# Patient Record
Sex: Male | Born: 1940 | Race: White | Hispanic: No | Marital: Married | Smoking: Former smoker
Health system: Southern US, Community
[De-identification: ages and names within clinical notes are randomized; demographics above are authoritative.]

## PROBLEM LIST (undated history)

## (undated) DIAGNOSIS — K2100 Gastro-esophageal reflux disease with esophagitis, without bleeding: Secondary | ICD-10-CM

## (undated) DIAGNOSIS — G4733 Obstructive sleep apnea (adult) (pediatric): Secondary | ICD-10-CM

## (undated) DIAGNOSIS — N183 Chronic kidney disease, stage 3 unspecified: Secondary | ICD-10-CM

## (undated) DIAGNOSIS — I255 Ischemic cardiomyopathy: Secondary | ICD-10-CM

## (undated) DIAGNOSIS — E785 Hyperlipidemia, unspecified: Secondary | ICD-10-CM

## (undated) DIAGNOSIS — G629 Polyneuropathy, unspecified: Secondary | ICD-10-CM

## (undated) DIAGNOSIS — Z9861 Coronary angioplasty status: Secondary | ICD-10-CM

## (undated) DIAGNOSIS — E118 Type 2 diabetes mellitus with unspecified complications: Secondary | ICD-10-CM

## (undated) DIAGNOSIS — I2510 Atherosclerotic heart disease of native coronary artery without angina pectoris: Secondary | ICD-10-CM

## (undated) DIAGNOSIS — F5101 Primary insomnia: Secondary | ICD-10-CM

## (undated) DIAGNOSIS — I10 Essential (primary) hypertension: Secondary | ICD-10-CM

## (undated) HISTORY — DX: Morbid (severe) obesity due to excess calories: E66.01

## (undated) HISTORY — DX: Primary insomnia: F51.01

## (undated) HISTORY — DX: Gastro-esophageal reflux disease with esophagitis: K21.0

## (undated) HISTORY — DX: Chronic kidney disease, stage 3 (moderate): N18.3

## (undated) HISTORY — DX: Hyperlipidemia, unspecified: E78.5

## (undated) HISTORY — PX: CARDIAC CATHETERIZATION: SHX172

## (undated) HISTORY — DX: Obstructive sleep apnea (adult) (pediatric): G47.33

## (undated) HISTORY — DX: Chronic kidney disease, stage 3 unspecified: N18.30

## (undated) HISTORY — DX: Gastro-esophageal reflux disease with esophagitis, without bleeding: K21.00

## (undated) HISTORY — DX: Essential (primary) hypertension: I10

## (undated) HISTORY — DX: Polyneuropathy, unspecified: G62.9

---

## 1998-03-05 ENCOUNTER — Ambulatory Visit (HOSPITAL_COMMUNITY): Admission: RE | Admit: 1998-03-05 | Discharge: 1998-03-06 | Payer: Self-pay | Admitting: Cardiology

## 1998-03-05 ENCOUNTER — Encounter: Payer: Self-pay | Admitting: Cardiology

## 1998-09-25 ENCOUNTER — Observation Stay (HOSPITAL_COMMUNITY): Admission: AD | Admit: 1998-09-25 | Discharge: 1998-09-26 | Payer: Self-pay | Admitting: Cardiology

## 2000-10-11 ENCOUNTER — Ambulatory Visit: Admission: RE | Admit: 2000-10-11 | Discharge: 2000-10-11 | Payer: Self-pay | Admitting: Family Medicine

## 2002-08-10 ENCOUNTER — Ambulatory Visit (HOSPITAL_COMMUNITY): Admission: RE | Admit: 2002-08-10 | Discharge: 2002-08-10 | Payer: Self-pay | Admitting: Cardiology

## 2004-05-18 ENCOUNTER — Ambulatory Visit: Payer: Self-pay | Admitting: Cardiology

## 2005-05-31 ENCOUNTER — Ambulatory Visit: Payer: Self-pay | Admitting: Cardiology

## 2005-06-14 ENCOUNTER — Ambulatory Visit: Payer: Self-pay | Admitting: Cardiology

## 2006-01-31 ENCOUNTER — Ambulatory Visit: Payer: Self-pay | Admitting: Cardiology

## 2006-02-18 ENCOUNTER — Ambulatory Visit: Payer: Self-pay | Admitting: Cardiology

## 2006-03-01 ENCOUNTER — Ambulatory Visit: Payer: Self-pay | Admitting: Cardiology

## 2006-05-25 ENCOUNTER — Ambulatory Visit: Payer: Self-pay | Admitting: Cardiology

## 2006-05-30 ENCOUNTER — Inpatient Hospital Stay (HOSPITAL_COMMUNITY): Admission: AD | Admit: 2006-05-30 | Discharge: 2006-05-31 | Payer: Self-pay | Admitting: Cardiology

## 2006-05-30 ENCOUNTER — Inpatient Hospital Stay (HOSPITAL_BASED_OUTPATIENT_CLINIC_OR_DEPARTMENT_OTHER): Admission: RE | Admit: 2006-05-30 | Discharge: 2006-05-30 | Payer: Self-pay | Admitting: Cardiology

## 2006-05-30 ENCOUNTER — Ambulatory Visit: Payer: Self-pay | Admitting: Cardiology

## 2006-06-16 ENCOUNTER — Ambulatory Visit: Payer: Self-pay | Admitting: Cardiology

## 2007-08-08 ENCOUNTER — Ambulatory Visit: Payer: Self-pay | Admitting: Cardiology

## 2007-09-08 ENCOUNTER — Ambulatory Visit: Payer: Self-pay | Admitting: Cardiology

## 2007-09-08 ENCOUNTER — Inpatient Hospital Stay (HOSPITAL_COMMUNITY): Admission: AD | Admit: 2007-09-08 | Discharge: 2007-09-10 | Payer: Self-pay | Admitting: Cardiology

## 2007-09-12 ENCOUNTER — Ambulatory Visit: Payer: Self-pay | Admitting: Cardiology

## 2007-09-12 ENCOUNTER — Ambulatory Visit (HOSPITAL_COMMUNITY): Admission: RE | Admit: 2007-09-12 | Discharge: 2007-09-12 | Payer: Self-pay | Admitting: Cardiology

## 2007-09-26 ENCOUNTER — Ambulatory Visit: Payer: Self-pay | Admitting: Cardiology

## 2008-08-15 ENCOUNTER — Ambulatory Visit: Payer: Self-pay | Admitting: Cardiology

## 2008-08-16 ENCOUNTER — Encounter: Payer: Self-pay | Admitting: Cardiology

## 2009-01-16 DIAGNOSIS — E782 Mixed hyperlipidemia: Secondary | ICD-10-CM | POA: Insufficient documentation

## 2009-01-16 DIAGNOSIS — E785 Hyperlipidemia, unspecified: Secondary | ICD-10-CM

## 2009-01-16 DIAGNOSIS — R0602 Shortness of breath: Secondary | ICD-10-CM | POA: Insufficient documentation

## 2009-01-16 DIAGNOSIS — E663 Overweight: Secondary | ICD-10-CM | POA: Insufficient documentation

## 2009-02-27 ENCOUNTER — Ambulatory Visit (HOSPITAL_COMMUNITY): Payer: Self-pay | Admitting: Psychology

## 2009-03-18 ENCOUNTER — Ambulatory Visit (HOSPITAL_COMMUNITY): Payer: Self-pay | Admitting: Psychology

## 2010-09-01 NOTE — Discharge Summary (Signed)
Noah [name], Noah [name]                ACCOUNT NO.:  [id number]   MEDICAL RECORD NO.:  [id number]          PATIENT TYPE:  INP   LOCATION:  4713                         FACILITY:  [facility]   PHYSICIAN:  Madolyn Frieze. Jens Som, MD, FACCDATE OF BIRTH:  July 24, 1940   DATE OF ADMISSION:  09/08/2007  DATE OF DISCHARGE:                         DISCHARGE SUMMARY - REFERRING   DISCHARGE DIAGNOSES:  1. Chest discomfort of uncertain etiology but without evidence of      acute cardiac ischemia by enzymes.  2. Hypertension.  3. Hyperglycemia with a history of diabetes, hyperlipidemia, history      as noted per history and physical dictated on Sep 08, 2007.   ATTENDING PHYSICIAN:  Dr. Andee Lineman.   DISCHARGING PHYSICIAN:  Dr. Jens Som.   HISTORY:  This is a 70 year old morbidly obese male who presents to [facility] with reoccurring chest discomfort on the morning of admission.  While sitting at work, he developed 7 out of 10 substernal chest  pressure associated with nausea, similar to his myocardial infarction  that he had in 1997 and 1999.  He denied associated shortness of breath,  diaphoresis or radiation.  He took over-the-counter antacids without  relief and drove home.  His discomfort resolved within 45 minutes after  onset.  He drove to [facility] for further evaluation.  EKG  showed chronic left bundle-branch block.  His medical history is  extensive.  Please refer to dictated H&P from Sep 08, 2007.   LABORATORY DATA:  At [facility],  H&H on the 23rd was 13 and 38.2,  normal indices.  Platelets 159, WBCs 7.8.  PTT 116, PT 13, D-dimer was  0.73, sodium 140, potassium 3.9, BUN 21, creatinine 1.19, glucose 130,  normal LFTs.  Albumin was slightly low at 3.3.  CK-MBs, relative  indexes, and troponins were within normal limits x3.  Fasting lipids  showed a total cholesterol of 121, triglycerides 92, HDL 28, LDL 75, and  TSH 1.736.  Chest x-ray at [facility] on the 23rd showed linear  opacity at the left lung base likely representative of atelectasis but  could not exclude early infiltrate, right lower lobe atelectasis,  central venous pulmonary congestion.  EKGs showed sinus rhythm, left  axis deviation, left bundle-branch block.   HOSPITAL COURSE:  Noah [name] was transferred from [facility] for  further evaluation and cardiac catheterization given his chest  discomfort and history.  He was transferred on the 22nd.  Overnight he  did not have any further problems.  Pharmacy assisted with heparin.  On  the 24th, he still had not had any chest discomfort or shortness of  breath.  Enzymes had ruled out myocardial infarction.  Dr. Jens Som on  reevaluating the patient on the 24th after being seen by Dr. Andee Lineman on  the 25th stating that potentially he could be discharged home with  outpatient catheterization on Tuesday.  Dr. Jens Som agreed.  Thus, the  patient was discharged home.   DISPOSITION:  Noah [name] is discharged home.  He is asked to curtail his  usual activities, particularly regards to lifting, driving and sexual  activity until his chest discomfort is further evaluated.  He was asked  to maintain a low-sodium, heart-healthy ADA diet and to return to the  emergency room for further discomfort.   HOME MEDICATIONS:  His home medications will remain the same.  These  include:  1. Aspirin 325 mg daily.  2. Plavix 75 mg daily.  3. Zocor 40 mg q.h.s.  4. Niacin 500 mg daily.  5. Toprol XL 200 mg daily.  6. Altace 5 mg daily.  7. Flomax 0.8 mg daily.  8. Lopid 600 mg b.i.d.  9. Glyburide 5 mg daily.  10.Multivitamin daily.  11.Fish oil 1 tablet b.i.d.  12.Zantac 150 q.h.s.  13.Actos 45 mg daily.  14.Lasix 60 mg daily.  15.Imdur 30 mg daily.  16.Coenzyme Q-10 daily.  17.Nitroglycerin as needed.   He is asked to report to short-stay section C at 9:00 a.m. for cardiac  catheterization and advised nothing to eat or drink after midnight  except for his  medications that he may take with water.  He was advised  to bring all medications to all appointments.   I have left outpatient pre-catheterization orders as well as his med  list within the cath lab for Ricky Ala to receive on Tuesday morning.  Discharge time 45 minutes.      Joellyn Rued, PA-C      Madolyn Frieze Jens Som, MD, [facility]  Electronically Signed    EW/MEDQ  D:  09/10/2007  T:  09/10/2007  Job:  [id number]   cc:   Gwynneth Munson, MD,FACC

## 2010-09-01 NOTE — H&P (Signed)
[name], [name]                ACCOUNT NO.:  [id number]   MEDICAL RECORD NO.:  [id number]          PATIENT TYPE:  INP   LOCATION:  4713                         FACILITY:  [facility]   PHYSICIAN:  Noah Rotunda, MD, FACCDATE OF BIRTH:  1940/05/31   DATE OF ADMISSION:  09/08/2007  DATE OF DISCHARGE:                              HISTORY & PHYSICAL   PRIMARY CARDIOLOGIST:  Noah Codding, MD, [facility]   PRIMARY CARE Noah [name]:  Noah [name], M.D.   PATIENT PROFILE:  A 70 year old morbidly obese male with prior history  of CAD status post multiple MIs who presents with recurrent chest pain.   PROBLEMS:  1. Unstable angina/coronary artery disease in January, 2008.  1A.  Status post myocardial infarction in 1997.  1B.  Status post myocardial infarction in 1999.  1C. Status post percutaneous coronary intervention of the left anterior  descending and right coronary artery with bare-metal stent to the  proximal mid left anterior descending and NIR bare-metal stents to the  proximal distal right coronary artery in 1999.  1D.  On May 30, 2006, cardiac catheterization, percutaneous  coronary intervention, left main normal, left anterior descending 10% in-  stent restenosis, left circumflex 90% mid.  Subsequently treated with a  3.5 x 15-mm endeavor drug-eluting stent, right coronary artery 50% mid  in-stent restenosis; 80% mid-to-distal (highly calcified; 40% distal in-  stent restenosis, ejection fraction 35-40%).  1. Ischemic cardiomyopathy, ejection fraction 35-40%.  2. Chronic systolic congestive heart failure.  3. Morbid obesity.  4. Hyperlipidemia/hypertriglyceridemia times approximately 12 years.  5. Diabetes mellitus with peripheral neuropathy in 1986.  6. Gastroesophageal reflux disease.  7. Hiatal hernia.  8. Benign prostatic hyperplasia.  9. Obstructive sleep apnea, on continuous positive airway pressure.  10.Chronic left bundle-branch block.  11.Mild chronic renal  insufficiency.  12.Remote tobacco abuse with a 50-pack-year history, quitting about 10      years ago.  13.Status post knee surgery.  14.Status post tonsillectomy.  15.Status post hernia repair.  16.Status post cyst removal.  17.Chronic dyspnea.  18.Osteoarthritis.   HISTORY OF PRESENT ILLNESS:  A 70 year old morbidly obese male with  history of CAD and MI in 1997 and 1999 with multiple stents to LAD, RCA  and most recently left circumflex in February 2008.  He also has a  history of hiatal hernia and GERD, and has chest pain, indigestion  approximately two times a month lasting 10 minutes resolving with  antacid therapy.  He was otherwise in his usual state of health for  approximately 10:30 this morning when he was sitting at work and  developed 7/10 substernal chest pressure with mild nausea, somewhat  similar to pain at the time of his MIs in 97 and 99.  He had no  shortness of breath, diaphoresis, or radiation.  He took over-the-  counter antacids without relief and then drove home.  While driving, his  pain resolved approximately 45 minutes after onset.  After getting home,  he drove to [facility], where he was pain free and cardiac  markers were negative x1.  ECG shows chronic left bundle-branch block.  No acute changes.  He has had no additional chest pain and is  transferred to [facility] for further evaluation.  Currently, his pain-free.   ALLERGIES:  ERYTHROMYCIN and CODEINE.   HOME MEDICATIONS:  1. Zocor 40 mg daily.  2. Niacin 500 mg daily.  3. Toprol-XL 200 mg daily.  4. Altace 5 mg daily.  5. Flomax 0.4 mg 2 tablets daily.  6. Gemfibrozil 600 mg b.i.d.  7. Glyburide 5 mg daily.  8. Lasix 60 mg daily.  9. Aspirin 325 mg daily.  10.Fish oil 1 tablet b.i.d.  11.Multivitamin daily.  12.Nexium 40 mg daily.  13.Zantac 150 mg nightly.  14.Plavix 75 mg daily.  15.Imdur 30 mg daily.  16.Coenzyme Q10 daily.  17.Actos 45 mg daily.   FAMILY HISTORY:  Mother is  alive at age 100 with valvular disease.  Father died at age 12 of an MI.  He has two brothers.  One has cancer,  they both have diabetes.  No CAD or stroke.   SOCIAL HISTORY:  He lives in [place] with his wife.  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  He has three grown children  and one Museum/gallery conservator.  He has about 50 pack-year history tobacco abuse  and quit cigarettes about 10 years ago.  Occasionally, will have a cigar  maybe about twice a year.  He has an occasional drink, may be once a  month.  Denies any drug use.  He exercises on a recumbent bike two to  three times per week for 20 minutes of the time.   REVIEW OF SYSTEMS:  Positive for chest pain, history of indigestion, and  GERD symptoms.  He also has a history diabetes as outlined above.  Otherwise, all systems reviewed and negative.  He is a full code.   PHYSICAL EXAMINATION:  VITAL SIGNS:  He is afebrile, heart rate 72,  respirations 18, blood pressure 126/68, pulse ox 99% on 2 liters.  GENERAL:  Pleasant obese white male in no acute distress.  Awake, alert,  and oriented x3.  HEENT:  Normal.  Nares grossly intact and nonfocal.  SKIN:  Warm and dry without lesions or masses.  NECK:  Obese and difficult to asses JVP.  No bruits.  LUNGS:  Respirations are unlabored.  Clear to auscultation.  CARDIAC:  Regular S1 and S2.  No S3 or S4 or murmurs.  ABDOMEN:  Obese, soft, nontender, and nondistended.  Bowel sounds  present.  EXTREMITIES:  Warm, dry, and pink.  No clubbing, cyanosis, or edema.  Dorsalis pedis and posterior tibialis 2+ and equal bilaterally.   DIAGNOSTIC DATA:  Chest x-ray is pending.  EKG shows sinus rhythm and  rate of 91 beats per minute, left axis deviation, and left bundle-branch  block.  No acute ST-T changes.   LABORATORY DATA:  Hemoglobin 14.5, hematocrit 43.1,WBC 6.8, platelets  198, sodium 137, potassium 4.0, chloride 97, CO2 26.1, BUN 23,  creatinine 1.2, glucose 166, calcium  9.5, INR 0.9, CK 130, MB 2.2, and  troponin-I less than 0.0.   ASSESSMENT AND PLAN:  1. Unstable angina/coronary artery disease symptoms concerning for      angina.  The patient feels that these are different from his usual      indigestion and similar to myocardial infarction pain.  Plan to      admit and cycle cardiac enzymes.  Add heparin and otherwise      continue aspirin, Plavix, statin, beta-blocker, ACE inhibitor, and  nitrate.  Plan cath on Tuesday or sooner if the patient has      recurrent pain.  2. Ischemic cardiomyopathy/chronic systolic congestive heart failure.      The patient euvolemic on exam.  He does not weigh himself at home.      He does require congestive heart failure education.  Continue beta-      blocker, ACE inhibitor, and Lasix.  3. Diabetes mellitus.  Continue home regimen.  Check CBGs and add      sliding scale insulin.  4. Gastroesophageal reflux disease and hiatal hernia.  Continue proton      pump inhibitor and H2 blocker.  5. Morbid obesity.  The patient is considering bariatric surgery at      some point.  Have him seen by cardiac rehab.  6. Benign prostatic hyperplasia.  Continue Flomax.  7. Obstructive sleep apnea.  Continue continuous positive airway      pressure.  8. History of mild renal insufficiency, creatinine appears stable at      1.2.      Noah [name], ANP      Noah Rotunda, MD, [facility]  Electronically Signed    CB/MEDQ  D:  09/08/2007  T:  09/09/2007  Job:  [id number]

## 2010-09-01 NOTE — Assessment & Plan Note (Signed)
[facility]                          [facility] OFFICE NOTE   NAME:[name], [name]                       MRN:          [id number]  DATE:09/26/2007                            DOB:          [birth year]    HISTORY OF PRESENT ILLNESS:  The patient is a 70 year old male with  morbid obesity and weakness secondary to deconditioning.  The patient  underwent a recent catheterization for worsening chest pain.  He was  found to have an LV function of 50%, which was improved from prior, and  also was found to have essentially nonobstructive disease with no  significant restenosis in prior stent sites.  The patient states he is  doing well.  He denies any chest pain, orthopnea, PND.  He has not had  palpitations or syncope.   MEDICATIONS:  1. Aspirin 325 mg p.o. daily.  2. Plavix 75 mg p.o. daily.  3. Zocor 40 mg p.o. daily.  4. Niacin 5 mg p.o. daily.  5. Toprol XL 20 mg p.o. daily.  6. Altace 5 mg p.o. daily.  7. Flomax 0.5 mg p.o. daily.  8. Lopid 600 mg p.o. b.i.d.  9. Glyburide 5 mg p.o. daily.  10.Multivitamins.  11.Fish oil.  12.Zantac.  13.Actos 45 mg p.o. daily.  14.Lasix 60 mg p.o. daily.  15.Imdur 10 mg p.o. daily.  16.Coenzyme Q10 daily.   PHYSICAL EXAMINATION:  VITAL SIGNS:  Blood pressure 150/71, heart rate  76, weight 338 pounds.  NECK:  Normal carotid strokes. No carotid bruits.  LUNGS:  Clear breath sounds bilaterally.  HEART:  Regular rate and rhythm.  Normal S1, S2.  No murmurs, rubs, or  gallops.  ABDOMEN:  Soft, nontender.  No rebound or guarding.  Good bowel sounds.  EXTREMITIES:  No cyanosis, clubbing or edema.  NEUROLOGIC:  Patient alert and grossly nonfocal.   PROBLEMS:  1. Exertional dyspnea secondary to obesity.  2. Coronary artery disease.      a.     Status post prior percutaneous coronary intervention.      b.     Recent catheterization with patent stent to the left       anterior descending and right coronary  artery.      c.     Near normal left ventricular function estimated at 50% by       catheterization.  3. Normalized renal function.  4. Type 2 diabetes mellitus.  5. Dyslipidemia.  6. Morbid obesity.  7. Remote tobacco use.   PLAN:  1. The patient has been cleared for bariatric surgery. Recent      catheterization showed nonobstructive coronary artery disease.  2. The patient's LV function also has markedly improved, and no      further adjustments in medications required.  3. The patient can follow up with [place] in 6 months.     Learta Codding, MD,FACC  Electronically Signed    GED/MedQ  DD: 09/26/2007  DT: 09/26/2007  Job #: [id number]

## 2010-09-01 NOTE — Assessment & Plan Note (Signed)
[facility]                          [facility] NOTE   NAME:[name], [name]                       MRN:          [id number]  DATE:08/08/2007                            DOB:          December 03, 1940    HISTORY OF PRESENT ILLNESS:  The patient is a 70 year old male, morbidly  obese with significant dyspnea and deconditioning, secondary to his  underlying obesity.  The patient does have coronary artery disease and  has been stented on multiple occasions.  He does fortunately, however  has no chest pain, and he has chronic dyspnea related to the above.  The  patient is now choosing bariatric surgery.   MEDICATIONS:  List is extensive and is listed in the chart.  He is  planning to have blood work done by Dr. Neita Carp tomorrow.  I pointed out  his elevated blood pressure, but he states that previously it has been  normal.  I have asked him to get a blood pressure cuff.   PROBLEMS:  1. Exertional dyspnea secondary to obesity.  2. Coronary artery disease.  3. LV dysfunction, ejection fraction 40%.  4. Mild renal insufficiency.  5. Chronic __________.  6. Type 2 diabetes mellitus.  7. Dyslipidemia.  8. Morbid obesity.  9. Remote tobacco use.   PLAN:  1. I told the patient that the best thing we could do for him right      now is to refer him for a evaluation for bariatric surgery.  2. From a cardiovascular standpoint, the patient is stable.  3. I asked the patient to check with Dr. Neita Carp regarding his blood      pressure and to obtain a blood pressure cuff, as he may need      additional treatment for hypertension.     Noah Codding, MD,FACC  Electronically Signed    GED/MedQ  DD: 08/08/2007  DT: 08/08/2007  Job #: [id number]

## 2010-09-01 NOTE — Assessment & Plan Note (Signed)
 [facility]                          [facility] NOTE   NAME:Noah [name], Noah [name]                       MRN:          [id number]  DATE:08/15/2008                            DOB:          1941/03/03    The patient is a 70 year old male with obesity and coronary artery  disease.  The patient is status post right coronary intervention.  The  patient was laid off from work.  He is somewhat depressed about this;  however, he does not have any chest pain or shortness of breath.  He  feels stable from a cardiovascular perspective.  He has had no angina.   MEDICATIONS:  1. Aspirin 325 mg p.o. daily.  2. Plavix 75 mg p.o. daily.  3. Zocor 40 mg p.o. daily.  4. Niacin 500 mg p.o. daily.  5. Altace 5 mg p.o. daily.  6. Flomax 0.8 mg p.o. daily.  7. Lopid 600 mg p.o. daily.  8. Glyburide 5 mg p.o. daily.  9. Multivitamin.  10.Fish oil.  11.Zantac 150 mg p.o. daily.  12.Actos 45 mg p.o. daily.  13.Lasix 60 mg p.o. daily.  14.Imdur 30 mg p.o. daily.  15.Coenzyme Q10.  16.Metoprolol 100 mg p.o. b.i.d.   PHYSICAL EXAMINATION:  VITAL SIGNS:  Blood pressure 150/81, heart rate  78, weight 240 pounds.  NECK:  Normal carotid upstroke.  No carotid bruits.  LUNGS:  Clear breath sounds bilaterally.  HEART:  Regular rate and rhythm.  Normal S1 and S2.  ABDOMEN:  Soft, nontender.  No rebound or guarding.  Good bowel sounds.  EXTREMITIES:  No cyanosis, clubbing, or edema.  NEUROLOGIC:  The patient is alert, oriented, and grossly nonfocal.   PROBLEMS:  1. Exertional dyspnea secondary to obesity.  2. Coronary artery disease.      a.     Status post right coronary intervention.      b.     Stable symptoms.  3. Recent catheterization last year with patent stents to left      anterior descending and right coronary artery.  4. Near normal left ventricular function.  Ejection fraction 50%.  5. Type 2 diabetes mellitus.  6. Dyslipidemia.  7. Morbid obesity.  8.  Tobacco use.   PLAN:  1. From cardiovascular perspective, the patient is stable.  He reports      no chest pain.  No exercise testing or functional testing is      needed.  2. I have made no changes in the patient's medical regimen and will      follow them up in 6 months.  His EKG was reviewed and demonstrates      a left bundle-branch block which is chronic.     Learta Codding, MD,FACC  Electronically Signed    GED/MedQ  DD: 08/15/2008  DT: 08/16/2008  Job #: [id number]

## 2010-09-04 NOTE — Assessment & Plan Note (Signed)
[facility]                            [facility] NOTE   NAME:Noah [name]                       MRN:          [id number]  DATE:03/01/2006                            DOB:          01/14/1941    HISTORY OF PRESENT ILLNESS:  The patient is a 70 year old male with known  coronary artery disease.  The patient is status post multiple percutaneous  interventions to LAD and right coronary artery.  His ejection fraction is  45%.  He was referred recently for a Dobutamine echocardiographic study but  the technician was unable to obtain good echocardiographic windows.  Baseline echocardiographic study did demonstrate possibly mild LV  dysfunction but was otherwise non-diagnostic.  The patient stated he has no  chest pain but he is very short of breath, has gained a lot of weight.  He  is trying to be on an exercise program.  He is also interested in see a  dietitian.  He also requested to discuss the possibility of gastric bypass  surgery versus banding.  He denies any orthopnea or PND.  He has no  pulsations or syncope.   MEDICATIONS:  1. Lopressor 10 mg p.o. q.a.m.  2. Niacin 500 mg p.o. q.a.m.  3. Toprol XL 200 mg p.o. q.a.m.  4. Altace 5 mg q.p.m.  5. Flomax 0.4.  6. Gemfibrozil.  7. Avandia.  8. Glyburide.  9. Lasix  10.Co-Enzyme Q10.  11.Fish oil.  12.Multivitamin.  13.Nexium.   PHYSICAL EXAMINATION:  VITAL SIGNS:  Blood pressure 115/78, heart rate 76  beats per minute. Weight 237 pounds.  NECK:  Normal carotid upstroke, no carotid bruits.  LUNGS:  Clear, breath sounds normal.  HEART:  Regular rate and rhythm, normal sinus.  ABDOMEN:  Soft.  EXTREMITIES:  No cyanosis, clubbing or edema.  NEURO:  The patient is alert and oriented, grossly nonfocal.   PROBLEM LIST:  1. Chronic exertional dyspnea.  2. Coronary artery disease.      a.     Moderate fixed inferior defect.  Ejection fraction 37% by       Cardiolite in May  2005.      b.     Status post inferior myocardial infarction.      c.     Status post multiple percutaneous interventions to LAD and right       coronary artery.      d.     Non-obstructive disease, ejection fraction 45% by       catheterization in April 2004.  3. History of mild renal insufficiency.  4. Chronic left bundle branch block.  5. Type 2 diabetes mellitus.  6. Hyperlipidemia.  7. Morbid obesity.  8. Remote tobacco use.   PLAN:  1. The patient is interested in seeing a dietitian.  We have arranged for      him to have this done in [place].  2. I have asked the patient to get a heart rate monitor and he will go on      the exercise bike and try to exercise at least 1/2 hour 3 times  a week.      I discouraged him from gastric surgery and asked him to make an attempt      at losing weight by cutting his carbs and seeing a dietitian and go on      the exercise bike.  3. The patient will need a repeat stress study and I have ordered an      Adenosine Cardiolite with 4 minute protocol.     Learta Codding, MD,FACC  Electronically Signed    GED/MedQ  DD: 03/01/2006  DT: 03/01/2006  Job #: [id number]

## 2010-09-04 NOTE — Assessment & Plan Note (Signed)
[facility]                            [facility]   NAME:Noah [name], Noah [name]                       MRN:          [id number]  DATE:01/31/2006                            DOB:          08-05-1940    REASON FOR OFFICE VISIT:  Scheduled 53-month followup.   Mr. Imbert is a 70 year old male with known coronary artery disease, who now  returns for scheduled followup.  Since last seen here in the clinic in  February of this year, by Dr. Andee Lineman, patient reports chronic exertional  dyspnea, but with no significant exacerbation.  However, he has gained a  significant amount of weight (up 16 pounds) and is currently considering  gastric banding versus gastric bypass surgery.  He has begun to make some  initial inquiries into this.   Although he is somewhat limited in his mobility, he does try to exercise,  and is currently using a stationary bike at home.  There is no associated  chest discomfort.  At other times, however, he does have occasional chest  pain which may be related to his known reflux disease.  These are not  strictly exertional, and there has been no increase in frequency of these as  well.   The patient's last stress test was an adenosine perfusion study in May 2005,  revealing a moderate fixed inferior defect with no definite ischemia;  calculated ejection fraction of 37%.   His last catheterization in 2004 showed nonobstructive coronary artery  disease.  Electrocardiogram today reveals NSR at 74 BPM with chronic left  bundle branch block.   CURRENT MEDICATIONS:  1. Crestor 10 q.d.  2. Niacin 500 q.d.  3. Toprol XL 200 q.d.  4. Altace 5 q.d.  5. Flomax 0.4 q.d.  6. Gemfibrozil 600 b.i.d.  7. Avandia 4 q.d.  8. Glyburide 5 q.d.  9. Lasix 40 q.d.  10.Coenzyme 210 one hundred q.d.  11.Quarter aspirin 325 q.d.  12.Fish oil 1 capsule b.i.d.  13.Nexium 40 q.d.   PHYSICAL EXAMINATION:  VITAL SIGNS:  Blood pressure 152/70.   Pulse 74,  regular.  Weight 334 (up 16 pounds).  GENERAL:  A 70 year old male, morbidly obese, sitting upright, in no  apparent distress.  NECK:  Palpable bilateral carotid pulses without bruits; unable to assess  JVD secondary to neck girth.  LUNGS:  Diminished breath sounds at the bases, but without crackles or  wheezes.  HEART:  Regular rate and rhythm (S1 and S2).  No significant murmurs.  ABDOMEN:  Markedly protuberant, non-tender.  EXTREMITIES:  Chronic 2+ bilateral lower extremity nonpitting edema.  NEUROLOGIC:  No focal deficits.   IMPRESSION:  1. Chronic exertional dyspnea.  2. Coronary artery disease.      a.     Moderate fixed inferior defect; no definite reversibility,       ejection fraction 37% May 2005.      b.     Status post prior inferior wall myocardial infarction.      c.     Status post multiple percutaneous interventions of the left  anterior descending and right coronary artery:  Nonobstructive       coronary artery disease with ejection fraction of 45% by cardiac       catheterization April 2004.  3. History of mild renal insufficiency.  4. Chronic left bundle branch block.  5. Type 2 diabetes mellitus.  6. Hyperlipidemia.  7. Morbid obesity.  8. Remote tobacco.   PLAN:  1. Schedule a dobutamine stress echocardiogram for reassessment of left      ventricular function and for risk stratification.  2. We will have patient return to our clinic in 1 month for followup of      these results and further recommendations.      ______________________________  Rozell Searing, PA-C    ______________________________  Learta Codding, MD,FACC    GS/MedQ  DD:  01/31/2006  DT:  02/01/2006  Job #:  [id number]   cc:   Fara Chute

## 2010-09-04 NOTE — Cardiovascular Report (Signed)
NAME:  Noah [name], Noah [name]                ACCOUNT NO.:  [id number]   MEDICAL RECORD NO.:  [id number]          PATIENT TYPE:  OIB   LOCATION:  1967                         FACILITY:  [facility]   PHYSICIAN:  Bruce R. Juanda Chance, MD, FACCDATE OF BIRTH:  1941/02/11   DATE OF PROCEDURE:  DATE OF DISCHARGE:  05/30/2006                            CARDIAC CATHETERIZATION   PAST MEDICAL HISTORY:  Noah [name] is 70 years old and has known coronary  disease with previous radial stent placed in the LAD and 2 NIR stents  placed in the mid and distal right coronary artery.  He also has  diabetes.  He usually has symptoms of shortness of breath on exertion  and was referred for evaluation with angiography.  This was the  performed downstairs, and we found what appeared to be a tight lesion in  the mid circumflex artery just before its bifurcation into a marginal  and posterolateral branch.  We brought him up to the cath lab for  intervention.  There was also a lesion in the mid right coronary artery,  which was intermediate, which we had eventually planned to evaluate with  IVUS.   PROCEDURES:  The procedure was performed through the right femoral  arterial sheath and a 6-French CLS4 guiding catheter with sideholes.  The patient was given Angiomax bolus and infusion.  He had been  previously loaded with clopidogrel and aspirin.  We navigated the  Prowater wire down the circumflex artery into the marginal branch first,  and then we navigated the second Prowater wire down the circumflex into  the AV branch which fed a posterolateral branch.  We then performed IVUS  of the lesion into the marginal branch with automatic pullback.  This  demonstrated a distal reference was about 3.5 to 3.75 and proximal  reference was 12.0.  The lesion was quite unusual.  At its tightest  point, which was right where the lesion bifurcated, it appeared 2.0 x  2.0.  Just proximal to that, it appeared that there was a shelf with  blood on  both sides of the shelf with the appearance of a ruptured  plaque.  We then went in with a 3.5 x 15-mm Quantum Maverick and  performed 2 inflations of 10 atmospheres for 30 seconds.  We then went  in with a 3.5 x 15-mm Endeavor stent and employed this as one inflation  of 12 atmospheres for 30 seconds.  We post dilated the stent with a 4.0  x 12-mm Quantum Maverick and performed 2 inflations up to 14 atmospheres  for 30 seconds.  Final diagnostic studies were then performed through  the guiding catheter.   We took additional diagnostic studies of the right coronary but elected  not to evaluate this with IVUS partly because the lesion was very  difficult to treat and partly because we thought it probably was not  significant and partly because the patient was very uncomfortable from  lying on the table for a fairly long period of time.   Although the patient had quite a bit of back pain, he otherwise  tolerated  the procedure well and left the cath lab in satisfactory  condition with the following results.   Initially, the stenosis at the mid circumflex artery was estimated at  90%; following stenting, it has improved to 0%.  The side branch which  was the AV circumflex artery and __________ posterolateral branch had  initially about 50% stenosis and was about 60% stenosis after stenting.   CONCLUSIONS:  Successful PCI of the lesion in the mid circumflex artery  using an Endeavor drug-eluting stent with improvement of the narrowing  from 90% to 0%.   DISPOSITION:  The patient returned to PCU  for further observation.  We  will plan Plavix for 1 year.      Bruce Elvera Lennox Juanda Chance, MD, [facility]  Electronically Signed     BRB/MEDQ  D:  05/30/2006  T:  05/31/2006  Job:  [id number]   cc:   Gwynneth Munson, MD,FACC

## 2010-09-04 NOTE — Cardiovascular Report (Signed)
NAME:  Noah [name], Noah [name]                ACCOUNT NO.:  [id number]   MEDICAL RECORD NO.:  [id number]          PATIENT TYPE:  OIB   LOCATION:  NA                           FACILITY:  [facility]   PHYSICIAN:  Bruce R. Juanda Chance, MD, FACCDATE OF BIRTH:  06-16-1940   DATE OF PROCEDURE:  05/30/2006  DATE OF DISCHARGE:                            CARDIAC CATHETERIZATION   PRIMARY CARE PHYSICIAN:  Dr. Fara Chute in [place].   CARDIOLOGIST:  Dr. Lewayne Bunting in [place].   CLINICAL HISTORY:  Noah [name] is 70 years old and has known coronary  artery disease and has had a Radius stent placed to the LAD and two near  stents placed in the mid and distal right coronary artery.  He recently  developed increasing symptoms of shortness of breath.  He was seen in  consultation by Dr. Andee Lineman who arranged for him to come to the  outpatient laboratory for evaluation with angiography.  He is also  diabetic.  He also has gained quite a bit of weight over the last year  or two and weighs 330 pounds.   PROCEDURE:  The procedure was performed via the right femoral artery  using arterial sheath and 5-French preformed coronary catheters.  We  used a JL-5 for injection of the left coronary artery, and we used a  right bypass graft catheter for injection of the right coronary artery.  After completion of the left heart catheterization, we decided to a  right heart catheterization as well.  This was performed via the right  femoral vein using a venous sheath and Swan-Ganz thermodilation  catheter.  The patient tolerated the procedure well and left the  laboratory in satisfactory condition.   RESULTS:  The left main coronary:  The left main coronary artery is free  of significant disease.   The left anterior descending artery:  The left anterior descending  artery gave rise to a large diagonal branch and two septal perforators.  The LAD was irregular, but there was no major obstruction.  The stent in  the proximal LAD had less than  10% narrowing.   Circumflex coronary artery:  The circumflex coronary artery gave rise to  an atrial branch, a marginal branch, and two large posterolateral  branches.  There was 90% stenosis just before the bifurcation into two  large posterolateral branches in the mid- circumflex artery.  There was  50% narrowing just past the ostium of the AV branch after the first  posterolateral branch and 40% narrowing in the second posterolateral  branch.   The right coronary artery:  The right coronary artery is a moderate-  sized vessel that was heavily calcified.  The right coronary gave rise  to two ventricle branches, a posterior descending branch, and two small  posterolateral branches.  There was 50% narrowing within the stent in  the mid-right coronary.  It was difficult to see for sure where the  stent was located because of severe calcification in the artery.  There  was an 80% lesion in the mid-to-distal vessel which appeared to be  outside of the stent, although  there was calcium here, too making it  difficult to visualize the stent.  The stent in the distal vessel had  40% narrowing.  There were tandem 50%  stenoses in the posterior  descending branch.   Left ventriculogram:  The left ventriculogram was performed in the RAO  projection showed global hypokinesis with an estimated fraction of 35-  40%.   HEMODYNAMIC DATA:  The right atrial pressure was 17 mean.  The pulmonary  artery pressure was 52/23 with a mean of 38.  Pulmonary wedge pressure  was 22 mean.  Left ventricle pressure was 143/24.  The aortic pressure  143/67 with mean of 94.  Cardiac output/cardiac index was 8.2/3.3  liters/minutes/sq. m. by Noah [name].   CONCLUSION:  1. Significant coronary artery disease with 90% stenosis in the mid-      circumflex artery, 40 and 50% stenoses in the second posterolateral      branch of the circumflex artery, less than 10% narrowing at the      stent site in the proximal left  anterior descending artery,  50%      narrowing in the stent in the mid-right coronary, 40% narrowing      within the stent in the distal right coronary, 80% narrowing in the      right coronary in the mid-to-distal portion with global left      ventricular hypokinesis and an estimated ejection fraction of 35-      40% and  an elevated pulmonary wedge pressure of 22.   RECOMMENDATIONS:  The lesion in the circumflex artery appears fairly  tight, and the lesion in the right coronary artery could also be flow-  limiting.  We did not use too much contrast, and the patient is anxious  to get the procedure done today, so we will plan to move the patient  upstairs and do PCI of the circumflex and evaluate the right coronary  with IVUS.  His shortness of breath may be multifactorial.  He does have  global hypokinesis and an elevated wedge pressure and markedly excess  weight which all could contribute to his shortness of breath as well as  ischemia.      Bruce Elvera Lennox Juanda Chance, MD, [facility]  Electronically Signed     BRB/MEDQ  D:  05/30/2006  T:  05/30/2006  Job:  [id number]   cc:   Gwynneth Munson, MD,FACC

## 2010-09-04 NOTE — Cardiovascular Report (Signed)
NAME:  Noah [name], Noah [name]                          ACCOUNT NO.:  [id number]   MEDICAL RECORD NO.:  [id number]                   PATIENT TYPE:  OIB   LOCATION:  2899                                 FACILITY:  [facility]   PHYSICIAN:  Charlies Constable, M.D.                  DATE OF BIRTH:  06-May-1940   DATE OF PROCEDURE:  08/10/2002  DATE OF DISCHARGE:                              CARDIAC CATHETERIZATION   CLINICAL HISTORY:  The patient is 70 years old and has had previous  interventions on the LAD and right coronary artery, the last of which were  in 1999.  It was from the other radial stent placed in the proximal to mid  LAD, and 2 NIR stents in the proximal and distal right coronary artery.  He  has done fairly well since that time.  He has had some chest pain, but it  was not clearly exertional or clearly anginal.  He had a Cardiolite scan by  Dr. Diona Browner which showed an ejection fraction of 36% and inferior akinesis.  Because of a borderline scan and some symptoms, Dr. Diona Browner arranged for  him to come down for a followup angiography.   DESCRIPTION OF PROCEDURE:  The procedure was performed via the right femoral  artery using an arterial sheath and 6-French preformed coronary catheters.  A front wall arterial puncture was performed, and Omnipaque contrast was  used.  A distal aortogram was performed to rule out abdominal aortic  aneurysm.  The patient tolerated the procedure well and left the laboratory  in satisfactory condition.  He was enrolled in the Matrix trial but was  randomized to manual compression.   RESULTS:  1. Left main:  The left main coronary artery was free of significant     disease.  2. Left anterior descending artery:  The left anterior descending artery     gave rise to a diagonal branch and several septal perforators.  There was     less than 20% narrowing at the stent site in the mid LAD.  3. Left circumflex artery:  The left circumflex artery gave rise to a  marginal branch, a second marginal branch, and a posterolateral branch.     There was 30% narrowing after the first marginal branch.  There was 50%     narrowing after the second marginal branch, and then tandem 50% stenoses     in the proximal portion of the posterolateral branch.  4. Right coronary artery:  The right coronary artery was a moderate-sized     vessel that gave rise to 2 right ventricular branches, a posterior     descending branch, and 3 posterolateral branches.  There was less than     20% narrowing at the stent site in the proximal vessel.  There was 30%     narrowing in the proximal vessel, proximal to the stent site, and 30%  narrowing in a mid vessel distal to the stent site.  There was only 20%     narrowing at the stent in the distal right coronary artery.  5. Left ventriculogram:  The left ventriculogram, performed in the RAO     projection, showed some mild global hypokinesis.  The infero basilar wall     was more severely hypokinetic.  The estimated ejection fraction was 45%.   PRESSURES:  1. The aortic pressure was 131/66 with a mean of 92.  2. The left ventricular pressure was 131/14.   CONCLUSIONS:  Coronary artery disease status post previous percutaneous  coronary interventions, as described above, with less than 20% narrowing at  the stent site in the mid left anterior descending artery; 30% proximal, 50%  mid, and 50% distal stenosis in the circumflex artery; less than 20%  narrowing in the stent site in the proximal right coronary artery with 30%  narrowing in the proximal and 30% narrowing in the mid right coronary  artery; and 20% in the stent at the stent site in the distal right coronary  artery, with mild global hypokinesis, and an estimated ejection fraction of  45.   DISPOSITION:  The patient has no source of ischemia.  I think medical  management with continued secondary risk factor modification is indicated.  The patient had worse LV  function a number of years ago and had been  enrolled in the Madit trial and was randomized to no defibrillator.  His  ejection fraction by Cardiolite was 36% and by angiogram today was  approximately 45%.  I spoke with Dr. Ladona Ridgel about the patient.  It appears  that there is no indication for either ICD or biventricular pacer.  Will  plan to let the patient go home as an outpatient, and follow up with Dr.  Diona Browner, and he can decide if he would like to formally followup as an  outpatient.                                               Charlies Constable, M.D.    BB/MEDQ  D:  08/10/2002  T:  08/12/2002  Job:  [id number]   cc:   Fara Chute  [name] [place]  [place] [address]  Fax: [phone]   Jonelle Sidle, M.D. [facility]   [facility]

## 2010-09-04 NOTE — Assessment & Plan Note (Signed)
[facility]                          [facility] NOTE   NAME:[name], [name]                       MRN:          [id number]  DATE:06/16/2006                            DOB:          1940-07-20    HISTORY OF PRESENT ILLNESS:  The patient is a 70 year old male with  history of known coronary artery disease.  The patient is status post  multiple percutaneous coronary interventions of the LAD and right  coronary artery.  His ejection fraction is 40%.  The patient was  recently seen due to increased dyspnea.  He was referred for an  outpatient cardiac catheterization and was found to have significant  disease of the circumflex coronary artery.  A Cypher stent was placed by  Dr. Juanda Chance.  The patient was placed on Plavix.  He also had elevated  right heart pressures and an increase was made in his Lasix regimen from  40 mg to 60 mg p.o. daily.   The patient now presents for followup to the office.  He stated that he  has had no complications in relationship to his right groin.  He stated  that he feels much improved.  He has less shortness of breath.  He  continues to lose weight and is working on his exercise program.  He has  had no recurrent substernal chest pain.  He has been compliant with his  medications and has been placed on Plavix.   MEDICATIONS:  1. Crestor 10 mg p.o. q.a.m.  2. Niaspan 5 mg p.o. q.a.m.  3. Toprol-XL 200 mg p.o. q.a.m.  4. Altace 5 mg p.o. q.p.m.  5. Flomax 0.4 mg p.o. daily.  6. Gemfibrozil.  7. Avandia.  8. Glyburide.  9. Lasix 60 mg p.o. q.a.m.  10.Enteric-coated aspirin 325 daily.  11.Fish oil.  12.Multivitamin.  13.Nexium.  14.Zantac 150 daily.  15.Plavix 75 mg p.o. daily.  16.Imdur 30 mg p.o. daily.   The latter medication he cannot take at higher dose due to headaches.   PHYSICAL EXAMINATION:  VITAL SIGNS:  Blood pressure 159/85, heart rate  75 beats per minute, weight is 335 pounds.  NECK:   Normal carotid upstroke, no carotid bruits.  LUNGS:  Clear breath sounds bilaterally.  HEART:  Regular rate and rhythm, normal S1 and S2.  No murmurs, rubs or  gallops.  ABDOMEN:  Soft, nontender.  No rebound, no guarding.  Good bowel sounds.  EXTREMITIES:  No cyanosis, clubbing or edema.  NEUROLOGIC:  The patient alert and oriented and grossly nonfocal.   PROBLEM LIST:  1. Exertional dyspnea secondary to anginal equivalent.  2. Coronary artery disease.      a.     Status post percutaneous coronary intervention and stent to       the left anterior descending artery and right coronary artery.      b.     Status post circumflex stent with drug-eluting platform       several weeks ago.  3. 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  4. Mild renal insufficiency.  5. Chronic left bundle-branch block.  6. Type  2 diabetes mellitus.  7. Dyslipidemia.  8. Morbid obesity.  9. Remote tobacco use.   PLAN:  1. The patient's shortness of breath appears to be his angina      equivalent.  He is now much better.  He can continue on Plavix for      at least 1 year.  2. Will continue on the higher dose of Lasix at 60 mg p.o. daily.      Will check a BMET today to make sure the patient does not have      worsening renal insufficiency.  3. The patient can follow up with [place] in 6 months.     Learta Codding, MD,FACC  Electronically Signed    GED/MedQ  DD: 06/16/2006  DT: 06/16/2006  Job #: [id number]   cc:   Fara Chute

## 2010-09-04 NOTE — Discharge Summary (Signed)
NAME:  Noah [name], Noah [name]                ACCOUNT NO.:  [id number]   MEDICAL RECORD NO.:  [id number]          PATIENT TYPE:  OIB   LOCATION:  6532                         FACILITY:  [facility]   PHYSICIAN:  Bruce R. Juanda Chance, MD, FACCDATE OF BIRTH:  Oct 29, 1940   DATE OF ADMISSION:  05/30/2006  DATE OF DISCHARGE:  05/31/2006                               DISCHARGE SUMMARY   PROCEDURES:  1. Cardiac catheterization.  2. Coronary arteriogram.  3. Left ventriculogram.  4. Percutaneous transluminal coronary angioplasty and drug-eluting      stent to the circumflex.  5. Intravascular ultrasound.   PRIMARY DIAGNOSIS:  Unstable anginal pain/dyspnea.   SECONDARY DIAGNOSES:  1. Status post inferior myocardial infarction with stents x2 to the      right coronary artery.  2. Status post percutaneous intervention and stent to the left      anterior descending artery.  3. Ischemic cardiomyopathy with an ejection fraction of 35-40% and      global hypokinesis at catheterization this admission  4. Morbid obesity.  5. Gastroesophageal reflux disease.  6. Hyperlipidemia with hypertriglyceridemia.  7. Diabetes.  8. Benign prostatic hypertrophy.  9. History of obstructive sleep apnea on CPAP.  10.Left bundle branch block on ECG.  11.History of mild renal insufficiency with a BUN and creatinine of      18/1.23 at discharge.  12.Remote history of tobacco use.  13.History of ventilator-dependent respiratory failure secondary to      PNA.  14.History of knee surgery.  15.Family history of coronary artery disease in his father.  16.Status post tonsillectomy, hernia repair and cyst removal.   Time at discharge 42 minutes.   HOSPITAL COURSE:  Mr. Boeckman is a 70 year old male with known coronary  artery disease.  He went to [facility] on May 24, 2006 for  chest pain.  His initial cardiac enzymes were negative, and he was  evaluated by cardiology.  It was felt that he needed further evaluation  and  catheterization, and he was transferred to [facility] on  May 30, 2006.   The cardiac catheterization showed less than 10% in-stent restenosis in  the LAD and approximately 50% in-stent restenosis in the proximal RCA,  40% in-stent restenosis in the distal RCA.  He had an isolated 80%  lesion in the RCA and in the PLA as well as in the distal circumflex.  He had a 40 and 50% stenosis.  However, in the proximal circumflex,  there was a 90% stenosis which Dr. Juanda Chance felt should be treated with  PTCA and a drug-eluting stent.  This reduced the stenosis to zero.  He  tolerated the procedure well.  His EF was 35-40% with global  hypokinesis.   Dr. Juanda Chance felt his Lasix should be increased, and it was increased from  40 to 60 mg a day.  He is to continue on Imdur at 30 mg a day.  His  postprocedure CK-MB and troponin were negative.  His BUN and creatinine  were stable.  He was seen by cardiac rehab and ambulated without chest  pain or shortness of  breath.  Pending evaluation by Dr. Juanda Chance, he is  tentatively considered stable for discharge with outpatient follow-up  arranged.   DISCHARGE INSTRUCTIONS:  His activity level is to be increased  gradually.  He is to call our office for problems with the  catheterization site.  He is to stick to a low-sodium diabetic diet.  He  is to follow up with Dr. Andee Lineman on February 27 at 12:30.  He is to  follow up with Dr. Neita Carp as needed.   DISCHARGE MEDICATIONS:  1. Toprol XL 200 mg a day.  2. Lopid 600 mg b.i.d.  3. Crestor 10 mg a day.  4. Altace 5 mg a day.  5. Slow-Mag 0.8 mg daily.  6. Avandia 4 mg a day.  7. Glyburide 5 mg a day.  8. Nexium 40 mg a day.  9. Zantac 150 mg a day.  10.Furosemide 40 mg 1-1/2 tablets daily.  11.Naprosyn 500 mg a day.  12.Fish oil as prior to admission.  13.Plavix 75 mg daily for at least a year.  14.Coated aspirin 325 mg daily.  15.Nitroglycerin sublingual p.r.n.  16.Coenzyme Q10, melatonin and  vitamins as prior to admission.  17.Lunesta and Xanax p.r.n.  18.Imdur 30 mg a day.      Theodore Demark, PA-C      Bruce R. Juanda Chance, MD, [facility]  Electronically Signed    RB/MEDQ  D:  05/31/2006  T:  05/31/2006  Job:  [id number]   cc:   [facility]  Fara Chute

## 2011-01-13 LAB — CBC
Hemoglobin: 13
Platelets: 159
RDW: 13.7
WBC: 7.8

## 2011-01-13 LAB — COMPREHENSIVE METABOLIC PANEL
ALT: 12
AST: 18
Albumin: 3.3 — ABNORMAL LOW
Alkaline Phosphatase: 42
BUN: 21
CO2: 23
Calcium: 9.4
Chloride: 103
Creatinine, Ser: 1.19
GFR calc Af Amer: >60
GFR calc non Af Amer: >60
Glucose, Bld: 130 — ABNORMAL HIGH
Potassium: 3.9
Sodium: 140
Total Bilirubin: 0.5
Total Protein: 6.2

## 2011-01-13 LAB — CARDIAC PANEL(CRET KIN+CKTOT+MB+TROPI)
CK, MB: 1.9
CK, MB: 2.2
CK, MB: 2.7
Relative Index: 1.8
Relative Index: 1.9
Total CK: 106
Total CK: 144
Total CK: 146
Troponin I: 0.01
Troponin I: 0.01
Troponin I: 0.01

## 2011-01-13 LAB — LIPID PANEL
LDL Cholesterol: 75
Total CHOL/HDL Ratio: 4.3
Triglycerides: 92
VLDL: 18

## 2011-01-13 LAB — PROTIME-INR
INR: 1
Prothrombin Time: 13

## 2011-01-13 LAB — APTT: aPTT: 116 — ABNORMAL HIGH

## 2011-05-11 DIAGNOSIS — IMO0001 Reserved for inherently not codable concepts without codable children: Secondary | ICD-10-CM | POA: Diagnosis not present

## 2011-05-11 DIAGNOSIS — E7800 Pure hypercholesterolemia, unspecified: Secondary | ICD-10-CM | POA: Diagnosis not present

## 2011-05-11 DIAGNOSIS — N400 Enlarged prostate without lower urinary tract symptoms: Secondary | ICD-10-CM | POA: Diagnosis not present

## 2011-05-11 DIAGNOSIS — I10 Essential (primary) hypertension: Secondary | ICD-10-CM | POA: Diagnosis not present

## 2011-05-17 DIAGNOSIS — G4733 Obstructive sleep apnea (adult) (pediatric): Secondary | ICD-10-CM | POA: Diagnosis not present

## 2011-05-17 DIAGNOSIS — E7800 Pure hypercholesterolemia, unspecified: Secondary | ICD-10-CM | POA: Diagnosis not present

## 2011-05-17 DIAGNOSIS — IMO0001 Reserved for inherently not codable concepts without codable children: Secondary | ICD-10-CM | POA: Diagnosis not present

## 2011-05-17 DIAGNOSIS — I10 Essential (primary) hypertension: Secondary | ICD-10-CM | POA: Diagnosis not present

## 2011-05-17 DIAGNOSIS — I209 Angina pectoris, unspecified: Secondary | ICD-10-CM | POA: Diagnosis not present

## 2011-05-17 DIAGNOSIS — F411 Generalized anxiety disorder: Secondary | ICD-10-CM | POA: Diagnosis not present

## 2011-05-17 DIAGNOSIS — K2100 Gastro-esophageal reflux disease with esophagitis, without bleeding: Secondary | ICD-10-CM | POA: Diagnosis not present

## 2011-05-17 DIAGNOSIS — I509 Heart failure, unspecified: Secondary | ICD-10-CM | POA: Diagnosis not present

## 2011-09-06 DIAGNOSIS — E7800 Pure hypercholesterolemia, unspecified: Secondary | ICD-10-CM | POA: Diagnosis not present

## 2011-09-06 DIAGNOSIS — I10 Essential (primary) hypertension: Secondary | ICD-10-CM | POA: Diagnosis not present

## 2011-09-06 DIAGNOSIS — E119 Type 2 diabetes mellitus without complications: Secondary | ICD-10-CM | POA: Diagnosis not present

## 2011-09-15 DIAGNOSIS — IMO0001 Reserved for inherently not codable concepts without codable children: Secondary | ICD-10-CM | POA: Diagnosis not present

## 2011-09-15 DIAGNOSIS — I509 Heart failure, unspecified: Secondary | ICD-10-CM | POA: Diagnosis not present

## 2011-09-15 DIAGNOSIS — E7800 Pure hypercholesterolemia, unspecified: Secondary | ICD-10-CM | POA: Diagnosis not present

## 2011-09-15 DIAGNOSIS — G4733 Obstructive sleep apnea (adult) (pediatric): Secondary | ICD-10-CM | POA: Diagnosis not present

## 2011-09-15 DIAGNOSIS — I209 Angina pectoris, unspecified: Secondary | ICD-10-CM | POA: Diagnosis not present

## 2011-09-15 DIAGNOSIS — I10 Essential (primary) hypertension: Secondary | ICD-10-CM | POA: Diagnosis not present

## 2011-10-27 DIAGNOSIS — E119 Type 2 diabetes mellitus without complications: Secondary | ICD-10-CM | POA: Diagnosis not present

## 2012-01-10 DIAGNOSIS — I509 Heart failure, unspecified: Secondary | ICD-10-CM | POA: Diagnosis not present

## 2012-01-10 DIAGNOSIS — I10 Essential (primary) hypertension: Secondary | ICD-10-CM | POA: Diagnosis not present

## 2012-01-10 DIAGNOSIS — N400 Enlarged prostate without lower urinary tract symptoms: Secondary | ICD-10-CM | POA: Diagnosis not present

## 2012-01-10 DIAGNOSIS — IMO0001 Reserved for inherently not codable concepts without codable children: Secondary | ICD-10-CM | POA: Diagnosis not present

## 2012-01-10 DIAGNOSIS — E119 Type 2 diabetes mellitus without complications: Secondary | ICD-10-CM | POA: Diagnosis not present

## 2012-01-10 DIAGNOSIS — Z23 Encounter for immunization: Secondary | ICD-10-CM | POA: Diagnosis not present

## 2012-01-10 DIAGNOSIS — E7800 Pure hypercholesterolemia, unspecified: Secondary | ICD-10-CM | POA: Diagnosis not present

## 2012-01-12 DIAGNOSIS — I509 Heart failure, unspecified: Secondary | ICD-10-CM | POA: Diagnosis not present

## 2012-01-12 DIAGNOSIS — G4733 Obstructive sleep apnea (adult) (pediatric): Secondary | ICD-10-CM | POA: Diagnosis not present

## 2012-01-12 DIAGNOSIS — IMO0001 Reserved for inherently not codable concepts without codable children: Secondary | ICD-10-CM | POA: Diagnosis not present

## 2012-01-12 DIAGNOSIS — I10 Essential (primary) hypertension: Secondary | ICD-10-CM | POA: Diagnosis not present

## 2012-01-12 DIAGNOSIS — E7800 Pure hypercholesterolemia, unspecified: Secondary | ICD-10-CM | POA: Diagnosis not present

## 2012-05-19 DIAGNOSIS — I10 Essential (primary) hypertension: Secondary | ICD-10-CM | POA: Diagnosis not present

## 2012-05-19 DIAGNOSIS — E7800 Pure hypercholesterolemia, unspecified: Secondary | ICD-10-CM | POA: Diagnosis not present

## 2012-05-19 DIAGNOSIS — IMO0001 Reserved for inherently not codable concepts without codable children: Secondary | ICD-10-CM | POA: Diagnosis not present

## 2012-05-24 DIAGNOSIS — IMO0001 Reserved for inherently not codable concepts without codable children: Secondary | ICD-10-CM | POA: Diagnosis not present

## 2012-05-24 DIAGNOSIS — E119 Type 2 diabetes mellitus without complications: Secondary | ICD-10-CM | POA: Diagnosis not present

## 2012-05-24 DIAGNOSIS — I509 Heart failure, unspecified: Secondary | ICD-10-CM | POA: Diagnosis not present

## 2012-05-24 DIAGNOSIS — G4733 Obstructive sleep apnea (adult) (pediatric): Secondary | ICD-10-CM | POA: Diagnosis not present

## 2012-05-24 DIAGNOSIS — E7800 Pure hypercholesterolemia, unspecified: Secondary | ICD-10-CM | POA: Diagnosis not present

## 2012-05-24 DIAGNOSIS — I10 Essential (primary) hypertension: Secondary | ICD-10-CM | POA: Diagnosis not present

## 2012-05-31 ENCOUNTER — Ambulatory Visit: Payer: Self-pay | Admitting: Internal Medicine

## 2012-06-06 ENCOUNTER — Ambulatory Visit (INDEPENDENT_AMBULATORY_CARE_PROVIDER_SITE_OTHER): Payer: Medicare Other | Admitting: Internal Medicine

## 2012-06-06 ENCOUNTER — Encounter: Payer: Self-pay | Admitting: Internal Medicine

## 2012-06-06 VITALS — BP 100/60 | HR 87 | Temp 97.6°F | Resp 14 | Ht 68.0 in | Wt 372.0 lb

## 2012-06-06 DIAGNOSIS — E118 Type 2 diabetes mellitus with unspecified complications: Secondary | ICD-10-CM

## 2012-06-06 MED ORDER — GLUCOSE BLOOD VI STRP: ORAL_STRIP | Status: DC

## 2012-06-06 NOTE — Progress Notes (Signed)
Subjective:     Patient ID: Noah [name], male   DOB: Sep 01, 1940, 72 y.o.   MRN: [id number]  HPI Noah [name] is a 72 year old man referred by his PCP, Dr. Neita Carp, for management of DM2,  non-insulin-dependent, uncontrolled, with complications (CAD, CHF, CKD 3).  Patient has been diagnosed with diabetes in 1987. He is now on the regimen of: - Actos 45 mg daily - Glipizide XL 10 mg twice a day  - Cinnamon 1000 mg x2 He was on Metformin in the past. He tried Januvia and it did not work at all. He tried it for 3 month. He tried Byetta, then Victoza (1.2 mg daily), but he says these did not work.  He does not check his sugars frequently, usually 1-2 a week.  - am: 170-200 - 2h post lunch: 200  He had one low (did not check his sugars then) in last 6 months. He has hypoglycemia awareness at 80 .  Diet: - Breakfast steak biscuit, orange juice, coffee - Lunch: sandwhich, water or diet drink -  Dinner: hamburger steak with vegetables - snacks: Popcorn-small bag, one slice pound cake  He is not writing to sugars down. His last hemoglobin A1c (per records received from Dr. Neita Carp) was 9.7%, on 05/19/2012. At that time, his creatinine was 1.5, and BUN was 27, for a GFR of 46. The rest of his CMP was normal, except glucose of 198, chloride slightly low at 96. A lipid panel showed 157/169/37/86.  Last eye exam: 10 mo ago, reportedly no DR.   Name Route Sig  . ALPRAZolam (XANAX) 0.5 MG tablet  Oral  Take 0.5 mg by mouth at bedtime as needed for sleep.   Marland Kitchen aspirin 325 MG EC tablet  Oral  Take 325 mg by mouth daily.   Marland Kitchen CINNAMON PO  Oral  Take 1,000 mg by mouth.   . Coenzyme Q10 (COQ10) 100 MG CAPS  Oral  Take 1 capsule by mouth every evening.   Marland Kitchen FLUoxetine (PROZAC) 20 MG capsule  Oral  Take 20 mg by mouth every evening.   . furosemide (LASIX) 40 MG tablet  Oral  Take 40 mg by mouth daily. Take one and half pills q am   . gemfibrozil (LOPID) 600 MG tablet  Oral  Take 600 mg by  mouth 2 (two) times daily before a meal.   . glipiZIDE (GLUCOTROL) 10 MG tablet  Oral  Take 10 mg by mouth 2 (two) times daily.   . isosorbide dinitrate (ISORDIL) 30 MG tablet  Oral  Take 30 mg by mouth every evening.   . Melatonin 5 MG TABS  Oral  Take 1 tablet by mouth at bedtime.   Marland Kitchen METOPROLOL SUCCINATE ER PO  Oral  Take by mouth. Take one 100mg   2 x daily   . Multiple Vitamin (MULTIVITAMIN) capsule  Oral  Take 1 capsule by mouth every evening.   . niacin 500 MG tablet  Oral  Take 500 mg by mouth daily with breakfast.   . Omega-3 Fatty Acids (FISH OIL) 1000 MG CAPS  Oral  Take 1 capsule by mouth 2 (two) times daily.   Marland Kitchen omeprazole (PRILOSEC) 20 MG capsule  Oral  Take 20 mg by mouth daily.   . pioglitazone (ACTOS) 45 MG tablet  Oral  Take 45 mg by mouth daily.   . ranitidine (ZANTAC) 150 MG tablet  Oral  Take 150 mg by mouth at bedtime.   . simvastatin (ZOCOR) 40 MG tablet  Oral  Take 40 mg by mouth daily.   . Tamsulosin HCl (FLOMAX) 0.4 MG CAPS  Oral  Take 0.8 mg by mouth every evening.   . zolpidem (AMBIEN) 10 MG tablet  Oral  Take 10 mg by mouth at bedtime as needed for sleep.   Marland Kitchen glucose blood (ONE TOUCH ULTRA TEST) test strip    Check your sugars twice a day   . ramipril (ALTACE) 10 MG tablet  Oral  Take 10 mg by mouth daily.    FH: Patient has a family history of diabetes mellitus in maternal grandfather and brother. He also has some history of heart disease in his father who died at 60. He has 2 brothers with hypertension and a brother with atrial fibrillation.  Social history:  He is married, he has 3 children, he is Doctor, general practice, he quit smoking in 1999, he does not chew tobacco, he does not smoke cigars or pipes. He drinks one drink per month (mixed drink). He does not use any recreational drugs. He exercises once a week by pedaling on his stationary bike.  PMH: The patient also has a history of obstructive sleep  apnea, on CPAP; anxiety/depression; hyperlipidemia (on Lopid, niacin, fish oil, coenzyme Q); hypertension; reflux esophagitis; GERD. He was seeing Dr. Charlies Constable. Last visit with cardiology was 3 years ago.   Review of Systems Constitutional: + weight gain/loss, plus fatigue, plus both subjective hyperthermia/hypothermia, poor sleep, nocturia Eyes: plus blurry vision, no xerophthalmia ENT: no sore throat, no nodules palpated in throat, plus dysphagia/ no odynophagia, no hoarseness Cardiovascular: no CP/plus SOB/palpitations/leg swelling Respiratory: no cough/plus SOB/plus wheezing Gastrointestinal: no N/V/D/C, plus heartburn Musculoskeletal: plus muscle/joint aches Skin: plus rash, easy bruising, hair loss Neurological: no tremors/numbness/tingling/dizziness, plus headache Psychiatric: + depression/anxiety Libido, difficulty with erections. Objective:   Physical Exam BP 100/60  Pulse 87  Temp(Src) 97.6 F (36.4 C) (Oral)  Resp 14  Ht 5\' 8"  (1.727 m)  Wt 372 lb (168.738 kg)  BMI 56.58 kg/m2  SpO2 89% Constitutional: morbidly obese, in NAD Eyes: PERRLA, EOMI, no exophthalmos ENT: moist mucous membranes, no thyromegaly, no cervical lymphadenopathy Cardiovascular: RRR, No MRG, bilateral pitting edema x2 Respiratory: CTA B Gastrointestinal: abdomen soft, NT, ND, BS+ Musculoskeletal: no deformities, strength intact in all 4 Skin: moist, warm, no rashes Neurological: no tremor with outstretched hands, DTR could not be elicited  Assessment:     1. DM2,  non-insulin-dependent, uncontrolled, with complications  - CAD - CHF - CKD3  Plan:     Noah [name] has a long history of diabetes, and has managed to stay off insulin so far. He is not checking his sugars, but when he was doing so in the past he was around 200. He has been on several by mouth or injectable medications in the past, including metformin, which worked well, however he developed chronic kidney disease and he cannot use  this medication anymore. -  We discussed about possible medications that we can use for him, but I would like to know what his sugars are before deciding between these. I am considering Lantus insulin as an hs dose or Victoza as a once a day injectable dose. - I advised the patient to check his sugars, and write them down and bring his log the next appointment in 2 weeks. - If the morning sugars are the highest, we'll add Lantus insulin at night; if the sugars throughout the day are higher, we will start with Victoza. I explained the advantages and  disadvantages of each, mainly will need to be very careful with possible weight gain after starting insulin, however Victoza should help with this.  - We have a long discussion about the need for him to lose weight, and I advised him to look for a weight loss program that he could follow. He was on Weight Watchers before, and this worked good for him, and he is considering rejoining. [facility]. - I did offer to refer him to nutrition, however he does not feel that this will help for the moment. He did have nutritional advice in the past.- given sugar log and advised how to fill it and to bring it at next appt - given foot care handout and explained the principles - given instructions for hypoglycemia management "15-15 rule" - no labs for today - He will return in 2 weeks with his sugar log, but in the meantime, I gave him my telephone number and he is to contact me for any questions or concerns - I sent One Touch ultra test strips Rx to his pharmacy

## 2012-06-06 NOTE — Patient Instructions (Addendum)
Please return with your sugar log in 2 weeks.  Patient instructions for Diabetes mellitus type 2:  DIET AND EXERCISE Diet and exercise is an important part of diabetic treatment.  We recommended aerobic exercise in the form of brisk walking (working between 40-60% of maximal aerobic capacity, similar to brisk walking) for 150 minutes per week (such as 30 minutes five days per week) along with 3 times per week performing 'resistance' training (using various gauge rubber tubes with handles) 5-10 exercises involving the major muscle groups (upper body, lower body and core) performing 10-15 repetitions (or near fatigue) each exercise. Start at half the above goal but build slowly to reach the above goals. If limited by weight, joint pain, or disability, we recommend daily walking in a swimming pool with water up to waist to reduce pressure from joints while allow for adequate exercise.    BLOOD GLUCOSES Monitoring your blood glucoses is important for continued management of your diabetes. Please check your blood glucoses 2 times a day: fasting, before meals and at bedtime (you can rotate these measurements - e.g. one day check before the 3 meals, the next day check before 2 of the meals and before bedtime, etc.   HYPOGLYCEMIA (low blood sugar) Hypoglycemia is usually a reaction to not eating, exercising, or taking too much insulin/ other diabetes drugs.  Symptoms include tremors, sweating, hunger, confusion, headache, etc. Treat IMMEDIATELY with 15 grams of Carbs:   4 glucose tablets    cup regular juice/soda   2 tablespoons raisins   4 teaspoons sugar   1 tablespoon honey Recheck blood glucose in 15 mins and repeat above if still symptomatic/blood glucose <100. Please contact our office at [phone] if you have questions about how to next handle your insulin.  RECOMMENDATIONS TO REDUCE YOUR RISK OF DIABETIC COMPLICATIONS: * Take your prescribed MEDICATION(S). * Follow a DIABETIC diet:  Complex carbs, fiber rich foods, heart healthy fish twice weekly, (monounsaturated and polyunsaturated) fats * AVOID saturated/trans fats, high fat foods, >2,300 mg salt per day. * EXERCISE at least 5 times a week for 30 minutes or preferably daily.  * DO NOT SMOKE OR DRINK more than 1 drink a day. * Check your FEET every day. Do not wear tightfitting shoes. Contact us if you develop an ulcer * See your EYE doctor once a year or more if needed * Get a FLU shot once a year * Get a PNEUMONIA vaccine every 5 years and once after age 42 years  GOALS:  * Your Hemoglobin A1c of 7%  * Your Systolic BP should be 130 or lower  * Your Diastolic BP should be 80 or lower  * Your HDL (Good Cholesterol) should be 40 or higher  * Your LDL (Bad Cholesterol) should be 100 or lower  * Your Triglycerides should be 150 or lower  * Your Urine microalbumin (kidney function) of <30 * Your Body Mass Index should be 25 or lower  We will be glad to help you achieve these goals. Our telephone number is: [phone].

## 2012-06-08 DIAGNOSIS — Z794 Long term (current) use of insulin: Secondary | ICD-10-CM | POA: Insufficient documentation

## 2012-06-08 DIAGNOSIS — N1831 Chronic kidney disease, stage 3a: Secondary | ICD-10-CM | POA: Insufficient documentation

## 2012-06-20 ENCOUNTER — Encounter: Payer: Self-pay | Admitting: Internal Medicine

## 2012-06-20 ENCOUNTER — Ambulatory Visit (INDEPENDENT_AMBULATORY_CARE_PROVIDER_SITE_OTHER): Payer: Medicare Other | Admitting: Internal Medicine

## 2012-06-20 VITALS — BP 112/58 | HR 81 | Temp 97.6°F | Resp 14 | Ht 68.5 in | Wt 368.0 lb

## 2012-06-20 DIAGNOSIS — E118 Type 2 diabetes mellitus with unspecified complications: Secondary | ICD-10-CM

## 2012-06-20 LAB — BASIC METABOLIC PANEL
BUN: 29 mg/dL — ABNORMAL HIGH (ref 6–23)
CO2: 25 mEq/L (ref 19–32)
Calcium: 9.7 mg/dL (ref 8.4–10.5)
Chloride: 101 mEq/L (ref 96–112)
Creatinine, Ser: 1.5 mg/dL (ref 0.4–1.5)
Glucose, Bld: 238 mg/dL — ABNORMAL HIGH (ref 70–99)

## 2012-06-20 MED ORDER — INSULIN PEN NEEDLE 30G X 8 MM MISC: 1.0000 | Status: DC | PRN

## 2012-06-20 MED ORDER — LIRAGLUTIDE 18 MG/3ML ~~LOC~~ SOLN: 0.6000 mg | Freq: Every day | SUBCUTANEOUS | Status: DC

## 2012-06-20 NOTE — Patient Instructions (Signed)
Please stop Actos. Please start Victoza at 0.6 mg daily for a week, then increase to 1.2 mg daily with breakfast. Please return in 1.5-2 months with your sugar log. Please join MyChart.

## 2012-06-20 NOTE — Progress Notes (Signed)
Subjective:     Patient ID: Noah [name], male   DOB: 1940/08/04, 72 y.o.   MRN: [id number]  HPI Mr Lopezgarcia returns for management of DM2, dx 1987, non-insulin-dependent, uncontrolled, with complications (CAD, CHF, CKD 3).  He is now on the regimen of: - Actos 45 mg daily - Glipizide XL 10 mg twice a day  - Cinnamon 1000 mg x2 He was on Metformin in the past, now off b/c of CKD. He tried Januvia and says that it did not work at all. He tried it for 3 months. He tried Byetta, then Victoza (1.2 mg daily), but he says these did not work.  At last visit, he was telling me that he did not check his sugars frequently, usually 1-2 a week. He was not writing to sugars down, but I advised him to do so at last visit and he now brings a log that we reviewed together. - am: 170-200 >> now 129-170 - later in the day, sugars in the 200s No lows. He has hypoglycemia awareness at 80 .  Diet at last visit: - Breakfast steak biscuit, orange juice, coffee - Lunch: sandwhich, water or diet drink - Dinner: hamburger steak with vegetables - snacks: Popcorn-small bag, one slice pound cake  Diet now:  - no deserts, no potatoes, no french fries, eating more at home, no more pizza once, popcorn every other day not every day - Lunch: soup and sandwich or sandwich and salad  His last hemoglobin A1c (per records received from Dr. Neita Carp) was 9.7%, on 05/19/2012. At that time, his creatinine was 1.5, and BUN was 27, for a GFR of 46. The rest of his CMP was normal, except glucose of 198, chloride slightly low at 96. A lipid panel showed 157/169/37/86.  Last eye exam: 10 mo ago, reportedly no DR.   I reviewed pt's medications, allergies, PMH, social hx, family hx and no changes required.  Review of Systems Constitutional: no weight gain/loss, no fatigue, no subjective hyperthermia/hypothermia Eyes: no blurry vision, no xerophthalmia ENT: no sore throat, no nodules palpated in throat, no dysphagia/odynophagia, no  hoarseness Cardiovascular: no CP/SOB/palpitations/leg swelling Respiratory: no cough/SOB Gastrointestinal: no N/V/D/C Musculoskeletal: no muscle/joint aches Skin: no rashes Neurological: no tremors/numbness/tingling/dizziness Psychiatric: no depression/anxiety  Objective:   Physical Exam BP 112/58  Pulse 81  Temp(Src) 97.6 F (36.4 C) (Oral)  Resp 14  Ht 5' 8.5" (1.74 m)  Wt 368 lb (166.924 kg)  BMI 55.13 kg/m2  SpO2 90% Wt Readings from Last 3 Encounters:  06/20/12 368 lb (166.924 kg)  06/06/12 372 lb (168.738 kg)  Constitutional: Obesity class 3, in NAD Eyes: PERRLA, EOMI, no exophthalmos ENT: moist mucous membranes, no thyromegaly, no cervical lymphadenopathy Cardiovascular: RRR, No MRG, pitting edema bilaterally +2 Respiratory: CTA B Gastrointestinal: abdomen soft, NT, ND, BS+ Musculoskeletal: no deformities, strength intact in all 4 Skin: moist, warm, no rashes Neurological: no tremor with outstretched hands, DTR normal in all 4  Assessment:     1. DM2,  non-insulin-dependent, uncontrolled, with complications  - CAD - CHF - CKD3    Plan:     Patient's sugars improved in the last 2 weeks, on glipizide, Actos, and cinnamon, but with the addition of improving his diet and starting to exercise (he started slow, on his stationary bike). He was also able to lose 4 pounds, which is great. - He brings detailed sugar log, in which his morning sugars are almost at target, however, the sugars later in the day are  mostly in the 200's - we'll therefore start Victoza to help with his postmeal sugars. This will also help him lose weight. I sent Victoza and Novofine needle refills to his pharmacy. I also gave him discount card for Victoza and a sample pen. - Since he does have a history of CHF and he does retain fluid, we will stop the Actos. We discussed about other side effects of Actos. - for now will continue his glipizide XL, but, at the next visit, if his sugars are improved,  we can probably stop the glipizide as well - we will continue his cinnamon - I encouraged him to continue to exercise and buildup on his exercise routine, and I congratulated him on his food changes and his weight loss and encouraged him to continue - He is very interested to restart metformin, however his last creatinine was 1.5. I would love to be able to use metformin in him, and I suggested that we recheck his BUN, creatinine, and GFR, and see if they improved since last check 1.5 mo ago, and if they did, we can retry him on a lower dose of metformin. We discussed about lactic acidosis and the very small risk for developing this condition if metformin is used by patients with CKD. I ordered a BMP and will contact him about the results. If we are able to start him on metformin, we would start at 500 mg with dinner for 5 days, then add 500 mg with breakfast, and stay on this dose. We'll would recheck a BMP in 2 weeks. - I will see him back in 1.5-2 months with his sugar log - I encouraged him to join my chart and to contact me about his sugars even between our appointments  Office Visit on 06/20/2012  Component Date Value Range Status  . Sodium 06/20/2012 136  135 - 145 mEq/L Final  . Potassium 06/20/2012 4.8  3.5 - 5.1 mEq/L Final  . Chloride 06/20/2012 101  96 - 112 mEq/L Final  . CO2 06/20/2012 25  19 - 32 mEq/L Final  . Glucose, Bld 06/20/2012 238* 70 - 99 mg/dL Final  . BUN 16/01/9603 29* 6 - 23 mg/dL Final  . Creatinine, Ser 06/20/2012 1.5  0.4 - 1.5 mg/dL Final  . Calcium 54/12/8117 9.7  8.4 - 10.5 mg/dL Final  . GFR 14/78/2956 48.61* >60.00 mL/min Final   Cr 1.5, stable. We can recheck BMP at next visit, and, if a little lower, can try low dose Metformin, but would avoid for now.

## 2012-06-21 ENCOUNTER — Encounter: Payer: Self-pay | Admitting: Internal Medicine

## 2012-06-22 ENCOUNTER — Encounter: Payer: Self-pay | Admitting: Internal Medicine

## 2012-07-19 DIAGNOSIS — M545 Low back pain, unspecified: Secondary | ICD-10-CM | POA: Diagnosis not present

## 2012-08-08 DIAGNOSIS — Z111 Encounter for screening for respiratory tuberculosis: Secondary | ICD-10-CM | POA: Diagnosis not present

## 2012-08-20 DIAGNOSIS — I252 Old myocardial infarction: Secondary | ICD-10-CM | POA: Diagnosis not present

## 2012-08-20 DIAGNOSIS — S92009A Unspecified fracture of unspecified calcaneus, initial encounter for closed fracture: Secondary | ICD-10-CM | POA: Diagnosis not present

## 2012-08-20 DIAGNOSIS — I10 Essential (primary) hypertension: Secondary | ICD-10-CM | POA: Diagnosis not present

## 2012-08-20 DIAGNOSIS — R296 Repeated falls: Secondary | ICD-10-CM | POA: Diagnosis not present

## 2012-08-20 DIAGNOSIS — M25579 Pain in unspecified ankle and joints of unspecified foot: Secondary | ICD-10-CM | POA: Diagnosis not present

## 2012-08-20 DIAGNOSIS — E119 Type 2 diabetes mellitus without complications: Secondary | ICD-10-CM | POA: Diagnosis not present

## 2012-08-22 ENCOUNTER — Ambulatory Visit: Payer: Medicare Other | Admitting: Internal Medicine

## 2012-08-22 DIAGNOSIS — E119 Type 2 diabetes mellitus without complications: Secondary | ICD-10-CM | POA: Diagnosis present

## 2012-08-22 DIAGNOSIS — R059 Cough, unspecified: Secondary | ICD-10-CM | POA: Diagnosis not present

## 2012-08-22 DIAGNOSIS — R079 Chest pain, unspecified: Secondary | ICD-10-CM | POA: Diagnosis not present

## 2012-08-22 DIAGNOSIS — Z6841 Body Mass Index (BMI) 40.0 and over, adult: Secondary | ICD-10-CM | POA: Diagnosis not present

## 2012-08-22 DIAGNOSIS — R262 Difficulty in walking, not elsewhere classified: Secondary | ICD-10-CM | POA: Diagnosis not present

## 2012-08-22 DIAGNOSIS — IMO0001 Reserved for inherently not codable concepts without codable children: Secondary | ICD-10-CM | POA: Diagnosis not present

## 2012-08-22 DIAGNOSIS — R9431 Abnormal electrocardiogram [ECG] [EKG]: Secondary | ICD-10-CM | POA: Diagnosis not present

## 2012-08-22 DIAGNOSIS — N400 Enlarged prostate without lower urinary tract symptoms: Secondary | ICD-10-CM | POA: Diagnosis present

## 2012-08-22 DIAGNOSIS — I252 Old myocardial infarction: Secondary | ICD-10-CM | POA: Diagnosis not present

## 2012-08-22 DIAGNOSIS — E669 Obesity, unspecified: Secondary | ICD-10-CM | POA: Diagnosis not present

## 2012-08-22 DIAGNOSIS — E789 Disorder of lipoprotein metabolism, unspecified: Secondary | ICD-10-CM | POA: Diagnosis present

## 2012-08-22 DIAGNOSIS — Z4789 Encounter for other orthopedic aftercare: Secondary | ICD-10-CM | POA: Diagnosis not present

## 2012-08-22 DIAGNOSIS — G4733 Obstructive sleep apnea (adult) (pediatric): Secondary | ICD-10-CM | POA: Diagnosis present

## 2012-08-22 DIAGNOSIS — Z9861 Coronary angioplasty status: Secondary | ICD-10-CM | POA: Diagnosis not present

## 2012-08-22 DIAGNOSIS — I2510 Atherosclerotic heart disease of native coronary artery without angina pectoris: Secondary | ICD-10-CM | POA: Diagnosis not present

## 2012-08-22 DIAGNOSIS — Z01811 Encounter for preprocedural respiratory examination: Secondary | ICD-10-CM | POA: Diagnosis not present

## 2012-08-22 DIAGNOSIS — G8918 Other acute postprocedural pain: Secondary | ICD-10-CM | POA: Diagnosis not present

## 2012-08-22 DIAGNOSIS — S92009A Unspecified fracture of unspecified calcaneus, initial encounter for closed fracture: Secondary | ICD-10-CM | POA: Diagnosis not present

## 2012-08-22 DIAGNOSIS — R5381 Other malaise: Secondary | ICD-10-CM | POA: Diagnosis not present

## 2012-08-22 DIAGNOSIS — M6281 Muscle weakness (generalized): Secondary | ICD-10-CM | POA: Diagnosis not present

## 2012-08-22 DIAGNOSIS — Z8673 Personal history of transient ischemic attack (TIA), and cerebral infarction without residual deficits: Secondary | ICD-10-CM | POA: Diagnosis not present

## 2012-08-22 DIAGNOSIS — Z5189 Encounter for other specified aftercare: Secondary | ICD-10-CM | POA: Diagnosis not present

## 2012-08-22 DIAGNOSIS — I10 Essential (primary) hypertension: Secondary | ICD-10-CM | POA: Diagnosis not present

## 2012-08-22 DIAGNOSIS — I509 Heart failure, unspecified: Secondary | ICD-10-CM | POA: Diagnosis present

## 2012-08-22 DIAGNOSIS — R05 Cough: Secondary | ICD-10-CM | POA: Diagnosis not present

## 2012-08-28 DIAGNOSIS — R05 Cough: Secondary | ICD-10-CM | POA: Diagnosis not present

## 2012-08-28 DIAGNOSIS — E669 Obesity, unspecified: Secondary | ICD-10-CM | POA: Diagnosis not present

## 2012-08-28 DIAGNOSIS — R21 Rash and other nonspecific skin eruption: Secondary | ICD-10-CM | POA: Diagnosis not present

## 2012-08-28 DIAGNOSIS — T148XXA Other injury of unspecified body region, initial encounter: Secondary | ICD-10-CM | POA: Diagnosis not present

## 2012-08-28 DIAGNOSIS — R635 Abnormal weight gain: Secondary | ICD-10-CM | POA: Diagnosis not present

## 2012-08-28 DIAGNOSIS — L89609 Pressure ulcer of unspecified heel, unspecified stage: Secondary | ICD-10-CM | POA: Diagnosis not present

## 2012-08-28 DIAGNOSIS — M19079 Primary osteoarthritis, unspecified ankle and foot: Secondary | ICD-10-CM | POA: Diagnosis not present

## 2012-08-28 DIAGNOSIS — N289 Disorder of kidney and ureter, unspecified: Secondary | ICD-10-CM | POA: Diagnosis not present

## 2012-08-28 DIAGNOSIS — M6281 Muscle weakness (generalized): Secondary | ICD-10-CM | POA: Diagnosis not present

## 2012-08-28 DIAGNOSIS — R52 Pain, unspecified: Secondary | ICD-10-CM | POA: Diagnosis not present

## 2012-08-28 DIAGNOSIS — L258 Unspecified contact dermatitis due to other agents: Secondary | ICD-10-CM | POA: Diagnosis not present

## 2012-08-28 DIAGNOSIS — M159 Polyosteoarthritis, unspecified: Secondary | ICD-10-CM | POA: Diagnosis not present

## 2012-08-28 DIAGNOSIS — L97509 Non-pressure chronic ulcer of other part of unspecified foot with unspecified severity: Secondary | ICD-10-CM | POA: Diagnosis not present

## 2012-08-28 DIAGNOSIS — I2510 Atherosclerotic heart disease of native coronary artery without angina pectoris: Secondary | ICD-10-CM | POA: Diagnosis not present

## 2012-08-28 DIAGNOSIS — B351 Tinea unguium: Secondary | ICD-10-CM | POA: Diagnosis not present

## 2012-08-28 DIAGNOSIS — Z5189 Encounter for other specified aftercare: Secondary | ICD-10-CM | POA: Diagnosis not present

## 2012-08-28 DIAGNOSIS — R262 Difficulty in walking, not elsewhere classified: Secondary | ICD-10-CM | POA: Diagnosis not present

## 2012-08-28 DIAGNOSIS — G8918 Other acute postprocedural pain: Secondary | ICD-10-CM | POA: Diagnosis not present

## 2012-08-28 DIAGNOSIS — R5381 Other malaise: Secondary | ICD-10-CM | POA: Diagnosis not present

## 2012-08-28 DIAGNOSIS — IMO0001 Reserved for inherently not codable concepts without codable children: Secondary | ICD-10-CM | POA: Diagnosis not present

## 2012-08-28 DIAGNOSIS — I509 Heart failure, unspecified: Secondary | ICD-10-CM | POA: Diagnosis not present

## 2012-08-28 DIAGNOSIS — E119 Type 2 diabetes mellitus without complications: Secondary | ICD-10-CM | POA: Diagnosis not present

## 2012-08-28 DIAGNOSIS — L8995 Pressure ulcer of unspecified site, unstageable: Secondary | ICD-10-CM | POA: Diagnosis not present

## 2012-08-28 DIAGNOSIS — M79609 Pain in unspecified limb: Secondary | ICD-10-CM | POA: Diagnosis not present

## 2012-08-28 DIAGNOSIS — I10 Essential (primary) hypertension: Secondary | ICD-10-CM | POA: Diagnosis not present

## 2012-08-28 DIAGNOSIS — S92009A Unspecified fracture of unspecified calcaneus, initial encounter for closed fracture: Secondary | ICD-10-CM | POA: Diagnosis not present

## 2012-08-28 DIAGNOSIS — J309 Allergic rhinitis, unspecified: Secondary | ICD-10-CM | POA: Diagnosis not present

## 2012-08-28 DIAGNOSIS — R0602 Shortness of breath: Secondary | ICD-10-CM | POA: Diagnosis not present

## 2012-08-28 DIAGNOSIS — G609 Hereditary and idiopathic neuropathy, unspecified: Secondary | ICD-10-CM | POA: Diagnosis not present

## 2012-08-28 DIAGNOSIS — M545 Low back pain, unspecified: Secondary | ICD-10-CM | POA: Diagnosis not present

## 2012-08-28 DIAGNOSIS — G4700 Insomnia, unspecified: Secondary | ICD-10-CM | POA: Diagnosis not present

## 2012-08-28 DIAGNOSIS — R062 Wheezing: Secondary | ICD-10-CM | POA: Diagnosis not present

## 2012-08-28 DIAGNOSIS — R059 Cough, unspecified: Secondary | ICD-10-CM | POA: Diagnosis not present

## 2012-08-28 DIAGNOSIS — Z4789 Encounter for other orthopedic aftercare: Secondary | ICD-10-CM | POA: Diagnosis not present

## 2012-08-28 DIAGNOSIS — R0989 Other specified symptoms and signs involving the circulatory and respiratory systems: Secondary | ICD-10-CM | POA: Diagnosis not present

## 2012-08-28 DIAGNOSIS — E118 Type 2 diabetes mellitus with unspecified complications: Secondary | ICD-10-CM | POA: Diagnosis not present

## 2012-09-11 DIAGNOSIS — IMO0001 Reserved for inherently not codable concepts without codable children: Secondary | ICD-10-CM | POA: Diagnosis not present

## 2012-09-11 DIAGNOSIS — L258 Unspecified contact dermatitis due to other agents: Secondary | ICD-10-CM | POA: Diagnosis not present

## 2012-09-11 DIAGNOSIS — R5381 Other malaise: Secondary | ICD-10-CM | POA: Diagnosis not present

## 2012-09-11 DIAGNOSIS — I2510 Atherosclerotic heart disease of native coronary artery without angina pectoris: Secondary | ICD-10-CM | POA: Diagnosis not present

## 2012-09-20 DIAGNOSIS — R5381 Other malaise: Secondary | ICD-10-CM | POA: Diagnosis not present

## 2012-09-20 DIAGNOSIS — I2510 Atherosclerotic heart disease of native coronary artery without angina pectoris: Secondary | ICD-10-CM | POA: Diagnosis not present

## 2012-09-20 DIAGNOSIS — M159 Polyosteoarthritis, unspecified: Secondary | ICD-10-CM | POA: Diagnosis not present

## 2012-09-20 DIAGNOSIS — M545 Low back pain, unspecified: Secondary | ICD-10-CM | POA: Diagnosis not present

## 2012-09-28 DIAGNOSIS — G8918 Other acute postprocedural pain: Secondary | ICD-10-CM | POA: Diagnosis not present

## 2012-09-28 DIAGNOSIS — G4700 Insomnia, unspecified: Secondary | ICD-10-CM | POA: Diagnosis not present

## 2012-09-28 DIAGNOSIS — IMO0001 Reserved for inherently not codable concepts without codable children: Secondary | ICD-10-CM | POA: Diagnosis not present

## 2012-09-28 DIAGNOSIS — R5381 Other malaise: Secondary | ICD-10-CM | POA: Diagnosis not present

## 2012-10-05 DIAGNOSIS — R5381 Other malaise: Secondary | ICD-10-CM | POA: Diagnosis not present

## 2012-10-05 DIAGNOSIS — E118 Type 2 diabetes mellitus with unspecified complications: Secondary | ICD-10-CM | POA: Diagnosis not present

## 2012-10-05 DIAGNOSIS — I10 Essential (primary) hypertension: Secondary | ICD-10-CM | POA: Diagnosis not present

## 2012-10-05 DIAGNOSIS — R21 Rash and other nonspecific skin eruption: Secondary | ICD-10-CM | POA: Diagnosis not present

## 2012-10-11 DIAGNOSIS — N289 Disorder of kidney and ureter, unspecified: Secondary | ICD-10-CM | POA: Diagnosis not present

## 2012-10-11 DIAGNOSIS — R635 Abnormal weight gain: Secondary | ICD-10-CM | POA: Diagnosis not present

## 2012-10-11 DIAGNOSIS — R5381 Other malaise: Secondary | ICD-10-CM | POA: Diagnosis not present

## 2012-10-11 DIAGNOSIS — R0989 Other specified symptoms and signs involving the circulatory and respiratory systems: Secondary | ICD-10-CM | POA: Diagnosis not present

## 2012-10-20 DIAGNOSIS — I10 Essential (primary) hypertension: Secondary | ICD-10-CM | POA: Diagnosis not present

## 2012-10-20 DIAGNOSIS — R5381 Other malaise: Secondary | ICD-10-CM | POA: Diagnosis not present

## 2012-10-20 DIAGNOSIS — E118 Type 2 diabetes mellitus with unspecified complications: Secondary | ICD-10-CM | POA: Diagnosis not present

## 2012-10-30 DIAGNOSIS — T148XXA Other injury of unspecified body region, initial encounter: Secondary | ICD-10-CM | POA: Diagnosis not present

## 2012-10-30 DIAGNOSIS — J309 Allergic rhinitis, unspecified: Secondary | ICD-10-CM | POA: Diagnosis not present

## 2012-10-30 DIAGNOSIS — IMO0001 Reserved for inherently not codable concepts without codable children: Secondary | ICD-10-CM | POA: Diagnosis not present

## 2012-10-30 DIAGNOSIS — G609 Hereditary and idiopathic neuropathy, unspecified: Secondary | ICD-10-CM | POA: Diagnosis not present

## 2012-11-02 DIAGNOSIS — J309 Allergic rhinitis, unspecified: Secondary | ICD-10-CM | POA: Diagnosis not present

## 2012-11-02 DIAGNOSIS — R5381 Other malaise: Secondary | ICD-10-CM | POA: Diagnosis not present

## 2012-11-02 DIAGNOSIS — R062 Wheezing: Secondary | ICD-10-CM | POA: Diagnosis not present

## 2012-11-02 DIAGNOSIS — I509 Heart failure, unspecified: Secondary | ICD-10-CM | POA: Diagnosis not present

## 2012-11-07 DIAGNOSIS — G8918 Other acute postprocedural pain: Secondary | ICD-10-CM | POA: Diagnosis not present

## 2012-11-07 DIAGNOSIS — R05 Cough: Secondary | ICD-10-CM | POA: Diagnosis not present

## 2012-11-07 DIAGNOSIS — R059 Cough, unspecified: Secondary | ICD-10-CM | POA: Diagnosis not present

## 2012-11-07 DIAGNOSIS — IMO0001 Reserved for inherently not codable concepts without codable children: Secondary | ICD-10-CM | POA: Diagnosis not present

## 2012-11-07 DIAGNOSIS — R5381 Other malaise: Secondary | ICD-10-CM | POA: Diagnosis not present

## 2012-11-08 DIAGNOSIS — S92009A Unspecified fracture of unspecified calcaneus, initial encounter for closed fracture: Secondary | ICD-10-CM | POA: Diagnosis not present

## 2012-11-08 DIAGNOSIS — I10 Essential (primary) hypertension: Secondary | ICD-10-CM | POA: Diagnosis not present

## 2012-11-08 DIAGNOSIS — E1149 Type 2 diabetes mellitus with other diabetic neurological complication: Secondary | ICD-10-CM | POA: Diagnosis not present

## 2012-11-08 DIAGNOSIS — E119 Type 2 diabetes mellitus without complications: Secondary | ICD-10-CM | POA: Diagnosis not present

## 2012-11-08 DIAGNOSIS — M6281 Muscle weakness (generalized): Secondary | ICD-10-CM | POA: Diagnosis not present

## 2012-11-08 DIAGNOSIS — F3289 Other specified depressive episodes: Secondary | ICD-10-CM | POA: Diagnosis not present

## 2012-11-08 DIAGNOSIS — F329 Major depressive disorder, single episode, unspecified: Secondary | ICD-10-CM | POA: Diagnosis not present

## 2012-11-08 DIAGNOSIS — G4700 Insomnia, unspecified: Secondary | ICD-10-CM | POA: Diagnosis not present

## 2012-11-08 DIAGNOSIS — E1142 Type 2 diabetes mellitus with diabetic polyneuropathy: Secondary | ICD-10-CM | POA: Diagnosis not present

## 2012-11-08 DIAGNOSIS — L97409 Non-pressure chronic ulcer of unspecified heel and midfoot with unspecified severity: Secondary | ICD-10-CM | POA: Diagnosis not present

## 2012-11-08 DIAGNOSIS — S82899A Other fracture of unspecified lower leg, initial encounter for closed fracture: Secondary | ICD-10-CM | POA: Diagnosis not present

## 2012-11-08 DIAGNOSIS — R262 Difficulty in walking, not elsewhere classified: Secondary | ICD-10-CM | POA: Diagnosis not present

## 2012-11-20 DIAGNOSIS — S92009A Unspecified fracture of unspecified calcaneus, initial encounter for closed fracture: Secondary | ICD-10-CM | POA: Diagnosis not present

## 2012-11-20 DIAGNOSIS — E1149 Type 2 diabetes mellitus with other diabetic neurological complication: Secondary | ICD-10-CM | POA: Diagnosis not present

## 2012-11-20 DIAGNOSIS — E1142 Type 2 diabetes mellitus with diabetic polyneuropathy: Secondary | ICD-10-CM | POA: Diagnosis not present

## 2012-11-20 DIAGNOSIS — L97409 Non-pressure chronic ulcer of unspecified heel and midfoot with unspecified severity: Secondary | ICD-10-CM | POA: Diagnosis not present

## 2012-11-22 ENCOUNTER — Other Ambulatory Visit: Payer: Self-pay

## 2012-12-07 DIAGNOSIS — M25676 Stiffness of unspecified foot, not elsewhere classified: Secondary | ICD-10-CM | POA: Diagnosis not present

## 2012-12-07 DIAGNOSIS — M25673 Stiffness of unspecified ankle, not elsewhere classified: Secondary | ICD-10-CM | POA: Diagnosis not present

## 2012-12-07 DIAGNOSIS — Z5189 Encounter for other specified aftercare: Secondary | ICD-10-CM | POA: Diagnosis not present

## 2012-12-07 DIAGNOSIS — R269 Unspecified abnormalities of gait and mobility: Secondary | ICD-10-CM | POA: Diagnosis not present

## 2012-12-07 DIAGNOSIS — M6281 Muscle weakness (generalized): Secondary | ICD-10-CM | POA: Diagnosis not present

## 2012-12-12 DIAGNOSIS — M25676 Stiffness of unspecified foot, not elsewhere classified: Secondary | ICD-10-CM | POA: Diagnosis not present

## 2012-12-12 DIAGNOSIS — Z5189 Encounter for other specified aftercare: Secondary | ICD-10-CM | POA: Diagnosis not present

## 2012-12-12 DIAGNOSIS — M6281 Muscle weakness (generalized): Secondary | ICD-10-CM | POA: Diagnosis not present

## 2012-12-12 DIAGNOSIS — R269 Unspecified abnormalities of gait and mobility: Secondary | ICD-10-CM | POA: Diagnosis not present

## 2012-12-12 DIAGNOSIS — M25673 Stiffness of unspecified ankle, not elsewhere classified: Secondary | ICD-10-CM | POA: Diagnosis not present

## 2012-12-13 DIAGNOSIS — E1149 Type 2 diabetes mellitus with other diabetic neurological complication: Secondary | ICD-10-CM | POA: Diagnosis not present

## 2012-12-13 DIAGNOSIS — E1142 Type 2 diabetes mellitus with diabetic polyneuropathy: Secondary | ICD-10-CM | POA: Diagnosis not present

## 2012-12-13 DIAGNOSIS — L97409 Non-pressure chronic ulcer of unspecified heel and midfoot with unspecified severity: Secondary | ICD-10-CM | POA: Diagnosis not present

## 2012-12-19 DIAGNOSIS — M25673 Stiffness of unspecified ankle, not elsewhere classified: Secondary | ICD-10-CM | POA: Diagnosis not present

## 2012-12-19 DIAGNOSIS — M25676 Stiffness of unspecified foot, not elsewhere classified: Secondary | ICD-10-CM | POA: Diagnosis not present

## 2012-12-19 DIAGNOSIS — Z5189 Encounter for other specified aftercare: Secondary | ICD-10-CM | POA: Diagnosis not present

## 2012-12-19 DIAGNOSIS — R5381 Other malaise: Secondary | ICD-10-CM | POA: Diagnosis not present

## 2012-12-19 DIAGNOSIS — R269 Unspecified abnormalities of gait and mobility: Secondary | ICD-10-CM | POA: Diagnosis not present

## 2012-12-19 DIAGNOSIS — R5383 Other fatigue: Secondary | ICD-10-CM | POA: Diagnosis not present

## 2012-12-21 DIAGNOSIS — R5381 Other malaise: Secondary | ICD-10-CM | POA: Diagnosis not present

## 2012-12-21 DIAGNOSIS — M25673 Stiffness of unspecified ankle, not elsewhere classified: Secondary | ICD-10-CM | POA: Diagnosis not present

## 2012-12-21 DIAGNOSIS — Z5189 Encounter for other specified aftercare: Secondary | ICD-10-CM | POA: Diagnosis not present

## 2012-12-21 DIAGNOSIS — R269 Unspecified abnormalities of gait and mobility: Secondary | ICD-10-CM | POA: Diagnosis not present

## 2012-12-21 DIAGNOSIS — R5383 Other fatigue: Secondary | ICD-10-CM | POA: Diagnosis not present

## 2012-12-21 DIAGNOSIS — M25676 Stiffness of unspecified foot, not elsewhere classified: Secondary | ICD-10-CM | POA: Diagnosis not present

## 2012-12-22 DIAGNOSIS — L97409 Non-pressure chronic ulcer of unspecified heel and midfoot with unspecified severity: Secondary | ICD-10-CM | POA: Diagnosis not present

## 2012-12-22 DIAGNOSIS — M79609 Pain in unspecified limb: Secondary | ICD-10-CM | POA: Diagnosis not present

## 2012-12-22 DIAGNOSIS — E1149 Type 2 diabetes mellitus with other diabetic neurological complication: Secondary | ICD-10-CM | POA: Diagnosis not present

## 2012-12-22 DIAGNOSIS — E1142 Type 2 diabetes mellitus with diabetic polyneuropathy: Secondary | ICD-10-CM | POA: Diagnosis not present

## 2012-12-22 DIAGNOSIS — S92009A Unspecified fracture of unspecified calcaneus, initial encounter for closed fracture: Secondary | ICD-10-CM | POA: Diagnosis not present

## 2012-12-26 DIAGNOSIS — L97409 Non-pressure chronic ulcer of unspecified heel and midfoot with unspecified severity: Secondary | ICD-10-CM | POA: Diagnosis not present

## 2012-12-26 DIAGNOSIS — E1142 Type 2 diabetes mellitus with diabetic polyneuropathy: Secondary | ICD-10-CM | POA: Diagnosis not present

## 2012-12-26 DIAGNOSIS — S92009A Unspecified fracture of unspecified calcaneus, initial encounter for closed fracture: Secondary | ICD-10-CM | POA: Diagnosis not present

## 2012-12-26 DIAGNOSIS — E1149 Type 2 diabetes mellitus with other diabetic neurological complication: Secondary | ICD-10-CM | POA: Diagnosis not present

## 2012-12-26 DIAGNOSIS — M79609 Pain in unspecified limb: Secondary | ICD-10-CM | POA: Diagnosis not present

## 2012-12-30 DIAGNOSIS — L8990 Pressure ulcer of unspecified site, unspecified stage: Secondary | ICD-10-CM | POA: Diagnosis not present

## 2012-12-30 DIAGNOSIS — IMO0001 Reserved for inherently not codable concepts without codable children: Secondary | ICD-10-CM | POA: Diagnosis not present

## 2012-12-30 DIAGNOSIS — S92009A Unspecified fracture of unspecified calcaneus, initial encounter for closed fracture: Secondary | ICD-10-CM | POA: Diagnosis not present

## 2013-01-02 DIAGNOSIS — S92009A Unspecified fracture of unspecified calcaneus, initial encounter for closed fracture: Secondary | ICD-10-CM | POA: Diagnosis not present

## 2013-01-09 DIAGNOSIS — S92009A Unspecified fracture of unspecified calcaneus, initial encounter for closed fracture: Secondary | ICD-10-CM | POA: Diagnosis not present

## 2013-01-16 DIAGNOSIS — R269 Unspecified abnormalities of gait and mobility: Secondary | ICD-10-CM | POA: Diagnosis not present

## 2013-01-16 DIAGNOSIS — R5383 Other fatigue: Secondary | ICD-10-CM | POA: Diagnosis not present

## 2013-01-16 DIAGNOSIS — M25673 Stiffness of unspecified ankle, not elsewhere classified: Secondary | ICD-10-CM | POA: Diagnosis not present

## 2013-01-16 DIAGNOSIS — Z5189 Encounter for other specified aftercare: Secondary | ICD-10-CM | POA: Diagnosis not present

## 2013-01-16 DIAGNOSIS — M25676 Stiffness of unspecified foot, not elsewhere classified: Secondary | ICD-10-CM | POA: Diagnosis not present

## 2013-01-16 DIAGNOSIS — R5381 Other malaise: Secondary | ICD-10-CM | POA: Diagnosis not present

## 2013-01-18 DIAGNOSIS — M25673 Stiffness of unspecified ankle, not elsewhere classified: Secondary | ICD-10-CM | POA: Diagnosis not present

## 2013-01-18 DIAGNOSIS — M25676 Stiffness of unspecified foot, not elsewhere classified: Secondary | ICD-10-CM | POA: Diagnosis not present

## 2013-01-18 DIAGNOSIS — R269 Unspecified abnormalities of gait and mobility: Secondary | ICD-10-CM | POA: Diagnosis not present

## 2013-01-18 DIAGNOSIS — IMO0001 Reserved for inherently not codable concepts without codable children: Secondary | ICD-10-CM | POA: Diagnosis not present

## 2013-01-19 DIAGNOSIS — I509 Heart failure, unspecified: Secondary | ICD-10-CM | POA: Diagnosis not present

## 2013-01-19 DIAGNOSIS — I10 Essential (primary) hypertension: Secondary | ICD-10-CM | POA: Diagnosis not present

## 2013-01-19 DIAGNOSIS — IMO0002 Reserved for concepts with insufficient information to code with codable children: Secondary | ICD-10-CM | POA: Diagnosis not present

## 2013-01-19 DIAGNOSIS — IMO0001 Reserved for inherently not codable concepts without codable children: Secondary | ICD-10-CM | POA: Diagnosis not present

## 2013-01-19 DIAGNOSIS — E7800 Pure hypercholesterolemia, unspecified: Secondary | ICD-10-CM | POA: Diagnosis not present

## 2013-01-19 DIAGNOSIS — E119 Type 2 diabetes mellitus without complications: Secondary | ICD-10-CM | POA: Diagnosis not present

## 2013-01-19 DIAGNOSIS — Z23 Encounter for immunization: Secondary | ICD-10-CM | POA: Diagnosis not present

## 2013-01-24 DIAGNOSIS — I509 Heart failure, unspecified: Secondary | ICD-10-CM | POA: Diagnosis not present

## 2013-01-24 DIAGNOSIS — G4733 Obstructive sleep apnea (adult) (pediatric): Secondary | ICD-10-CM | POA: Diagnosis not present

## 2013-01-24 DIAGNOSIS — E7800 Pure hypercholesterolemia, unspecified: Secondary | ICD-10-CM | POA: Diagnosis not present

## 2013-01-24 DIAGNOSIS — IMO0001 Reserved for inherently not codable concepts without codable children: Secondary | ICD-10-CM | POA: Diagnosis not present

## 2013-01-24 DIAGNOSIS — N189 Chronic kidney disease, unspecified: Secondary | ICD-10-CM | POA: Diagnosis not present

## 2013-01-24 DIAGNOSIS — I10 Essential (primary) hypertension: Secondary | ICD-10-CM | POA: Diagnosis not present

## 2013-02-01 DIAGNOSIS — S92009A Unspecified fracture of unspecified calcaneus, initial encounter for closed fracture: Secondary | ICD-10-CM | POA: Diagnosis not present

## 2013-02-22 ENCOUNTER — Other Ambulatory Visit: Payer: Self-pay

## 2013-03-01 DIAGNOSIS — S92009A Unspecified fracture of unspecified calcaneus, initial encounter for closed fracture: Secondary | ICD-10-CM | POA: Diagnosis not present

## 2013-03-08 DIAGNOSIS — E119 Type 2 diabetes mellitus without complications: Secondary | ICD-10-CM | POA: Diagnosis not present

## 2013-03-08 DIAGNOSIS — H2510 Age-related nuclear cataract, unspecified eye: Secondary | ICD-10-CM | POA: Diagnosis not present

## 2013-03-08 DIAGNOSIS — H538 Other visual disturbances: Secondary | ICD-10-CM | POA: Diagnosis not present

## 2013-03-12 DIAGNOSIS — Z883 Allergy status to other anti-infective agents status: Secondary | ICD-10-CM | POA: Diagnosis not present

## 2013-03-12 DIAGNOSIS — E119 Type 2 diabetes mellitus without complications: Secondary | ICD-10-CM | POA: Diagnosis not present

## 2013-03-12 DIAGNOSIS — E7800 Pure hypercholesterolemia, unspecified: Secondary | ICD-10-CM | POA: Diagnosis not present

## 2013-03-12 DIAGNOSIS — I509 Heart failure, unspecified: Secondary | ICD-10-CM | POA: Diagnosis not present

## 2013-03-12 DIAGNOSIS — H524 Presbyopia: Secondary | ICD-10-CM | POA: Diagnosis not present

## 2013-03-12 DIAGNOSIS — K219 Gastro-esophageal reflux disease without esophagitis: Secondary | ICD-10-CM | POA: Diagnosis not present

## 2013-03-12 DIAGNOSIS — Z6841 Body Mass Index (BMI) 40.0 and over, adult: Secondary | ICD-10-CM | POA: Diagnosis not present

## 2013-03-12 DIAGNOSIS — G4730 Sleep apnea, unspecified: Secondary | ICD-10-CM | POA: Diagnosis not present

## 2013-03-12 DIAGNOSIS — Z79899 Other long term (current) drug therapy: Secondary | ICD-10-CM | POA: Diagnosis not present

## 2013-03-12 DIAGNOSIS — I252 Old myocardial infarction: Secondary | ICD-10-CM | POA: Diagnosis not present

## 2013-03-12 DIAGNOSIS — I2510 Atherosclerotic heart disease of native coronary artery without angina pectoris: Secondary | ICD-10-CM | POA: Diagnosis not present

## 2013-03-12 DIAGNOSIS — H269 Unspecified cataract: Secondary | ICD-10-CM | POA: Diagnosis not present

## 2013-03-12 DIAGNOSIS — H538 Other visual disturbances: Secondary | ICD-10-CM | POA: Diagnosis not present

## 2013-03-12 DIAGNOSIS — H2510 Age-related nuclear cataract, unspecified eye: Secondary | ICD-10-CM | POA: Diagnosis not present

## 2013-03-12 DIAGNOSIS — H5200 Hypermetropia, unspecified eye: Secondary | ICD-10-CM | POA: Diagnosis not present

## 2013-03-12 DIAGNOSIS — H4300 Vitreous prolapse, unspecified eye: Secondary | ICD-10-CM | POA: Diagnosis not present

## 2013-03-12 DIAGNOSIS — Z9861 Coronary angioplasty status: Secondary | ICD-10-CM | POA: Diagnosis not present

## 2013-03-12 DIAGNOSIS — H52209 Unspecified astigmatism, unspecified eye: Secondary | ICD-10-CM | POA: Diagnosis not present

## 2013-03-13 DIAGNOSIS — H59029 Cataract (lens) fragments in eye following cataract surgery, unspecified eye: Secondary | ICD-10-CM | POA: Diagnosis not present

## 2013-03-13 DIAGNOSIS — Y839 Surgical procedure, unspecified as the cause of abnormal reaction of the patient, or of later complication, without mention of misadventure at the time of the procedure: Secondary | ICD-10-CM | POA: Diagnosis not present

## 2013-03-13 DIAGNOSIS — H4050X0 Glaucoma secondary to other eye disorders, unspecified eye, stage unspecified: Secondary | ICD-10-CM | POA: Diagnosis not present

## 2013-03-13 DIAGNOSIS — H2510 Age-related nuclear cataract, unspecified eye: Secondary | ICD-10-CM | POA: Diagnosis not present

## 2013-05-22 DIAGNOSIS — I509 Heart failure, unspecified: Secondary | ICD-10-CM | POA: Diagnosis not present

## 2013-05-22 DIAGNOSIS — I10 Essential (primary) hypertension: Secondary | ICD-10-CM | POA: Diagnosis not present

## 2013-05-22 DIAGNOSIS — E119 Type 2 diabetes mellitus without complications: Secondary | ICD-10-CM | POA: Diagnosis not present

## 2013-05-22 DIAGNOSIS — N189 Chronic kidney disease, unspecified: Secondary | ICD-10-CM | POA: Diagnosis not present

## 2013-05-22 DIAGNOSIS — E7800 Pure hypercholesterolemia, unspecified: Secondary | ICD-10-CM | POA: Diagnosis not present

## 2013-05-22 DIAGNOSIS — I259 Chronic ischemic heart disease, unspecified: Secondary | ICD-10-CM | POA: Diagnosis not present

## 2013-05-29 DIAGNOSIS — G4733 Obstructive sleep apnea (adult) (pediatric): Secondary | ICD-10-CM | POA: Diagnosis not present

## 2013-05-29 DIAGNOSIS — I10 Essential (primary) hypertension: Secondary | ICD-10-CM | POA: Diagnosis not present

## 2013-05-29 DIAGNOSIS — I259 Chronic ischemic heart disease, unspecified: Secondary | ICD-10-CM | POA: Diagnosis not present

## 2013-05-29 DIAGNOSIS — Z23 Encounter for immunization: Secondary | ICD-10-CM | POA: Diagnosis not present

## 2013-05-29 DIAGNOSIS — IMO0001 Reserved for inherently not codable concepts without codable children: Secondary | ICD-10-CM | POA: Diagnosis not present

## 2013-05-29 DIAGNOSIS — I509 Heart failure, unspecified: Secondary | ICD-10-CM | POA: Diagnosis not present

## 2013-05-29 DIAGNOSIS — E7800 Pure hypercholesterolemia, unspecified: Secondary | ICD-10-CM | POA: Diagnosis not present

## 2013-09-20 DIAGNOSIS — N189 Chronic kidney disease, unspecified: Secondary | ICD-10-CM | POA: Diagnosis not present

## 2013-09-20 DIAGNOSIS — I509 Heart failure, unspecified: Secondary | ICD-10-CM | POA: Diagnosis not present

## 2013-09-20 DIAGNOSIS — I10 Essential (primary) hypertension: Secondary | ICD-10-CM | POA: Diagnosis not present

## 2013-09-20 DIAGNOSIS — E7800 Pure hypercholesterolemia, unspecified: Secondary | ICD-10-CM | POA: Diagnosis not present

## 2013-09-20 DIAGNOSIS — IMO0001 Reserved for inherently not codable concepts without codable children: Secondary | ICD-10-CM | POA: Diagnosis not present

## 2013-09-27 DIAGNOSIS — E7800 Pure hypercholesterolemia, unspecified: Secondary | ICD-10-CM | POA: Diagnosis not present

## 2013-09-27 DIAGNOSIS — I509 Heart failure, unspecified: Secondary | ICD-10-CM | POA: Diagnosis not present

## 2013-09-27 DIAGNOSIS — I259 Chronic ischemic heart disease, unspecified: Secondary | ICD-10-CM | POA: Diagnosis not present

## 2013-09-27 DIAGNOSIS — IMO0001 Reserved for inherently not codable concepts without codable children: Secondary | ICD-10-CM | POA: Diagnosis not present

## 2013-09-27 DIAGNOSIS — I10 Essential (primary) hypertension: Secondary | ICD-10-CM | POA: Diagnosis not present

## 2013-09-27 DIAGNOSIS — G4733 Obstructive sleep apnea (adult) (pediatric): Secondary | ICD-10-CM | POA: Diagnosis not present

## 2013-10-03 DIAGNOSIS — B351 Tinea unguium: Secondary | ICD-10-CM | POA: Diagnosis not present

## 2013-10-03 DIAGNOSIS — E1149 Type 2 diabetes mellitus with other diabetic neurological complication: Secondary | ICD-10-CM | POA: Diagnosis not present

## 2013-10-03 DIAGNOSIS — E119 Type 2 diabetes mellitus without complications: Secondary | ICD-10-CM | POA: Diagnosis not present

## 2014-01-29 DIAGNOSIS — I10 Essential (primary) hypertension: Secondary | ICD-10-CM | POA: Diagnosis not present

## 2014-01-29 DIAGNOSIS — I252 Old myocardial infarction: Secondary | ICD-10-CM | POA: Diagnosis not present

## 2014-01-29 DIAGNOSIS — Z87891 Personal history of nicotine dependence: Secondary | ICD-10-CM | POA: Diagnosis not present

## 2014-01-29 DIAGNOSIS — N201 Calculus of ureter: Secondary | ICD-10-CM | POA: Diagnosis not present

## 2014-01-29 DIAGNOSIS — N281 Cyst of kidney, acquired: Secondary | ICD-10-CM | POA: Diagnosis not present

## 2014-01-29 DIAGNOSIS — E780 Pure hypercholesterolemia: Secondary | ICD-10-CM | POA: Diagnosis not present

## 2014-01-29 DIAGNOSIS — I2510 Atherosclerotic heart disease of native coronary artery without angina pectoris: Secondary | ICD-10-CM | POA: Diagnosis not present

## 2014-01-29 DIAGNOSIS — N1330 Unspecified hydronephrosis: Secondary | ICD-10-CM | POA: Diagnosis not present

## 2014-01-29 DIAGNOSIS — N132 Hydronephrosis with renal and ureteral calculous obstruction: Secondary | ICD-10-CM | POA: Diagnosis not present

## 2014-01-29 DIAGNOSIS — E119 Type 2 diabetes mellitus without complications: Secondary | ICD-10-CM | POA: Diagnosis not present

## 2014-01-29 DIAGNOSIS — N29 Other disorders of kidney and ureter in diseases classified elsewhere: Secondary | ICD-10-CM | POA: Diagnosis not present

## 2014-01-31 DIAGNOSIS — N401 Enlarged prostate with lower urinary tract symptoms: Secondary | ICD-10-CM | POA: Diagnosis not present

## 2014-01-31 DIAGNOSIS — R3915 Urgency of urination: Secondary | ICD-10-CM | POA: Diagnosis not present

## 2014-01-31 DIAGNOSIS — N201 Calculus of ureter: Secondary | ICD-10-CM | POA: Diagnosis not present

## 2014-01-31 DIAGNOSIS — N3941 Urge incontinence: Secondary | ICD-10-CM | POA: Diagnosis not present

## 2014-02-01 ENCOUNTER — Other Ambulatory Visit: Payer: Self-pay

## 2014-02-04 DIAGNOSIS — N401 Enlarged prostate with lower urinary tract symptoms: Secondary | ICD-10-CM | POA: Diagnosis not present

## 2014-02-04 DIAGNOSIS — N201 Calculus of ureter: Secondary | ICD-10-CM | POA: Diagnosis not present

## 2014-02-04 DIAGNOSIS — R3915 Urgency of urination: Secondary | ICD-10-CM | POA: Diagnosis not present

## 2014-02-04 DIAGNOSIS — N3941 Urge incontinence: Secondary | ICD-10-CM | POA: Diagnosis not present

## 2014-02-24 DIAGNOSIS — R109 Unspecified abdominal pain: Secondary | ICD-10-CM | POA: Diagnosis not present

## 2014-02-24 DIAGNOSIS — E780 Pure hypercholesterolemia: Secondary | ICD-10-CM | POA: Diagnosis not present

## 2014-02-24 DIAGNOSIS — E119 Type 2 diabetes mellitus without complications: Secondary | ICD-10-CM | POA: Diagnosis not present

## 2014-02-24 DIAGNOSIS — N201 Calculus of ureter: Secondary | ICD-10-CM | POA: Diagnosis not present

## 2014-02-24 DIAGNOSIS — I10 Essential (primary) hypertension: Secondary | ICD-10-CM | POA: Diagnosis not present

## 2014-02-24 DIAGNOSIS — R110 Nausea: Secondary | ICD-10-CM | POA: Diagnosis not present

## 2014-02-24 DIAGNOSIS — N281 Cyst of kidney, acquired: Secondary | ICD-10-CM | POA: Diagnosis not present

## 2014-02-24 DIAGNOSIS — K5730 Diverticulosis of large intestine without perforation or abscess without bleeding: Secondary | ICD-10-CM | POA: Diagnosis not present

## 2014-02-24 DIAGNOSIS — I2510 Atherosclerotic heart disease of native coronary artery without angina pectoris: Secondary | ICD-10-CM | POA: Diagnosis not present

## 2014-02-24 DIAGNOSIS — N200 Calculus of kidney: Secondary | ICD-10-CM | POA: Diagnosis not present

## 2014-02-25 DIAGNOSIS — R3915 Urgency of urination: Secondary | ICD-10-CM | POA: Diagnosis not present

## 2014-02-25 DIAGNOSIS — N200 Calculus of kidney: Secondary | ICD-10-CM | POA: Diagnosis not present

## 2014-02-25 DIAGNOSIS — E119 Type 2 diabetes mellitus without complications: Secondary | ICD-10-CM | POA: Diagnosis not present

## 2014-02-25 DIAGNOSIS — N23 Unspecified renal colic: Secondary | ICD-10-CM | POA: Diagnosis not present

## 2014-02-26 DIAGNOSIS — Z01810 Encounter for preprocedural cardiovascular examination: Secondary | ICD-10-CM | POA: Diagnosis not present

## 2014-02-26 DIAGNOSIS — Z01812 Encounter for preprocedural laboratory examination: Secondary | ICD-10-CM | POA: Diagnosis not present

## 2014-02-27 DIAGNOSIS — I10 Essential (primary) hypertension: Secondary | ICD-10-CM | POA: Diagnosis not present

## 2014-02-27 DIAGNOSIS — R3915 Urgency of urination: Secondary | ICD-10-CM | POA: Diagnosis not present

## 2014-02-27 DIAGNOSIS — R312 Other microscopic hematuria: Secondary | ICD-10-CM | POA: Diagnosis not present

## 2014-02-27 DIAGNOSIS — N23 Unspecified renal colic: Secondary | ICD-10-CM | POA: Diagnosis not present

## 2014-02-27 DIAGNOSIS — N202 Calculus of kidney with calculus of ureter: Secondary | ICD-10-CM | POA: Diagnosis not present

## 2014-02-27 DIAGNOSIS — N132 Hydronephrosis with renal and ureteral calculous obstruction: Secondary | ICD-10-CM | POA: Diagnosis not present

## 2014-02-27 DIAGNOSIS — N200 Calculus of kidney: Secondary | ICD-10-CM | POA: Diagnosis not present

## 2014-02-27 DIAGNOSIS — N401 Enlarged prostate with lower urinary tract symptoms: Secondary | ICD-10-CM | POA: Diagnosis not present

## 2014-02-27 DIAGNOSIS — I2510 Atherosclerotic heart disease of native coronary artery without angina pectoris: Secondary | ICD-10-CM | POA: Diagnosis not present

## 2014-02-27 DIAGNOSIS — S3720XA Unspecified injury of bladder, initial encounter: Secondary | ICD-10-CM | POA: Diagnosis not present

## 2014-02-27 DIAGNOSIS — I252 Old myocardial infarction: Secondary | ICD-10-CM | POA: Diagnosis not present

## 2014-02-27 DIAGNOSIS — K219 Gastro-esophageal reflux disease without esophagitis: Secondary | ICD-10-CM | POA: Diagnosis not present

## 2014-02-27 DIAGNOSIS — R351 Nocturia: Secondary | ICD-10-CM | POA: Diagnosis not present

## 2014-02-27 DIAGNOSIS — N359 Urethral stricture, unspecified: Secondary | ICD-10-CM | POA: Diagnosis not present

## 2014-02-27 DIAGNOSIS — E785 Hyperlipidemia, unspecified: Secondary | ICD-10-CM | POA: Diagnosis not present

## 2014-02-27 DIAGNOSIS — N201 Calculus of ureter: Secondary | ICD-10-CM | POA: Diagnosis not present

## 2014-02-27 DIAGNOSIS — E119 Type 2 diabetes mellitus without complications: Secondary | ICD-10-CM | POA: Diagnosis not present

## 2014-02-27 DIAGNOSIS — N9961 Intraoperative hemorrhage and hematoma of a genitourinary system organ or structure complicating a genitourinary system procedure: Secondary | ICD-10-CM | POA: Diagnosis not present

## 2014-03-05 DIAGNOSIS — E119 Type 2 diabetes mellitus without complications: Secondary | ICD-10-CM | POA: Diagnosis not present

## 2014-03-05 DIAGNOSIS — R351 Nocturia: Secondary | ICD-10-CM | POA: Diagnosis not present

## 2014-03-05 DIAGNOSIS — N401 Enlarged prostate with lower urinary tract symptoms: Secondary | ICD-10-CM | POA: Diagnosis not present

## 2014-03-05 DIAGNOSIS — N200 Calculus of kidney: Secondary | ICD-10-CM | POA: Diagnosis not present

## 2014-03-17 DIAGNOSIS — E860 Dehydration: Secondary | ICD-10-CM | POA: Diagnosis not present

## 2014-03-17 DIAGNOSIS — I252 Old myocardial infarction: Secondary | ICD-10-CM | POA: Diagnosis not present

## 2014-03-17 DIAGNOSIS — I429 Cardiomyopathy, unspecified: Secondary | ICD-10-CM | POA: Diagnosis not present

## 2014-03-17 DIAGNOSIS — K4020 Bilateral inguinal hernia, without obstruction or gangrene, not specified as recurrent: Secondary | ICD-10-CM | POA: Diagnosis present

## 2014-03-17 DIAGNOSIS — N132 Hydronephrosis with renal and ureteral calculous obstruction: Secondary | ICD-10-CM | POA: Diagnosis not present

## 2014-03-17 DIAGNOSIS — R197 Diarrhea, unspecified: Secondary | ICD-10-CM | POA: Diagnosis not present

## 2014-03-17 DIAGNOSIS — I10 Essential (primary) hypertension: Secondary | ICD-10-CM | POA: Diagnosis not present

## 2014-03-17 DIAGNOSIS — R05 Cough: Secondary | ICD-10-CM | POA: Diagnosis not present

## 2014-03-17 DIAGNOSIS — N390 Urinary tract infection, site not specified: Secondary | ICD-10-CM | POA: Diagnosis not present

## 2014-03-17 DIAGNOSIS — Z7982 Long term (current) use of aspirin: Secondary | ICD-10-CM | POA: Diagnosis not present

## 2014-03-17 DIAGNOSIS — Z6841 Body Mass Index (BMI) 40.0 and over, adult: Secondary | ICD-10-CM | POA: Diagnosis not present

## 2014-03-17 DIAGNOSIS — E119 Type 2 diabetes mellitus without complications: Secondary | ICD-10-CM | POA: Diagnosis not present

## 2014-03-17 DIAGNOSIS — N201 Calculus of ureter: Secondary | ICD-10-CM | POA: Diagnosis not present

## 2014-03-17 DIAGNOSIS — Z955 Presence of coronary angioplasty implant and graft: Secondary | ICD-10-CM | POA: Diagnosis not present

## 2014-03-17 DIAGNOSIS — N401 Enlarged prostate with lower urinary tract symptoms: Secondary | ICD-10-CM | POA: Diagnosis not present

## 2014-03-17 DIAGNOSIS — E785 Hyperlipidemia, unspecified: Secondary | ICD-10-CM | POA: Diagnosis present

## 2014-03-17 DIAGNOSIS — N202 Calculus of kidney with calculus of ureter: Secondary | ICD-10-CM | POA: Diagnosis not present

## 2014-03-17 DIAGNOSIS — K219 Gastro-esophageal reflux disease without esophagitis: Secondary | ICD-10-CM | POA: Diagnosis not present

## 2014-03-17 DIAGNOSIS — E861 Hypovolemia: Secondary | ICD-10-CM | POA: Diagnosis present

## 2014-03-17 DIAGNOSIS — I509 Heart failure, unspecified: Secondary | ICD-10-CM | POA: Diagnosis not present

## 2014-03-17 DIAGNOSIS — N200 Calculus of kidney: Secondary | ICD-10-CM | POA: Diagnosis not present

## 2014-03-17 DIAGNOSIS — R112 Nausea with vomiting, unspecified: Secondary | ICD-10-CM | POA: Diagnosis not present

## 2014-03-17 DIAGNOSIS — R351 Nocturia: Secondary | ICD-10-CM | POA: Diagnosis not present

## 2014-03-17 DIAGNOSIS — N400 Enlarged prostate without lower urinary tract symptoms: Secondary | ICD-10-CM | POA: Diagnosis present

## 2014-04-01 DIAGNOSIS — R112 Nausea with vomiting, unspecified: Secondary | ICD-10-CM | POA: Diagnosis not present

## 2014-04-01 DIAGNOSIS — K2960 Other gastritis without bleeding: Secondary | ICD-10-CM | POA: Diagnosis not present

## 2014-04-01 DIAGNOSIS — K8020 Calculus of gallbladder without cholecystitis without obstruction: Secondary | ICD-10-CM | POA: Diagnosis not present

## 2014-04-01 DIAGNOSIS — K810 Acute cholecystitis: Secondary | ICD-10-CM | POA: Diagnosis not present

## 2014-04-02 DIAGNOSIS — Z883 Allergy status to other anti-infective agents status: Secondary | ICD-10-CM | POA: Diagnosis not present

## 2014-04-02 DIAGNOSIS — E119 Type 2 diabetes mellitus without complications: Secondary | ICD-10-CM | POA: Diagnosis not present

## 2014-04-02 DIAGNOSIS — Z87891 Personal history of nicotine dependence: Secondary | ICD-10-CM | POA: Diagnosis not present

## 2014-04-02 DIAGNOSIS — K8020 Calculus of gallbladder without cholecystitis without obstruction: Secondary | ICD-10-CM | POA: Diagnosis not present

## 2014-04-02 DIAGNOSIS — Z452 Encounter for adjustment and management of vascular access device: Secondary | ICD-10-CM | POA: Diagnosis not present

## 2014-04-02 DIAGNOSIS — I10 Essential (primary) hypertension: Secondary | ICD-10-CM | POA: Diagnosis present

## 2014-04-02 DIAGNOSIS — N179 Acute kidney failure, unspecified: Secondary | ICD-10-CM | POA: Diagnosis not present

## 2014-04-02 DIAGNOSIS — Z79899 Other long term (current) drug therapy: Secondary | ICD-10-CM | POA: Diagnosis not present

## 2014-04-02 DIAGNOSIS — K2960 Other gastritis without bleeding: Secondary | ICD-10-CM | POA: Diagnosis not present

## 2014-04-02 DIAGNOSIS — D649 Anemia, unspecified: Secondary | ICD-10-CM | POA: Diagnosis not present

## 2014-04-02 DIAGNOSIS — Z96652 Presence of left artificial knee joint: Secondary | ICD-10-CM | POA: Diagnosis present

## 2014-04-02 DIAGNOSIS — Z6841 Body Mass Index (BMI) 40.0 and over, adult: Secondary | ICD-10-CM | POA: Diagnosis not present

## 2014-04-02 DIAGNOSIS — Z87442 Personal history of urinary calculi: Secondary | ICD-10-CM | POA: Diagnosis not present

## 2014-04-02 DIAGNOSIS — Z886 Allergy status to analgesic agent status: Secondary | ICD-10-CM | POA: Diagnosis not present

## 2014-04-02 DIAGNOSIS — E785 Hyperlipidemia, unspecified: Secondary | ICD-10-CM | POA: Diagnosis present

## 2014-04-02 DIAGNOSIS — A049 Bacterial intestinal infection, unspecified: Secondary | ICD-10-CM | POA: Diagnosis not present

## 2014-04-02 DIAGNOSIS — B999 Unspecified infectious disease: Secondary | ICD-10-CM | POA: Diagnosis not present

## 2014-04-02 DIAGNOSIS — R197 Diarrhea, unspecified: Secondary | ICD-10-CM | POA: Diagnosis not present

## 2014-04-02 DIAGNOSIS — K810 Acute cholecystitis: Secondary | ICD-10-CM | POA: Diagnosis not present

## 2014-04-02 DIAGNOSIS — J449 Chronic obstructive pulmonary disease, unspecified: Secondary | ICD-10-CM | POA: Diagnosis not present

## 2014-04-02 DIAGNOSIS — I509 Heart failure, unspecified: Secondary | ICD-10-CM | POA: Diagnosis not present

## 2014-04-02 DIAGNOSIS — R109 Unspecified abdominal pain: Secondary | ICD-10-CM | POA: Diagnosis not present

## 2014-04-02 DIAGNOSIS — K529 Noninfective gastroenteritis and colitis, unspecified: Secondary | ICD-10-CM | POA: Diagnosis not present

## 2014-04-02 DIAGNOSIS — R110 Nausea: Secondary | ICD-10-CM | POA: Diagnosis not present

## 2014-04-02 DIAGNOSIS — R1030 Lower abdominal pain, unspecified: Secondary | ICD-10-CM | POA: Diagnosis not present

## 2014-04-02 DIAGNOSIS — Z7982 Long term (current) use of aspirin: Secondary | ICD-10-CM | POA: Diagnosis not present

## 2014-04-02 DIAGNOSIS — N200 Calculus of kidney: Secondary | ICD-10-CM | POA: Diagnosis not present

## 2014-04-15 DIAGNOSIS — K2960 Other gastritis without bleeding: Secondary | ICD-10-CM | POA: Diagnosis not present

## 2014-04-23 DIAGNOSIS — R197 Diarrhea, unspecified: Secondary | ICD-10-CM | POA: Diagnosis not present

## 2014-04-23 DIAGNOSIS — G4700 Insomnia, unspecified: Secondary | ICD-10-CM | POA: Diagnosis not present

## 2014-04-23 DIAGNOSIS — G4733 Obstructive sleep apnea (adult) (pediatric): Secondary | ICD-10-CM | POA: Diagnosis not present

## 2014-05-29 DIAGNOSIS — B370 Candidal stomatitis: Secondary | ICD-10-CM | POA: Diagnosis not present

## 2014-05-30 DIAGNOSIS — I10 Essential (primary) hypertension: Secondary | ICD-10-CM | POA: Diagnosis not present

## 2014-05-30 DIAGNOSIS — E780 Pure hypercholesterolemia: Secondary | ICD-10-CM | POA: Diagnosis not present

## 2014-05-30 DIAGNOSIS — E1165 Type 2 diabetes mellitus with hyperglycemia: Secondary | ICD-10-CM | POA: Diagnosis not present

## 2014-05-30 DIAGNOSIS — K210 Gastro-esophageal reflux disease with esophagitis: Secondary | ICD-10-CM | POA: Diagnosis not present

## 2014-06-06 DIAGNOSIS — K210 Gastro-esophageal reflux disease with esophagitis: Secondary | ICD-10-CM | POA: Diagnosis not present

## 2014-06-06 DIAGNOSIS — I10 Essential (primary) hypertension: Secondary | ICD-10-CM | POA: Diagnosis not present

## 2014-06-06 DIAGNOSIS — F5101 Primary insomnia: Secondary | ICD-10-CM | POA: Diagnosis not present

## 2014-06-06 DIAGNOSIS — E1165 Type 2 diabetes mellitus with hyperglycemia: Secondary | ICD-10-CM | POA: Diagnosis not present

## 2014-06-06 DIAGNOSIS — Z1389 Encounter for screening for other disorder: Secondary | ICD-10-CM | POA: Diagnosis not present

## 2014-06-06 DIAGNOSIS — N183 Chronic kidney disease, stage 3 (moderate): Secondary | ICD-10-CM | POA: Diagnosis not present

## 2014-06-06 DIAGNOSIS — I259 Chronic ischemic heart disease, unspecified: Secondary | ICD-10-CM | POA: Diagnosis not present

## 2014-09-05 DIAGNOSIS — S29012A Strain of muscle and tendon of back wall of thorax, initial encounter: Secondary | ICD-10-CM | POA: Diagnosis not present

## 2014-09-25 DIAGNOSIS — N183 Chronic kidney disease, stage 3 (moderate): Secondary | ICD-10-CM | POA: Diagnosis not present

## 2014-09-25 DIAGNOSIS — E1165 Type 2 diabetes mellitus with hyperglycemia: Secondary | ICD-10-CM | POA: Diagnosis not present

## 2014-09-25 DIAGNOSIS — I10 Essential (primary) hypertension: Secondary | ICD-10-CM | POA: Diagnosis not present

## 2014-09-25 DIAGNOSIS — E780 Pure hypercholesterolemia: Secondary | ICD-10-CM | POA: Diagnosis not present

## 2014-10-14 ENCOUNTER — Other Ambulatory Visit: Payer: Self-pay

## 2014-10-14 DIAGNOSIS — I259 Chronic ischemic heart disease, unspecified: Secondary | ICD-10-CM | POA: Diagnosis not present

## 2014-10-14 DIAGNOSIS — G4733 Obstructive sleep apnea (adult) (pediatric): Secondary | ICD-10-CM | POA: Diagnosis not present

## 2014-10-14 DIAGNOSIS — F5101 Primary insomnia: Secondary | ICD-10-CM | POA: Diagnosis not present

## 2014-10-14 DIAGNOSIS — E1165 Type 2 diabetes mellitus with hyperglycemia: Secondary | ICD-10-CM | POA: Diagnosis not present

## 2014-10-14 DIAGNOSIS — G629 Polyneuropathy, unspecified: Secondary | ICD-10-CM | POA: Diagnosis not present

## 2014-10-14 DIAGNOSIS — E1143 Type 2 diabetes mellitus with diabetic autonomic (poly)neuropathy: Secondary | ICD-10-CM | POA: Diagnosis not present

## 2014-10-14 DIAGNOSIS — N183 Chronic kidney disease, stage 3 (moderate): Secondary | ICD-10-CM | POA: Diagnosis not present

## 2014-10-14 DIAGNOSIS — I10 Essential (primary) hypertension: Secondary | ICD-10-CM | POA: Diagnosis not present

## 2014-10-14 DIAGNOSIS — K210 Gastro-esophageal reflux disease with esophagitis: Secondary | ICD-10-CM | POA: Diagnosis not present

## 2014-10-17 DIAGNOSIS — L110 Acquired keratosis follicularis: Secondary | ICD-10-CM | POA: Diagnosis not present

## 2014-10-17 DIAGNOSIS — B351 Tinea unguium: Secondary | ICD-10-CM | POA: Diagnosis not present

## 2014-10-17 DIAGNOSIS — E1140 Type 2 diabetes mellitus with diabetic neuropathy, unspecified: Secondary | ICD-10-CM | POA: Diagnosis not present

## 2014-12-24 DIAGNOSIS — M545 Low back pain: Secondary | ICD-10-CM | POA: Diagnosis not present

## 2014-12-24 DIAGNOSIS — M7062 Trochanteric bursitis, left hip: Secondary | ICD-10-CM | POA: Diagnosis not present

## 2015-01-23 DIAGNOSIS — I10 Essential (primary) hypertension: Secondary | ICD-10-CM | POA: Diagnosis not present

## 2015-01-23 DIAGNOSIS — E1140 Type 2 diabetes mellitus with diabetic neuropathy, unspecified: Secondary | ICD-10-CM | POA: Diagnosis not present

## 2015-01-23 DIAGNOSIS — E7800 Pure hypercholesterolemia, unspecified: Secondary | ICD-10-CM | POA: Diagnosis not present

## 2015-01-23 DIAGNOSIS — L609 Nail disorder, unspecified: Secondary | ICD-10-CM | POA: Diagnosis not present

## 2015-01-23 DIAGNOSIS — L89892 Pressure ulcer of other site, stage 2: Secondary | ICD-10-CM | POA: Diagnosis not present

## 2015-01-23 DIAGNOSIS — L110 Acquired keratosis follicularis: Secondary | ICD-10-CM | POA: Diagnosis not present

## 2015-01-23 DIAGNOSIS — E1165 Type 2 diabetes mellitus with hyperglycemia: Secondary | ICD-10-CM | POA: Diagnosis not present

## 2015-01-23 DIAGNOSIS — K210 Gastro-esophageal reflux disease with esophagitis: Secondary | ICD-10-CM | POA: Diagnosis not present

## 2015-01-23 DIAGNOSIS — N183 Chronic kidney disease, stage 3 (moderate): Secondary | ICD-10-CM | POA: Diagnosis not present

## 2015-01-27 DIAGNOSIS — Z23 Encounter for immunization: Secondary | ICD-10-CM | POA: Diagnosis not present

## 2015-01-27 DIAGNOSIS — G629 Polyneuropathy, unspecified: Secondary | ICD-10-CM | POA: Diagnosis not present

## 2015-01-27 DIAGNOSIS — K210 Gastro-esophageal reflux disease with esophagitis: Secondary | ICD-10-CM | POA: Diagnosis not present

## 2015-01-27 DIAGNOSIS — F5101 Primary insomnia: Secondary | ICD-10-CM | POA: Diagnosis not present

## 2015-01-27 DIAGNOSIS — I259 Chronic ischemic heart disease, unspecified: Secondary | ICD-10-CM | POA: Diagnosis not present

## 2015-01-27 DIAGNOSIS — E1143 Type 2 diabetes mellitus with diabetic autonomic (poly)neuropathy: Secondary | ICD-10-CM | POA: Diagnosis not present

## 2015-01-27 DIAGNOSIS — E1165 Type 2 diabetes mellitus with hyperglycemia: Secondary | ICD-10-CM | POA: Diagnosis not present

## 2015-01-27 DIAGNOSIS — G4733 Obstructive sleep apnea (adult) (pediatric): Secondary | ICD-10-CM | POA: Diagnosis not present

## 2015-01-27 DIAGNOSIS — N183 Chronic kidney disease, stage 3 (moderate): Secondary | ICD-10-CM | POA: Diagnosis not present

## 2015-01-27 DIAGNOSIS — I10 Essential (primary) hypertension: Secondary | ICD-10-CM | POA: Diagnosis not present

## 2015-02-05 DIAGNOSIS — H2513 Age-related nuclear cataract, bilateral: Secondary | ICD-10-CM | POA: Diagnosis not present

## 2015-02-05 DIAGNOSIS — E119 Type 2 diabetes mellitus without complications: Secondary | ICD-10-CM | POA: Diagnosis not present

## 2015-03-10 DIAGNOSIS — J0101 Acute recurrent maxillary sinusitis: Secondary | ICD-10-CM | POA: Diagnosis not present

## 2015-04-07 DIAGNOSIS — A084 Viral intestinal infection, unspecified: Secondary | ICD-10-CM | POA: Diagnosis not present

## 2015-04-07 DIAGNOSIS — J069 Acute upper respiratory infection, unspecified: Secondary | ICD-10-CM | POA: Diagnosis not present

## 2015-04-17 DIAGNOSIS — E1140 Type 2 diabetes mellitus with diabetic neuropathy, unspecified: Secondary | ICD-10-CM | POA: Diagnosis not present

## 2015-04-17 DIAGNOSIS — L110 Acquired keratosis follicularis: Secondary | ICD-10-CM | POA: Diagnosis not present

## 2015-04-17 DIAGNOSIS — B351 Tinea unguium: Secondary | ICD-10-CM | POA: Diagnosis not present

## 2015-05-16 ENCOUNTER — Encounter: Payer: Self-pay | Admitting: *Deleted

## 2015-05-26 DIAGNOSIS — I10 Essential (primary) hypertension: Secondary | ICD-10-CM | POA: Diagnosis not present

## 2015-05-26 DIAGNOSIS — E1143 Type 2 diabetes mellitus with diabetic autonomic (poly)neuropathy: Secondary | ICD-10-CM | POA: Diagnosis not present

## 2015-05-26 DIAGNOSIS — N183 Chronic kidney disease, stage 3 (moderate): Secondary | ICD-10-CM | POA: Diagnosis not present

## 2015-05-26 DIAGNOSIS — E7800 Pure hypercholesterolemia, unspecified: Secondary | ICD-10-CM | POA: Diagnosis not present

## 2015-05-26 DIAGNOSIS — K210 Gastro-esophageal reflux disease with esophagitis: Secondary | ICD-10-CM | POA: Diagnosis not present

## 2015-05-30 DIAGNOSIS — T1490 Injury, unspecified: Secondary | ICD-10-CM | POA: Diagnosis not present

## 2015-06-02 DIAGNOSIS — F5101 Primary insomnia: Secondary | ICD-10-CM | POA: Diagnosis not present

## 2015-06-02 DIAGNOSIS — N183 Chronic kidney disease, stage 3 (moderate): Secondary | ICD-10-CM | POA: Diagnosis not present

## 2015-06-02 DIAGNOSIS — E1143 Type 2 diabetes mellitus with diabetic autonomic (poly)neuropathy: Secondary | ICD-10-CM | POA: Diagnosis not present

## 2015-06-02 DIAGNOSIS — G4733 Obstructive sleep apnea (adult) (pediatric): Secondary | ICD-10-CM | POA: Diagnosis not present

## 2015-06-02 DIAGNOSIS — K210 Gastro-esophageal reflux disease with esophagitis: Secondary | ICD-10-CM | POA: Diagnosis not present

## 2015-06-02 DIAGNOSIS — G629 Polyneuropathy, unspecified: Secondary | ICD-10-CM | POA: Diagnosis not present

## 2015-06-02 DIAGNOSIS — I259 Chronic ischemic heart disease, unspecified: Secondary | ICD-10-CM | POA: Diagnosis not present

## 2015-06-02 DIAGNOSIS — E1165 Type 2 diabetes mellitus with hyperglycemia: Secondary | ICD-10-CM | POA: Diagnosis not present

## 2015-06-02 DIAGNOSIS — I10 Essential (primary) hypertension: Secondary | ICD-10-CM | POA: Diagnosis not present

## 2015-06-03 DIAGNOSIS — E7800 Pure hypercholesterolemia, unspecified: Secondary | ICD-10-CM | POA: Diagnosis not present

## 2015-06-03 DIAGNOSIS — E1143 Type 2 diabetes mellitus with diabetic autonomic (poly)neuropathy: Secondary | ICD-10-CM | POA: Diagnosis not present

## 2015-06-03 DIAGNOSIS — I10 Essential (primary) hypertension: Secondary | ICD-10-CM | POA: Diagnosis not present

## 2015-06-03 DIAGNOSIS — K210 Gastro-esophageal reflux disease with esophagitis: Secondary | ICD-10-CM | POA: Diagnosis not present

## 2015-06-03 DIAGNOSIS — N183 Chronic kidney disease, stage 3 (moderate): Secondary | ICD-10-CM | POA: Diagnosis not present

## 2015-06-05 ENCOUNTER — Encounter: Payer: Self-pay | Admitting: *Deleted

## 2015-06-06 ENCOUNTER — Ambulatory Visit (INDEPENDENT_AMBULATORY_CARE_PROVIDER_SITE_OTHER): Payer: Medicare Other | Admitting: Cardiovascular Disease

## 2015-06-06 ENCOUNTER — Encounter: Payer: Self-pay | Admitting: Cardiovascular Disease

## 2015-06-06 VITALS — BP 124/80 | HR 91 | Ht 68.0 in | Wt 286.0 lb

## 2015-06-06 DIAGNOSIS — Z136 Encounter for screening for cardiovascular disorders: Secondary | ICD-10-CM

## 2015-06-06 DIAGNOSIS — E785 Hyperlipidemia, unspecified: Secondary | ICD-10-CM

## 2015-06-06 DIAGNOSIS — I10 Essential (primary) hypertension: Secondary | ICD-10-CM | POA: Diagnosis not present

## 2015-06-06 DIAGNOSIS — I519 Heart disease, unspecified: Secondary | ICD-10-CM

## 2015-06-06 DIAGNOSIS — I25118 Atherosclerotic heart disease of native coronary artery with other forms of angina pectoris: Secondary | ICD-10-CM | POA: Diagnosis not present

## 2015-06-06 NOTE — Patient Instructions (Signed)
Your physician recommends that you continue on your current medications as directed. Please refer to the Current Medication list given to you today. Your physician recommends that you schedule a follow-up appointment in: 6 months. You will receive a reminder letter in the mail in about 4 months reminding you to call and schedule your appointment. If you don't receive this letter, please contact our office. 

## 2015-06-06 NOTE — Progress Notes (Signed)
Patient ID: Noah [name], male   DOB: March 31, 1941, 75 y.o.   MRN: [id number]       CARDIOLOGY CONSULT NOTE  Patient ID: Noah [name] MRN: [id number] DOB/AGE: [birth year] 75 y.o.  Admit date: (Not on file) Primary Physician Estanislado Pandy, MD  Reason for Consultation: CAD  HPI: The patient is a 75 year old male who I am evaluating for the first time. He has a history of coronary artery disease with prior stent placement, diabetes mellitus, hypertension , and hyperlipidemia.  He underwent coronary angiography on 05/30/2006 and underwent percutaneous transluminal coronary angioplasty and drug-eluting stent placement to the circumflex. 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 At that time ejection fraction was 35-40%.  Lipids on 05/26/15 showed total cholesterol 142, triglycerides 196, HDL 32, LDL 71.  ECG performed in the office today demonstrates normal sinus rhythm with first-degree AV block and an old left bundle-branch block.  He believes he has undergone coronary angiography since 2008 by Dr. Excell Seltzer but I cannot locate this report at the present time. He also believes he underwent an echocardiogram in [place], [place] in 2015.   He currently denies chest pain and no worsening of baseline exertional dyspnea. He also denies orthopnea, leg swelling, and paroxysmal nocturnal dyspnea. Has some varicose veins in his legs.  He has lost 110 pounds by dieting.  Soc: Born in [place], [place]. Lived in [place], [place], [place], and now [place].  Allergies  Allergen Reactions  . Codeine Other (See Comments)    constipation  . Erythromycin-Sulfisoxazole Other (See Comments)    Causes infection in throat and eyes    Current Outpatient Prescriptions  Medication Sig Dispense Refill  . ALPRAZolam (XANAX) 0.5 MG tablet Take 0.5 mg by mouth at bedtime as needed for sleep.    Marland Kitchen aspirin 325 MG EC tablet Take 325 mg  by mouth daily.    Marland Kitchen CINNAMON PO Take 1,000 mg by mouth.    . Coenzyme Q10 (COQ10) 100 MG CAPS Take 1 capsule by mouth every evening.    Marland Kitchen FLUoxetine (PROZAC) 20 MG capsule Take 20 mg by mouth every evening.    . furosemide (LASIX) 40 MG tablet Take 40 mg by mouth daily.     Marland Kitchen gemfibrozil (LOPID) 600 MG tablet Take 600 mg by mouth 2 (two) times daily before a meal.    . glipiZIDE (GLUCOTROL) 10 MG tablet Take 10 mg by mouth 2 (two) times daily.    Marland Kitchen glucose blood (ONE TOUCH ULTRA TEST) test strip Check your sugars twice a day 100 each 12  . Insulin Pen Needle (NOVOFINE) 30G X 8 MM MISC Inject 10 each into the skin as needed. 90 each 3  . isosorbide dinitrate (ISORDIL) 30 MG tablet Take 30 mg by mouth every evening.    . Liraglutide (VICTOZA) 18 MG/3ML SOLN injection Inject 0.1 mLs (0.6 mg total) into the skin daily. 9 mL 3  . Melatonin 5 MG TABS Take 1 tablet by mouth at bedtime.    . metFORMIN (GLUCOPHAGE) 500 MG tablet Take by mouth. 2 & 1/2 tabs daily    . metoprolol (TOPROL-XL) 200 MG 24 hr tablet Take 100 mg by mouth 2 (two) times daily.    . Multiple Vitamin (MULTIVITAMIN) capsule Take 1 capsule by mouth every evening.    . niacin 500 MG tablet Take 500 mg by mouth daily with breakfast.    .  Omega-3 Fatty Acids (FISH OIL) 1000 MG CAPS Take 1 capsule by mouth 2 (two) times daily.    Marland Kitchen omeprazole (PRILOSEC) 20 MG capsule Take 20 mg by mouth daily.    . pioglitazone (ACTOS) 45 MG tablet Take by mouth daily. 1/2 tab daily    . ramipril (ALTACE) 5 MG capsule Take 10 mg by mouth daily.     . ranitidine (ZANTAC) 150 MG tablet Take 150 mg by mouth at bedtime.    . simvastatin (ZOCOR) 40 MG tablet Take 40 mg by mouth daily.    . Tamsulosin HCl (FLOMAX) 0.4 MG CAPS Take 0.8 mg by mouth every evening.    . zolpidem (AMBIEN) 10 MG tablet Take 10 mg by mouth at bedtime as needed for sleep.     No current facility-administered medications for this visit.    Past Medical History  Diagnosis Date    . Hyperlipidemia   . Dyspnea   . Obesity   . Hypertension   . Chronic ischemic heart disease, unspecified   . Gastro-esophageal reflux disease with esophagitis   . Chronic kidney disease, stage III (moderate)   . Morbid obesity (HCC)   . Primary insomnia   . Obstructive sleep apnea     on CPAP  . Polyneuropathy (HCC)   . Diabetes mellitus without complication (HCC)     with hyperglycemia and diabetic autonomic poly neuropathy    Past Surgical History  Procedure Laterality Date  . Cardiac catheterization      multiple angioplasty X 8, 4 stents     Social History   Social History  . Marital Status: Married    Spouse Name: N/A  . Number of Children: N/A  . Years of Education: N/A   Occupational History  . Not on file.   Social History Main Topics  . Smoking status: Former Smoker    Quit date: 09/01/2000  . Smokeless tobacco: Never Used  . Alcohol Use: 0.0 oz/week    0 Standard drinks or equivalent per week  . Drug Use: No  . Sexual Activity: Not on file   Other Topics Concern  . Not on file   Social History Narrative     No family history of premature CAD in 1st degree relatives.  Prior to Admission medications   Medication Sig Start Date End Date Taking? Authorizing Provider  ALPRAZolam Prudy Feeler) 0.5 MG tablet Take 0.5 mg by mouth at bedtime as needed for sleep.   Yes Historical Provider, MD  aspirin 325 MG EC tablet Take 325 mg by mouth daily.   Yes Historical Provider, MD  CINNAMON PO Take 1,000 mg by mouth.   Yes Historical Provider, MD  Coenzyme Q10 (COQ10) 100 MG CAPS Take 1 capsule by mouth every evening.   Yes Historical Provider, MD  FLUoxetine (PROZAC) 20 MG capsule Take 20 mg by mouth every evening.   Yes Historical Provider, MD  furosemide (LASIX) 40 MG tablet Take 40 mg by mouth daily.    Yes Historical Provider, MD  gemfibrozil (LOPID) 600 MG tablet Take 600 mg by mouth 2 (two) times daily before a meal.   Yes Historical Provider, MD  glipiZIDE  (GLUCOTROL) 10 MG tablet Take 10 mg by mouth 2 (two) times daily.   Yes Historical Provider, MD  glucose blood (ONE TOUCH ULTRA TEST) test strip Check your sugars twice a day 06/06/12  Yes Carlus Pavlov, MD  Insulin Pen Needle (NOVOFINE) 30G X 8 MM MISC Inject 10 each into the skin  as needed. 06/20/12  Yes Carlus Pavlov, MD  isosorbide dinitrate (ISORDIL) 30 MG tablet Take 30 mg by mouth every evening.   Yes Historical Provider, MD  Liraglutide (VICTOZA) 18 MG/3ML SOLN injection Inject 0.1 mLs (0.6 mg total) into the skin daily. 06/20/12  Yes Carlus Pavlov, MD  Melatonin 5 MG TABS Take 1 tablet by mouth at bedtime.   Yes Historical Provider, MD  metFORMIN (GLUCOPHAGE) 500 MG tablet Take by mouth. 2 & 1/2 tabs daily   Yes Historical Provider, MD  metoprolol (TOPROL-XL) 200 MG 24 hr tablet Take 100 mg by mouth 2 (two) times daily.   Yes Historical Provider, MD  Multiple Vitamin (MULTIVITAMIN) capsule Take 1 capsule by mouth every evening.   Yes Historical Provider, MD  niacin 500 MG tablet Take 500 mg by mouth daily with breakfast.   Yes Historical Provider, MD  Omega-3 Fatty Acids (FISH OIL) 1000 MG CAPS Take 1 capsule by mouth 2 (two) times daily.   Yes Historical Provider, MD  omeprazole (PRILOSEC) 20 MG capsule Take 20 mg by mouth daily.   Yes Historical Provider, MD  pioglitazone (ACTOS) 45 MG tablet Take by mouth daily. 1/2 tab daily   Yes Historical Provider, MD  ramipril (ALTACE) 5 MG capsule Take 10 mg by mouth daily.    Yes Historical Provider, MD  ranitidine (ZANTAC) 150 MG tablet Take 150 mg by mouth at bedtime.   Yes Historical Provider, MD  simvastatin (ZOCOR) 40 MG tablet Take 40 mg by mouth daily.   Yes Historical Provider, MD  Tamsulosin HCl (FLOMAX) 0.4 MG CAPS Take 0.8 mg by mouth every evening.   Yes Historical Provider, MD  zolpidem (AMBIEN) 10 MG tablet Take 10 mg by mouth at bedtime as needed for sleep.   Yes Historical Provider, MD     Review of systems complete and  found to be negative unless listed above in HPI     Physical exam Blood pressure 124/80, pulse 91, height 5\' 8"  (1.727 m), weight 286 lb (129.729 kg), SpO2 95 %. General: NAD Neck: No JVD, no thyromegaly or thyroid nodule.  Lungs: Clear to auscultation bilaterally with normal respiratory effort. CV: Nondisplaced PMI. Regular rate and rhythm, normal S1/S2, no S3/S4, no murmur.  No peripheral edema.  No carotid bruit.  +varicose veins b/l.  Abdomen: Morbidly obese.  Skin: Intact without lesions or rashes.  Neurologic: Alert and oriented x 3.  Psych: Normal affect. Extremities: No clubbing or cyanosis.  HEENT: Normal.   ECG: Most recent ECG reviewed.  Labs:   Lab Results  Component Value Date   WBC 7.8 09/09/2007   HGB 13.0 09/09/2007   HCT 38.2* 09/09/2007   MCV 87.4 09/09/2007   PLT 159 09/09/2007   No results for input(s): NA, K, CL, CO2, BUN, CREATININE, CALCIUM, PROT, BILITOT, ALKPHOS, ALT, AST, GLUCOSE in the last 168 hours.  Invalid input(s): LABALBU Lab Results  Component Value Date   CKTOTAL 146 09/09/2007   CKMB 2.2 09/09/2007   TROPONINI 0.01        NO INDICATION OF MYOCARDIAL INJURY. 09/09/2007    Lab Results  Component Value Date   CHOL  09/09/2007    121        ATP III CLASSIFICATION:  <200     mg/dL   Desirable  161-096  mg/dL   Borderline High  >=045    mg/dL   High   Lab Results  Component Value Date   HDL 28* 09/09/2007  Lab Results  Component Value Date   [facility]  09/09/2007    75        Total Cholesterol/HDL:CHD Risk Coronary Heart Disease Risk Table                     Men   Women  1/2 Average Risk   3.4   3.3   Lab Results  Component Value Date   TRIG 92 09/09/2007   Lab Results  Component Value Date   CHOLHDL 4.3 09/09/2007   No results found for: LDLDIRECT       Studies: No results found.  ASSESSMENT AND PLAN:  1. CAD with h/o inferior wall MI and multivessel stenting: Symptomatically stable on aspirin , nitrates ,  metoprolol, ramipril, and simvastatin. No medication changes. Will try to obtain most recent cardiac catheterization report as well as echocardiogram from [place].  2. Essential HTN: Controlled. No changes.  3. Hyperlipidemia: Most recent lipids reviewed above. They are well controlled. Continue statin therapy.  Dispo: f/u 6 months.   Signed: Prentice Docker, M.D., F.A.C.C.  06/06/2015, 1:57 PM

## 2015-06-21 DIAGNOSIS — R410 Disorientation, unspecified: Secondary | ICD-10-CM | POA: Diagnosis not present

## 2015-06-21 DIAGNOSIS — Z7984 Long term (current) use of oral hypoglycemic drugs: Secondary | ICD-10-CM | POA: Diagnosis not present

## 2015-06-21 DIAGNOSIS — I517 Cardiomegaly: Secondary | ICD-10-CM | POA: Diagnosis not present

## 2015-06-21 DIAGNOSIS — E1165 Type 2 diabetes mellitus with hyperglycemia: Secondary | ICD-10-CM | POA: Diagnosis not present

## 2015-06-21 DIAGNOSIS — K219 Gastro-esophageal reflux disease without esophagitis: Secondary | ICD-10-CM | POA: Diagnosis present

## 2015-06-21 DIAGNOSIS — I082 Rheumatic disorders of both aortic and tricuspid valves: Secondary | ICD-10-CM | POA: Diagnosis not present

## 2015-06-21 DIAGNOSIS — G4730 Sleep apnea, unspecified: Secondary | ICD-10-CM | POA: Diagnosis present

## 2015-06-21 DIAGNOSIS — Z885 Allergy status to narcotic agent status: Secondary | ICD-10-CM | POA: Diagnosis not present

## 2015-06-21 DIAGNOSIS — J189 Pneumonia, unspecified organism: Secondary | ICD-10-CM | POA: Diagnosis not present

## 2015-06-21 DIAGNOSIS — R0602 Shortness of breath: Secondary | ICD-10-CM | POA: Diagnosis not present

## 2015-06-21 DIAGNOSIS — J440 Chronic obstructive pulmonary disease with acute lower respiratory infection: Secondary | ICD-10-CM | POA: Diagnosis present

## 2015-06-21 DIAGNOSIS — I5023 Acute on chronic systolic (congestive) heart failure: Secondary | ICD-10-CM | POA: Diagnosis not present

## 2015-06-21 DIAGNOSIS — Z6841 Body Mass Index (BMI) 40.0 and over, adult: Secondary | ICD-10-CM | POA: Diagnosis not present

## 2015-06-21 DIAGNOSIS — R300 Dysuria: Secondary | ICD-10-CM | POA: Diagnosis not present

## 2015-06-21 DIAGNOSIS — I252 Old myocardial infarction: Secondary | ICD-10-CM | POA: Diagnosis not present

## 2015-06-21 DIAGNOSIS — F410 Panic disorder [episodic paroxysmal anxiety] without agoraphobia: Secondary | ICD-10-CM | POA: Diagnosis present

## 2015-06-21 DIAGNOSIS — R509 Fever, unspecified: Secondary | ICD-10-CM | POA: Diagnosis not present

## 2015-06-21 DIAGNOSIS — Z87891 Personal history of nicotine dependence: Secondary | ICD-10-CM | POA: Diagnosis not present

## 2015-06-21 DIAGNOSIS — R0902 Hypoxemia: Secondary | ICD-10-CM | POA: Diagnosis not present

## 2015-06-21 DIAGNOSIS — F329 Major depressive disorder, single episode, unspecified: Secondary | ICD-10-CM | POA: Diagnosis present

## 2015-06-21 DIAGNOSIS — E1122 Type 2 diabetes mellitus with diabetic chronic kidney disease: Secondary | ICD-10-CM | POA: Diagnosis present

## 2015-06-21 DIAGNOSIS — I2510 Atherosclerotic heart disease of native coronary artery without angina pectoris: Secondary | ICD-10-CM | POA: Diagnosis present

## 2015-06-21 DIAGNOSIS — N182 Chronic kidney disease, stage 2 (mild): Secondary | ICD-10-CM | POA: Diagnosis present

## 2015-06-21 DIAGNOSIS — E860 Dehydration: Secondary | ICD-10-CM | POA: Diagnosis not present

## 2015-06-21 DIAGNOSIS — Z79899 Other long term (current) drug therapy: Secondary | ICD-10-CM | POA: Diagnosis not present

## 2015-06-21 DIAGNOSIS — Z881 Allergy status to other antibiotic agents status: Secondary | ICD-10-CM | POA: Diagnosis not present

## 2015-06-21 DIAGNOSIS — J069 Acute upper respiratory infection, unspecified: Secondary | ICD-10-CM | POA: Diagnosis not present

## 2015-06-21 DIAGNOSIS — Z7982 Long term (current) use of aspirin: Secondary | ICD-10-CM | POA: Diagnosis not present

## 2015-07-26 DIAGNOSIS — E1165 Type 2 diabetes mellitus with hyperglycemia: Secondary | ICD-10-CM | POA: Diagnosis not present

## 2015-07-26 DIAGNOSIS — I5023 Acute on chronic systolic (congestive) heart failure: Secondary | ICD-10-CM | POA: Diagnosis not present

## 2015-07-26 DIAGNOSIS — E860 Dehydration: Secondary | ICD-10-CM | POA: Diagnosis not present

## 2015-07-26 DIAGNOSIS — J159 Unspecified bacterial pneumonia: Secondary | ICD-10-CM | POA: Diagnosis not present

## 2015-08-05 DIAGNOSIS — J189 Pneumonia, unspecified organism: Secondary | ICD-10-CM | POA: Diagnosis not present

## 2015-09-01 DIAGNOSIS — J189 Pneumonia, unspecified organism: Secondary | ICD-10-CM | POA: Diagnosis not present

## 2015-09-29 DIAGNOSIS — E7800 Pure hypercholesterolemia, unspecified: Secondary | ICD-10-CM | POA: Diagnosis not present

## 2015-09-29 DIAGNOSIS — R0902 Hypoxemia: Secondary | ICD-10-CM | POA: Diagnosis not present

## 2015-09-29 DIAGNOSIS — I10 Essential (primary) hypertension: Secondary | ICD-10-CM | POA: Diagnosis not present

## 2015-09-29 DIAGNOSIS — E1143 Type 2 diabetes mellitus with diabetic autonomic (poly)neuropathy: Secondary | ICD-10-CM | POA: Diagnosis not present

## 2015-10-02 DIAGNOSIS — N183 Chronic kidney disease, stage 3 (moderate): Secondary | ICD-10-CM | POA: Diagnosis not present

## 2015-10-02 DIAGNOSIS — I10 Essential (primary) hypertension: Secondary | ICD-10-CM | POA: Diagnosis not present

## 2015-10-02 DIAGNOSIS — F5101 Primary insomnia: Secondary | ICD-10-CM | POA: Diagnosis not present

## 2015-10-02 DIAGNOSIS — K210 Gastro-esophageal reflux disease with esophagitis: Secondary | ICD-10-CM | POA: Diagnosis not present

## 2015-10-02 DIAGNOSIS — E1165 Type 2 diabetes mellitus with hyperglycemia: Secondary | ICD-10-CM | POA: Diagnosis not present

## 2015-10-02 DIAGNOSIS — I259 Chronic ischemic heart disease, unspecified: Secondary | ICD-10-CM | POA: Diagnosis not present

## 2015-10-02 DIAGNOSIS — G4733 Obstructive sleep apnea (adult) (pediatric): Secondary | ICD-10-CM | POA: Diagnosis not present

## 2015-11-19 DIAGNOSIS — E1140 Type 2 diabetes mellitus with diabetic neuropathy, unspecified: Secondary | ICD-10-CM | POA: Diagnosis not present

## 2015-11-19 DIAGNOSIS — L110 Acquired keratosis follicularis: Secondary | ICD-10-CM | POA: Diagnosis not present

## 2015-11-19 DIAGNOSIS — B351 Tinea unguium: Secondary | ICD-10-CM | POA: Diagnosis not present

## 2016-01-06 DIAGNOSIS — M545 Low back pain: Secondary | ICD-10-CM | POA: Diagnosis not present

## 2016-01-06 DIAGNOSIS — M62830 Muscle spasm of back: Secondary | ICD-10-CM | POA: Diagnosis not present

## 2016-01-06 DIAGNOSIS — Z6841 Body Mass Index (BMI) 40.0 and over, adult: Secondary | ICD-10-CM | POA: Diagnosis not present

## 2016-01-12 DIAGNOSIS — M545 Low back pain: Secondary | ICD-10-CM | POA: Diagnosis not present

## 2016-01-14 DIAGNOSIS — M545 Low back pain: Secondary | ICD-10-CM | POA: Diagnosis not present

## 2016-01-19 DIAGNOSIS — M545 Low back pain: Secondary | ICD-10-CM | POA: Diagnosis not present

## 2016-01-22 DIAGNOSIS — M545 Low back pain: Secondary | ICD-10-CM | POA: Diagnosis not present

## 2016-01-23 ENCOUNTER — Ambulatory Visit: Payer: Medicare Other | Admitting: Cardiovascular Disease

## 2016-01-27 DIAGNOSIS — E1165 Type 2 diabetes mellitus with hyperglycemia: Secondary | ICD-10-CM | POA: Diagnosis not present

## 2016-01-27 DIAGNOSIS — E7800 Pure hypercholesterolemia, unspecified: Secondary | ICD-10-CM | POA: Diagnosis not present

## 2016-01-27 DIAGNOSIS — I10 Essential (primary) hypertension: Secondary | ICD-10-CM | POA: Diagnosis not present

## 2016-01-29 DIAGNOSIS — E1165 Type 2 diabetes mellitus with hyperglycemia: Secondary | ICD-10-CM | POA: Diagnosis not present

## 2016-01-29 DIAGNOSIS — Z23 Encounter for immunization: Secondary | ICD-10-CM | POA: Diagnosis not present

## 2016-01-29 DIAGNOSIS — M47816 Spondylosis without myelopathy or radiculopathy, lumbar region: Secondary | ICD-10-CM | POA: Diagnosis not present

## 2016-01-29 DIAGNOSIS — M545 Low back pain: Secondary | ICD-10-CM | POA: Diagnosis not present

## 2016-01-29 DIAGNOSIS — N183 Chronic kidney disease, stage 3 (moderate): Secondary | ICD-10-CM | POA: Diagnosis not present

## 2016-01-29 DIAGNOSIS — Z6841 Body Mass Index (BMI) 40.0 and over, adult: Secondary | ICD-10-CM | POA: Diagnosis not present

## 2016-01-29 DIAGNOSIS — K210 Gastro-esophageal reflux disease with esophagitis: Secondary | ICD-10-CM | POA: Diagnosis not present

## 2016-01-29 DIAGNOSIS — I10 Essential (primary) hypertension: Secondary | ICD-10-CM | POA: Diagnosis not present

## 2016-01-29 DIAGNOSIS — I259 Chronic ischemic heart disease, unspecified: Secondary | ICD-10-CM | POA: Diagnosis not present

## 2016-01-29 DIAGNOSIS — I708 Atherosclerosis of other arteries: Secondary | ICD-10-CM | POA: Diagnosis not present

## 2016-02-03 DIAGNOSIS — M545 Low back pain: Secondary | ICD-10-CM | POA: Diagnosis not present

## 2016-02-05 DIAGNOSIS — M545 Low back pain: Secondary | ICD-10-CM | POA: Diagnosis not present

## 2016-02-09 DIAGNOSIS — M545 Low back pain: Secondary | ICD-10-CM | POA: Diagnosis not present

## 2016-02-11 DIAGNOSIS — B351 Tinea unguium: Secondary | ICD-10-CM | POA: Diagnosis not present

## 2016-02-11 DIAGNOSIS — E1140 Type 2 diabetes mellitus with diabetic neuropathy, unspecified: Secondary | ICD-10-CM | POA: Diagnosis not present

## 2016-02-11 DIAGNOSIS — L110 Acquired keratosis follicularis: Secondary | ICD-10-CM | POA: Diagnosis not present

## 2016-02-12 DIAGNOSIS — M545 Low back pain: Secondary | ICD-10-CM | POA: Diagnosis not present

## 2016-02-17 DIAGNOSIS — M545 Low back pain: Secondary | ICD-10-CM | POA: Diagnosis not present

## 2016-02-19 DIAGNOSIS — M545 Low back pain: Secondary | ICD-10-CM | POA: Diagnosis not present

## 2016-03-03 DIAGNOSIS — Z6841 Body Mass Index (BMI) 40.0 and over, adult: Secondary | ICD-10-CM | POA: Diagnosis not present

## 2016-03-03 DIAGNOSIS — J189 Pneumonia, unspecified organism: Secondary | ICD-10-CM | POA: Diagnosis not present

## 2016-04-28 DIAGNOSIS — L110 Acquired keratosis follicularis: Secondary | ICD-10-CM | POA: Diagnosis not present

## 2016-04-28 DIAGNOSIS — E1140 Type 2 diabetes mellitus with diabetic neuropathy, unspecified: Secondary | ICD-10-CM | POA: Diagnosis not present

## 2016-04-28 DIAGNOSIS — B351 Tinea unguium: Secondary | ICD-10-CM | POA: Diagnosis not present

## 2016-05-24 DIAGNOSIS — E7800 Pure hypercholesterolemia, unspecified: Secondary | ICD-10-CM | POA: Diagnosis not present

## 2016-05-24 DIAGNOSIS — K210 Gastro-esophageal reflux disease with esophagitis: Secondary | ICD-10-CM | POA: Diagnosis not present

## 2016-05-24 DIAGNOSIS — I10 Essential (primary) hypertension: Secondary | ICD-10-CM | POA: Diagnosis not present

## 2016-05-24 DIAGNOSIS — I259 Chronic ischemic heart disease, unspecified: Secondary | ICD-10-CM | POA: Diagnosis not present

## 2016-05-24 DIAGNOSIS — G4733 Obstructive sleep apnea (adult) (pediatric): Secondary | ICD-10-CM | POA: Diagnosis not present

## 2016-05-24 DIAGNOSIS — E1165 Type 2 diabetes mellitus with hyperglycemia: Secondary | ICD-10-CM | POA: Diagnosis not present

## 2016-05-24 DIAGNOSIS — G629 Polyneuropathy, unspecified: Secondary | ICD-10-CM | POA: Diagnosis not present

## 2016-05-26 DIAGNOSIS — I259 Chronic ischemic heart disease, unspecified: Secondary | ICD-10-CM | POA: Diagnosis not present

## 2016-05-26 DIAGNOSIS — I10 Essential (primary) hypertension: Secondary | ICD-10-CM | POA: Diagnosis not present

## 2016-05-26 DIAGNOSIS — Z6841 Body Mass Index (BMI) 40.0 and over, adult: Secondary | ICD-10-CM | POA: Diagnosis not present

## 2016-05-26 DIAGNOSIS — F5101 Primary insomnia: Secondary | ICD-10-CM | POA: Diagnosis not present

## 2016-05-26 DIAGNOSIS — N183 Chronic kidney disease, stage 3 (moderate): Secondary | ICD-10-CM | POA: Diagnosis not present

## 2016-05-26 DIAGNOSIS — E1165 Type 2 diabetes mellitus with hyperglycemia: Secondary | ICD-10-CM | POA: Diagnosis not present

## 2016-05-26 DIAGNOSIS — K210 Gastro-esophageal reflux disease with esophagitis: Secondary | ICD-10-CM | POA: Diagnosis not present

## 2016-07-14 DIAGNOSIS — E1140 Type 2 diabetes mellitus with diabetic neuropathy, unspecified: Secondary | ICD-10-CM | POA: Diagnosis not present

## 2016-07-14 DIAGNOSIS — L110 Acquired keratosis follicularis: Secondary | ICD-10-CM | POA: Diagnosis not present

## 2016-07-14 DIAGNOSIS — B351 Tinea unguium: Secondary | ICD-10-CM | POA: Diagnosis not present

## 2016-08-06 DIAGNOSIS — M79674 Pain in right toe(s): Secondary | ICD-10-CM | POA: Diagnosis not present

## 2016-08-06 DIAGNOSIS — E1165 Type 2 diabetes mellitus with hyperglycemia: Secondary | ICD-10-CM | POA: Diagnosis not present

## 2016-08-06 DIAGNOSIS — L03031 Cellulitis of right toe: Secondary | ICD-10-CM | POA: Diagnosis not present

## 2016-08-06 DIAGNOSIS — Z6839 Body mass index (BMI) 39.0-39.9, adult: Secondary | ICD-10-CM | POA: Diagnosis not present

## 2016-08-12 DIAGNOSIS — Z1389 Encounter for screening for other disorder: Secondary | ICD-10-CM | POA: Diagnosis not present

## 2016-08-12 DIAGNOSIS — E1165 Type 2 diabetes mellitus with hyperglycemia: Secondary | ICD-10-CM | POA: Diagnosis not present

## 2016-08-12 DIAGNOSIS — Z6841 Body Mass Index (BMI) 40.0 and over, adult: Secondary | ICD-10-CM | POA: Diagnosis not present

## 2016-08-12 DIAGNOSIS — E119 Type 2 diabetes mellitus without complications: Secondary | ICD-10-CM | POA: Diagnosis not present

## 2016-08-12 DIAGNOSIS — L03031 Cellulitis of right toe: Secondary | ICD-10-CM | POA: Diagnosis not present

## 2016-08-12 DIAGNOSIS — M79674 Pain in right toe(s): Secondary | ICD-10-CM | POA: Diagnosis not present

## 2016-08-12 DIAGNOSIS — L97529 Non-pressure chronic ulcer of other part of left foot with unspecified severity: Secondary | ICD-10-CM | POA: Diagnosis not present

## 2016-08-17 DIAGNOSIS — E7800 Pure hypercholesterolemia, unspecified: Secondary | ICD-10-CM | POA: Diagnosis not present

## 2016-08-17 DIAGNOSIS — E11621 Type 2 diabetes mellitus with foot ulcer: Secondary | ICD-10-CM | POA: Diagnosis not present

## 2016-08-17 DIAGNOSIS — F418 Other specified anxiety disorders: Secondary | ICD-10-CM | POA: Diagnosis not present

## 2016-08-17 DIAGNOSIS — L97518 Non-pressure chronic ulcer of other part of right foot with other specified severity: Secondary | ICD-10-CM | POA: Diagnosis not present

## 2016-08-17 DIAGNOSIS — Z881 Allergy status to other antibiotic agents status: Secondary | ICD-10-CM | POA: Diagnosis not present

## 2016-08-17 DIAGNOSIS — L02611 Cutaneous abscess of right foot: Secondary | ICD-10-CM | POA: Diagnosis not present

## 2016-08-17 DIAGNOSIS — Z885 Allergy status to narcotic agent status: Secondary | ICD-10-CM | POA: Diagnosis not present

## 2016-08-17 DIAGNOSIS — N400 Enlarged prostate without lower urinary tract symptoms: Secondary | ICD-10-CM | POA: Diagnosis not present

## 2016-08-17 DIAGNOSIS — Z79899 Other long term (current) drug therapy: Secondary | ICD-10-CM | POA: Diagnosis not present

## 2016-08-17 DIAGNOSIS — I10 Essential (primary) hypertension: Secondary | ICD-10-CM | POA: Diagnosis not present

## 2016-08-17 DIAGNOSIS — L97512 Non-pressure chronic ulcer of other part of right foot with fat layer exposed: Secondary | ICD-10-CM | POA: Diagnosis not present

## 2016-08-17 DIAGNOSIS — Z7984 Long term (current) use of oral hypoglycemic drugs: Secondary | ICD-10-CM | POA: Diagnosis not present

## 2016-08-17 DIAGNOSIS — K219 Gastro-esophageal reflux disease without esophagitis: Secondary | ICD-10-CM | POA: Diagnosis not present

## 2016-08-23 DIAGNOSIS — L97909 Non-pressure chronic ulcer of unspecified part of unspecified lower leg with unspecified severity: Secondary | ICD-10-CM | POA: Diagnosis not present

## 2016-08-23 DIAGNOSIS — L97519 Non-pressure chronic ulcer of other part of right foot with unspecified severity: Secondary | ICD-10-CM | POA: Diagnosis not present

## 2016-08-23 DIAGNOSIS — E11621 Type 2 diabetes mellitus with foot ulcer: Secondary | ICD-10-CM | POA: Diagnosis not present

## 2016-08-24 DIAGNOSIS — N400 Enlarged prostate without lower urinary tract symptoms: Secondary | ICD-10-CM | POA: Diagnosis not present

## 2016-08-24 DIAGNOSIS — Z885 Allergy status to narcotic agent status: Secondary | ICD-10-CM | POA: Diagnosis not present

## 2016-08-24 DIAGNOSIS — L97518 Non-pressure chronic ulcer of other part of right foot with other specified severity: Secondary | ICD-10-CM | POA: Diagnosis not present

## 2016-08-24 DIAGNOSIS — L02611 Cutaneous abscess of right foot: Secondary | ICD-10-CM | POA: Diagnosis not present

## 2016-08-24 DIAGNOSIS — E7800 Pure hypercholesterolemia, unspecified: Secondary | ICD-10-CM | POA: Diagnosis not present

## 2016-08-24 DIAGNOSIS — Z881 Allergy status to other antibiotic agents status: Secondary | ICD-10-CM | POA: Diagnosis not present

## 2016-08-24 DIAGNOSIS — Z79899 Other long term (current) drug therapy: Secondary | ICD-10-CM | POA: Diagnosis not present

## 2016-08-24 DIAGNOSIS — Z7984 Long term (current) use of oral hypoglycemic drugs: Secondary | ICD-10-CM | POA: Diagnosis not present

## 2016-08-24 DIAGNOSIS — L97512 Non-pressure chronic ulcer of other part of right foot with fat layer exposed: Secondary | ICD-10-CM | POA: Diagnosis not present

## 2016-08-24 DIAGNOSIS — I10 Essential (primary) hypertension: Secondary | ICD-10-CM | POA: Diagnosis not present

## 2016-08-24 DIAGNOSIS — K219 Gastro-esophageal reflux disease without esophagitis: Secondary | ICD-10-CM | POA: Diagnosis not present

## 2016-08-24 DIAGNOSIS — F418 Other specified anxiety disorders: Secondary | ICD-10-CM | POA: Diagnosis not present

## 2016-08-24 DIAGNOSIS — E11621 Type 2 diabetes mellitus with foot ulcer: Secondary | ICD-10-CM | POA: Diagnosis not present

## 2016-09-07 DIAGNOSIS — E7800 Pure hypercholesterolemia, unspecified: Secondary | ICD-10-CM | POA: Diagnosis not present

## 2016-09-07 DIAGNOSIS — L02611 Cutaneous abscess of right foot: Secondary | ICD-10-CM | POA: Diagnosis not present

## 2016-09-07 DIAGNOSIS — L97518 Non-pressure chronic ulcer of other part of right foot with other specified severity: Secondary | ICD-10-CM | POA: Diagnosis not present

## 2016-09-07 DIAGNOSIS — L02818 Cutaneous abscess of other sites: Secondary | ICD-10-CM | POA: Diagnosis not present

## 2016-09-07 DIAGNOSIS — K219 Gastro-esophageal reflux disease without esophagitis: Secondary | ICD-10-CM | POA: Diagnosis not present

## 2016-09-07 DIAGNOSIS — E11621 Type 2 diabetes mellitus with foot ulcer: Secondary | ICD-10-CM | POA: Diagnosis not present

## 2016-09-07 DIAGNOSIS — I10 Essential (primary) hypertension: Secondary | ICD-10-CM | POA: Diagnosis not present

## 2016-09-14 DIAGNOSIS — E11621 Type 2 diabetes mellitus with foot ulcer: Secondary | ICD-10-CM | POA: Diagnosis not present

## 2016-09-14 DIAGNOSIS — K219 Gastro-esophageal reflux disease without esophagitis: Secondary | ICD-10-CM | POA: Diagnosis not present

## 2016-09-14 DIAGNOSIS — E7800 Pure hypercholesterolemia, unspecified: Secondary | ICD-10-CM | POA: Diagnosis not present

## 2016-09-14 DIAGNOSIS — I10 Essential (primary) hypertension: Secondary | ICD-10-CM | POA: Diagnosis not present

## 2016-09-14 DIAGNOSIS — L02611 Cutaneous abscess of right foot: Secondary | ICD-10-CM | POA: Diagnosis not present

## 2016-09-14 DIAGNOSIS — L97518 Non-pressure chronic ulcer of other part of right foot with other specified severity: Secondary | ICD-10-CM | POA: Diagnosis not present

## 2016-09-16 DIAGNOSIS — Z6841 Body Mass Index (BMI) 40.0 and over, adult: Secondary | ICD-10-CM | POA: Diagnosis not present

## 2016-09-16 DIAGNOSIS — E11319 Type 2 diabetes mellitus with unspecified diabetic retinopathy without macular edema: Secondary | ICD-10-CM | POA: Diagnosis not present

## 2016-09-16 DIAGNOSIS — E1165 Type 2 diabetes mellitus with hyperglycemia: Secondary | ICD-10-CM | POA: Diagnosis not present

## 2016-09-22 DIAGNOSIS — Z8631 Personal history of diabetic foot ulcer: Secondary | ICD-10-CM | POA: Diagnosis not present

## 2016-09-22 DIAGNOSIS — L97512 Non-pressure chronic ulcer of other part of right foot with fat layer exposed: Secondary | ICD-10-CM | POA: Diagnosis not present

## 2016-09-22 DIAGNOSIS — E11621 Type 2 diabetes mellitus with foot ulcer: Secondary | ICD-10-CM | POA: Diagnosis not present

## 2016-09-22 DIAGNOSIS — Z09 Encounter for follow-up examination after completed treatment for conditions other than malignant neoplasm: Secondary | ICD-10-CM | POA: Diagnosis not present

## 2016-09-24 DIAGNOSIS — Z0001 Encounter for general adult medical examination with abnormal findings: Secondary | ICD-10-CM | POA: Diagnosis not present

## 2016-09-24 DIAGNOSIS — G4733 Obstructive sleep apnea (adult) (pediatric): Secondary | ICD-10-CM | POA: Diagnosis not present

## 2016-09-24 DIAGNOSIS — E1142 Type 2 diabetes mellitus with diabetic polyneuropathy: Secondary | ICD-10-CM | POA: Diagnosis not present

## 2016-09-24 DIAGNOSIS — K210 Gastro-esophageal reflux disease with esophagitis: Secondary | ICD-10-CM | POA: Diagnosis not present

## 2016-09-24 DIAGNOSIS — Z6839 Body mass index (BMI) 39.0-39.9, adult: Secondary | ICD-10-CM | POA: Diagnosis not present

## 2016-09-24 DIAGNOSIS — E1165 Type 2 diabetes mellitus with hyperglycemia: Secondary | ICD-10-CM | POA: Diagnosis not present

## 2016-09-24 DIAGNOSIS — E1143 Type 2 diabetes mellitus with diabetic autonomic (poly)neuropathy: Secondary | ICD-10-CM | POA: Diagnosis not present

## 2016-09-24 DIAGNOSIS — E7800 Pure hypercholesterolemia, unspecified: Secondary | ICD-10-CM | POA: Diagnosis not present

## 2016-09-24 DIAGNOSIS — G4723 Circadian rhythm sleep disorder, irregular sleep wake type: Secondary | ICD-10-CM | POA: Diagnosis not present

## 2016-09-24 DIAGNOSIS — N183 Chronic kidney disease, stage 3 (moderate): Secondary | ICD-10-CM | POA: Diagnosis not present

## 2016-09-24 DIAGNOSIS — I10 Essential (primary) hypertension: Secondary | ICD-10-CM | POA: Diagnosis not present

## 2016-09-28 DIAGNOSIS — I10 Essential (primary) hypertension: Secondary | ICD-10-CM | POA: Diagnosis not present

## 2016-09-28 DIAGNOSIS — F5101 Primary insomnia: Secondary | ICD-10-CM | POA: Diagnosis not present

## 2016-09-28 DIAGNOSIS — I259 Chronic ischemic heart disease, unspecified: Secondary | ICD-10-CM | POA: Diagnosis not present

## 2016-09-28 DIAGNOSIS — E1165 Type 2 diabetes mellitus with hyperglycemia: Secondary | ICD-10-CM | POA: Diagnosis not present

## 2016-09-28 DIAGNOSIS — K210 Gastro-esophageal reflux disease with esophagitis: Secondary | ICD-10-CM | POA: Diagnosis not present

## 2016-09-28 DIAGNOSIS — Z6841 Body Mass Index (BMI) 40.0 and over, adult: Secondary | ICD-10-CM | POA: Diagnosis not present

## 2016-09-28 DIAGNOSIS — N183 Chronic kidney disease, stage 3 (moderate): Secondary | ICD-10-CM | POA: Diagnosis not present

## 2016-09-29 DIAGNOSIS — B351 Tinea unguium: Secondary | ICD-10-CM | POA: Diagnosis not present

## 2016-09-29 DIAGNOSIS — L110 Acquired keratosis follicularis: Secondary | ICD-10-CM | POA: Diagnosis not present

## 2016-09-29 DIAGNOSIS — E1140 Type 2 diabetes mellitus with diabetic neuropathy, unspecified: Secondary | ICD-10-CM | POA: Diagnosis not present

## 2016-10-08 DIAGNOSIS — Z6839 Body mass index (BMI) 39.0-39.9, adult: Secondary | ICD-10-CM | POA: Diagnosis not present

## 2016-10-08 DIAGNOSIS — E1165 Type 2 diabetes mellitus with hyperglycemia: Secondary | ICD-10-CM | POA: Diagnosis not present

## 2016-11-01 DIAGNOSIS — Z6839 Body mass index (BMI) 39.0-39.9, adult: Secondary | ICD-10-CM | POA: Diagnosis not present

## 2016-11-01 DIAGNOSIS — L03115 Cellulitis of right lower limb: Secondary | ICD-10-CM | POA: Diagnosis not present

## 2016-11-08 DIAGNOSIS — M6281 Muscle weakness (generalized): Secondary | ICD-10-CM | POA: Diagnosis not present

## 2016-11-08 DIAGNOSIS — R296 Repeated falls: Secondary | ICD-10-CM | POA: Diagnosis not present

## 2016-11-09 ENCOUNTER — Other Ambulatory Visit: Payer: Self-pay

## 2016-11-10 DIAGNOSIS — M6281 Muscle weakness (generalized): Secondary | ICD-10-CM | POA: Diagnosis not present

## 2016-11-10 DIAGNOSIS — R296 Repeated falls: Secondary | ICD-10-CM | POA: Diagnosis not present

## 2016-11-15 DIAGNOSIS — R296 Repeated falls: Secondary | ICD-10-CM | POA: Diagnosis not present

## 2016-11-15 DIAGNOSIS — M6281 Muscle weakness (generalized): Secondary | ICD-10-CM | POA: Diagnosis not present

## 2016-11-17 DIAGNOSIS — M6281 Muscle weakness (generalized): Secondary | ICD-10-CM | POA: Diagnosis not present

## 2016-11-17 DIAGNOSIS — R296 Repeated falls: Secondary | ICD-10-CM | POA: Diagnosis not present

## 2016-11-22 DIAGNOSIS — R296 Repeated falls: Secondary | ICD-10-CM | POA: Diagnosis not present

## 2016-11-22 DIAGNOSIS — M6281 Muscle weakness (generalized): Secondary | ICD-10-CM | POA: Diagnosis not present

## 2016-11-25 DIAGNOSIS — R296 Repeated falls: Secondary | ICD-10-CM | POA: Diagnosis not present

## 2016-11-25 DIAGNOSIS — M6281 Muscle weakness (generalized): Secondary | ICD-10-CM | POA: Diagnosis not present

## 2016-11-29 DIAGNOSIS — M6281 Muscle weakness (generalized): Secondary | ICD-10-CM | POA: Diagnosis not present

## 2016-11-29 DIAGNOSIS — R296 Repeated falls: Secondary | ICD-10-CM | POA: Diagnosis not present

## 2016-12-01 DIAGNOSIS — R296 Repeated falls: Secondary | ICD-10-CM | POA: Diagnosis not present

## 2016-12-01 DIAGNOSIS — M6281 Muscle weakness (generalized): Secondary | ICD-10-CM | POA: Diagnosis not present

## 2016-12-06 DIAGNOSIS — M6281 Muscle weakness (generalized): Secondary | ICD-10-CM | POA: Diagnosis not present

## 2016-12-06 DIAGNOSIS — R296 Repeated falls: Secondary | ICD-10-CM | POA: Diagnosis not present

## 2016-12-13 DIAGNOSIS — M6281 Muscle weakness (generalized): Secondary | ICD-10-CM | POA: Diagnosis not present

## 2016-12-13 DIAGNOSIS — R296 Repeated falls: Secondary | ICD-10-CM | POA: Diagnosis not present

## 2017-02-02 DIAGNOSIS — E1165 Type 2 diabetes mellitus with hyperglycemia: Secondary | ICD-10-CM | POA: Diagnosis not present

## 2017-02-02 DIAGNOSIS — E7800 Pure hypercholesterolemia, unspecified: Secondary | ICD-10-CM | POA: Diagnosis not present

## 2017-02-02 DIAGNOSIS — E1142 Type 2 diabetes mellitus with diabetic polyneuropathy: Secondary | ICD-10-CM | POA: Diagnosis not present

## 2017-02-02 DIAGNOSIS — I10 Essential (primary) hypertension: Secondary | ICD-10-CM | POA: Diagnosis not present

## 2017-02-02 DIAGNOSIS — K210 Gastro-esophageal reflux disease with esophagitis: Secondary | ICD-10-CM | POA: Diagnosis not present

## 2017-02-02 DIAGNOSIS — G4733 Obstructive sleep apnea (adult) (pediatric): Secondary | ICD-10-CM | POA: Diagnosis not present

## 2017-02-02 DIAGNOSIS — N183 Chronic kidney disease, stage 3 (moderate): Secondary | ICD-10-CM | POA: Diagnosis not present

## 2017-02-08 DIAGNOSIS — Z23 Encounter for immunization: Secondary | ICD-10-CM | POA: Diagnosis not present

## 2017-02-08 DIAGNOSIS — F5101 Primary insomnia: Secondary | ICD-10-CM | POA: Diagnosis not present

## 2017-02-08 DIAGNOSIS — I10 Essential (primary) hypertension: Secondary | ICD-10-CM | POA: Diagnosis not present

## 2017-02-08 DIAGNOSIS — E1142 Type 2 diabetes mellitus with diabetic polyneuropathy: Secondary | ICD-10-CM | POA: Diagnosis not present

## 2017-02-08 DIAGNOSIS — E1165 Type 2 diabetes mellitus with hyperglycemia: Secondary | ICD-10-CM | POA: Diagnosis not present

## 2017-02-08 DIAGNOSIS — G629 Polyneuropathy, unspecified: Secondary | ICD-10-CM | POA: Diagnosis not present

## 2017-02-08 DIAGNOSIS — I259 Chronic ischemic heart disease, unspecified: Secondary | ICD-10-CM | POA: Diagnosis not present

## 2017-02-08 DIAGNOSIS — N183 Chronic kidney disease, stage 3 (moderate): Secondary | ICD-10-CM | POA: Diagnosis not present

## 2017-03-14 DIAGNOSIS — R51 Headache: Secondary | ICD-10-CM | POA: Diagnosis not present

## 2017-03-14 DIAGNOSIS — S0083XA Contusion of other part of head, initial encounter: Secondary | ICD-10-CM | POA: Diagnosis not present

## 2017-03-14 DIAGNOSIS — W101XXA Fall (on)(from) sidewalk curb, initial encounter: Secondary | ICD-10-CM | POA: Diagnosis not present

## 2017-03-14 DIAGNOSIS — S40011A Contusion of right shoulder, initial encounter: Secondary | ICD-10-CM | POA: Diagnosis not present

## 2017-03-14 DIAGNOSIS — M25511 Pain in right shoulder: Secondary | ICD-10-CM | POA: Diagnosis not present

## 2017-03-14 DIAGNOSIS — Z6839 Body mass index (BMI) 39.0-39.9, adult: Secondary | ICD-10-CM | POA: Diagnosis not present

## 2017-04-29 DIAGNOSIS — I509 Heart failure, unspecified: Secondary | ICD-10-CM | POA: Diagnosis not present

## 2017-04-29 DIAGNOSIS — R0602 Shortness of breath: Secondary | ICD-10-CM | POA: Diagnosis not present

## 2017-04-29 DIAGNOSIS — K219 Gastro-esophageal reflux disease without esophagitis: Secondary | ICD-10-CM | POA: Diagnosis not present

## 2017-04-29 DIAGNOSIS — Z79899 Other long term (current) drug therapy: Secondary | ICD-10-CM | POA: Diagnosis not present

## 2017-04-29 DIAGNOSIS — Z7982 Long term (current) use of aspirin: Secondary | ICD-10-CM | POA: Diagnosis not present

## 2017-04-29 DIAGNOSIS — I447 Left bundle-branch block, unspecified: Secondary | ICD-10-CM | POA: Diagnosis not present

## 2017-04-29 DIAGNOSIS — J209 Acute bronchitis, unspecified: Secondary | ICD-10-CM | POA: Diagnosis not present

## 2017-04-29 DIAGNOSIS — J40 Bronchitis, not specified as acute or chronic: Secondary | ICD-10-CM | POA: Diagnosis not present

## 2017-04-29 DIAGNOSIS — R42 Dizziness and giddiness: Secondary | ICD-10-CM | POA: Diagnosis not present

## 2017-04-29 DIAGNOSIS — E11649 Type 2 diabetes mellitus with hypoglycemia without coma: Secondary | ICD-10-CM | POA: Diagnosis not present

## 2017-04-29 DIAGNOSIS — R404 Transient alteration of awareness: Secondary | ICD-10-CM | POA: Diagnosis not present

## 2017-04-29 DIAGNOSIS — Z794 Long term (current) use of insulin: Secondary | ICD-10-CM | POA: Diagnosis not present

## 2017-04-29 DIAGNOSIS — G4730 Sleep apnea, unspecified: Secondary | ICD-10-CM | POA: Diagnosis not present

## 2017-04-29 DIAGNOSIS — I252 Old myocardial infarction: Secondary | ICD-10-CM | POA: Diagnosis not present

## 2017-04-29 DIAGNOSIS — R05 Cough: Secondary | ICD-10-CM | POA: Diagnosis not present

## 2017-05-04 DIAGNOSIS — Z6841 Body Mass Index (BMI) 40.0 and over, adult: Secondary | ICD-10-CM | POA: Diagnosis not present

## 2017-05-04 DIAGNOSIS — E11649 Type 2 diabetes mellitus with hypoglycemia without coma: Secondary | ICD-10-CM | POA: Diagnosis not present

## 2017-05-04 DIAGNOSIS — J441 Chronic obstructive pulmonary disease with (acute) exacerbation: Secondary | ICD-10-CM | POA: Diagnosis not present

## 2017-05-12 DIAGNOSIS — B351 Tinea unguium: Secondary | ICD-10-CM | POA: Diagnosis not present

## 2017-05-12 DIAGNOSIS — E1140 Type 2 diabetes mellitus with diabetic neuropathy, unspecified: Secondary | ICD-10-CM | POA: Diagnosis not present

## 2017-05-12 DIAGNOSIS — L110 Acquired keratosis follicularis: Secondary | ICD-10-CM | POA: Diagnosis not present

## 2017-06-07 DIAGNOSIS — I259 Chronic ischemic heart disease, unspecified: Secondary | ICD-10-CM | POA: Diagnosis not present

## 2017-06-07 DIAGNOSIS — R0902 Hypoxemia: Secondary | ICD-10-CM | POA: Diagnosis not present

## 2017-06-07 DIAGNOSIS — E1165 Type 2 diabetes mellitus with hyperglycemia: Secondary | ICD-10-CM | POA: Diagnosis not present

## 2017-06-07 DIAGNOSIS — E1142 Type 2 diabetes mellitus with diabetic polyneuropathy: Secondary | ICD-10-CM | POA: Diagnosis not present

## 2017-06-07 DIAGNOSIS — E11649 Type 2 diabetes mellitus with hypoglycemia without coma: Secondary | ICD-10-CM | POA: Diagnosis not present

## 2017-06-07 DIAGNOSIS — K210 Gastro-esophageal reflux disease with esophagitis: Secondary | ICD-10-CM | POA: Diagnosis not present

## 2017-06-07 DIAGNOSIS — N183 Chronic kidney disease, stage 3 (moderate): Secondary | ICD-10-CM | POA: Diagnosis not present

## 2017-06-07 DIAGNOSIS — E7800 Pure hypercholesterolemia, unspecified: Secondary | ICD-10-CM | POA: Diagnosis not present

## 2017-06-07 DIAGNOSIS — I10 Essential (primary) hypertension: Secondary | ICD-10-CM | POA: Diagnosis not present

## 2017-06-07 DIAGNOSIS — E1143 Type 2 diabetes mellitus with diabetic autonomic (poly)neuropathy: Secondary | ICD-10-CM | POA: Diagnosis not present

## 2017-06-07 DIAGNOSIS — G629 Polyneuropathy, unspecified: Secondary | ICD-10-CM | POA: Diagnosis not present

## 2017-06-08 DIAGNOSIS — K210 Gastro-esophageal reflux disease with esophagitis: Secondary | ICD-10-CM | POA: Diagnosis not present

## 2017-06-08 DIAGNOSIS — E1165 Type 2 diabetes mellitus with hyperglycemia: Secondary | ICD-10-CM | POA: Diagnosis not present

## 2017-06-08 DIAGNOSIS — G4733 Obstructive sleep apnea (adult) (pediatric): Secondary | ICD-10-CM | POA: Diagnosis not present

## 2017-06-08 DIAGNOSIS — N183 Chronic kidney disease, stage 3 (moderate): Secondary | ICD-10-CM | POA: Diagnosis not present

## 2017-06-08 DIAGNOSIS — F5101 Primary insomnia: Secondary | ICD-10-CM | POA: Diagnosis not present

## 2017-06-08 DIAGNOSIS — I10 Essential (primary) hypertension: Secondary | ICD-10-CM | POA: Diagnosis not present

## 2017-06-08 DIAGNOSIS — I259 Chronic ischemic heart disease, unspecified: Secondary | ICD-10-CM | POA: Diagnosis not present

## 2017-08-04 DIAGNOSIS — E1140 Type 2 diabetes mellitus with diabetic neuropathy, unspecified: Secondary | ICD-10-CM | POA: Diagnosis not present

## 2017-08-04 DIAGNOSIS — B351 Tinea unguium: Secondary | ICD-10-CM | POA: Diagnosis not present

## 2017-08-04 DIAGNOSIS — L110 Acquired keratosis follicularis: Secondary | ICD-10-CM | POA: Diagnosis not present

## 2017-09-16 DIAGNOSIS — Z6839 Body mass index (BMI) 39.0-39.9, adult: Secondary | ICD-10-CM | POA: Diagnosis not present

## 2017-09-16 DIAGNOSIS — G629 Polyneuropathy, unspecified: Secondary | ICD-10-CM | POA: Diagnosis not present

## 2017-09-16 DIAGNOSIS — J301 Allergic rhinitis due to pollen: Secondary | ICD-10-CM | POA: Diagnosis not present

## 2017-09-28 DIAGNOSIS — I10 Essential (primary) hypertension: Secondary | ICD-10-CM | POA: Diagnosis not present

## 2017-09-28 DIAGNOSIS — E7800 Pure hypercholesterolemia, unspecified: Secondary | ICD-10-CM | POA: Diagnosis not present

## 2017-09-28 DIAGNOSIS — E1165 Type 2 diabetes mellitus with hyperglycemia: Secondary | ICD-10-CM | POA: Diagnosis not present

## 2017-09-28 DIAGNOSIS — K210 Gastro-esophageal reflux disease with esophagitis: Secondary | ICD-10-CM | POA: Diagnosis not present

## 2017-09-28 DIAGNOSIS — E782 Mixed hyperlipidemia: Secondary | ICD-10-CM | POA: Diagnosis not present

## 2017-10-05 DIAGNOSIS — Z6841 Body Mass Index (BMI) 40.0 and over, adult: Secondary | ICD-10-CM | POA: Diagnosis not present

## 2017-10-05 DIAGNOSIS — I10 Essential (primary) hypertension: Secondary | ICD-10-CM | POA: Diagnosis not present

## 2017-10-05 DIAGNOSIS — I5023 Acute on chronic systolic (congestive) heart failure: Secondary | ICD-10-CM | POA: Diagnosis not present

## 2017-10-05 DIAGNOSIS — Z1331 Encounter for screening for depression: Secondary | ICD-10-CM | POA: Diagnosis not present

## 2017-10-05 DIAGNOSIS — E1165 Type 2 diabetes mellitus with hyperglycemia: Secondary | ICD-10-CM | POA: Diagnosis not present

## 2017-10-05 DIAGNOSIS — Z1389 Encounter for screening for other disorder: Secondary | ICD-10-CM | POA: Diagnosis not present

## 2017-10-05 DIAGNOSIS — I259 Chronic ischemic heart disease, unspecified: Secondary | ICD-10-CM | POA: Diagnosis not present

## 2017-10-10 ENCOUNTER — Ambulatory Visit (INDEPENDENT_AMBULATORY_CARE_PROVIDER_SITE_OTHER): Payer: Medicare Other | Admitting: Cardiovascular Disease

## 2017-10-10 ENCOUNTER — Encounter: Payer: Self-pay | Admitting: Cardiovascular Disease

## 2017-10-10 VITALS — BP 118/64 | HR 83 | Ht 68.0 in | Wt 276.0 lb

## 2017-10-10 DIAGNOSIS — E785 Hyperlipidemia, unspecified: Secondary | ICD-10-CM

## 2017-10-10 DIAGNOSIS — Z955 Presence of coronary angioplasty implant and graft: Secondary | ICD-10-CM | POA: Diagnosis not present

## 2017-10-10 DIAGNOSIS — I5032 Chronic diastolic (congestive) heart failure: Secondary | ICD-10-CM

## 2017-10-10 DIAGNOSIS — Z79899 Other long term (current) drug therapy: Secondary | ICD-10-CM | POA: Diagnosis not present

## 2017-10-10 DIAGNOSIS — R0609 Other forms of dyspnea: Secondary | ICD-10-CM | POA: Diagnosis not present

## 2017-10-10 DIAGNOSIS — I25118 Atherosclerotic heart disease of native coronary artery with other forms of angina pectoris: Secondary | ICD-10-CM

## 2017-10-10 DIAGNOSIS — R0602 Shortness of breath: Secondary | ICD-10-CM

## 2017-10-10 DIAGNOSIS — I10 Essential (primary) hypertension: Secondary | ICD-10-CM

## 2017-10-10 DIAGNOSIS — R0600 Dyspnea, unspecified: Secondary | ICD-10-CM

## 2017-10-10 MED ORDER — SIMVASTATIN 80 MG PO TABS: ORAL_TABLET | ORAL | 3 refills | Status: DC

## 2017-10-10 MED ORDER — RIVAROXABAN 2.5 MG PO TABS: 2.5000 mg | ORAL_TABLET | Freq: Two times a day (BID) | ORAL | 3 refills | Status: DC

## 2017-10-10 MED ORDER — ASPIRIN EC 81 MG PO TBEC: 81.0000 mg | DELAYED_RELEASE_TABLET | Freq: Every day | ORAL | 3 refills | Status: DC

## 2017-10-10 NOTE — Patient Instructions (Addendum)
Medication Instructions:  DECREASE ASPIRIN TO 81 MG DAILY   INCREASE ATORVASTATIN TO 80 MG DAILY   START XARELTO 2.5 MG TWO TIMES DAILY    Labwork: (3 MONTHS )  LIPID   Testing/Procedures: Your physician has requested that you have a lexiscan myoview. For further information please visit [email or web address] Please follow instruction sheet, as given.  Your physician has requested that you have an echocardiogram. Echocardiography is a painless test that uses sound waves to create images of your heart. It provides your doctor with information about the size and shape of your heart and how well your heart's chambers and valves are working. This procedure takes approximately one hour. There are no restrictions for this procedure.    Follow-Up: Your physician recommends that you schedule a follow-up appointment in: 2 MONTHS     Any Other Special Instructions Will Be Listed Below (If Applicable).     If you need a refill on your cardiac medications before your next appointment, please call your pharmacy.

## 2017-10-10 NOTE — Progress Notes (Signed)
SUBJECTIVE: The patient is a 77 year old male whom I last evaluated on 06/06/2015.  He presents for long overdue follow-up.  He has a history of coronary artery disease with prior stent placement, poorly controlled diabetes mellitus, hypertension , and hyperlipidemia.  He underwent coronary angiography on 05/30/2006 and underwent percutaneous transluminal coronary angioplasty and drug-eluting stent placement to the circumflex. 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 At that time ejection fraction was 35-40%.  For the past 3 weeks, he has noticed progressive exertional dyspnea.  He tells me the PA at his PCPs office increase Lasix from 60 mg to 80 mg.  He denies exertional chest pain and tightness.  He uses CPAP at night.  His A1c is up to 10% and he says he has not been eating well over the past several months.  He denies leg swelling and palpitations.  I reviewed labs dated 09/28/2017: Total cholesterol 173, to glycerides 168, HDL 36, LDL 103, sodium 139, potassium 4.2, BUN 32, creatinine 1.49.  ECG performed today which I personally reviewed demonstrates sinus rhythm with first-degree AV block, PR interval 240 ms, nonspecific intraventricular conduction delay, left axis deviation, and diffuse nonspecific ST segment and T wave abnormalities.   Review of Systems: As per "subjective", otherwise negative.  Allergies  Allergen Reactions  . Codeine Other (See Comments)    constipation  . Erythromycin-Sulfisoxazole Other (See Comments)    Causes infection in throat and eyes    Current Outpatient Medications  Medication Sig Dispense Refill  . acetaminophen (TYLENOL) 325 MG tablet Take 650 mg by mouth at bedtime. Takes 2 tabs hs    . aspirin 325 MG EC tablet Take 325 mg by mouth daily.    . Coenzyme Q10 (COQ10) 100 MG CAPS Take 1 capsule by mouth every evening.    Marland Kitchen FLUoxetine (PROZAC) 20 MG capsule Take  20 mg by mouth every evening.    . furosemide (LASIX) 40 MG tablet Take 80 mg by mouth. 2 tabs daily    . gabapentin (NEURONTIN) 100 MG capsule Take 100 mg by mouth 2 (two) times daily.     Marland Kitchen glipiZIDE (GLUCOTROL) 10 MG tablet Take 10 mg by mouth 2 (two) times daily.    Marland Kitchen glucose blood (ONE TOUCH ULTRA TEST) test strip Check your sugars twice a day 100 each 12  . Insulin Pen Needle (NOVOFINE) 30G X 8 MM MISC Inject 10 each into the skin as needed. 90 each 3  . isosorbide dinitrate (ISORDIL) 30 MG tablet Take 30 mg by mouth every evening.    . Melatonin 5 MG TABS Take 1 tablet by mouth at bedtime.    . metFORMIN (GLUCOPHAGE) 500 MG tablet Take by mouth. 2 & 1/2 tabs daily    . metoprolol (TOPROL-XL) 200 MG 24 hr tablet Take 100 mg by mouth 2 (two) times daily.    . Multiple Vitamin (MULTIVITAMIN) capsule Take 1 capsule by mouth every evening.    . Omega-3 Fatty Acids (FISH OIL) 1000 MG CAPS Take 1 capsule by mouth 2 (two) times daily.    Marland Kitchen omeprazole (PRILOSEC) 20 MG capsule Take 20 mg by mouth daily.    . ramipril (ALTACE) 5 MG capsule Take 10 mg by mouth daily.     . ranitidine (ZANTAC) 150 MG tablet Take 150 mg by mouth at bedtime.    . simvastatin (ZOCOR) 40 MG tablet Take  40 mg by mouth daily.    . Tamsulosin HCl (FLOMAX) 0.4 MG CAPS Take 0.8 mg by mouth every evening.    . zolpidem (AMBIEN) 10 MG tablet Take 10 mg by mouth at bedtime as needed for sleep.     No current facility-administered medications for this visit.     Past Medical History:  Diagnosis Date  . Chronic ischemic heart disease, unspecified   . Chronic kidney disease, stage III (moderate) (HCC)   . Diabetes mellitus without complication (HCC)    with hyperglycemia and diabetic autonomic poly neuropathy  . Dyspnea   . Gastro-esophageal reflux disease with esophagitis   . Hyperlipidemia   . Hypertension   . Morbid obesity (HCC)   . Obesity   . Obstructive sleep apnea    on CPAP  . Polyneuropathy   . Primary  insomnia     Past Surgical History:  Procedure Laterality Date  . CARDIAC CATHETERIZATION     multiple angioplasty X 8, 4 stents     Social History   Socioeconomic History  . Marital status: Married    Spouse name: Not on file  . Number of children: Not on file  . Years of education: Not on file  . Highest education level: Not on file  Occupational History  . Not on file  Social Needs  . Financial resource strain: Not on file  . Food insecurity:    Worry: Not on file    Inability: Not on file  . Transportation needs:    Medical: Not on file    Non-medical: Not on file  Tobacco Use  . Smoking status: Former Smoker    Last attempt to quit: 09/01/2000    Years since quitting: 17.1  . Smokeless tobacco: Never Used  Substance and Sexual Activity  . Alcohol use: Yes    Alcohol/week: 0.0 oz  . Drug use: No  . Sexual activity: Not on file  Lifestyle  . Physical activity:    Days per week: Not on file    Minutes per session: Not on file  . Stress: Not on file  Relationships  . Social connections:    Talks on phone: Not on file    Gets together: Not on file    Attends religious service: Not on file    Active member of club or organization: Not on file    Attends meetings of clubs or organizations: Not on file    Relationship status: Not on file  . Intimate partner violence:    Fear of current or ex partner: Not on file    Emotionally abused: Not on file    Physically abused: Not on file    Forced sexual activity: Not on file  Other Topics Concern  . Not on file  Social History Narrative  . Not on file     Vitals:   10/10/17 1408  BP: 118/64  Pulse: 83  SpO2: 94%  Weight: 276 lb (125.2 kg)  Height: 5\' 8"  (1.727 m)    Wt Readings from Last 3 Encounters:  10/10/17 276 lb (125.2 kg)  06/06/15 286 lb (129.7 kg)  06/02/15 280 lb (127 kg)     PHYSICAL EXAM General: NAD HEENT: Normal. Neck: No JVD, no thyromegaly. Lungs: Clear to auscultation bilaterally  with normal respiratory effort. CV: Regular rate and rhythm, normal S1/S2, +S3/no S4, no murmur. No pretibial or periankle edema.  No carotid bruit.   Abdomen: Soft, nontender, no distention, obese.  Neurologic: Alert and  oriented.  Psych: Normal affect. Skin: Normal. Musculoskeletal: No gross deformities.    ECG: Most recent ECG reviewed.   Labs:    Lipids: Lab Results  Component Value Date/Time   LDLCALC  09/09/2007 03:00 AM    75        Total Cholesterol/HDL:CHD Risk Coronary Heart Disease Risk Table                     Men   Women  1/2 Average Risk   3.4   3.3   CHOL  09/09/2007 03:00 AM    121        ATP III CLASSIFICATION:  <200     mg/dL   Desirable  161-096  mg/dL   Borderline High  >=045    mg/dL   High   TRIG 92 40/98/1191 03:00 AM   HDL 28 (L) 09/09/2007 03:00 AM       ASSESSMENT AND PLAN: 1.  Coronary disease with history of inferior wall MI and multivessel stenting and progressive exertional dyspnea: Currently on aspirin 325 mg, isosorbide dinitrate 30 mg every evening, ramipril 10 mg daily, Toprol-XL 100 mg twice daily, and simvastatin 40 mg. Lipids reviewed above.  I will obtain a Lexiscan Myoview stress test to evaluate for significant ischemic territories.  I will obtain an echocardiogram to evaluate cardiac structure and function.  I will increase simvastatin to 80 mg and reduce aspirin 81 mg.  I will add Xarelto 2.5 mg twice daily.  2.  Hypertension: Blood pressure is controlled.  No changes to therapy.  3.  Hyperlipidemia: Lipids reviewed above.  LDL not at goal.  I will increase simvastatin to 80 mg and repeat lipids in 3 months.  He needs dietary modification and weight loss.  4.  Chronic presumably diastolic heart failure with progressive exertional dyspnea: I will obtain an echocardiogram to evaluate cardiac structure and function.  Currently on Lasix 80 mg daily as prescribed by his PCP.    Disposition: Follow up 2 months  Time spent: 40  minutes, of which greater than 50% was spent reviewing symptoms, relevant blood tests and studies, and discussing management plan with the patient.    Prentice Docker, M.D., F.A.C.C.

## 2017-10-13 DIAGNOSIS — L110 Acquired keratosis follicularis: Secondary | ICD-10-CM | POA: Diagnosis not present

## 2017-10-13 DIAGNOSIS — B351 Tinea unguium: Secondary | ICD-10-CM | POA: Diagnosis not present

## 2017-10-13 DIAGNOSIS — E1140 Type 2 diabetes mellitus with diabetic neuropathy, unspecified: Secondary | ICD-10-CM | POA: Diagnosis not present

## 2017-10-14 ENCOUNTER — Telehealth: Payer: Self-pay

## 2017-10-14 ENCOUNTER — Encounter (HOSPITAL_COMMUNITY)
Admission: RE | Admit: 2017-10-14 | Discharge: 2017-10-14 | Disposition: A | Discharge status: Home / Self Care | Payer: Medicare Other | Source: Ambulatory Visit | Attending: Cardiovascular Disease | Admitting: Cardiovascular Disease

## 2017-10-14 ENCOUNTER — Encounter (HOSPITAL_BASED_OUTPATIENT_CLINIC_OR_DEPARTMENT_OTHER)
Admission: RE | Admit: 2017-10-14 | Discharge: 2017-10-14 | Disposition: A | Discharge status: Home / Self Care | Payer: Medicare Other | Source: Ambulatory Visit | Attending: Cardiovascular Disease | Admitting: Cardiovascular Disease

## 2017-10-14 ENCOUNTER — Encounter (HOSPITAL_COMMUNITY): Payer: Self-pay

## 2017-10-14 ENCOUNTER — Ambulatory Visit (HOSPITAL_BASED_OUTPATIENT_CLINIC_OR_DEPARTMENT_OTHER)
Admission: RE | Admit: 2017-10-14 | Discharge: 2017-10-14 | Disposition: A | Discharge status: Home / Self Care | Payer: Medicare Other | Source: Ambulatory Visit | Attending: Cardiovascular Disease | Admitting: Cardiovascular Disease

## 2017-10-14 DIAGNOSIS — E785 Hyperlipidemia, unspecified: Secondary | ICD-10-CM

## 2017-10-14 DIAGNOSIS — E119 Type 2 diabetes mellitus without complications: Secondary | ICD-10-CM

## 2017-10-14 DIAGNOSIS — I517 Cardiomegaly: Secondary | ICD-10-CM

## 2017-10-14 DIAGNOSIS — Z87891 Personal history of nicotine dependence: Secondary | ICD-10-CM

## 2017-10-14 DIAGNOSIS — R0600 Dyspnea, unspecified: Secondary | ICD-10-CM

## 2017-10-14 DIAGNOSIS — R0609 Other forms of dyspnea: Secondary | ICD-10-CM | POA: Insufficient documentation

## 2017-10-14 DIAGNOSIS — R0602 Shortness of breath: Secondary | ICD-10-CM

## 2017-10-14 LAB — NM MYOCAR MULTI W/SPECT W/WALL MOTION / EF
CHL CUP NUCLEAR SDS: 5
LHR: 0.36
LV dias vol: 174 mL (ref 62–150)
LV sys vol: 135 mL
NUC STRESS TID: 1.16
Peak HR: 79 {beats}/min
Rest HR: 67 {beats}/min
SRS: 11
SSS: 15

## 2017-10-14 MED ORDER — TECHNETIUM TC 99M TETROFOSMIN IV KIT
30.0000 | PACK | Freq: Once | INTRAVENOUS | Status: AC | PRN
  Administered 2017-10-14: 32 via INTRAVENOUS

## 2017-10-14 MED ORDER — PERFLUTREN LIPID MICROSPHERE
1.0000 mL | INTRAVENOUS | Status: DC | PRN
  Administered 2017-10-14: 2 mL via INTRAVENOUS
  Filled 2017-10-14: qty 10

## 2017-10-14 MED ORDER — REGADENOSON 0.4 MG/5ML IV SOLN
INTRAVENOUS | Status: AC
  Administered 2017-10-14: 0.4 mg via INTRAVENOUS
  Filled 2017-10-14: qty 5

## 2017-10-14 MED ORDER — SACUBITRIL-VALSARTAN 24-26 MG PO TABS: 1.0000 | ORAL_TABLET | Freq: Two times a day (BID) | ORAL | 6 refills | Status: DC

## 2017-10-14 MED ORDER — TECHNETIUM TC 99M TETROFOSMIN IV KIT
10.0000 | PACK | Freq: Once | INTRAVENOUS | Status: AC | PRN
  Administered 2017-10-14: 10 via INTRAVENOUS

## 2017-10-14 MED ORDER — SODIUM CHLORIDE 0.9% FLUSH
INTRAVENOUS | Status: AC
  Administered 2017-10-14: 10 mL via INTRAVENOUS
  Filled 2017-10-14: qty 10

## 2017-10-14 NOTE — Telephone Encounter (Signed)
Pt will make med changes, ref to EP, will stop by for entresto samples

## 2017-10-14 NOTE — Progress Notes (Signed)
*  PRELIMINARY RESULTS* Echocardiogram 2D Echocardiogram with definity has been performed.  Noah [name] 10/14/2017, 12:11 PM

## 2017-10-14 NOTE — Telephone Encounter (Signed)
-----   Message from Laqueta Linden, MD sent at 10/14/2017  4:33 PM EDT ----- Pumping function is severely reduced.  I would have him stop ramipril and start Entresto 24-26 mg twice daily 2 days later.  Have him weigh himself daily and take an extra 40 mg of Lasix for 3 pound weight gain in 24 hours or 5 pound weight gain in a week.  Make an EP referral for ICD consideration and cardiac resynchronization therapy as QRS duration is 152 ms.

## 2017-10-19 ENCOUNTER — Telehealth: Payer: Self-pay | Admitting: Cardiovascular Disease

## 2017-10-19 DIAGNOSIS — I259 Chronic ischemic heart disease, unspecified: Secondary | ICD-10-CM

## 2017-10-19 DIAGNOSIS — I5032 Chronic diastolic (congestive) heart failure: Secondary | ICD-10-CM

## 2017-10-19 NOTE — Telephone Encounter (Signed)
Asking if he can do cardiac rehab

## 2017-10-19 NOTE — Telephone Encounter (Signed)
Yes

## 2017-10-31 ENCOUNTER — Encounter

## 2017-10-31 ENCOUNTER — Ambulatory Visit: Payer: Medicare Other | Admitting: Cardiology

## 2017-11-02 ENCOUNTER — Ambulatory Visit (INDEPENDENT_AMBULATORY_CARE_PROVIDER_SITE_OTHER): Payer: Medicare Other | Admitting: Internal Medicine

## 2017-11-02 ENCOUNTER — Encounter: Payer: Self-pay | Admitting: Internal Medicine

## 2017-11-02 VITALS — BP 124/64 | HR 83 | Ht 68.0 in | Wt 278.0 lb

## 2017-11-02 DIAGNOSIS — I5032 Chronic diastolic (congestive) heart failure: Secondary | ICD-10-CM | POA: Diagnosis not present

## 2017-11-02 DIAGNOSIS — R0602 Shortness of breath: Secondary | ICD-10-CM | POA: Diagnosis not present

## 2017-11-02 NOTE — Patient Instructions (Addendum)
Medication Instructions:  Your physician recommends that you continue on your current medications as directed. Please refer to the Current Medication list given to you today.  Labwork: None ordered.  Testing/Procedures: Your physician has requested that you have an echocardiogram. Echocardiography is a painless test that uses sound waves to create images of your heart. It provides your doctor with information about the size and shape of your heart and how well your heart's chambers and valves are working. This procedure takes approximately one hour. There are no restrictions for this procedure.  Please schedule for an ECHO in 2-3 months (no sooner).  Follow-Up: I will call you with results of your ECHO.  We will review your plan at that time.   Any Other Special Instructions Will Be Listed Below (If Applicable).  Ambulatory referral to cardiac rehab at [facility].  If you need a refill on your cardiac medications before your next appointment, please call your pharmacy.

## 2017-11-02 NOTE — Progress Notes (Signed)
HPI Noah [name] is referred today by Dr. Darl Householder for evaluation of and consider for BiV ICD insertion. He is a pleasant 77 yo man with a longstanding h/o CHF. He had improvement of his LV function and over the past few months has been found to have worsening LV dysfunction. He has recently been placed on Entresto but does not feel like so far he is any better. He is sedentary and would like to start back in cardiac rehab. No syncope, chest pain but he does have class 3 symptoms and peripheral edema. Allergies  Allergen Reactions  . Codeine Other (See Comments)    constipation  . Erythromycin-Sulfisoxazole Other (See Comments)    Causes infection in throat and eyes     Current Outpatient Medications  Medication Sig Dispense Refill  . acetaminophen (TYLENOL) 650 MG CR tablet Take 1,300 mg by mouth at bedtime.    Marland Kitchen aspirin EC 81 MG tablet Take 1 tablet (81 mg total) by mouth daily. 90 tablet 3  . Coenzyme Q10 (COQ10) 100 MG CAPS Take 1 capsule by mouth every evening.    . cyclobenzaprine (FLEXERIL) 10 MG tablet Take 10 mg by mouth 3 (three) times daily as needed for spasms.  3  . finasteride (PROSCAR) 5 MG tablet Take 5 mg by mouth daily.  4  . FLUoxetine (PROZAC) 20 MG capsule Take 20 mg by mouth every evening.    . furosemide (LASIX) 40 MG tablet Take 80 mg by mouth. 2 tabs daily    . gabapentin (NEURONTIN) 100 MG capsule Take 100 mg by mouth 2 (two) times daily.     Marland Kitchen glipiZIDE (GLUCOTROL) 10 MG tablet Take 10 mg by mouth 2 (two) times daily.    Marland Kitchen glucose blood (ONE TOUCH ULTRA TEST) test strip Check your sugars twice a day 100 each 12  . Insulin Pen Needle (NOVOFINE) 30G X 8 MM MISC Inject 10 each into the skin as needed. 90 each 3  . isosorbide dinitrate (ISORDIL) 30 MG tablet Take 30 mg by mouth every evening.    . metFORMIN (GLUCOPHAGE) 500 MG tablet Take by mouth. 2 & 1/2 tabs daily    . metoprolol (TOPROL-XL) 200 MG 24 hr tablet Take 100 mg by mouth 2 (two) times daily.      . Multiple Vitamin (MULTIVITAMIN) capsule Take 1 capsule by mouth every evening.    . Omega-3 Fatty Acids (FISH OIL) 1000 MG CAPS Take 1 capsule by mouth 2 (two) times daily.    Marland Kitchen omeprazole (PRILOSEC) 20 MG capsule Take 20 mg by mouth daily.    . rivaroxaban (XARELTO) 2.5 MG TABS tablet Take 1 tablet (2.5 mg total) by mouth 2 (two) times daily. 60 tablet 3  . sacubitril-valsartan (ENTRESTO) 24-26 MG Take 1 tablet by mouth 2 (two) times daily. 60 tablet 6  . simvastatin (ZOCOR) 80 MG tablet TAKE 80 MG (1 TABLET) DAILY. 90 tablet 3  . Tamsulosin HCl (FLOMAX) 0.4 MG CAPS Take 0.8 mg by mouth every evening.    . zolpidem (AMBIEN) 10 MG tablet Take 10 mg by mouth at bedtime as needed for sleep.     No current facility-administered medications for this visit.      Past Medical History:  Diagnosis Date  . CHF (congestive heart failure) (HCC)   . Chronic ischemic heart disease, unspecified   . Chronic kidney disease, stage III (moderate) (HCC)   . Diabetes mellitus without complication (HCC)    with hyperglycemia  and diabetic autonomic poly neuropathy  . Dyspnea   . Gastro-esophageal reflux disease with esophagitis   . Hyperlipidemia   . Hypertension   . Morbid obesity (HCC)   . Obesity   . Obstructive sleep apnea    on CPAP  . Polyneuropathy   . Primary insomnia   . Renal insufficiency     ROS:   All systems reviewed and negative except as noted in the HPI.   Past Surgical History:  Procedure Laterality Date  . CARDIAC CATHETERIZATION     multiple angioplasty X 8, 4 stents      Family History  Problem Relation Age of Onset  . Heart attack Father   . Breast cancer Mother   . CAD Mother   . Cancer Brother      Social History   Socioeconomic History  . Marital status: Married    Spouse name: Not on file  . Number of children: Not on file  . Years of education: Not on file  . Highest education level: Not on file  Occupational History  . Not on file  Social  Needs  . Financial resource strain: Not on file  . Food insecurity:    Worry: Not on file    Inability: Not on file  . Transportation needs:    Medical: Not on file    Non-medical: Not on file  Tobacco Use  . Smoking status: Former Smoker    Last attempt to quit: 09/01/2000    Years since quitting: 17.1  . Smokeless tobacco: Never Used  Substance and Sexual Activity  . Alcohol use: Yes    Alcohol/week: 0.0 oz  . Drug use: No  . Sexual activity: Not on file  Lifestyle  . Physical activity:    Days per week: Not on file    Minutes per session: Not on file  . Stress: Not on file  Relationships  . Social connections:    Talks on phone: Not on file    Gets together: Not on file    Attends religious service: Not on file    Active member of club or organization: Not on file    Attends meetings of clubs or organizations: Not on file    Relationship status: Not on file  . Intimate partner violence:    Fear of current or ex partner: Not on file    Emotionally abused: Not on file    Physically abused: Not on file    Forced sexual activity: Not on file  Other Topics Concern  . Not on file  Social History Narrative  . Not on file     BP 124/64   Pulse 83   Ht 5\' 8"  (1.727 m)   Wt 278 lb (126.1 kg)   SpO2 96%   BMI 42.27 kg/m   Physical Exam:  Well appearing 77 yo man, NAD HEENT: Unremarkable Neck:  No JVD, no thyromegally Lymphatics:  No adenopathy Back:  No CVA tenderness Lungs:  Clear with no wheezes HEART:  Regular rate rhythm, no murmurs, no rubs, no clicks Abd:  soft, positive bowel sounds, no organomegally, no rebound, no guarding Ext:  2 plus pulses, 2+ peripheral edema, no cyanosis, no clubbing Skin:  No rashes no nodules Neuro:  CN II through XII intact, motor grossly intact  EKG - nsr with LBBB/IVCD  Assess/Plan: 1. Chronic systolic heart failure - he has been switched to [facility]. No obvious improvement yet. He will continue his current meds and will  restart cardiac rehab. I will ask him to repeat his 2D echo in a couple of months. If his EF remains less than 35% then we will recommend a biv ICD. If it has improved to 35-50%, then we will place a biv ppm.  2. Obesity - I discussed the importance of weight loss along with medical therapy.   Leonia Reeves.D.

## 2017-12-11 DIAGNOSIS — Z79899 Other long term (current) drug therapy: Secondary | ICD-10-CM | POA: Diagnosis not present

## 2017-12-11 DIAGNOSIS — I4891 Unspecified atrial fibrillation: Secondary | ICD-10-CM | POA: Diagnosis not present

## 2017-12-11 DIAGNOSIS — I509 Heart failure, unspecified: Secondary | ICD-10-CM | POA: Diagnosis not present

## 2017-12-11 DIAGNOSIS — E119 Type 2 diabetes mellitus without complications: Secondary | ICD-10-CM | POA: Diagnosis not present

## 2017-12-11 DIAGNOSIS — Z794 Long term (current) use of insulin: Secondary | ICD-10-CM | POA: Diagnosis not present

## 2017-12-11 DIAGNOSIS — Z87891 Personal history of nicotine dependence: Secondary | ICD-10-CM | POA: Diagnosis not present

## 2017-12-11 DIAGNOSIS — M1990 Unspecified osteoarthritis, unspecified site: Secondary | ICD-10-CM | POA: Diagnosis not present

## 2017-12-11 DIAGNOSIS — R0602 Shortness of breath: Secondary | ICD-10-CM | POA: Diagnosis not present

## 2017-12-11 DIAGNOSIS — R0600 Dyspnea, unspecified: Secondary | ICD-10-CM | POA: Diagnosis not present

## 2017-12-11 DIAGNOSIS — I447 Left bundle-branch block, unspecified: Secondary | ICD-10-CM | POA: Diagnosis not present

## 2017-12-11 DIAGNOSIS — K219 Gastro-esophageal reflux disease without esophagitis: Secondary | ICD-10-CM | POA: Diagnosis not present

## 2017-12-11 DIAGNOSIS — F329 Major depressive disorder, single episode, unspecified: Secondary | ICD-10-CM | POA: Diagnosis not present

## 2017-12-12 ENCOUNTER — Telehealth: Payer: Self-pay

## 2017-12-12 DIAGNOSIS — W19XXXA Unspecified fall, initial encounter: Secondary | ICD-10-CM | POA: Diagnosis not present

## 2017-12-12 DIAGNOSIS — T1490XA Injury, unspecified, initial encounter: Secondary | ICD-10-CM | POA: Diagnosis not present

## 2017-12-12 NOTE — Telephone Encounter (Signed)
PT called in to inform Dr. [name] that he was seen in the ED @ [facility] yesterday due to increased SOB. He stated that the [facility] may not be working for him. I will request the records from that ED visit. He has an appointment 12/16/17. He was advised to keep that appointment.

## 2017-12-16 ENCOUNTER — Encounter: Payer: Self-pay | Admitting: Cardiovascular Disease

## 2017-12-16 ENCOUNTER — Ambulatory Visit (INDEPENDENT_AMBULATORY_CARE_PROVIDER_SITE_OTHER): Payer: Medicare Other | Admitting: Cardiovascular Disease

## 2017-12-16 VITALS — BP 139/78 | HR 74 | Ht 68.0 in | Wt 281.6 lb

## 2017-12-16 DIAGNOSIS — I5042 Chronic combined systolic (congestive) and diastolic (congestive) heart failure: Secondary | ICD-10-CM | POA: Diagnosis not present

## 2017-12-16 DIAGNOSIS — I10 Essential (primary) hypertension: Secondary | ICD-10-CM | POA: Diagnosis not present

## 2017-12-16 DIAGNOSIS — I25118 Atherosclerotic heart disease of native coronary artery with other forms of angina pectoris: Secondary | ICD-10-CM | POA: Diagnosis not present

## 2017-12-16 DIAGNOSIS — I447 Left bundle-branch block, unspecified: Secondary | ICD-10-CM

## 2017-12-16 DIAGNOSIS — E785 Hyperlipidemia, unspecified: Secondary | ICD-10-CM

## 2017-12-16 DIAGNOSIS — I259 Chronic ischemic heart disease, unspecified: Secondary | ICD-10-CM | POA: Diagnosis not present

## 2017-12-16 DIAGNOSIS — Z955 Presence of coronary angioplasty implant and graft: Secondary | ICD-10-CM | POA: Diagnosis not present

## 2017-12-16 DIAGNOSIS — Z9289 Personal history of other medical treatment: Secondary | ICD-10-CM | POA: Diagnosis not present

## 2017-12-16 MED ORDER — FUROSEMIDE 40 MG PO TABS: ORAL_TABLET | ORAL | Status: DC

## 2017-12-16 MED ORDER — SACUBITRIL-VALSARTAN 49-51 MG PO TABS: 1.0000 | ORAL_TABLET | Freq: Two times a day (BID) | ORAL | 6 refills | Status: DC

## 2017-12-16 NOTE — Progress Notes (Signed)
SUBJECTIVE: The patient presents for follow-up of coronary artery disease and chronic systolic heart failure.  He has a history of coronary artery disease with prior stent placement, poorly controlled diabetes mellitus, hypertension , and hyperlipidemia.  He underwent coronary angiography on 05/30/2006 and underwent percutaneous transluminal coronary angioplasty and drug-eluting stent placement to the circumflex. 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   Echocardiogram 10/14/2017 demonstrated severely reduced left ventricular systolic function, LVEF 25 to 16%, moderate to severe LVH, diffuse hypokinesis, grade 1 diastolic dysfunction with high ventricular filling pressures.  Nuclear stress test on 10/14/2017 showed a large prior inferior myocardial infarction.  There was no significant ischemia.  He was recently evaluated in the ED at [facility] within the past week.  I reviewed all relevant documentation, labs, and studies.  He was short of breath and hypertensive.  He had bilateral leg edema.  Chest x-ray showed cardiomegaly with no active disease.  O2 saturations were normal.  Additional labs were reviewed: Sodium 138, potassium 4.8, BUN 20, creatinine 1.16, N-terminal proBNP 2845, white blood cells 12.4, hemoglobin 12.2, platelets 160.  Troponins were normal.  Independently reviewed the ECG which showed sinus rhythm with a left bundle branch block.  It was falsely interpreted as atrial fibrillation.  He was evaluated by EP on 11/02/2017.  Dr. Ladona Ridgel has ordered a repeat echocardiogram to be performed in late September and if EF is less than 35% he would recommend a biventricular ICD.  If it has improved to greater than 35% then he plans to implant a biventricular pacemaker.  He is here with his brother who is also my patient.  Upon speaking with him further, it appears he had been eating frozen dinners  consisting of a lot of sodium for about 2 weeks leading up to his ED presentation.  He said since then he has been trying to eat more chicken and fish over the last few days.  He weighs himself on occasion.  He denies chest pain.  His PCP told him to increase Lasix to 80 mg in the morning and 40 mg in the afternoon which started yesterday.  The patient has noticed decreased left leg swelling and increased urinary output.  He uses CPAP for sleep apnea.   Review of Systems: As per "subjective", otherwise negative.  Allergies  Allergen Reactions  . Codeine Other (See Comments)    constipation  . Erythromycin-Sulfisoxazole Other (See Comments)    Causes infection in throat and eyes    Current Outpatient Medications  Medication Sig Dispense Refill  . acetaminophen (TYLENOL) 650 MG CR tablet Take 1,300 mg by mouth at bedtime.    Marland Kitchen aspirin EC 81 MG tablet Take 1 tablet (81 mg total) by mouth daily. 90 tablet 3  . Coenzyme Q10 (COQ10) 100 MG CAPS Take 1 capsule by mouth every evening.    . cyclobenzaprine (FLEXERIL) 10 MG tablet Take 10 mg by mouth 3 (three) times daily as needed for spasms.  3  . finasteride (PROSCAR) 5 MG tablet Take 5 mg by mouth daily.  4  . FLUoxetine (PROZAC) 20 MG capsule Take 20 mg by mouth every evening.    . furosemide (LASIX) 40 MG tablet Take 80 mg by mouth. 2 tabs daily    . gabapentin (NEURONTIN) 100 MG capsule Take 100 mg by mouth 2 (two) times daily.     Marland Kitchen glipiZIDE (GLUCOTROL)  10 MG tablet Take 10 mg by mouth 2 (two) times daily.    Marland Kitchen glucose blood (ONE TOUCH ULTRA TEST) test strip Check your sugars twice a day 100 each 12  . Insulin Pen Needle (NOVOFINE) 30G X 8 MM MISC Inject 10 each into the skin as needed. 90 each 3  . isosorbide dinitrate (ISORDIL) 30 MG tablet Take 30 mg by mouth every evening.    . metFORMIN (GLUCOPHAGE) 500 MG tablet Take 1,500 mg by mouth daily.     . metoprolol (TOPROL-XL) 200 MG 24 hr tablet Take 100 mg by mouth 2 (two) times daily.      . Multiple Vitamin (MULTIVITAMIN) capsule Take 1 capsule by mouth every evening.    . Omega-3 Fatty Acids (FISH OIL) 1000 MG CAPS Take 1 capsule by mouth 2 (two) times daily.    Marland Kitchen omeprazole (PRILOSEC) 20 MG capsule Take 20 mg by mouth daily.    . rivaroxaban (XARELTO) 2.5 MG TABS tablet Take 1 tablet (2.5 mg total) by mouth 2 (two) times daily. 60 tablet 3  . sacubitril-valsartan (ENTRESTO) 24-26 MG Take 1 tablet by mouth 2 (two) times daily. 60 tablet 6  . simvastatin (ZOCOR) 80 MG tablet TAKE 80 MG (1 TABLET) DAILY. 90 tablet 3  . Tamsulosin HCl (FLOMAX) 0.4 MG CAPS Take 0.8 mg by mouth every evening.    . zolpidem (AMBIEN) 10 MG tablet Take 10 mg by mouth at bedtime as needed for sleep.     No current facility-administered medications for this visit.     Past Medical History:  Diagnosis Date  . CHF (congestive heart failure) (HCC)   . Chronic ischemic heart disease, unspecified   . Chronic kidney disease, stage III (moderate) (HCC)   . Diabetes mellitus without complication (HCC)    with hyperglycemia and diabetic autonomic poly neuropathy  . Dyspnea   . Gastro-esophageal reflux disease with esophagitis   . Hyperlipidemia   . Hypertension   . Morbid obesity (HCC)   . Obesity   . Obstructive sleep apnea    on CPAP  . Polyneuropathy   . Primary insomnia   . Renal insufficiency     Past Surgical History:  Procedure Laterality Date  . CARDIAC CATHETERIZATION     multiple angioplasty X 8, 4 stents     Social History   Socioeconomic History  . Marital status: Married    Spouse name: Not on file  . Number of children: Not on file  . Years of education: Not on file  . Highest education level: Not on file  Occupational History  . Not on file  Social Needs  . Financial resource strain: Not on file  . Food insecurity:    Worry: Not on file    Inability: Not on file  . Transportation needs:    Medical: Not on file    Non-medical: Not on file  Tobacco Use  . Smoking  status: Former Smoker    Last attempt to quit: 09/01/2000    Years since quitting: 17.3  . Smokeless tobacco: Never Used  Substance and Sexual Activity  . Alcohol use: Yes    Alcohol/week: 0.0 standard drinks  . Drug use: No  . Sexual activity: Not on file  Lifestyle  . Physical activity:    Days per week: Not on file    Minutes per session: Not on file  . Stress: Not on file  Relationships  . Social connections:    Talks on phone: Not  on file    Gets together: Not on file    Attends religious service: Not on file    Active member of club or organization: Not on file    Attends meetings of clubs or organizations: Not on file    Relationship status: Not on file  . Intimate partner violence:    Fear of current or ex partner: Not on file    Emotionally abused: Not on file    Physically abused: Not on file    Forced sexual activity: Not on file  Other Topics Concern  . Not on file  Social History Narrative  . Not on file     Vitals:   12/16/17 1051  BP: 139/78  Pulse: 74  Weight: 281 lb 9.6 oz (127.7 kg)  Height: 5\' 8"  (1.727 m)    Wt Readings from Last 3 Encounters:  12/16/17 281 lb 9.6 oz (127.7 kg)  11/02/17 278 lb (126.1 kg)  10/10/17 276 lb (125.2 kg)     PHYSICAL EXAM General: NAD HEENT: Normal. Neck: No JVD, no thyromegaly. Lungs: Clear to auscultation bilaterally with normal respiratory effort. CV: Regular rate and rhythm, normal S1/paradoxically split S2, no S3/S4, no murmur.  1+ pitting bilateral lower extremity edema, left greater than right.  No carotid bruit.   Abdomen: Soft, nontender, morbidly obese.  Neurologic: Alert and oriented.  Psych: Normal affect. Skin: Normal. Musculoskeletal: No gross deformities.    ECG: Reviewed above under Subjective   Labs:    Lipids: Lab Results  Component Value Date/Time   LDLCALC  09/09/2007 03:00 AM    75        Total Cholesterol/HDL:CHD Risk Coronary Heart Disease Risk Table                      Men   Women  1/2 Average Risk   3.4   3.3   CHOL  09/09/2007 03:00 AM    121        ATP III CLASSIFICATION:  <200     mg/dL   Desirable  423-536  mg/dL   Borderline High  >=144    mg/dL   High   TRIG 92 31/54/0086 03:00 AM   HDL 28 (L) 09/09/2007 03:00 AM       ASSESSMENT AND PLAN:  1.  Coronary disease with history of inferior wall MI and multivessel stenting: Symptomatically stable.  Currently on aspirin 81 mg, isosorbide dinitrate 30 mg every evening, Xarelto 2.5 mg twice daily, ramipril 10 mg daily, Toprol-XL 100 mg twice daily, and simvastatin 80 mg. Lipids previously reviewed and LDL was not at goal.  Nuclear stress test reviewed above with no evidence of ischemia and large area of inferior myocardial scar.  2.  Hypertension: Blood pressure is controlled.  No changes to therapy.  3.  Hyperlipidemia: Lipids previously reviewed.  LDL not at goal.  I increased simvastatin to 80 mg and plan to repeat lipids next month.  He needs dietary modification and weight loss.  4.  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  He was switched to Lasix 80 mg every morning and 40 mg every afternoon by his PCP yesterday with noticeable increased urinary output and decreased leg swelling.  I will continue this for now.  If Lasix fails to keep him euvolemic, I will switch to torsemide  20 mg twice daily.  I emphasized the importance of weighing daily.  I also emphasized the importance of a low-sodium diet. He was evaluated by EP on 11/02/2017.  Dr. Ladona Ridgel has ordered a repeat echocardiogram to be performed in late September and if EF is less than 35% he would recommend a biventricular ICD.  If it has improved to greater than 35% then he plans to implant a biventricular pacemaker.    5.  Morbid obesity: He needs significant weight loss.  Historically, he has not  adhered to a low-sodium, low-fat diet.   Disposition: Follow up December 2019  Time spent: 40 minutes, of which greater than 50% was spent reviewing symptoms, relevant blood tests and studies, and discussing management plan with the patient.   Time spent: 40 minutes, of which greater than 50% was spent reviewing symptoms, relevant blood tests and studies, and discussing management plan with the patient.   Prentice Docker, M.D., F.A.C.C.

## 2017-12-16 NOTE — Patient Instructions (Addendum)
Medication Instructions:   Continue your current dose of Lasix.   Increase Entresto to 49/51mg  twice a day.  Continue all other medications.    Labwork: none  Testing/Procedures: none  Follow-Up: December   Any Other Special Instructions Will Be Listed Below (If Applicable). Weight daily.   If you need a refill on your cardiac medications before your next appointment, please call your pharmacy.

## 2017-12-30 DIAGNOSIS — Z6827 Body mass index (BMI) 27.0-27.9, adult: Secondary | ICD-10-CM | POA: Diagnosis not present

## 2017-12-30 DIAGNOSIS — T63441A Toxic effect of venom of bees, accidental (unintentional), initial encounter: Secondary | ICD-10-CM | POA: Diagnosis not present

## 2018-01-02 ENCOUNTER — Encounter (HOSPITAL_COMMUNITY): Payer: Medicare Other

## 2018-01-09 ENCOUNTER — Telehealth: Payer: Self-pay

## 2018-01-09 ENCOUNTER — Ambulatory Visit (HOSPITAL_COMMUNITY)
Admission: RE | Admit: 2018-01-09 | Discharge: 2018-01-09 | Disposition: A | Discharge status: Home / Self Care | Payer: Medicare Other | Source: Ambulatory Visit | Attending: Internal Medicine | Admitting: Internal Medicine

## 2018-01-09 DIAGNOSIS — E119 Type 2 diabetes mellitus without complications: Secondary | ICD-10-CM | POA: Diagnosis not present

## 2018-01-09 DIAGNOSIS — I259 Chronic ischemic heart disease, unspecified: Secondary | ICD-10-CM | POA: Diagnosis not present

## 2018-01-09 DIAGNOSIS — Z6841 Body Mass Index (BMI) 40.0 and over, adult: Secondary | ICD-10-CM | POA: Insufficient documentation

## 2018-01-09 DIAGNOSIS — E785 Hyperlipidemia, unspecified: Secondary | ICD-10-CM | POA: Diagnosis not present

## 2018-01-09 DIAGNOSIS — R0602 Shortness of breath: Secondary | ICD-10-CM | POA: Insufficient documentation

## 2018-01-09 DIAGNOSIS — I5032 Chronic diastolic (congestive) heart failure: Secondary | ICD-10-CM | POA: Insufficient documentation

## 2018-01-09 MED ORDER — PERFLUTREN LIPID MICROSPHERE
1.0000 mL | INTRAVENOUS | Status: AC | PRN
Start: 2018-01-09 — End: 2018-01-09
  Administered 2018-01-09: 1 mL via INTRAVENOUS
  Administered 2018-01-09: 2 mL via INTRAVENOUS
  Filled 2018-01-09: qty 10

## 2018-01-09 NOTE — Progress Notes (Signed)
*  PRELIMINARY RESULTS* Echocardiogram 2D Echocardiogram has been performed with Definity.  Stacey Drain 01/09/2018, 11:27 AM

## 2018-01-09 NOTE — Telephone Encounter (Signed)
Saline lock #22 right hand established, good blood return, flushed easily with NSS, unsuccessful attempts in right AC,right upper arm . After procedure, saline lock to be removed by Celesta Gentile, Echo tech

## 2018-01-09 NOTE — Progress Notes (Signed)
I.V. used for Definity study removed @ 1056 and pressure [place]. Site was clean, dry and intact. Patient dressed and was discharged home in his brothers care.   At approximately 1110 the Cardiac Rehab Admin Assist. came to me stating the patient had fallen when leaving the waiting area with his brother. Patient was sitting upright when I arrived. Patient stated he was fine and nothing was hurt but his pride. Patient refused to go to the Emergency Department to be checked. The patient's brother, admin assist and myself helped patient into a wheelchair and I escorted him via wheelchair to his brother's car.  Celesta Gentile, RCS

## 2018-01-11 ENCOUNTER — Telehealth: Payer: Self-pay | Admitting: Cardiology

## 2018-01-11 NOTE — Telephone Encounter (Signed)
Results of echo/ tg  °

## 2018-01-11 NOTE — Telephone Encounter (Signed)
Pt requesting results from recent Echo done on 01/09/18

## 2018-01-12 DIAGNOSIS — E1140 Type 2 diabetes mellitus with diabetic neuropathy, unspecified: Secondary | ICD-10-CM | POA: Diagnosis not present

## 2018-01-12 DIAGNOSIS — B351 Tinea unguium: Secondary | ICD-10-CM | POA: Diagnosis not present

## 2018-01-12 DIAGNOSIS — L110 Acquired keratosis follicularis: Secondary | ICD-10-CM | POA: Diagnosis not present

## 2018-01-12 NOTE — Telephone Encounter (Signed)
Called wanting results for his stress test from Monday

## 2018-01-12 NOTE — Telephone Encounter (Signed)
Patient informed. Kisha working on getting scheduled for ICD.

## 2018-01-16 ENCOUNTER — Telehealth: Payer: Self-pay | Admitting: Internal Medicine

## 2018-01-16 ENCOUNTER — Encounter: Payer: Self-pay | Admitting: *Deleted

## 2018-01-16 DIAGNOSIS — I5042 Chronic combined systolic (congestive) and diastolic (congestive) heart failure: Secondary | ICD-10-CM

## 2018-01-16 NOTE — Telephone Encounter (Signed)
Pt to have ICD placement on Oct. 4 @ 1:00 pm by Dr. Ladona Ridgel. Pt notified and voiced understanding. Letter and lab slips placed at front desk for pt pick up.

## 2018-01-16 NOTE — Telephone Encounter (Signed)
Patient has questions regarding date and time of procedure and medication concerns. /t g

## 2018-01-17 ENCOUNTER — Telehealth: Payer: Self-pay | Admitting: Internal Medicine

## 2018-01-17 ENCOUNTER — Other Ambulatory Visit (HOSPITAL_COMMUNITY)
Admission: RE | Admit: 2018-01-17 | Discharge: 2018-01-17 | Disposition: A | Discharge status: Home / Self Care | Payer: Medicare Other | Source: Ambulatory Visit | Attending: Internal Medicine | Admitting: Internal Medicine

## 2018-01-17 DIAGNOSIS — I5042 Chronic combined systolic (congestive) and diastolic (congestive) heart failure: Secondary | ICD-10-CM | POA: Insufficient documentation

## 2018-01-17 LAB — CBC WITH DIFFERENTIAL/PLATELET
Basophils Absolute: 0 10*3/uL (ref 0.0–0.1)
Basophils Relative: 0 %
EOS ABS: 0.3 10*3/uL (ref 0.0–0.7)
EOS PCT: 3 %
HCT: 38.5 % — ABNORMAL LOW (ref 39.0–52.0)
HEMOGLOBIN: 12.3 g/dL — AB (ref 13.0–17.0)
LYMPHS ABS: 2.7 10*3/uL (ref 0.7–4.0)
Lymphocytes Relative: 22 %
MCH: 28.4 pg (ref 26.0–34.0)
MCHC: 31.9 g/dL (ref 30.0–36.0)
MCV: 88.9 fL (ref 78.0–100.0)
MONOS PCT: 8 %
Monocytes Absolute: 1 10*3/uL (ref 0.1–1.0)
NEUTROS PCT: 67 %
Neutro Abs: 8.5 10*3/uL — ABNORMAL HIGH (ref 1.7–7.7)
Platelets: 174 10*3/uL (ref 150–400)
RBC: 4.33 MIL/uL (ref 4.22–5.81)
RDW: 13 % (ref 11.5–15.5)
WBC: 12.5 10*3/uL — ABNORMAL HIGH (ref 4.0–10.5)

## 2018-01-17 LAB — BASIC METABOLIC PANEL
Anion gap: 8 (ref 5–15)
BUN: 38 mg/dL — AB (ref 8–23)
CHLORIDE: 98 mmol/L (ref 98–111)
CO2: 31 mmol/L (ref 22–32)
CREATININE: 1.62 mg/dL — AB (ref 0.61–1.24)
Calcium: 9.1 mg/dL (ref 8.9–10.3)
GFR calc Af Amer: 46 mL/min — ABNORMAL LOW (ref >60)
GFR calc non Af Amer: 40 mL/min — ABNORMAL LOW (ref >60)
GLUCOSE: 205 mg/dL — AB (ref 70–99)
Potassium: 4.7 mmol/L (ref 3.5–5.1)
SODIUM: 137 mmol/L (ref 135–145)

## 2018-01-17 NOTE — Telephone Encounter (Signed)
would like to speak w. Dr. Bruna Potter nurse concerning the surgery he is going to have Friday. Said someone can call him tomorrow he will be out running errands this evening

## 2018-01-18 NOTE — Telephone Encounter (Signed)
Call back received from Pt.  Pt with some questions regarding surgery.  Explained anesthesia for procedure.  Gave verbal instruction for medications to take AM of procedure:  Neurontin, prilosec, entresto and metoprolol.   Advised to hold everything else am of.  Pt asked if he would be on Entresto long term.   Advised Dr. Kirtland Bouchard his general cardiologist would decide.  No further questions

## 2018-01-18 NOTE — Telephone Encounter (Signed)
Attempted to call Pt.  Call went to VM.  Left message to call this nurse back before 5 pm today.    This nurse will not be in office tomorrow morning.  Gave Pt number to [place] office for questions regarding procedure on Friday.

## 2018-01-20 ENCOUNTER — Encounter (HOSPITAL_COMMUNITY)
Admission: RE | Disposition: A | Discharge status: Home / Self Care | Payer: Self-pay | Source: Ambulatory Visit | Attending: Internal Medicine

## 2018-01-20 ENCOUNTER — Ambulatory Visit (HOSPITAL_COMMUNITY)
Admission: RE | Admit: 2018-01-20 | Discharge: 2018-01-21 | Disposition: A | Discharge status: Home / Self Care | Payer: Medicare Other | Source: Ambulatory Visit | Attending: Internal Medicine | Admitting: Internal Medicine

## 2018-01-20 ENCOUNTER — Other Ambulatory Visit: Payer: Self-pay

## 2018-01-20 DIAGNOSIS — Z882 Allergy status to sulfonamides status: Secondary | ICD-10-CM | POA: Diagnosis not present

## 2018-01-20 DIAGNOSIS — I130 Hypertensive heart and chronic kidney disease with heart failure and stage 1 through stage 4 chronic kidney disease, or unspecified chronic kidney disease: Secondary | ICD-10-CM | POA: Insufficient documentation

## 2018-01-20 DIAGNOSIS — I255 Ischemic cardiomyopathy: Secondary | ICD-10-CM

## 2018-01-20 DIAGNOSIS — I428 Other cardiomyopathies: Secondary | ICD-10-CM | POA: Diagnosis not present

## 2018-01-20 DIAGNOSIS — Z885 Allergy status to narcotic agent status: Secondary | ICD-10-CM | POA: Diagnosis not present

## 2018-01-20 DIAGNOSIS — N183 Chronic kidney disease, stage 3 (moderate): Secondary | ICD-10-CM | POA: Insufficient documentation

## 2018-01-20 DIAGNOSIS — E785 Hyperlipidemia, unspecified: Secondary | ICD-10-CM | POA: Insufficient documentation

## 2018-01-20 DIAGNOSIS — E118 Type 2 diabetes mellitus with unspecified complications: Secondary | ICD-10-CM | POA: Diagnosis present

## 2018-01-20 DIAGNOSIS — Z7982 Long term (current) use of aspirin: Secondary | ICD-10-CM | POA: Insufficient documentation

## 2018-01-20 DIAGNOSIS — Z8249 Family history of ischemic heart disease and other diseases of the circulatory system: Secondary | ICD-10-CM | POA: Insufficient documentation

## 2018-01-20 DIAGNOSIS — E1122 Type 2 diabetes mellitus with diabetic chronic kidney disease: Secondary | ICD-10-CM | POA: Diagnosis not present

## 2018-01-20 DIAGNOSIS — Z6841 Body Mass Index (BMI) 40.0 and over, adult: Secondary | ICD-10-CM | POA: Insufficient documentation

## 2018-01-20 DIAGNOSIS — G4733 Obstructive sleep apnea (adult) (pediatric): Secondary | ICD-10-CM | POA: Insufficient documentation

## 2018-01-20 DIAGNOSIS — Z9861 Coronary angioplasty status: Secondary | ICD-10-CM

## 2018-01-20 DIAGNOSIS — Z794 Long term (current) use of insulin: Secondary | ICD-10-CM | POA: Diagnosis not present

## 2018-01-20 DIAGNOSIS — I5022 Chronic systolic (congestive) heart failure: Secondary | ICD-10-CM | POA: Diagnosis not present

## 2018-01-20 DIAGNOSIS — Z87891 Personal history of nicotine dependence: Secondary | ICD-10-CM | POA: Diagnosis not present

## 2018-01-20 DIAGNOSIS — E782 Mixed hyperlipidemia: Secondary | ICD-10-CM | POA: Diagnosis present

## 2018-01-20 DIAGNOSIS — Z95810 Presence of automatic (implantable) cardiac defibrillator: Secondary | ICD-10-CM

## 2018-01-20 DIAGNOSIS — E1142 Type 2 diabetes mellitus with diabetic polyneuropathy: Secondary | ICD-10-CM | POA: Diagnosis not present

## 2018-01-20 DIAGNOSIS — I447 Left bundle-branch block, unspecified: Secondary | ICD-10-CM | POA: Insufficient documentation

## 2018-01-20 DIAGNOSIS — Z950 Presence of cardiac pacemaker: Secondary | ICD-10-CM | POA: Diagnosis not present

## 2018-01-20 DIAGNOSIS — Z7901 Long term (current) use of anticoagulants: Secondary | ICD-10-CM | POA: Insufficient documentation

## 2018-01-20 DIAGNOSIS — I2510 Atherosclerotic heart disease of native coronary artery without angina pectoris: Secondary | ICD-10-CM

## 2018-01-20 DIAGNOSIS — N1831 Chronic kidney disease, stage 3a: Secondary | ICD-10-CM | POA: Diagnosis present

## 2018-01-20 HISTORY — PX: BIV ICD INSERTION CRT-D: EP1195

## 2018-01-20 HISTORY — DX: Ischemic cardiomyopathy: I25.5

## 2018-01-20 HISTORY — PX: BIV PACEMAKER INSERTION CRT-P: EP1199

## 2018-01-20 HISTORY — DX: Atherosclerotic heart disease of native coronary artery without angina pectoris: I25.10

## 2018-01-20 HISTORY — DX: Coronary angioplasty status: Z98.61

## 2018-01-20 HISTORY — DX: Type 2 diabetes mellitus with unspecified complications: E11.8

## 2018-01-20 LAB — GLUCOSE, CAPILLARY
GLUCOSE-CAPILLARY: 75 mg/dL (ref 70–99)
GLUCOSE-CAPILLARY: 68 mg/dL — AB (ref 70–99)
GLUCOSE-CAPILLARY: 92 mg/dL (ref 70–99)
Glucose-Capillary: 103 mg/dL — ABNORMAL HIGH (ref 70–99)
Glucose-Capillary: 68 mg/dL — ABNORMAL LOW (ref 70–99)
Glucose-Capillary: 86 mg/dL (ref 70–99)
Glucose-Capillary: 186 mg/dL — ABNORMAL HIGH (ref 70–99)

## 2018-01-20 SURGERY — BIV ICD INSERTION CRT-D

## 2018-01-20 MED ORDER — FLUOXETINE HCL 20 MG PO CAPS
20.0000 mg | ORAL_CAPSULE | Freq: Every evening | ORAL | Status: DC
  Administered 2018-01-20: 20 mg via ORAL
  Filled 2018-01-20: qty 1

## 2018-01-20 MED ORDER — DEXTROSE 5 % IV SOLN
3.0000 g | Freq: Once | INTRAVENOUS | Status: AC
  Administered 2018-01-20: 3 g via INTRAVENOUS
  Filled 2018-01-20: qty 3

## 2018-01-20 MED ORDER — ACETAMINOPHEN ER 650 MG PO TBCR: 1300.0000 mg | EXTENDED_RELEASE_TABLET | Freq: Every day | ORAL | Status: DC

## 2018-01-20 MED ORDER — HEPARIN (PORCINE) IN NACL 1000-0.9 UT/500ML-% IV SOLN
INTRAVENOUS | Status: DC | PRN
  Administered 2018-01-20: 500 mL

## 2018-01-20 MED ORDER — SODIUM CHLORIDE 0.9 % IV SOLN
80.0000 mg | INTRAVENOUS | Status: AC
  Administered 2018-01-20: 80 mg
  Filled 2018-01-20: qty 2

## 2018-01-20 MED ORDER — FUROSEMIDE 80 MG PO TABS
80.0000 mg | ORAL_TABLET | Freq: Every day | ORAL | Status: DC
  Administered 2018-01-20 – 2018-01-21 (×2): 80 mg via ORAL
  Filled 2018-01-20 (×2): qty 1

## 2018-01-20 MED ORDER — ACETAMINOPHEN 325 MG PO TABS
325.0000 mg | ORAL_TABLET | ORAL | Status: DC | PRN
  Administered 2018-01-20: 650 mg via ORAL
  Filled 2018-01-20: qty 2

## 2018-01-20 MED ORDER — CHLORHEXIDINE GLUCONATE 4 % EX LIQD
60.0000 mL | Freq: Once | CUTANEOUS | Status: DC
  Filled 2018-01-20: qty 60

## 2018-01-20 MED ORDER — TAMSULOSIN HCL 0.4 MG PO CAPS
0.8000 mg | ORAL_CAPSULE | Freq: Every evening | ORAL | Status: DC
  Administered 2018-01-20: 0.8 mg via ORAL
  Filled 2018-01-20: qty 2

## 2018-01-20 MED ORDER — ONDANSETRON HCL 4 MG/2ML IJ SOLN: 4.0000 mg | Freq: Four times a day (QID) | INTRAMUSCULAR | Status: DC | PRN

## 2018-01-20 MED ORDER — CYCLOBENZAPRINE HCL 10 MG PO TABS: 10.0000 mg | ORAL_TABLET | Freq: Three times a day (TID) | ORAL | Status: DC | PRN

## 2018-01-20 MED ORDER — METOPROLOL SUCCINATE ER 100 MG PO TB24
100.0000 mg | ORAL_TABLET | Freq: Two times a day (BID) | ORAL | Status: DC
  Administered 2018-01-20 – 2018-01-21 (×2): 100 mg via ORAL
  Filled 2018-01-20 (×2): qty 1

## 2018-01-20 MED ORDER — ASPIRIN EC 81 MG PO TBEC
81.0000 mg | DELAYED_RELEASE_TABLET | Freq: Every day | ORAL | Status: DC
  Administered 2018-01-20 – 2018-01-21 (×2): 81 mg via ORAL
  Filled 2018-01-20 (×2): qty 1

## 2018-01-20 MED ORDER — FENTANYL CITRATE (PF) 100 MCG/2ML IJ SOLN
INTRAMUSCULAR | Status: DC | PRN
  Administered 2018-01-20 (×5): 12.5 ug via INTRAVENOUS

## 2018-01-20 MED ORDER — METFORMIN HCL ER 500 MG PO TB24
1500.0000 mg | ORAL_TABLET | Freq: Every day | ORAL | Status: DC
  Administered 2018-01-21: 1500 mg via ORAL
  Filled 2018-01-20: qty 3

## 2018-01-20 MED ORDER — INFLUENZA VAC SPLIT HIGH-DOSE 0.5 ML IM SUSY: 0.5000 mL | PREFILLED_SYRINGE | INTRAMUSCULAR | Status: DC | PRN

## 2018-01-20 MED ORDER — MIDAZOLAM HCL 5 MG/5ML IJ SOLN
INTRAMUSCULAR | Status: DC | PRN
  Administered 2018-01-20 (×5): 1 mg via INTRAVENOUS

## 2018-01-20 MED ORDER — LIDOCAINE HCL 1 % IJ SOLN
INTRAMUSCULAR | Status: AC
  Filled 2018-01-20: qty 60

## 2018-01-20 MED ORDER — INSULIN GLARGINE-LIXISENATIDE 100-33 UNT-MCG/ML ~~LOC~~ SOPN: 40.0000 [IU] | PEN_INJECTOR | Freq: Every evening | SUBCUTANEOUS | Status: DC

## 2018-01-20 MED ORDER — LIDOCAINE HCL (PF) 1 % IJ SOLN
INTRAMUSCULAR | Status: DC | PRN
  Administered 2018-01-20: 60 mL

## 2018-01-20 MED ORDER — SODIUM CHLORIDE 0.9 % IV SOLN
INTRAVENOUS | Status: DC
  Administered 2018-01-20: 12:00:00 via INTRAVENOUS

## 2018-01-20 MED ORDER — MUPIROCIN 2 % EX OINT
1.0000 "application " | TOPICAL_OINTMENT | Freq: Once | CUTANEOUS | Status: AC
  Administered 2018-01-20: 1 via TOPICAL
  Filled 2018-01-20: qty 22

## 2018-01-20 MED ORDER — CEFAZOLIN SODIUM-DEXTROSE 1-4 GM/50ML-% IV SOLN
1.0000 g | Freq: Four times a day (QID) | INTRAVENOUS | Status: AC
  Administered 2018-01-20 – 2018-01-21 (×3): 1 g via INTRAVENOUS
  Filled 2018-01-20 (×3): qty 50

## 2018-01-20 MED ORDER — ADULT MULTIVITAMIN W/MINERALS CH
1.0000 | ORAL_TABLET | Freq: Every day | ORAL | Status: DC
  Administered 2018-01-20 – 2018-01-21 (×2): 1 via ORAL
  Filled 2018-01-20 (×2): qty 1

## 2018-01-20 MED ORDER — MIDAZOLAM HCL 5 MG/5ML IJ SOLN
INTRAMUSCULAR | Status: AC
  Filled 2018-01-20: qty 5

## 2018-01-20 MED ORDER — IOPAMIDOL (ISOVUE-370) INJECTION 76%
INTRAVENOUS | Status: DC | PRN
  Administered 2018-01-20: 30 mL

## 2018-01-20 MED ORDER — IOPAMIDOL (ISOVUE-370) INJECTION 76%
INTRAVENOUS | Status: AC
  Filled 2018-01-20: qty 50

## 2018-01-20 MED ORDER — GABAPENTIN 100 MG PO CAPS
100.0000 mg | ORAL_CAPSULE | Freq: Two times a day (BID) | ORAL | Status: DC
  Administered 2018-01-20 – 2018-01-21 (×2): 100 mg via ORAL
  Filled 2018-01-20 (×2): qty 1

## 2018-01-20 MED ORDER — CEFAZOLIN SODIUM-DEXTROSE 2-4 GM/100ML-% IV SOLN
2.0000 g | INTRAVENOUS | Status: DC
  Filled 2018-01-20: qty 100

## 2018-01-20 MED ORDER — HEPARIN (PORCINE) IN NACL 1000-0.9 UT/500ML-% IV SOLN
INTRAVENOUS | Status: AC
  Filled 2018-01-20: qty 500

## 2018-01-20 MED ORDER — MUPIROCIN 2 % EX OINT
TOPICAL_OINTMENT | CUTANEOUS | Status: AC
  Administered 2018-01-20: 1 via TOPICAL
  Filled 2018-01-20: qty 22

## 2018-01-20 MED ORDER — DEXTROSE 50 % IV SOLN
INTRAVENOUS | Status: DC | PRN
  Administered 2018-01-20: 25 mL via INTRAVENOUS

## 2018-01-20 MED ORDER — SODIUM CHLORIDE 0.9 % IV SOLN: INTRAVENOUS | Status: DC

## 2018-01-20 MED ORDER — SACUBITRIL-VALSARTAN 49-51 MG PO TABS
1.0000 | ORAL_TABLET | Freq: Two times a day (BID) | ORAL | Status: DC
  Administered 2018-01-20 – 2018-01-21 (×2): 1 via ORAL
  Filled 2018-01-20 (×2): qty 1

## 2018-01-20 MED ORDER — FENTANYL CITRATE (PF) 100 MCG/2ML IJ SOLN
INTRAMUSCULAR | Status: AC
  Filled 2018-01-20: qty 2

## 2018-01-20 MED ORDER — FINASTERIDE 5 MG PO TABS
5.0000 mg | ORAL_TABLET | Freq: Every evening | ORAL | Status: DC
  Administered 2018-01-20: 5 mg via ORAL
  Filled 2018-01-20: qty 1

## 2018-01-20 MED ORDER — DEXTROSE 50 % IV SOLN
INTRAVENOUS | Status: AC
  Administered 2018-01-20: 25 mL
  Filled 2018-01-20: qty 50

## 2018-01-20 MED ORDER — GLIPIZIDE ER 10 MG PO TB24
10.0000 mg | ORAL_TABLET | Freq: Two times a day (BID) | ORAL | Status: DC
  Administered 2018-01-20 – 2018-01-21 (×2): 10 mg via ORAL
  Filled 2018-01-20 (×2): qty 1

## 2018-01-20 MED ORDER — ZOLPIDEM TARTRATE 5 MG PO TABS
5.0000 mg | ORAL_TABLET | Freq: Every day | ORAL | Status: DC
  Administered 2018-01-20: 5 mg via ORAL
  Filled 2018-01-20: qty 1

## 2018-01-20 MED ORDER — ISOSORBIDE MONONITRATE ER 30 MG PO TB24
30.0000 mg | ORAL_TABLET | Freq: Every evening | ORAL | Status: DC
  Administered 2018-01-20: 30 mg via ORAL
  Filled 2018-01-20: qty 1

## 2018-01-20 MED ORDER — DEXTROSE 50 % IV SOLN
INTRAVENOUS | Status: AC
  Filled 2018-01-20: qty 50

## 2018-01-20 MED ORDER — COQ10 100 MG PO CAPS: 100.0000 mg | ORAL_CAPSULE | Freq: Every evening | ORAL | Status: DC

## 2018-01-20 MED ORDER — SODIUM CHLORIDE 0.9 % IV SOLN
INTRAVENOUS | Status: AC
  Filled 2018-01-20: qty 2

## 2018-01-20 SURGICAL SUPPLY — 18 items
ASSURA CRTD CD3369-40C (ICD Generator) ×2 IMPLANT
CABLE SURGICAL S-101-97-12 (CABLE) ×2 IMPLANT
CATH CPS DIRECT 135 DS2C020 (CATHETERS) ×1 IMPLANT
CATH HEX JOS 2-5-2 65CM 6F REP (CATHETERS) ×1 IMPLANT
CPS IMPLANT KIT 410190 (MISCELLANEOUS) ×1 IMPLANT
DEFIB ASSURA CRT-D (ICD Generator) IMPLANT
HOVERMATT SINGLE USE (MISCELLANEOUS) ×1 IMPLANT
KIT ESSENTIALS PG (KITS) ×1 IMPLANT
LEAD DURATA 7122-65CM (Lead) ×1 IMPLANT
LEAD QUARTET 1458QL-86 (Lead) IMPLANT
LEAD TENDRIL MRI 52CM LPA1200M (Lead) ×1 IMPLANT
PAD PRO RADIOLUCENT 2001M-C (PAD) ×2 IMPLANT
QUARTET 1458QL-86 (Lead) ×2 IMPLANT
SHEATH CLASSIC 7F (SHEATH) ×1 IMPLANT
SHEATH CLASSIC 8F (SHEATH) ×1 IMPLANT
SHEATH WORLEY 9FR 62CM (SHEATH) ×1 IMPLANT
TRAY PACEMAKER INSERTION (PACKS) ×2 IMPLANT
WIRE LUGE 182CM (WIRE) ×1 IMPLANT

## 2018-01-20 NOTE — H&P (Signed)
HPI [name] is referred today by Dr. Darl Householder for evaluation of and consider for BiV ICD insertion. He is a pleasant 77 yo man with a longstanding h/o CHF. He had improvement of his LV function and over the past few months has been found to have worsening LV dysfunction. He has recently been placed on Entresto but does not feel like so far he is any better. He is sedentary and would like to start back in cardiac rehab. No syncope, chest pain but he does have class 3 symptoms and peripheral edema. Allergies  Allergen Reactions  . Codeine Other (See Comments)    constipation  . Erythromycin-Sulfisoxazole Other (See Comments)    Causes infection in throat and eyes           Current Outpatient Medications  Medication Sig Dispense Refill  . acetaminophen (TYLENOL) 650 MG CR tablet Take 1,300 mg by mouth at bedtime.    Marland Kitchen aspirin EC 81 MG tablet Take 1 tablet (81 mg total) by mouth daily. 90 tablet 3  . Coenzyme Q10 (COQ10) 100 MG CAPS Take 1 capsule by mouth every evening.    . cyclobenzaprine (FLEXERIL) 10 MG tablet Take 10 mg by mouth 3 (three) times daily as needed for spasms.  3  . finasteride (PROSCAR) 5 MG tablet Take 5 mg by mouth daily.  4  . FLUoxetine (PROZAC) 20 MG capsule Take 20 mg by mouth every evening.    . furosemide (LASIX) 40 MG tablet Take 80 mg by mouth. 2 tabs daily    . gabapentin (NEURONTIN) 100 MG capsule Take 100 mg by mouth 2 (two) times daily.     Marland Kitchen glipiZIDE (GLUCOTROL) 10 MG tablet Take 10 mg by mouth 2 (two) times daily.    Marland Kitchen glucose blood (ONE TOUCH ULTRA TEST) test strip Check your sugars twice a day 100 each 12  . Insulin Pen Needle (NOVOFINE) 30G X 8 MM MISC Inject 10 each into the skin as needed. 90 each 3  . isosorbide dinitrate (ISORDIL) 30 MG tablet Take 30 mg by mouth every evening.    . metFORMIN (GLUCOPHAGE) 500 MG tablet Take by mouth. 2 & 1/2 tabs daily    . metoprolol (TOPROL-XL) 200 MG 24 hr tablet Take 100 mg by mouth 2  (two) times daily.    . Multiple Vitamin (MULTIVITAMIN) capsule Take 1 capsule by mouth every evening.    . Omega-3 Fatty Acids (FISH OIL) 1000 MG CAPS Take 1 capsule by mouth 2 (two) times daily.    Marland Kitchen omeprazole (PRILOSEC) 20 MG capsule Take 20 mg by mouth daily.    . rivaroxaban (XARELTO) 2.5 MG TABS tablet Take 1 tablet (2.5 mg total) by mouth 2 (two) times daily. 60 tablet 3  . sacubitril-valsartan (ENTRESTO) 24-26 MG Take 1 tablet by mouth 2 (two) times daily. 60 tablet 6  . simvastatin (ZOCOR) 80 MG tablet TAKE 80 MG (1 TABLET) DAILY. 90 tablet 3  . Tamsulosin HCl (FLOMAX) 0.4 MG CAPS Take 0.8 mg by mouth every evening.    . zolpidem (AMBIEN) 10 MG tablet Take 10 mg by mouth at bedtime as needed for sleep.     No current facility-administered medications for this visit.          Past Medical History:  Diagnosis Date  . CHF (congestive heart failure) (HCC)   . Chronic ischemic heart disease, unspecified   . Chronic kidney disease, stage III (moderate) (HCC)   . Diabetes mellitus without complication (HCC)  with hyperglycemia and diabetic autonomic poly neuropathy  . Dyspnea   . Gastro-esophageal reflux disease with esophagitis   . Hyperlipidemia   . Hypertension   . Morbid obesity (HCC)   . Obesity   . Obstructive sleep apnea    on CPAP  . Polyneuropathy   . Primary insomnia   . Renal insufficiency     ROS:   All systems reviewed and negative except as noted in the HPI.        Past Surgical History:  Procedure Laterality Date  . CARDIAC CATHETERIZATION     multiple angioplasty X 8, 4 stents           Family History  Problem Relation Age of Onset  . Heart attack Father   . Breast cancer Mother   . CAD Mother   . Cancer Brother      Social History        Socioeconomic History  . Marital status: Married    Spouse name: Not on file  . Number of children: Not on file  . Years of education: Not on  file  . Highest education level: Not on file  Occupational History  . Not on file  Social Needs  . Financial resource strain: Not on file  . Food insecurity:    Worry: Not on file    Inability: Not on file  . Transportation needs:    Medical: Not on file    Non-medical: Not on file  Tobacco Use  . Smoking status: Former Smoker    Last attempt to quit: 09/01/2000    Years since quitting: 17.1  . Smokeless tobacco: Never Used  Substance and Sexual Activity  . Alcohol use: Yes    Alcohol/week: 0.0 oz  . Drug use: No  . Sexual activity: Not on file  Lifestyle  . Physical activity:    Days per week: Not on file    Minutes per session: Not on file  . Stress: Not on file  Relationships  . Social connections:    Talks on phone: Not on file    Gets together: Not on file    Attends religious service: Not on file    Active member of club or organization: Not on file    Attends meetings of clubs or organizations: Not on file    Relationship status: Not on file  . Intimate partner violence:    Fear of current or ex partner: Not on file    Emotionally abused: Not on file    Physically abused: Not on file    Forced sexual activity: Not on file  Other Topics Concern  . Not on file  Social History Narrative  . Not on file     BP 124/64   Pulse 83   Ht 5\' 8"  (1.727 m)   Wt 278 lb (126.1 kg)   SpO2 96%   BMI 42.27 kg/m   Physical Exam:  Well appearing 77 yo man, NAD HEENT: Unremarkable Neck:  No JVD, no thyromegally Lymphatics:  No adenopathy Back:  No CVA tenderness Lungs:  Clear with no wheezes HEART:  Regular rate rhythm, no murmurs, no rubs, no clicks Abd:  soft, positive bowel sounds, no organomegally, no rebound, no guarding Ext:  2 plus pulses, 2+ peripheral edema, no cyanosis, no clubbing Skin:  No rashes no nodules Neuro:  CN II through XII intact, motor grossly intact  EKG - nsr with LBBB/IVCD  Assess/Plan: 1.  Chronic systolic heart failure - he  has been switched to [facility]. No obvious improvement yet. He will continue his current meds and will restart cardiac rehab. I will ask him to repeat his 2D echo in a couple of months. If his EF remains less than 35% then we will recommend a biv ICD. If it has improved to 35-50%, then we will place a biv ppm.  2. Obesity - I discussed the importance of weight loss along with medical therapy.   Gerhard Munch.  EP Attending  Patient seen and examined. Since prior clinic visit, there has been no improvement in his EF and he presents today for Biv ICD insertion. I have again reviewed the risks/benefits/goals/expectations of the procedure with the patient and he wishes to proceed.  Leonia Reeves.D.

## 2018-01-20 NOTE — Progress Notes (Signed)
Pt CBG 86, 1/2  Amp D50 given per Dr Ladona Ridgel.

## 2018-01-20 NOTE — Interval H&P Note (Signed)
History and Physical Interval Note:  01/20/2018 3:09 PM  Noah [name]  has presented today for surgery, with the diagnosis of hf  The various methods of treatment have been discussed with the patient and family. After consideration of risks, benefits and other options for treatment, the patient has consented to  Procedure(s): BIV ICD INSERTION CRT-D (N/A) as a surgical intervention .  The patient's history has been reviewed, patient examined, no change in status, stable for surgery.  I have reviewed the patient's chart and labs.  Questions were answered to the patient's satisfaction.     Lewayne Bunting

## 2018-01-20 NOTE — Progress Notes (Signed)
Pt CBG is 68, 1/2 amp of D50 given

## 2018-01-21 ENCOUNTER — Ambulatory Visit (HOSPITAL_COMMUNITY): Payer: Medicare Other

## 2018-01-21 ENCOUNTER — Encounter (HOSPITAL_COMMUNITY): Payer: Self-pay | Admitting: Cardiology

## 2018-01-21 DIAGNOSIS — Z95810 Presence of automatic (implantable) cardiac defibrillator: Secondary | ICD-10-CM | POA: Diagnosis not present

## 2018-01-21 DIAGNOSIS — N183 Chronic kidney disease, stage 3 (moderate): Secondary | ICD-10-CM | POA: Diagnosis not present

## 2018-01-21 DIAGNOSIS — Z6841 Body Mass Index (BMI) 40.0 and over, adult: Secondary | ICD-10-CM | POA: Diagnosis not present

## 2018-01-21 DIAGNOSIS — Z950 Presence of cardiac pacemaker: Secondary | ICD-10-CM | POA: Diagnosis not present

## 2018-01-21 DIAGNOSIS — I447 Left bundle-branch block, unspecified: Secondary | ICD-10-CM | POA: Diagnosis not present

## 2018-01-21 DIAGNOSIS — I428 Other cardiomyopathies: Secondary | ICD-10-CM | POA: Diagnosis not present

## 2018-01-21 DIAGNOSIS — I2510 Atherosclerotic heart disease of native coronary artery without angina pectoris: Secondary | ICD-10-CM

## 2018-01-21 DIAGNOSIS — E785 Hyperlipidemia, unspecified: Secondary | ICD-10-CM | POA: Diagnosis not present

## 2018-01-21 DIAGNOSIS — I255 Ischemic cardiomyopathy: Secondary | ICD-10-CM | POA: Diagnosis present

## 2018-01-21 DIAGNOSIS — I130 Hypertensive heart and chronic kidney disease with heart failure and stage 1 through stage 4 chronic kidney disease, or unspecified chronic kidney disease: Secondary | ICD-10-CM | POA: Diagnosis not present

## 2018-01-21 DIAGNOSIS — I5022 Chronic systolic (congestive) heart failure: Secondary | ICD-10-CM | POA: Diagnosis not present

## 2018-01-21 DIAGNOSIS — E1142 Type 2 diabetes mellitus with diabetic polyneuropathy: Secondary | ICD-10-CM | POA: Diagnosis not present

## 2018-01-21 DIAGNOSIS — E1122 Type 2 diabetes mellitus with diabetic chronic kidney disease: Secondary | ICD-10-CM | POA: Diagnosis not present

## 2018-01-21 DIAGNOSIS — Z9861 Coronary angioplasty status: Secondary | ICD-10-CM

## 2018-01-21 LAB — GLUCOSE, CAPILLARY
GLUCOSE-CAPILLARY: 252 mg/dL — AB (ref 70–99)
GLUCOSE-CAPILLARY: 214 mg/dL — AB (ref 70–99)
GLUCOSE-CAPILLARY: 224 mg/dL — AB (ref 70–99)

## 2018-01-21 MED ORDER — INSULIN ASPART 100 UNIT/ML ~~LOC~~ SOLN
0.0000 [IU] | Freq: Three times a day (TID) | SUBCUTANEOUS | Status: DC
  Administered 2018-01-21 (×2): 5 [IU] via SUBCUTANEOUS

## 2018-01-21 NOTE — Discharge Instructions (Addendum)
° ° °  Supplemental Discharge Instructions for  Pacemaker/Defibrillator Patients  Stop Xarelto for now- Dr [name] may resume at a later date  Activity No heavy lifting or vigorous activity with your left/right arm for 6 to 8 weeks.  Do not raise your left/right arm above your head for one week.  Gradually raise your affected arm as drawn below.             01/24/18                     01/25/18                   01/26/18                 01/27/18 __  NO DRIVING for1 week   ; you may begin driving on  60/10/93  .  WOUND CARE - Keep the wound area clean and dry.  Do not get this area wet for one week. No showers for one week; you may shower on 01/27/18  . - The tape/steri-strips on your wound will fall off; do not pull them off.  No bandage is needed on the site.  DO  NOT apply any creams, oils, or ointments to the wound area. - If you notice any drainage or discharge from the wound, any swelling or bruising at the site, or you develop a fever > 101? F after you are discharged home, call the office at once.  Special Instructions - You are still able to use cellular telephones; use the ear opposite the side where you have your pacemaker/defibrillator.  Avoid carrying your cellular phone near your device. - When traveling through airports, show security personnel your identification card to avoid being screened in the metal detectors.  Ask the security personnel to use the hand wand. - Avoid arc welding equipment, MRI testing (magnetic resonance imaging), TENS units (transcutaneous nerve stimulators).  Call the office for questions about other devices. - Avoid electrical appliances that are in poor condition or are not properly grounded. - Microwave ovens are safe to be near or to operate.  Additional information for defibrillator patients should your device go off: - If your device goes off ONCE and you feel fine afterward, notify the device clinic nurses. - If your device goes off ONCE and  you do not feel well afterward, call 911. - If your device goes off TWICE, call 911. - If your device goes off THREE times in one day, call 911.  DO NOT DRIVE YOURSELF OR A FAMILY MEMBER WITH A DEFIBRILLATOR TO THE HOSPITAL--CALL 911.

## 2018-01-21 NOTE — Discharge Summary (Addendum)
Patient ID: Noah [name],  MRN: [id number], DOB/AGE: 1940-12-22 77 y.o.  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 He had PCI of his LAD and RCA in the 1990's. In 2008 he had a PCI of his CFX. He recently saw Dr [name] as an OP. The pt was having dyspnea. Echo done 10/14/17 revealed an EF of 25-30%. OP Myoview did not show ischemia but also showed LVD. Dr Ivonne Andrew placed the patient on Xarelto 2.5 mg BID and ASA 81 mg daily.  He was referred to Dr Ladona Ridgel for EP evaluation and admitted 01/20/18 for elective St Jude BiV ICD implant. The pt tolerated this well and Dr Ladona Ridgel feels he can be discharged 01/21/18. Dr Ladona Ridgel has instructed the patient to not take his Xarelto until resumed by Dr [name]- at least a month per Dr Ladona Ridgel. He will f/u with dr Kristen Loader as an OP and has an appointment in the device clinic arranged.   Discharge Vitals:  Blood pressure 136/64, pulse 86, temperature 98 F (36.7 C), temperature source Oral, resp. rate 13, height 5\' 8"  (1.727 m), weight 124.9 kg, SpO2 95 %.    Labs: Results for orders placed or performed during the hospital encounter of 01/20/18 (from the past 24 hour(s))  Glucose, capillary     Status: Abnormal   Collection Time: 01/20/18 11:48 AM  Result Value Ref Range   Glucose-Capillary 68 (L) 70 - 99 mg/dL  Glucose, capillary     Status: Abnormal   Collection Time: 01/20/18 12:23 PM  Result  Value Ref Range   Glucose-Capillary 103 (H) 70 - 99 mg/dL   Comment 1 Notify RN   Glucose, capillary     Status: Abnormal   Collection Time: 01/20/18  2:38 PM  Result Value Ref Range   Glucose-Capillary 68 (L) 70 - 99 mg/dL  Glucose, capillary     Status: None   Collection Time: 01/20/18  3:53 PM  Result Value Ref Range   Glucose-Capillary 86 70 - 99 mg/dL  Glucose, capillary     Status: None   Collection Time: 01/20/18  6:09 PM  Result Value Ref Range   Glucose-Capillary 92 70 - 99 mg/dL  Glucose, capillary     Status: Abnormal   Collection Time: 01/20/18  9:02 PM  Result Value Ref Range   Glucose-Capillary 186 (H) 70 - 99 mg/dL  Glucose, capillary     Status: Abnormal   Collection Time: 01/21/18  6:09 AM  Result Value Ref Range   Glucose-Capillary 252 (H) 70 - 99 mg/dL  Glucose, capillary     Status: Abnormal   Collection Time: 01/21/18  7:33 AM  Result Value Ref Range   Glucose-Capillary 214 (H) 70 - 99 mg/dL    Disposition:  Follow-up Information    [facility] Follow up on 01/31/2018.   Specialty:  Cardiology Why:  10:00AM, wound check visit Contact information: [address]  [place] [address] [phone]       Marinus Maw, MD Follow up on 04/25/2018.   Specialty:  Cardiology Why:  11:30AM Contact information: [address] [place] [address] [phone]        Laqueta Linden, MD Follow up on 04/04/2018.   Specialty:  Cardiology Why:  11:40AM Contact information: [address] [place] [address] [phone]           Discharge Medications:  Allergies as of 01/21/2018      Reactions   Codeine Other (See Comments)   constipation   Erythromycin-sulfisoxazole Other (See Comments)   Causes infection in throat and eyes      Medication List    STOP taking these medications   rivaroxaban 2.5 MG Tabs tablet Commonly known as:  XARELTO     TAKE these  medications   acetaminophen 650 MG CR tablet Commonly known as:  TYLENOL Take 1,300 mg by mouth at bedtime.   aspirin EC 81 MG tablet Take 1 tablet (81 mg total) by mouth daily.   CoQ10 100 MG Caps Take 100 mg by mouth every evening.   cyclobenzaprine 10 MG tablet Commonly known as:  FLEXERIL Take 10 mg by mouth 3 (three) times daily as needed for spasms.   finasteride 5 MG tablet Commonly known as:  PROSCAR Take 5 mg by mouth every evening.   Fish Oil 1000 MG Caps Take 1,000 mg by mouth 2 (two) times daily.   FLUoxetine 20 MG capsule Commonly known as:  PROZAC Take 20 mg by mouth every evening.   furosemide 40 MG tablet Commonly known as:  LASIX Take 2 tabs (80mg ) by mouth every morning & 1 tab (40mg ) every evening What changed:    how much to take  how to take this  when to take this  additional instructions   gabapentin 100 MG capsule Commonly known as:  NEURONTIN Take 100 mg by mouth 2 (two) times daily.   glipiZIDE 10 MG 24 hr tablet Commonly known as:  GLUCOTROL XL Take 10 mg by mouth 2 (two) times daily.   glucose blood test strip Check your sugars twice a day   Insulin Pen Needle 30G X 8 MM Misc Commonly known as:  NOVOFINE Inject 10 each into the skin as needed.   isosorbide mononitrate 30 MG 24 hr tablet Commonly known as:  IMDUR Take 30 mg by mouth every evening.   metFORMIN 500 MG 24 hr tablet Commonly known as:  GLUCOPHAGE-XR Take 1,500 mg by mouth daily.   metoprolol 200 MG 24 hr tablet Commonly known as:  TOPROL-XL Take 100 mg by mouth 2 (two) times daily.   multivitamin capsule Take 1 capsule by mouth daily.   omeprazole 20 MG capsule Commonly known as:  PRILOSEC Take 20 mg by mouth daily.   ranitidine 150 MG tablet Commonly known as:  ZANTAC Take 150 mg by mouth at bedtime.   sacubitril-valsartan 49-51 MG Commonly known as:  ENTRESTO Take 1 tablet by mouth 2 (two) times daily.   simvastatin 80 MG tablet Commonly known  as:  ZOCOR TAKE 80 MG (1 TABLET) DAILY. What changed:    how much to take  how to take this  when to take this   SOLIQUA 100-33 UNT-MCG/ML Sopn Generic drug:  Insulin Glargine-Lixisenatide Inject 40 Units into the skin every evening.   tamsulosin 0.4 MG Caps capsule Commonly known as:  FLOMAX Take 0.8 mg by mouth  every evening.   zolpidem 10 MG tablet Commonly known as:  AMBIEN Take 10 mg by mouth at bedtime.        Duration of Discharge Encounter: Greater than 30 minutes including physician time.  Jolene Provost PA-C 01/21/2018 11:30 AM   EP Attending  Patient seen and examined. Agree with above. He is stable for DC. See my note.  Leonia Reeves.D.

## 2018-01-21 NOTE — Progress Notes (Signed)
Progress Note  Patient Name: Noah [name] Date of Encounter: 01/21/2018  Primary Cardiologist: Prentice Docker, MD   Subjective   No chest pain or sob. Feels well.   Inpatient Medications    Scheduled Meds: . aspirin EC  81 mg Oral Daily  . finasteride  5 mg Oral QPM  . FLUoxetine  20 mg Oral QPM  . furosemide  80 mg Oral Daily  . gabapentin  100 mg Oral BID  . glipiZIDE  10 mg Oral BID  . insulin aspart  0-15 Units Subcutaneous TID WC  . Insulin Glargine-Lixisenatide  40 Units Subcutaneous QPM  . isosorbide mononitrate  30 mg Oral QPM  . metFORMIN  1,500 mg Oral Q breakfast  . metoprolol  100 mg Oral BID  . multivitamin with minerals  1 tablet Oral Daily  . sacubitril-valsartan  1 tablet Oral BID  . tamsulosin  0.8 mg Oral QPM  . zolpidem  5 mg Oral QHS   Continuous Infusions:  PRN Meds: acetaminophen, cyclobenzaprine, Influenza vac split quadrivalent PF, ondansetron (ZOFRAN) IV   Vital Signs    Vitals:   01/20/18 1948 01/20/18 2014 01/20/18 2207 01/21/18 0428  BP: 131/76  (!) 120/50 136/64  Pulse: 84  88 86  Resp:      Temp:  97.6 F (36.4 C)  98 F (36.7 C)  TempSrc:  Axillary  Oral  SpO2: 98%   95%  Weight:    124.9 kg  Height:        Intake/Output Summary (Last 24 hours) at 01/21/2018 0943 Last data filed at 01/21/2018 0600 Gross per 24 hour  Intake 953.33 ml  Output 625 ml  Net 328.33 ml   Filed Weights   01/20/18 1126 01/21/18 0428  Weight: 121.6 kg 124.9 kg    Telemetry    nsr with biv pacing - Personally Reviewed  ECG    nsr with biv pacing - Personally Reviewed  Physical Exam   GEN: No acute distress.   Neck: 6 cm JVD Cardiac: RRR, no murmurs, rubs, or gallops.  Respiratory: Clear to auscultation bilaterally. No hematoma GI: Soft, nontender, non-distended  MS: No edema; No deformity. Neuro:  Nonfocal  Psych: Normal affect   Labs    Chemistry Recent Labs  Lab 01/17/18 1535  NA 137  K 4.7  CL 98  CO2 31  GLUCOSE  205*  BUN 38*  CREATININE 1.62*  CALCIUM 9.1  GFRNONAA 40*  GFRAA 46*  ANIONGAP 8     Hematology Recent Labs  Lab 01/17/18 1535  WBC 12.5*  RBC 4.33  HGB 12.3*  HCT 38.5*  MCV 88.9  MCH 28.4  MCHC 31.9  RDW 13.0  PLT 174    Cardiac EnzymesNo results for input(s): TROPONINI in the last 168 hours. No results for input(s): TROPIPOC in the last 168 hours.   BNPNo results for input(s): BNP, PROBNP in the last 168 hours.   DDimer No results for input(s): DDIMER in the last 168 hours.   Radiology    Dg Chest 2 View  Result Date: 01/21/2018 CLINICAL DATA:  ICD EXAM: CHEST - 2 VIEW COMPARISON:  12/11/2017 FINDINGS: Left ICD in place with leads in the right atrium and right ventricle as well as coronary sinus. No pneumothorax. Cardiomegaly with vascular congestion. No confluent opacities. Small left effusion. Lingular atelectasis or scarring. IMPRESSION: Interval placement of left AICD as above.  No pneumothorax. Cardiomegaly. Lingular scarring or atelectasis with small left effusion. Electronically Signed  By: Charlett Nose M.D.   On: 01/21/2018 08:06    Cardiac Studies   none  Patient Profile     77 y.o. male admitted for biv ICD insertion.   Assessment & Plan    1. ICD - His St. Jude Biv ICD is working normally. His CXR looks good. Device interogation under my direction demonstrates normal device funtion. Incision demonstrates no hematoma. He can be discharged home with usual followup. Continue all home meds.  Gregg Taylor,M.D.  For questions or updates, please contact [facility] Please consult [email or web address] for contact info under Cardiology/STEMI.      Signed, Lewayne Bunting, MD  01/21/2018, 9:43 AM  Patient ID: Noah [name], male   DOB: Sep 20, 1940, 77 y.o.   MRN: [id number]

## 2018-01-23 ENCOUNTER — Encounter (HOSPITAL_COMMUNITY): Payer: Self-pay | Admitting: Internal Medicine

## 2018-01-24 MED FILL — Lidocaine HCl Local Inj 1%: INTRAMUSCULAR | Qty: 60 | Status: AC

## 2018-01-26 ENCOUNTER — Telehealth: Payer: Self-pay | Admitting: Internal Medicine

## 2018-01-26 ENCOUNTER — Telehealth: Payer: Self-pay

## 2018-01-26 NOTE — Telephone Encounter (Signed)
Returned call to Pt.  Advised ok to remove bandage-careful to leave steri strips in place.  Ok to shower-advised to not let water hit site directly.  Pt indicates understanding.

## 2018-01-26 NOTE — Telephone Encounter (Signed)
New Message:     Patient would like to remove the bandage over the incision to take a shower.

## 2018-01-31 ENCOUNTER — Ambulatory Visit (INDEPENDENT_AMBULATORY_CARE_PROVIDER_SITE_OTHER): Payer: Medicare Other | Admitting: *Deleted

## 2018-01-31 ENCOUNTER — Telehealth: Payer: Self-pay

## 2018-01-31 DIAGNOSIS — I255 Ischemic cardiomyopathy: Secondary | ICD-10-CM | POA: Diagnosis not present

## 2018-01-31 DIAGNOSIS — I447 Left bundle-branch block, unspecified: Secondary | ICD-10-CM

## 2018-01-31 DIAGNOSIS — I5042 Chronic combined systolic (congestive) and diastolic (congestive) heart failure: Secondary | ICD-10-CM

## 2018-01-31 LAB — CUP PACEART INCLINIC DEVICE CHECK
Brady Statistic RV Percent Paced: 88 %
Date Time Interrogation Session: 20191015113136
HIGH POWER IMPEDANCE MEASURED VALUE: 69.75 Ohm
Implantable Lead Implant Date: 20191004
Implantable Lead Location: 753859
Implantable Lead Model: 7122
Implantable Pulse Generator Implant Date: 20191004
Lead Channel Impedance Value: 487.5 Ohm
Lead Channel Impedance Value: 762.5 Ohm
Lead Channel Pacing Threshold Amplitude: 1.75 V
Lead Channel Pacing Threshold Pulse Width: 0.5 ms
Lead Channel Pacing Threshold Pulse Width: 0.5 ms
Lead Channel Setting Pacing Amplitude: 2.25 V
Lead Channel Setting Pacing Pulse Width: 0.5 ms
MDC IDC LEAD IMPLANT DT: 20191004
MDC IDC LEAD IMPLANT DT: 20191004
MDC IDC LEAD LOCATION: 753858
MDC IDC LEAD LOCATION: 753860
MDC IDC MSMT BATTERY REMAINING LONGEVITY: 75 mo
MDC IDC MSMT LEADCHNL LV PACING THRESHOLD AMPLITUDE: 1.75 V
MDC IDC MSMT LEADCHNL LV PACING THRESHOLD PULSEWIDTH: 0.7 ms
MDC IDC MSMT LEADCHNL LV PACING THRESHOLD PULSEWIDTH: 0.7 ms
MDC IDC MSMT LEADCHNL RA SENSING INTR AMPL: 0.9 mV
MDC IDC MSMT LEADCHNL RV IMPEDANCE VALUE: 475 Ohm
MDC IDC MSMT LEADCHNL RV PACING THRESHOLD AMPLITUDE: 0.75 V
MDC IDC MSMT LEADCHNL RV PACING THRESHOLD AMPLITUDE: 0.75 V
MDC IDC MSMT LEADCHNL RV SENSING INTR AMPL: 12 mV
MDC IDC SET LEADCHNL RA PACING AMPLITUDE: 3.5 V
MDC IDC SET LEADCHNL RV PACING AMPLITUDE: 2 V
MDC IDC SET LEADCHNL RV PACING PULSEWIDTH: 0.5 ms
MDC IDC SET LEADCHNL RV SENSING SENSITIVITY: 0.5 mV
MDC IDC STAT BRADY RA PERCENT PACED: 0.3 %
Pulse Gen Serial Number: 9810994

## 2018-01-31 NOTE — Progress Notes (Signed)
Normal device function. Thresholds, sensing, and impedances consistent with implant measurements. LV threshold higher than at implant by 1v, autocapture on with a +0.25v safety margin. LV autocapture readings lower than in-office test today. Device programmed at 3.5V/ autocapture programmed on for extra safety margin until 3 month visit. Histogram distribution appropriate for patient and level of activity. 13% AT/AF, longest episode 14.5hrs. AF is not noted in patient history although he was previously on Xarelto (prescribed by Dr. [name]) and it was Spray at discharge 01/21/18 for at least a month per GT. No ventricular arrhythmias noted. Patient educated about wound care, arm mobility, lifting restrictions, shock plan and Merlin monitoring. ROV with GT 04/25/18.

## 2018-01-31 NOTE — Telephone Encounter (Addendum)
While the pt was here having his device checked he requested samples of Entresto. We do not have any samples of Entresto as they are no longer provided by the manufacturer.  I offered to help him fill out an [facility] pt asst application while here and he agreed.  We completed the application together and I have placed it in Dr Bruna Potter mail bin awaiting his signature.  Sherryll Burger was prescribed by Dr [name] (not Dr Ladona Ridgel) but since the pt was here at this office I wanted to help him as quickly as possible due to the cost of Entresto. All future inquiries concerning Sherryll Burger should be addressed by Dr Darl Householder in our [place] office.

## 2018-02-01 ENCOUNTER — Telehealth: Payer: Self-pay

## 2018-02-01 DIAGNOSIS — E7800 Pure hypercholesterolemia, unspecified: Secondary | ICD-10-CM | POA: Diagnosis not present

## 2018-02-01 DIAGNOSIS — K210 Gastro-esophageal reflux disease with esophagitis: Secondary | ICD-10-CM | POA: Diagnosis not present

## 2018-02-01 DIAGNOSIS — E782 Mixed hyperlipidemia: Secondary | ICD-10-CM | POA: Diagnosis not present

## 2018-02-01 DIAGNOSIS — E1165 Type 2 diabetes mellitus with hyperglycemia: Secondary | ICD-10-CM | POA: Diagnosis not present

## 2018-02-01 DIAGNOSIS — I10 Essential (primary) hypertension: Secondary | ICD-10-CM | POA: Diagnosis not present

## 2018-02-01 DIAGNOSIS — I259 Chronic ischemic heart disease, unspecified: Secondary | ICD-10-CM | POA: Diagnosis not present

## 2018-02-01 DIAGNOSIS — R0902 Hypoxemia: Secondary | ICD-10-CM | POA: Diagnosis not present

## 2018-02-01 MED ORDER — RIVAROXABAN 20 MG PO TABS: 20.0000 mg | ORAL_TABLET | Freq: Every day | ORAL | 3 refills | Status: DC

## 2018-02-01 NOTE — Telephone Encounter (Signed)
-----   Message from Laqueta Linden, MD sent at 02/01/2018 10:43 AM EDT ----- Regarding: FW: a fib/xarelto Please start Xarelto 20 mg daily for a fib.   ----- Message ----- From: Marinus Maw, MD Sent: 02/01/2018   9:28 AM EDT To: Laqueta Linden, MD Subject: RE: a fib/xarelto                              You can start it now. GT ----- Message ----- From: Laqueta Linden, MD Sent: 01/31/2018  11:37 AM EDT To: Marinus Maw, MD Subject: a Donah Driver, When would it be ok to start Xarelto 20 mg daily on this patient?  Darliss Ridgel  ----- Message ----- From: Bethanie Dicker, RN Sent: 01/31/2018  11:31 AM EDT To: Laqueta Linden, MD  Please review AF burden and Xarelto ([place])

## 2018-02-01 NOTE — Telephone Encounter (Signed)
Pt made aware. RX sent to [facility].

## 2018-02-07 ENCOUNTER — Telehealth: Payer: Self-pay | Admitting: Cardiovascular Disease

## 2018-02-07 NOTE — Telephone Encounter (Signed)
Patient would like to speak with nurse regarding not being able to afford Xarelto. / tg

## 2018-02-07 NOTE — Telephone Encounter (Signed)
Will forward to front office.

## 2018-02-07 NOTE — Telephone Encounter (Signed)
Pt would like to switch to Coumadin. He cannot afford xarelto and he knows he makes too much money to get assistance. Please advise.

## 2018-02-07 NOTE — Telephone Encounter (Signed)
That would be fine.  Have him see Misty Stanley for anticoagulation clinic enrollment and monitoring and for initiation.

## 2018-02-07 NOTE — Telephone Encounter (Signed)
Please have Aurther Loft schedule as a new patient.  I will need to see him about a week before he runs out of Xarelto. Thanks

## 2018-02-08 DIAGNOSIS — N401 Enlarged prostate with lower urinary tract symptoms: Secondary | ICD-10-CM | POA: Diagnosis not present

## 2018-02-08 DIAGNOSIS — N183 Chronic kidney disease, stage 3 (moderate): Secondary | ICD-10-CM | POA: Diagnosis not present

## 2018-02-08 DIAGNOSIS — Z6841 Body Mass Index (BMI) 40.0 and over, adult: Secondary | ICD-10-CM | POA: Diagnosis not present

## 2018-02-08 DIAGNOSIS — I259 Chronic ischemic heart disease, unspecified: Secondary | ICD-10-CM | POA: Diagnosis not present

## 2018-02-08 DIAGNOSIS — I10 Essential (primary) hypertension: Secondary | ICD-10-CM | POA: Diagnosis not present

## 2018-02-08 DIAGNOSIS — Z23 Encounter for immunization: Secondary | ICD-10-CM | POA: Diagnosis not present

## 2018-02-08 DIAGNOSIS — I5023 Acute on chronic systolic (congestive) heart failure: Secondary | ICD-10-CM | POA: Diagnosis not present

## 2018-02-08 DIAGNOSIS — E1165 Type 2 diabetes mellitus with hyperglycemia: Secondary | ICD-10-CM | POA: Diagnosis not present

## 2018-02-10 ENCOUNTER — Telehealth: Payer: Self-pay | Admitting: Cardiovascular Disease

## 2018-02-10 NOTE — Telephone Encounter (Signed)
Per voicemail-- pt is in doughnut hole w/ his Xarelto. Wants to know if he can change to something else.

## 2018-02-10 NOTE — Telephone Encounter (Signed)
At made with Vashti Hey on 02/21/18 at [facility] office at 1030 am   2 bottles xarelto 20 mg given to pt so he doesn't run out  Lot18mg 952, exp 8/21

## 2018-02-10 NOTE — Telephone Encounter (Signed)
Pt switching to coumadin, await new pt apt with Noah [name]

## 2018-02-16 ENCOUNTER — Encounter (HOSPITAL_COMMUNITY): Payer: Medicare Other

## 2018-02-17 ENCOUNTER — Encounter (HOSPITAL_COMMUNITY): Payer: Self-pay

## 2018-02-17 ENCOUNTER — Encounter (HOSPITAL_COMMUNITY)
Admission: RE | Admit: 2018-02-17 | Discharge: 2018-02-17 | Disposition: A | Discharge status: Home / Self Care | Payer: Medicare Other | Source: Ambulatory Visit | Attending: Internal Medicine | Admitting: Internal Medicine

## 2018-02-17 VITALS — BP 140/76 | HR 76 | Ht 69.0 in | Wt 284.0 lb

## 2018-02-17 DIAGNOSIS — I5022 Chronic systolic (congestive) heart failure: Secondary | ICD-10-CM | POA: Diagnosis not present

## 2018-02-17 NOTE — Progress Notes (Signed)
Daily Session Note  Patient Details  Name: Noah [name] MRN: [id number] Date of Birth: 1940-12-28 Referring Provider:    Encounter Date: 02/17/2018  Check In: Session Check In - 02/17/18 1230      Check-In   Supervising physician immediately available to respond to emergencies  See telemetry face sheet for immediately available MD    Location  [facility]    Staff Present  Russella Dar, MS, EP, [facility], Exercise Physiologist;Amanda Ballard, Exercise Physiologist;Debra Wynetta Emery, RN, BSN    Medication changes reported      No    Fall or balance concerns reported     Yes    Comments  Patient has fallen 4 times in the past 12 months    Tobacco Cessation  --   Quit 1999   Resistance Training Performed  Yes    VAD Patient?  No    PAD/SET Patient?  No      Pain Assessment   Currently in Pain?  No/denies    Multiple Pain Sites  No       Capillary Blood Glucose: No results found for this or any previous visit (from the past 24 hour(s)).    Social History   Tobacco Use  Smoking Status Former Smoker  . Last attempt to quit: 09/01/2000  . Years since quitting: 17.4  Smokeless Tobacco Never Used    Goals Met:  Independence with exercise equipment Exercise tolerated well Personal goals reviewed No report of cardiac concerns or symptoms Strength training completed today  Goals Unmet:  Not Applicable  Comments: Check out: 1500   Dr. Kate Sable is Medical Director for [place] and Pulmonary Rehab.

## 2018-02-17 NOTE — Progress Notes (Signed)
Cardiac/Pulmonary Rehab Medication Review by a Pharmacist  Does the patient  feel that his/her medications are working for him/her?  yes  Has the patient been experiencing any side effects to the medications prescribed?  yes  Does the patient measure his/her own blood pressure or blood glucose at home?  yes   Does the patient have any problems obtaining medications due to transportation or finances?   yes  Understanding of regimen: excellent Understanding of indications: excellent Potential of compliance: excellent  Questions asked to Determine Patient Understanding of Medication Regimen:  1. What is the name of the medication?  2. What is the medication used for?  3. When should it be taken?  4. How much should be taken?  5. How will you take it?  6. What side effects should you report?  Understanding Defined as: Excellent: All questions above are correct Good: Questions 1-4 are correct Fair: Questions 1-2 are correct  Poor: 1 or none of the above questions are correct   Pharmacist comments:  Noah [name] just recently had pacemaker placed and starting cardiac rehab. We reviewed his medications and identified some issues. Patient has had some "leg issues"(aching) after exercising with the increase in simvastatin dose from 40mg --> 80mg  daily. Still concerned with cost of Xarelto which he is transitioning to coumadin and the entresto since pt is in the donut hole with medicare. Patient is monitoring his blood glucose but does not have a BP cuff to monitor. Recommended that he have MD write RX to obtain a BP cuff. Patient is compliant with his regimen and knowledgeable.   Thanks for the opportunity to participate in the care of this patient,  Noah [name], BS Loura Back, [place] Clinical Pharmacist Pager [phone]  02/17/2018 2:06 PM

## 2018-02-17 NOTE — Progress Notes (Signed)
Cardiac Individual Treatment Plan  Patient Details  Name: Noah [name] MRN: [id number] Date of Birth: [birth year] Referring Provider:     CARDIAC REHAB PHASE II ORIENTATION from 02/17/2018 in [place] [facility]  Referring Provider  [name]      Initial Encounter Date:    CARDIAC REHAB PHASE II ORIENTATION from 02/17/2018 in [facility]  Date  02/17/18      Visit Diagnosis: CHF (congestive heart failure), NYHA class I, chronic, systolic (HCC)  Patient's Home Medications on Admission:  Current Outpatient Medications:  .  acetaminophen (TYLENOL) 650 MG CR tablet, Take 1,300 mg by mouth at bedtime., Disp: , Rfl:  .  aspirin EC 81 MG tablet, Take 1 tablet (81 mg total) by mouth daily., Disp: 90 tablet, Rfl: 3 .  Coenzyme Q10 (COQ10) 100 MG CAPS, Take 100 mg by mouth every evening. , Disp: , Rfl:  .  cyclobenzaprine (FLEXERIL) 10 MG tablet, Take 10 mg by mouth 3 (three) times daily as needed for spasms., Disp: , Rfl: 3 .  finasteride (PROSCAR) 5 MG tablet, Take 5 mg by mouth every evening. , Disp: , Rfl: 4 .  FLUoxetine (PROZAC) 20 MG capsule, Take 20 mg by mouth every evening., Disp: , Rfl:  .  furosemide (LASIX) 40 MG tablet, Take 2 tabs (80mg ) by mouth every morning & 1 tab (40mg ) every evening (Patient taking differently: Take 80 mg by mouth daily. ), Disp: 30 tablet, Rfl:  .  gabapentin (NEURONTIN) 100 MG capsule, Take 100 mg by mouth 2 (two) times daily. , Disp: , Rfl:  .  glipiZIDE (GLUCOTROL XL) 10 MG 24 hr tablet, Take 10 mg by mouth 2 (two) times daily., Disp: , Rfl:  .  glucose blood (ONE TOUCH ULTRA TEST) test strip, Check your sugars twice a day, Disp: 100 each, Rfl: 12 .  Insulin Glargine-Lixisenatide (SOLIQUA) 100-33 UNT-MCG/ML SOPN, Inject 40 Units into the skin every evening., Disp: , Rfl:  .  Insulin Pen Needle (NOVOFINE) 30G X 8 MM MISC, Inject 10 each into the skin as needed., Disp: 90 each, Rfl: 3 .  isosorbide mononitrate (IMDUR)  30 MG 24 hr tablet, Take 30 mg by mouth every evening. , Disp: , Rfl: 3 .  metFORMIN (GLUCOPHAGE-XR) 500 MG 24 hr tablet, Take 1,500 mg by mouth daily., Disp: , Rfl:  .  metoprolol (TOPROL-XL) 200 MG 24 hr tablet, Take 100 mg by mouth 2 (two) times daily., Disp: , Rfl:  .  Multiple Vitamin (MULTIVITAMIN) capsule, Take 1 capsule by mouth daily. , Disp: , Rfl:  .  Omega-3 Fatty Acids (FISH OIL) 1000 MG CAPS, Take 1,000 mg by mouth 2 (two) times daily. , Disp: , Rfl:  .  omeprazole (PRILOSEC) 20 MG capsule, Take 20 mg by mouth daily., Disp: , Rfl:  .  ranitidine (ZANTAC) 150 MG tablet, Take 150 mg by mouth at bedtime., Disp: , Rfl:  .  rivaroxaban (XARELTO) 20 MG TABS tablet, Take 1 tablet (20 mg total) by mouth daily with supper., Disp: 30 tablet, Rfl: 3 .  sacubitril-valsartan (ENTRESTO) 49-51 MG, Take 1 tablet by mouth 2 (two) times daily., Disp: 60 tablet, Rfl: 6 .  simvastatin (ZOCOR) 80 MG tablet, TAKE 80 MG (1 TABLET) DAILY. (Patient taking differently: Take 80 mg by mouth every evening. TAKE 80 MG (1 TABLET) DAILY.), Disp: 90 tablet, Rfl: 3 .  Tamsulosin HCl (FLOMAX) 0.4 MG CAPS, Take 0.8 mg by mouth every evening., Disp: , Rfl:  .  zolpidem (AMBIEN) 10 MG tablet, Take 10 mg by mouth at bedtime. , Disp: , Rfl:   Past Medical History: Past Medical History:  Diagnosis Date  . CAD S/P percutaneous coronary angioplasty    remote PCIs in 1990's- last CFX PCI 2008  . Chronic kidney disease, stage III (moderate) (HCC)   . Diabetes mellitus with complication (HCC)    with hyperglycemia and diabetic autonomic poly neuropathy  . Gastro-esophageal reflux disease with esophagitis   . Hyperlipidemia   . Hypertension   . Ischemic cardiomyopathy 01/20/2018   St Jude ICD   . Morbid obesity (HCC)   . Obstructive sleep apnea    on CPAP  . Polyneuropathy   . Primary insomnia     Tobacco Use: Social History   Tobacco Use  Smoking Status Former Smoker  . Packs/day: 1.00  . Years: 30.00  .  Pack years: 30.00  . Last attempt to quit: 09/01/2000  . Years since quitting: 17.4  Smokeless Tobacco Never Used    Labs: Recent Review Advice worker    Labs for ITP Cardiac and Pulmonary Rehab 09/09/2007   Cholestrol 121 ATP III CLASSIFICATION: <200     mg/dL   Desirable 409-811  mg/dL   Borderline High >=914    mg/dL   High   LDLCALC 75 Total Cholesterol/HDL:CHD Risk Coronary Heart Disease Risk Table Men   Women 1/2 Average Risk   3.4   3.3   HDL 28(L)   Trlycerides 92      Capillary Blood Glucose: Lab Results  Component Value Date   GLUCAP 224 (H) 01/21/2018   GLUCAP 214 (H) 01/21/2018   GLUCAP 252 (H) 01/21/2018   GLUCAP 186 (H) 01/20/2018   GLUCAP 92 01/20/2018     Exercise Target Goals: Exercise Program Goal: Individual exercise prescription set using results from initial 6 min walk test and THRR while considering  patient's activity barriers and safety.   Exercise Prescription Goal: Starting with aerobic activity 30 plus minutes a day, 3 days per week for initial exercise prescription. Provide home exercise prescription and guidelines that participant acknowledges understanding prior to discharge.  Activity Barriers & Risk Stratification: Activity Barriers & Cardiac Risk Stratification - 02/17/18 1415      Activity Barriers & Cardiac Risk Stratification   Activity Barriers  Deconditioning;Muscular Weakness;Shortness of Breath;History of Falls;Balance Concerns    Cardiac Risk Stratification  High       6 Minute Walk: 6 Minute Walk    Row Name 02/17/18 1413         6 Minute Walk   Phase  Initial     Distance  650 feet     Walk Time  6 minutes     # of Rest Breaks  0     MPH  1.23     METS  1.94     RPE  15     Perceived Dyspnea   13     VO2 Peak  2.11     Symptoms  Yes (comment)     Comments  3/10 back pain while walking     Resting HR  76 bpm     Resting BP  140/76     Resting Oxygen Saturation   95 %     Exercise Oxygen Saturation  during  6 min walk  90 %     Max Ex. HR  92 bpm     Max Ex. BP  162/78     2 Minute Post  BP  136/76        Oxygen Initial Assessment:   Oxygen Re-Evaluation:   Oxygen Discharge (Final Oxygen Re-Evaluation):   Initial Exercise Prescription: Initial Exercise Prescription - 02/17/18 1400      Date of Initial Exercise RX and Referring Provider   Date  02/17/18    Referring Provider  Koneswaran    Expected Discharge Date  05/20/17      Treadmill   MPH  0.8    Grade  0    Minutes  17    METs  1.61      NuStep   Level  1    SPM  65    Minutes  17    METs  1.8      Prescription Details   Frequency (times per week)  3    Duration  Progress to 30 minutes of continuous aerobic without signs/symptoms of physical distress      Intensity   THRR 40-80% of Max Heartrate  101-115-129    Ratings of Perceived Exertion  11-13    Perceived Dyspnea  0-4      Progression   Progression  Continue to progress workloads to maintain intensity without signs/symptoms of physical distress.      Resistance Training   Training Prescription  Yes    Weight  1    Reps  10-15       Perform Capillary Blood Glucose checks as needed.  Exercise Prescription Changes:   Exercise Comments:   Exercise Goals and Review:  Exercise Goals    Row Name 02/17/18 1417             Exercise Goals   Increase Physical Activity  Yes       Intervention  Provide advice, education, support and counseling about physical activity/exercise needs.;Develop an individualized exercise prescription for aerobic and resistive training based on initial evaluation findings, risk stratification, comorbidities and participant's personal goals.       Expected Outcomes  Short Term: Attend rehab on a regular basis to increase amount of physical activity.;Long Term: Add in home exercise to make exercise part of routine and to increase amount of physical activity.;Long Term: Exercising regularly at least 3-5 days a week.        Increase Strength and Stamina  Yes       Intervention  Provide advice, education, support and counseling about physical activity/exercise needs.;Develop an individualized exercise prescription for aerobic and resistive training based on initial evaluation findings, risk stratification, comorbidities and participant's personal goals.       Expected Outcomes  Short Term: Increase workloads from initial exercise prescription for resistance, speed, and METs.;Short Term: Perform resistance training exercises routinely during rehab and add in resistance training at home;Long Term: Improve cardiorespiratory fitness, muscular endurance and strength as measured by increased METs and functional capacity ( )       Able to understand and use rate of perceived exertion (RPE) scale  Yes       Intervention  Provide education and explanation on how to use RPE scale       Expected Outcomes  Short Term: Able to use RPE daily in rehab to express subjective intensity level;Long Term:  Able to use RPE to guide intensity level when exercising independently       Able to understand and use Dyspnea scale  Yes       Intervention  Provide education and explanation on how to use Dyspnea scale  Expected Outcomes  Short Term: Able to use Dyspnea scale daily in rehab to express subjective sense of shortness of breath during exertion;Long Term: Able to use Dyspnea scale to guide intensity level when exercising independently       Knowledge and understanding of Target Heart Rate Range (THRR)  Yes       Intervention  Provide education and explanation of THRR including how the numbers were predicted and where they are located for reference       Expected Outcomes  Short Term: Able to state/look up THRR;Short Term: Able to use daily as guideline for intensity in rehab;Long Term: Able to use THRR to govern intensity when exercising independently       Able to check pulse independently  Yes       Intervention  Provide education and  demonstration on how to check pulse in carotid and radial arteries.;Review the importance of being able to check your own pulse for safety during independent exercise       Expected Outcomes  Short Term: Able to explain why pulse checking is important during independent exercise;Long Term: Able to check pulse independently and accurately       Understanding of Exercise Prescription  Yes       Intervention  Provide education, explanation, and written materials on patient's individual exercise prescription       Expected Outcomes  Short Term: Able to explain program exercise prescription;Long Term: Able to explain home exercise prescription to exercise independently          Exercise Goals Re-Evaluation :    Discharge Exercise Prescription (Final Exercise Prescription Changes):   Nutrition:  Target Goals: Understanding of nutrition guidelines, daily intake of sodium 1500mg , cholesterol 200mg , calories 30% from fat and 7% or less from saturated fats, daily to have 5 or more servings of fruits and vegetables.  Biometrics: Pre Biometrics - 02/17/18 1418      Pre Biometrics   Height  5\' 9"  (1.753 m)    Weight  283 lb 15.2 oz (128.8 kg)    Waist Circumference  58.5 inches    Hip Circumference  56.5 inches    Waist to Hip Ratio  1.04 %    BMI (Calculated)  41.91    Triceps Skinfold  12 mm    % Body Fat  41.6 %    Grip Strength  19.03 kg    Flexibility  0 in    Single Leg Stand  1.52 seconds        Nutrition Therapy Plan and Nutrition Goals: Nutrition Therapy & Goals - 02/17/18 1447      Personal Nutrition Goals   Personal Goal #2  Patient is eating fairly heart healthy. Handout given on how to make better choices.     Additional Goals?  No       Nutrition Assessments: Nutrition Assessments - 02/17/18 1448      MEDFICTS Scores   Pre Score  41       Nutrition Goals Re-Evaluation:   Nutrition Goals Discharge (Final Nutrition Goals  Re-Evaluation):   Psychosocial: Target Goals: Acknowledge presence or absence of significant depression and/or stress, maximize coping skills, provide positive support system. Participant is able to verbalize types and ability to use techniques and skills needed for reducing stress and depression.  Initial Review & Psychosocial Screening: Initial Psych Review & Screening - 02/17/18 1445      Initial Review   Current issues with  None Identified  Family Dynamics   Good Support System?  Yes    Concerns  --   Would like to have a bewtter relationship with his sons.      Barriers   Psychosocial barriers to participate in program  There are no identifiable barriers or psychosocial needs.      Screening Interventions   Interventions  Encouraged to exercise    Expected Outcomes  Short Term goal: Identification and review with participant of any Quality of Life or Depression concerns found by scoring the questionnaire.;Long Term goal: The participant improves quality of Life and PHQ9 Scores as seen by post scores and/or verbalization of changes       Quality of Life Scores: Quality of Life - 02/17/18 1422      Quality of Life   Select  Quality of Life      Quality of Life Scores   Health/Function Pre  17.13 %    Socioeconomic Pre  19.07 %    Psych/Spiritual Pre  19.36 %    Family Pre  20.4 %    GLOBAL Pre  18.47 %      Scores of 19 and below usually indicate a poorer quality of life in these areas.  A difference of  2-3 points is a clinically meaningful difference.  A difference of 2-3 points in the total score of the Quality of Life Index has been associated with significant improvement in overall quality of life, self-image, physical symptoms, and general health in studies assessing change in quality of life.  PHQ-9: Recent Review Flowsheet Data    Depression screen [facility] 2/9 02/17/2018   Decreased Interest 0   Down, Depressed, Hopeless 0   PHQ - 2 Score 0   Altered  sleeping 1   Tired, decreased energy 2   Change in appetite 1   Feeling bad or failure about yourself  1   Trouble concentrating 1   Moving slowly or fidgety/restless 0   Suicidal thoughts 0   PHQ-9 Score 6   Difficult doing work/chores Somewhat difficult     Interpretation of Total Score  Total Score Depression Severity:  1-4 = Minimal depression, 5-9 = Mild depression, 10-14 = Moderate depression, 15-19 = Moderately severe depression, 20-27 = Severe depression   Psychosocial Evaluation and Intervention: Psychosocial Evaluation - 02/17/18 1446      Psychosocial Evaluation & Interventions   Interventions  Encouraged to exercise with the program and follow exercise prescription    Continue Psychosocial Services   No Follow up required       Psychosocial Re-Evaluation:   Psychosocial Discharge (Final Psychosocial Re-Evaluation):   Vocational Rehabilitation: Provide vocational rehab assistance to qualifying candidates.   Vocational Rehab Evaluation & Intervention: Vocational Rehab - 02/17/18 1449      Initial Vocational Rehab Evaluation & Intervention   Assessment shows need for Vocational Rehabilitation  No       Education: Education Goals: Education classes will be provided on a weekly basis, covering required topics. Participant will state understanding/return demonstration of topics presented.  Learning Barriers/Preferences: Learning Barriers/Preferences - 02/17/18 1448      Learning Barriers/Preferences   Learning Barriers  None    Learning Preferences  Group Instruction;Individual Instruction;Skilled Demonstration       Education Topics: Hypertension, Hypertension Reduction -Define heart disease and high blood pressure. Discus how high blood pressure affects the body and ways to reduce high blood pressure.   Exercise and Your Heart -Discuss why it is important to exercise, the [facility]  principles of exercise, normal and abnormal responses to exercise, and how  to exercise safely.   Angina -Discuss definition of angina, causes of angina, treatment of angina, and how to decrease risk of having angina.   Cardiac Medications -Review what the following cardiac medications are used for, how they affect the body, and side effects that may occur when taking the medications.  Medications include Aspirin, Beta blockers, calcium channel blockers, ACE Inhibitors, angiotensin receptor blockers, diuretics, digoxin, and antihyperlipidemics.   Congestive Heart Failure -Discuss the definition of CHF, how to live with CHF, the signs and symptoms of CHF, and how keep track of weight and sodium intake.   Heart Disease and Intimacy -Discus the effect sexual activity has on the heart, how changes occur during intimacy as we age, and safety during sexual activity.   Smoking Cessation / COPD -Discuss different methods to quit smoking, the health benefits of quitting smoking, and the definition of COPD.   Nutrition I: Fats -Discuss the types of cholesterol, what cholesterol does to the heart, and how cholesterol levels can be controlled.   Nutrition II: Labels -Discuss the different components of food labels and how to read food label   Heart Parts/Heart Disease and PAD -Discuss the anatomy of the heart, the pathway of blood circulation through the heart, and these are affected by heart disease.   Stress I: Signs and Symptoms -Discuss the causes of stress, how stress may lead to anxiety and depression, and ways to limit stress.   Stress II: Relaxation -Discuss different types of relaxation techniques to limit stress.   Warning Signs of Stroke / TIA -Discuss definition of a stroke, what the signs and symptoms are of a stroke, and how to identify when someone is having stroke.   Knowledge Questionnaire Score: Knowledge Questionnaire Score - 02/17/18 1448      Knowledge Questionnaire Score   Pre Score  21/24       Core Components/Risk  Factors/Patient Goals at Admission: Personal Goals and Risk Factors at Admission - 02/17/18 1449      Core Components/Risk Factors/Patient Goals on Admission    Weight Management  Weight Loss    Personal Goal Other  Yes    Personal Goal  To feel better, be able to do more w/o getting tired.     Intervention  Attend CR 3 x week and supplement 2 x week with at home exercise    Expected Outcomes  Reach personal goals.        Core Components/Risk Factors/Patient Goals Review:    Core Components/Risk Factors/Patient Goals at Discharge (Final Review):    ITP Comments: ITP Comments    Row Name 02/17/18 1319           ITP Comments  Patient is coming to CR per Dr. Reece Leader request due to CHF. Had to delay starting due to needing pacemaker placement. He is eager to get started. He also inform us of his balance issues.           Comments: Patient arrived for 1st visit/orientation/education at 1230. Patient was referred to CR by Dr. Sharrell Ku due to Chronic Systolic CHF (I50.22). During orientation advised patient on arrival and appointment times what to wear, what to do before, during and after exercise. Reviewed attendance and class policy. Talked about inclement weather and class consultation policy. Pt is scheduled to return Cardiac Rehab on 02/20/18 at 11:00. Pt was advised to come to class 15 minutes before class starts. Patient was  also given instructions on meeting with the dietician and attending the Family Structure classes. Discussed RPE/Dpysnea scales. Discussed initial THR and how to find their radial and/or carotid pulse. Discussed the initial exercise prescription and how this effects their progress. Pt is eager to get started. Patient participated in warm up stretches followed by light weights and resistance bands. Patient was able to complete 6 minute walk test. Patient c/o back pain 3/10 while walking. Pain subsided after 2 minute rest. Pain was completely gone at the end of  orientation. Patient was measured for the equipment. Discussed equipment safety with patient. Took patient pre-anthropometric measurements. Patient finished visit at 1500.

## 2018-02-20 ENCOUNTER — Encounter (HOSPITAL_COMMUNITY)
Admission: RE | Admit: 2018-02-20 | Discharge: 2018-02-20 | Disposition: A | Discharge status: Home / Self Care | Payer: Medicare Other | Source: Ambulatory Visit | Attending: Internal Medicine | Admitting: Internal Medicine

## 2018-02-20 DIAGNOSIS — I5022 Chronic systolic (congestive) heart failure: Secondary | ICD-10-CM

## 2018-02-20 NOTE — Telephone Encounter (Signed)
**Note De-Identified  Obfuscation** The pt returned his [facility] pt asst application for Entresto to this office and Dr Ladona Ridgel has signed it.  I have faxed it to [facility].

## 2018-02-20 NOTE — Progress Notes (Signed)
Daily Session Note  Patient Details  Name: Noah [name] MRN: [id number] Date of Birth: October 23, 1940 Referring Provider:     [place] from 02/17/2018 in [place]  Referring Provider  Bronson Ing      Encounter Date: 02/20/2018  Check In: Session Check In - 02/20/18 1100      Check-In   Supervising physician immediately available to respond to emergencies  See telemetry face sheet for immediately available MD    Location  [facility]    Staff Present  Russella Dar, MS, EP, [facility], Exercise Physiologist;Amanda Ballard, Exercise Physiologist;Rolland Steinert Wynetta Emery, RN, BSN    Medication changes reported      No    Fall or balance concerns reported     Yes    Comments  Patient has fallen 4 times in the past 12 months    Resistance Training Performed  Yes    VAD Patient?  No    PAD/SET Patient?  No      Pain Assessment   Currently in Pain?  No/denies    Pain Score  0-No pain    Multiple Pain Sites  No       Capillary Blood Glucose: No results found for this or any previous visit (from the past 24 hour(s)).    Social History   Tobacco Use  Smoking Status Former Smoker  . Packs/day: 1.00  . Years: 30.00  . Pack years: 30.00  . Last attempt to quit: 09/01/2000  . Years since quitting: 17.4  Smokeless Tobacco Never Used    Goals Met:  Independence with exercise equipment Exercise tolerated well No report of cardiac concerns or symptoms Strength training completed today  Goals Unmet:  Not Applicable  Comments: Pt able to follow exercise prescription today without complaint.  Will continue to monitor for progression. Check out 1200.   Dr. Kate Sable is Medical Director for [facility] Cardiac and Pulmonary Rehab.

## 2018-02-20 NOTE — Telephone Encounter (Signed)
Pt was calling to check and see if you heard from the [facility] about his application. He also wants you to call him back at [phone]

## 2018-02-20 NOTE — Telephone Encounter (Signed)
**Note De-Identified  Obfuscation** The pt is advised that I faxed his application to [facility] this morning. I did give him the phone number to reach [facility] to check the progress of his application.

## 2018-02-20 NOTE — Progress Notes (Signed)
Cardiac Individual Treatment Plan  Patient Details  Name: Noah [name] MRN: [id number] Date of Birth: 05-29-1940 Referring Provider:     CARDIAC REHAB PHASE II ORIENTATION from 02/17/2018 in [place] [facility]  Referring Provider  [name]      Initial Encounter Date:    CARDIAC REHAB PHASE II ORIENTATION from 02/17/2018 in [facility]  Date  02/17/18      Visit Diagnosis: CHF (congestive heart failure), NYHA class I, chronic, systolic (HCC)  Patient's Home Medications on Admission:  Current Outpatient Medications:  .  acetaminophen (TYLENOL) 650 MG CR tablet, Take 1,300 mg by mouth at bedtime., Disp: , Rfl:  .  aspirin EC 81 MG tablet, Take 1 tablet (81 mg total) by mouth daily., Disp: 90 tablet, Rfl: 3 .  Coenzyme Q10 (COQ10) 100 MG CAPS, Take 100 mg by mouth every evening. , Disp: , Rfl:  .  cyclobenzaprine (FLEXERIL) 10 MG tablet, Take 10 mg by mouth 3 (three) times daily as needed for spasms., Disp: , Rfl: 3 .  finasteride (PROSCAR) 5 MG tablet, Take 5 mg by mouth every evening. , Disp: , Rfl: 4 .  FLUoxetine (PROZAC) 20 MG capsule, Take 20 mg by mouth every evening., Disp: , Rfl:  .  furosemide (LASIX) 40 MG tablet, Take 2 tabs (80mg ) by mouth every morning & 1 tab (40mg ) every evening (Patient taking differently: Take 80 mg by mouth daily. ), Disp: 30 tablet, Rfl:  .  gabapentin (NEURONTIN) 100 MG capsule, Take 100 mg by mouth 2 (two) times daily. , Disp: , Rfl:  .  glipiZIDE (GLUCOTROL XL) 10 MG 24 hr tablet, Take 10 mg by mouth 2 (two) times daily., Disp: , Rfl:  .  glucose blood (ONE TOUCH ULTRA TEST) test strip, Check your sugars twice a day, Disp: 100 each, Rfl: 12 .  Insulin Glargine-Lixisenatide (SOLIQUA) 100-33 UNT-MCG/ML SOPN, Inject 40 Units into the skin every evening., Disp: , Rfl:  .  Insulin Pen Needle (NOVOFINE) 30G X 8 MM MISC, Inject 10 each into the skin as needed., Disp: 90 each, Rfl: 3 .  isosorbide mononitrate (IMDUR)  30 MG 24 hr tablet, Take 30 mg by mouth every evening. , Disp: , Rfl: 3 .  metFORMIN (GLUCOPHAGE-XR) 500 MG 24 hr tablet, Take 1,500 mg by mouth daily., Disp: , Rfl:  .  metoprolol (TOPROL-XL) 200 MG 24 hr tablet, Take 100 mg by mouth 2 (two) times daily., Disp: , Rfl:  .  Multiple Vitamin (MULTIVITAMIN) capsule, Take 1 capsule by mouth daily. , Disp: , Rfl:  .  Omega-3 Fatty Acids (FISH OIL) 1000 MG CAPS, Take 1,000 mg by mouth 2 (two) times daily. , Disp: , Rfl:  .  omeprazole (PRILOSEC) 20 MG capsule, Take 20 mg by mouth daily., Disp: , Rfl:  .  ranitidine (ZANTAC) 150 MG tablet, Take 150 mg by mouth at bedtime., Disp: , Rfl:  .  rivaroxaban (XARELTO) 20 MG TABS tablet, Take 1 tablet (20 mg total) by mouth daily with supper., Disp: 30 tablet, Rfl: 3 .  sacubitril-valsartan (ENTRESTO) 49-51 MG, Take 1 tablet by mouth 2 (two) times daily., Disp: 60 tablet, Rfl: 6 .  simvastatin (ZOCOR) 80 MG tablet, TAKE 80 MG (1 TABLET) DAILY. (Patient taking differently: Take 80 mg by mouth every evening. TAKE 80 MG (1 TABLET) DAILY.), Disp: 90 tablet, Rfl: 3 .  Tamsulosin HCl (FLOMAX) 0.4 MG CAPS, Take 0.8 mg by mouth every evening., Disp: , Rfl:  .  zolpidem (AMBIEN) 10 MG tablet, Take 10 mg by mouth at bedtime. , Disp: , Rfl:   Past Medical History: Past Medical History:  Diagnosis Date  . CAD S/P percutaneous coronary angioplasty    remote PCIs in 1990's- last CFX PCI 2008  . Chronic kidney disease, stage III (moderate) (HCC)   . Diabetes mellitus with complication (HCC)    with hyperglycemia and diabetic autonomic poly neuropathy  . Gastro-esophageal reflux disease with esophagitis   . Hyperlipidemia   . Hypertension   . Ischemic cardiomyopathy 01/20/2018   St Jude ICD   . Morbid obesity (HCC)   . Obstructive sleep apnea    on CPAP  . Polyneuropathy   . Primary insomnia     Tobacco Use: Social History   Tobacco Use  Smoking Status Former Smoker  . Packs/day: 1.00  . Years: 30.00  .  Pack years: 30.00  . Last attempt to quit: 09/01/2000  . Years since quitting: 17.4  Smokeless Tobacco Never Used    Labs: Recent Review Advice worker    Labs for ITP Cardiac and Pulmonary Rehab 09/09/2007   Cholestrol 121 ATP III CLASSIFICATION: <200     mg/dL   Desirable 540-981  mg/dL   Borderline High >=191    mg/dL   High   LDLCALC 75 Total Cholesterol/HDL:CHD Risk Coronary Heart Disease Risk Table Men   Women 1/2 Average Risk   3.4   3.3   HDL 28(L)   Trlycerides 92      Capillary Blood Glucose: Lab Results  Component Value Date   GLUCAP 224 (H) 01/21/2018   GLUCAP 214 (H) 01/21/2018   GLUCAP 252 (H) 01/21/2018   GLUCAP 186 (H) 01/20/2018   GLUCAP 92 01/20/2018     Exercise Target Goals: Exercise Program Goal: Individual exercise prescription set using results from initial 6 min walk test and THRR while considering  patient's activity barriers and safety.   Exercise Prescription Goal: Starting with aerobic activity 30 plus minutes a day, 3 days per week for initial exercise prescription. Provide home exercise prescription and guidelines that participant acknowledges understanding prior to discharge.  Activity Barriers & Risk Stratification: Activity Barriers & Cardiac Risk Stratification - 02/17/18 1415      Activity Barriers & Cardiac Risk Stratification   Activity Barriers  Deconditioning;Muscular Weakness;Shortness of Breath;History of Falls;Balance Concerns    Cardiac Risk Stratification  High       6 Minute Walk: 6 Minute Walk    Row Name 02/17/18 1413         6 Minute Walk   Phase  Initial     Distance  650 feet     Walk Time  6 minutes     # of Rest Breaks  0     MPH  1.23     METS  1.94     RPE  15     Perceived Dyspnea   13     VO2 Peak  2.11     Symptoms  Yes (comment)     Comments  3/10 back pain while walking     Resting HR  76 bpm     Resting BP  140/76     Resting Oxygen Saturation   95 %     Exercise Oxygen Saturation  during  6 min walk  90 %     Max Ex. HR  92 bpm     Max Ex. BP  162/78     2 Minute Post  BP  136/76        Oxygen Initial Assessment:   Oxygen Re-Evaluation:   Oxygen Discharge (Final Oxygen Re-Evaluation):   Initial Exercise Prescription: Initial Exercise Prescription - 02/17/18 1400      Date of Initial Exercise RX and Referring Provider   Date  02/17/18    Referring Provider  Koneswaran    Expected Discharge Date  05/20/17      Treadmill   MPH  0.8    Grade  0    Minutes  17    METs  1.61      NuStep   Level  1    SPM  65    Minutes  17    METs  1.8      Prescription Details   Frequency (times per week)  3    Duration  Progress to 30 minutes of continuous aerobic without signs/symptoms of physical distress      Intensity   THRR 40-80% of Max Heartrate  101-115-129    Ratings of Perceived Exertion  11-13    Perceived Dyspnea  0-4      Progression   Progression  Continue to progress workloads to maintain intensity without signs/symptoms of physical distress.      Resistance Training   Training Prescription  Yes    Weight  1    Reps  10-15       Perform Capillary Blood Glucose checks as needed.  Exercise Prescription Changes:   Exercise Comments:   Exercise Goals and Review:  Exercise Goals    Row Name 02/17/18 1417             Exercise Goals   Increase Physical Activity  Yes       Intervention  Provide advice, education, support and counseling about physical activity/exercise needs.;Develop an individualized exercise prescription for aerobic and resistive training based on initial evaluation findings, risk stratification, comorbidities and participant's personal goals.       Expected Outcomes  Short Term: Attend rehab on a regular basis to increase amount of physical activity.;Long Term: Add in home exercise to make exercise part of routine and to increase amount of physical activity.;Long Term: Exercising regularly at least 3-5 days a week.        Increase Strength and Stamina  Yes       Intervention  Provide advice, education, support and counseling about physical activity/exercise needs.;Develop an individualized exercise prescription for aerobic and resistive training based on initial evaluation findings, risk stratification, comorbidities and participant's personal goals.       Expected Outcomes  Short Term: Increase workloads from initial exercise prescription for resistance, speed, and METs.;Short Term: Perform resistance training exercises routinely during rehab and add in resistance training at home;Long Term: Improve cardiorespiratory fitness, muscular endurance and strength as measured by increased METs and functional capacity ( )       Able to understand and use rate of perceived exertion (RPE) scale  Yes       Intervention  Provide education and explanation on how to use RPE scale       Expected Outcomes  Short Term: Able to use RPE daily in rehab to express subjective intensity level;Long Term:  Able to use RPE to guide intensity level when exercising independently       Able to understand and use Dyspnea scale  Yes       Intervention  Provide education and explanation on how to use Dyspnea scale  Expected Outcomes  Short Term: Able to use Dyspnea scale daily in rehab to express subjective sense of shortness of breath during exertion;Long Term: Able to use Dyspnea scale to guide intensity level when exercising independently       Knowledge and understanding of Target Heart Rate Range (THRR)  Yes       Intervention  Provide education and explanation of THRR including how the numbers were predicted and where they are located for reference       Expected Outcomes  Short Term: Able to state/look up THRR;Short Term: Able to use daily as guideline for intensity in rehab;Long Term: Able to use THRR to govern intensity when exercising independently       Able to check pulse independently  Yes       Intervention  Provide education and  demonstration on how to check pulse in carotid and radial arteries.;Review the importance of being able to check your own pulse for safety during independent exercise       Expected Outcomes  Short Term: Able to explain why pulse checking is important during independent exercise;Long Term: Able to check pulse independently and accurately       Understanding of Exercise Prescription  Yes       Intervention  Provide education, explanation, and written materials on patient's individual exercise prescription       Expected Outcomes  Short Term: Able to explain program exercise prescription;Long Term: Able to explain home exercise prescription to exercise independently          Exercise Goals Re-Evaluation :    Discharge Exercise Prescription (Final Exercise Prescription Changes):   Nutrition:  Target Goals: Understanding of nutrition guidelines, daily intake of sodium 1500mg , cholesterol 200mg , calories 30% from fat and 7% or less from saturated fats, daily to have 5 or more servings of fruits and vegetables.  Biometrics: Pre Biometrics - 02/17/18 1418      Pre Biometrics   Height  5\' 9"  (1.753 m)    Weight  128.8 kg    Waist Circumference  58.5 inches    Hip Circumference  56.5 inches    Waist to Hip Ratio  1.04 %    BMI (Calculated)  41.91    Triceps Skinfold  12 mm    % Body Fat  41.6 %    Grip Strength  19.03 kg    Flexibility  0 in    Single Leg Stand  1.52 seconds        Nutrition Therapy Plan and Nutrition Goals: Nutrition Therapy & Goals - 02/17/18 1447      Personal Nutrition Goals   Personal Goal #2  Patient is eating fairly heart healthy. Handout given on how to make better choices.     Additional Goals?  No       Nutrition Assessments: Nutrition Assessments - 02/17/18 1448      MEDFICTS Scores   Pre Score  41       Nutrition Goals Re-Evaluation:   Nutrition Goals Discharge (Final Nutrition Goals Re-Evaluation):   Psychosocial: Target Goals:  Acknowledge presence or absence of significant depression and/or stress, maximize coping skills, provide positive support system. Participant is able to verbalize types and ability to use techniques and skills needed for reducing stress and depression.  Initial Review & Psychosocial Screening: Initial Psych Review & Screening - 02/17/18 1445      Initial Review   Current issues with  None Identified      Family Dynamics  Good Support System?  Yes    Concerns  --   Would like to have a bewtter relationship with his sons.      Barriers   Psychosocial barriers to participate in program  There are no identifiable barriers or psychosocial needs.      Screening Interventions   Interventions  Encouraged to exercise    Expected Outcomes  Short Term goal: Identification and review with participant of any Quality of Life or Depression concerns found by scoring the questionnaire.;Long Term goal: The participant improves quality of Life and PHQ9 Scores as seen by post scores and/or verbalization of changes       Quality of Life Scores: Quality of Life - 02/17/18 1422      Quality of Life   Select  Quality of Life      Quality of Life Scores   Health/Function Pre  17.13 %    Socioeconomic Pre  19.07 %    Psych/Spiritual Pre  19.36 %    Family Pre  20.4 %    GLOBAL Pre  18.47 %      Scores of 19 and below usually indicate a poorer quality of life in these areas.  A difference of  2-3 points is a clinically meaningful difference.  A difference of 2-3 points in the total score of the Quality of Life Index has been associated with significant improvement in overall quality of life, self-image, physical symptoms, and general health in studies assessing change in quality of life.  PHQ-9: Recent Review Flowsheet Data    Depression screen [facility] 2/9 02/17/2018   Decreased Interest 0   Down, Depressed, Hopeless 0   PHQ - 2 Score 0   Altered sleeping 1   Tired, decreased energy 2   Change in  appetite 1   Feeling bad or failure about yourself  1   Trouble concentrating 1   Moving slowly or fidgety/restless 0   Suicidal thoughts 0   PHQ-9 Score 6   Difficult doing work/chores Somewhat difficult     Interpretation of Total Score  Total Score Depression Severity:  1-4 = Minimal depression, 5-9 = Mild depression, 10-14 = Moderate depression, 15-19 = Moderately severe depression, 20-27 = Severe depression   Psychosocial Evaluation and Intervention: Psychosocial Evaluation - 02/17/18 1446      Psychosocial Evaluation & Interventions   Interventions  Encouraged to exercise with the program and follow exercise prescription    Continue Psychosocial Services   No Follow up required       Psychosocial Re-Evaluation:   Psychosocial Discharge (Final Psychosocial Re-Evaluation):   Vocational Rehabilitation: Provide vocational rehab assistance to qualifying candidates.   Vocational Rehab Evaluation & Intervention: Vocational Rehab - 02/17/18 1449      Initial Vocational Rehab Evaluation & Intervention   Assessment shows need for Vocational Rehabilitation  No       Education: Education Goals: Education classes will be provided on a weekly basis, covering required topics. Participant will state understanding/return demonstration of topics presented.  Learning Barriers/Preferences: Learning Barriers/Preferences - 02/17/18 1448      Learning Barriers/Preferences   Learning Barriers  None    Learning Preferences  Group Instruction;Individual Instruction;Skilled Demonstration       Education Topics: Hypertension, Hypertension Reduction -Define heart disease and high blood pressure. Discus how high blood pressure affects the body and ways to reduce high blood pressure.   Exercise and Your Heart -Discuss why it is important to exercise, the FITT principles of exercise, normal  and abnormal responses to exercise, and how to exercise safely.   Angina -Discuss definition  of angina, causes of angina, treatment of angina, and how to decrease risk of having angina.   Cardiac Medications -Review what the following cardiac medications are used for, how they affect the body, and side effects that may occur when taking the medications.  Medications include Aspirin, Beta blockers, calcium channel blockers, ACE Inhibitors, angiotensin receptor blockers, diuretics, digoxin, and antihyperlipidemics.   Congestive Heart Failure -Discuss the definition of CHF, how to live with CHF, the signs and symptoms of CHF, and how keep track of weight and sodium intake.   Heart Disease and Intimacy -Discus the effect sexual activity has on the heart, how changes occur during intimacy as we age, and safety during sexual activity.   Smoking Cessation / COPD -Discuss different methods to quit smoking, the health benefits of quitting smoking, and the definition of COPD.   Nutrition I: Fats -Discuss the types of cholesterol, what cholesterol does to the heart, and how cholesterol levels can be controlled.   Nutrition II: Labels -Discuss the different components of food labels and how to read food label   Heart Parts/Heart Disease and PAD -Discuss the anatomy of the heart, the pathway of blood circulation through the heart, and these are affected by heart disease.   Stress I: Signs and Symptoms -Discuss the causes of stress, how stress may lead to anxiety and depression, and ways to limit stress.   Stress II: Relaxation -Discuss different types of relaxation techniques to limit stress.   Warning Signs of Stroke / TIA -Discuss definition of a stroke, what the signs and symptoms are of a stroke, and how to identify when someone is having stroke.   Knowledge Questionnaire Score: Knowledge Questionnaire Score - 02/17/18 1448      Knowledge Questionnaire Score   Pre Score  21/24       Core Components/Risk Factors/Patient Goals at Admission: Personal Goals and Risk  Factors at Admission - 02/17/18 1449      Core Components/Risk Factors/Patient Goals on Admission    Weight Management  Weight Loss    Personal Goal Other  Yes    Personal Goal  To feel better, be able to do more w/o getting tired.     Intervention  Attend CR 3 x week and supplement 2 x week with at home exercise    Expected Outcomes  Reach personal goals.        Core Components/Risk Factors/Patient Goals Review:    Core Components/Risk Factors/Patient Goals at Discharge (Final Review):    ITP Comments: ITP Comments    Row Name 02/17/18 1319 02/20/18 1506         ITP Comments  Patient is coming to CR per Dr. Reece Leader request due to CHF. Had to delay starting due to needing pacemaker placement. He is eager to get started. He also inform us of his balance issues.   Patient new to the program. He has completed 2 sessions. Will continue to monitor for progress.          Comments: ITP REVIEW Patient new to the program. He has completed 2 sessions. Will continue to monitor for progress.

## 2018-02-21 ENCOUNTER — Ambulatory Visit (INDEPENDENT_AMBULATORY_CARE_PROVIDER_SITE_OTHER): Payer: Medicare Other | Admitting: *Deleted

## 2018-02-21 DIAGNOSIS — I4891 Unspecified atrial fibrillation: Secondary | ICD-10-CM | POA: Diagnosis not present

## 2018-02-21 DIAGNOSIS — I255 Ischemic cardiomyopathy: Secondary | ICD-10-CM

## 2018-02-21 DIAGNOSIS — Z5181 Encounter for therapeutic drug level monitoring: Secondary | ICD-10-CM | POA: Diagnosis not present

## 2018-02-21 DIAGNOSIS — I4821 Permanent atrial fibrillation: Secondary | ICD-10-CM | POA: Insufficient documentation

## 2018-02-21 LAB — POCT INR: INR: 1.5 — AB (ref 2.0–3.0)

## 2018-02-21 MED ORDER — WARFARIN SODIUM 5 MG PO TABS: 5.0000 mg | ORAL_TABLET | Freq: Every day | ORAL | 1 refills | Status: DC

## 2018-02-21 NOTE — Patient Instructions (Signed)
Take coumadin 1 tablet daily (5mg ).  Start today Take last dose of Xarelto on Thursday Recheck in 1 week

## 2018-02-22 ENCOUNTER — Encounter (HOSPITAL_COMMUNITY)
Admission: RE | Admit: 2018-02-22 | Discharge: 2018-02-22 | Disposition: A | Discharge status: Home / Self Care | Payer: Medicare Other | Source: Ambulatory Visit | Attending: Internal Medicine | Admitting: Internal Medicine

## 2018-02-22 DIAGNOSIS — I5022 Chronic systolic (congestive) heart failure: Secondary | ICD-10-CM | POA: Diagnosis not present

## 2018-02-22 NOTE — Progress Notes (Signed)
Daily Session Note  Patient Details  Name: Noah [name] MRN: [id number] Date of Birth: [birth year] Referring Provider:     [place] from 02/17/2018 in [place]  Referring Provider  Bronson Ing      Encounter Date: 02/22/2018  Check In: Session Check In - 02/22/18 1100      Check-In   Supervising physician immediately available to respond to emergencies  See telemetry face sheet for immediately available MD    Location  [facility]    Staff Present  Russella Dar, MS, EP, [facility], Exercise Physiologist;Amanda Ballard, Exercise Physiologist;Aishani Kalis Wynetta Emery, RN, BSN    Medication changes reported      No    Fall or balance concerns reported     Yes    Comments  Patient has fallen 4 times in the past 12 months    Resistance Training Performed  Yes    VAD Patient?  No    PAD/SET Patient?  No      Pain Assessment   Currently in Pain?  No/denies    Pain Score  0-No pain    Multiple Pain Sites  No       Capillary Blood Glucose: No results found for this or any previous visit (from the past 24 hour(s)).    Social History   Tobacco Use  Smoking Status Former Smoker  . Packs/day: 1.00  . Years: 30.00  . Pack years: 30.00  . Last attempt to quit: 09/01/2000  . Years since quitting: 17.4  Smokeless Tobacco Never Used    Goals Met:  Independence with exercise equipment Exercise tolerated well No report of cardiac concerns or symptoms Strength training completed today  Goals Unmet:  Not Applicable  Comments: Pt able to follow exercise prescription today without complaint.  Will continue to monitor for progression. Check out 1200.   Dr. Kate Sable is Medical Director for [facility] Cardiac and Pulmonary Rehab.

## 2018-02-24 ENCOUNTER — Encounter (HOSPITAL_COMMUNITY)
Admission: RE | Admit: 2018-02-24 | Discharge: 2018-02-24 | Disposition: A | Discharge status: Home / Self Care | Payer: Medicare Other | Source: Ambulatory Visit | Attending: Internal Medicine | Admitting: Internal Medicine

## 2018-02-24 DIAGNOSIS — I5022 Chronic systolic (congestive) heart failure: Secondary | ICD-10-CM

## 2018-02-24 NOTE — Progress Notes (Signed)
Daily Session Note  Patient Details  Name: Noah [name] MRN: [id number] Date of Birth: 08/10/1940 Referring Provider:     [place] from 02/17/2018 in [place]  Referring Provider  Bronson Ing      Encounter Date: 02/24/2018  Check In: Session Check In - 02/24/18 1100      Check-In   Supervising physician immediately available to respond to emergencies  See telemetry face sheet for immediately available MD    Location  [facility]    Staff Present  Russella Dar, MS, EP, [facility], Exercise Physiologist;Dedra Matsuo, Exercise Physiologist;Debra Wynetta Emery, RN, BSN    Medication changes reported      No    Fall or balance concerns reported     Yes    Comments  Patient has fallen 4 times in the past 12 months    Warm-up and Cool-down  Performed as group-led instruction    Resistance Training Performed  Yes    VAD Patient?  No    PAD/SET Patient?  No      Pain Assessment   Currently in Pain?  No/denies    Pain Score  0-No pain    Multiple Pain Sites  No       Capillary Blood Glucose: No results found for this or any previous visit (from the past 24 hour(s)).    Social History   Tobacco Use  Smoking Status Former Smoker  . Packs/day: 1.00  . Years: 30.00  . Pack years: 30.00  . Last attempt to quit: 09/01/2000  . Years since quitting: 17.4  Smokeless Tobacco Never Used    Goals Met:  Independence with exercise equipment Exercise tolerated well No report of cardiac concerns or symptoms Strength training completed today  Goals Unmet:  Not Applicable  Comments: Pt able to follow exercise prescription today without complaint.  Will continue to monitor for progression. Check out 12:00.   Dr. Kate Sable is Medical Director for [facility] Cardiac and Pulmonary Rehab.

## 2018-02-24 NOTE — Telephone Encounter (Signed)
**Note De-Identified  Obfuscation** Letter received  fax from [facility] stating that they have denied pt asst for [employer] to the pt. Reason: "A benefit investigation has been conducted for your pt and coverage has been confirmed without a prior authorization requirement".   "The letter also states that they have notified the pt and he requested to be referred to the [employer] pt asst foundation for financial assistance for [employer]. The [employer] pt asst foundation will contact your pt regarding enrollment and eligibility".   Will forward this note to the [place] office as FYI as the pt is sees Dr Ladona Ridgel there.

## 2018-02-27 ENCOUNTER — Encounter (HOSPITAL_COMMUNITY): Payer: Medicare Other

## 2018-02-28 ENCOUNTER — Ambulatory Visit (INDEPENDENT_AMBULATORY_CARE_PROVIDER_SITE_OTHER): Payer: Medicare Other | Admitting: *Deleted

## 2018-02-28 DIAGNOSIS — I255 Ischemic cardiomyopathy: Secondary | ICD-10-CM

## 2018-02-28 DIAGNOSIS — I4891 Unspecified atrial fibrillation: Secondary | ICD-10-CM

## 2018-02-28 DIAGNOSIS — Z5181 Encounter for therapeutic drug level monitoring: Secondary | ICD-10-CM

## 2018-02-28 LAB — POCT INR: INR: 3.3 — AB (ref 2.0–3.0)

## 2018-02-28 NOTE — Patient Instructions (Signed)
Took coumadin this morning.  Has had 5 days of coumadin 5mg  daily Decrease coumadin to 1 tablet daily (5mg ) except 1/2 tablet (2.5mg ) on Sundays and Wednesdays Recheck in 1 week

## 2018-03-01 ENCOUNTER — Encounter (HOSPITAL_COMMUNITY)
Admission: RE | Admit: 2018-03-01 | Discharge: 2018-03-01 | Disposition: A | Discharge status: Home / Self Care | Payer: Medicare Other | Source: Ambulatory Visit | Attending: Internal Medicine | Admitting: Internal Medicine

## 2018-03-01 DIAGNOSIS — I5022 Chronic systolic (congestive) heart failure: Secondary | ICD-10-CM

## 2018-03-01 NOTE — Progress Notes (Signed)
Daily Session Note  Patient Details  Name: Noah [name] MRN: [id number] Date of Birth: 1941/01/16 Referring Provider:     [place] from 02/17/2018 in [place]  Referring Provider  Bronson Ing      Encounter Date: 03/01/2018  Check In: Session Check In - 03/01/18 1100      Check-In   Supervising physician immediately available to respond to emergencies  See telemetry face sheet for immediately available MD    Location  [facility]    Staff Present  Russella Dar, MS, EP, [facility], Exercise Physiologist;Amanda Ballard, Exercise Physiologist;Tessia Kassin Wynetta Emery, RN, BSN    Medication changes reported      No    Fall or balance concerns reported     Yes    Comments  Patient has fallen 4 times in the past 12 months    Warm-up and Cool-down  Performed as group-led instruction    Resistance Training Performed  Yes    VAD Patient?  No    PAD/SET Patient?  No      Pain Assessment   Currently in Pain?  No/denies    Pain Score  0-No pain    Multiple Pain Sites  No       Capillary Blood Glucose: Results for orders placed or performed in visit on 02/28/18 (from the past 24 hour(s))  POCT INR     Status: Abnormal   Collection Time: 02/28/18  1:24 PM  Result Value Ref Range   INR 3.3 (A) 2.0 - 3.0      Social History   Tobacco Use  Smoking Status Former Smoker  . Packs/day: 1.00  . Years: 30.00  . Pack years: 30.00  . Last attempt to quit: 09/01/2000  . Years since quitting: 17.5  Smokeless Tobacco Never Used    Goals Met:  Independence with exercise equipment Exercise tolerated well No report of cardiac concerns or symptoms Strength training completed today  Goals Unmet:  Not Applicable  Comments: Pt able to follow exercise prescription today without complaint.  Will continue to monitor for progression. Check out 1200.   Dr. Kate Sable is Medical Director for [facility] Cardiac and Pulmonary Rehab.

## 2018-03-02 ENCOUNTER — Encounter (HOSPITAL_COMMUNITY): Payer: Medicare Other

## 2018-03-03 ENCOUNTER — Encounter (HOSPITAL_COMMUNITY): Payer: Medicare Other

## 2018-03-06 ENCOUNTER — Encounter (HOSPITAL_COMMUNITY)
Admission: RE | Admit: 2018-03-06 | Discharge: 2018-03-06 | Disposition: A | Discharge status: Home / Self Care | Payer: Medicare Other | Source: Ambulatory Visit | Attending: Internal Medicine | Admitting: Internal Medicine

## 2018-03-06 DIAGNOSIS — I5022 Chronic systolic (congestive) heart failure: Secondary | ICD-10-CM

## 2018-03-06 NOTE — Progress Notes (Signed)
Daily Session Note  Patient Details  Name: Noah [name] MRN: [id number] Date of Birth: 06/02/1940 Referring Provider:     CARDIAC REHAB PHASE II ORIENTATION from 02/17/2018 in [place] [facility]  Referring Provider  K[place]waran      Encounter Date: 03/06/2018  Check In: Session Check In - 03/06/18 1100      Check-In   Supervising physician immediately available to respond to emergencies  See telemetry face sheet for immediately available MD    Staff Present  Diane Coad, MS, EP, [facility], Exercise Physiologist;Amanda Ballard, Exercise Physiologist; , RN, BSN    Medication changes reported      No    Fall or balance concerns reported     Yes    Comments  Patient has fallen 4 times in the past 12 months    Warm-up and Cool-down  Performed as group-led instruction    Resistance Training Performed  Yes    VAD Patient?  No    PAD/SET Patient?  No      Pain Assessment   Currently in Pain?  No/denies    Pain Score  0-No pain    Multiple Pain Sites  No       Capillary Blood Glucose: No results found for this or any previous visit (from the past 24 hour(s)).    Social History   Tobacco Use  Smoking Status Former Smoker  . Packs/day: 1.00  . Years: 30.00  . Pack years: 30.00  . Last attempt to quit: 09/01/2000  . Years since quitting: 17.5  Smokeless Tobacco Never Used    Goals Met:  Independence with exercise equipment Exercise tolerated well No report of cardiac concerns or symptoms Strength training completed today  Goals Unmet:  Not Applicable  Comments: Pt able to follow exercise prescription today without complaint.  Will continue to monitor for progression. Check out 1200.   Dr. Suresh Koneswaran is Medical Director for [place] Cardiac and Pulmonary Rehab. 

## 2018-03-07 ENCOUNTER — Ambulatory Visit (INDEPENDENT_AMBULATORY_CARE_PROVIDER_SITE_OTHER): Payer: Medicare Other | Admitting: *Deleted

## 2018-03-07 DIAGNOSIS — I4891 Unspecified atrial fibrillation: Secondary | ICD-10-CM | POA: Diagnosis not present

## 2018-03-07 DIAGNOSIS — Z5181 Encounter for therapeutic drug level monitoring: Secondary | ICD-10-CM | POA: Diagnosis not present

## 2018-03-07 DIAGNOSIS — I255 Ischemic cardiomyopathy: Secondary | ICD-10-CM | POA: Diagnosis not present

## 2018-03-07 LAB — POCT INR: INR: 4.2 — AB (ref 2.0–3.0)

## 2018-03-07 NOTE — Patient Instructions (Signed)
Took coumadin this morning.   Hold coumadin tomorrow , take 1/2 tablet on Thursday then decrease dose to 1/2 tablet daily except 1 tablet on Tuesdays, Thursdays and Saturdays Recheck in 1 week

## 2018-03-08 ENCOUNTER — Encounter (HOSPITAL_COMMUNITY)
Admission: RE | Admit: 2018-03-08 | Discharge: 2018-03-08 | Disposition: A | Discharge status: Home / Self Care | Payer: Medicare Other | Source: Ambulatory Visit | Attending: Internal Medicine | Admitting: Internal Medicine

## 2018-03-08 DIAGNOSIS — I5022 Chronic systolic (congestive) heart failure: Secondary | ICD-10-CM

## 2018-03-08 NOTE — Progress Notes (Signed)
Daily Session Note  Patient Details  Name: Noah [name] MRN: [id number] Date of Birth: [birth year] Referring Provider:     [place] from 02/17/2018 in [place]  Referring Provider  Bronson Ing      Encounter Date: 03/08/2018  Check In: Session Check In - 03/08/18 1100      Check-In   Supervising physician immediately available to respond to emergencies  See telemetry face sheet for immediately available MD    Location  [facility]    Staff Present  Benay Pike, Exercise Physiologist;Debra Wynetta Emery, RN, BSN    Medication changes reported      No    Fall or balance concerns reported     Yes    Comments  Patient has fallen 4 times in the past 12 months    Warm-up and Cool-down  Performed as group-led instruction    Resistance Training Performed  Yes    VAD Patient?  No    PAD/SET Patient?  No      Pain Assessment   Currently in Pain?  No/denies    Pain Score  0-No pain    Multiple Pain Sites  No       Capillary Blood Glucose: Results for orders placed or performed in visit on 03/07/18 (from the past 24 hour(s))  POCT INR     Status: Abnormal   Collection Time: 03/07/18  1:29 PM  Result Value Ref Range   INR 4.2 (A) 2.0 - 3.0      Social History   Tobacco Use  Smoking Status Former Smoker  . Packs/day: 1.00  . Years: 30.00  . Pack years: 30.00  . Last attempt to quit: 09/01/2000  . Years since quitting: 17.5  Smokeless Tobacco Never Used    Goals Met:  Independence with exercise equipment Exercise tolerated well No report of cardiac concerns or symptoms Strength training completed today  Goals Unmet:  Not Applicable  Comments: Pt able to follow exercise prescription today without complaint.  Will continue to monitor for progression. Check out 12:00.   Dr. Kate Sable is Medical Director for [facility] Cardiac and Pulmonary Rehab.

## 2018-03-10 ENCOUNTER — Encounter (HOSPITAL_COMMUNITY)
Admission: RE | Admit: 2018-03-10 | Discharge: 2018-03-10 | Disposition: A | Discharge status: Home / Self Care | Payer: Medicare Other | Source: Ambulatory Visit | Attending: Internal Medicine | Admitting: Internal Medicine

## 2018-03-10 DIAGNOSIS — I5022 Chronic systolic (congestive) heart failure: Secondary | ICD-10-CM | POA: Diagnosis not present

## 2018-03-10 NOTE — Progress Notes (Signed)
Daily Session Note  Patient Details  Name: Noah [name] MRN: [id number] Date of Birth: 1940-08-22 Referring Provider:     [place] from 02/17/2018 in [place]  Referring Provider  Bronson Ing      Encounter Date: 03/10/2018  Check In: Session Check In - 03/10/18 1100      Check-In   Supervising physician immediately available to respond to emergencies  See telemetry face sheet for immediately available MD    Location  [facility]    Staff Present  Benay Pike, Exercise Physiologist;Debra Wynetta Emery, RN, BSN    Medication changes reported      No    Fall or balance concerns reported     Yes    Comments  Patient has fallen 4 times in the past 12 months    Warm-up and Cool-down  Performed as group-led instruction    Resistance Training Performed  Yes    VAD Patient?  No    PAD/SET Patient?  No      Pain Assessment   Currently in Pain?  No/denies    Pain Score  0-No pain    Multiple Pain Sites  No       Capillary Blood Glucose: No results found for this or any previous visit (from the past 24 hour(s)).    Social History   Tobacco Use  Smoking Status Former Smoker  . Packs/day: 1.00  . Years: 30.00  . Pack years: 30.00  . Last attempt to quit: 09/01/2000  . Years since quitting: 17.5  Smokeless Tobacco Never Used    Goals Met:  Independence with exercise equipment Exercise tolerated well No report of cardiac concerns or symptoms Strength training completed today  Goals Unmet:  Not Applicable  Comments: Pt able to follow exercise prescription today without complaint.  Will continue to monitor for progression. Check out 12:00.   Dr. Kate Sable is Medical Director for [facility] Cardiac and Pulmonary Rehab.

## 2018-03-13 ENCOUNTER — Encounter (HOSPITAL_COMMUNITY)
Admission: RE | Admit: 2018-03-13 | Discharge: 2018-03-13 | Disposition: A | Discharge status: Home / Self Care | Payer: Medicare Other | Source: Ambulatory Visit | Attending: Internal Medicine | Admitting: Internal Medicine

## 2018-03-13 DIAGNOSIS — I5022 Chronic systolic (congestive) heart failure: Secondary | ICD-10-CM | POA: Diagnosis not present

## 2018-03-13 NOTE — Progress Notes (Signed)
Cardiac Individual Treatment Plan  Patient Details  Name: Noah [name] MRN: [id number] Date of Birth: [birth year] Referring Provider:     [place] from 02/17/2018 in [place]  Referring Provider  Bronson Ing      Initial Encounter Date:    CARDIAC REHAB PHASE II ORIENTATION from 02/17/2018 in [place]  Date  02/17/18      Visit Diagnosis: CHF (congestive heart failure), NYHA class I, chronic, systolic (HCC)  Patient's Home Medications on Admission:  Current Outpatient Medications:  .  acetaminophen (TYLENOL) 650 MG CR tablet, Take 1,300 mg by mouth at bedtime., Disp: , Rfl:  .  aspirin EC 81 MG tablet, Take 1 tablet (81 mg total) by mouth daily., Disp: 90 tablet, Rfl: 3 .  Coenzyme Q10 (COQ10) 100 MG CAPS, Take 100 mg by mouth every evening. , Disp: , Rfl:  .  cyclobenzaprine (FLEXERIL) 10 MG tablet, Take 10 mg by mouth 3 (three) times daily as needed for spasms., Disp: , Rfl: 3 .  finasteride (PROSCAR) 5 MG tablet, Take 5 mg by mouth every evening. , Disp: , Rfl: 4 .  FLUoxetine (PROZAC) 20 MG capsule, Take 20 mg by mouth every evening., Disp: , Rfl:  .  furosemide (LASIX) 40 MG tablet, Take 2 tabs (54m) by mouth every morning & 1 tab (450m every evening (Patient taking differently: Take 80 mg by mouth daily. ), Disp: 30 tablet, Rfl:  .  gabapentin (NEURONTIN) 100 MG capsule, Take 100 mg by mouth 2 (two) times daily. , Disp: , Rfl:  .  glipiZIDE (GLUCOTROL XL) 10 MG 24 hr tablet, Take 10 mg by mouth 2 (two) times daily., Disp: , Rfl:  .  glucose blood (ONE TOUCH ULTRA TEST) test strip, Check your sugars twice a day, Disp: 100 each, Rfl: 12 .  Insulin Glargine-Lixisenatide (SOLIQUA) 100-33 UNT-MCG/ML SOPN, Inject 40 Units into the skin every evening., Disp: , Rfl:  .  Insulin Pen Needle (NOVOFINE) 30G X 8 MM MISC, Inject 10 each into the skin as needed., Disp: 90 each, Rfl: 3 .  isosorbide mononitrate (IMDUR)  30 MG 24 hr tablet, Take 30 mg by mouth every evening. , Disp: , Rfl: 3 .  metFORMIN (GLUCOPHAGE-XR) 500 MG 24 hr tablet, Take 1,500 mg by mouth daily., Disp: , Rfl:  .  metoprolol (TOPROL-XL) 200 MG 24 hr tablet, Take 100 mg by mouth 2 (two) times daily., Disp: , Rfl:  .  Multiple Vitamin (MULTIVITAMIN) capsule, Take 1 capsule by mouth daily. , Disp: , Rfl:  .  Omega-3 Fatty Acids (FISH OIL) 1000 MG CAPS, Take 1,000 mg by mouth 2 (two) times daily. , Disp: , Rfl:  .  omeprazole (PRILOSEC) 20 MG capsule, Take 20 mg by mouth daily., Disp: , Rfl:  .  ranitidine (ZANTAC) 150 MG tablet, Take 150 mg by mouth at bedtime., Disp: , Rfl:  .  sacubitril-valsartan (ENTRESTO) 49-51 MG, Take 1 tablet by mouth 2 (two) times daily., Disp: 60 tablet, Rfl: 6 .  simvastatin (ZOCOR) 80 MG tablet, TAKE 80 MG (1 TABLET) DAILY. (Patient taking differently: Take 80 mg by mouth every evening. TAKE 80 MG (1 TABLET) DAILY.), Disp: 90 tablet, Rfl: 3 .  Tamsulosin HCl (FLOMAX) 0.4 MG CAPS, Take 0.8 mg by mouth every evening., Disp: , Rfl:  .  warfarin (COUMADIN) 5 MG tablet, Take 1 tablet (5 mg total) by mouth daily. Or as directed, Disp: 45 tablet, Rfl: 1 .  zolpidem (AMBIEN) 10 MG tablet, Take 10 mg by mouth at bedtime. , Disp: , Rfl:   Past Medical History: Past Medical History:  Diagnosis Date  . CAD S/P percutaneous coronary angioplasty    remote PCIs in 1990's- last CFX PCI 2008  . Chronic kidney disease, stage III (moderate) (HCC)   . Diabetes mellitus with complication (HCC)    with hyperglycemia and diabetic autonomic poly neuropathy  . Gastro-esophageal reflux disease with esophagitis   . Hyperlipidemia   . Hypertension   . Ischemic cardiomyopathy 01/20/2018   St Jude ICD   . Morbid obesity ([place])   . Obstructive sleep apnea    on CPAP  . Polyneuropathy   . Primary insomnia     Tobacco Use: Social History   Tobacco Use  Smoking Status Former Smoker  . Packs/day: 1.00  . Years: 30.00  . Pack  years: 30.00  . Last attempt to quit: 09/01/2000  . Years since quitting: 17.5  Smokeless Tobacco Never Used    Labs: Recent Review Scientist, physiological    Labs for ITP Cardiac and Pulmonary Rehab 09/09/2007   Cholestrol 121 ATP III CLASSIFICATION: <200     mg/dL   Desirable 200-239  mg/dL   Borderline High >=240    mg/dL   High   LDLCALC 75 Total Cholesterol/HDL:CHD Risk Coronary Heart Disease Risk Table Men   Women 1/2 Average Risk   3.4   3.3   HDL 28(L)   Trlycerides 92      Capillary Blood Glucose: Lab Results  Component Value Date   GLUCAP 224 (H) 01/21/2018   GLUCAP 214 (H) 01/21/2018   GLUCAP 252 (H) 01/21/2018   GLUCAP 186 (H) 01/20/2018   GLUCAP 92 01/20/2018     Exercise Target Goals: Exercise Program Goal: Individual exercise prescription set using results from initial 6 min walk test and THRR while considering  patient's activity barriers and safety.   Exercise Prescription Goal: Starting with aerobic activity 30 plus minutes a day, 3 days per week for initial exercise prescription. Provide home exercise prescription and guidelines that participant acknowledges understanding prior to discharge.  Activity Barriers & Risk Stratification: Activity Barriers & Cardiac Risk Stratification - 02/17/18 1415      Activity Barriers & Cardiac Risk Stratification   Activity Barriers  Deconditioning;Muscular Weakness;Shortness of Breath;History of Falls;Balance Concerns    Cardiac Risk Stratification  High       6 Minute Walk: 6 Minute Walk    Row Name 02/17/18 1413         6 Minute Walk   Phase  Initial     Distance  650 feet     Walk Time  6 minutes     # of Rest Breaks  0     MPH  1.23     METS  1.94     RPE  15     Perceived Dyspnea   13     VO2 Peak  2.11     Symptoms  Yes (comment)     Comments  3/10 back pain while walking     Resting HR  76 bpm     Resting BP  140/76     Resting Oxygen Saturation   95 %     Exercise Oxygen Saturation  during 6  min walk  90 %     Max Ex. HR  92 bpm     Max Ex. BP  162/78     2 Minute Post  BP  136/76        Oxygen Initial Assessment:   Oxygen Re-Evaluation:   Oxygen Discharge (Final Oxygen Re-Evaluation):   Initial Exercise Prescription: Initial Exercise Prescription - 02/17/18 1400      Date of Initial Exercise RX and Referring Provider   Date  02/17/18    Referring Provider  Koneswaran    Expected Discharge Date  05/20/17      Treadmill   MPH  0.8    Grade  0    Minutes  17    METs  1.61      NuStep   Level  1    SPM  65    Minutes  17    METs  1.8      Prescription Details   Frequency (times per week)  3    Duration  Progress to 30 minutes of continuous aerobic without signs/symptoms of physical distress      Intensity   THRR 40-80% of Max Heartrate  101-115-129    Ratings of Perceived Exertion  11-13    Perceived Dyspnea  0-4      Progression   Progression  Continue to progress workloads to maintain intensity without signs/symptoms of physical distress.      Resistance Training   Training Prescription  Yes    Weight  1    Reps  10-15       Perform Capillary Blood Glucose checks as needed.  Exercise Prescription Changes:  Exercise Prescription Changes    Row Name 03/09/18 0700             Response to Exercise   Blood Pressure (Admit)  136/70       Blood Pressure (Exercise)  148/62       Blood Pressure (Exit)  124/72       Heart Rate (Admit)  90 bpm       Heart Rate (Exercise)  104 bpm       Heart Rate (Exit)  75 bpm       Rating of Perceived Exertion (Exercise)  13       Comments  First two weeks of exercise        Duration  Progress to 30 minutes of  aerobic without signs/symptoms of physical distress       Intensity  THRR New 101-115-129         Progression   Progression  Continue to progress workloads to maintain intensity without signs/symptoms of physical distress.       Average METs  1.87         Resistance Training   Training  Prescription  Yes       Weight  1       Reps  10-15         Treadmill   MPH  1.1       Grade  0       Minutes  17       METs  1.84         NuStep   Level  1       SPM  85       Minutes  22       METs  1.9          Exercise Comments:  Exercise Comments    Row Name 03/13/18 1434           Exercise Comments  Patient is doing well in the program, he has attended 9 sessions  so far. He is working to get to feeling better, stronger and lose weight.           Exercise Goals and Review:  Exercise Goals    Row Name 02/17/18 1417             Exercise Goals   Increase Physical Activity  Yes       Intervention  Provide advice, education, support and counseling about physical activity/exercise needs.;Develop an individualized exercise prescription for aerobic and resistive training based on initial evaluation findings, risk stratification, comorbidities and participant's personal goals.       Expected Outcomes  Short Term: Attend rehab on a regular basis to increase amount of physical activity.;Long Term: Add in home exercise to make exercise part of routine and to increase amount of physical activity.;Long Term: Exercising regularly at least 3-5 days a week.       Increase Strength and Stamina  Yes       Intervention  Provide advice, education, support and counseling about physical activity/exercise needs.;Develop an individualized exercise prescription for aerobic and resistive training based on initial evaluation findings, risk stratification, comorbidities and participant's personal goals.       Expected Outcomes  Short Term: Increase workloads from initial exercise prescription for resistance, speed, and METs.;Short Term: Perform resistance training exercises routinely during rehab and add in resistance training at home;Long Term: Improve cardiorespiratory fitness, muscular endurance and strength as measured by increased METs and functional capacity (6MWT)       Able to  understand and use rate of perceived exertion (RPE) scale  Yes       Intervention  Provide education and explanation on how to use RPE scale       Expected Outcomes  Short Term: Able to use RPE daily in rehab to express subjective intensity level;Long Term:  Able to use RPE to guide intensity level when exercising independently       Able to understand and use Dyspnea scale  Yes       Intervention  Provide education and explanation on how to use Dyspnea scale       Expected Outcomes  Short Term: Able to use Dyspnea scale daily in rehab to express subjective sense of shortness of breath during exertion;Long Term: Able to use Dyspnea scale to guide intensity level when exercising independently       Knowledge and understanding of Target Heart Rate Range (THRR)  Yes       Intervention  Provide education and explanation of THRR including how the numbers were predicted and where they are located for reference       Expected Outcomes  Short Term: Able to state/look up THRR;Short Term: Able to use daily as guideline for intensity in rehab;Long Term: Able to use THRR to govern intensity when exercising independently       Able to check pulse independently  Yes       Intervention  Provide education and demonstration on how to check pulse in carotid and radial arteries.;Review the importance of being able to check your own pulse for safety during independent exercise       Expected Outcomes  Short Term: Able to explain why pulse checking is important during independent exercise;Long Term: Able to check pulse independently and accurately       Understanding of Exercise Prescription  Yes       Intervention  Provide education, explanation, and written materials on patient's individual exercise prescription  Expected Outcomes  Short Term: Able to explain program exercise prescription;Long Term: Able to explain home exercise prescription to exercise independently          Exercise Goals Re-Evaluation  : Exercise Goals Re-Evaluation    Row Name 03/13/18 1432             Exercise Goal Re-Evaluation   Exercise Goals Review  Increase Physical Activity;Increase Strength and Stamina;Able to understand and use Dyspnea scale;Able to check pulse independently;Understanding of Exercise Prescription;Knowledge and understanding of Target Heart Rate Range (THRR);Able to understand and use rate of perceived exertion (RPE) scale       Comments  Patient has done well in the program so far. He has tolerated the exercise well and is eager to get better. He has to watch his weight due to CHF and is strggling a bit with that. He is up to 1.65mh on the treadmill. We will continue to progress him as tolerated.        Expected Outcomes  Feel better and have the ability to do more without getting tired.            Discharge Exercise Prescription (Final Exercise Prescription Changes): Exercise Prescription Changes - 03/09/18 0700      Response to Exercise   Blood Pressure (Admit)  136/70    Blood Pressure (Exercise)  148/62    Blood Pressure (Exit)  124/72    Heart Rate (Admit)  90 bpm    Heart Rate (Exercise)  104 bpm    Heart Rate (Exit)  75 bpm    Rating of Perceived Exertion (Exercise)  13    Comments  First two weeks of exercise     Duration  Progress to 30 minutes of  aerobic without signs/symptoms of physical distress    Intensity  THRR New   101-115-129     Progression   Progression  Continue to progress workloads to maintain intensity without signs/symptoms of physical distress.    Average METs  1.87      Resistance Training   Training Prescription  Yes    Weight  1    Reps  10-15      Treadmill   MPH  1.1    Grade  0    Minutes  17    METs  1.84      NuStep   Level  1    SPM  85    Minutes  22    METs  1.9       Nutrition:  Target Goals: Understanding of nutrition guidelines, daily intake of sodium <15071m cholesterol <20038mcalories 30% from fat and 7% or less from  saturated fats, daily to have 5 or more servings of fruits and vegetables.  Biometrics: Pre Biometrics - 02/17/18 1418      Pre Biometrics   Height  _0  (1.753 m)    Weight  128.8 kg    Waist Circumference  58.5 inches    Hip Circumference  56.5 inches    Waist to Hip Ratio  1.04 %    BMI (Calculated)  41.91    Triceps Skinfold  12 mm    % Body Fat  41.6 %    Grip Strength  19.03 kg    Flexibility  0 in    Single Leg Stand  1.52 seconds        Nutrition Therapy Plan and Nutrition Goals: Nutrition Therapy & Goals - 03/03/18 1428      Personal  Nutrition Goals   Nutrition Goal  For heart healthy choices add >50% of whole grains, make half their plate fruits and vegetables. Discuss the difference between starchy vegetables and leafy greens, and how leafy vegetables provide fiber, helps maintain healthy weight, helps control blood glucose, and lowers cholesterol.  Discuss purchasing fresh or frozen vegetable to reduce sodium and not to add grease, fat or sugar. Consume <18oz of red meat per week. Consume lean cuts of meats and very little of meats high in sodium and nitrates such as pork and lunch meats. Discussed portion control for all food groups.      Comments  Patient attended RD class 03/02/18.       Nutrition Assessments: Nutrition Assessments - 02/17/18 1448      MEDFICTS Scores   Pre Score  41       Nutrition Goals Re-Evaluation: Nutrition Goals Re-Evaluation    Row Name 03/13/18 1513             Goals   Current Weight  274 lb (124.3 kg)       Nutrition Goal  For heart healthy choices add >50% of whole grains, make half their plate fruits and vegetables. Discuss the difference between starchy vegetables and leafy greens, and how leafy vegetables provide fiber, helps maintain healthy weight, helps control blood glucose, and lowers cholesterol.  Discuss purchasing fresh or frozen vegetable to reduce sodium and not to add grease, fat or sugar. Consume <18oz of red  meat per week. Consume lean cuts of meats and very little of meats high in sodium and nitrates such as pork and lunch meats. Discussed portion control for all food groups.         Comment  Patient has lost 10 lbs since his initial visit. He says he is trying to eat healthier. He has met with RD. Will continue to monitor.        Expected Outcome  Patient will continue to work toward his nutritional goals.           Nutrition Goals Discharge (Final Nutrition Goals Re-Evaluation): Nutrition Goals Re-Evaluation - 03/13/18 1513      Goals   Current Weight  274 lb (124.3 kg)    Nutrition Goal  For heart healthy choices add >50% of whole grains, make half their plate fruits and vegetables. Discuss the difference between starchy vegetables and leafy greens, and how leafy vegetables provide fiber, helps maintain healthy weight, helps control blood glucose, and lowers cholesterol.  Discuss purchasing fresh or frozen vegetable to reduce sodium and not to add grease, fat or sugar. Consume <18oz of red meat per week. Consume lean cuts of meats and very little of meats high in sodium and nitrates such as pork and lunch meats. Discussed portion control for all food groups.      Comment  Patient has lost 10 lbs since his initial visit. He says he is trying to eat healthier. He has met with RD. Will continue to monitor.     Expected Outcome  Patient will continue to work toward his nutritional goals.        Psychosocial: Target Goals: Acknowledge presence or absence of significant depression and/or stress, maximize coping skills, provide positive support system. Participant is able to verbalize types and ability to use techniques and skills needed for reducing stress and depression.  Initial Review & Psychosocial Screening: Initial Psych Review & Screening - 02/17/18 1445      Initial Review   Current issues with  None Identified      Family Dynamics   Good Support System?  Yes    Concerns  --   Would  like to have a bewtter relationship with his sons.      Barriers   Psychosocial barriers to participate in program  There are no identifiable barriers or psychosocial needs.      Screening Interventions   Interventions  Encouraged to exercise    Expected Outcomes  Short Term goal: Identification and review with participant of any Quality of Life or Depression concerns found by scoring the questionnaire.;Long Term goal: The participant improves quality of Life and PHQ9 Scores as seen by post scores and/or verbalization of changes       Quality of Life Scores: Quality of Life - 02/17/18 1422      Quality of Life   Select  Quality of Life      Quality of Life Scores   Health/Function Pre  17.13 %    Socioeconomic Pre  19.07 %    Psych/Spiritual Pre  19.36 %    Family Pre  20.4 %    GLOBAL Pre  18.47 %      Scores of 19 and below usually indicate a poorer quality of life in these areas.  A difference of  2-3 points is a clinically meaningful difference.  A difference of 2-3 points in the total score of the Quality of Life Index has been associated with significant improvement in overall quality of life, self-image, physical symptoms, and general health in studies assessing change in quality of life.  PHQ-9: Recent Review Flowsheet Data    Depression screen [facility] 2/9 02/17/2018   Decreased Interest 0   Down, Depressed, Hopeless 0   PHQ - 2 Score 0   Altered sleeping 1   Tired, decreased energy 2   Change in appetite 1   Feeling bad or failure about yourself  1   Trouble concentrating 1   Moving slowly or fidgety/restless 0   Suicidal thoughts 0   PHQ-9 Score 6   Difficult doing work/chores Somewhat difficult     Interpretation of Total Score  Total Score Depression Severity:  1-4 = Minimal depression, 5-9 = Mild depression, 10-14 = Moderate depression, 15-19 = Moderately severe depression, 20-27 = Severe depression   Psychosocial Evaluation and Intervention: Psychosocial  Evaluation - 02/17/18 1446      Psychosocial Evaluation & Interventions   Interventions  Encouraged to exercise with the program and follow exercise prescription    Continue Psychosocial Services   No Follow up required       Psychosocial Re-Evaluation: Psychosocial Re-Evaluation    [place] Name 03/13/18 1516             Psychosocial Re-Evaluation   Current issues with  None Identified       Comments  Patient initial QOL score was 18.47/and his PHQ_9 score was 6 with no psychosocial barriers identified.        Expected Outcomes  Patient will have no psychosocial issues identified at discharge with improved QOL and PHQ-9 scores.       Interventions  Stress management education;Encouraged to attend Cardiac Rehabilitation for the exercise;Relaxation education       Continue Psychosocial Services   No Follow up required          Psychosocial Discharge (Final Psychosocial Re-Evaluation): Psychosocial Re-Evaluation - 03/13/18 1516      Psychosocial Re-Evaluation   Current issues with  None Identified  Comments  Patient initial QOL score was 18.47/and his PHQ_9 score was 6 with no psychosocial barriers identified.     Expected Outcomes  Patient will have no psychosocial issues identified at discharge with improved QOL and PHQ-9 scores.    Interventions  Stress management education;Encouraged to attend Cardiac Rehabilitation for the exercise;Relaxation education    Continue Psychosocial Services   No Follow up required       Vocational Rehabilitation: Provide vocational rehab assistance to qualifying candidates.   Vocational Rehab Evaluation & Intervention: Vocational Rehab - 02/17/18 1449      Initial Vocational Rehab Evaluation & Intervention   Assessment shows need for Vocational Rehabilitation  No       Education: Education Goals: Education classes will be provided on a weekly basis, covering required topics. Participant will state understanding/return demonstration of  topics presented.  Learning Barriers/Preferences: Learning Barriers/Preferences - 02/17/18 1448      Learning Barriers/Preferences   Learning Barriers  None    Learning Preferences  Group Instruction;Individual Instruction;Skilled Demonstration       Education Topics: Hypertension, Hypertension Reduction -Define heart disease and high blood pressure. Discus how high blood pressure affects the body and ways to reduce high blood pressure.   CARDIAC REHAB PHASE II EXERCISE from 03/08/2018 in [place]  Date  02/22/18  Educator  D. Coad  Instruction Review Code  2- Demonstrated Understanding      Exercise and Your Heart -Discuss why it is important to exercise, the FITT principles of exercise, normal and abnormal responses to exercise, and how to exercise safely.   CARDIAC REHAB PHASE II EXERCISE from 03/08/2018 in [place]  Date  03/01/18  Educator  Coad  Instruction Review Code  2- Demonstrated Understanding      Angina -Discuss definition of angina, causes of angina, treatment of angina, and how to decrease risk of having angina.   CARDIAC REHAB PHASE II EXERCISE from 03/08/2018 in [place]  Date  03/08/18  Educator  Etheleen Mayhew   Instruction Review Code  2- Demonstrated Understanding      Cardiac Medications -Review what the following cardiac medications are used for, how they affect the body, and side effects that may occur when taking the medications.  Medications include Aspirin, Beta blockers, calcium channel blockers, ACE Inhibitors, angiotensin receptor blockers, diuretics, digoxin, and antihyperlipidemics.   Congestive Heart Failure -Discuss the definition of CHF, how to live with CHF, the signs and symptoms of CHF, and how keep track of weight and sodium intake.   Heart Disease and Intimacy -Discus the effect sexual activity has on the heart, how changes occur during intimacy as we age,  and safety during sexual activity.   Smoking Cessation / COPD -Discuss different methods to quit smoking, the health benefits of quitting smoking, and the definition of COPD.   Nutrition I: Fats -Discuss the types of cholesterol, what cholesterol does to the heart, and how cholesterol levels can be controlled.   Nutrition II: Labels -Discuss the different components of food labels and how to read food label   Heart Parts/Heart Disease and PAD -Discuss the anatomy of the heart, the pathway of blood circulation through the heart, and these are affected by heart disease.   Stress I: Signs and Symptoms -Discuss the causes of stress, how stress may lead to anxiety and depression, and ways to limit stress.   Stress II: Relaxation -Discuss different types of relaxation techniques to limit stress.  Warning Signs of Stroke / TIA -Discuss definition of a stroke, what the signs and symptoms are of a stroke, and how to identify when someone is having stroke.   Knowledge Questionnaire Score: Knowledge Questionnaire Score - 02/17/18 1448      Knowledge Questionnaire Score   Pre Score  21/24       Core Components/Risk Factors/Patient Goals at Admission: Personal Goals and Risk Factors at Admission - 02/17/18 1449      Core Components/Risk Factors/Patient Goals on Admission    Weight Management  Weight Loss    Personal Goal Other  Yes    Personal Goal  To feel better, be able to do more w/o getting tired.     Intervention  Attend CR 3 x week and supplement 2 x week with at home exercise    Expected Outcomes  Reach personal goals.        Core Components/Risk Factors/Patient Goals Review:  Goals and Risk Factor Review    Row Name 03/13/18 1514             Core Components/Risk Factors/Patient Goals Review   Personal Goals Review  Weight Management/Obesity Feel better; do more without getting tired.        Review  Patient has completed 9 sessions losing 10 lbs since his  initial visit. He is doing well in the program with some progression. He says he feels like his stamina has improved and feels better overall. He is pleased with his progress so far and hopes to gain more as he continues.        Expected Outcomes  Patient will continue to attend sessions and complete the program meeting his personal goals.           Core Components/Risk Factors/Patient Goals at Discharge (Final Review):  Goals and Risk Factor Review - 03/13/18 1514      Core Components/Risk Factors/Patient Goals Review   Personal Goals Review  Weight Management/Obesity   Feel better; do more without getting tired.    Review  Patient has completed 9 sessions losing 10 lbs since his initial visit. He is doing well in the program with some progression. He says he feels like his stamina has improved and feels better overall. He is pleased with his progress so far and hopes to gain more as he continues.     Expected Outcomes  Patient will continue to attend sessions and complete the program meeting his personal goals.        ITP Comments: ITP Comments    Row Name 02/17/18 1319 02/20/18 1506         ITP Comments  Patient is coming to CR per Dr. Selinda Michaels request due to CHF. Had to delay starting due to needing pacemaker placement. He is eager to get started. He also inform us of his balance issues.   Patient new to the program. He has completed 2 sessions. Will continue to monitor for progress.          Comments: ITP 30 Day REVIEW Patient doing well in the program. Will continue to monitor for progress.

## 2018-03-13 NOTE — Progress Notes (Signed)
Daily Session Note  Patient Details  Name: Noah [name] MRN: [id number] Date of Birth: 11/04/1940 Referring Provider:     [place] from 02/17/2018 in [place]  Referring Provider  Bronson Ing      Encounter Date: 03/13/2018  Check In: Session Check In - 03/13/18 1100      Check-In   Supervising physician immediately available to respond to emergencies  See telemetry face sheet for immediately available MD    Location  [facility]    Staff Present  Benay Pike, Exercise Physiologist;Babacar Haycraft Wynetta Emery, RN, BSN    Medication changes reported      No    Fall or balance concerns reported     Yes    Comments  Patient has fallen 4 times in the past 12 months    Warm-up and Cool-down  Performed as group-led instruction    Resistance Training Performed  Yes    VAD Patient?  No    PAD/SET Patient?  No      Pain Assessment   Currently in Pain?  No/denies    Pain Score  0-No pain    Multiple Pain Sites  No       Capillary Blood Glucose: No results found for this or any previous visit (from the past 24 hour(s)).    Social History   Tobacco Use  Smoking Status Former Smoker  . Packs/day: 1.00  . Years: 30.00  . Pack years: 30.00  . Last attempt to quit: 09/01/2000  . Years since quitting: 17.5  Smokeless Tobacco Never Used    Goals Met:  Independence with exercise equipment Exercise tolerated well No report of cardiac concerns or symptoms Strength training completed today  Goals Unmet:  Not Applicable  Comments: Pt able to follow exercise prescription today without complaint.  Will continue to monitor for progression. Check out 1200.   Dr. Kate Sable is Medical Director for [facility] Cardiac and Pulmonary Rehab.

## 2018-03-14 ENCOUNTER — Ambulatory Visit (INDEPENDENT_AMBULATORY_CARE_PROVIDER_SITE_OTHER): Payer: Medicare Other | Admitting: *Deleted

## 2018-03-14 DIAGNOSIS — I4891 Unspecified atrial fibrillation: Secondary | ICD-10-CM

## 2018-03-14 DIAGNOSIS — Z5181 Encounter for therapeutic drug level monitoring: Secondary | ICD-10-CM | POA: Diagnosis not present

## 2018-03-14 DIAGNOSIS — I255 Ischemic cardiomyopathy: Secondary | ICD-10-CM

## 2018-03-14 LAB — POCT INR: INR: 2.3 (ref 2.0–3.0)

## 2018-03-14 MED ORDER — WARFARIN SODIUM 5 MG PO TABS: ORAL_TABLET | ORAL | 3 refills | Status: DC

## 2018-03-14 NOTE — Patient Instructions (Signed)
Took coumadin this morning.   Continue coumadin 1/2 tablet daily except 1 tablet on Tuesdays, Thursdays and Saturdays Recheck in 2 weeks

## 2018-03-15 ENCOUNTER — Encounter (HOSPITAL_COMMUNITY)
Admission: RE | Admit: 2018-03-15 | Discharge: 2018-03-15 | Disposition: A | Discharge status: Home / Self Care | Payer: Medicare Other | Source: Ambulatory Visit | Attending: Internal Medicine | Admitting: Internal Medicine

## 2018-03-15 DIAGNOSIS — I5022 Chronic systolic (congestive) heart failure: Secondary | ICD-10-CM

## 2018-03-15 NOTE — Progress Notes (Signed)
Daily Session Note  Patient Details  Name: Noah [name] MRN: [id number] Date of Birth: 1940/12/02 Referring Provider:     [place] from 02/17/2018 in [place]  Referring Provider  Bronson Ing      Encounter Date: 03/15/2018  Check In: Session Check In - 03/15/18 1100      Check-In   Supervising physician immediately available to respond to emergencies  See telemetry face sheet for immediately available MD    Location  [facility]    Staff Present  Benay Pike, Exercise Physiologist;Broadus Costilla Wynetta Emery, RN, BSN    Medication changes reported      No    Fall or balance concerns reported     Yes    Comments  Patient has fallen 4 times in the past 12 months    Warm-up and Cool-down  Performed as group-led instruction    Resistance Training Performed  Yes    VAD Patient?  No    PAD/SET Patient?  No      Pain Assessment   Currently in Pain?  No/denies    Pain Score  0-No pain    Multiple Pain Sites  No       Capillary Blood Glucose: Results for orders placed or performed in visit on 03/14/18 (from the past 24 hour(s))  POCT INR     Status: Normal   Collection Time: 03/14/18  2:40 PM  Result Value Ref Range   INR 2.3 2.0 - 3.0      Social History   Tobacco Use  Smoking Status Former Smoker  . Packs/day: 1.00  . Years: 30.00  . Pack years: 30.00  . Last attempt to quit: 09/01/2000  . Years since quitting: 17.5  Smokeless Tobacco Never Used    Goals Met:  Independence with exercise equipment Exercise tolerated well No report of cardiac concerns or symptoms Strength training completed today  Goals Unmet:  Not Applicable  Comments: Pt able to follow exercise prescription today without complaint.  Will continue to monitor for progression. Check out 1200.   Dr. Kate Sable is Medical Director for [facility] Cardiac and Pulmonary Rehab.

## 2018-03-17 ENCOUNTER — Encounter (HOSPITAL_COMMUNITY): Payer: Medicare Other

## 2018-03-17 ENCOUNTER — Other Ambulatory Visit: Payer: Self-pay | Admitting: Cardiovascular Disease

## 2018-03-20 ENCOUNTER — Encounter (HOSPITAL_COMMUNITY)
Admission: RE | Admit: 2018-03-20 | Discharge: 2018-03-20 | Disposition: A | Discharge status: Home / Self Care | Payer: Medicare Other | Source: Ambulatory Visit | Attending: Internal Medicine | Admitting: Internal Medicine

## 2018-03-20 DIAGNOSIS — I5022 Chronic systolic (congestive) heart failure: Secondary | ICD-10-CM | POA: Diagnosis not present

## 2018-03-20 NOTE — Progress Notes (Signed)
Daily Session Note  Patient Details  Name: Noah [name] MRN: [id number] Date of Birth: Sep 08, 1940 Referring Provider:     [place] from 02/17/2018 in [place]  Referring Provider  Bronson Ing      Encounter Date: 03/20/2018  Check In: Session Check In - 03/20/18 1100      Check-In   Supervising physician immediately available to respond to emergencies  See telemetry face sheet for immediately available MD    Location  [facility]    Staff Present  Benay Pike, Exercise Physiologist;Kimley Apsey Wynetta Emery, RN, BSN    Medication changes reported      No    Fall or balance concerns reported     Yes    Comments  Patient has fallen 4 times in the past 12 months    Warm-up and Cool-down  Performed as group-led instruction    Resistance Training Performed  Yes    VAD Patient?  No    PAD/SET Patient?  No      Pain Assessment   Currently in Pain?  No/denies    Pain Score  0-No pain    Multiple Pain Sites  No       Capillary Blood Glucose: No results found for this or any previous visit (from the past 24 hour(s)).    Social History   Tobacco Use  Smoking Status Former Smoker  . Packs/day: 1.00  . Years: 30.00  . Pack years: 30.00  . Last attempt to quit: 09/01/2000  . Years since quitting: 17.5  Smokeless Tobacco Never Used    Goals Met:  Independence with exercise equipment Exercise tolerated well No report of cardiac concerns or symptoms Strength training completed today  Goals Unmet:  Not Applicable  Comments: Pt able to follow exercise prescription today without complaint.  Will continue to monitor for progression. Check out 1200.   Dr. Kate Sable is Medical Director for [facility] Cardiac and Pulmonary Rehab.

## 2018-03-22 ENCOUNTER — Encounter (HOSPITAL_COMMUNITY)
Admission: RE | Admit: 2018-03-22 | Discharge: 2018-03-22 | Disposition: A | Discharge status: Home / Self Care | Payer: Medicare Other | Source: Ambulatory Visit | Attending: Internal Medicine | Admitting: Internal Medicine

## 2018-03-22 DIAGNOSIS — I5022 Chronic systolic (congestive) heart failure: Secondary | ICD-10-CM

## 2018-03-22 NOTE — Progress Notes (Signed)
Daily Session Note  Patient Details  Name: Noah [name] MRN: [id number] Date of Birth: 03/01/1941 Referring Provider:     [place] from 02/17/2018 in [place]  Referring Provider  Bronson Ing      Encounter Date: 03/22/2018  Check In: Session Check In - 03/22/18 1100      Check-In   Supervising physician immediately available to respond to emergencies  See telemetry face sheet for immediately available MD    Location  [facility]    Staff Present  Benay Pike, Exercise Physiologist;Vermell Madrid Wynetta Emery, RN, BSN    Medication changes reported      No    Fall or balance concerns reported     Yes    Comments  Patient has fallen 4 times in the past 12 months    Warm-up and Cool-down  Performed as group-led instruction    Resistance Training Performed  Yes    VAD Patient?  No    PAD/SET Patient?  No      Pain Assessment   Currently in Pain?  No/denies    Pain Score  0-No pain    Multiple Pain Sites  No       Capillary Blood Glucose: No results found for this or any previous visit (from the past 24 hour(s)).    Social History   Tobacco Use  Smoking Status Former Smoker  . Packs/day: 1.00  . Years: 30.00  . Pack years: 30.00  . Last attempt to quit: 09/01/2000  . Years since quitting: 17.5  Smokeless Tobacco Never Used    Goals Met:  Independence with exercise equipment Exercise tolerated well No report of cardiac concerns or symptoms Strength training completed today  Goals Unmet:  Not Applicable  Comments: Pt able to follow exercise prescription today without complaint.  Will continue to monitor for progression. Check out 1200.   Dr. Kate Sable is Medical Director for [facility] Cardiac and Pulmonary Rehab.

## 2018-03-24 ENCOUNTER — Encounter (HOSPITAL_COMMUNITY)
Admission: RE | Admit: 2018-03-24 | Discharge: 2018-03-24 | Disposition: A | Discharge status: Home / Self Care | Payer: Medicare Other | Source: Ambulatory Visit | Attending: Internal Medicine | Admitting: Internal Medicine

## 2018-03-24 DIAGNOSIS — I5022 Chronic systolic (congestive) heart failure: Secondary | ICD-10-CM | POA: Diagnosis not present

## 2018-03-24 NOTE — Progress Notes (Signed)
Daily Session Note  Patient Details  Name: Noah [name] MRN: [id number] Date of Birth: 09-08-1940 Referring Provider:     [place] from 02/17/2018 in [place]  Referring Provider  Bronson Ing      Encounter Date: 03/24/2018  Check In: Session Check In - 03/24/18 1545      Check-In   Supervising physician immediately available to respond to emergencies  See telemetry face sheet for immediately available MD    Location  [facility]    Staff Present  Benay Pike, Exercise Physiologist;Debra Wynetta Emery, RN, BSN    Medication changes reported      No    Fall or balance concerns reported     Yes    Comments  Patient has fallen 4 times in the past 12 months    Warm-up and Cool-down  Performed as group-led instruction    Resistance Training Performed  Yes    VAD Patient?  No    PAD/SET Patient?  No      Pain Assessment   Currently in Pain?  No/denies    Pain Score  0-No pain    Multiple Pain Sites  No       Capillary Blood Glucose: No results found for this or any previous visit (from the past 24 hour(s)).    Social History   Tobacco Use  Smoking Status Former Smoker  . Packs/day: 1.00  . Years: 30.00  . Pack years: 30.00  . Last attempt to quit: 09/01/2000  . Years since quitting: 17.5  Smokeless Tobacco Never Used    Goals Met:  Independence with exercise equipment Exercise tolerated well No report of cardiac concerns or symptoms Strength training completed today  Goals Unmet:  Not Applicable  Comments: Pt able to follow exercise prescription today without complaint.  Will continue to monitor for progression. Check out 4:$5.   Dr. Kate Sable is Medical Director for [place] and Pulmonary Rehab.

## 2018-03-27 ENCOUNTER — Ambulatory Visit (INDEPENDENT_AMBULATORY_CARE_PROVIDER_SITE_OTHER): Payer: Medicare Other | Admitting: *Deleted

## 2018-03-27 ENCOUNTER — Encounter (HOSPITAL_COMMUNITY)
Admission: RE | Admit: 2018-03-27 | Discharge: 2018-03-27 | Disposition: A | Discharge status: Home / Self Care | Payer: Medicare Other | Source: Ambulatory Visit | Attending: Internal Medicine | Admitting: Internal Medicine

## 2018-03-27 DIAGNOSIS — I4891 Unspecified atrial fibrillation: Secondary | ICD-10-CM

## 2018-03-27 DIAGNOSIS — I5022 Chronic systolic (congestive) heart failure: Secondary | ICD-10-CM

## 2018-03-27 DIAGNOSIS — I255 Ischemic cardiomyopathy: Secondary | ICD-10-CM | POA: Diagnosis not present

## 2018-03-27 DIAGNOSIS — Z5181 Encounter for therapeutic drug level monitoring: Secondary | ICD-10-CM | POA: Diagnosis not present

## 2018-03-27 LAB — POCT INR: INR: 4.1 — AB (ref 2.0–3.0)

## 2018-03-27 NOTE — Progress Notes (Signed)
Daily Session Note  Patient Details  Name: Noah [name] MRN: [id number] Date of Birth: October 14, 1940 Referring Provider:     [place] from 02/17/2018 in [place]  Referring Provider  Bronson Ing      Encounter Date: 03/27/2018  Check In: Session Check In - 03/27/18 1100      Check-In   Supervising physician immediately available to respond to emergencies  See telemetry face sheet for immediately available MD    Location  [facility]    Staff Present  Benay Pike, Exercise Physiologist;Chardonnay Holzmann Wynetta Emery, RN, BSN    Medication changes reported      No    Fall or balance concerns reported     Yes    Comments  Patient has fallen 4 times in the past 12 months    Warm-up and Cool-down  Performed as group-led instruction    Resistance Training Performed  Yes    VAD Patient?  No    PAD/SET Patient?  No      Pain Assessment   Currently in Pain?  No/denies    Pain Score  0-No pain    Multiple Pain Sites  No       Capillary Blood Glucose: Results for orders placed or performed in visit on 03/27/18 (from the past 24 hour(s))  POCT INR     Status: Abnormal   Collection Time: 03/27/18 10:29 AM  Result Value Ref Range   INR 4.1 (A) 2.0 - 3.0      Social History   Tobacco Use  Smoking Status Former Smoker  . Packs/day: 1.00  . Years: 30.00  . Pack years: 30.00  . Last attempt to quit: 09/01/2000  . Years since quitting: 17.5  Smokeless Tobacco Never Used    Goals Met:  Independence with exercise equipment Exercise tolerated well No report of cardiac concerns or symptoms Strength training completed today  Goals Unmet:  Not Applicable  Comments: Pt able to follow exercise prescription today without complaint.  Will continue to monitor for progression. Check out 1200.   Dr. Kate Sable is Medical Director for [facility] Cardiac and Pulmonary Rehab.

## 2018-03-27 NOTE — Patient Instructions (Signed)
Took coumadin this morning.   Hold coumadin tomorrow then decrease dose to 1/2 tablet daily except 1 tablet on Tuesdays and Saturdays Recheck in 1 week

## 2018-03-29 ENCOUNTER — Encounter (HOSPITAL_COMMUNITY)
Admission: RE | Admit: 2018-03-29 | Discharge: 2018-03-29 | Disposition: A | Discharge status: Home / Self Care | Payer: Medicare Other | Source: Ambulatory Visit | Attending: Internal Medicine | Admitting: Internal Medicine

## 2018-03-29 DIAGNOSIS — I5022 Chronic systolic (congestive) heart failure: Secondary | ICD-10-CM | POA: Diagnosis not present

## 2018-03-29 NOTE — Progress Notes (Signed)
Daily Session Note  Patient Details  Name: Noah [name] MRN: [id number] Date of Birth: October 25, 1940 Referring Provider:     [place] from 02/17/2018 in [place]  Referring Provider  Bronson Ing      Encounter Date: 03/29/2018  Check In: Session Check In - 03/29/18 1100      Check-In   Supervising physician immediately available to respond to emergencies  See telemetry face sheet for immediately available MD    Location  [facility]    Staff Present  Benay Pike, Exercise Physiologist;Daquana Paddock Wynetta Emery, RN, BSN    Medication changes reported      No    Fall or balance concerns reported     Yes    Comments  Patient has fallen 4 times in the past 12 months    Warm-up and Cool-down  Performed as group-led instruction    Resistance Training Performed  Yes    VAD Patient?  No    PAD/SET Patient?  No      Pain Assessment   Currently in Pain?  No/denies    Pain Score  0-No pain    Multiple Pain Sites  No       Capillary Blood Glucose: No results found for this or any previous visit (from the past 24 hour(s)).    Social History   Tobacco Use  Smoking Status Former Smoker  . Packs/day: 1.00  . Years: 30.00  . Pack years: 30.00  . Last attempt to quit: 09/01/2000  . Years since quitting: 17.5  Smokeless Tobacco Never Used    Goals Met:  Independence with exercise equipment Exercise tolerated well No report of cardiac concerns or symptoms Strength training completed today  Goals Unmet:  Not Applicable  Comments: Pt able to follow exercise prescription today without complaint.  Will continue to monitor for progression. Check out 1200.   Dr. Kate Sable is Medical Director for [facility] Cardiac and Pulmonary Rehab.

## 2018-03-31 ENCOUNTER — Encounter (HOSPITAL_COMMUNITY)
Admission: RE | Admit: 2018-03-31 | Discharge: 2018-03-31 | Disposition: A | Discharge status: Home / Self Care | Payer: Medicare Other | Source: Ambulatory Visit | Attending: Internal Medicine | Admitting: Internal Medicine

## 2018-03-31 DIAGNOSIS — I5022 Chronic systolic (congestive) heart failure: Secondary | ICD-10-CM | POA: Diagnosis not present

## 2018-03-31 NOTE — Progress Notes (Signed)
Daily Session Note  Patient Details  Name: Noah [name] MRN: [id number] Date of Birth: 09/06/1940 Referring Provider:     [facility]ARDIAC REHAB PHASE II ORIENTATION from 02/17/2018 in [place] C[facility]  Referring Provider  K[place]war[place]      Encounter Date: 03/31/2018  Check In: Session Check In - 03/31/18 1100      Check-In   Supervising physician immediately available to respond to emergencies  See telemetry face sheet for immediately available MD    Location  [facility]    Staff Present   , Exercise Physiologist;Debra Johnson, RN, BSN;Diane Coad, MS, EP, CHC, Exercise Physiologist    Medication changes reported      No    Fall or balance concerns reported     Yes    Comments  Patient has fallen 4 times in the past 12 months    Warm-up and Cool-down  Performed as group-led instruction    Resistance Training Performed  Yes    VAD Patient?  No    PAD/SET Patient?  No      Pain Assessment   Currently in Pain?  No/denies    Pain Score  0-No pain    Multiple Pain Sites  No       Capillary Blood Glucose: No results found for this or any previous visit (from the past 24 hour(s)).    Social History   Tobacco Use  Smoking Status Former Smoker  . Packs/day: 1.00  . Years: 30.00  . Pack years: 30.00  . Last attempt to quit: 09/01/2000  . Years since quitting: 17.5  Smokeless Tobacco Never Used    Goals Met:  Independence with exercise equipment Exercise tolerated well No report of cardiac concerns or symptoms Strength training completed today  Goals Unmet:  Not Applicable  Comments: Pt able to follow exercise prescription today without complaint.  Will continue to monitor for progression. Check out 1200.   Dr. Suresh Koneswaran is Medical Director for [place] Cardiac and Pulmonary Rehab. 

## 2018-04-03 ENCOUNTER — Encounter (HOSPITAL_COMMUNITY)
Admission: RE | Admit: 2018-04-03 | Discharge: 2018-04-03 | Disposition: A | Discharge status: Home / Self Care | Payer: Medicare Other | Source: Ambulatory Visit | Attending: Internal Medicine | Admitting: Internal Medicine

## 2018-04-03 DIAGNOSIS — I5022 Chronic systolic (congestive) heart failure: Secondary | ICD-10-CM

## 2018-04-03 NOTE — Progress Notes (Signed)
Cardiac Individual Treatment Plan  Patient Details  Name: Noah [name] MRN: [id number] Date of Birth: [birth year] Referring Provider:     [place] from 02/17/2018 in [facility] [place]  Referring Provider  Bronson Ing      Initial Encounter Date:    CARDIAC REHAB PHASE II ORIENTATION from 02/17/2018 in [facility]  Date  02/17/18      Visit Diagnosis: CHF (congestive heart failure), NYHA class I, chronic, systolic (HCC)  Patient's Home Medications on Admission:  Current Outpatient Medications:  .  acetaminophen (TYLENOL) 650 MG CR tablet, Take 1,300 mg by mouth at bedtime., Disp: , Rfl:  .  aspirin EC 81 MG tablet, Take 1 tablet (81 mg total) by mouth daily., Disp: 90 tablet, Rfl: 3 .  Coenzyme Q10 (COQ10) 100 MG CAPS, Take 100 mg by mouth every evening. , Disp: , Rfl:  .  cyclobenzaprine (FLEXERIL) 10 MG tablet, Take 10 mg by mouth 3 (three) times daily as needed for spasms., Disp: , Rfl: 3 .  finasteride (PROSCAR) 5 MG tablet, Take 5 mg by mouth every evening. , Disp: , Rfl: 4 .  FLUoxetine (PROZAC) 20 MG capsule, Take 20 mg by mouth every evening., Disp: , Rfl:  .  furosemide (LASIX) 40 MG tablet, Take 2 tabs (19m) by mouth every morning & 1 tab (459m every evening (Patient taking differently: Take 80 mg by mouth daily. ), Disp: 30 tablet, Rfl:  .  gabapentin (NEURONTIN) 100 MG capsule, Take 100 mg by mouth 2 (two) times daily. , Disp: , Rfl:  .  glipiZIDE (GLUCOTROL XL) 10 MG 24 hr tablet, Take 10 mg by mouth 2 (two) times daily., Disp: , Rfl:  .  glucose blood (ONE TOUCH ULTRA TEST) test strip, Check your sugars twice a day, Disp: 100 each, Rfl: 12 .  Insulin Glargine-Lixisenatide (SOLIQUA) 100-33 UNT-MCG/ML SOPN, Inject 40 Units into the skin every evening., Disp: , Rfl:  .  Insulin Pen Needle (NOVOFINE) 30G X 8 MM MISC, Inject 10 each into the skin as needed., Disp: 90 each, Rfl: 3 .  isosorbide mononitrate (IMDUR)  30 MG 24 hr tablet, Take 30 mg by mouth every evening. , Disp: , Rfl: 3 .  metFORMIN (GLUCOPHAGE-XR) 500 MG 24 hr tablet, Take 1,500 mg by mouth daily., Disp: , Rfl:  .  metoprolol (TOPROL-XL) 200 MG 24 hr tablet, Take 100 mg by mouth 2 (two) times daily., Disp: , Rfl:  .  Multiple Vitamin (MULTIVITAMIN) capsule, Take 1 capsule by mouth daily. , Disp: , Rfl:  .  Omega-3 Fatty Acids (FISH OIL) 1000 MG CAPS, Take 1,000 mg by mouth 2 (two) times daily. , Disp: , Rfl:  .  omeprazole (PRILOSEC) 20 MG capsule, Take 20 mg by mouth daily., Disp: , Rfl:  .  ranitidine (ZANTAC) 150 MG tablet, Take 150 mg by mouth at bedtime., Disp: , Rfl:  .  sacubitril-valsartan (ENTRESTO) 49-51 MG, Take 1 tablet by mouth 2 (two) times daily., Disp: 60 tablet, Rfl: 6 .  simvastatin (ZOCOR) 80 MG tablet, TAKE 80 MG (1 TABLET) DAILY. (Patient taking differently: Take 80 mg by mouth every evening. TAKE 80 MG (1 TABLET) DAILY.), Disp: 90 tablet, Rfl: 3 .  Tamsulosin HCl (FLOMAX) 0.4 MG CAPS, Take 0.8 mg by mouth every evening., Disp: , Rfl:  .  warfarin (COUMADIN) 5 MG tablet, Take 1/2 tablet daily except 1 tablet on Tuesdays, Thursdays and Saturdays, Disp: 45 tablet, Rfl: 3 .  zolpidem (AMBIEN) 10 MG tablet, Take 10 mg by mouth at bedtime. , Disp: , Rfl:   Past Medical History: Past Medical History:  Diagnosis Date  . CAD S/P percutaneous coronary angioplasty    remote PCIs in 1990's- last CFX PCI 2008  . Chronic kidney disease, stage III (moderate) (HCC)   . Diabetes mellitus with complication (HCC)    with hyperglycemia and diabetic autonomic poly neuropathy  . Gastro-esophageal reflux disease with esophagitis   . Hyperlipidemia   . Hypertension   . Ischemic cardiomyopathy 01/20/2018   St Jude ICD   . Morbid obesity ([place])   . Obstructive sleep apnea    on CPAP  . Polyneuropathy   . Primary insomnia     Tobacco Use: Social History   Tobacco Use  Smoking Status Former Smoker  . Packs/day: 1.00  . Years:  30.00  . Pack years: 30.00  . Last attempt to quit: 09/01/2000  . Years since quitting: 17.5  Smokeless Tobacco Never Used    Labs: Recent Review Scientist, physiological    Labs for ITP Cardiac and Pulmonary Rehab 09/09/2007   Cholestrol 121 ATP III CLASSIFICATION: <200     mg/dL   Desirable 200-239  mg/dL   Borderline High >=240    mg/dL   High   LDLCALC 75 Total Cholesterol/HDL:CHD Risk Coronary Heart Disease Risk Table Men   Women 1/2 Average Risk   3.4   3.3   HDL 28(L)   Trlycerides 92      Capillary Blood Glucose: Lab Results  Component Value Date   GLUCAP 224 (H) 01/21/2018   GLUCAP 214 (H) 01/21/2018   GLUCAP 252 (H) 01/21/2018   GLUCAP 186 (H) 01/20/2018   GLUCAP 92 01/20/2018     Exercise Target Goals: Exercise Program Goal: Individual exercise prescription set using results from initial 6 min walk test and THRR while considering  patient's activity barriers and safety.   Exercise Prescription Goal: Starting with aerobic activity 30 plus minutes a day, 3 days per week for initial exercise prescription. Provide home exercise prescription and guidelines that participant acknowledges understanding prior to discharge.  Activity Barriers & Risk Stratification: Activity Barriers & Cardiac Risk Stratification - 02/17/18 1415      Activity Barriers & Cardiac Risk Stratification   Activity Barriers  Deconditioning;Muscular Weakness;Shortness of Breath;History of Falls;Balance Concerns    Cardiac Risk Stratification  High       6 Minute Walk: 6 Minute Walk    Row Name 02/17/18 1413         6 Minute Walk   Phase  Initial     Distance  650 feet     Walk Time  6 minutes     # of Rest Breaks  0     MPH  1.23     METS  1.94     RPE  15     Perceived Dyspnea   13     VO2 Peak  2.11     Symptoms  Yes (comment)     Comments  3/10 back pain while walking     Resting HR  76 bpm     Resting BP  140/76     Resting Oxygen Saturation   95 %     Exercise Oxygen  Saturation  during 6 min walk  90 %     Max Ex. HR  92 bpm     Max Ex. BP  162/78     2 Minute Post  BP  136/76        Oxygen Initial Assessment:   Oxygen Re-Evaluation:   Oxygen Discharge (Final Oxygen Re-Evaluation):   Initial Exercise Prescription: Initial Exercise Prescription - 02/17/18 1400      Date of Initial Exercise RX and Referring Provider   Date  02/17/18    Referring Provider  Koneswaran    Expected Discharge Date  05/20/17      Treadmill   MPH  0.8    Grade  0    Minutes  17    METs  1.61      NuStep   Level  1    SPM  65    Minutes  17    METs  1.8      Prescription Details   Frequency (times per week)  3    Duration  Progress to 30 minutes of continuous aerobic without signs/symptoms of physical distress      Intensity   THRR 40-80% of Max Heartrate  101-115-129    Ratings of Perceived Exertion  11-13    Perceived Dyspnea  0-4      Progression   Progression  Continue to progress workloads to maintain intensity without signs/symptoms of physical distress.      Resistance Training   Training Prescription  Yes    Weight  1    Reps  10-15       Perform Capillary Blood Glucose checks as needed.  Exercise Prescription Changes:  Exercise Prescription Changes    Row Name 03/09/18 0700 03/21/18 1000 04/03/18 1400         Response to Exercise   Blood Pressure (Admit)  136/70  124/68  130/60     Blood Pressure (Exercise)  148/62  150/70  138/60     Blood Pressure (Exit)  124/72  120/54  120/60     Heart Rate (Admit)  90 bpm  86 bpm  79 bpm     Heart Rate (Exercise)  104 bpm  102 bpm  98 bpm     Heart Rate (Exit)  75 bpm  94 bpm  87 bpm     Rating of Perceived Exertion (Exercise)  _0 Symptoms  -  -  leg and toe pain      Comments  First two weeks of exercise   -  -     Duration  Progress to 30 minutes of  aerobic without signs/symptoms of physical distress  Progress to 30 minutes of  aerobic without signs/symptoms of physical  distress  Progress to 30 minutes of  aerobic without signs/symptoms of physical distress     Intensity  THRR New 101-115-129  THRR unchanged  THRR unchanged       Progression   Progression  Continue to progress workloads to maintain intensity without signs/symptoms of physical distress.  Continue to progress workloads to maintain intensity without signs/symptoms of physical distress.  Continue to progress workloads to maintain intensity without signs/symptoms of physical distress.     Average METs  1.87  1.8  2.1       Resistance Training   Training Prescription  Yes  Yes  Yes     Weight  _1 Reps  10-15  10-15  10-15       Treadmill   MPH  1.1  1.1  - switched to AE due to leg and toe pain  Grade  0  0  -     Minutes  17  17  -     METs  1.84  1.8  -       NuStep   Level  _0 SPM  85  77  84     Minutes  _1 METs  1.9  1.8  2.1       Arm Ergometer   Level  -  -  1.5     Watts  -  -  12     Minutes  -  -  22     METs  -  -  2.1        Exercise Comments:  Exercise Comments    Row Name 03/13/18 1434 04/03/18 1445         Exercise Comments  Patient is doing well in the program, he has attended 9 sessions so far. He is working to get to feeling better, stronger and lose weight.   Patient continues to attend regularly. He has done well with his exercise especially after being switched from the TM to he Arm Ergometer. He is increasing his MET level daily.          Exercise Goals and Review:  Exercise Goals    Row Name 02/17/18 1417             Exercise Goals   Increase Physical Activity  Yes       Intervention  Provide advice, education, support and counseling about physical activity/exercise needs.;Develop an individualized exercise prescription for aerobic and resistive training based on initial evaluation findings, risk stratification, comorbidities and participant's personal goals.       Expected Outcomes  Short Term: Attend rehab on  a regular basis to increase amount of physical activity.;Long Term: Add in home exercise to make exercise part of routine and to increase amount of physical activity.;Long Term: Exercising regularly at least 3-5 days a week.       Increase Strength and Stamina  Yes       Intervention  Provide advice, education, support and counseling about physical activity/exercise needs.;Develop an individualized exercise prescription for aerobic and resistive training based on initial evaluation findings, risk stratification, comorbidities and participant's personal goals.       Expected Outcomes  Short Term: Increase workloads from initial exercise prescription for resistance, speed, and METs.;Short Term: Perform resistance training exercises routinely during rehab and add in resistance training at home;Long Term: Improve cardiorespiratory fitness, muscular endurance and strength as measured by increased METs and functional capacity (6MWT)       Able to understand and use rate of perceived exertion (RPE) scale  Yes       Intervention  Provide education and explanation on how to use RPE scale       Expected Outcomes  Short Term: Able to use RPE daily in rehab to express subjective intensity level;Long Term:  Able to use RPE to guide intensity level when exercising independently       Able to understand and use Dyspnea scale  Yes       Intervention  Provide education and explanation on how to use Dyspnea scale       Expected Outcomes  Short Term: Able to use Dyspnea scale daily in rehab to express subjective sense of shortness of breath during exertion;Long Term: Able to use Dyspnea scale to guide  intensity level when exercising independently       Knowledge and understanding of Target Heart Rate Range (THRR)  Yes       Intervention  Provide education and explanation of THRR including how the numbers were predicted and where they are located for reference       Expected Outcomes  Short Term: Able to state/look up  THRR;Short Term: Able to use daily as guideline for intensity in rehab;Long Term: Able to use THRR to govern intensity when exercising independently       Able to check pulse independently  Yes       Intervention  Provide education and demonstration on how to check pulse in carotid and radial arteries.;Review the importance of being able to check your own pulse for safety during independent exercise       Expected Outcomes  Short Term: Able to explain why pulse checking is important during independent exercise;Long Term: Able to check pulse independently and accurately       Understanding of Exercise Prescription  Yes       Intervention  Provide education, explanation, and written materials on patient's individual exercise prescription       Expected Outcomes  Short Term: Able to explain program exercise prescription;Long Term: Able to explain home exercise prescription to exercise independently          Exercise Goals Re-Evaluation : Exercise Goals Re-Evaluation    Row Name 03/13/18 1432 04/03/18 1444           Exercise Goal Re-Evaluation   Exercise Goals Review  Increase Physical Activity;Increase Strength and Stamina;Able to understand and use Dyspnea scale;Able to check pulse independently;Understanding of Exercise Prescription;Knowledge and understanding of Target Heart Rate Range (THRR);Able to understand and use rate of perceived exertion (RPE) scale  Increase Physical Activity;Increase Strength and Stamina;Able to understand and use Dyspnea scale;Able to check pulse independently;Understanding of Exercise Prescription;Knowledge and understanding of Target Heart Rate Range (THRR);Able to understand and use rate of perceived exertion (RPE) scale      Comments  Patient has done well in the program so far. He has tolerated the exercise well and is eager to get better. He has to watch his weight due to CHF and is strggling a bit with that. He is up to 1.22mh on the treadmill. We will continue  to progress him as tolerated.   Pt. has done well in the program so far. He has been experiencing some leg and toe pain so he has been set back a little but continues to push himself to get stronger.       Expected Outcomes  Feel better and have the ability to do more without getting tired.   Feel better and have the ability to do more without getting SOB          Discharge Exercise Prescription (Final Exercise Prescription Changes): Exercise Prescription Changes - 04/03/18 1400      Response to Exercise   Blood Pressure (Admit)  130/60    Blood Pressure (Exercise)  138/60    Blood Pressure (Exit)  120/60    Heart Rate (Admit)  79 bpm    Heart Rate (Exercise)  98 bpm    Heart Rate (Exit)  87 bpm    Rating of Perceived Exertion (Exercise)  14    Symptoms  leg and toe pain     Duration  Progress to 30 minutes of  aerobic without signs/symptoms of physical distress    Intensity  THRR  unchanged      Progression   Progression  Continue to progress workloads to maintain intensity without signs/symptoms of physical distress.    Average METs  2.1      Resistance Training   Training Prescription  Yes    Weight  3    Reps  10-15      Treadmill   MPH  --   switched to AE due to leg and toe pain     NuStep   Level  2    SPM  84    Minutes  22    METs  2.1      Arm Ergometer   Level  1.5    Watts  12    Minutes  22    METs  2.1       Nutrition:  Target Goals: Understanding of nutrition guidelines, daily intake of sodium <1520m, cholesterol <2031m calories 30% from fat and 7% or less from saturated fats, daily to have 5 or more servings of fruits and vegetables.  Biometrics: Pre Biometrics - 02/17/18 1418      Pre Biometrics   Height  5' 9" (1.753 m)    Weight  128.8 kg    Waist Circumference  58.5 inches    Hip Circumference  56.5 inches    Waist to Hip Ratio  1.04 %    BMI (Calculated)  41.91    Triceps Skinfold  12 mm    % Body Fat  41.6 %    Grip Strength  19.03  kg    Flexibility  0 in    Single Leg Stand  1.52 seconds        Nutrition Therapy Plan and Nutrition Goals: Nutrition Therapy & Goals - 03/03/18 1428      Personal Nutrition Goals   Nutrition Goal  For heart healthy choices add >50% of whole grains, make half their plate fruits and vegetables. Discuss the difference between starchy vegetables and leafy greens, and how leafy vegetables provide fiber, helps maintain healthy weight, helps control blood glucose, and lowers cholesterol.  Discuss purchasing fresh or frozen vegetable to reduce sodium and not to add grease, fat or sugar. Consume <18oz of red meat per week. Consume lean cuts of meats and very little of meats high in sodium and nitrates such as pork and lunch meats. Discussed portion control for all food groups.      Comments  Patient attended RD class 03/02/18.       Nutrition Assessments: Nutrition Assessments - 02/17/18 1448      MEDFICTS Scores   Pre Score  41       Nutrition Goals Re-Evaluation: Nutrition Goals Re-Evaluation    RoWoodruffame 03/13/18 1513 04/03/18 1453           Goals   Current Weight  274 lb (124.3 kg)  288 lb (130.6 kg)      Nutrition Goal  For heart healthy choices add >50% of whole grains, make half their plate fruits and vegetables. Discuss the difference between starchy vegetables and leafy greens, and how leafy vegetables provide fiber, helps maintain healthy weight, helps control blood glucose, and lowers cholesterol.  Discuss purchasing fresh or frozen vegetable to reduce sodium and not to add grease, fat or sugar. Consume <18oz of red meat per week. Consume lean cuts of meats and very little of meats high in sodium and nitrates such as pork and lunch meats. Discussed portion control for all food groups.  For heart healthy choices add >50% of whole grains, make half their plate fruits and vegetables. Discuss the difference between starchy vegetables and leafy greens, and how leafy vegetables  provide fiber, helps maintain healthy weight, helps control blood glucose, and lowers cholesterol.  Discuss purchasing fresh or frozen vegetable to reduce sodium and not to add grease, fat or sugar. Consume <18oz of red meat per week. Consume lean cuts of meats and very little of meats high in sodium and nitrates such as pork and lunch meats. Discussed portion control for all food groups.        Comment  Patient has lost 10 lbs since his initial visit. He says he is trying to eat healthier. He has met with RD. Will continue to monitor.   Patient has gained 14 lbs since last 30 day review. His weight does fluctuate. He says he is eating more now due to the holidays but he is also concerned with the possibility of fluid retention. He has gained 6 lbs in a week. He says he sees his PCP tomorrow and he is going to report the weight gain. Will continue to monitor for progress.       Expected Outcome  Patient will continue to work toward his nutritional goals.   Patient will continue to work toward his nutritional goals.          Nutrition Goals Discharge (Final Nutrition Goals Re-Evaluation): Nutrition Goals Re-Evaluation - 04/03/18 1453      Goals   Current Weight  288 lb (130.6 kg)    Nutrition Goal  For heart healthy choices add >50% of whole grains, make half their plate fruits and vegetables. Discuss the difference between starchy vegetables and leafy greens, and how leafy vegetables provide fiber, helps maintain healthy weight, helps control blood glucose, and lowers cholesterol.  Discuss purchasing fresh or frozen vegetable to reduce sodium and not to add grease, fat or sugar. Consume <18oz of red meat per week. Consume lean cuts of meats and very little of meats high in sodium and nitrates such as pork and lunch meats. Discussed portion control for all food groups.      Comment  Patient has gained 14 lbs since last 30 day review. His weight does fluctuate. He says he is eating more now due to the  holidays but he is also concerned with the possibility of fluid retention. He has gained 6 lbs in a week. He says he sees his PCP tomorrow and he is going to report the weight gain. Will continue to monitor for progress.     Expected Outcome  Patient will continue to work toward his nutritional goals.        Psychosocial: Target Goals: Acknowledge presence or absence of significant depression and/or stress, maximize coping skills, provide positive support system. Participant is able to verbalize types and ability to use techniques and skills needed for reducing stress and depression.  Initial Review & Psychosocial Screening: Initial Psych Review & Screening - 02/17/18 1445      Initial Review   Current issues with  None Identified      Family Dynamics   Good Support System?  Yes    Concerns  --   Would like to have a bewtter relationship with his sons.      Barriers   Psychosocial barriers to participate in program  There are no identifiable barriers or psychosocial needs.      Screening Interventions   Interventions  Encouraged to exercise  Expected Outcomes  Short Term goal: Identification and review with participant of any Quality of Life or Depression concerns found by scoring the questionnaire.;Long Term goal: The participant improves quality of Life and PHQ9 Scores as seen by post scores and/or verbalization of changes       Quality of Life Scores: Quality of Life - 02/17/18 1422      Quality of Life   Select  Quality of Life      Quality of Life Scores   Health/Function Pre  17.13 %    Socioeconomic Pre  19.07 %    Psych/Spiritual Pre  19.36 %    Family Pre  20.4 %    GLOBAL Pre  18.47 %      Scores of 19 and below usually indicate a poorer quality of life in these areas.  A difference of  2-3 points is a clinically meaningful difference.  A difference of 2-3 points in the total score of the Quality of Life Index has been associated with significant improvement in  overall quality of life, self-image, physical symptoms, and general health in studies assessing change in quality of life.  PHQ-9: Recent Review Flowsheet Data    Depression screen [facility] 2/9 02/17/2018   Decreased Interest 0   Down, Depressed, Hopeless 0   PHQ - 2 Score 0   Altered sleeping 1   Tired, decreased energy 2   Change in appetite 1   Feeling bad or failure about yourself  1   Trouble concentrating 1   Moving slowly or fidgety/restless 0   Suicidal thoughts 0   PHQ-9 Score 6   Difficult doing work/chores Somewhat difficult     Interpretation of Total Score  Total Score Depression Severity:  1-4 = Minimal depression, 5-9 = Mild depression, 10-14 = Moderate depression, 15-19 = Moderately severe depression, 20-27 = Severe depression   Psychosocial Evaluation and Intervention: Psychosocial Evaluation - 02/17/18 1446      Psychosocial Evaluation & Interventions   Interventions  Encouraged to exercise with the program and follow exercise prescription    Continue Psychosocial Services   No Follow up required       Psychosocial Re-Evaluation: Psychosocial Re-Evaluation    [place] Name 03/13/18 1516 04/03/18 1458           Psychosocial Re-Evaluation   Current issues with  None Identified  None Identified      Comments  Patient initial QOL score was 18.47/and his PHQ_9 score was 6 with no psychosocial barriers identified.   Patient initial QOL score was 18.47/and his PHQ_9 score was 6 with no psychosocial barriers identified.       Expected Outcomes  Patient will have no psychosocial issues identified at discharge with improved QOL and PHQ-9 scores.  Patient will have no psychosocial issues identified at discharge with improved QOL and PHQ-9 scores.      Interventions  Stress management education;Encouraged to attend Cardiac Rehabilitation for the exercise;Relaxation education  Stress management education;Encouraged to attend Cardiac Rehabilitation for the exercise;Relaxation  education      Continue Psychosocial Services   No Follow up required  No Follow up required         Psychosocial Discharge (Final Psychosocial Re-Evaluation): Psychosocial Re-Evaluation - 04/03/18 1458      Psychosocial Re-Evaluation   Current issues with  None Identified    Comments  Patient initial QOL score was 18.47/and his PHQ_9 score was 6 with no psychosocial barriers identified.     Expected Outcomes  Patient will have no psychosocial issues identified at discharge with improved QOL and PHQ-9 scores.    Interventions  Stress management education;Encouraged to attend Cardiac Rehabilitation for the exercise;Relaxation education    Continue Psychosocial Services   No Follow up required       Vocational Rehabilitation: Provide vocational rehab assistance to qualifying candidates.   Vocational Rehab Evaluation & Intervention: Vocational Rehab - 02/17/18 1449      Initial Vocational Rehab Evaluation & Intervention   Assessment shows need for Vocational Rehabilitation  No       Education: Education Goals: Education classes will be provided on a weekly basis, covering required topics. Participant will state understanding/return demonstration of topics presented.  Learning Barriers/Preferences: Learning Barriers/Preferences - 02/17/18 1448      Learning Barriers/Preferences   Learning Barriers  None    Learning Preferences  Group Instruction;Individual Instruction;Skilled Demonstration       Education Topics: Hypertension, Hypertension Reduction -Define heart disease and high blood pressure. Discus how high blood pressure affects the body and ways to reduce high blood pressure.   CARDIAC REHAB PHASE II EXERCISE from 03/29/2018 in [place]  Date  02/22/18  Educator  D. Coad  Instruction Review Code  2- Demonstrated Understanding      Exercise and Your Heart -Discuss why it is important to exercise, the FITT principles of exercise, normal and  abnormal responses to exercise, and how to exercise safely.   CARDIAC REHAB PHASE II EXERCISE from 03/29/2018 in [place]  Date  03/01/18  Educator  Coad  Instruction Review Code  2- Demonstrated Understanding      Angina -Discuss definition of angina, causes of angina, treatment of angina, and how to decrease risk of having angina.   CARDIAC REHAB PHASE II EXERCISE from 03/29/2018 in [place]  Date  03/08/18  Educator  Etheleen Mayhew   Instruction Review Code  2- Demonstrated Understanding      Cardiac Medications -Review what the following cardiac medications are used for, how they affect the body, and side effects that may occur when taking the medications.  Medications include Aspirin, Beta blockers, calcium channel blockers, ACE Inhibitors, angiotensin receptor blockers, diuretics, digoxin, and antihyperlipidemics.   CARDIAC REHAB PHASE II EXERCISE from 03/29/2018 in [place]  Date  03/15/18  Educator  Wynetta Emery  Instruction Review Code  2- Demonstrated Understanding      Congestive Heart Failure -Discuss the definition of CHF, how to live with CHF, the signs and symptoms of CHF, and how keep track of weight and sodium intake.   CARDIAC REHAB PHASE II EXERCISE from 03/29/2018 in [place]  Date  03/22/18  Educator  Wynetta Emery  Instruction Review Code  2- Demonstrated Understanding      Heart Disease and Intimacy -Discus the effect sexual activity has on the heart, how changes occur during intimacy as we age, and safety during sexual activity.   CARDIAC REHAB PHASE II EXERCISE from 03/29/2018 in [place]  Date  03/29/18  Educator  Wynetta Emery  Instruction Review Code  2- Demonstrated Understanding      Smoking Cessation / COPD -Discuss different methods to quit smoking, the health benefits of quitting smoking, and the definition of COPD.   Nutrition I:  Fats -Discuss the types of cholesterol, what cholesterol does to the heart, and how cholesterol levels can be controlled.   Nutrition II: Labels -Discuss the different components of food labels and  how to read food label   Heart Parts/Heart Disease and PAD -Discuss the anatomy of the heart, the pathway of blood circulation through the heart, and these are affected by heart disease.   Stress I: Signs and Symptoms -Discuss the causes of stress, how stress may lead to anxiety and depression, and ways to limit stress.   Stress II: Relaxation -Discuss different types of relaxation techniques to limit stress.   Warning Signs of Stroke / TIA -Discuss definition of a stroke, what the signs and symptoms are of a stroke, and how to identify when someone is having stroke.   Knowledge Questionnaire Score: Knowledge Questionnaire Score - 02/17/18 1448      Knowledge Questionnaire Score   Pre Score  21/24       Core Components/Risk Factors/Patient Goals at Admission: Personal Goals and Risk Factors at Admission - 02/17/18 1449      Core Components/Risk Factors/Patient Goals on Admission    Weight Management  Weight Loss    Personal Goal Other  Yes    Personal Goal  To feel better, be able to do more w/o getting tired.     Intervention  Attend CR 3 x week and supplement 2 x week with at home exercise    Expected Outcomes  Reach personal goals.        Core Components/Risk Factors/Patient Goals Review:  Goals and Risk Factor Review    Row Name 03/13/18 1514 04/03/18 1456           Core Components/Risk Factors/Patient Goals Review   Personal Goals Review  Weight Management/Obesity Feel better; do more without getting tired.   Weight Management/Obesity Feel better; do more w/o getting fatigue.       Review  Patient has completed 9 sessions losing 10 lbs since his initial visit. He is doing well in the program with some progression. He says he feels like his stamina has improved and  feels better overall. He is pleased with his progress so far and hopes to gain more as he continues.   Patient has completed 17 sessions gaining 14 lbs since last 30 day review. He is doing well in the program with some progression. He was taken off of the treadmill due to pain in his feet. He is not doing the nustep and eromogeter. He says he is feeling stronger and feels he is improving overall. Will continue to montior for progress.       Expected Outcomes  Patient will continue to attend sessions and complete the program meeting his personal goals.   Patient will continue to attend sessions and complete the program meeting his personal goals.          Core Components/Risk Factors/Patient Goals at Discharge (Final Review):  Goals and Risk Factor Review - 04/03/18 1456      Core Components/Risk Factors/Patient Goals Review   Personal Goals Review  Weight Management/Obesity   Feel better; do more w/o getting fatigue.    Review  Patient has completed 17 sessions gaining 14 lbs since last 30 day review. He is doing well in the program with some progression. He was taken off of the treadmill due to pain in his feet. He is not doing the nustep and eromogeter. He says he is feeling stronger and feels he is improving overall. Will continue to montior for progress.     Expected Outcomes  Patient will continue to attend sessions and complete the program meeting his personal goals.  ITP Comments: ITP Comments    Row Name 02/17/18 1319 02/20/18 1506         ITP Comments  Patient is coming to CR per Dr. Selinda Michaels request due to CHF. Had to delay starting due to needing pacemaker placement. He is eager to get started. He also inform us of his balance issues.   Patient new to the program. He has completed 2 sessions. Will continue to monitor for progress.          Comments: ITP REVIEW Patient doing well in the program. Will continue to monitor for progress.

## 2018-04-03 NOTE — Progress Notes (Signed)
Daily Session Note  Patient Details  Name: Noah [name] MRN: [id number] Date of Birth: 07/17/1940 Referring Provider:     [place] from 02/17/2018 in [place]  Referring Provider  Bronson Ing      Encounter Date: 04/03/2018  Check In: Session Check In - 04/03/18 1100      Check-In   Supervising physician immediately available to respond to emergencies  See telemetry face sheet for immediately available MD    Location  [facility]    Staff Present  Benay Pike, Exercise Physiologist;Mizraim Harmening Wynetta Emery, RN, BSN;Diane Coad, MS, EP, [facility], Exercise Physiologist    Medication changes reported      No    Fall or balance concerns reported     Yes    Comments  Patient has fallen 4 times in the past 12 months    Warm-up and Cool-down  Performed as group-led instruction    Resistance Training Performed  Yes    VAD Patient?  No    PAD/SET Patient?  No      Pain Assessment   Currently in Pain?  No/denies    Pain Score  0-No pain    Multiple Pain Sites  No       Capillary Blood Glucose: No results found for this or any previous visit (from the past 24 hour(s)).    Social History   Tobacco Use  Smoking Status Former Smoker  . Packs/day: 1.00  . Years: 30.00  . Pack years: 30.00  . Last attempt to quit: 09/01/2000  . Years since quitting: 17.5  Smokeless Tobacco Never Used    Goals Met:  Independence with exercise equipment Exercise tolerated well No report of cardiac concerns or symptoms Strength training completed today  Goals Unmet:  Not Applicable  Comments: Pt able to follow exercise prescription today without complaint.  Will continue to monitor for progression. Check out 1200.   Dr. Kate Sable is Medical Director for [facility] Cardiac and Pulmonary Rehab.

## 2018-04-04 ENCOUNTER — Ambulatory Visit (INDEPENDENT_AMBULATORY_CARE_PROVIDER_SITE_OTHER): Payer: Medicare Other | Admitting: Cardiovascular Disease

## 2018-04-04 ENCOUNTER — Encounter: Payer: Self-pay | Admitting: Cardiovascular Disease

## 2018-04-04 ENCOUNTER — Ambulatory Visit (INDEPENDENT_AMBULATORY_CARE_PROVIDER_SITE_OTHER): Payer: Medicare Other | Admitting: *Deleted

## 2018-04-04 VITALS — BP 142/67 | HR 81 | Ht 68.0 in | Wt 288.8 lb

## 2018-04-04 DIAGNOSIS — I10 Essential (primary) hypertension: Secondary | ICD-10-CM | POA: Diagnosis not present

## 2018-04-04 DIAGNOSIS — I4891 Unspecified atrial fibrillation: Secondary | ICD-10-CM | POA: Diagnosis not present

## 2018-04-04 DIAGNOSIS — I5042 Chronic combined systolic (congestive) and diastolic (congestive) heart failure: Secondary | ICD-10-CM

## 2018-04-04 DIAGNOSIS — Z5181 Encounter for therapeutic drug level monitoring: Secondary | ICD-10-CM | POA: Diagnosis not present

## 2018-04-04 DIAGNOSIS — N183 Chronic kidney disease, stage 3 unspecified: Secondary | ICD-10-CM

## 2018-04-04 DIAGNOSIS — Z955 Presence of coronary angioplasty implant and graft: Secondary | ICD-10-CM | POA: Diagnosis not present

## 2018-04-04 DIAGNOSIS — I5022 Chronic systolic (congestive) heart failure: Secondary | ICD-10-CM | POA: Diagnosis not present

## 2018-04-04 DIAGNOSIS — I259 Chronic ischemic heart disease, unspecified: Secondary | ICD-10-CM | POA: Diagnosis not present

## 2018-04-04 DIAGNOSIS — Z95810 Presence of automatic (implantable) cardiac defibrillator: Secondary | ICD-10-CM

## 2018-04-04 DIAGNOSIS — I480 Paroxysmal atrial fibrillation: Secondary | ICD-10-CM | POA: Diagnosis not present

## 2018-04-04 DIAGNOSIS — I25118 Atherosclerotic heart disease of native coronary artery with other forms of angina pectoris: Secondary | ICD-10-CM | POA: Diagnosis not present

## 2018-04-04 DIAGNOSIS — I255 Ischemic cardiomyopathy: Secondary | ICD-10-CM | POA: Diagnosis not present

## 2018-04-04 DIAGNOSIS — E785 Hyperlipidemia, unspecified: Secondary | ICD-10-CM

## 2018-04-04 LAB — POCT INR: INR: 3.7 — AB (ref 2.0–3.0)

## 2018-04-04 MED ORDER — FUROSEMIDE 40 MG PO TABS: ORAL_TABLET | ORAL | 6 refills | Status: DC

## 2018-04-04 NOTE — Patient Instructions (Signed)
Took coumadin this morning.   Hold coumadin tomorrow then decrease dose to 1/2 tablet daily except 1 tablet on Tuesdays Recheck in 1 week

## 2018-04-04 NOTE — Patient Instructions (Addendum)
Medication Instructions:   Increase Lasix to 80mg  every morning & 40mg  every evening.  Continue all other medications.    Labwork:  BMET - today.  Repeat BMET - due in 10-14 days.  Orders given today.   Office will contact with results via phone or letter.    Testing/Procedures: none  Follow-Up: 4 months   Any Other Special Instructions Will Be Listed Below (If Applicable).  If you need a refill on your cardiac medications before your next appointment, please call your pharmacy.

## 2018-04-04 NOTE — Progress Notes (Signed)
SUBJECTIVE: The patient presents for routine follow-up.  He underwent Saint Jude biventricular ICD implantation on 01/20/2018. He has coronary artery disease and chronic systolic heart failure with prior stent placement.  He underwent coronary angiography on 05/30/2006 and underwent percutaneous transluminal coronary angioplasty and drug-eluting stent placement to the circumflex. 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   Echocardiogram 01/09/2018 demonstrated severely reduced left ventricular systolic function, LVEF 25 to 16%, moderate LVH, diffuse hypokinesis, restrictive diastolic dysfunction, and moderately reduced right ventricular systolic function.  Nuclear stress test on 10/14/2017 showed a large prior inferior myocardial infarction.  There was no significant ischemia.  He denies chest pain.  He believes exertional dyspnea has improved to some degree.  He continues to have left leg swelling more so than right.  Labs on 01/17/2018 showed BUN 38 and creatinine 1.62.  He has been having issues with balance and walks with a cane.  He has tried physical therapy but this has not helped.  He ate a chicken pot pie last night.    Review of Systems: As per "subjective", otherwise negative.  Allergies  Allergen Reactions  . Codeine Other (See Comments)    constipation  . Erythromycin-Sulfisoxazole Other (See Comments)    Causes infection in throat and eyes    Current Outpatient Medications  Medication Sig Dispense Refill  . acetaminophen (TYLENOL) 650 MG CR tablet Take 1,300 mg by mouth at bedtime.    Marland Kitchen aspirin EC 81 MG tablet Take 1 tablet (81 mg total) by mouth daily. 90 tablet 3  . Coenzyme Q10 (COQ10) 100 MG CAPS Take 100 mg by mouth every evening.     . cyclobenzaprine (FLEXERIL) 10 MG tablet Take 10 mg by mouth 3 (three) times daily as needed for spasms.  3  . finasteride (PROSCAR) 5 MG  tablet Take 5 mg by mouth every evening.   4  . FLUoxetine (PROZAC) 20 MG capsule Take 20 mg by mouth every evening.    . furosemide (LASIX) 40 MG tablet Take 2 tabs (80mg ) by mouth every morning & 1 tab (40mg ) every evening (Patient taking differently: Take 80 mg by mouth daily. ) 30 tablet   . gabapentin (NEURONTIN) 100 MG capsule Take 100 mg by mouth 2 (two) times daily.     Marland Kitchen glipiZIDE (GLUCOTROL XL) 10 MG 24 hr tablet Take 10 mg by mouth 2 (two) times daily.    Marland Kitchen glucose blood (ONE TOUCH ULTRA TEST) test strip Check your sugars twice a day 100 each 12  . Insulin Glargine-Lixisenatide (SOLIQUA) 100-33 UNT-MCG/ML SOPN Inject 40 Units into the skin every evening.    . Insulin Pen Needle (NOVOFINE) 30G X 8 MM MISC Inject 10 each into the skin as needed. 90 each 3  . isosorbide mononitrate (IMDUR) 30 MG 24 hr tablet Take 30 mg by mouth every evening.   3  . metFORMIN (GLUCOPHAGE-XR) 500 MG 24 hr tablet Take 1,500 mg by mouth daily.    . metoprolol (TOPROL-XL) 200 MG 24 hr tablet Take 100 mg by mouth 2 (two) times daily.    . Multiple Vitamin (MULTIVITAMIN) capsule Take 1 capsule by mouth daily.     . Omega-3 Fatty Acids (FISH OIL) 1000 MG CAPS Take 1,000 mg by mouth 2 (two) times daily.     Marland Kitchen omeprazole (PRILOSEC) 20 MG capsule Take 20 mg by mouth daily.    Marland Kitchen  ranitidine (ZANTAC) 150 MG tablet Take 150 mg by mouth at bedtime.    . sacubitril-valsartan (ENTRESTO) 49-51 MG Take 1 tablet by mouth 2 (two) times daily. 60 tablet 6  . simvastatin (ZOCOR) 80 MG tablet TAKE 80 MG (1 TABLET) DAILY. (Patient taking differently: Take 80 mg by mouth every evening. TAKE 80 MG (1 TABLET) DAILY.) 90 tablet 3  . Tamsulosin HCl (FLOMAX) 0.4 MG CAPS Take 0.8 mg by mouth every evening.    . warfarin (COUMADIN) 5 MG tablet Take 1/2 tablet daily except 1 tablet on Tuesdays, Thursdays and Saturdays 45 tablet 3  . zolpidem (AMBIEN) 10 MG tablet Take 10 mg by mouth at bedtime.      No current facility-administered  medications for this visit.     Past Medical History:  Diagnosis Date  . CAD S/P percutaneous coronary angioplasty    remote PCIs in 1990's- last CFX PCI 2008  . Chronic kidney disease, stage III (moderate) (HCC)   . Diabetes mellitus with complication (HCC)    with hyperglycemia and diabetic autonomic poly neuropathy  . Gastro-esophageal reflux disease with esophagitis   . Hyperlipidemia   . Hypertension   . Ischemic cardiomyopathy 01/20/2018   St Jude ICD   . Morbid obesity (HCC)   . Obstructive sleep apnea    on CPAP  . Polyneuropathy   . Primary insomnia     Past Surgical History:  Procedure Laterality Date  . BIV ICD INSERTION CRT-D N/A 01/20/2018   Procedure: BIV ICD INSERTION CRT-D;  Surgeon: Marinus Maw, MD;  Location[facility] INVASIVE CV LAB;  Service: Cardiovascular;  Laterality: N/A;  . BIV PACEMAKER INSERTION CRT-P  01/20/2018   St Jude- Dr Ladona Ridgel  . CARDIAC CATHETERIZATION     multiple angioplasty X 8, 4 stents     Social History   Socioeconomic History  . Marital status: Married    Spouse name: Not on file  . Number of children: Not on file  . Years of education: Not on file  . Highest education level: Not on file  Occupational History  . Occupation: Retired  Engineer, production  . Financial resource strain: Not on file  . Food insecurity:    Worry: Not on file    Inability: Not on file  . Transportation needs:    Medical: Not on file    Non-medical: Not on file  Tobacco Use  . Smoking status: Former Smoker    Packs/day: 1.00    Years: 30.00    Pack years: 30.00    Last attempt to quit: 09/01/2000    Years since quitting: 17.6  . Smokeless tobacco: Never Used  Substance and Sexual Activity  . Alcohol use: Yes    Alcohol/week: 0.0 standard drinks  . Drug use: No  . Sexual activity: Not on file  Lifestyle  . Physical activity:    Days per week: Not on file    Minutes per session: Not on file  . Stress: Not on file  Relationships  . Social  connections:    Talks on phone: Not on file    Gets together: Not on file    Attends religious service: Not on file    Active member of club or organization: Not on file    Attends meetings of clubs or organizations: Not on file    Relationship status: Not on file  . Intimate partner violence:    Fear of current or ex partner: Not on file    Emotionally  abused: Not on file    Physically abused: Not on file    Forced sexual activity: Not on file  Other Topics Concern  . Not on file  Social History Narrative  . Not on file     Vitals:   04/04/18 1121  BP: (!) 142/67  Pulse: 81  SpO2: 97%  Weight: 288 lb 12.8 oz (131 kg)  Height: 5\' 8"  (1.727 m)    Wt Readings from Last 3 Encounters:  04/04/18 288 lb 12.8 oz (131 kg)  02/17/18 283 lb 15.2 oz (128.8 kg)  01/21/18 275 lb 5.7 oz (124.9 kg)     PHYSICAL EXAM General: NAD HEENT: Normal. Neck: No JVD, no thyromegaly. Lungs: Clear to auscultation bilaterally with normal respiratory effort. CV: Regular rate and rhythm, normal S1/S2, no S3/S4, no murmur.  Bilateral lower extremity edema, left greater than right.   Abdomen: Obese.  Neurologic: Alert and oriented.  Psych: Normal affect. Skin: Normal. Musculoskeletal: No gross deformities.    ECG: Reviewed above under Subjective   Labs:    Lipids: Lab Results  Component Value Date/Time   LDLCALC  09/09/2007 03:00 AM    75        Total Cholesterol/HDL:CHD Risk Coronary Heart Disease Risk Table                     Men   Women  1/2 Average Risk   3.4   3.3   CHOL  09/09/2007 03:00 AM    121        ATP III CLASSIFICATION:  <200     mg/dL   Desirable  409-811  mg/dL   Borderline High  >=914    mg/dL   High   TRIG 92 78/29/5621 03:00 AM   HDL 28 (L) 09/09/2007 03:00 AM       ASSESSMENT AND PLAN: 1. Coronary artery disease: History of inferior wall MI and multivessel stenting: Symptomatically stable.  Currently on aspirin 81 mg, Imdur, Entresto, Toprol-XL, and  simvastatin 80 mg. Lipids previously reviewed and LDL was not at goal.  Nuclear stress test reviewed above with no evidence of ischemia and large area of inferior myocardial scar.  2. Hypertension: Blood pressure is mildly elevated.  I will monitor given increased dose of diuretic.  3. Hyperlipidemia: Lipids previously reviewed. LDL not at goal. I increased simvastatin to 80 mg and will repeat lipids. He needs dietary modification and weight loss.  4. 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 He has not had significant weight loss but he has also been consuming a diet rich in sodium and fat.  He currently takes Lasix 80 mg.   I will increase to 80 mg every morning and 40 mg every afternoon.  I will obtain a basic metabolic panel now and repeat in 10 to 14 days.  If this does not help to alleviate leg edema, I will switch to torsemide 20 mg twice daily.  I emphasized the importance of weighing daily.  I also emphasized the importance of a low-sodium diet.  5.  Morbid obesity: He needs significant weight loss.  Historically, he has not adhered to a low-sodium, low-fat diet.  6.  Paroxysmal atrial fibrillation: Anticoagulated with warfarin.  13% burden seen with device interrogation on 01/31/2018.  Currently on Toprol-XL for rate control.  7.  Chronic kidney disease stage III: As I am increasing the dose of diuretic, I  will check a basic metabolic panel now and another 1 in 10 to 14 days.  BUN 30 and creatinine 1.62 on 01/17/2018.   Disposition: Follow up 4 months   Prentice Docker, M.D., F.A.C.C.

## 2018-04-05 ENCOUNTER — Encounter (HOSPITAL_COMMUNITY)
Admission: RE | Admit: 2018-04-05 | Discharge: 2018-04-05 | Disposition: A | Discharge status: Home / Self Care | Payer: Medicare Other | Source: Ambulatory Visit | Attending: Internal Medicine | Admitting: Internal Medicine

## 2018-04-05 DIAGNOSIS — I5022 Chronic systolic (congestive) heart failure: Secondary | ICD-10-CM

## 2018-04-05 NOTE — Progress Notes (Signed)
Daily Session Note  Patient Details  Name: Noah [name] MRN: [id number] Date of Birth: 1941/01/02 Referring Provider:     [place] from 02/17/2018 in [place]  Referring Provider  Bronson Ing      Encounter Date: 04/05/2018  Check In: Session Check In - 04/05/18 1100      Check-In   Supervising physician immediately available to respond to emergencies  See telemetry face sheet for immediately available MD    Location  [facility]    Staff Present  Benay Pike, Exercise Physiologist;Quanetta Truss Wynetta Emery, RN, BSN;Diane Coad, MS, EP, [facility], Exercise Physiologist    Medication changes reported      No    Fall or balance concerns reported     Yes    Comments  Patient has fallen 4 times in the past 12 months    Warm-up and Cool-down  Performed as group-led instruction    Resistance Training Performed  Yes    VAD Patient?  No    PAD/SET Patient?  No      Pain Assessment   Currently in Pain?  No/denies    Pain Score  0-No pain    Multiple Pain Sites  No       Capillary Blood Glucose: Results for orders placed or performed in visit on 04/04/18 (from the past 24 hour(s))  POCT INR     Status: Abnormal   Collection Time: 04/04/18 11:55 AM  Result Value Ref Range   INR 3.7 (A) 2.0 - 3.0      Social History   Tobacco Use  Smoking Status Former Smoker  . Packs/day: 1.00  . Years: 30.00  . Pack years: 30.00  . Last attempt to quit: 09/01/2000  . Years since quitting: 17.6  Smokeless Tobacco Never Used    Goals Met:  Independence with exercise equipment Exercise tolerated well No report of cardiac concerns or symptoms Strength training completed today  Goals Unmet:  Not Applicable  Comments: Pt able to follow exercise prescription today without complaint.  Will continue to monitor for progression. Check out 1200.   Dr. Kate Sable is Medical Director for [facility] Cardiac and Pulmonary Rehab.

## 2018-04-07 ENCOUNTER — Encounter (HOSPITAL_COMMUNITY)
Admission: RE | Admit: 2018-04-07 | Discharge: 2018-04-07 | Disposition: A | Discharge status: Home / Self Care | Payer: Medicare Other | Source: Ambulatory Visit | Attending: Internal Medicine | Admitting: Internal Medicine

## 2018-04-07 DIAGNOSIS — I5022 Chronic systolic (congestive) heart failure: Secondary | ICD-10-CM | POA: Diagnosis not present

## 2018-04-07 NOTE — Progress Notes (Signed)
Daily Session Note  Patient Details  Name: Noah [name] MRN: [id number] Date of Birth: 23-Dec-1940 Referring Provider:     [place] from 02/17/2018 in [place]  Referring Provider  Bronson Ing      Encounter Date: 04/07/2018  Check In: Session Check In - 04/07/18 1100      Check-In   Supervising physician immediately available to respond to emergencies  See telemetry face sheet for immediately available MD    Location  [facility]    Staff Present  Benay Pike, Exercise Physiologist;[place] Wynetta Emery, RN, BSN;Diane Coad, MS, EP, [facility], Exercise Physiologist    Medication changes reported      No    Fall or balance concerns reported     Yes    Comments  Patient has fallen 4 times in the past 12 months    Warm-up and Cool-down  Performed as group-led instruction    Resistance Training Performed  Yes    VAD Patient?  No    PAD/SET Patient?  No      Pain Assessment   Currently in Pain?  No/denies    Pain Score  0-No pain    Multiple Pain Sites  No       Capillary Blood Glucose: No results found for this or any previous visit (from the past 24 hour(s)).    Social History   Tobacco Use  Smoking Status Former Smoker  . Packs/day: 1.00  . Years: 30.00  . Pack years: 30.00  . Last attempt to quit: 09/01/2000  . Years since quitting: 17.6  Smokeless Tobacco Never Used    Goals Met:  Independence with exercise equipment Exercise tolerated well No report of cardiac concerns or symptoms Strength training completed today  Goals Unmet:  Not Applicable  Comments: Pt able to follow exercise prescription today without complaint.  Will continue to monitor for progression. Check out 1200.   Dr. Kate Sable is Medical Director for [facility] Cardiac and Pulmonary Rehab.

## 2018-04-10 ENCOUNTER — Encounter (HOSPITAL_COMMUNITY): Admission: RE | Admit: 2018-04-10 | Payer: Medicare Other | Source: Ambulatory Visit

## 2018-04-12 ENCOUNTER — Encounter (HOSPITAL_COMMUNITY): Payer: Medicare Other

## 2018-04-14 ENCOUNTER — Encounter (HOSPITAL_COMMUNITY): Payer: Medicare Other

## 2018-04-17 ENCOUNTER — Encounter (HOSPITAL_COMMUNITY)
Admission: RE | Admit: 2018-04-17 | Discharge: 2018-04-17 | Disposition: A | Discharge status: Home / Self Care | Payer: Medicare Other | Source: Ambulatory Visit | Attending: Internal Medicine | Admitting: Internal Medicine

## 2018-04-17 ENCOUNTER — Ambulatory Visit: Payer: Medicare Other | Admitting: Cardiovascular Disease

## 2018-04-17 DIAGNOSIS — I5022 Chronic systolic (congestive) heart failure: Secondary | ICD-10-CM | POA: Diagnosis not present

## 2018-04-17 DIAGNOSIS — B351 Tinea unguium: Secondary | ICD-10-CM | POA: Diagnosis not present

## 2018-04-17 DIAGNOSIS — L110 Acquired keratosis follicularis: Secondary | ICD-10-CM | POA: Diagnosis not present

## 2018-04-17 DIAGNOSIS — E1140 Type 2 diabetes mellitus with diabetic neuropathy, unspecified: Secondary | ICD-10-CM | POA: Diagnosis not present

## 2018-04-17 NOTE — Progress Notes (Signed)
Daily Session Note  Patient Details  Name: Noah [name] MRN: [id number] Date of Birth: [birth year] Referring Provider:     [place] from 02/17/2018 in [place]  Referring Provider  Bronson Ing      Encounter Date: 04/17/2018  Check In: Session Check In - 04/17/18 1100      Check-In   Supervising physician immediately available to respond to emergencies  See telemetry face sheet for immediately available MD    Location  [facility]    Staff Present  Benay Pike, Exercise Physiologist;Diane Coad, MS, EP, [facility], Exercise Physiologist;Other    Medication changes reported      No    Fall or balance concerns reported     Yes    Comments  Patient has fallen 4 times in the past 12 months    Warm-up and Cool-down  Performed as group-led instruction    Resistance Training Performed  Yes    VAD Patient?  No    PAD/SET Patient?  No      Pain Assessment   Currently in Pain?  No/denies    Pain Score  0-No pain    Multiple Pain Sites  No       Capillary Blood Glucose: No results found for this or any previous visit (from the past 24 hour(s)).    Social History   Tobacco Use  Smoking Status Former Smoker  . Packs/day: 1.00  . Years: 30.00  . Pack years: 30.00  . Last attempt to quit: 09/01/2000  . Years since quitting: 17.6  Smokeless Tobacco Never Used    Goals Met:  Independence with exercise equipment Exercise tolerated well No report of cardiac concerns or symptoms Strength training completed today  Goals Unmet:  Not Applicable  Comments: Pt able to follow exercise prescription today without complaint.  Will continue to monitor for progression. Check out 1200.   Dr. Kate Sable is Medical Director for [facility] Cardiac and Pulmonary Rehab.

## 2018-04-19 ENCOUNTER — Encounter (HOSPITAL_COMMUNITY): Payer: Medicare HMO

## 2018-04-20 ENCOUNTER — Ambulatory Visit (INDEPENDENT_AMBULATORY_CARE_PROVIDER_SITE_OTHER): Payer: Medicare Other | Admitting: Pharmacist

## 2018-04-20 DIAGNOSIS — I255 Ischemic cardiomyopathy: Secondary | ICD-10-CM

## 2018-04-20 DIAGNOSIS — Z5181 Encounter for therapeutic drug level monitoring: Secondary | ICD-10-CM

## 2018-04-20 DIAGNOSIS — I5022 Chronic systolic (congestive) heart failure: Secondary | ICD-10-CM | POA: Diagnosis not present

## 2018-04-20 DIAGNOSIS — I4891 Unspecified atrial fibrillation: Secondary | ICD-10-CM

## 2018-04-20 LAB — POCT INR: INR: 3.8 — AB (ref 2.0–3.0)

## 2018-04-20 NOTE — Patient Instructions (Signed)
Description   Took coumadin this morning.   Hold coumadin tomorrow then decrease dose to 1/2 tablet daily except 1 tablet on Tuesdays Recheck in 1 week

## 2018-04-21 ENCOUNTER — Encounter (HOSPITAL_COMMUNITY)
Admission: RE | Admit: 2018-04-21 | Discharge: 2018-04-21 | Disposition: A | Discharge status: Home / Self Care | Payer: Medicare HMO | Source: Ambulatory Visit | Attending: Internal Medicine | Admitting: Internal Medicine

## 2018-04-21 DIAGNOSIS — I5022 Chronic systolic (congestive) heart failure: Secondary | ICD-10-CM | POA: Insufficient documentation

## 2018-04-21 NOTE — Progress Notes (Signed)
Daily Session Note  Patient Details  Name: Noah [name] MRN: [id number] Date of Birth: [birth year] Referring Provider:     [facility] from 02/17/2018 in [place]  Referring Provider  Bronson Ing      Encounter Date: 04/21/2018  Check In: Session Check In - 04/21/18 1545      Check-In   Supervising physician immediately available to respond to emergencies  See telemetry face sheet for immediately available MD    Location  [facility]    Staff Present  Benay Pike, Exercise Physiologist;Diane Coad, MS, EP, CHC, Exercise Physiologist;Trinitey Roache Wynetta Emery, RN, BSN    Medication changes reported      No    Fall or balance concerns reported     Yes    Comments  Patient has fallen 4 times in the past 12 months    Warm-up and Cool-down  Performed as group-led instruction    Resistance Training Performed  Yes    VAD Patient?  No    PAD/SET Patient?  No      Pain Assessment   Currently in Pain?  No/denies    Pain Score  0-No pain    Multiple Pain Sites  No       Capillary Blood Glucose: No results found for this or any previous visit (from the past 24 hour(s)).    Social History   Tobacco Use  Smoking Status Former Smoker  . Packs/day: 1.00  . Years: 30.00  . Pack years: 30.00  . Last attempt to quit: 09/01/2000  . Years since quitting: 17.6  Smokeless Tobacco Never Used    Goals Met:  Independence with exercise equipment Exercise tolerated well No report of cardiac concerns or symptoms Strength training completed today  Goals Unmet:  Not Applicable  Comments: Pt able to follow exercise prescription today without complaint.  Will continue to monitor for progression. Check out 1645.   Dr. Kate Sable is Medical Director for [facility].

## 2018-04-24 ENCOUNTER — Encounter (HOSPITAL_COMMUNITY): Payer: Medicare HMO

## 2018-04-25 ENCOUNTER — Encounter: Payer: Self-pay | Admitting: Internal Medicine

## 2018-04-25 ENCOUNTER — Ambulatory Visit (INDEPENDENT_AMBULATORY_CARE_PROVIDER_SITE_OTHER): Payer: Medicare HMO | Admitting: Internal Medicine

## 2018-04-25 VITALS — BP 130/72 | HR 81 | Ht 68.0 in | Wt 289.0 lb

## 2018-04-25 DIAGNOSIS — I5042 Chronic combined systolic (congestive) and diastolic (congestive) heart failure: Secondary | ICD-10-CM | POA: Diagnosis not present

## 2018-04-25 DIAGNOSIS — Z95810 Presence of automatic (implantable) cardiac defibrillator: Secondary | ICD-10-CM | POA: Diagnosis not present

## 2018-04-25 NOTE — Progress Notes (Signed)
HPI [name] returns today for ongoing evaluation and management of his BiV ICD. He is a pleasant 78 yo man with a longstanding h/o CHF.  No syncope, chest pain but he does have class 3 symptoms and peripheral edema. Since his device was placed, he has continued to gain weight. He admits to dietary indiscretion. His weight is up 7 lbs. He has not had any ICD shocks.  Allergies  Allergen Reactions  . Codeine Other (See Comments)    constipation  . Erythromycin-Sulfisoxazole Other (See Comments)    Causes infection in throat and eyes     Current Outpatient Medications  Medication Sig Dispense Refill  . acetaminophen (TYLENOL) 650 MG CR tablet Take 1,300 mg by mouth at bedtime.    Marland Kitchen aspirin EC 81 MG tablet Take 1 tablet (81 mg total) by mouth daily. 90 tablet 3  . Coenzyme Q10 (COQ10) 100 MG CAPS Take 100 mg by mouth every evening.     . cyclobenzaprine (FLEXERIL) 10 MG tablet Take 10 mg by mouth 3 (three) times daily as needed for spasms.  3  . finasteride (PROSCAR) 5 MG tablet Take 5 mg by mouth every evening.   4  . FLUoxetine (PROZAC) 20 MG capsule Take 20 mg by mouth every evening.    . furosemide (LASIX) 40 MG tablet Take 2 tabs (80mg ) by mouth every morning & 1 tab (40mg ) every evening 90 tablet 6  . gabapentin (NEURONTIN) 100 MG capsule Take 100 mg by mouth 2 (two) times daily.     Marland Kitchen glipiZIDE (GLUCOTROL XL) 10 MG 24 hr tablet Take 10 mg by mouth 2 (two) times daily.    Marland Kitchen glucose blood (ONE TOUCH ULTRA TEST) test strip Check your sugars twice a day 100 each 12  . Insulin Glargine-Lixisenatide (SOLIQUA) 100-33 UNT-MCG/ML SOPN Inject 40 Units into the skin every evening.    . Insulin Pen Needle (NOVOFINE) 30G X 8 MM MISC Inject 10 each into the skin as needed. 90 each 3  . isosorbide mononitrate (IMDUR) 30 MG 24 hr tablet Take 30 mg by mouth every evening.   3  . metFORMIN (GLUCOPHAGE-XR) 500 MG 24 hr tablet Take 1,500 mg by mouth daily.    . metoprolol (TOPROL-XL) 200 MG 24  hr tablet Take 100 mg by mouth 2 (two) times daily.    . Multiple Vitamin (MULTIVITAMIN) capsule Take 1 capsule by mouth daily.     . Omega-3 Fatty Acids (FISH OIL) 1000 MG CAPS Take 1,000 mg by mouth 2 (two) times daily.     Marland Kitchen omeprazole (PRILOSEC) 20 MG capsule Take 20 mg by mouth daily.    . ranitidine (ZANTAC) 150 MG tablet Take 150 mg by mouth at bedtime.    . sacubitril-valsartan (ENTRESTO) 49-51 MG Take 1 tablet by mouth 2 (two) times daily. 60 tablet 6  . simvastatin (ZOCOR) 80 MG tablet TAKE 80 MG (1 TABLET) DAILY. (Patient taking differently: Take 80 mg by mouth every evening. TAKE 80 MG (1 TABLET) DAILY.) 90 tablet 3  . Tamsulosin HCl (FLOMAX) 0.4 MG CAPS Take 0.8 mg by mouth every evening.    . warfarin (COUMADIN) 5 MG tablet Take 1/2 tablet daily except 1 tablet on Tuesdays, Thursdays and Saturdays 45 tablet 3  . zolpidem (AMBIEN) 10 MG tablet Take 10 mg by mouth at bedtime.      No current facility-administered medications for this visit.      Past Medical History:  Diagnosis Date  .  CAD S/P percutaneous coronary angioplasty    remote PCIs in 1990's- last CFX PCI 2008  . Chronic kidney disease, stage III (moderate) (HCC)   . Diabetes mellitus with complication (HCC)    with hyperglycemia and diabetic autonomic poly neuropathy  . Gastro-esophageal reflux disease with esophagitis   . Hyperlipidemia   . Hypertension   . Ischemic cardiomyopathy 01/20/2018   St Jude ICD   . Morbid obesity (HCC)   . Obstructive sleep apnea    on CPAP  . Polyneuropathy   . Primary insomnia     ROS:   All systems reviewed and negative except as noted in the HPI.   Past Surgical History:  Procedure Laterality Date  . BIV ICD INSERTION CRT-D N/A 01/20/2018   Procedure: BIV ICD INSERTION CRT-D;  Surgeon: Marinus Maw, MD;  Location: [facility] INVASIVE CV LAB;  Service: Cardiovascular;  Laterality: N/A;  . BIV PACEMAKER INSERTION CRT-P  01/20/2018   St Jude- Dr Ladona Ridgel  . CARDIAC  CATHETERIZATION     multiple angioplasty X 8, 4 stents      Family History  Problem Relation Age of Onset  . Heart attack Father   . Breast cancer Mother   . CAD Mother   . Cancer Brother      Social History   Socioeconomic History  . Marital status: Married    Spouse name: Not on file  . Number of children: Not on file  . Years of education: Not on file  . Highest education level: Not on file  Occupational History  . Occupation: Retired  Engineer, production  . Financial resource strain: Not on file  . Food insecurity:    Worry: Not on file    Inability: Not on file  . Transportation needs:    Medical: Not on file    Non-medical: Not on file  Tobacco Use  . Smoking status: Former Smoker    Packs/day: 1.00    Years: 30.00    Pack years: 30.00    Last attempt to quit: 09/01/2000    Years since quitting: 17.6  . Smokeless tobacco: Never Used  Substance and Sexual Activity  . Alcohol use: Yes    Alcohol/week: 0.0 standard drinks  . Drug use: No  . Sexual activity: Not on file  Lifestyle  . Physical activity:    Days per week: Not on file    Minutes per session: Not on file  . Stress: Not on file  Relationships  . Social connections:    Talks on phone: Not on file    Gets together: Not on file    Attends religious service: Not on file    Active member of club or organization: Not on file    Attends meetings of clubs or organizations: Not on file    Relationship status: Not on file  . Intimate partner violence:    Fear of current or ex partner: Not on file    Emotionally abused: Not on file    Physically abused: Not on file    Forced sexual activity: Not on file  Other Topics Concern  . Not on file  Social History Narrative  . Not on file     BP 130/72   Pulse 81   Ht 5\' 8"  (1.727 m)   Wt 289 lb (131.1 kg)   BMI 43.94 kg/m   Physical Exam:  Mor bidly obese appearing NAD HEENT: Unremarkable Neck:  6 cm JVD, no thyromegally Lymphatics:  No  adenopathy Back:  No CVA tenderness Lungs:  Clear with no wheezes HEART:  Regular rate rhythm, no murmurs, no rubs, no clicks Abd:  soft, positive bowel sounds, no organomegally, no rebound, no guarding Ext:  2 plus pulses, no edema, no cyanosis, no clubbing Skin:  No rashes no nodules Neuro:  CN II through XII intact, motor grossly intact  EKG - nsr with biv pacing  DEVICE  Normal device function.  See PaceArt for details.   Assess/Plan: 1. Chronic systolic heart failure - his symptoms are class 2. He is limited by his obesity. He is encouraged to lose weight and maintain a low sodium diet. 2. ICD - his St. Jude Biv ICD is working normally.  3. Obesity - his weigh is up 8 lbs in the past 5 months. He is encouraged to lose weight.   Lewayne Bunting, M.D.

## 2018-04-25 NOTE — Patient Instructions (Addendum)
Medication Instructions:  Your physician recommends that you continue on your current medications as directed. Please refer to the Current Medication list given to you today.  Labwork: None ordered.  Testing/Procedures: None ordered.  Follow-Up: Your physician wants you to follow-up in: 9 months with Dr. Ladona Ridgel in [place].   You will receive a reminder letter in the mail two months in advance. If you don't receive a letter, please call our office to schedule the follow-up appointment.  Remote monitoring is used to monitor your ICD from home. This monitoring reduces the number of office visits required to check your device to one time per year. It allows [place] to keep an eye on the functioning of your device to ensure it is working properly. You are scheduled for a device check from home on 07/25/2018. You may send your transmission at any time that day. If you have a wireless device, the transmission will be sent automatically. After your physician reviews your transmission, you will receive a postcard with your next transmission date.  Any Other Special Instructions Will Be Listed Below (If Applicable).  If you need a refill on your cardiac medications before your next appointment, please call your pharmacy.

## 2018-04-26 ENCOUNTER — Encounter (HOSPITAL_COMMUNITY)
Admission: RE | Admit: 2018-04-26 | Discharge: 2018-04-26 | Disposition: A | Discharge status: Home / Self Care | Payer: Medicare HMO | Source: Ambulatory Visit | Attending: Internal Medicine | Admitting: Internal Medicine

## 2018-04-26 DIAGNOSIS — I5022 Chronic systolic (congestive) heart failure: Secondary | ICD-10-CM

## 2018-04-26 LAB — CUP PACEART INCLINIC DEVICE CHECK
Date Time Interrogation Session: 20200108085947
Implantable Lead Implant Date: 20191004
Implantable Lead Implant Date: 20191004
Implantable Lead Implant Date: 20191004
Implantable Lead Location: 753859
Implantable Pulse Generator Implant Date: 20191004
MDC IDC LEAD LOCATION: 753858
MDC IDC LEAD LOCATION: 753860
Pulse Gen Serial Number: 9810994

## 2018-04-26 NOTE — Progress Notes (Signed)
Daily Session Note  Patient Details  Name: KAMAL JURGENS MRN: [id number] Date of Birth: 13-Mar-1941 Referring Provider:     [facility] from 02/17/2018 in [place]  Referring Provider  Bronson Ing      Encounter Date: 04/26/2018  Check In: Session Check In - 04/26/18 1100      Check-In   Supervising physician immediately available to respond to emergencies  See telemetry face sheet for immediately available MD    Location  [facility]    Staff Present  Benay Pike, Exercise Physiologist;Diane Coad, MS, EP, CHC, Exercise Physiologist;Debra Wynetta Emery, RN, BSN    Medication changes reported      No    Fall or balance concerns reported     Yes    Comments  Patient has fallen 4 times in the past 12 months    Warm-up and Cool-down  Performed as group-led instruction    Resistance Training Performed  Yes    VAD Patient?  No    PAD/SET Patient?  No      Pain Assessment   Currently in Pain?  No/denies    Pain Score  0-No pain    Multiple Pain Sites  No       Capillary Blood Glucose: No results found for this or any previous visit (from the past 24 hour(s)).    Social History   Tobacco Use  Smoking Status Former Smoker  . Packs/day: 1.00  . Years: 30.00  . Pack years: 30.00  . Last attempt to quit: 09/01/2000  . Years since quitting: 17.6  Smokeless Tobacco Never Used    Goals Met:  Independence with exercise equipment Exercise tolerated well No report of cardiac concerns or symptoms Strength training completed today  Goals Unmet:  Not Applicable  Comments: Pt able to follow exercise prescription today without complaint.  Will continue to monitor for progression. Check out 1200.   Dr. Kate Sable is Medical Director for [facility].

## 2018-04-27 NOTE — Progress Notes (Signed)
Cardiac Individual Treatment Plan  Patient Details  Name: Noah [name] MRN: [id number] Date of Birth: [birth year] Referring Provider:     [facility] from 02/17/2018 in [facility]  Referring Provider  Bronson Ing      Initial Encounter Date:    CARDIAC REHAB PHASE II ORIENTATION from 02/17/2018 in [facility]  Date  02/17/18      Visit Diagnosis: CHF (congestive heart failure), NYHA class I, chronic, systolic (HCC)  Patient's Home Medications on Admission:  Current Outpatient Medications:  .  acetaminophen (TYLENOL) 650 MG CR tablet, Take 1,300 mg by mouth at bedtime., Disp: , Rfl:  .  aspirin EC 81 MG tablet, Take 1 tablet (81 mg total) by mouth daily., Disp: 90 tablet, Rfl: 3 .  Coenzyme Q10 (COQ10) 100 MG CAPS, Take 100 mg by mouth every evening. , Disp: , Rfl:  .  cyclobenzaprine (FLEXERIL) 10 MG tablet, Take 10 mg by mouth 3 (three) times daily as needed for spasms., Disp: , Rfl: 3 .  finasteride (PROSCAR) 5 MG tablet, Take 5 mg by mouth every evening. , Disp: , Rfl: 4 .  FLUoxetine (PROZAC) 20 MG capsule, Take 20 mg by mouth every evening., Disp: , Rfl:  .  furosemide (LASIX) 40 MG tablet, Take 2 tabs (57m) by mouth every morning & 1 tab (456m every evening, Disp: 90 tablet, Rfl: 6 .  gabapentin (NEURONTIN) 100 MG capsule, Take 100 mg by mouth 2 (two) times daily. , Disp: , Rfl:  .  glipiZIDE (GLUCOTROL XL) 10 MG 24 hr tablet, Take 10 mg by mouth 2 (two) times daily., Disp: , Rfl:  .  glucose blood (ONE TOUCH ULTRA TEST) test strip, Check your sugars twice a day, Disp: 100 each, Rfl: 12 .  Insulin Glargine-Lixisenatide (SOLIQUA) 100-33 UNT-MCG/ML SOPN, Inject 40 Units into the skin every evening., Disp: , Rfl:  .  Insulin Pen Needle (NOVOFINE) 30G X 8 MM MISC, Inject 10 each into the skin as needed., Disp: 90 each, Rfl: 3 .  isosorbide mononitrate (IMDUR) 30 MG 24 hr tablet, Take 30 mg by mouth every evening. ,  Disp: , Rfl: 3 .  metFORMIN (GLUCOPHAGE-XR) 500 MG 24 hr tablet, Take 1,500 mg by mouth daily., Disp: , Rfl:  .  metoprolol (TOPROL-XL) 200 MG 24 hr tablet, Take 100 mg by mouth 2 (two) times daily., Disp: , Rfl:  .  Multiple Vitamin (MULTIVITAMIN) capsule, Take 1 capsule by mouth daily. , Disp: , Rfl:  .  Omega-3 Fatty Acids (FISH OIL) 1000 MG CAPS, Take 1,000 mg by mouth 2 (two) times daily. , Disp: , Rfl:  .  omeprazole (PRILOSEC) 20 MG capsule, Take 20 mg by mouth daily., Disp: , Rfl:  .  ranitidine (ZANTAC) 150 MG tablet, Take 150 mg by mouth at bedtime., Disp: , Rfl:  .  sacubitril-valsartan (ENTRESTO) 49-51 MG, Take 1 tablet by mouth 2 (two) times daily., Disp: 60 tablet, Rfl: 6 .  simvastatin (ZOCOR) 80 MG tablet, TAKE 80 MG (1 TABLET) DAILY. (Patient taking differently: Take 80 mg by mouth every evening. TAKE 80 MG (1 TABLET) DAILY.), Disp: 90 tablet, Rfl: 3 .  Tamsulosin HCl (FLOMAX) 0.4 MG CAPS, Take 0.8 mg by mouth every evening., Disp: , Rfl:  .  warfarin (COUMADIN) 5 MG tablet, Take 1/2 tablet daily except 1 tablet on Tuesdays, Thursdays and Saturdays, Disp: 45 tablet, Rfl: 3 .  zolpidem (AMBIEN) 10 MG tablet, Take 10 mg by  mouth at bedtime. , Disp: , Rfl:   Past Medical History: Past Medical History:  Diagnosis Date  . CAD S/P percutaneous coronary angioplasty    remote PCIs in 1990's- last CFX PCI 2008  . Chronic kidney disease, stage III (moderate) (HCC)   . Diabetes mellitus with complication (HCC)    with hyperglycemia and diabetic autonomic poly neuropathy  . Gastro-esophageal reflux disease with esophagitis   . Hyperlipidemia   . Hypertension   . Ischemic cardiomyopathy 01/20/2018   St Jude ICD   . Morbid obesity ([place])   . Obstructive sleep apnea    on CPAP  . Polyneuropathy   . Primary insomnia     Tobacco Use: Social History   Tobacco Use  Smoking Status Former Smoker  . Packs/day: 1.00  . Years: 30.00  . Pack years: 30.00  . Last attempt to quit:  09/01/2000  . Years since quitting: 17.6  Smokeless Tobacco Never Used    Labs: Recent Review Scientist, physiological    Labs for ITP Cardiac and Pulmonary Rehab 09/09/2007   Cholestrol 121 ATP III CLASSIFICATION: <200     mg/dL   Desirable 200-239  mg/dL   Borderline High >=240    mg/dL   High   LDLCALC 75 Total Cholesterol/HDL:CHD Risk Coronary Heart Disease Risk Table Men   Women 1/2 Average Risk   3.4   3.3   HDL 28(L)   Trlycerides 92      Capillary Blood Glucose: Lab Results  Component Value Date   GLUCAP 224 (H) 01/21/2018   GLUCAP 214 (H) 01/21/2018   GLUCAP 252 (H) 01/21/2018   GLUCAP 186 (H) 01/20/2018   GLUCAP 92 01/20/2018     Exercise Target Goals: Exercise Program Goal: Individual exercise prescription set using results from initial 6 min walk test and THRR while considering  patient's activity barriers and safety.   Exercise Prescription Goal: Starting with aerobic activity 30 plus minutes a day, 3 days per week for initial exercise prescription. Provide home exercise prescription and guidelines that participant acknowledges understanding prior to discharge.  Activity Barriers & Risk Stratification: Activity Barriers & Cardiac Risk Stratification - 02/17/18 1415      Activity Barriers & Cardiac Risk Stratification   Activity Barriers  Deconditioning;Muscular Weakness;Shortness of Breath;History of Falls;Balance Concerns    Cardiac Risk Stratification  High       6 Minute Walk: 6 Minute Walk    Row Name 02/17/18 1413         6 Minute Walk   Phase  Initial     Distance  650 feet     Walk Time  6 minutes     # of Rest Breaks  0     MPH  1.23     METS  1.94     RPE  15     Perceived Dyspnea   13     VO2 Peak  2.11     Symptoms  Yes (comment)     Comments  3/10 back pain while walking     Resting HR  76 bpm     Resting BP  140/76     Resting Oxygen Saturation   95 %     Exercise Oxygen Saturation  during 6 min walk  90 %     Max Ex. HR  92 bpm      Max Ex. BP  162/78     2 Minute Post BP  136/76  Oxygen Initial Assessment:   Oxygen Re-Evaluation:   Oxygen Discharge (Final Oxygen Re-Evaluation):   Initial Exercise Prescription: Initial Exercise Prescription - 02/17/18 1400      Date of Initial Exercise RX and Referring Provider   Date  02/17/18    Referring Provider  Koneswaran    Expected Discharge Date  05/20/17      Treadmill   MPH  0.8    Grade  0    Minutes  17    METs  1.61      NuStep   Level  1    SPM  65    Minutes  17    METs  1.8      Prescription Details   Frequency (times per week)  3    Duration  Progress to 30 minutes of continuous aerobic without signs/symptoms of physical distress      Intensity   THRR 40-80% of Max Heartrate  101-115-129    Ratings of Perceived Exertion  11-13    Perceived Dyspnea  0-4      Progression   Progression  Continue to progress workloads to maintain intensity without signs/symptoms of physical distress.      Resistance Training   Training Prescription  Yes    Weight  1    Reps  10-15       Perform Capillary Blood Glucose checks as needed.  Exercise Prescription Changes:  Exercise Prescription Changes    Row Name 03/09/18 0700 03/21/18 1000 04/03/18 1400 04/18/18 1200       Response to Exercise   Blood Pressure (Admit)  136/70  124/68  130/60  124/66    Blood Pressure (Exercise)  148/62  150/70  138/60  140/70    Blood Pressure (Exit)  124/72  120/54  120/60  120/66    Heart Rate (Admit)  90 bpm  86 bpm  79 bpm  88 bpm    Heart Rate (Exercise)  104 bpm  102 bpm  98 bpm  101 bpm    Heart Rate (Exit)  75 bpm  94 bpm  87 bpm  95 bpm    Rating of Perceived Exertion (Exercise)  '13  13  14  16    ' Symptoms  -  -  leg and toe pain   -    Comments  First two weeks of exercise   -  -  -    Duration  Progress to 30 minutes of  aerobic without signs/symptoms of physical distress  Progress to 30 minutes of  aerobic without signs/symptoms of physical  distress  Progress to 30 minutes of  aerobic without signs/symptoms of physical distress  Progress to 30 minutes of  aerobic without signs/symptoms of physical distress    Intensity  THRR New 101-115-129  THRR unchanged  THRR unchanged  THRR unchanged      Progression   Progression  Continue to progress workloads to maintain intensity without signs/symptoms of physical distress.  Continue to progress workloads to maintain intensity without signs/symptoms of physical distress.  Continue to progress workloads to maintain intensity without signs/symptoms of physical distress.  Continue to progress workloads to maintain intensity without signs/symptoms of physical distress.    Average METs  1.87  1.8  2.1  2      Resistance Training   Training Prescription  Yes  Yes  Yes  Yes    Weight  '1  2  3  3    ' Reps  10-15  10-15  10-15  10-15      Treadmill   MPH  1.1  1.1  - switched to AE due to leg and toe pain  -    Grade  0  0  -  -    Minutes  17  17  -  -    METs  1.84  1.8  -  -      NuStep   Level  '1  2  2  2    ' SPM  85  77  84  71    Minutes  '22  22  22  22    ' METs  1.9  1.8  2.1  1.8      Arm Ergometer   Level  -  -  1.5  2    Watts  -  -  12  13    Minutes  -  -  22  17    METs  -  -  2.1  2.2       Exercise Comments:  Exercise Comments    Row Name 03/13/18 1434 04/03/18 1445 04/25/18 0811       Exercise Comments  Patient is doing well in the program, he has attended 9 sessions so far. He is working to get to feeling better, stronger and lose weight.   Patient continues to attend regularly. He has done well with his exercise especially after being switched from the TM to he Arm Ergometer. He is increasing his MET level daily.   Since switching from the TM to the Arm Ergometer the patient feels his stamina has increased because he is able to tolerate the AE longer and at a higher pace than the treadmill. He is still trying to get to the root of his leg and back pain so we will  continue to monitor and progress/adjust as needed.         Exercise Goals and Review:  Exercise Goals    Row Name 02/17/18 1417             Exercise Goals   Increase Physical Activity  Yes       Intervention  Provide advice, education, support and counseling about physical activity/exercise needs.;Develop an individualized exercise prescription for aerobic and resistive training based on initial evaluation findings, risk stratification, comorbidities and participant's personal goals.       Expected Outcomes  Short Term: Attend rehab on a regular basis to increase amount of physical activity.;Long Term: Add in home exercise to make exercise part of routine and to increase amount of physical activity.;Long Term: Exercising regularly at least 3-5 days a week.       Increase Strength and Stamina  Yes       Intervention  Provide advice, education, support and counseling about physical activity/exercise needs.;Develop an individualized exercise prescription for aerobic and resistive training based on initial evaluation findings, risk stratification, comorbidities and participant's personal goals.       Expected Outcomes  Short Term: Increase workloads from initial exercise prescription for resistance, speed, and METs.;Short Term: Perform resistance training exercises routinely during rehab and add in resistance training at home;Long Term: Improve cardiorespiratory fitness, muscular endurance and strength as measured by increased METs and functional capacity (6MWT)       Able to understand and use rate of perceived exertion (RPE) scale  Yes       Intervention  Provide education and explanation on how to use RPE scale  Expected Outcomes  Short Term: Able to use RPE daily in rehab to express subjective intensity level;Long Term:  Able to use RPE to guide intensity level when exercising independently       Able to understand and use Dyspnea scale  Yes       Intervention  Provide education and  explanation on how to use Dyspnea scale       Expected Outcomes  Short Term: Able to use Dyspnea scale daily in rehab to express subjective sense of shortness of breath during exertion;Long Term: Able to use Dyspnea scale to guide intensity level when exercising independently       Knowledge and understanding of Target Heart Rate Range (THRR)  Yes       Intervention  Provide education and explanation of THRR including how the numbers were predicted and where they are located for reference       Expected Outcomes  Short Term: Able to state/look up THRR;Short Term: Able to use daily as guideline for intensity in rehab;Long Term: Able to use THRR to govern intensity when exercising independently       Able to check pulse independently  Yes       Intervention  Provide education and demonstration on how to check pulse in carotid and radial arteries.;Review the importance of being able to check your own pulse for safety during independent exercise       Expected Outcomes  Short Term: Able to explain why pulse checking is important during independent exercise;Long Term: Able to check pulse independently and accurately       Understanding of Exercise Prescription  Yes       Intervention  Provide education, explanation, and written materials on patient's individual exercise prescription       Expected Outcomes  Short Term: Able to explain program exercise prescription;Long Term: Able to explain home exercise prescription to exercise independently          Exercise Goals Re-Evaluation : Exercise Goals Re-Evaluation    Row Name 03/13/18 1432 04/03/18 1444 04/25/18 0758         Exercise Goal Re-Evaluation   Exercise Goals Review  Increase Physical Activity;Increase Strength and Stamina;Able to understand and use Dyspnea scale;Able to check pulse independently;Understanding of Exercise Prescription;Knowledge and understanding of Target Heart Rate Range (THRR);Able to understand and use rate of perceived  exertion (RPE) scale  Increase Physical Activity;Increase Strength and Stamina;Able to understand and use Dyspnea scale;Able to check pulse independently;Understanding of Exercise Prescription;Knowledge and understanding of Target Heart Rate Range (THRR);Able to understand and use rate of perceived exertion (RPE) scale  Increase Physical Activity;Increase Strength and Stamina;Able to understand and use Dyspnea scale;Able to check pulse independently;Understanding of Exercise Prescription;Knowledge and understanding of Target Heart Rate Range (THRR);Able to understand and use rate of perceived exertion (RPE) scale     Comments  Patient has done well in the program so far. He has tolerated the exercise well and is eager to get better. He has to watch his weight due to CHF and is strggling a bit with that. He is up to 1.40mh on the treadmill. We will continue to progress him as tolerated.   Pt. has done well in the program so far. He has been experiencing some leg and toe pain so he has been set back a little but continues to push himself to get stronger.   Pt. truly does the best he can when he attends his workout sessions. Due to some continuing leg and  toe pain as well as sickness he has been an little set back but he always pushes himself on the Arm Ergometer and NuStep when he can.     Expected Outcomes  Feel better and have the ability to do more without getting tired.   Feel better and have the ability to do more without getting SOB  Feel better and have the ability to do more without getting SOB         Discharge Exercise Prescription (Final Exercise Prescription Changes): Exercise Prescription Changes - 04/18/18 1200      Response to Exercise   Blood Pressure (Admit)  124/66    Blood Pressure (Exercise)  140/70    Blood Pressure (Exit)  120/66    Heart Rate (Admit)  88 bpm    Heart Rate (Exercise)  101 bpm    Heart Rate (Exit)  95 bpm    Rating of Perceived Exertion (Exercise)  16    Duration   Progress to 30 minutes of  aerobic without signs/symptoms of physical distress    Intensity  THRR unchanged      Progression   Progression  Continue to progress workloads to maintain intensity without signs/symptoms of physical distress.    Average METs  2      Resistance Training   Training Prescription  Yes    Weight  3    Reps  10-15      NuStep   Level  2    SPM  71    Minutes  22    METs  1.8      Arm Ergometer   Level  2    Watts  13    Minutes  17    METs  2.2       Nutrition:  Target Goals: Understanding of nutrition guidelines, daily intake of sodium <1572m, cholesterol <2049m calories 30% from fat and 7% or less from saturated fats, daily to have 5 or more servings of fruits and vegetables.  Biometrics: Pre Biometrics - 02/17/18 1418      Pre Biometrics   Height  '5\' 9"'  (1.753 m)    Weight  128.8 kg    Waist Circumference  58.5 inches    Hip Circumference  56.5 inches    Waist to Hip Ratio  1.04 %    BMI (Calculated)  41.91    Triceps Skinfold  12 mm    % Body Fat  41.6 %    Grip Strength  19.03 kg    Flexibility  0 in    Single Leg Stand  1.52 seconds        Nutrition Therapy Plan and Nutrition Goals: Nutrition Therapy & Goals - 03/03/18 1428      Personal Nutrition Goals   Nutrition Goal  For heart healthy choices add >50% of whole grains, make half their plate fruits and vegetables. Discuss the difference between starchy vegetables and leafy greens, and how leafy vegetables provide fiber, helps maintain healthy weight, helps control blood glucose, and lowers cholesterol.  Discuss purchasing fresh or frozen vegetable to reduce sodium and not to add grease, fat or sugar. Consume <18oz of red meat per week. Consume lean cuts of meats and very little of meats high in sodium and nitrates such as pork and lunch meats. Discussed portion control for all food groups.      Comments  Patient attended RD class 03/02/18.       Nutrition  Assessments: Nutrition Assessments - 02/17/18 1448  MEDFICTS Scores   Pre Score  41       Nutrition Goals Re-Evaluation: Nutrition Goals Re-Evaluation    Row Name 03/13/18 1513 04/03/18 1453 04/27/18 0739         Goals   Current Weight  274 lb (124.3 kg)  288 lb (130.6 kg)  285 lb (129.3 kg)     Nutrition Goal  For heart healthy choices add >50% of whole grains, make half their plate fruits and vegetables. Discuss the difference between starchy vegetables and leafy greens, and how leafy vegetables provide fiber, helps maintain healthy weight, helps control blood glucose, and lowers cholesterol.  Discuss purchasing fresh or frozen vegetable to reduce sodium and not to add grease, fat or sugar. Consume <18oz of red meat per week. Consume lean cuts of meats and very little of meats high in sodium and nitrates such as pork and lunch meats. Discussed portion control for all food groups.    For heart healthy choices add >50% of whole grains, make half their plate fruits and vegetables. Discuss the difference between starchy vegetables and leafy greens, and how leafy vegetables provide fiber, helps maintain healthy weight, helps control blood glucose, and lowers cholesterol.  Discuss purchasing fresh or frozen vegetable to reduce sodium and not to add grease, fat or sugar. Consume <18oz of red meat per week. Consume lean cuts of meats and very little of meats high in sodium and nitrates such as pork and lunch meats. Discussed portion control for all food groups.    For heart healthy choices add >50% of whole grains, make half their plate fruits and vegetables. Discuss the difference between starchy vegetables and leafy greens, and how leafy vegetables provide fiber, helps maintain healthy weight, helps control blood glucose, and lowers cholesterol.  Discuss purchasing fresh or frozen vegetable to reduce sodium and not to add grease, fat or sugar. Consume <18oz of red meat per week. Consume lean cuts of  meats and very little of meats high in sodium and nitrates such as pork and lunch meats. Discussed portion control for all food groups.       Comment  Patient has lost 10 lbs since his initial visit. He says he is trying to eat healthier. He has met with RD. Will continue to monitor.   Patient has gained 14 lbs since last 30 day review. His weight does fluctuate. He says he is eating more now due to the holidays but he is also concerned with the possibility of fluid retention. He has gained 6 lbs in a week. He says he sees his PCP tomorrow and he is going to report the weight gain. Will continue to monitor for progress.   Patient has lost 3 lbs since last 30 day review. His weight continues to fluctuate. He says he is trying to limit his Na intake and sugar intake. Will continue to monitor for progress.      Expected Outcome  Patient will continue to work toward his nutritional goals.   Patient will continue to work toward his nutritional goals.   Patient will continue to work toward his nutritional goals.         Nutrition Goals Discharge (Final Nutrition Goals Re-Evaluation): Nutrition Goals Re-Evaluation - 04/27/18 0739      Goals   Current Weight  285 lb (129.3 kg)    Nutrition Goal  For heart healthy choices add >50% of whole grains, make half their plate fruits and vegetables. Discuss the difference between starchy vegetables and  leafy greens, and how leafy vegetables provide fiber, helps maintain healthy weight, helps control blood glucose, and lowers cholesterol.  Discuss purchasing fresh or frozen vegetable to reduce sodium and not to add grease, fat or sugar. Consume <18oz of red meat per week. Consume lean cuts of meats and very little of meats high in sodium and nitrates such as pork and lunch meats. Discussed portion control for all food groups.      Comment  Patient has lost 3 lbs since last 30 day review. His weight continues to fluctuate. He says he is trying to limit his Na intake and  sugar intake. Will continue to monitor for progress.     Expected Outcome  Patient will continue to work toward his nutritional goals.        Psychosocial: Target Goals: Acknowledge presence or absence of significant depression and/or stress, maximize coping skills, provide positive support system. Participant is able to verbalize types and ability to use techniques and skills needed for reducing stress and depression.  Initial Review & Psychosocial Screening: Initial Psych Review & Screening - 02/17/18 1445      Initial Review   Current issues with  None Identified      Family Dynamics   Good Support System?  Yes    Concerns  --   Would like to have a bewtter relationship with his sons.      Barriers   Psychosocial barriers to participate in program  There are no identifiable barriers or psychosocial needs.      Screening Interventions   Interventions  Encouraged to exercise    Expected Outcomes  Short Term goal: Identification and review with participant of any Quality of Life or Depression concerns found by scoring the questionnaire.;Long Term goal: The participant improves quality of Life and PHQ9 Scores as seen by post scores and/or verbalization of changes       Quality of Life Scores: Quality of Life - 02/17/18 1422      Quality of Life   Select  Quality of Life      Quality of Life Scores   Health/Function Pre  17.13 %    Socioeconomic Pre  19.07 %    Psych/Spiritual Pre  19.36 %    Family Pre  20.4 %    GLOBAL Pre  18.47 %      Scores of 19 and below usually indicate a poorer quality of life in these areas.  A difference of  2-3 points is a clinically meaningful difference.  A difference of 2-3 points in the total score of the Quality of Life Index has been associated with significant improvement in overall quality of life, self-image, physical symptoms, and general health in studies assessing change in quality of life.  PHQ-9: Recent Review Flowsheet Data     Depression screen [facility] 2/9 02/17/2018   Decreased Interest 0   Down, Depressed, Hopeless 0   PHQ - 2 Score 0   Altered sleeping 1   Tired, decreased energy 2   Change in appetite 1   Feeling bad or failure about yourself  1   Trouble concentrating 1   Moving slowly or fidgety/restless 0   Suicidal thoughts 0   PHQ-9 Score 6   Difficult doing work/chores Somewhat difficult     Interpretation of Total Score  Total Score Depression Severity:  1-4 = Minimal depression, 5-9 = Mild depression, 10-14 = Moderate depression, 15-19 = Moderately severe depression, 20-27 = Severe depression   Psychosocial Evaluation  and Intervention: Psychosocial Evaluation - 02/17/18 1446      Psychosocial Evaluation & Interventions   Interventions  Encouraged to exercise with the program and follow exercise prescription    Continue Psychosocial Services   No Follow up required       Psychosocial Re-Evaluation: Psychosocial Re-Evaluation    [place] Name 03/13/18 1516 04/03/18 1458 04/27/18 8299         Psychosocial Re-Evaluation   Current issues with  None Identified  None Identified  None Identified     Comments  Patient initial QOL score was 18.47/and his PHQ_9 score was 6 with no psychosocial barriers identified.   Patient initial QOL score was 18.47/and his PHQ_9 score was 6 with no psychosocial barriers identified.   Patient initial QOL score was 18.47/and his PHQ_9 score was 6 with no psychosocial barriers identified.      Expected Outcomes  Patient will have no psychosocial issues identified at discharge with improved QOL and PHQ-9 scores.  Patient will have no psychosocial issues identified at discharge with improved QOL and PHQ-9 scores.  Patient will have no psychosocial issues identified at discharge with improved QOL and PHQ-9 scores.     Interventions  Stress management education;Encouraged to attend Cardiac Rehabilitation for the exercise;Relaxation education  Stress management education;Encouraged  to attend Cardiac Rehabilitation for the exercise;Relaxation education  Stress management education;Encouraged to attend Cardiac Rehabilitation for the exercise;Relaxation education     Continue Psychosocial Services   No Follow up required  No Follow up required  No Follow up required        Psychosocial Discharge (Final Psychosocial Re-Evaluation): Psychosocial Re-Evaluation - 04/27/18 0742      Psychosocial Re-Evaluation   Current issues with  None Identified    Comments  Patient initial QOL score was 18.47/and his PHQ_9 score was 6 with no psychosocial barriers identified.     Expected Outcomes  Patient will have no psychosocial issues identified at discharge with improved QOL and PHQ-9 scores.    Interventions  Stress management education;Encouraged to attend Cardiac Rehabilitation for the exercise;Relaxation education    Continue Psychosocial Services   No Follow up required       Vocational Rehabilitation: Provide vocational rehab assistance to qualifying candidates.   Vocational Rehab Evaluation & Intervention: Vocational Rehab - 02/17/18 1449      Initial Vocational Rehab Evaluation & Intervention   Assessment shows need for Vocational Rehabilitation  No       Education: Education Goals: Education classes will be provided on a weekly basis, covering required topics. Participant will state understanding/return demonstration of topics presented.  Learning Barriers/Preferences: Learning Barriers/Preferences - 02/17/18 1448      Learning Barriers/Preferences   Learning Barriers  None    Learning Preferences  Group Instruction;Individual Instruction;Skilled Demonstration       Education Topics: Hypertension, Hypertension Reduction -Define heart disease and high blood pressure. Discus how high blood pressure affects the body and ways to reduce high blood pressure.   CARDIAC REHAB PHASE II EXERCISE from 04/26/2018 in [place]  Date  02/22/18   Educator  D. Coad  Instruction Review Code  2- Demonstrated Understanding      Exercise and Your Heart -Discuss why it is important to exercise, the FITT principles of exercise, normal and abnormal responses to exercise, and how to exercise safely.   CARDIAC REHAB PHASE II EXERCISE from 04/26/2018 in [place]  Date  03/01/18  Educator  Coad  Instruction Review Code  2- Demonstrated Understanding      Angina -Discuss definition of angina, causes of angina, treatment of angina, and how to decrease risk of having angina.   CARDIAC REHAB PHASE II EXERCISE from 04/26/2018 in [place]  Date  03/08/18  Educator  Etheleen Mayhew   Instruction Review Code  2- Demonstrated Understanding      Cardiac Medications -Review what the following cardiac medications are used for, how they affect the body, and side effects that may occur when taking the medications.  Medications include Aspirin, Beta blockers, calcium channel blockers, ACE Inhibitors, angiotensin receptor blockers, diuretics, digoxin, and antihyperlipidemics.   CARDIAC REHAB PHASE II EXERCISE from 04/26/2018 in [place]  Date  03/15/18  Educator  Wynetta Emery  Instruction Review Code  2- Demonstrated Understanding      Congestive Heart Failure -Discuss the definition of CHF, how to live with CHF, the signs and symptoms of CHF, and how keep track of weight and sodium intake.   CARDIAC REHAB PHASE II EXERCISE from 04/26/2018 in [place]  Date  03/22/18  Educator  Wynetta Emery  Instruction Review Code  2- Demonstrated Understanding      Heart Disease and Intimacy -Discus the effect sexual activity has on the heart, how changes occur during intimacy as we age, and safety during sexual activity.   CARDIAC REHAB PHASE II EXERCISE from 04/26/2018 in [place]  Date  03/29/18  Educator  Wynetta Emery  Instruction Review Code  2-  Demonstrated Understanding      Smoking Cessation / COPD -Discuss different methods to quit smoking, the health benefits of quitting smoking, and the definition of COPD.   CARDIAC REHAB PHASE II EXERCISE from 04/26/2018 in [place]  Date  04/05/18  Educator  Wynetta Emery  Instruction Review Code  2- Demonstrated Understanding      Nutrition I: Fats -Discuss the types of cholesterol, what cholesterol does to the heart, and how cholesterol levels can be controlled.   Nutrition II: Labels -Discuss the different components of food labels and how to read food label   CARDIAC REHAB PHASE II EXERCISE from 04/26/2018 in [place]  Date  04/21/18  Educator  Wynetta Emery  Instruction Review Code  2- Demonstrated Understanding      Heart Parts/Heart Disease and PAD -Discuss the anatomy of the heart, the pathway of blood circulation through the heart, and these are affected by heart disease.   CARDIAC REHAB PHASE II EXERCISE from 04/26/2018 in [place]  Date  04/26/18  Educator  DJ  Instruction Review Code  2- Demonstrated Understanding      Stress I: Signs and Symptoms -Discuss the causes of stress, how stress may lead to anxiety and depression, and ways to limit stress.   Stress II: Relaxation -Discuss different types of relaxation techniques to limit stress.   Warning Signs of Stroke / TIA -Discuss definition of a stroke, what the signs and symptoms are of a stroke, and how to identify when someone is having stroke.   Knowledge Questionnaire Score: Knowledge Questionnaire Score - 02/17/18 1448      Knowledge Questionnaire Score   Pre Score  21/24       Core Components/Risk Factors/Patient Goals at Admission: Personal Goals and Risk Factors at Admission - 02/17/18 1449      Core Components/Risk Factors/Patient Goals on Admission    Weight Management  Weight Loss    Personal Goal Other  Yes    Personal Goal   To feel better, be able to do more w/o getting tired.     Intervention  Attend CR 3 x week and supplement 2 x week with at home exercise    Expected Outcomes  Reach personal goals.        Core Components/Risk Factors/Patient Goals Review:  Goals and Risk Factor Review    Row Name 03/13/18 1514 04/03/18 1456 04/27/18 0741         Core Components/Risk Factors/Patient Goals Review   Personal Goals Review  Weight Management/Obesity Feel better; do more without getting tired.   Weight Management/Obesity Feel better; do more w/o getting fatigue.   Weight Management/Obesity;Diabetes Feel better; do more w/o getting tired.      Review  Patient has completed 9 sessions losing 10 lbs since his initial visit. He is doing well in the program with some progression. He says he feels like his stamina has improved and feels better overall. He is pleased with his progress so far and hopes to gain more as he continues.   Patient has completed 17 sessions gaining 14 lbs since last 30 day review. He is doing well in the program with some progression. He was taken off of the treadmill due to pain in his feet. He is not doing the nustep and eromogeter. He says he is feeling stronger and feels he is improving overall. Will continue to montior for progress.   Patient has completed 22 sessions losing 3 lbs since last 30 day reivew. He continues to do well in the program with progression. He says he feels better overall and is sleeping better since starting the program. He says he is much more activity at home helping out with chores and just more active in general w/o getting tired. His last A1C wqas 10/19 at 8.5 mg/dl. His reported fasting glucose reading are usually greater than 150 mg/dl.  He feels he has gained a lot more upper body strength and is pleased with his progress. Will continue to monitor.      Expected Outcomes  Patient will continue to attend sessions and complete the program meeting his personal goals.    Patient will continue to attend sessions and complete the program meeting his personal goals.   Patient will continue to attend sessions and complete the program meeting his personal goals.         Core Components/Risk Factors/Patient Goals at Discharge (Final Review):  Goals and Risk Factor Review - 04/27/18 0741      Core Components/Risk Factors/Patient Goals Review   Personal Goals Review  Weight Management/Obesity;Diabetes   Feel better; do more w/o getting tired.    Review  Patient has completed 22 sessions losing 3 lbs since last 30 day reivew. He continues to do well in the program with progression. He says he feels better overall and is sleeping better since starting the program. He says he is much more activity at home helping out with chores and just more active in general w/o getting tired. His last A1C wqas 10/19 at 8.5 mg/dl. His reported fasting glucose reading are usually greater than 150 mg/dl.  He feels he has gained a lot more upper body strength and is pleased with his progress. Will continue to monitor.     Expected Outcomes  Patient will continue to attend sessions and complete the program meeting his personal goals.        ITP Comments: ITP Comments    Row Name  02/17/18 1319 02/20/18 1506         ITP Comments  Patient is coming to CR per Dr. Selinda Michaels request due to CHF. Had to delay starting due to needing pacemaker placement. He is eager to get started. He also inform us of his balance issues.   Patient new to the program. He has completed 2 sessions. Will continue to monitor for progress.          Comments: ITP REVIEW Patient doing well in the program. Will continue to monitor for progress.

## 2018-04-28 ENCOUNTER — Encounter (HOSPITAL_COMMUNITY): Payer: Medicare HMO

## 2018-05-01 ENCOUNTER — Encounter (HOSPITAL_COMMUNITY): Payer: Medicare HMO

## 2018-05-01 DIAGNOSIS — E611 Iron deficiency: Secondary | ICD-10-CM | POA: Diagnosis not present

## 2018-05-01 DIAGNOSIS — D649 Anemia, unspecified: Secondary | ICD-10-CM | POA: Diagnosis not present

## 2018-05-01 DIAGNOSIS — R790 Abnormal level of blood mineral: Secondary | ICD-10-CM | POA: Diagnosis not present

## 2018-05-01 DIAGNOSIS — Z6841 Body Mass Index (BMI) 40.0 and over, adult: Secondary | ICD-10-CM | POA: Diagnosis not present

## 2018-05-01 DIAGNOSIS — L03116 Cellulitis of left lower limb: Secondary | ICD-10-CM | POA: Diagnosis not present

## 2018-05-01 DIAGNOSIS — I259 Chronic ischemic heart disease, unspecified: Secondary | ICD-10-CM | POA: Diagnosis not present

## 2018-05-01 DIAGNOSIS — R5383 Other fatigue: Secondary | ICD-10-CM | POA: Diagnosis not present

## 2018-05-01 DIAGNOSIS — I5022 Chronic systolic (congestive) heart failure: Secondary | ICD-10-CM | POA: Diagnosis not present

## 2018-05-01 DIAGNOSIS — I10 Essential (primary) hypertension: Secondary | ICD-10-CM | POA: Diagnosis not present

## 2018-05-01 DIAGNOSIS — D519 Vitamin B12 deficiency anemia, unspecified: Secondary | ICD-10-CM | POA: Diagnosis not present

## 2018-05-03 ENCOUNTER — Encounter (HOSPITAL_COMMUNITY): Payer: Medicare HMO

## 2018-05-04 ENCOUNTER — Ambulatory Visit (INDEPENDENT_AMBULATORY_CARE_PROVIDER_SITE_OTHER): Payer: Medicare HMO | Admitting: Pharmacist

## 2018-05-04 DIAGNOSIS — Z5181 Encounter for therapeutic drug level monitoring: Secondary | ICD-10-CM | POA: Diagnosis not present

## 2018-05-04 DIAGNOSIS — I255 Ischemic cardiomyopathy: Secondary | ICD-10-CM

## 2018-05-04 DIAGNOSIS — I4891 Unspecified atrial fibrillation: Secondary | ICD-10-CM | POA: Diagnosis not present

## 2018-05-04 LAB — POCT INR: INR: 2.5 (ref 2.0–3.0)

## 2018-05-04 NOTE — Patient Instructions (Signed)
Description   Continue 1/2 tablet daily except 1 tablet on Tuesdays Recheck on Tuesday due to antibiotic.

## 2018-05-05 ENCOUNTER — Encounter (HOSPITAL_COMMUNITY): Payer: Medicare HMO

## 2018-05-06 DIAGNOSIS — D509 Iron deficiency anemia, unspecified: Secondary | ICD-10-CM | POA: Diagnosis not present

## 2018-05-08 ENCOUNTER — Encounter (HOSPITAL_COMMUNITY): Payer: Medicare HMO

## 2018-05-10 ENCOUNTER — Encounter (HOSPITAL_COMMUNITY): Payer: Medicare HMO

## 2018-05-10 DIAGNOSIS — R69 Illness, unspecified: Secondary | ICD-10-CM | POA: Diagnosis not present

## 2018-05-12 ENCOUNTER — Encounter (HOSPITAL_COMMUNITY): Payer: Medicare HMO

## 2018-05-15 DIAGNOSIS — W19XXXA Unspecified fall, initial encounter: Secondary | ICD-10-CM | POA: Diagnosis not present

## 2018-05-15 DIAGNOSIS — R531 Weakness: Secondary | ICD-10-CM | POA: Diagnosis not present

## 2018-05-15 DIAGNOSIS — E1165 Type 2 diabetes mellitus with hyperglycemia: Secondary | ICD-10-CM | POA: Diagnosis not present

## 2018-05-15 NOTE — Addendum Note (Signed)
Encounter addended by: Suann Larry, RN on: 05/15/2018 3:01 PM  Actions taken: Episode resolved

## 2018-05-15 NOTE — Progress Notes (Signed)
Discharge Progress Report  Patient Details  Name: Noah [name] MRN: [id number] Date of Birth: 30-Aug-1940 Referring Provider:     CARDIAC REHAB PHASE II ORIENTATION from 02/17/2018 in [facility]ARD[facility]AC [facility]IL[facility]ATION  Referring Provider  Koneswaran       Number of Visits: 22  Reason for Discharge:  Early Exit:  Health issues and lack of attendance.   Smoking History:  Social History   Tobacco Use  Smoking Status Former Smoker  . Packs/day: 1.00  . Years: 30.00  . Pack years: 30.00  . Last attempt to quit: 09/01/2000  . Years since quitting: 17.7  Smokeless Tobacco Never Used    Diagnosis:  CHF (congestive heart failure), NYHA class I, chronic, systolic (HCC)  ADL UCSD:   Initial Exercise Prescription: Initial Exercise Prescription - 02/17/18 1400      Date of Initial Exercise RX and Referring Provider   Date  02/17/18    Referring Provider  Koneswaran    Expected Discharge Date  05/20/17      Treadmill   MPH  0.8    Grade  0    Minutes  17    METs  1.61      NuStep   Level  1    SPM  65    Minutes  17    METs  1.8      Prescription Details   Frequency (times per week)  3    Duration  Progress to 30 minutes of continuous aerobic without signs/symptoms of physical distress      Intensity   THRR 40-80% of Max Heartrate  101-115-129    Ratings of Perceived Exertion  11-13    Perceived Dyspnea  0-4      Progression   Progression  Continue to progress workloads to maintain intensity without signs/symptoms of physical distress.      Resistance Training   Training Prescription  Yes    Weight  1    Reps  10-15       Discharge Exercise Prescription (Final Exercise Prescription Changes): Exercise Prescription Changes - 05/02/18 1300      Response to Exercise   Blood Pressure (Admit)  134/76    Blood Pressure (Exercise)  146/70    Blood Pressure (Exit)  124/78    Heart Rate (Admit)  79 bpm    Heart Rate (Exercise)  99 bpm    Heart Rate (Exit)  88  bpm    Rating of Perceived Exertion (Exercise)  15    Duration  Continue with 30 min of aerobic exercise without signs/symptoms of physical distress.    Intensity  THRR unchanged      Progression   Progression  Continue to progress workloads to maintain intensity without signs/symptoms of physical distress.    Average METs  2      Resistance Training   Training Prescription  Yes    Weight  3    Reps  10-15      NuStep   Level  2    SPM  78    Minutes  22    METs  2.1      Arm Ergometer   Level  2    Watts  19    Minutes  17    METs  1.9       Functional Capacity: 6 Minute Walk    Row Name 02/17/18 1413         6 Minute Walk   Phase  Initial  Distance  650 feet     Walk Time  6 minutes     # of Rest Breaks  0     MPH  1.23     METS  1.94     RPE  15     Perceived Dyspnea   13     VO2 Peak  2.11     Symptoms  Yes (comment)     Comments  3/10 back pain while walking     Resting HR  76 bpm     Resting BP  140/76     Resting Oxygen Saturation   95 %     Exercise Oxygen Saturation  during 6 min walk  90 %     Max Ex. HR  92 bpm     Max Ex. BP  162/78     2 Minute Post BP  136/76        Psychological, QOL, Others - Outcomes: PHQ 2/9: Depression screen PHQ 2/9 02/17/2018  Decreased Interest 0  Down, Depressed, Hopeless 0  PHQ - 2 Score 0  Altered sleeping 1  Tired, decreased energy 2  Change in appetite 1  Feeling bad or failure about yourself  1  Trouble concentrating 1  Moving slowly or fidgety/restless 0  Suicidal thoughts 0  PHQ-9 Score 6  Difficult doing work/chores Somewhat difficult    Quality of Life: Quality of Life - 02/17/18 1422      Quality of Life   Select  Quality of Life      Quality of Life Scores   Health/Function Pre  17.13 %    Socioeconomic Pre  19.07 %    Psych/Spiritual Pre  19.36 %    Family Pre  20.4 %    GLOBAL Pre  18.47 %       Personal Goals: Goals established at orientation with interventions provided to  work toward goal. Personal Goals and Risk Factors at Admission - 02/17/18 1449      Core Components/Risk Factors/Patient Goals on Admission    Weight Management  Weight Loss    Personal Goal Other  Yes    Personal Goal  To feel better, be able to do more w/o getting tired.     Intervention  Attend CR 3 x week and supplement 2 x week with at home exercise    Expected Outcomes  Reach personal goals.         Personal Goals Discharge: Goals and Risk Factor Review    Row Name 03/13/18 1514 04/03/18 1456 04/27/18 0741         Core Components/Risk Factors/Patient Goals Review   Personal Goals Review  Weight Management/Obesity Feel better; do more without getting tired.   Weight Management/Obesity Feel better; do more w/o getting fatigue.   Weight Management/Obesity;Diabetes Feel better; do more w/o getting tired.      Review  Patient has completed 9 sessions losing 10 lbs since his initial visit. He is doing well in the program with some progression. He says he feels like his stamina has improved and feels better overall. He is pleased with his progress so far and hopes to gain more as he continues.   Patient has completed 17 sessions gaining 14 lbs since last 30 day review. He is doing well in the program with some progression. He was taken off of the treadmill due to pain in his feet. He is not doing the nustep and eromogeter. He says he is feeling stronger and feels  he is improving overall. Will continue to montior for progress.   Patient has completed 22 sessions losing 3 lbs since last 30 day reivew. He continues to do well in the program with progression. He says he feels better overall and is sleeping better since starting the program. He says he is much more activity at home helping out with chores and just more active in general w/o getting tired. His last A1C wqas 10/19 at 8.5 mg/dl. His reported fasting glucose reading are usually greater than 150 mg/dl.  He feels he has gained a lot more  upper body strength and is pleased with his progress. Will continue to monitor.      Expected Outcomes  Patient will continue to attend sessions and complete the program meeting his personal goals.   Patient will continue to attend sessions and complete the program meeting his personal goals.   Patient will continue to attend sessions and complete the program meeting his personal goals.         Exercise Goals and Review: Exercise Goals    Row Name 02/17/18 1417             Exercise Goals   Increase Physical Activity  Yes       Intervention  Provide advice, education, support and counseling about physical activity/exercise needs.;Develop an individualized exercise prescription for aerobic and resistive training based on initial evaluation findings, risk stratification, comorbidities and participant's personal goals.       Expected Outcomes  Short Term: Attend rehab on a regular basis to increase amount of physical activity.;Long Term: Add in home exercise to make exercise part of routine and to increase amount of physical activity.;Long Term: Exercising regularly at least 3-5 days a week.       Increase Strength and Stamina  Yes       Intervention  Provide advice, education, support and counseling about physical activity/exercise needs.;Develop an individualized exercise prescription for aerobic and resistive training based on initial evaluation findings, risk stratification, comorbidities and participant's personal goals.       Expected Outcomes  Short Term: Increase workloads from initial exercise prescription for resistance, speed, and METs.;Short Term: Perform resistance training exercises routinely during rehab and add in resistance training at home;Long Term: Improve cardiorespiratory fitness, muscular endurance and strength as measured by increased METs and functional capacity ( )       Able to understand and use rate of perceived exertion (RPE) scale  Yes       Intervention  Provide  education and explanation on how to use RPE scale       Expected Outcomes  Short Term: Able to use RPE daily in rehab to express subjective intensity level;Long Term:  Able to use RPE to guide intensity level when exercising independently       Able to understand and use Dyspnea scale  Yes       Intervention  Provide education and explanation on how to use Dyspnea scale       Expected Outcomes  Short Term: Able to use Dyspnea scale daily in rehab to express subjective sense of shortness of breath during exertion;Long Term: Able to use Dyspnea scale to guide intensity level when exercising independently       Knowledge and understanding of Target Heart Rate Range (THRR)  Yes       Intervention  Provide education and explanation of THRR including how the numbers were predicted and where they are located for reference  Expected Outcomes  Short Term: Able to state/look up THRR;Short Term: Able to use daily as guideline for intensity in rehab;Long Term: Able to use THRR to govern intensity when exercising independently       Able to check pulse independently  Yes       Intervention  Provide education and demonstration on how to check pulse in carotid and radial arteries.;Review the importance of being able to check your own pulse for safety during independent exercise       Expected Outcomes  Short Term: Able to explain why pulse checking is important during independent exercise;Long Term: Able to check pulse independently and accurately       Understanding of Exercise Prescription  Yes       Intervention  Provide education, explanation, and written materials on patient's individual exercise prescription       Expected Outcomes  Short Term: Able to explain program exercise prescription;Long Term: Able to explain home exercise prescription to exercise independently          Exercise Goals Re-Evaluation: Exercise Goals Re-Evaluation    Row Name 03/13/18 1432 04/03/18 1444 04/25/18 0758          Exercise Goal Re-Evaluation   Exercise Goals Review  Increase Physical Activity;Increase Strength and Stamina;Able to understand and use Dyspnea scale;Able to check pulse independently;Understanding of Exercise Prescription;Knowledge and understanding of Target Heart Rate Range (THRR);Able to understand and use rate of perceived exertion (RPE) scale  Increase Physical Activity;Increase Strength and Stamina;Able to understand and use Dyspnea scale;Able to check pulse independently;Understanding of Exercise Prescription;Knowledge and understanding of Target Heart Rate Range (THRR);Able to understand and use rate of perceived exertion (RPE) scale  Increase Physical Activity;Increase Strength and Stamina;Able to understand and use Dyspnea scale;Able to check pulse independently;Understanding of Exercise Prescription;Knowledge and understanding of Target Heart Rate Range (THRR);Able to understand and use rate of perceived exertion (RPE) scale     Comments  Patient has done well in the program so far. He has tolerated the exercise well and is eager to get better. He has to watch his weight due to CHF and is strggling a bit with that. He is up to 1. on the treadmill. We will continue to progress him as tolerated.   Pt. has done well in the program so far. He has been experiencing some leg and toe pain so he has been set back a little but continues to push himself to get stronger.   Pt. truly does the best he can when he attends his workout sessions. Due to some continuing leg and toe pain as well as sickness he has been an little set back but he always pushes himself on the Arm Ergometer and NuStep when he can.     Expected Outcomes  Feel better and have the ability to do more without getting tired.   Feel better and have the ability to do more without getting SOB  Feel better and have the ability to do more without getting SOB        Nutrition & Weight - Outcomes: Pre Biometrics - 02/17/18 1418      Pre  Biometrics   Height  5\' 9"  (1.753 m)    Weight  128.8 kg    Waist Circumference  58.5 inches    Hip Circumference  56.5 inches    Waist to Hip Ratio  1.04 %    BMI (Calculated)  41.91    Triceps Skinfold  12 mm    %  Body Fat  41.6 %    Grip Strength  19.03 kg    Flexibility  0 in    Single Leg Stand  1.52 seconds        Nutrition: Nutrition Therapy & Goals - 03/03/18 1428      Personal Nutrition Goals   Nutrition Goal  For heart healthy choices add >50% of whole grains, make half their plate fruits and vegetables. Discuss the difference between starchy vegetables and leafy greens, and how leafy vegetables provide fiber, helps maintain healthy weight, helps control blood glucose, and lowers cholesterol.  Discuss purchasing fresh or frozen vegetable to reduce sodium and not to add grease, fat or sugar. Consume <18oz of red meat per week. Consume lean cuts of meats and very little of meats high in sodium and nitrates such as pork and lunch meats. Discussed portion control for all food groups.      Comments  Patient attended RD class 03/02/18.       Nutrition Discharge: Nutrition Assessments - 02/17/18 1448      MEDFICTS Scores   Pre Score  41       Education Questionnaire Score: Knowledge Questionnaire Score - 02/17/18 1448      Knowledge Questionnaire Score   Pre Score  21/24

## 2018-05-15 NOTE — Progress Notes (Signed)
Cardiac Individual Treatment Plan  Patient Details  Name: Noah [name] MRN: [id number] Date of Birth: 1940-08-08 Referring Provider:     [place] from 02/17/2018 in [place]  Referring Provider  Bronson Ing      Initial Encounter Date:    CARDIAC REHAB PHASE II ORIENTATION from 02/17/2018 in [facility]  Date  02/17/18      Visit Diagnosis: CHF (congestive heart failure), NYHA class I, chronic, systolic (HCC)  Patient's Home Medications on Admission:  Current Outpatient Medications:  .  acetaminophen (TYLENOL) 650 MG CR tablet, Take 1,300 mg by mouth at bedtime., Disp: , Rfl:  .  aspirin EC 81 MG tablet, Take 1 tablet (81 mg total) by mouth daily., Disp: 90 tablet, Rfl: 3 .  Coenzyme Q10 (COQ10) 100 MG CAPS, Take 100 mg by mouth every evening. , Disp: , Rfl:  .  cyclobenzaprine (FLEXERIL) 10 MG tablet, Take 10 mg by mouth 3 (three) times daily as needed for spasms., Disp: , Rfl: 3 .  finasteride (PROSCAR) 5 MG tablet, Take 5 mg by mouth every evening. , Disp: , Rfl: 4 .  FLUoxetine (PROZAC) 20 MG capsule, Take 20 mg by mouth every evening., Disp: , Rfl:  .  furosemide (LASIX) 40 MG tablet, Take 2 tabs (55m) by mouth every morning & 1 tab (442m every evening, Disp: 90 tablet, Rfl: 6 .  gabapentin (NEURONTIN) 100 MG capsule, Take 100 mg by mouth 2 (two) times daily. , Disp: , Rfl:  .  glipiZIDE (GLUCOTROL XL) 10 MG 24 hr tablet, Take 10 mg by mouth 2 (two) times daily., Disp: , Rfl:  .  glucose blood (ONE TOUCH ULTRA TEST) test strip, Check your sugars twice a day, Disp: 100 each, Rfl: 12 .  Insulin Glargine-Lixisenatide (SOLIQUA) 100-33 UNT-MCG/ML SOPN, Inject 40 Units into the skin every evening., Disp: , Rfl:  .  Insulin Pen Needle (NOVOFINE) 30G X 8 MM MISC, Inject 10 each into the skin as needed., Disp: 90 each, Rfl: 3 .  isosorbide mononitrate (IMDUR) 30 MG 24 hr tablet, Take 30 mg by mouth every evening. ,  Disp: , Rfl: 3 .  metFORMIN (GLUCOPHAGE-XR) 500 MG 24 hr tablet, Take 1,500 mg by mouth daily., Disp: , Rfl:  .  metoprolol (TOPROL-XL) 200 MG 24 hr tablet, Take 100 mg by mouth 2 (two) times daily., Disp: , Rfl:  .  Multiple Vitamin (MULTIVITAMIN) capsule, Take 1 capsule by mouth daily. , Disp: , Rfl:  .  Omega-3 Fatty Acids (FISH OIL) 1000 MG CAPS, Take 1,000 mg by mouth 2 (two) times daily. , Disp: , Rfl:  .  omeprazole (PRILOSEC) 20 MG capsule, Take 20 mg by mouth daily., Disp: , Rfl:  .  ranitidine (ZANTAC) 150 MG tablet, Take 150 mg by mouth at bedtime., Disp: , Rfl:  .  sacubitril-valsartan (ENTRESTO) 49-51 MG, Take 1 tablet by mouth 2 (two) times daily., Disp: 60 tablet, Rfl: 6 .  simvastatin (ZOCOR) 80 MG tablet, TAKE 80 MG (1 TABLET) DAILY. (Patient taking differently: Take 80 mg by mouth every evening. TAKE 80 MG (1 TABLET) DAILY.), Disp: 90 tablet, Rfl: 3 .  Tamsulosin HCl (FLOMAX) 0.4 MG CAPS, Take 0.8 mg by mouth every evening., Disp: , Rfl:  .  warfarin (COUMADIN) 5 MG tablet, Take 1/2 tablet daily except 1 tablet on Tuesdays, Thursdays and Saturdays, Disp: 45 tablet, Rfl: 3 .  zolpidem (AMBIEN) 10 MG tablet, Take 10 mg by  mouth at bedtime. , Disp: , Rfl:   Past Medical History: Past Medical History:  Diagnosis Date  . CAD S/P percutaneous coronary angioplasty    remote PCIs in 1990's- last CFX PCI 2008  . Chronic kidney disease, stage III (moderate) (HCC)   . Diabetes mellitus with complication (HCC)    with hyperglycemia and diabetic autonomic poly neuropathy  . Gastro-esophageal reflux disease with esophagitis   . Hyperlipidemia   . Hypertension   . Ischemic cardiomyopathy 01/20/2018   St Jude ICD   . Morbid obesity ([place])   . Obstructive sleep apnea    on CPAP  . Polyneuropathy   . Primary insomnia     Tobacco Use: Social History   Tobacco Use  Smoking Status Former Smoker  . Packs/day: 1.00  . Years: 30.00  . Pack years: 30.00  . Last attempt to quit:  09/01/2000  . Years since quitting: 17.7  Smokeless Tobacco Never Used    Labs: Recent Review Scientist, physiological    Labs for ITP Cardiac and Pulmonary Rehab 09/09/2007   Cholestrol 121 ATP III CLASSIFICATION: <200     mg/dL   Desirable 200-239  mg/dL   Borderline High >=240    mg/dL   High   LDLCALC 75 Total Cholesterol/HDL:CHD Risk Coronary Heart Disease Risk Table Men   Women 1/2 Average Risk   3.4   3.3   HDL 28(L)   Trlycerides 92      Capillary Blood Glucose: Lab Results  Component Value Date   GLUCAP 224 (H) 01/21/2018   GLUCAP 214 (H) 01/21/2018   GLUCAP 252 (H) 01/21/2018   GLUCAP 186 (H) 01/20/2018   GLUCAP 92 01/20/2018     Exercise Target Goals: Exercise Program Goal: Individual exercise prescription set using results from initial 6 min walk test and THRR while considering  patient's activity barriers and safety.   Exercise Prescription Goal: Starting with aerobic activity 30 plus minutes a day, 3 days per week for initial exercise prescription. Provide home exercise prescription and guidelines that participant acknowledges understanding prior to discharge.  Activity Barriers & Risk Stratification: Activity Barriers & Cardiac Risk Stratification - 02/17/18 1415      Activity Barriers & Cardiac Risk Stratification   Activity Barriers  Deconditioning;Muscular Weakness;Shortness of Breath;History of Falls;Balance Concerns    Cardiac Risk Stratification  High       6 Minute Walk: 6 Minute Walk    Row Name 02/17/18 1413         6 Minute Walk   Phase  Initial     Distance  650 feet     Walk Time  6 minutes     # of Rest Breaks  0     MPH  1.23     METS  1.94     RPE  15     Perceived Dyspnea   13     VO2 Peak  2.11     Symptoms  Yes (comment)     Comments  3/10 back pain while walking     Resting HR  76 bpm     Resting BP  140/76     Resting Oxygen Saturation   95 %     Exercise Oxygen Saturation  during 6 min walk  90 %     Max Ex. HR  92 bpm      Max Ex. BP  162/78     2 Minute Post BP  136/76  Oxygen Initial Assessment:   Oxygen Re-Evaluation:   Oxygen Discharge (Final Oxygen Re-Evaluation):   Initial Exercise Prescription: Initial Exercise Prescription - 02/17/18 1400      Date of Initial Exercise RX and Referring Provider   Date  02/17/18    Referring Provider  Koneswaran    Expected Discharge Date  05/20/17      Treadmill   MPH  0.8    Grade  0    Minutes  17    METs  1.61      NuStep   Level  1    SPM  65    Minutes  17    METs  1.8      Prescription Details   Frequency (times per week)  3    Duration  Progress to 30 minutes of continuous aerobic without signs/symptoms of physical distress      Intensity   THRR 40-80% of Max Heartrate  101-115-129    Ratings of Perceived Exertion  11-13    Perceived Dyspnea  0-4      Progression   Progression  Continue to progress workloads to maintain intensity without signs/symptoms of physical distress.      Resistance Training   Training Prescription  Yes    Weight  1    Reps  10-15       Perform Capillary Blood Glucose checks as needed.  Exercise Prescription Changes:  Exercise Prescription Changes    Row Name 03/09/18 0700 03/21/18 1000 04/03/18 1400 04/18/18 1200 05/02/18 1300     Response to Exercise   Blood Pressure (Admit)  136/70  124/68  130/60  124/66  134/76   Blood Pressure (Exercise)  148/62  150/70  138/60  140/70  146/70   Blood Pressure (Exit)  124/72  120/54  120/60  120/66  124/78   Heart Rate (Admit)  90 bpm  86 bpm  79 bpm  88 bpm  79 bpm   Heart Rate (Exercise)  104 bpm  102 bpm  98 bpm  101 bpm  99 bpm   Heart Rate (Exit)  75 bpm  94 bpm  87 bpm  95 bpm  88 bpm   Rating of Perceived Exertion (Exercise)  '13  13  14  16  15   ' Symptoms  -  -  leg and toe pain   -  -   Comments  First two weeks of exercise   -  -  -  -   Duration  Progress to 30 minutes of  aerobic without signs/symptoms of physical distress  Progress to  30 minutes of  aerobic without signs/symptoms of physical distress  Progress to 30 minutes of  aerobic without signs/symptoms of physical distress  Progress to 30 minutes of  aerobic without signs/symptoms of physical distress  Continue with 30 min of aerobic exercise without signs/symptoms of physical distress.   Intensity  THRR New 101-115-129  THRR unchanged  THRR unchanged  THRR unchanged  THRR unchanged     Progression   Progression  Continue to progress workloads to maintain intensity without signs/symptoms of physical distress.  Continue to progress workloads to maintain intensity without signs/symptoms of physical distress.  Continue to progress workloads to maintain intensity without signs/symptoms of physical distress.  Continue to progress workloads to maintain intensity without signs/symptoms of physical distress.  Continue to progress workloads to maintain intensity without signs/symptoms of physical distress.   Average METs  1.87  1.8  2.1  2  2     Resistance Training   Training Prescription  Yes  Yes  Yes  Yes  Yes   Weight  '1  2  3  3  3   ' Reps  10-15  10-15  10-15  10-15  10-15     Treadmill   MPH  1.1  1.1  - switched to AE due to leg and toe pain  -  -   Grade  0  0  -  -  -   Minutes  17  17  -  -  -   METs  1.84  1.8  -  -  -     NuStep   Level  '1  2  2  2  2   ' SPM  85  77  84  71  78   Minutes  '22  22  22  22  22   ' METs  1.9  1.8  2.1  1.8  2.1     Arm Ergometer   Level  -  -  1.'5  2  2   ' Watts  -  -  '12  13  19   ' Minutes  -  -  '22  17  17   ' METs  -  -  2.1  2.2  1.9      Exercise Comments:  Exercise Comments    Row Name 03/13/18 1434 04/03/18 1445 04/25/18 0811       Exercise Comments  Patient is doing well in the program, he has attended 9 sessions so far. He is working to get to feeling better, stronger and lose weight.   Patient continues to attend regularly. He has done well with his exercise especially after being switched from the TM to he Arm  Ergometer. He is increasing his MET level daily.   Since switching from the TM to the Arm Ergometer the patient feels his stamina has increased because he is able to tolerate the AE longer and at a higher pace than the treadmill. He is still trying to get to the root of his leg and back pain so we will continue to monitor and progress/adjust as needed.         Exercise Goals and Review:  Exercise Goals    Row Name 02/17/18 1417             Exercise Goals   Increase Physical Activity  Yes       Intervention  Provide advice, education, support and counseling about physical activity/exercise needs.;Develop an individualized exercise prescription for aerobic and resistive training based on initial evaluation findings, risk stratification, comorbidities and participant's personal goals.       Expected Outcomes  Short Term: Attend rehab on a regular basis to increase amount of physical activity.;Long Term: Add in home exercise to make exercise part of routine and to increase amount of physical activity.;Long Term: Exercising regularly at least 3-5 days a week.       Increase Strength and Stamina  Yes       Intervention  Provide advice, education, support and counseling about physical activity/exercise needs.;Develop an individualized exercise prescription for aerobic and resistive training based on initial evaluation findings, risk stratification, comorbidities and participant's personal goals.       Expected Outcomes  Short Term: Increase workloads from initial exercise prescription for resistance, speed, and METs.;Short Term: Perform resistance training exercises routinely during rehab and add in resistance training at home;Long Term: Improve cardiorespiratory  fitness, muscular endurance and strength as measured by increased METs and functional capacity (6MWT)       Able to understand and use rate of perceived exertion (RPE) scale  Yes       Intervention  Provide education and explanation on how to  use RPE scale       Expected Outcomes  Short Term: Able to use RPE daily in rehab to express subjective intensity level;Long Term:  Able to use RPE to guide intensity level when exercising independently       Able to understand and use Dyspnea scale  Yes       Intervention  Provide education and explanation on how to use Dyspnea scale       Expected Outcomes  Short Term: Able to use Dyspnea scale daily in rehab to express subjective sense of shortness of breath during exertion;Long Term: Able to use Dyspnea scale to guide intensity level when exercising independently       Knowledge and understanding of Target Heart Rate Range (THRR)  Yes       Intervention  Provide education and explanation of THRR including how the numbers were predicted and where they are located for reference       Expected Outcomes  Short Term: Able to state/look up THRR;Short Term: Able to use daily as guideline for intensity in rehab;Long Term: Able to use THRR to govern intensity when exercising independently       Able to check pulse independently  Yes       Intervention  Provide education and demonstration on how to check pulse in carotid and radial arteries.;Review the importance of being able to check your own pulse for safety during independent exercise       Expected Outcomes  Short Term: Able to explain why pulse checking is important during independent exercise;Long Term: Able to check pulse independently and accurately       Understanding of Exercise Prescription  Yes       Intervention  Provide education, explanation, and written materials on patient's individual exercise prescription       Expected Outcomes  Short Term: Able to explain program exercise prescription;Long Term: Able to explain home exercise prescription to exercise independently          Exercise Goals Re-Evaluation : Exercise Goals Re-Evaluation    Row Name 03/13/18 1432 04/03/18 1444 04/25/18 0758         Exercise Goal Re-Evaluation    Exercise Goals Review  Increase Physical Activity;Increase Strength and Stamina;Able to understand and use Dyspnea scale;Able to check pulse independently;Understanding of Exercise Prescription;Knowledge and understanding of Target Heart Rate Range (THRR);Able to understand and use rate of perceived exertion (RPE) scale  Increase Physical Activity;Increase Strength and Stamina;Able to understand and use Dyspnea scale;Able to check pulse independently;Understanding of Exercise Prescription;Knowledge and understanding of Target Heart Rate Range (THRR);Able to understand and use rate of perceived exertion (RPE) scale  Increase Physical Activity;Increase Strength and Stamina;Able to understand and use Dyspnea scale;Able to check pulse independently;Understanding of Exercise Prescription;Knowledge and understanding of Target Heart Rate Range (THRR);Able to understand and use rate of perceived exertion (RPE) scale     Comments  Patient has done well in the program so far. He has tolerated the exercise well and is eager to get better. He has to watch his weight due to CHF and is strggling a bit with that. He is up to 1.55mh on the treadmill. We will continue to progress him as tolerated.  Pt. has done well in the program so far. He has been experiencing some leg and toe pain so he has been set back a little but continues to push himself to get stronger.   Pt. truly does the best he can when he attends his workout sessions. Due to some continuing leg and toe pain as well as sickness he has been an little set back but he always pushes himself on the Arm Ergometer and NuStep when he can.     Expected Outcomes  Feel better and have the ability to do more without getting tired.   Feel better and have the ability to do more without getting SOB  Feel better and have the ability to do more without getting SOB         Discharge Exercise Prescription (Final Exercise Prescription Changes): Exercise Prescription Changes -  05/02/18 1300      Response to Exercise   Blood Pressure (Admit)  134/76    Blood Pressure (Exercise)  146/70    Blood Pressure (Exit)  124/78    Heart Rate (Admit)  79 bpm    Heart Rate (Exercise)  99 bpm    Heart Rate (Exit)  88 bpm    Rating of Perceived Exertion (Exercise)  15    Duration  Continue with 30 min of aerobic exercise without signs/symptoms of physical distress.    Intensity  THRR unchanged      Progression   Progression  Continue to progress workloads to maintain intensity without signs/symptoms of physical distress.    Average METs  2      Resistance Training   Training Prescription  Yes    Weight  3    Reps  10-15      NuStep   Level  2    SPM  78    Minutes  22    METs  2.1      Arm Ergometer   Level  2    Watts  19    Minutes  17    METs  1.9       Nutrition:  Target Goals: Understanding of nutrition guidelines, daily intake of sodium <1555m, cholesterol <2031m calories 30% from fat and 7% or less from saturated fats, daily to have 5 or more servings of fruits and vegetables.  Biometrics: Pre Biometrics - 02/17/18 1418      Pre Biometrics   Height  '5\' 9"'  (1.753 m)    Weight  128.8 kg    Waist Circumference  58.5 inches    Hip Circumference  56.5 inches    Waist to Hip Ratio  1.04 %    BMI (Calculated)  41.91    Triceps Skinfold  12 mm    % Body Fat  41.6 %    Grip Strength  19.03 kg    Flexibility  0 in    Single Leg Stand  1.52 seconds        Nutrition Therapy Plan and Nutrition Goals: Nutrition Therapy & Goals - 03/03/18 1428      Personal Nutrition Goals   Nutrition Goal  For heart healthy choices add >50% of whole grains, make half their plate fruits and vegetables. Discuss the difference between starchy vegetables and leafy greens, and how leafy vegetables provide fiber, helps maintain healthy weight, helps control blood glucose, and lowers cholesterol.  Discuss purchasing fresh or frozen vegetable to reduce sodium and not to  add grease, fat or sugar. Consume <18oz of red meat  per week. Consume lean cuts of meats and very little of meats high in sodium and nitrates such as pork and lunch meats. Discussed portion control for all food groups.      Comments  Patient attended RD class 03/02/18.       Nutrition Assessments: Nutrition Assessments - 02/17/18 1448      MEDFICTS Scores   Pre Score  41       Nutrition Goals Re-Evaluation: Nutrition Goals Re-Evaluation    Row Name 03/13/18 1513 04/03/18 1453 04/27/18 0739         Goals   Current Weight  274 lb (124.3 kg)  288 lb (130.6 kg)  285 lb (129.3 kg)     Nutrition Goal  For heart healthy choices add >50% of whole grains, make half their plate fruits and vegetables. Discuss the difference between starchy vegetables and leafy greens, and how leafy vegetables provide fiber, helps maintain healthy weight, helps control blood glucose, and lowers cholesterol.  Discuss purchasing fresh or frozen vegetable to reduce sodium and not to add grease, fat or sugar. Consume <18oz of red meat per week. Consume lean cuts of meats and very little of meats high in sodium and nitrates such as pork and lunch meats. Discussed portion control for all food groups.    For heart healthy choices add >50% of whole grains, make half their plate fruits and vegetables. Discuss the difference between starchy vegetables and leafy greens, and how leafy vegetables provide fiber, helps maintain healthy weight, helps control blood glucose, and lowers cholesterol.  Discuss purchasing fresh or frozen vegetable to reduce sodium and not to add grease, fat or sugar. Consume <18oz of red meat per week. Consume lean cuts of meats and very little of meats high in sodium and nitrates such as pork and lunch meats. Discussed portion control for all food groups.    For heart healthy choices add >50% of whole grains, make half their plate fruits and vegetables. Discuss the difference between starchy vegetables and  leafy greens, and how leafy vegetables provide fiber, helps maintain healthy weight, helps control blood glucose, and lowers cholesterol.  Discuss purchasing fresh or frozen vegetable to reduce sodium and not to add grease, fat or sugar. Consume <18oz of red meat per week. Consume lean cuts of meats and very little of meats high in sodium and nitrates such as pork and lunch meats. Discussed portion control for all food groups.       Comment  Patient has lost 10 lbs since his initial visit. He says he is trying to eat healthier. He has met with RD. Will continue to monitor.   Patient has gained 14 lbs since last 30 day review. His weight does fluctuate. He says he is eating more now due to the holidays but he is also concerned with the possibility of fluid retention. He has gained 6 lbs in a week. He says he sees his PCP tomorrow and he is going to report the weight gain. Will continue to monitor for progress.   Patient has lost 3 lbs since last 30 day review. His weight continues to fluctuate. He says he is trying to limit his Na intake and sugar intake. Will continue to monitor for progress.      Expected Outcome  Patient will continue to work toward his nutritional goals.   Patient will continue to work toward his nutritional goals.   Patient will continue to work toward his nutritional goals.  Nutrition Goals Discharge (Final Nutrition Goals Re-Evaluation): Nutrition Goals Re-Evaluation - 04/27/18 0739      Goals   Current Weight  285 lb (129.3 kg)    Nutrition Goal  For heart healthy choices add >50% of whole grains, make half their plate fruits and vegetables. Discuss the difference between starchy vegetables and leafy greens, and how leafy vegetables provide fiber, helps maintain healthy weight, helps control blood glucose, and lowers cholesterol.  Discuss purchasing fresh or frozen vegetable to reduce sodium and not to add grease, fat or sugar. Consume <18oz of red meat per week. Consume  lean cuts of meats and very little of meats high in sodium and nitrates such as pork and lunch meats. Discussed portion control for all food groups.      Comment  Patient has lost 3 lbs since last 30 day review. His weight continues to fluctuate. He says he is trying to limit his Na intake and sugar intake. Will continue to monitor for progress.     Expected Outcome  Patient will continue to work toward his nutritional goals.        Psychosocial: Target Goals: Acknowledge presence or absence of significant depression and/or stress, maximize coping skills, provide positive support system. Participant is able to verbalize types and ability to use techniques and skills needed for reducing stress and depression.  Initial Review & Psychosocial Screening: Initial Psych Review & Screening - 02/17/18 1445      Initial Review   Current issues with  None Identified      Family Dynamics   Good Support System?  Yes    Concerns  --   Would like to have a bewtter relationship with his sons.      Barriers   Psychosocial barriers to participate in program  There are no identifiable barriers or psychosocial needs.      Screening Interventions   Interventions  Encouraged to exercise    Expected Outcomes  Short Term goal: Identification and review with participant of any Quality of Life or Depression concerns found by scoring the questionnaire.;Long Term goal: The participant improves quality of Life and PHQ9 Scores as seen by post scores and/or verbalization of changes       Quality of Life Scores: Quality of Life - 02/17/18 1422      Quality of Life   Select  Quality of Life      Quality of Life Scores   Health/Function Pre  17.13 %    Socioeconomic Pre  19.07 %    Psych/Spiritual Pre  19.36 %    Family Pre  20.4 %    GLOBAL Pre  18.47 %      Scores of 19 and below usually indicate a poorer quality of life in these areas.  A difference of  2-3 points is a clinically meaningful difference.   A difference of 2-3 points in the total score of the Quality of Life Index has been associated with significant improvement in overall quality of life, self-image, physical symptoms, and general health in studies assessing change in quality of life.  PHQ-9: Recent Review Flowsheet Data    Depression screen [facility] 2/9 02/17/2018   Decreased Interest 0   Down, Depressed, Hopeless 0   PHQ - 2 Score 0   Altered sleeping 1   Tired, decreased energy 2   Change in appetite 1   Feeling bad or failure about yourself  1   Trouble concentrating 1   Moving slowly or fidgety/restless  0   Suicidal thoughts 0   PHQ-9 Score 6   Difficult doing work/chores Somewhat difficult     Interpretation of Total Score  Total Score Depression Severity:  1-4 = Minimal depression, 5-9 = Mild depression, 10-14 = Moderate depression, 15-19 = Moderately severe depression, 20-27 = Severe depression   Psychosocial Evaluation and Intervention: Psychosocial Evaluation - 02/17/18 1446      Psychosocial Evaluation & Interventions   Interventions  Encouraged to exercise with the program and follow exercise prescription    Continue Psychosocial Services   No Follow up required       Psychosocial Re-Evaluation: Psychosocial Re-Evaluation    Moody Name 03/13/18 1516 04/03/18 1458 04/27/18 2800         Psychosocial Re-Evaluation   Current issues with  None Identified  None Identified  None Identified     Comments  Patient initial QOL score was 18.47/and his PHQ_9 score was 6 with no psychosocial barriers identified.   Patient initial QOL score was 18.47/and his PHQ_9 score was 6 with no psychosocial barriers identified.   Patient initial QOL score was 18.47/and his PHQ_9 score was 6 with no psychosocial barriers identified.      Expected Outcomes  Patient will have no psychosocial issues identified at discharge with improved QOL and PHQ-9 scores.  Patient will have no psychosocial issues identified at discharge with improved  QOL and PHQ-9 scores.  Patient will have no psychosocial issues identified at discharge with improved QOL and PHQ-9 scores.     Interventions  Stress management education;Encouraged to attend Cardiac Rehabilitation for the exercise;Relaxation education  Stress management education;Encouraged to attend Cardiac Rehabilitation for the exercise;Relaxation education  Stress management education;Encouraged to attend Cardiac Rehabilitation for the exercise;Relaxation education     Continue Psychosocial Services   No Follow up required  No Follow up required  No Follow up required        Psychosocial Discharge (Final Psychosocial Re-Evaluation): Psychosocial Re-Evaluation - 04/27/18 0742      Psychosocial Re-Evaluation   Current issues with  None Identified    Comments  Patient initial QOL score was 18.47/and his PHQ_9 score was 6 with no psychosocial barriers identified.     Expected Outcomes  Patient will have no psychosocial issues identified at discharge with improved QOL and PHQ-9 scores.    Interventions  Stress management education;Encouraged to attend Cardiac Rehabilitation for the exercise;Relaxation education    Continue Psychosocial Services   No Follow up required       Vocational Rehabilitation: Provide vocational rehab assistance to qualifying candidates.   Vocational Rehab Evaluation & Intervention: Vocational Rehab - 02/17/18 1449      Initial Vocational Rehab Evaluation & Intervention   Assessment shows need for Vocational Rehabilitation  No       Education: Education Goals: Education classes will be provided on a weekly basis, covering required topics. Participant will state understanding/return demonstration of topics presented.  Learning Barriers/Preferences: Learning Barriers/Preferences - 02/17/18 1448      Learning Barriers/Preferences   Learning Barriers  None    Learning Preferences  Group Instruction;Individual Instruction;Skilled Demonstration        Education Topics: Hypertension, Hypertension Reduction -Define heart disease and high blood pressure. Discus how high blood pressure affects the body and ways to reduce high blood pressure.   CARDIAC REHAB PHASE II EXERCISE from 04/26/2018 in [place]  Date  02/22/18  Educator  D. Coad  Instruction Review Code  2- Demonstrated Understanding  Exercise and Your Heart -Discuss why it is important to exercise, the FITT principles of exercise, normal and abnormal responses to exercise, and how to exercise safely.   CARDIAC REHAB PHASE II EXERCISE from 04/26/2018 in [place]  Date  03/01/18  Educator  Coad  Instruction Review Code  2- Demonstrated Understanding      Angina -Discuss definition of angina, causes of angina, treatment of angina, and how to decrease risk of having angina.   CARDIAC REHAB PHASE II EXERCISE from 04/26/2018 in [place]  Date  03/08/18  Educator  Etheleen Mayhew   Instruction Review Code  2- Demonstrated Understanding      Cardiac Medications -Review what the following cardiac medications are used for, how they affect the body, and side effects that may occur when taking the medications.  Medications include Aspirin, Beta blockers, calcium channel blockers, ACE Inhibitors, angiotensin receptor blockers, diuretics, digoxin, and antihyperlipidemics.   CARDIAC REHAB PHASE II EXERCISE from 04/26/2018 in [place]  Date  03/15/18  Educator  Wynetta Emery  Instruction Review Code  2- Demonstrated Understanding      Congestive Heart Failure -Discuss the definition of CHF, how to live with CHF, the signs and symptoms of CHF, and how keep track of weight and sodium intake.   CARDIAC REHAB PHASE II EXERCISE from 04/26/2018 in [place]  Date  03/22/18  Educator  Wynetta Emery  Instruction Review Code  2- Demonstrated Understanding      Heart Disease and  Intimacy -Discus the effect sexual activity has on the heart, how changes occur during intimacy as we age, and safety during sexual activity.   CARDIAC REHAB PHASE II EXERCISE from 04/26/2018 in [place]  Date  03/29/18  Educator  Wynetta Emery  Instruction Review Code  2- Demonstrated Understanding      Smoking Cessation / COPD -Discuss different methods to quit smoking, the health benefits of quitting smoking, and the definition of COPD.   CARDIAC REHAB PHASE II EXERCISE from 04/26/2018 in [place]  Date  04/05/18  Educator  Wynetta Emery  Instruction Review Code  2- Demonstrated Understanding      Nutrition I: Fats -Discuss the types of cholesterol, what cholesterol does to the heart, and how cholesterol levels can be controlled.   Nutrition II: Labels -Discuss the different components of food labels and how to read food label   CARDIAC REHAB PHASE II EXERCISE from 04/26/2018 in [place]  Date  04/21/18  Educator  Wynetta Emery  Instruction Review Code  2- Demonstrated Understanding      Heart Parts/Heart Disease and PAD -Discuss the anatomy of the heart, the pathway of blood circulation through the heart, and these are affected by heart disease.   CARDIAC REHAB PHASE II EXERCISE from 04/26/2018 in [place]  Date  04/26/18  Educator  DJ  Instruction Review Code  2- Demonstrated Understanding      Stress I: Signs and Symptoms -Discuss the causes of stress, how stress may lead to anxiety and depression, and ways to limit stress.   Stress II: Relaxation -Discuss different types of relaxation techniques to limit stress.   Warning Signs of Stroke / TIA -Discuss definition of a stroke, what the signs and symptoms are of a stroke, and how to identify when someone is having stroke.   Knowledge Questionnaire Score: Knowledge Questionnaire Score - 02/17/18 1448      Knowledge Questionnaire Score  Pre Score  21/24       Core Components/Risk Factors/Patient Goals at Admission: Personal Goals and Risk Factors at Admission - 02/17/18 1449      Core Components/Risk Factors/Patient Goals on Admission    Weight Management  Weight Loss    Personal Goal Other  Yes    Personal Goal  To feel better, be able to do more w/o getting tired.     Intervention  Attend CR 3 x week and supplement 2 x week with at home exercise    Expected Outcomes  Reach personal goals.        Core Components/Risk Factors/Patient Goals Review:  Goals and Risk Factor Review    Row Name 03/13/18 1514 04/03/18 1456 04/27/18 0741         Core Components/Risk Factors/Patient Goals Review   Personal Goals Review  Weight Management/Obesity Feel better; do more without getting tired.   Weight Management/Obesity Feel better; do more w/o getting fatigue.   Weight Management/Obesity;Diabetes Feel better; do more w/o getting tired.      Review  Patient has completed 9 sessions losing 10 lbs since his initial visit. He is doing well in the program with some progression. He says he feels like his stamina has improved and feels better overall. He is pleased with his progress so far and hopes to gain more as he continues.   Patient has completed 17 sessions gaining 14 lbs since last 30 day review. He is doing well in the program with some progression. He was taken off of the treadmill due to pain in his feet. He is not doing the nustep and eromogeter. He says he is feeling stronger and feels he is improving overall. Will continue to montior for progress.   Patient has completed 22 sessions losing 3 lbs since last 30 day reivew. He continues to do well in the program with progression. He says he feels better overall and is sleeping better since starting the program. He says he is much more activity at home helping out with chores and just more active in general w/o getting tired. His last A1C wqas 10/19 at 8.5 mg/dl. His reported  fasting glucose reading are usually greater than 150 mg/dl.  He feels he has gained a lot more upper body strength and is pleased with his progress. Will continue to monitor.      Expected Outcomes  Patient will continue to attend sessions and complete the program meeting his personal goals.   Patient will continue to attend sessions and complete the program meeting his personal goals.   Patient will continue to attend sessions and complete the program meeting his personal goals.         Core Components/Risk Factors/Patient Goals at Discharge (Final Review):  Goals and Risk Factor Review - 04/27/18 0741      Core Components/Risk Factors/Patient Goals Review   Personal Goals Review  Weight Management/Obesity;Diabetes   Feel better; do more w/o getting tired.    Review  Patient has completed 22 sessions losing 3 lbs since last 30 day reivew. He continues to do well in the program with progression. He says he feels better overall and is sleeping better since starting the program. He says he is much more activity at home helping out with chores and just more active in general w/o getting tired. His last A1C wqas 10/19 at 8.5 mg/dl. His reported fasting glucose reading are usually greater than 150 mg/dl.  He feels he has gained a lot  more upper body strength and is pleased with his progress. Will continue to monitor.     Expected Outcomes  Patient will continue to attend sessions and complete the program meeting his personal goals.        ITP Comments: ITP Comments    Row Name 02/17/18 1319 02/20/18 1506 05/15/18 1452       ITP Comments  Patient is coming to CR per Dr. Selinda Michaels request due to CHF. Had to delay starting due to needing pacemaker placement. He is eager to get started. He also inform us of his balance issues.   Patient new to the program. He has completed 2 sessions. Will continue to monitor for progress.   Patient stopped attending the program after completing 22 sessions due to  health issues and lack of attendance. MD will be notified.         Comments: Patient stopped coming to Cardiac Rehab on 04/26/18 after completing 22 sessions due to health issues and lack of attendance. Called patient to remind them of the Cardiac Rehab policy that if they do not call or come for 3 consecutive visits that they would be discharged from the CR program. Doctor will be informed.

## 2018-05-15 NOTE — Addendum Note (Signed)
Encounter addended by: Suann Larry, RN on: 05/15/2018 3:00 PM  Actions taken: Visit Navigator Flowsheet section accepted, Clinical Note Signed

## 2018-05-16 ENCOUNTER — Ambulatory Visit (INDEPENDENT_AMBULATORY_CARE_PROVIDER_SITE_OTHER): Payer: Medicare HMO | Admitting: Pharmacist

## 2018-05-16 DIAGNOSIS — I4891 Unspecified atrial fibrillation: Secondary | ICD-10-CM

## 2018-05-16 DIAGNOSIS — Z5181 Encounter for therapeutic drug level monitoring: Secondary | ICD-10-CM | POA: Diagnosis not present

## 2018-05-16 DIAGNOSIS — I255 Ischemic cardiomyopathy: Secondary | ICD-10-CM | POA: Diagnosis not present

## 2018-05-16 LAB — POCT INR: INR: 2.2 (ref 2.0–3.0)

## 2018-05-16 NOTE — Patient Instructions (Signed)
Description   Continue 1/2 tablet daily except 1 tablet on Tuesdays Recheck in 10 days.

## 2018-05-17 ENCOUNTER — Inpatient Hospital Stay (HOSPITAL_COMMUNITY)
Admission: AD | Admit: 2018-05-17 | Discharge: 2018-05-23 | DRG: 085 | Disposition: A | Discharge status: Skilled Nursing Facility | Payer: Medicare HMO | Source: Other Acute Inpatient Hospital | Attending: Family Medicine | Admitting: Family Medicine

## 2018-05-17 ENCOUNTER — Observation Stay (HOSPITAL_COMMUNITY): Payer: Medicare HMO

## 2018-05-17 DIAGNOSIS — N179 Acute kidney failure, unspecified: Secondary | ICD-10-CM | POA: Diagnosis not present

## 2018-05-17 DIAGNOSIS — R7989 Other specified abnormal findings of blood chemistry: Secondary | ICD-10-CM | POA: Diagnosis not present

## 2018-05-17 DIAGNOSIS — S066X0A Traumatic subarachnoid hemorrhage without loss of consciousness, initial encounter: Secondary | ICD-10-CM | POA: Diagnosis not present

## 2018-05-17 DIAGNOSIS — S199XXA Unspecified injury of neck, initial encounter: Secondary | ICD-10-CM | POA: Diagnosis not present

## 2018-05-17 DIAGNOSIS — M5489 Other dorsalgia: Secondary | ICD-10-CM | POA: Diagnosis not present

## 2018-05-17 DIAGNOSIS — I255 Ischemic cardiomyopathy: Secondary | ICD-10-CM | POA: Diagnosis not present

## 2018-05-17 DIAGNOSIS — Y92012 Bathroom of single-family (private) house as the place of occurrence of the external cause: Secondary | ICD-10-CM | POA: Diagnosis not present

## 2018-05-17 DIAGNOSIS — E785 Hyperlipidemia, unspecified: Secondary | ICD-10-CM | POA: Diagnosis not present

## 2018-05-17 DIAGNOSIS — Z87891 Personal history of nicotine dependence: Secondary | ICD-10-CM

## 2018-05-17 DIAGNOSIS — I609 Nontraumatic subarachnoid hemorrhage, unspecified: Secondary | ICD-10-CM

## 2018-05-17 DIAGNOSIS — F5101 Primary insomnia: Secondary | ICD-10-CM | POA: Diagnosis not present

## 2018-05-17 DIAGNOSIS — Z881 Allergy status to other antibiotic agents status: Secondary | ICD-10-CM | POA: Diagnosis not present

## 2018-05-17 DIAGNOSIS — N183 Chronic kidney disease, stage 3 unspecified: Secondary | ICD-10-CM

## 2018-05-17 DIAGNOSIS — I480 Paroxysmal atrial fibrillation: Secondary | ICD-10-CM | POA: Diagnosis present

## 2018-05-17 DIAGNOSIS — Z95810 Presence of automatic (implantable) cardiac defibrillator: Secondary | ICD-10-CM

## 2018-05-17 DIAGNOSIS — R55 Syncope and collapse: Secondary | ICD-10-CM

## 2018-05-17 DIAGNOSIS — W1839XA Other fall on same level, initial encounter: Secondary | ICD-10-CM | POA: Diagnosis not present

## 2018-05-17 DIAGNOSIS — I130 Hypertensive heart and chronic kidney disease with heart failure and stage 1 through stage 4 chronic kidney disease, or unspecified chronic kidney disease: Secondary | ICD-10-CM | POA: Diagnosis present

## 2018-05-17 DIAGNOSIS — Z794 Long term (current) use of insulin: Secondary | ICD-10-CM

## 2018-05-17 DIAGNOSIS — W1830XA Fall on same level, unspecified, initial encounter: Secondary | ICD-10-CM | POA: Diagnosis not present

## 2018-05-17 DIAGNOSIS — E1142 Type 2 diabetes mellitus with diabetic polyneuropathy: Secondary | ICD-10-CM | POA: Diagnosis present

## 2018-05-17 DIAGNOSIS — E1122 Type 2 diabetes mellitus with diabetic chronic kidney disease: Secondary | ICD-10-CM | POA: Diagnosis present

## 2018-05-17 DIAGNOSIS — S299XXA Unspecified injury of thorax, initial encounter: Secondary | ICD-10-CM | POA: Diagnosis not present

## 2018-05-17 DIAGNOSIS — I4891 Unspecified atrial fibrillation: Secondary | ICD-10-CM | POA: Diagnosis not present

## 2018-05-17 DIAGNOSIS — R944 Abnormal results of kidney function studies: Secondary | ICD-10-CM | POA: Diagnosis not present

## 2018-05-17 DIAGNOSIS — J9811 Atelectasis: Secondary | ICD-10-CM | POA: Diagnosis not present

## 2018-05-17 DIAGNOSIS — Y92009 Unspecified place in unspecified non-institutional (private) residence as the place of occurrence of the external cause: Secondary | ICD-10-CM

## 2018-05-17 DIAGNOSIS — Z7982 Long term (current) use of aspirin: Secondary | ICD-10-CM | POA: Diagnosis not present

## 2018-05-17 DIAGNOSIS — K210 Gastro-esophageal reflux disease with esophagitis: Secondary | ICD-10-CM | POA: Diagnosis not present

## 2018-05-17 DIAGNOSIS — I2510 Atherosclerotic heart disease of native coronary artery without angina pectoris: Secondary | ICD-10-CM | POA: Diagnosis present

## 2018-05-17 DIAGNOSIS — Z803 Family history of malignant neoplasm of breast: Secondary | ICD-10-CM | POA: Diagnosis not present

## 2018-05-17 DIAGNOSIS — S0990XA Unspecified injury of head, initial encounter: Secondary | ICD-10-CM | POA: Diagnosis not present

## 2018-05-17 DIAGNOSIS — R52 Pain, unspecified: Secondary | ICD-10-CM | POA: Diagnosis not present

## 2018-05-17 DIAGNOSIS — I5023 Acute on chronic systolic (congestive) heart failure: Secondary | ICD-10-CM | POA: Diagnosis not present

## 2018-05-17 DIAGNOSIS — Z8249 Family history of ischemic heart disease and other diseases of the circulatory system: Secondary | ICD-10-CM

## 2018-05-17 DIAGNOSIS — M5030 Other cervical disc degeneration, unspecified cervical region: Secondary | ICD-10-CM | POA: Diagnosis not present

## 2018-05-17 DIAGNOSIS — G4733 Obstructive sleep apnea (adult) (pediatric): Secondary | ICD-10-CM

## 2018-05-17 DIAGNOSIS — Z9861 Coronary angioplasty status: Secondary | ICD-10-CM | POA: Diagnosis not present

## 2018-05-17 DIAGNOSIS — Z885 Allergy status to narcotic agent status: Secondary | ICD-10-CM | POA: Diagnosis not present

## 2018-05-17 DIAGNOSIS — Z7901 Long term (current) use of anticoagulants: Secondary | ICD-10-CM

## 2018-05-17 DIAGNOSIS — Z9989 Dependence on other enabling machines and devices: Secondary | ICD-10-CM

## 2018-05-17 DIAGNOSIS — E1165 Type 2 diabetes mellitus with hyperglycemia: Secondary | ICD-10-CM | POA: Diagnosis not present

## 2018-05-17 DIAGNOSIS — S065X0A Traumatic subdural hemorrhage without loss of consciousness, initial encounter: Secondary | ICD-10-CM | POA: Diagnosis not present

## 2018-05-17 DIAGNOSIS — I4892 Unspecified atrial flutter: Secondary | ICD-10-CM | POA: Diagnosis not present

## 2018-05-17 DIAGNOSIS — W19XXXA Unspecified fall, initial encounter: Secondary | ICD-10-CM

## 2018-05-17 LAB — COMPREHENSIVE METABOLIC PANEL
ALT: 18 U/L (ref 0–44)
AST: 16 U/L (ref 15–41)
Albumin: 2.7 g/dL — ABNORMAL LOW (ref 3.5–5.0)
Alkaline Phosphatase: 48 U/L (ref 38–126)
Anion gap: 11 (ref 5–15)
BUN: 30 mg/dL — ABNORMAL HIGH (ref 8–23)
CO2: 24 mmol/L (ref 22–32)
Calcium: 9.1 mg/dL (ref 8.9–10.3)
Chloride: 101 mmol/L (ref 98–111)
Creatinine, Ser: 1.4 mg/dL — ABNORMAL HIGH (ref 0.61–1.24)
GFR calc Af Amer: 56 mL/min — ABNORMAL LOW (ref >60)
GFR calc non Af Amer: 48 mL/min — ABNORMAL LOW (ref >60)
Glucose, Bld: 202 mg/dL — ABNORMAL HIGH (ref 70–99)
Potassium: 4 mmol/L (ref 3.5–5.1)
Sodium: 136 mmol/L (ref 135–145)
TOTAL PROTEIN: 5.8 g/dL — AB (ref 6.5–8.1)
Total Bilirubin: 0.7 mg/dL (ref 0.3–1.2)

## 2018-05-17 LAB — APTT: aPTT: 35 seconds (ref 24–36)

## 2018-05-17 LAB — PROTIME-INR
INR: 1.15
Prothrombin Time: 14.6 seconds (ref 11.4–15.2)

## 2018-05-17 LAB — CBC
HCT: 34.2 % — ABNORMAL LOW (ref 39.0–52.0)
Hemoglobin: 10.6 g/dL — ABNORMAL LOW (ref 13.0–17.0)
MCH: 26.9 pg (ref 26.0–34.0)
MCHC: 31 g/dL (ref 30.0–36.0)
MCV: 86.8 fL (ref 80.0–100.0)
Platelets: 148 10*3/uL — ABNORMAL LOW (ref 150–400)
RBC: 3.94 MIL/uL — ABNORMAL LOW (ref 4.22–5.81)
RDW: 12.8 % (ref 11.5–15.5)
WBC: 9.4 10*3/uL (ref 4.0–10.5)
nRBC: 0 % (ref 0.0–0.2)

## 2018-05-17 MED ORDER — SODIUM CHLORIDE 0.9 % IV SOLN
INTRAVENOUS | Status: DC
  Administered 2018-05-17: via INTRAVENOUS

## 2018-05-17 MED ORDER — ZOLPIDEM TARTRATE 5 MG PO TABS
5.0000 mg | ORAL_TABLET | Freq: Every evening | ORAL | Status: DC | PRN
  Administered 2018-05-18 – 2018-05-21 (×5): 5 mg via ORAL
  Filled 2018-05-17 (×5): qty 1

## 2018-05-17 MED ORDER — ACETAMINOPHEN 650 MG RE SUPP: 650.0000 mg | Freq: Four times a day (QID) | RECTAL | Status: DC | PRN

## 2018-05-17 MED ORDER — ACETAMINOPHEN 325 MG PO TABS
650.0000 mg | ORAL_TABLET | Freq: Four times a day (QID) | ORAL | Status: DC | PRN
  Administered 2018-05-18 – 2018-05-21 (×4): 650 mg via ORAL
  Filled 2018-05-17 (×4): qty 2

## 2018-05-17 NOTE — H&P (Signed)
History and Physical    Noah [name] DTO:[id number] DOB: 1940-07-22 DOA: 05/17/2018  PCP: Estanislado Pandy, MD Patient coming from: [facility] rocking him  HPI: Noah [name] is a 78 y.o. male with medical history significant of hypertension, hyperlipidemia, CKD 3, type 2 diabetes, CAD status post PCI, CHF, OSA presenting from [facility] for further management of subarachnoid hemorrhage.  Transfer was accepted by Dr. Ophelia Charter.  She was informed by ED provider Dr. Larose Hires that Dr. [name] from neurosurgery recommended hospitalist admission.  Patient is on Coumadin and was given vitamin K and Kcentra in the ED.      Patient states he fell this afternoon.  States he was in the bathroom urinating and felt lightheaded and fell.  He hit his head on the wall but does not think he hit it too hard.  Denies losing consciousness.  Denies having any chest pain or shortness of breath prior to the fall.  Does report having intermittent lightheadedness for the past 2 to 3 months.  Reports having headaches and neck pain for the past 2 to 3 weeks.  Reports having a syncopal episode 2 to 3 days ago while walking.  States it happened very quick and he does not recall having any lightheadedness, chest pain, or shortness of breath at that time.  States EMS came but he was not taken to the hospital at that time.  He is currently on Coumadin and aspirin.  Denies any history of seizures.  ED Course at [facility]:  Head CT: Small subarachnoid hemorrhage about the right frontal vertex.  Hemoglobin 11.3. Chest x-ray interpretation per radiologist: "Stable left lingular subsegmental atelectasis or scarring is noted with probable minimal pleural effusion."  Review of Systems: As per HPI otherwise 10 point review of systems negative.  Past Medical History:  Diagnosis Date  . CAD S/P percutaneous coronary angioplasty    remote PCIs in 1990's- last CFX PCI 2008  . Chronic kidney disease, stage III (moderate) (HCC)   . Diabetes  mellitus with complication (HCC)    with hyperglycemia and diabetic autonomic poly neuropathy  . Gastro-esophageal reflux disease with esophagitis   . Hyperlipidemia   . Hypertension   . Ischemic cardiomyopathy 01/20/2018   St Jude ICD   . Morbid obesity (HCC)   . Obstructive sleep apnea    on CPAP  . Polyneuropathy   . Primary insomnia     Past Surgical History:  Procedure Laterality Date  . BIV ICD INSERTION CRT-D N/A 01/20/2018   Procedure: BIV ICD INSERTION CRT-D;  Surgeon: Marinus Maw, MD;  Location: [facility] INVASIVE CV LAB;  Service: Cardiovascular;  Laterality: N/A;  . BIV PACEMAKER INSERTION CRT-P  01/20/2018   St Jude- Dr Ladona Ridgel  . CARDIAC CATHETERIZATION     multiple angioplasty X 8, 4 stents      reports that he quit smoking about 17 years ago. He has a 30.00 pack-year smoking history. He has never used smokeless tobacco. He reports current alcohol use. He reports that he does not use drugs.  Allergies  Allergen Reactions  . Codeine Other (See Comments)    constipation  . Erythromycin-Sulfisoxazole Other (See Comments)    Causes infection in throat and eyes    Family History  Problem Relation Age of Onset  . Heart attack Father   . Breast cancer Mother   . CAD Mother   . Cancer Brother     Prior to Admission medications   Medication Sig Start Date End  Date Taking? Authorizing Provider  acetaminophen (TYLENOL) 650 MG CR tablet Take 1,300 mg by mouth at bedtime.    [provider]  aspirin EC 81 MG tablet Take 1 tablet (81 mg total) by mouth daily. 10/10/17   Laqueta LindenKoneswaran, Suresh A, MD  Coenzyme Q10 (COQ10) 100 MG CAPS Take 100 mg by mouth every evening.     [provider]  cyclobenzaprine (FLEXERIL) 10 MG tablet Take 10 mg by mouth 3 (three) times daily as needed for spasms. 09/16/17   [provider]  finasteride (PROSCAR) 5 MG tablet Take 5 mg by mouth every evening.  08/13/17   [provider]  FLUoxetine (PROZAC) 20 MG  capsule Take 20 mg by mouth every evening.    [provider]  furosemide (LASIX) 40 MG tablet Take 2 tabs (80mg ) by mouth every morning & 1 tab (40mg ) every evening 04/04/18   Laqueta LindenKoneswaran, Suresh A, MD  gabapentin (NEURONTIN) 100 MG capsule Take 100 mg by mouth 2 (two) times daily.  10/05/17   [provider]  glipiZIDE (GLUCOTROL XL) 10 MG 24 hr tablet Take 10 mg by mouth 2 (two) times daily.    [provider]  glucose blood (ONE TOUCH ULTRA TEST) test strip Check your sugars twice a day 06/06/12   Carlus PavlovGherghe, Cristina, MD  Insulin Glargine-Lixisenatide (SOLIQUA) 100-33 UNT-MCG/ML SOPN Inject 40 Units into the skin every evening.    [provider]  Insulin Pen Needle (NOVOFINE) 30G X 8 MM MISC Inject 10 each into the skin as needed. 06/20/12   Carlus PavlovGherghe, Cristina, MD  isosorbide mononitrate (IMDUR) 30 MG 24 hr tablet Take 30 mg by mouth every evening.  12/22/17   [provider]  metFORMIN (GLUCOPHAGE-XR) 500 MG 24 hr tablet Take 1,500 mg by mouth daily.    [provider]  metoprolol (TOPROL-XL) 200 MG 24 hr tablet Take 100 mg by mouth 2 (two) times daily.    [provider]  Multiple Vitamin (MULTIVITAMIN) capsule Take 1 capsule by mouth daily.     [provider]  Omega-3 Fatty Acids (FISH OIL) 1000 MG CAPS Take 1,000 mg by mouth 2 (two) times daily.     [provider]  omeprazole (PRILOSEC) 20 MG capsule Take 20 mg by mouth daily.    [provider]  ranitidine (ZANTAC) 150 MG tablet Take 150 mg by mouth at bedtime.    [provider]  sacubitril-valsartan (ENTRESTO) 49-51 MG Take 1 tablet by mouth 2 (two) times daily. 12/16/17   Laqueta LindenKoneswaran, Suresh A, MD  simvastatin (ZOCOR) 80 MG tablet TAKE 80 MG (1 TABLET) DAILY. Patient taking differently: Take 80 mg by mouth every evening. TAKE 80 MG (1 TABLET) DAILY. 10/10/17   Laqueta LindenKoneswaran, Suresh A, MD  Tamsulosin HCl (FLOMAX) 0.4 MG CAPS Take 0.8 mg by mouth every  evening.    [provider]  warfarin (COUMADIN) 5 MG tablet Take 1/2 tablet daily except 1 tablet on Tuesdays, Thursdays and Saturdays 03/20/18   Laqueta LindenKoneswaran, Suresh A, MD  zolpidem (AMBIEN) 10 MG tablet Take 10 mg by mouth at bedtime.     [provider]    Physical Exam: Vitals:   05/17/18 2008 05/17/18 2200 05/18/18 0000  BP: (!) 148/84 133/76 (!) 154/73  Pulse: 84 94 97  Resp: 18  20  Temp: (!) 97.5 F (36.4 C)  97.8 F (36.6 C)  TempSrc: Oral  Oral  SpO2: 97%  91%  Weight: 130.6 kg    Height:  5\' 8"  (1.727 m)      Physical Exam  Constitutional: He is oriented to person, place, and time. He appears well-developed and well-nourished. No distress.  HENT:  Head: Normocephalic and atraumatic.  Mouth/Throat: Oropharynx is clear and moist.  Eyes: EOM are normal. Right eye exhibits no discharge. Left eye exhibits no discharge.  Pupils not symmetric.  Left pupil reactive to light.  Right pupil dilated and not reactive to light.  Neck: Neck supple.  Cardiovascular: Normal rate, regular rhythm and intact distal pulses.  Pulmonary/Chest: Effort normal and breath sounds normal. No respiratory distress. He has no wheezes.  Mild rales at the L lung base  Abdominal: Soft. Bowel sounds are normal. He exhibits no distension. There is no abdominal tenderness. There is no guarding.  Musculoskeletal:        General: No edema.  Neurological: He is alert and oriented to person, place, and time. No cranial nerve deficit.  Speech fluent, tongue midline, no facial droop. Strength 5 out of 5 in bilateral upper and lower extremities.   Sensation to light touch intact throughout.  Skin: Skin is warm and dry. He is not diaphoretic.  Psychiatric: He has a normal mood and affect. His behavior is normal.     Labs on Admission: I have personally reviewed following labs and imaging studies  CBC: Recent Labs  Lab 05/17/18 2228  WBC 9.4  HGB 10.6*  HCT 34.2*  MCV 86.8  PLT 148*     Basic Metabolic Panel: Recent Labs  Lab 05/17/18 2228  NA 136  K 4.0  CL 101  CO2 24  GLUCOSE 202*  BUN 30*  CREATININE 1.40*  CALCIUM 9.1   GFR: Estimated Creatinine Clearance: 58.3 mL/min (A) (by C-G formula based on SCr of 1.4 mg/dL (H)). Liver Function Tests: Recent Labs  Lab 05/17/18 2228  AST 16  ALT 18  ALKPHOS 48  BILITOT 0.7  PROT 5.8*  ALBUMIN 2.7*   No results for input(s): LIPASE, AMYLASE in the last 168 hours. No results for input(s): AMMONIA in the last 168 hours. Coagulation Profile: Recent Labs  Lab 05/16/18 1401 05/17/18 2228  INR 2.2 1.15   Cardiac Enzymes: No results for input(s): CKTOTAL, CKMB, CKMBINDEX, TROPONINI in the last 168 hours. BNP (last 3 results) No results for input(s): PROBNP in the last 8760 hours. HbA1C: No results for input(s): HGBA1C in the last 72 hours. CBG: Recent Labs  Lab 05/18/18 0008  GLUCAP 141*   Lipid Profile: No results for input(s): CHOL, HDL, LDLCALC, TRIG, CHOLHDL, LDLDIRECT in the last 72 hours. Thyroid Function Tests: No results for input(s): TSH, T4TOTAL, FREET4, T3FREE, THYROIDAB in the last 72 hours. Anemia Panel: No results for input(s): VITAMINB12, FOLATE, FERRITIN, TIBC, IRON, RETICCTPCT in the last 72 hours. Urine analysis: No results found for: COLORURINE, APPEARANCEUR, LABSPEC, PHURINE, GLUCOSEU, HGBUR, BILIRUBINUR, KETONESUR, PROTEINUR, UROBILINOGEN, NITRITE, LEUKOCYTESUR  Radiological Exams on Admission: Ct Head Wo Contrast  Result Date: 05/17/2018 CLINICAL DATA:  Head trauma.  Subarachnoid hemorrhage. EXAM: CT HEAD WITHOUT CONTRAST CT CERVICAL SPINE WITHOUT CONTRAST TECHNIQUE: Multidetector CT imaging of the head and cervical spine was performed following the standard protocol without intravenous contrast. Multiplanar CT image reconstructions of the cervical spine were also generated. COMPARISON:  05/17/2018 FINDINGS: CT HEAD FINDINGS Brain: Again noted is subarachnoid hemorrhage over the  high right frontal lobe, stable. No new areas of hemorrhage. Diffuse cerebral atrophy and chronic small vessel disease. No mass effect or midline shift. Vascular: No hyperdense vessel or  unexpected calcification. Skull: No acute calvarial abnormality. Sinuses/Orbits: Visualized paranasal sinuses and mastoids clear. Orbital soft tissues unremarkable. Other: None CT CERVICAL SPINE FINDINGS Alignment: No subluxation Skull base and vertebrae: No acute fracture. No primary bone lesion or focal pathologic process. Soft tissues and spinal canal: No prevertebral fluid or swelling. No visible canal hematoma. Disc levels: Diffuse degenerative disc disease with disc space narrowing and spurring, most pronounced at C5-6 and C6-7. Upper chest: No acute findings Other: None IMPRESSION: Stable small subarachnoid hemorrhage over the high right frontal region. 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 No acute bony abnormality. Electronically Signed   By: Charlett Nose M.D.   On: 05/17/2018 23:10   Ct Cervical Spine Wo Contrast  Result Date: 05/17/2018 CLINICAL DATA:  Head trauma.  Subarachnoid hemorrhage. EXAM: CT HEAD WITHOUT CONTRAST CT CERVICAL SPINE WITHOUT CONTRAST TECHNIQUE: Multidetector CT imaging of the head and cervical spine was performed following the standard protocol without intravenous contrast. Multiplanar CT image reconstructions of the cervical spine were also generated. COMPARISON:  05/17/2018 FINDINGS: CT HEAD FINDINGS Brain: Again noted is subarachnoid hemorrhage over the high right frontal lobe, stable. No new areas of hemorrhage. Diffuse cerebral atrophy and chronic small vessel disease. No mass effect or midline shift. Vascular: No hyperdense vessel or unexpected calcification. Skull: No acute calvarial abnormality. Sinuses/Orbits: Visualized paranasal sinuses and mastoids clear. Orbital soft tissues unremarkable. Other: None CT CERVICAL SPINE FINDINGS Alignment: No subluxation Skull base  and vertebrae: No acute fracture. No primary bone lesion or focal pathologic process. Soft tissues and spinal canal: No prevertebral fluid or swelling. No visible canal hematoma. Disc levels: Diffuse degenerative disc disease with disc space narrowing and spurring, most pronounced at C5-6 and C6-7. Upper chest: No acute findings Other: None IMPRESSION: Stable small subarachnoid hemorrhage over the high right frontal region. 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 No acute bony abnormality. Electronically Signed   By: Charlett Nose M.D.   On: 05/17/2018 23:10    Assessment/Plan Principal Problem:   Subarachnoid hemorrhage (HCC) Active Problems:   Fall at home, initial encounter   Syncope   CKD (chronic kidney disease) stage 3, GFR 30-59 ml/min (HCC)   OSA (obstructive sleep apnea)   Subarachnoid hemorrhage in the setting of recent falls -Hemodynamically stable -Currently on Coumadin and received vitamin K K and K Centra in the ED.  Repeat INR 1.15. -Hemoglobin 11.3 at outside hospital, repeat 10.6 here.  Hemoglobin was 12.3 four months ago. -Head CT done at outside hospital showing small subarachnoid hemorrhage about the right frontal vertex.  -On examination, noted to have a dilated and nonreactive right pupil.  Patient states he has previously had a surgery done on this eye and there was an associated complication.  Remainder of neuro exam nonfocal. -Repeat head CT done here showing stable small subarachnoid hemorrhage over the high right frontal region. -CT C-spine showing degenerative disc disease throughout the cervical spine; no acute abnormality. -I spoke to neurosurgery PA Verlin Dike who suggested no formal neurosurgery consultation is needed at this time.  Recommended holding Coumadin for 5 to 6 days. -Tylenol PRN headaches/neck pain -Hold Coumadin -Continue to monitor CBC -Frequent neurochecks  Syncope Patient reports having a syncopal episode 2 to 3 days ago  with no prodrome. -Cardiac monitoring -Echocardiogram -Carotid Dopplers. -Orthostatics -Fall precautions  CKD 3 -Stable. Creatinine 1.4, at baseline.  OSA -CPAP at night  Type 2 diabetes CBG 141. -Sliding scale insulin sensitive -CBG checks  Atrial fibrillation -Stable.  Hold  Coumadin at this time.  Unable to safely order home medications at this time as no pharmacy medication reconciliation has been done.  DVT prophylaxis: SCDs Code Status: Patient wishes to be full code. Family Communication: No family available. Disposition Plan: Anticipate discharge in 1 to 2 days. Consults called: None Admission status: Observation   John GiovanniVasundhra Krystal Delduca MD [facility] Pager [phone]  If 7PM-7AM, please contact night-coverage [email or web address] Password [email or web address]  05/18/2018, 2:47 AM

## 2018-05-18 ENCOUNTER — Observation Stay (HOSPITAL_COMMUNITY): Payer: Medicare HMO

## 2018-05-18 ENCOUNTER — Encounter (HOSPITAL_COMMUNITY): Payer: Self-pay

## 2018-05-18 ENCOUNTER — Other Ambulatory Visit: Payer: Self-pay

## 2018-05-18 ENCOUNTER — Observation Stay (HOSPITAL_BASED_OUTPATIENT_CLINIC_OR_DEPARTMENT_OTHER): Payer: Medicare HMO

## 2018-05-18 DIAGNOSIS — Z9181 History of falling: Secondary | ICD-10-CM | POA: Diagnosis not present

## 2018-05-18 DIAGNOSIS — N183 Chronic kidney disease, stage 3 unspecified: Secondary | ICD-10-CM

## 2018-05-18 DIAGNOSIS — R279 Unspecified lack of coordination: Secondary | ICD-10-CM | POA: Diagnosis not present

## 2018-05-18 DIAGNOSIS — Z885 Allergy status to narcotic agent status: Secondary | ICD-10-CM | POA: Diagnosis not present

## 2018-05-18 DIAGNOSIS — K210 Gastro-esophageal reflux disease with esophagitis: Secondary | ICD-10-CM | POA: Diagnosis present

## 2018-05-18 DIAGNOSIS — S066X0A Traumatic subarachnoid hemorrhage without loss of consciousness, initial encounter: Secondary | ICD-10-CM | POA: Diagnosis not present

## 2018-05-18 DIAGNOSIS — I480 Paroxysmal atrial fibrillation: Secondary | ICD-10-CM | POA: Diagnosis not present

## 2018-05-18 DIAGNOSIS — Z8249 Family history of ischemic heart disease and other diseases of the circulatory system: Secondary | ICD-10-CM | POA: Diagnosis not present

## 2018-05-18 DIAGNOSIS — I5023 Acute on chronic systolic (congestive) heart failure: Secondary | ICD-10-CM | POA: Diagnosis not present

## 2018-05-18 DIAGNOSIS — Z95810 Presence of automatic (implantable) cardiac defibrillator: Secondary | ICD-10-CM | POA: Diagnosis not present

## 2018-05-18 DIAGNOSIS — Z7901 Long term (current) use of anticoagulants: Secondary | ICD-10-CM | POA: Diagnosis not present

## 2018-05-18 DIAGNOSIS — Y92009 Unspecified place in unspecified non-institutional (private) residence as the place of occurrence of the external cause: Secondary | ICD-10-CM

## 2018-05-18 DIAGNOSIS — W19XXXA Unspecified fall, initial encounter: Secondary | ICD-10-CM

## 2018-05-18 DIAGNOSIS — R296 Repeated falls: Secondary | ICD-10-CM | POA: Diagnosis not present

## 2018-05-18 DIAGNOSIS — F5101 Primary insomnia: Secondary | ICD-10-CM | POA: Diagnosis present

## 2018-05-18 DIAGNOSIS — I609 Nontraumatic subarachnoid hemorrhage, unspecified: Secondary | ICD-10-CM | POA: Diagnosis not present

## 2018-05-18 DIAGNOSIS — E785 Hyperlipidemia, unspecified: Secondary | ICD-10-CM | POA: Diagnosis not present

## 2018-05-18 DIAGNOSIS — Z743 Need for continuous supervision: Secondary | ICD-10-CM | POA: Diagnosis not present

## 2018-05-18 DIAGNOSIS — E1122 Type 2 diabetes mellitus with diabetic chronic kidney disease: Secondary | ICD-10-CM | POA: Diagnosis not present

## 2018-05-18 DIAGNOSIS — R55 Syncope and collapse: Secondary | ICD-10-CM

## 2018-05-18 DIAGNOSIS — E1142 Type 2 diabetes mellitus with diabetic polyneuropathy: Secondary | ICD-10-CM | POA: Diagnosis present

## 2018-05-18 DIAGNOSIS — I10 Essential (primary) hypertension: Secondary | ICD-10-CM | POA: Diagnosis not present

## 2018-05-18 DIAGNOSIS — Z7982 Long term (current) use of aspirin: Secondary | ICD-10-CM | POA: Diagnosis not present

## 2018-05-18 DIAGNOSIS — Z87891 Personal history of nicotine dependence: Secondary | ICD-10-CM | POA: Diagnosis not present

## 2018-05-18 DIAGNOSIS — W1830XA Fall on same level, unspecified, initial encounter: Secondary | ICD-10-CM | POA: Diagnosis present

## 2018-05-18 DIAGNOSIS — G4733 Obstructive sleep apnea (adult) (pediatric): Secondary | ICD-10-CM | POA: Diagnosis not present

## 2018-05-18 DIAGNOSIS — S066X0D Traumatic subarachnoid hemorrhage without loss of consciousness, subsequent encounter: Secondary | ICD-10-CM | POA: Diagnosis not present

## 2018-05-18 DIAGNOSIS — I2510 Atherosclerotic heart disease of native coronary artery without angina pectoris: Secondary | ICD-10-CM | POA: Diagnosis not present

## 2018-05-18 DIAGNOSIS — Z9861 Coronary angioplasty status: Secondary | ICD-10-CM | POA: Diagnosis not present

## 2018-05-18 DIAGNOSIS — Z881 Allergy status to other antibiotic agents status: Secondary | ICD-10-CM | POA: Diagnosis not present

## 2018-05-18 DIAGNOSIS — E1140 Type 2 diabetes mellitus with diabetic neuropathy, unspecified: Secondary | ICD-10-CM | POA: Diagnosis not present

## 2018-05-18 DIAGNOSIS — Y92012 Bathroom of single-family (private) house as the place of occurrence of the external cause: Secondary | ICD-10-CM | POA: Diagnosis not present

## 2018-05-18 DIAGNOSIS — N179 Acute kidney failure, unspecified: Secondary | ICD-10-CM | POA: Diagnosis not present

## 2018-05-18 DIAGNOSIS — Z794 Long term (current) use of insulin: Secondary | ICD-10-CM | POA: Diagnosis not present

## 2018-05-18 DIAGNOSIS — I130 Hypertensive heart and chronic kidney disease with heart failure and stage 1 through stage 4 chronic kidney disease, or unspecified chronic kidney disease: Secondary | ICD-10-CM | POA: Diagnosis not present

## 2018-05-18 DIAGNOSIS — I255 Ischemic cardiomyopathy: Secondary | ICD-10-CM | POA: Diagnosis present

## 2018-05-18 DIAGNOSIS — Z9989 Dependence on other enabling machines and devices: Secondary | ICD-10-CM

## 2018-05-18 DIAGNOSIS — Z803 Family history of malignant neoplasm of breast: Secondary | ICD-10-CM | POA: Diagnosis not present

## 2018-05-18 LAB — GLUCOSE, CAPILLARY
Glucose-Capillary: 118 mg/dL — ABNORMAL HIGH (ref 70–99)
Glucose-Capillary: 132 mg/dL — ABNORMAL HIGH (ref 70–99)
Glucose-Capillary: 140 mg/dL — ABNORMAL HIGH (ref 70–99)
Glucose-Capillary: 141 mg/dL — ABNORMAL HIGH (ref 70–99)
Glucose-Capillary: 159 mg/dL — ABNORMAL HIGH (ref 70–99)
Glucose-Capillary: 62 mg/dL — ABNORMAL LOW (ref 70–99)

## 2018-05-18 LAB — ECHOCARDIOGRAM COMPLETE
Height: 68 in
Weight: 4606.73 oz

## 2018-05-18 MED ORDER — ASPIRIN EC 81 MG PO TBEC
81.0000 mg | DELAYED_RELEASE_TABLET | Freq: Every day | ORAL | Status: DC
  Administered 2018-05-18 – 2018-05-23 (×6): 81 mg via ORAL
  Filled 2018-05-18 (×6): qty 1

## 2018-05-18 MED ORDER — INSULIN ASPART 100 UNIT/ML ~~LOC~~ SOLN
0.0000 [IU] | Freq: Three times a day (TID) | SUBCUTANEOUS | Status: DC
  Administered 2018-05-18: 2 [IU] via SUBCUTANEOUS
  Administered 2018-05-19 (×2): 5 [IU] via SUBCUTANEOUS
  Administered 2018-05-19: 2 [IU] via SUBCUTANEOUS
  Administered 2018-05-20: 8 [IU] via SUBCUTANEOUS
  Administered 2018-05-20: 5 [IU] via SUBCUTANEOUS
  Administered 2018-05-20: 8 [IU] via SUBCUTANEOUS
  Administered 2018-05-21: 11 [IU] via SUBCUTANEOUS
  Administered 2018-05-21: 8 [IU] via SUBCUTANEOUS
  Administered 2018-05-21: 5 [IU] via SUBCUTANEOUS
  Administered 2018-05-22: 3 [IU] via SUBCUTANEOUS
  Administered 2018-05-22: 8 [IU] via SUBCUTANEOUS
  Administered 2018-05-22: 5 [IU] via SUBCUTANEOUS
  Administered 2018-05-23: 3 [IU] via SUBCUTANEOUS
  Administered 2018-05-23: 5 [IU] via SUBCUTANEOUS

## 2018-05-18 MED ORDER — INSULIN ASPART 100 UNIT/ML ~~LOC~~ SOLN
0.0000 [IU] | Freq: Three times a day (TID) | SUBCUTANEOUS | Status: DC
  Administered 2018-05-18: 2 [IU] via SUBCUTANEOUS

## 2018-05-18 MED ORDER — GABAPENTIN 100 MG PO CAPS
100.0000 mg | ORAL_CAPSULE | Freq: Two times a day (BID) | ORAL | Status: DC
  Administered 2018-05-18 – 2018-05-23 (×10): 100 mg via ORAL
  Filled 2018-05-18 (×10): qty 1

## 2018-05-18 MED ORDER — PERFLUTREN LIPID MICROSPHERE
INTRAVENOUS | Status: AC
  Administered 2018-05-18: 3 mL via INTRAVENOUS
  Filled 2018-05-18: qty 10

## 2018-05-18 MED ORDER — PERFLUTREN LIPID MICROSPHERE
1.0000 mL | INTRAVENOUS | Status: DC | PRN
  Administered 2018-05-18: 3 mL via INTRAVENOUS
  Filled 2018-05-18: qty 10

## 2018-05-18 MED ORDER — BISACODYL 5 MG PO TBEC
5.0000 mg | DELAYED_RELEASE_TABLET | Freq: Every day | ORAL | Status: DC | PRN
  Administered 2018-05-19 – 2018-05-22 (×2): 5 mg via ORAL
  Filled 2018-05-18 (×2): qty 1

## 2018-05-18 MED ORDER — CYCLOBENZAPRINE HCL 10 MG PO TABS
10.0000 mg | ORAL_TABLET | Freq: Three times a day (TID) | ORAL | Status: DC | PRN
  Administered 2018-05-21 – 2018-05-22 (×3): 10 mg via ORAL
  Filled 2018-05-18 (×3): qty 1

## 2018-05-18 MED ORDER — FLUOXETINE HCL 20 MG PO CAPS
20.0000 mg | ORAL_CAPSULE | Freq: Every evening | ORAL | Status: DC
  Administered 2018-05-18 – 2018-05-23 (×6): 20 mg via ORAL
  Filled 2018-05-18 (×6): qty 1

## 2018-05-18 MED ORDER — PANTOPRAZOLE SODIUM 40 MG PO TBEC
40.0000 mg | DELAYED_RELEASE_TABLET | Freq: Every day | ORAL | Status: DC
  Administered 2018-05-18 – 2018-05-23 (×6): 40 mg via ORAL
  Filled 2018-05-18 (×6): qty 1

## 2018-05-18 MED ORDER — INSULIN GLARGINE-LIXISENATIDE 100-33 UNT-MCG/ML ~~LOC~~ SOPN: 44.0000 [IU] | PEN_INJECTOR | Freq: Every evening | SUBCUTANEOUS | Status: DC

## 2018-05-18 MED ORDER — SACUBITRIL-VALSARTAN 49-51 MG PO TABS
1.0000 | ORAL_TABLET | Freq: Two times a day (BID) | ORAL | Status: DC
  Administered 2018-05-18 – 2018-05-20 (×4): 1 via ORAL
  Filled 2018-05-18 (×4): qty 1

## 2018-05-18 MED ORDER — FUROSEMIDE 10 MG/ML IJ SOLN
60.0000 mg | Freq: Two times a day (BID) | INTRAMUSCULAR | Status: DC
  Administered 2018-05-18 (×2): 60 mg via INTRAVENOUS
  Filled 2018-05-18 (×2): qty 8

## 2018-05-18 MED ORDER — TAMSULOSIN HCL 0.4 MG PO CAPS
0.8000 mg | ORAL_CAPSULE | Freq: Every evening | ORAL | Status: DC
  Administered 2018-05-18 – 2018-05-23 (×6): 0.8 mg via ORAL
  Filled 2018-05-18 (×6): qty 2

## 2018-05-18 MED ORDER — METOPROLOL SUCCINATE ER 100 MG PO TB24
100.0000 mg | ORAL_TABLET | Freq: Two times a day (BID) | ORAL | Status: DC
  Administered 2018-05-18 – 2018-05-23 (×10): 100 mg via ORAL
  Filled 2018-05-18 (×10): qty 1

## 2018-05-18 MED ORDER — LORATADINE 10 MG PO TABS
10.0000 mg | ORAL_TABLET | Freq: Every day | ORAL | Status: DC
  Administered 2018-05-19 – 2018-05-23 (×6): 10 mg via ORAL
  Filled 2018-05-18 (×6): qty 1

## 2018-05-18 MED ORDER — INSULIN ASPART 100 UNIT/ML ~~LOC~~ SOLN
0.0000 [IU] | Freq: Every day | SUBCUTANEOUS | Status: DC
  Administered 2018-05-19 – 2018-05-22 (×4): 3 [IU] via SUBCUTANEOUS

## 2018-05-18 MED ORDER — ACETAMINOPHEN 325 MG PO TABS
975.0000 mg | ORAL_TABLET | Freq: Every day | ORAL | Status: DC
  Administered 2018-05-18 – 2018-05-22 (×5): 975 mg via ORAL
  Filled 2018-05-18 (×5): qty 3

## 2018-05-18 MED ORDER — ISOSORBIDE MONONITRATE ER 30 MG PO TB24
30.0000 mg | ORAL_TABLET | Freq: Every day | ORAL | Status: DC
  Administered 2018-05-19 – 2018-05-23 (×5): 30 mg via ORAL
  Filled 2018-05-18 (×5): qty 1

## 2018-05-18 MED ORDER — FINASTERIDE 5 MG PO TABS
5.0000 mg | ORAL_TABLET | Freq: Every evening | ORAL | Status: DC
  Administered 2018-05-18 – 2018-05-23 (×6): 5 mg via ORAL
  Filled 2018-05-18 (×6): qty 1

## 2018-05-18 MED ORDER — DEXTROSE 50 % IV SOLN: 12.5000 g | INTRAVENOUS | Status: DC

## 2018-05-18 MED ORDER — FAMOTIDINE 20 MG PO TABS
10.0000 mg | ORAL_TABLET | Freq: Every day | ORAL | Status: DC
  Administered 2018-05-18 – 2018-05-22 (×5): 10 mg via ORAL
  Filled 2018-05-18 (×5): qty 1

## 2018-05-18 MED ORDER — ATORVASTATIN CALCIUM 40 MG PO TABS
40.0000 mg | ORAL_TABLET | Freq: Every day | ORAL | Status: DC
  Administered 2018-05-18 – 2018-05-23 (×6): 40 mg via ORAL
  Filled 2018-05-18 (×6): qty 1

## 2018-05-18 NOTE — Progress Notes (Signed)
  Echocardiogram 2D Echocardiogram has been performed.  Dorena Dew Kearra Calkin 05/18/2018, 12:29 PM

## 2018-05-18 NOTE — Progress Notes (Signed)
At [address] 5's RN Cicero Duck and given report about patient.

## 2018-05-18 NOTE — Progress Notes (Signed)
PROGRESS NOTE    Noah [name]  ZHY:[id number] DOB: [birth year] DOA: 05/17/2018 PCP: Estanislado Pandy, MD      Brief Narrative:  Noah [name] is a 78 y.o. M with CHF EF 30%, HTN, CKD III baseline Cr 1.3, DM, CAD s/p remote PCI, OSA on CPAP and PAF on warfarin who presents with progressive recurrent falls and now [facility].  Two separate presenting complaints: progressive weakness, fatigue, falling   Syncope last Monday leading to striking head, now with small SAH on CT head: patient got up Monday, got dressed, hadn't had breakfast or meds yet, was walking in driveway with dog and passed out without warning, striking his head.  Was groggy afterwards, but no tongue biting or witnessed seizures.  Returned to normal, did not seek care until day of admission, was finishing urinating in bathroom, got dizzy and nearly passed out.  At that time, came to ER where was found to have SAH, small.         Assessment & Plan:  Acute on chronic systolic CHF He appears dyspneic at rest, complains of much worse than usual swelling in legs.  Corvue not significantly off baseline, but lacks sensitivity.   -IV Lasix twice daily -Close monitoring renal function -Supplement K -Daily weights, strict I/Os -Continue Entresto, BB    Recurrent falls Progressive weakness I worry that this is sarcopenia, deconditioning in setting of weight gain. -PT eval  Syncope Pacer interrogated, no VT/VF, no shocks, no significant AMS during Monday moning or day of admission. -Obtain echo  Subarachnoid hemorrhage No surgical intervention needed.  Low risk of rebleeding if warfarin restarted in short term. -Appreciate Neurosurgery guidance -Continue periodic neurochecks  Diabetes -Continue gabapentin -Sliding scale corrections insulin  Hypertension Coronary disease secondary prevention -Continue aspirin, atorvastatin -Restart Imdur tomorrow -Continue carvedilol  CKD III Creatinine stable relative to  baseline  OSA -Continue CPAP   Atrial fibrillation, paroxysmal CHA2DS2-Vasc 6.   -Hold warfarin, restart in 5 days  Other medications -Continue Pepcid, pantoprazole -Continue finasteride, Flomax -Continue fluoxetine    MDM and disposition: The below labs and imaging reports were reviewed and summarized above.  Medication management as above.  The patient was admitted with Syncope, SAH, and weakness.  Now found to have CHF requiring IV Lasix and close monitoring of renal function.       DVT prophylaxis: SCDs Code Status: FULL Family Communication: Wife    Consultants:   Neurosurgery  Procedures:   Echo  Antimicrobials:   None    Subjective: Feeling no more syncope. Notes chronic orthopnea, worse leg swelling, dyspnea.  No confusion, vomiting, headahce, focal weakness, seizures.  Objective: Vitals:   05/18/18 0748 05/18/18 1153 05/18/18 1545 05/18/18 1949  BP: (!) 154/62 (!) 116/58 (!) 156/70 (!) 120/48  Pulse: 96 89 92 88  Resp: 20 18 19 18   Temp: 97.6 F (36.4 C) 97.8 F (36.6 C) (!) 97.2 F (36.2 C) 98 F (36.7 C)  TempSrc: Oral Oral Oral Oral  SpO2: 99% 94% 96% 98%  Weight:      Height:        Intake/Output Summary (Last 24 hours) at 05/18/2018 2033 Last data filed at 05/18/2018 1700 Gross per 24 hour  Intake 750 ml  Output 2125 ml  Net -1375 ml   Filed Weights   05/17/18 2008  Weight: 130.6 kg    Examination: General appearance: obese adult male, alert and in no acute distress.   HEENT: Anicteric, conjunctiva pink, lids and lashes normal. No  nasal deformity, discharge, epistaxis.  Lips moist.   Skin: Warm and dry.  no jaundice.  No suspicious rashes or lesions. Cardiac: RRR, nl S1-S2, no murmurs appreciated.  Capillary refill is brisk.  JVP not visible.  2+ pitting LE edema.  Radia  pulses 2+ and symmetric. Respiratory: Tachypneic, no wheezing, diminihsed throughout Abdomen: Abdomen soft.  no TTP. No ascites, distension,  hepatosplenomegaly.   MSK: No deformities or effusions. Neuro: Awake and alert.  EOMI, moves all extremities. Speech fluent.    Psych: Sensorium intact and responding to questions, attention normal. Affect normal.  Judgment and insight appear normal.    Data Reviewed: I have personally reviewed following labs and imaging studies:  CBC: Recent Labs  Lab 05/17/18 2228  WBC 9.4  HGB 10.6*  HCT 34.2*  MCV 86.8  PLT 148*   Basic Metabolic Panel: Recent Labs  Lab 05/17/18 2228  NA 136  K 4.0  CL 101  CO2 24  GLUCOSE 202*  BUN 30*  CREATININE 1.40*  CALCIUM 9.1   GFR: Estimated Creatinine Clearance: 58.3 mL/min (A) (by C-G formula based on SCr of 1.4 mg/dL (H)). Liver Function Tests: Recent Labs  Lab 05/17/18 2228  AST 16  ALT 18  ALKPHOS 48  BILITOT 0.7  PROT 5.8*  ALBUMIN 2.7*   No results for input(s): LIPASE, AMYLASE in the last 168 hours. No results for input(s): AMMONIA in the last 168 hours. Coagulation Profile: Recent Labs  Lab 05/16/18 1401 05/17/18 2228  INR 2.2 1.15   Cardiac Enzymes: No results for input(s): CKTOTAL, CKMB, CKMBINDEX, TROPONINI in the last 168 hours. BNP (last 3 results) No results for input(s): PROBNP in the last 8760 hours. HbA1C: No results for input(s): HGBA1C in the last 72 hours. CBG: Recent Labs  Lab 05/18/18 0008 05/18/18 0524 05/18/18 0608 05/18/18 1114 05/18/18 1635  GLUCAP 141* 62* 118* 159* 140*   Lipid Profile: No results for input(s): CHOL, HDL, LDLCALC, TRIG, CHOLHDL, LDLDIRECT in the last 72 hours. Thyroid Function Tests: No results for input(s): TSH, T4TOTAL, FREET4, T3FREE, THYROIDAB in the last 72 hours. Anemia Panel: No results for input(s): VITAMINB12, FOLATE, FERRITIN, TIBC, IRON, RETICCTPCT in the last 72 hours. Urine analysis: No results found for: COLORURINE, APPEARANCEUR, LABSPEC, PHURINE, GLUCOSEU, HGBUR, BILIRUBINUR, KETONESUR, PROTEINUR, UROBILINOGEN, NITRITE, LEUKOCYTESUR Sepsis  Labs: @LABRCNTIP (procalcitonin:4,lacticacidven:4)  )No results found for this or any previous visit (from the past 240 hour(s)).       Radiology Studies: Ct Head Wo Contrast  Result Date: 05/17/2018 CLINICAL DATA:  Head trauma.  Subarachnoid hemorrhage. EXAM: CT HEAD WITHOUT CONTRAST CT CERVICAL SPINE WITHOUT CONTRAST TECHNIQUE: Multidetector CT imaging of the head and cervical spine was performed following the standard protocol without intravenous contrast. Multiplanar CT image reconstructions of the cervical spine were also generated. COMPARISON:  05/17/2018 FINDINGS: CT HEAD FINDINGS Brain: Again noted is subarachnoid hemorrhage over the high right frontal lobe, stable. No new areas of hemorrhage. Diffuse cerebral atrophy and chronic small vessel disease. No mass effect or midline shift. Vascular: No hyperdense vessel or unexpected calcification. Skull: No acute calvarial abnormality. Sinuses/Orbits: Visualized paranasal sinuses and mastoids clear. Orbital soft tissues unremarkable. Other: None CT CERVICAL SPINE FINDINGS Alignment: No subluxation Skull base and vertebrae: No acute fracture. No primary bone lesion or focal pathologic process. Soft tissues and spinal canal: No prevertebral fluid or swelling. No visible canal hematoma. Disc levels: Diffuse degenerative disc disease with disc space narrowing and spurring, most pronounced at C5-6 and C6-7. Upper chest: No acute  findings Other: None IMPRESSION: Stable small subarachnoid hemorrhage over the high right frontal region. 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 No acute bony abnormality. Electronically Signed   By: Charlett NoseKevin  Dover M.D.   On: 05/17/2018 23:10   Ct Cervical Spine Wo Contrast  Result Date: 05/17/2018 CLINICAL DATA:  Head trauma.  Subarachnoid hemorrhage. EXAM: CT HEAD WITHOUT CONTRAST CT CERVICAL SPINE WITHOUT CONTRAST TECHNIQUE: Multidetector CT imaging of the head and cervical spine was performed following the  standard protocol without intravenous contrast. Multiplanar CT image reconstructions of the cervical spine were also generated. COMPARISON:  05/17/2018 FINDINGS: CT HEAD FINDINGS Brain: Again noted is subarachnoid hemorrhage over the high right frontal lobe, stable. No new areas of hemorrhage. Diffuse cerebral atrophy and chronic small vessel disease. No mass effect or midline shift. Vascular: No hyperdense vessel or unexpected calcification. Skull: No acute calvarial abnormality. Sinuses/Orbits: Visualized paranasal sinuses and mastoids clear. Orbital soft tissues unremarkable. Other: None CT CERVICAL SPINE FINDINGS Alignment: No subluxation Skull base and vertebrae: No acute fracture. No primary bone lesion or focal pathologic process. Soft tissues and spinal canal: No prevertebral fluid or swelling. No visible canal hematoma. Disc levels: Diffuse degenerative disc disease with disc space narrowing and spurring, most pronounced at C5-6 and C6-7. Upper chest: No acute findings Other: None IMPRESSION: Stable small subarachnoid hemorrhage over the high right frontal region. 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 No acute bony abnormality. Electronically Signed   By: Charlett NoseKevin  Dover M.D.   On: 05/17/2018 23:10   [place]as [place] Carotid  Result Date: 05/18/2018 Carotid Arterial Duplex Study Indications:       SAH. Risk Factors:      Hypertension, hyperlipidemia, Diabetes, past history of                    smoking, coronary artery disease. Other Factors:     CKD III, CHF, OSA, ICD,. Limitations:       Body habitus Comparison Study:  No prior study on file Performing Technologist: Sherren Kernsandace Kanady RVS  Examination Guidelines: A complete evaluation includes B-mode imaging, spectral Doppler, color Doppler, and power Doppler as needed of all accessible portions of each vessel. Bilateral testing is considered an integral part of a complete examination. Limited examinations for reoccurring indications may be  performed as noted.  Right Carotid Findings: +----------+--------+--------+--------+------------+------------------+           PSV cm/sEDV cm/sStenosisDescribe    Comments           +----------+--------+--------+--------+------------+------------------+ CCA Prox  79      21                          intimal thickening +----------+--------+--------+--------+------------+------------------+ CCA Distal73      16                          intimal thickening +----------+--------+--------+--------+------------+------------------+ ICA Prox  76      16              heterogenous                   +----------+--------+--------+--------+------------+------------------+ ICA Distal40      13                                             +----------+--------+--------+--------+------------+------------------+  ECA       123     16                                             +----------+--------+--------+--------+------------+------------------+ +----------+--------+-------+--------+-------------------+           PSV cm/sEDV cmsDescribeArm Pressure (mmHG) +----------+--------+-------+--------+-------------------+ [id number]                                         +----------+--------+-------+--------+-------------------+ +---------+--------+--+--------+--+ VertebralPSV cm/s42EDV cm/s12 +---------+--------+--+--------+--+  Left Carotid Findings: +----------+--------+--------+--------+------------+------------------+           PSV cm/sEDV cm/sStenosisDescribe    Comments           +----------+--------+--------+--------+------------+------------------+ CCA Prox  78      23                          intimal thickening +----------+--------+--------+--------+------------+------------------+ CCA Distal94      20                          intimal thickening +----------+--------+--------+--------+------------+------------------+ ICA Prox  76      18               heterogenous                   +----------+--------+--------+--------+------------+------------------+ ICA Distal76      19                                             +----------+--------+--------+--------+------------+------------------+ ECA       121     17                                             +----------+--------+--------+--------+------------+------------------+ +----------+--------+--------+--------+-------------------+ SubclavianPSV cm/sEDV cm/sDescribeArm Pressure (mmHG) +----------+--------+--------+--------+-------------------+           83                                          +----------+--------+--------+--------+-------------------+ +---------+--------+--+--------+--+ VertebralPSV cm/s77EDV cm/s19 +---------+--------+--+--------+--+  Summary: Right Carotid: The extracranial vessels were near-normal with only minimal wall                thickening or plaque. Left Carotid: The extracranial vessels were near-normal with only minimal wall               thickening or plaque. Vertebrals:  Bilateral vertebral arteries demonstrate antegrade flow. Subclavians: Normal flow hemodynamics were seen in bilateral subclavian              arteries. *See table(s) above for measurements and observations.     Preliminary         Scheduled Meds: . acetaminophen  975 mg Oral QHS  . aspirin EC  81 mg Oral Daily  . atorvastatin  40 mg Oral q1800  . dextrose  12.5 g Intravenous STAT  . famotidine  10 mg Oral  QHS  . finasteride  5 mg Oral QPM  . FLUoxetine  20 mg Oral QPM  . furosemide  60 mg Intravenous BID  . gabapentin  100 mg Oral BID  . insulin aspart  0-15 Units Subcutaneous TID WC  . insulin aspart  0-5 Units Subcutaneous QHS  . Insulin Glargine-Lixisenatide  44 Units Subcutaneous QPM  . [START ON 05/19/2018] isosorbide mononitrate  30 mg Oral Daily  . metoprolol  100 mg Oral BID  . pantoprazole  40 mg Oral Daily  . sacubitril-valsartan  1 tablet Oral BID   . tamsulosin  0.8 mg Oral QPM   Continuous Infusions:   LOS: 1 day    Time spent: 25 minutes    Alberteen Sam, MD [facility] 05/18/2018, 8:33 PM     Please page through AMION:  [email or web address] If 7PM-7AM, please contact night-coverage

## 2018-05-18 NOTE — Care Management Note (Signed)
Case Management Note  Patient Details  Name: Noah [name] MRN: [id number] Date of Birth: 09-Jun-1940  Subjective/Objective:   Pt admitted with subarachnoid hemorrhage. He is from home with his spouse. DME: cane, old walker No issues obtaining his meds.  Denies issues with transportation.                 Action/Plan: Awaiting PT/OT evals. CM following for d/c needs, physician orders.   Expected Discharge Date:  05/19/18               Expected Discharge Plan:     In-House Referral:     Discharge planning Services     Post Acute Care Choice:    Choice offered to:     DME Arranged:    DME Agency:     HH Arranged:    HH Agency:     Status of Service:  In process, will continue to follow  If discussed at Long Length of Stay Meetings, dates discussed:    Additional Comments:  Kermit Balo, RN 05/18/2018, 4:05 PM

## 2018-05-18 NOTE — Plan of Care (Signed)
  Problem: Education: Goal: Knowledge of patient specific risk factors addressed and post discharge goals established will improve Outcome: Progressing   

## 2018-05-18 NOTE — Plan of Care (Signed)
  Problem: Education: Goal: Knowledge of patient specific risk factors addressed and post discharge goals established will improve 05/18/2018 0450 by Barnabas Harries, RN Outcome: Progressing 05/18/2018 0448 by Barnabas Harries, RN Outcome: Progressing

## 2018-05-18 NOTE — Consult Note (Signed)
Reason for Consult:tSAH Referring Physician: EDP/hospitalist  Rakeem Howenstine Goedecke is an 78 y.o. male.   HPI:  58-year-old gentleman on anticoagulation for atrial fibrillation and pacemaker placement who has had multiple falls recently.  He fell on Monday and presented to emergency department on Wednesday.  A CT scan showed a small amount of traumatic subarachnoid hemorrhage.  His anticoagulation was then reversed and he was transferred here for further care.  He denies headache or numbness or tingling or weakness.  He states his falls have been frequent and he "usually do not know when I am going down."  Past Medical History:  Diagnosis Date  . CAD S/P percutaneous coronary angioplasty    remote PCIs in 1990's- last CFX PCI 2008  . Chronic kidney disease, stage III (moderate) (HCC)   . Diabetes mellitus with complication (HCC)    with hyperglycemia and diabetic autonomic poly neuropathy  . Gastro-esophageal reflux disease with esophagitis   . Hyperlipidemia   . Hypertension   . Ischemic cardiomyopathy 01/20/2018   St Jude ICD   . Morbid obesity (HCC)   . Obstructive sleep apnea    on CPAP  . Polyneuropathy   . Primary insomnia     Past Surgical History:  Procedure Laterality Date  . BIV ICD INSERTION CRT-D N/A 01/20/2018   Procedure: BIV ICD INSERTION CRT-D;  Surgeon: Marinus Maw, MD;  Location: [facility] INVASIVE CV LAB;  Service: Cardiovascular;  Laterality: N/A;  . BIV PACEMAKER INSERTION CRT-P  01/20/2018   St Jude- Dr Ladona Ridgel  . CARDIAC CATHETERIZATION     multiple angioplasty X 8, 4 stents     Allergies  Allergen Reactions  . Codeine Other (See Comments)    constipation  . Erythromycin-Sulfisoxazole Other (See Comments)    Causes infection in throat and eyes    Social History   Tobacco Use  . Smoking status: Former Smoker    Packs/day: 1.00    Years: 30.00    Pack years: 30.00    Last attempt to quit: 09/01/2000    Years since quitting: 17.7  . Smokeless tobacco: Never  Used  Substance Use Topics  . Alcohol use: Yes    Alcohol/week: 0.0 standard drinks    Family History  Problem Relation Age of Onset  . Heart attack Father   . Breast cancer Mother   . CAD Mother   . Cancer Brother      Review of Systems  Positive ROS: As above  All other systems have been reviewed and were otherwise negative with the exception of those mentioned in the HPI and as above.  Objective: Vital signs in last 24 hours: Temp:  [97.5 F (36.4 C)-97.9 F (36.6 C)] 97.6 F (36.4 C) (01/30 0748) Pulse Rate:  [84-97] 96 (01/30 0748) Resp:  [18-20] 20 (01/30 0748) BP: (126-154)/(62-84) 154/62 (01/30 0748) SpO2:  [91 %-99 %] 99 % (01/30 0748) Weight:  [130.6 kg] 130.6 kg (01/29 2008)  General Appearance: Alert, cooperative, no distress, appears stated age Head: Normocephalic, without obvious abnormality, atraumatic Eyes: PERRL, conjunctiva/corneas clear, EOM's intact   Lungs: respirations unlabored   NEUROLOGIC:   Mental status: A&O x4, no aphasia, good attention span, Memory and fund of knowledge peer to be appropriate Motor Exam - grossly normal, normal tone and bulk Sensory Exam - grossly normal Reflexes: Not tested Coordination - grossly normal Gait -not tested Balance -not tested Cranial Nerves: I: smell Not tested  II: visual acuity  OS: na    OD: na  II: visual fields Full to confrontation  II: pupils Equal, round, reactive to light  III,VII: ptosis None  III,IV,VI: extraocular muscles  Full ROM  V: mastication Normal  V: facial light touch sensation  Normal  V,VII: corneal reflex  Present  VII: facial muscle function - upper  Normal  VII: facial muscle function - lower Normal  VIII: hearing Not tested  IX: soft palate elevation  Normal  IX,X: gag reflex Present  XI: trapezius strength  5/5  XI: sternocleidomastoid strength 5/5  XI: neck flexion strength  5/5  XII: tongue strength  Normal    Data Review Lab Results  Component Value Date    WBC 9.4 05/17/2018   HGB 10.6 (L) 05/17/2018   HCT 34.2 (L) 05/17/2018   MCV 86.8 05/17/2018   PLT 148 (L) 05/17/2018   Lab Results  Component Value Date   NA 136 05/17/2018   K 4.0 05/17/2018   CL 101 05/17/2018   CO2 24 05/17/2018   BUN 30 (H) 05/17/2018   CREATININE 1.40 (H) 05/17/2018   GLUCOSE 202 (H) 05/17/2018   Lab Results  Component Value Date   INR 1.15 05/17/2018    Radiology: Ct Head Wo Contrast  Result Date: 05/17/2018 CLINICAL DATA:  Head trauma.  Subarachnoid hemorrhage. EXAM: CT HEAD WITHOUT CONTRAST CT CERVICAL SPINE WITHOUT CONTRAST TECHNIQUE: Multidetector CT imaging of the head and cervical spine was performed following the standard protocol without intravenous contrast. Multiplanar CT image reconstructions of the cervical spine were also generated. COMPARISON:  05/17/2018 FINDINGS: CT HEAD FINDINGS Brain: Again noted is subarachnoid hemorrhage over the high right frontal lobe, stable. No new areas of hemorrhage. Diffuse cerebral atrophy and chronic small vessel disease. No mass effect or midline shift. Vascular: No hyperdense vessel or unexpected calcification. Skull: No acute calvarial abnormality. Sinuses/Orbits: Visualized paranasal sinuses and mastoids clear. Orbital soft tissues unremarkable. Other: None CT CERVICAL SPINE FINDINGS Alignment: No subluxation Skull base and vertebrae: No acute fracture. No primary bone lesion or focal pathologic process. Soft tissues and spinal canal: No prevertebral fluid or swelling. No visible canal hematoma. Disc levels: Diffuse degenerative disc disease with disc space narrowing and spurring, most pronounced at C5-6 and C6-7. Upper chest: No acute findings Other: None IMPRESSION: Stable small subarachnoid hemorrhage over the high right frontal region. 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 No acute bony abnormality. Electronically Signed   By: Charlett Nose M.D.   On: 05/17/2018 23:10   Ct Cervical Spine Wo  Contrast  Result Date: 05/17/2018 CLINICAL DATA:  Head trauma.  Subarachnoid hemorrhage. EXAM: CT HEAD WITHOUT CONTRAST CT CERVICAL SPINE WITHOUT CONTRAST TECHNIQUE: Multidetector CT imaging of the head and cervical spine was performed following the standard protocol without intravenous contrast. Multiplanar CT image reconstructions of the cervical spine were also generated. COMPARISON:  05/17/2018 FINDINGS: CT HEAD FINDINGS Brain: Again noted is subarachnoid hemorrhage over the high right frontal lobe, stable. No new areas of hemorrhage. Diffuse cerebral atrophy and chronic small vessel disease. No mass effect or midline shift. Vascular: No hyperdense vessel or unexpected calcification. Skull: No acute calvarial abnormality. Sinuses/Orbits: Visualized paranasal sinuses and mastoids clear. Orbital soft tissues unremarkable. Other: None CT CERVICAL SPINE FINDINGS Alignment: No subluxation Skull base and vertebrae: No acute fracture. No primary bone lesion or focal pathologic process. Soft tissues and spinal canal: No prevertebral fluid or swelling. No visible canal hematoma. Disc levels: Diffuse degenerative disc disease with disc space narrowing and spurring, most pronounced at C5-6 and C6-7. Upper  chest: No acute findings Other: None IMPRESSION: Stable small subarachnoid hemorrhage over the high right frontal region. 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 No acute bony abnormality. Electronically Signed   By: Charlett NoseKevin  Dover M.D.   On: 05/17/2018 23:10   [place] Carotid  Result Date: 05/18/2018 Carotid Arterial Duplex Study Indications:       SAH. Risk Factors:      Hypertension, hyperlipidemia, Diabetes, past history of                    smoking, coronary artery disease. Other Factors:     CKD III, CHF, OSA, ICD,. Limitations:       Body habitus Comparison Study:  No prior study on file Performing Technologist: Sherren Kernsandace Kanady RVS  Examination Guidelines: A complete evaluation includes B-mode  imaging, spectral Doppler, color Doppler, and power Doppler as needed of all accessible portions of each vessel. Bilateral testing is considered an integral part of a complete examination. Limited examinations for reoccurring indications may be performed as noted.  Right Carotid Findings: +----------+--------+--------+--------+------------+------------------+           PSV cm/sEDV cm/sStenosisDescribe    Comments           +----------+--------+--------+--------+------------+------------------+ CCA Prox  79      21                          intimal thickening +----------+--------+--------+--------+------------+------------------+ CCA Distal73      16                          intimal thickening +----------+--------+--------+--------+------------+------------------+ ICA Prox  76      16              heterogenous                   +----------+--------+--------+--------+------------+------------------+ ICA Distal40      13                                             +----------+--------+--------+--------+------------+------------------+ ECA       123     16                                             +----------+--------+--------+--------+------------+------------------+ +----------+--------+-------+--------+-------------------+           PSV cm/sEDV cmsDescribeArm Pressure (mmHG) +----------+--------+-------+--------+-------------------+ [id number]                                         +----------+--------+-------+--------+-------------------+ +---------+--------+--+--------+--+ VertebralPSV cm/s42EDV cm/s12 +---------+--------+--+--------+--+  Left Carotid Findings: +----------+--------+--------+--------+------------+------------------+           PSV cm/sEDV cm/sStenosisDescribe    Comments           +----------+--------+--------+--------+------------+------------------+ CCA Prox  78      23                          intimal thickening  +----------+--------+--------+--------+------------+------------------+ CCA Distal94      20  intimal thickening +----------+--------+--------+--------+------------+------------------+ ICA Prox  76      18              heterogenous                   +----------+--------+--------+--------+------------+------------------+ ICA Distal76      19                                             +----------+--------+--------+--------+------------+------------------+ ECA       121     17                                             +----------+--------+--------+--------+------------+------------------+ +----------+--------+--------+--------+-------------------+ SubclavianPSV cm/sEDV cm/sDescribeArm Pressure (mmHG) +----------+--------+--------+--------+-------------------+           83                                          +----------+--------+--------+--------+-------------------+ +---------+--------+--+--------+--+ VertebralPSV cm/s77EDV cm/s19 +---------+--------+--+--------+--+  Summary: Right Carotid: The extracranial vessels were near-normal with only minimal wall                thickening or plaque. Left Carotid: The extracranial vessels were near-normal with only minimal wall               thickening or plaque. Vertebrals:  Bilateral vertebral arteries demonstrate antegrade flow. Subclavians: Normal flow hemodynamics were seen in bilateral subclavian              arteries. *See table(s) above for measurements and observations.     Preliminary      Assessment/Plan: Estimated body mass index is 43.78 kg/m as calculated from the following:   Height as of this encounter: 5\' 8"  (1.727 m).   Weight as of this encounter: 130.87 kg.    78 year old gentleman with atrial fibrillation on anticoagulation who fell on Monday presented to the emergency department on Wednesday.  CT scan showed a small amount of traumatic subarachnoid hemorrhage in the  high right parietal region.  At the time of the scan he had been on full anticoagulation for 48 hours after the fall.  The argument could be made that reversal of his anticoagulation actually carry more risk at that point than simple continuation of the anticoagulation or even continuation of the anticoagulation since he had past 48 hours with no worsening of the hemorrhage presumably.  Now that he is off of anticoagulation and the risk of stroke with atrial fibrillation is low, and I would recommend remaining off anticoagulation for 5 to 7 days.  I think the more pressing issue is determining by he continues to fall regularly.  This is dangerous in a 78 year old on anticoagulation.  Agree with cardiac work-up.  Spoke with him at length about postconcussive symptoms and postconcussive syndrome and avoidance of second impact.  Tia Alert 05/18/2018 9:53 AM

## 2018-05-18 NOTE — Progress Notes (Signed)
Spoke with Judie Grieve at [facility] and notified patient needs a pacemaker interrogation. Will be completed today.

## 2018-05-18 NOTE — Progress Notes (Signed)
VASCULAR LAB PRELIMINARY  PRELIMINARY  PRELIMINARY  PRELIMINARY  Carotid duplex completed.    Preliminary report:  See CV Proc  Noah [name], RVT 05/18/2018, 9:21 AM

## 2018-05-18 NOTE — Progress Notes (Signed)
Patient arrived in the unit at 1945pm, alert and oriented x4, denied of pain, resting in a bed at this moment, activated tele monitor box 15, bed is locked, on lowest position, bed alarm is on, call bell and personal belongings are within reach, notified admission placement staff, MD is awared, and will continued to monitor closely.

## 2018-05-19 DIAGNOSIS — I5023 Acute on chronic systolic (congestive) heart failure: Secondary | ICD-10-CM

## 2018-05-19 DIAGNOSIS — R55 Syncope and collapse: Secondary | ICD-10-CM

## 2018-05-19 DIAGNOSIS — G4733 Obstructive sleep apnea (adult) (pediatric): Secondary | ICD-10-CM

## 2018-05-19 DIAGNOSIS — N183 Chronic kidney disease, stage 3 (moderate): Secondary | ICD-10-CM

## 2018-05-19 LAB — CBC
HCT: 35.9 % — ABNORMAL LOW (ref 39.0–52.0)
Hemoglobin: 11.4 g/dL — ABNORMAL LOW (ref 13.0–17.0)
MCH: 27.9 pg (ref 26.0–34.0)
MCHC: 31.8 g/dL (ref 30.0–36.0)
MCV: 87.8 fL (ref 80.0–100.0)
NRBC: 0 % (ref 0.0–0.2)
Platelets: 165 10*3/uL (ref 150–400)
RBC: 4.09 MIL/uL — ABNORMAL LOW (ref 4.22–5.81)
RDW: 12.9 % (ref 11.5–15.5)
WBC: 9.5 10*3/uL (ref 4.0–10.5)

## 2018-05-19 LAB — BASIC METABOLIC PANEL
Anion gap: 10 (ref 5–15)
BUN: 26 mg/dL — ABNORMAL HIGH (ref 8–23)
CO2: 28 mmol/L (ref 22–32)
CREATININE: 1.38 mg/dL — AB (ref 0.61–1.24)
Calcium: 8.9 mg/dL (ref 8.9–10.3)
Chloride: 101 mmol/L (ref 98–111)
GFR calc non Af Amer: 49 mL/min — ABNORMAL LOW (ref >60)
GFR, EST AFRICAN AMERICAN: 57 mL/min — AB (ref >60)
Glucose, Bld: 154 mg/dL — ABNORMAL HIGH (ref 70–99)
Potassium: 3.6 mmol/L (ref 3.5–5.1)
SODIUM: 139 mmol/L (ref 135–145)

## 2018-05-19 LAB — GLUCOSE, CAPILLARY
Glucose-Capillary: 139 mg/dL — ABNORMAL HIGH (ref 70–99)
Glucose-Capillary: 222 mg/dL — ABNORMAL HIGH (ref 70–99)
Glucose-Capillary: 221 mg/dL — ABNORMAL HIGH (ref 70–99)
Glucose-Capillary: 303 mg/dL — ABNORMAL HIGH (ref 70–99)

## 2018-05-19 LAB — HEMOGLOBIN A1C
Hgb A1c MFr Bld: 9.5 % — ABNORMAL HIGH (ref 4.8–5.6)
Mean Plasma Glucose: 225.95 mg/dL

## 2018-05-19 MED ORDER — FUROSEMIDE 40 MG PO TABS: 40.0000 mg | ORAL_TABLET | Freq: Two times a day (BID) | ORAL | Status: DC

## 2018-05-19 MED ORDER — FUROSEMIDE 80 MG PO TABS
80.0000 mg | ORAL_TABLET | Freq: Every day | ORAL | Status: DC
  Administered 2018-05-19 – 2018-05-20 (×2): 80 mg via ORAL
  Filled 2018-05-19 (×2): qty 1

## 2018-05-19 MED ORDER — INSULIN GLARGINE 100 UNIT/ML ~~LOC~~ SOLN
15.0000 [IU] | Freq: Every day | SUBCUTANEOUS | Status: DC
  Administered 2018-05-19 – 2018-05-21 (×3): 15 [IU] via SUBCUTANEOUS
  Filled 2018-05-19 (×4): qty 0.15

## 2018-05-19 MED ORDER — FUROSEMIDE 40 MG PO TABS
40.0000 mg | ORAL_TABLET | Freq: Every evening | ORAL | Status: DC
  Administered 2018-05-19: 40 mg via ORAL
  Filled 2018-05-19: qty 1

## 2018-05-19 NOTE — Evaluation (Signed)
Physical Therapy Evaluation Patient Details Name: Noah [name] MRN: [id number] DOB: 20-Jul-1940 Today's Date: 05/19/2018   History of Present Illness  78 y.o. M with CHF EF 30%, HTN, CKD III baseline Cr 1.3, DM, CAD s/p remote PCI, OSA on CPAP and PAF on warfarin who presents with progressive recurrent falls and now [facility].  Clinical Impression  Pt admitted with above diagnosis. Pt currently with functional limitations due to the deficits listed below (see PT Problem List). Pt was able to ambulate with RW in hallway with good stability but cannot accept challenges to balance. Pt states he doesn't think he can use RW in all rooms in house unless they declutter.  Pt would benefit from SNF to gain strength and work on balance so that he can progress back to cane on d/c.  Will follow acutely.   Pt will benefit from skilled PT to increase their independence and safety with mobility to allow discharge to the venue listed below.      Follow Up Recommendations SNF;Supervision/Assistance - 24 hour    Equipment Recommendations  Rolling walker with 5" wheels;3in1 (PT)    Recommendations for Other Services       Precautions / Restrictions Precautions Precautions: Fall Precaution Comments: h/o falls Restrictions Weight Bearing Restrictions: No      Mobility  Bed Mobility Overal bed mobility: Needs Assistance Bed Mobility: Supine to Sit     Supine to sit: Min guard;HOB elevated     General bed mobility comments: min guard to lightly steady trunk during power-up. HOB elevated. Extra time and effort. Pt reports he sleeps in standard recliner at home  Transfers Overall transfer level: Needs assistance Equipment used: Rolling walker (2 wheeled) Transfers: Sit to/from Stand Sit to Stand: Min guard;From elevated surface         General transfer comment: from EOB, to recliner. cues for technique with rw. min guard for safety to lightly steady.  Pt cannot stand with out at least 1 UE support.     Ambulation/Gait Ambulation/Gait assistance: Min guard;Min assist;+2 safety/equipment Gait Distance (Feet): 125 Feet Assistive device: Rolling walker (2 wheeled) Gait Pattern/deviations: Step-through pattern;Decreased stride length;Drifts right/left;Trunk flexed;Leaning posteriorly;Decreased step length - right;Decreased step length - left   Gait velocity interpretation: <1.31 ft/sec, indicative of household ambulator General Gait Details: Pt was able to ambulate with RW with good stability overall.  Cannot accept challenges to balance and definitely relies on RW.   Stairs            Wheelchair Mobility    Modified Rankin (Stroke Patients Only)       Balance Overall balance assessment: Needs assistance;History of Falls Sitting-balance support: No upper extremity supported;Feet supported Sitting balance-Leahy Scale: Fair     Standing balance support: Single extremity supported;Bilateral upper extremity supported;During functional activity Standing balance-Leahy Scale: Poor Standing balance comment: external support for balance as well as bil UE support needed.                             Pertinent Vitals/Pain Pain Assessment: Faces Faces Pain Scale: Hurts little more Pain Location: back and LEs Pain Descriptors / Indicators: Aching;Grimacing;Guarding Pain Intervention(s): Limited activity within patient's tolerance;Monitored during session;Repositioned    Home Living Family/patient expects to be discharged to:: Private residence Living Arrangements: Spouse/significant other Available Help at Discharge: Family;Available 24 hours/day Type of Home: House Home Access: Stairs to enter Entrance Stairs-Rails: Left Entrance Stairs-Number of Steps: 2 Home Layout: One  level Home Equipment: Cane - single point Additional Comments: pt reports narrow spaces in bathroom/shower areas.    Prior Function Level of Independence: Independent with assistive  device(s);Needs assistance   Gait / Transfers Assistance Needed: cane with Modif I but has fallen  ADL's / Homemaking Assistance Needed: Bathing and dressing self, was getting in tuband standing to shower  Comments: Used cane at all times, couple times a month sometimes walking dog and then in bathroom has gotten dizzy getting on and off toilet.  Pt still drives.  Pt sleeps in recliner      Hand Dominance   Dominant Hand: Right    Extremity/Trunk Assessment   Upper Extremity Assessment Upper Extremity Assessment: Defer to OT evaluation    Lower Extremity Assessment Lower Extremity Assessment: Generalized weakness    Cervical / Trunk Assessment Cervical / Trunk Assessment: Kyphotic(mass on R side of back, mid area. pt reports hernia )  Communication   Communication: HOH  Cognition Arousal/Alertness: Awake/alert Behavior During Therapy: WFL for tasks assessed/performed Overall Cognitive Status: Within Functional Limits for tasks assessed                                 General Comments: some slow processing      General Comments      Exercises     Assessment/Plan    PT Assessment Patient needs continued PT services  PT Problem List Decreased activity tolerance;Decreased balance;Decreased mobility;Decreased knowledge of use of DME;Decreased safety awareness;Decreased knowledge of precautions;Pain       PT Treatment Interventions DME instruction;Gait training;Functional mobility training;Therapeutic activities;Therapeutic exercise;Balance training;Patient/family education    PT Goals (Current goals can be found in the Care Plan section)  Acute Rehab PT Goals Patient Stated Goal: stop falling PT Goal Formulation: With patient Time For Goal Achievement: 06/02/18 Potential to Achieve Goals: Good    Frequency Min 3X/week   Barriers to discharge        Co-evaluation PT/OT/SLP Co-Evaluation/Treatment: Yes Reason for Co-Treatment: For  patient/therapist safety PT goals addressed during session: Mobility/safety with mobility OT goals addressed during session: ADL's and self-care;Proper use of Adaptive equipment and DME       AM-PAC PT "6 Clicks" Mobility  Outcome Measure Help needed turning from your back to your side while in a flat bed without using bedrails?: A Little Help needed moving from lying on your back to sitting on the side of a flat bed without using bedrails?: A Little Help needed moving to and from a bed to a chair (including a wheelchair)?: A Little Help needed standing up from a chair using your arms (e.g., wheelchair or bedside chair)?: A Little Help needed to walk in hospital room?: A Little Help needed climbing 3-5 steps with a railing? : A Lot 6 Click Score: 17    End of Session Equipment Utilized During Treatment: Gait belt Activity Tolerance: Patient limited by fatigue Patient left: in chair;with call bell/phone within reach;with chair alarm set Nurse Communication: Mobility status PT Visit Diagnosis: Muscle weakness (generalized) (M62.81);History of falling (Z91.81);Pain Pain - part of body: (back and LES)    Time: 1110-1143 PT Time Calculation (min) (ACUTE ONLY): 33 min   Charges:   PT Evaluation $PT Eval Moderate Complexity: 1 Mod PT Treatments $Gait Training: 8-22 mins        Venisha Boehning,PT Acute Rehabilitation Services Pager:  [phone]  Office:  [phone]    Berline Lopes 05/19/2018,  12:39 PM

## 2018-05-19 NOTE — Progress Notes (Signed)
PROGRESS NOTE    Noah [name]  TFT:[id number] DOB: [birth year] DOA: 05/17/2018 PCP: Estanislado Pandy, MD      Brief Narrative:  [name] is a 78 y.o. M with CHF EF 30%, HTN, CKD III baseline Cr 1.3, DM, CAD s/p remote PCI, OSA on CPAP and PAF on warfarin who presents with progressive recurrent falls and now [name].  Two separate presenting complaints: progressive weakness, fatigue, falling   Syncope last Monday leading to striking head, now with small SAH on CT head: patient got up Monday, got dressed, hadn't had breakfast or meds yet, was walking in driveway with dog and passed out without warning, striking his head.  Was groggy afterwards, but no tongue biting or witnessed seizures.  Returned to normal, did not seek care until day of admission, was finishing urinating in bathroom, got dizzy and nearly passed out.  At that time, came to ER where was found to have SAH, small.         Assessment & Plan:  Acute on chronic systolic CHF On arrival to [facility], patient had dyspnea, orthopnea and worse than usual leg swelling.  Overnight 1.7L net negative, swelling resolved, Cr stable.  Echo obtained, showed EF 40%, improved from previous.  No significant valvular disease.  -Resume home Lasix -Close monitoring renal function -Supplement K -Daily weights, strict I/Os -Continue Entresto, BB    Recurrent falls Progressive weakness The patient has aggressive weakness and recurrent falls that are independent of his syncope.  They do appear to correlate with about a 10 to 15 pound weight gain (adipose not water weight) over the last several months as documented by his 2 cardiologist and his family.  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   -PT eval  Syncope Pacer interrogated, no VT/VF, no shocks, no significant AMS during Monday morning) syncope  while walking the dog) or day of admission (post micturition near-syncope).  Echocardiogram shows improved ejection fraction since September, no new regional wall motion abnormalities or valvular disease.  He is not particularly orthostatic.  Doubt seizure.  Etiology somewhat unclear.  Subarachnoid hemorrhage No surgical intervention needed.  Low risk of rebleeding if warfarin restarted in short term. -Appreciate Neurosurgery guidance -Continue periodic neurochecks -Restart warfarin in 4 days  Diabetes -Continue gabapentin -Sliding scale corrections insulin -Replace Soliqua with Lantus in hospital  Hypertension Coronary disease secondary prevention -Continue aspirin, atorvastatin -Continue Imdur tomorrow -Continue carvedilol  CKD III Creatinine stable today after diuresis  OSA -Continue CPAP   Atrial fibrillation, paroxysmal CHA2DS2-Vasc 6.   -Hold warfarin, restart in 4 days  Other medications -Continue Pepcid, pantoprazole -Continue finasteride, Flomax -Continue fluoxetine    MDM and disposition: The below labs and imaging reports were reviewed and summarized above.  Medication management as above.  Presented with syncope, subarachnoid hemorrhage, and progressive weakness.  He was treated for a flare of acute congestive heart failure, and his renal function is stable.  We will resume his home medicines at this time, as we make arrangements for safe discharge plan.         DVT prophylaxis: SCDs Code Status: FULL Family Communication: None present    Consultants:   Neurosurgery  Procedures:   Echo  Antimicrobials:   None    Subjective: No new syncope, no seizures.  Swelling is resolved.  Breathing feels normal.  No chest pain,  confusion, fever.  Objective: Vitals:   05/18/18 2225 05/19/18 0317 05/19/18 0621 05/19/18 1205  BP: (!) 143/80  (!) 158/93 129/71  Pulse: 87  84 77  Resp: 20  16 16   Temp: 98.2 F (36.8 C)  97.6 F (36.4 C) (!) 97.4 F  (36.3 C)  TempSrc: Oral  Oral Oral  SpO2: 100%  98% 98%  Weight: 128.4 kg 127.7 kg    Height: 5\' 8"  (1.727 m)       Intake/Output Summary (Last 24 hours) at 05/19/2018 1519 Last data filed at 05/19/2018 [id number] Gross per 24 hour  Intake 840 ml  Output 2200 ml  Net -1360 ml   Filed Weights   05/17/18 2008 05/18/18 2225 05/19/18 0317  Weight: 130.6 kg 128.4 kg 127.7 kg    Examination: General appearance: [name], lying in bed, no acute distress HEENT: Anicteric, conjunctival pink, lids and lashes normal.  No nasal deformity, discharge, or epistaxis.  Dentition normal, lips normal, oropharynx moist, no oral lesions, hearing normal. Skin: Warm and dry.  no jaundice.  No suspicious rashes or lesions. Cardiac: Regular rate and rhythm, no murmurs appreciated, no lower extremity edema, JVP not visible due to body habitus. Respiratory: Respiratory effort normal, no wheezing, diminished throughout.  No rales. Abdomen: Abdomen soft, no tenderness palpation, ascites, distention, hepatosplenomegaly. MSK: No deformities or effusions. Neuro: Awake and alert, extraocular movements intact, moves all extremities, speech fluent.    Psych: Sensorium intact and responding to questions, attention normal.  Affect normal.  Judgment and insight appear normal.    Data Reviewed: I have personally reviewed following labs and imaging studies:  CBC: Recent Labs  Lab 05/17/18 2228 05/19/18 0607  WBC 9.4 9.5  HGB 10.6* 11.4*  HCT 34.2* 35.9*  MCV 86.8 87.8  PLT 148* 165   Basic Metabolic Panel: Recent Labs  Lab 05/17/18 2228 05/19/18 0607  NA 136 139  K 4.0 3.6  CL 101 101  CO2 24 28  GLUCOSE 202* 154*  BUN 30* 26*  CREATININE 1.40* 1.38*  CALCIUM 9.1 8.9   GFR: Estimated Creatinine Clearance: 58.4 mL/min (A) (by C-G formula based on SCr of 1.38 mg/dL (H)). Liver Function Tests: Recent Labs  Lab 05/17/18 2228  AST 16  ALT 18  ALKPHOS 48  BILITOT 0.7  PROT 5.8*  ALBUMIN 2.7*     No results for input(s): LIPASE, AMYLASE in the last 168 hours. No results for input(s): AMMONIA in the last 168 hours. Coagulation Profile: Recent Labs  Lab 05/16/18 1401 05/17/18 2228  INR 2.2 1.15   Cardiac Enzymes: No results for input(s): CKTOTAL, CKMB, CKMBINDEX, TROPONINI in the last 168 hours. BNP (last 3 results) No results for input(s): PROBNP in the last 8760 hours. HbA1C: Recent Labs    05/19/18 0607  HGBA1C 9.5*   CBG: Recent Labs  Lab 05/18/18 1114 05/18/18 1635 05/18/18 2113 05/19/18 0738 05/19/18 1143  GLUCAP 159* 140* 132* 139* 222*   Lipid Profile: No results for input(s): CHOL, HDL, LDLCALC, TRIG, CHOLHDL, LDLDIRECT in the last 72 hours. Thyroid Function Tests: No results for input(s): TSH, T4TOTAL, FREET4, T3FREE, THYROIDAB in the last 72 hours. Anemia Panel: No results for input(s): VITAMINB12, FOLATE, FERRITIN, TIBC, IRON, RETICCTPCT in the last 72 hours. Urine analysis: No results found for: COLORURINE, APPEARANCEUR, LABSPEC, PHURINE, GLUCOSEU, HGBUR, BILIRUBINUR, KETONESUR, PROTEINUR, UROBILINOGEN, NITRITE, LEUKOCYTESUR Sepsis Labs: @LABRCNTIP (procalcitonin:4,lacticacidven:4)  )No results found for this or any previous visit (from the past 240 hour(s)).  Radiology Studies: Ct Head Wo Contrast  Result Date: 05/17/2018 CLINICAL DATA:  Head trauma.  Subarachnoid hemorrhage. EXAM: CT HEAD WITHOUT CONTRAST CT CERVICAL SPINE WITHOUT CONTRAST TECHNIQUE: Multidetector CT imaging of the head and cervical spine was performed following the standard protocol without intravenous contrast. Multiplanar CT image reconstructions of the cervical spine were also generated. COMPARISON:  05/17/2018 FINDINGS: CT HEAD FINDINGS Brain: Again noted is subarachnoid hemorrhage over the high right frontal lobe, stable. No new areas of hemorrhage. Diffuse cerebral atrophy and chronic small vessel disease. No mass effect or midline shift. Vascular: No hyperdense  vessel or unexpected calcification. Skull: No acute calvarial abnormality. Sinuses/Orbits: Visualized paranasal sinuses and mastoids clear. Orbital soft tissues unremarkable. Other: None CT CERVICAL SPINE FINDINGS Alignment: No subluxation Skull base and vertebrae: No acute fracture. No primary bone lesion or focal pathologic process. Soft tissues and spinal canal: No prevertebral fluid or swelling. No visible canal hematoma. Disc levels: Diffuse degenerative disc disease with disc space narrowing and spurring, most pronounced at C5-6 and C6-7. Upper chest: No acute findings Other: None IMPRESSION: Stable small subarachnoid hemorrhage over the high right frontal region. 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 No acute bony abnormality. Electronically Signed   By: Charlett Nose M.D.   On: 05/17/2018 23:10   Ct Cervical Spine Wo Contrast  Result Date: 05/17/2018 CLINICAL DATA:  Head trauma.  Subarachnoid hemorrhage. EXAM: CT HEAD WITHOUT CONTRAST CT CERVICAL SPINE WITHOUT CONTRAST TECHNIQUE: Multidetector CT imaging of the head and cervical spine was performed following the standard protocol without intravenous contrast. Multiplanar CT image reconstructions of the cervical spine were also generated. COMPARISON:  05/17/2018 FINDINGS: CT HEAD FINDINGS Brain: Again noted is subarachnoid hemorrhage over the high right frontal lobe, stable. No new areas of hemorrhage. Diffuse cerebral atrophy and chronic small vessel disease. No mass effect or midline shift. Vascular: No hyperdense vessel or unexpected calcification. Skull: No acute calvarial abnormality. Sinuses/Orbits: Visualized paranasal sinuses and mastoids clear. Orbital soft tissues unremarkable. Other: None CT CERVICAL SPINE FINDINGS Alignment: No subluxation Skull base and vertebrae: No acute fracture. No primary bone lesion or focal pathologic process. Soft tissues and spinal canal: No prevertebral fluid or swelling. No visible canal hematoma.  Disc levels: Diffuse degenerative disc disease with disc space narrowing and spurring, most pronounced at C5-6 and C6-7. Upper chest: No acute findings Other: None IMPRESSION: Stable small subarachnoid hemorrhage over the high right frontal region. 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 No acute bony abnormality. Electronically Signed   By: Charlett Nose M.D.   On: 05/17/2018 23:10   Vas US Carotid  Result Date: 05/19/2018 Carotid Arterial Duplex Study Performing Technologist: Sherren Kerns RVS  Examination Guidelines: A complete evaluation includes B-mode imaging, spectral Doppler, color Doppler, and power Doppler as needed of all accessible portions of each vessel. Bilateral testing is considered an integral part of a complete examination. Limited examinations for reoccurring indications may be performed as noted.  Right Carotid Findings: +----------+--------+--------+--------+--------+--------+           PSV cm/sEDV cm/sStenosisDescribeComments +----------+--------+--------+--------+--------+--------+ CCA Prox  79      21                               +----------+--------+--------+--------+--------+--------+ CCA Distal73      16                               +----------+--------+--------+--------+--------+--------+  ICA Prox  76      16                               +----------+--------+--------+--------+--------+--------+ ICA Distal40      13                               +----------+--------+--------+--------+--------+--------+ ECA       123     16                               +----------+--------+--------+--------+--------+--------+ +----------+--------+-------+--------+-------------------+           PSV cm/sEDV cmsDescribeArm Pressure (mmHG) +----------+--------+-------+--------+-------------------+ [id number]                                         +----------+--------+-------+--------+-------------------+  +---------+--------+--+--------+--+ VertebralPSV cm/s42EDV cm/s12 +---------+--------+--+--------+--+  Left Carotid Findings: +----------+--------+--------+--------+--------+--------+           PSV cm/sEDV cm/sStenosisDescribeComments +----------+--------+--------+--------+--------+--------+ CCA Prox  78      23                               +----------+--------+--------+--------+--------+--------+ CCA Distal94      20                               +----------+--------+--------+--------+--------+--------+ ICA Prox  76      18                               +----------+--------+--------+--------+--------+--------+ ICA Distal76      19                               +----------+--------+--------+--------+--------+--------+ ECA       121     17                               +----------+--------+--------+--------+--------+--------+ +----------+--------+--------+--------+-------------------+ SubclavianPSV cm/sEDV cm/sDescribeArm Pressure (mmHG) +----------+--------+--------+--------+-------------------+           83                                          +----------+--------+--------+--------+-------------------+ +---------+--------+--+--------+--+ VertebralPSV cm/s77EDV cm/s19 +---------+--------+--+--------+--+  Summary:   *See table(s) above for measurements and observations.  Electronically signed by Lemar Livings MD on 05/19/2018 at 12:37:23 AM.   Final         Scheduled Meds: . acetaminophen  975 mg Oral QHS  . aspirin EC  81 mg Oral Daily  . atorvastatin  40 mg Oral q1800  . famotidine  10 mg Oral QHS  . finasteride  5 mg Oral QPM  . FLUoxetine  20 mg Oral QPM  . furosemide  80 mg Oral Daily   And  . furosemide  40 mg Oral QPM  . gabapentin  100 mg Oral BID  . insulin aspart  0-15 Units Subcutaneous TID WC  .  insulin aspart  0-5 Units Subcutaneous QHS  . Insulin Glargine-Lixisenatide  44 Units Subcutaneous QPM  . isosorbide  mononitrate  30 mg Oral Daily  . loratadine  10 mg Oral Daily  . metoprolol  100 mg Oral BID  . pantoprazole  40 mg Oral Daily  . sacubitril-valsartan  1 tablet Oral BID  . tamsulosin  0.8 mg Oral QPM   Continuous Infusions:   LOS: 2 days    Time spent: 25 minutes    Alberteen Samhristopher P Thomasena Vandenheuvel, MD [facility] 05/19/2018, 3:19 PM     Please page through AMION:  [email or web address] If 7PM-7AM, please contact night-coverage

## 2018-05-19 NOTE — Care Management Important Message (Signed)
Important Message  Patient Details  Name: Noah [name] MRN: [id number] Date of Birth: [birth year]   Medicare Important Message Given:  Yes    Lile Mccurley P Lakeria Starkman 05/19/2018, 2:53 PM

## 2018-05-19 NOTE — Evaluation (Signed)
Occupational Therapy Evaluation Patient Details Name: Noah [name] MRN: [id number] DOB: [birth year] Today's Date: 05/19/2018    History of Present Illness 78 y.o. M with CHF EF 30%, HTN, CKD III baseline Cr 1.3, DM, CAD s/p remote PCI, OSA on CPAP and PAF on warfarin who presents with progressive recurrent falls and now [facility].   Clinical Impression   Pt admitted with the above diagnoses and presents with below problem list. Pt will benefit from continued acute OT to address the below listed deficits and maximize independence with basic ADLs prior to d/c to venue below. PTA pt was mod I with ADLs, h/o recurrent falls. Pt is currently min guard to min A with LB ADLs, toilet and tub shower transfers, and functional mobility. Pt has previously been using a cane to mobilize and reports feeling uncertain that a walker will fit in his living spaces. Pt appears to be much steadier with a rw, especially given his recent h/o falls. At this time recommending ST SNF for rehab prior to returning home, potentially back at cane level.      Follow Up Recommendations  SNF    Equipment Recommendations  3 in 1 bedside commode(likely needs bariatric/wider width)    Recommendations for Other Services       Precautions / Restrictions Precautions Precautions: Fall Precaution Comments: h/o falls Restrictions Weight Bearing Restrictions: No      Mobility Bed Mobility Overal bed mobility: Needs Assistance Bed Mobility: Supine to Sit     Supine to sit: Min guard;HOB elevated     General bed mobility comments: min guard to lightly steady trunk during power-up. HOB elevated. Extra time and effort. Pt reports he sleeps in standard recliner at home  Transfers Overall transfer level: Needs assistance Equipment used: Rolling walker (2 wheeled) Transfers: Sit to/from Stand Sit to Stand: Min guard;From elevated surface         General transfer comment: from EOB, to recliner. cues for technique with rw.  min guard for safety to lightly steady.    Balance Overall balance assessment: Needs assistance;History of Falls Sitting-balance support: No upper extremity supported;Feet supported Sitting balance-Leahy Scale: Fair     Standing balance support: Single extremity supported;Bilateral upper extremity supported;During functional activity Standing balance-Leahy Scale: Poor Standing balance comment: external support for balance                           ADL either performed or assessed with clinical judgement   ADL Overall ADL's : Needs assistance/impaired Eating/Feeding: Set up;Sitting   Grooming: Min guard;Wash/dry hands;Standing   Upper Body Bathing: Set up;Sitting   Lower Body Bathing: Minimal assistance;Sit to/from stand   Upper Body Dressing : Set up;Sitting   Lower Body Dressing: Minimal assistance;Sit to/from stand   Toilet Transfer: Min guard;Minimal assistance;Ambulation;Regular Toilet;Grab bars;RW Statistician Details (indicate cue type and reason): discussed fall prevention strategies related to toileting as pt has h/o fall in toilet area. Encouraged installation of grab bars at home and use of rw over toilet when standing to void. Toileting- Architect and Hygiene: Min guard;Sit to/from stand;Minimal assistance   Tub/ Shower Transfer: Tub transfer;Minimal assistance;Ambulation;Rolling walker   Functional mobility during ADLs: Minimal assistance;Rolling walker;Min guard General ADL Comments: Pt completed household distance functional mobility, stood to void at toilet, and completed grooming tasks in standing. Discussed fall prevention strategies including installation of grab bars and use of rw.     Vision Baseline Vision/History: Wears glasses Wears Glasses:  At all times       Perception     Praxis      Pertinent Vitals/Pain Pain Assessment: Faces Faces Pain Scale: Hurts little more Pain Location: back and LEs Pain Descriptors /  Indicators: Aching;Grimacing;Guarding Pain Intervention(s): Limited activity within patient's tolerance;Monitored during session;Repositioned     Hand Dominance Right   Extremity/Trunk Assessment Upper Extremity Assessment Upper Extremity Assessment: Generalized weakness   Lower Extremity Assessment Lower Extremity Assessment: Defer to PT evaluation   Cervical / Trunk Assessment Cervical / Trunk Assessment: Kyphotic(mass on R side of back, mid area. pt reports hernia )   Communication Communication Communication: No difficulties;HOH   Cognition Arousal/Alertness: Awake/alert Behavior During Therapy: WFL for tasks assessed/performed Overall Cognitive Status: Within Functional Limits for tasks assessed                                 General Comments: some slow processing   General Comments       Exercises     Shoulder Instructions      Home Living Family/patient expects to be discharged to:: Private residence Living Arrangements: Spouse/significant other Available Help at Discharge: Family;Available 24 hours/day Type of Home: House Home Access: Stairs to enter [employer] of Steps: 2 Entrance Stairs-Rails: Left Home Layout: One level     Bathroom Shower/Tub: Chief Strategy Officer: Standard Bathroom Accessibility: No   Home Equipment: Cane - single point   Additional Comments: pt reports narrow spaces in bathroom/shower areas.      Prior Functioning/Environment Level of Independence: Independent with assistive device(s);Needs assistance  Gait / Transfers Assistance Needed: cane with Modif I but has fallen ADL's / Homemaking Assistance Needed: Bathing and dressing self, was getting in tuband standing to shower   Comments: Used cane at all times, couple times a month sometimes walking dog and then in bathroom has gotten dizzy getting on and off toilet.  Pt still drives        OT Problem List: Decreased  strength;Decreased activity tolerance;Impaired balance (sitting and/or standing);Decreased knowledge of use of DME or AE;Decreased knowledge of precautions;Obesity;Increased edema;Pain;Cardiopulmonary status limiting activity      OT Treatment/Interventions: Self-care/ADL training;Therapeutic exercise;Energy conservation;DME and/or AE instruction;Therapeutic activities;Patient/family education;Balance training    OT Goals(Current goals can be found in the care plan section) Acute Rehab OT Goals Patient Stated Goal: stop falling OT Goal Formulation: With patient Time For Goal Achievement: 06/02/18 Potential to Achieve Goals: Good ADL Goals Pt Will Perform Grooming: with modified independence;standing;sitting Pt Will Perform Lower Body Bathing: with modified independence;sit to/from stand Pt Will Perform Lower Body Dressing: with modified independence;sit to/from stand Pt Will Transfer to Toilet: with modified independence;ambulating Pt Will Perform Toileting - Clothing Manipulation and hygiene: with modified independence;sit to/from stand Pt Will Perform Tub/Shower Transfer: Tub transfer;with supervision;ambulating;rolling walker  OT Frequency: Min 2X/week   Barriers to D/C:            Co-evaluation PT/OT/SLP Co-Evaluation/Treatment: Yes Reason for Co-Treatment: For patient/therapist safety   OT goals addressed during session: ADL's and self-care;Proper use of Adaptive equipment and DME      AM-PAC OT "6 Clicks" Daily Activity     Outcome Measure Help from another person eating meals?: None Help from another person taking care of personal grooming?: None Help from another person toileting, which includes using toliet, bedpan, or urinal?: A Little Help from another person bathing (including washing, rinsing, drying)?: A Little  Help from another person to put on and taking off regular upper body clothing?: None Help from another person to put on and taking off regular lower body  clothing?: A Little 6 Click Score: 21   End of Session Equipment Utilized During Treatment: Gait belt;Rolling walker  Activity Tolerance: Patient tolerated treatment well Patient left: in chair;with call bell/phone within reach;with chair alarm set  OT Visit Diagnosis: Unsteadiness on feet (R26.81);Other abnormalities of gait and mobility (R26.89);Repeated falls (R29.6);History of falling (Z91.81);Muscle weakness (generalized) (M62.81);Pain                Time: 2094-7096 OT Time Calculation (min): 24 min Charges:  OT General Charges $OT Visit: 1 Visit OT Evaluation $OT Eval Moderate Complexity: 1 Mod  Raynald Kemp, OT Acute Rehabilitation Services Pager: [phone] Office: [phone]   Pilar Grammes 05/19/2018, 12:05 PM

## 2018-05-19 NOTE — Progress Notes (Addendum)
Inpatient Diabetes Program Recommendations  AACE/ADA: New Consensus Statement on Inpatient Glycemic Control (2015)  Target Ranges:  Prepandial:   less than 140 mg/dL      Peak postprandial:   less than 180 mg/dL (1-2 hours)      Critically ill patients:  140 - 180 mg/dL   Lab Results  Component Value Date   GLUCAP 222 (H) 05/19/2018   HGBA1C 9.5 (H) 05/19/2018     Results for Dimitri, Jaqualin E (MRN [id number]) as of 05/19/2018 14:56  Ref. Range 05/18/2018 06:08 05/18/2018 11:14 05/18/2018 16:35 05/18/2018 21:13 05/19/2018 07:38 05/19/2018 11:43  Glucose-Capillary Latest Ref Range: 70 - 99 mg/dL 259 (H) 563 (H)  Novolog 2 units 140 (H)  Novolog 2 units 132 (H) 139 (H)  Novolog 2 units 222 (H)  Novolog 5 units      Review of Glycemic Control  Diabetes history: DM2  Outpatient Diabetes medications: Glipizide 10 mg BID                                                         Soliqua 44 units sq hs                                                         Metformin 1500 mg daily                                                           Current orders for Inpatient glycemic control: Novolog moderate correction (0-15 units) tid                                                                           Soliqua 44 units q evening (not on formulary)   Spoke with patient regarding Hgb A1c of 9.5% (average of 226mg /dl). Explained what an A1c is and what it measures. Reminded patient that goal A1c is 7% or less per ADA standards to prevent both acute and long-term complications. Explained to patient the extreme importance of good glucose control at home. Encouraged patient to check  CBGs at least bid at home (fasting and another check within the day) and to record all CBGs in a logbook for PCP to review. I told him he can check his cbg ANYTIME during the day but especially if he is feeling "lightheaded".   He states he has been on the [place] for about a year but "doesn't think it is working". He has  an MD in [place] that monitors his diabetes/writes his scripts. He states he does have a cbg machine at home but does not check his blood sugar.  I stressed the importance of monitoring his blood glucose at home as he  is taking medicine for his diabetes (currently taking three). When I discussed symptoms of Hypoglycemia (Silly, Sweaty, Shaky) he stated he has felt like that at times. Reviewed how to treat at home ( If near or less than 70 mg/dl, treat with 1/2 cup juice or soda or take glucose tablets. Check sugar 15 minutes after treatment. If sugar still near or less than 70 mg/dl and symptomatic, treat again and may need a snack with some protein (peanut butter with crackers, etc).  Patient states he has an appt with his PCP in 10 days but stated he may need to reschedule. Encouraged patient to make sure he does follow up with MD.  Hospital does not have patient's home med of [place] on formulary. As CBG has increased I do recommend he be started on basal insulin here. Spoke to his RN and she said patient may be d/c home today. I will contact MD regarding my recommendations if he is staying.   MD please consider the following:  1. Discontinue Soliqua (not available in the hospital)   2. Start Lantus 15 units q hs    -- Will follow during hospitalization.--  Jamelle Rushing RN, MSN Diabetes Coordinator Inpatient Glycemic Control Team Team Pager: [phone] (8am-5pm)

## 2018-05-19 NOTE — Progress Notes (Signed)
CM talked to patient at the bedside, he is requesting for SNF placement; Delice Bison SW made aware; Abelino Derrick [facility] [phone]

## 2018-05-20 LAB — BASIC METABOLIC PANEL WITH GFR
Anion gap: 11 (ref 5–15)
BUN: 39 mg/dL — ABNORMAL HIGH (ref 8–23)
CO2: 26 mmol/L (ref 22–32)
Calcium: 9.1 mg/dL (ref 8.9–10.3)
Chloride: 102 mmol/L (ref 98–111)
Creatinine, Ser: 1.78 mg/dL — ABNORMAL HIGH (ref 0.61–1.24)
GFR calc Af Amer: 42 mL/min — ABNORMAL LOW
GFR calc non Af Amer: 36 mL/min — ABNORMAL LOW
Glucose, Bld: 217 mg/dL — ABNORMAL HIGH (ref 70–99)
Potassium: 4.5 mmol/L (ref 3.5–5.1)
Sodium: 139 mmol/L (ref 135–145)

## 2018-05-20 LAB — CBC
HCT: 36.2 % — ABNORMAL LOW (ref 39.0–52.0)
Hemoglobin: 11.4 g/dL — ABNORMAL LOW (ref 13.0–17.0)
MCH: 27.8 pg (ref 26.0–34.0)
MCHC: 31.5 g/dL (ref 30.0–36.0)
MCV: 88.3 fL (ref 80.0–100.0)
Platelets: 174 10*3/uL (ref 150–400)
RBC: 4.1 MIL/uL — ABNORMAL LOW (ref 4.22–5.81)
RDW: 12.9 % (ref 11.5–15.5)
WBC: 11.2 10*3/uL — ABNORMAL HIGH (ref 4.0–10.5)
nRBC: 0 % (ref 0.0–0.2)

## 2018-05-20 LAB — GLUCOSE, CAPILLARY
Glucose-Capillary: 203 mg/dL — ABNORMAL HIGH (ref 70–99)
Glucose-Capillary: 268 mg/dL — ABNORMAL HIGH (ref 70–99)
Glucose-Capillary: 271 mg/dL — ABNORMAL HIGH (ref 70–99)
Glucose-Capillary: 294 mg/dL — ABNORMAL HIGH (ref 70–99)

## 2018-05-20 MED ORDER — FUROSEMIDE 80 MG PO TABS: 80.0000 mg | ORAL_TABLET | Freq: Every day | ORAL | Status: DC

## 2018-05-20 MED ORDER — FUROSEMIDE 10 MG/ML IJ SOLN: 60.0000 mg | Freq: Once | INTRAMUSCULAR | Status: DC

## 2018-05-20 MED ORDER — FUROSEMIDE 40 MG PO TABS: 40.0000 mg | ORAL_TABLET | Freq: Every evening | ORAL | Status: DC

## 2018-05-20 NOTE — Progress Notes (Signed)
PROGRESS NOTE    Noah [name]  KGS:[id number] DOB: 13-Oct-1940 DOA: 05/17/2018 PCP: Estanislado Pandy, MD      Brief Narrative:  Noah [name] is a 78 y.o. M with CHF EF 30%, HTN, CKD III baseline Cr 1.3, DM, CAD s/p remote PCI, OSA on CPAP and PAF on warfarin who presents with progressive recurrent falls and now [facility].  Two separate presenting complaints: progressive weakness, fatigue, falling   Syncope last Monday leading to striking head, now with small SAH on CT head: patient got up Monday, got dressed, hadn't had breakfast or meds yet, was walking in driveway with dog and passed out without warning, striking his head.  Was groggy afterwards, but no tongue biting or witnessed seizures.  Returned to normal, did not seek care until day of admission, was finishing urinating in bathroom, got dizzy and nearly passed out.  At that time, came to ER where was found to have SAH, small.         Assessment & Plan:  Acute on chronic systolic CHF On arrival to [facility], patient had dyspnea, orthopnea and worse than usual leg swelling.  Treated with IV lasix, swelling resolved, Cr worsened.  Net even yesterday on home diuretics but Cr worse.  Echo obtained, showed EF 40%, improved from previous.  No significant valvular disease.  -Hold Lasix today -Close monitoring renal function -Supplement K -Daily weights, strict I/Os -Continue BB -Hold Entresto today    Recurrent falls Progressive weakness Imbalance The patient has had proggressive weakness and recurrent falls that are independent of his syncope.  They do appear to correlate with about a 10 to 15 pound weight gain (adipose not water weight) over the last several months as documented by his 2 cardiologist and his family.  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  Ambien and Flexeril are also  associated with falls. -PT eval  Syncope Pacer interrogated, no VT/VF, no shocks, no significant AMS during Monday morning) syncope while walking the dog) or day of admission (post micturition near-syncope).  Echocardiogram shows improved ejection fraction since September, no new regional wall motion abnormalities or valvular disease.  He is not particularly orthostatic.  Doubt seizure.  Etiology somewhat unclear.  Subarachnoid hemorrhage No surgical intervention needed.  Low risk of rebleeding if warfarin restarted in short term. -Appreciate Neurosurgery guidance -Continue periodic neurochecks -Restart warfarin in 3 days  Diabetes -Continue gabapentin -Continue Lantus -Sliding scale corrections insulin  Hypertension Coronary disease secondary prevention -Continue aspirin, statin -Continue Imdur, Carvedilol  AKI on CKD III Creatinine worsening today. -Hold diuretics and Entresto today  OSA -Continue CPAP   Atrial fibrillation, paroxysmal CHA2DS2-Vasc 6.   -Hold warfarin, restart in 3 days  Other medications -Continue Pepcid, pantoprazole -Continue finasteride, Flomax -Continue fluoxetine           MDM and disposition: The below labs and imaging reports were reviewed and summarized above.  Medication management as above.  Presented with syncope, subarachnoid hemorrhage, and progressive weakness.  He was treated for a flare of acute congestive heart failure.  He now has worsening renal function, will hold diuretics and ACE and monitor, as we make arrangements for safe discharge plan.         DVT prophylaxis: SCDs Code Status: FULL Family Communication: None present    Consultants:   Neurosurgery  Procedures:   Echo  Antimicrobials:   None    Subjective: Leg swelling about at baseline.  Breathing feels about normal.  He has a lot of imbalance when he stands up, but no particular dyspnea, chest pain, palpitations, syncope, seizures, confusion,  fever.     Objective: Vitals:   05/19/18 2225 05/20/18 0041 05/20/18 0522 05/20/18 0835  BP: 116/68 131/70 129/76 130/83  Pulse:  72 73 81  Resp:  20 20   Temp:  (!) 97.5 F (36.4 C) 97.6 F (36.4 C)   TempSrc:  Oral Oral   SpO2:  100% 94% 97%  Weight:   127 kg   Height:        Intake/Output Summary (Last 24 hours) at 05/20/2018 1047 Last data filed at 05/20/2018 0535 Gross per 24 hour  Intake 480 ml  Output 300 ml  Net 180 ml   Filed Weights   05/18/18 2225 05/19/18 0317 05/20/18 0522  Weight: 128.4 kg 127.7 kg 127 kg    Examination: General appearance: Obese adult male, sitting in recliner no acute distress HEENT: Anicteric, conjunctival pink, lids and lashes normal.  No nasal deformity, discharge, or epistaxis.  Dentition normal, lips normal, oropharynx moist, no oral lesions, hearing normal. Skin: Warm and dry.  no jaundice.  No suspicious rashes or lesions. Cardiac: Regular rate and rhythm, soft systolic ejection murmur, pitting lower extremity edema to mid shin on the left, none on the right, JVP appears normal Respiratory: Respiratory effort normal, no wheezing, diminished throughout, atelectasis at bases, no rales Abdomen: Soft without tenderness to palpation, ascites, distention, hepatosplenomegaly MSK: No deformities or effusions.  She is loss of subcutaneous muscle mass and fat. Neuro: Awake and alert, extraocular movements intact, moves all extremities with global weakness, but symmetric strength, speech fluent. Psych: Sensorium intact and responding to questions, attention normal, affect normal, judgment and insight appear normal.    Data Reviewed: I have personally reviewed following labs and imaging studies:  CBC: Recent Labs  Lab 05/17/18 2228 05/19/18 0607 05/20/18 0547  WBC 9.4 9.5 11.2*  HGB 10.6* 11.4* 11.4*  HCT 34.2* 35.9* 36.2*  MCV 86.8 87.8 88.3  PLT 148* 165 174   Basic Metabolic Panel: Recent Labs  Lab 05/17/18 2228 05/19/18 0607  05/20/18 0547  NA 136 139 139  K 4.0 3.6 4.5  CL 101 101 102  CO2 24 28 26   GLUCOSE 202* 154* 217*  BUN 30* 26* 39*  CREATININE 1.40* 1.38* 1.78*  CALCIUM 9.1 8.9 9.1   GFR: Estimated Creatinine Clearance: 45.1 mL/min (A) (by C-G formula based on SCr of 1.78 mg/dL (H)). Liver Function Tests: Recent Labs  Lab 05/17/18 2228  AST 16  ALT 18  ALKPHOS 48  BILITOT 0.7  PROT 5.8*  ALBUMIN 2.7*   No results for input(s): LIPASE, AMYLASE in the last 168 hours. No results for input(s): AMMONIA in the last 168 hours. Coagulation Profile: Recent Labs  Lab 05/16/18 1401 05/17/18 2228  INR 2.2 1.15   Cardiac Enzymes: No results for input(s): CKTOTAL, CKMB, CKMBINDEX, TROPONINI in the last 168 hours. BNP (last 3 results) No results for input(s): PROBNP in the last 8760 hours. HbA1C: Recent Labs    05/19/18 0607  HGBA1C 9.5*   CBG: Recent Labs  Lab 05/18/18 2113 05/19/18 0738 05/19/18 1143 05/19/18 1634 05/19/18 2208  GLUCAP 132* 139* 222* 221* 303*   Lipid Profile: No results for input(s): CHOL, HDL, LDLCALC, TRIG, CHOLHDL, LDLDIRECT in the last 72 hours. Thyroid Function Tests: No results for input(s):  TSH, T4TOTAL, FREET4, T3FREE, THYROIDAB in the last 72 hours. Anemia Panel: No results for input(s): VITAMINB12, FOLATE, FERRITIN, TIBC, IRON, RETICCTPCT in the last 72 hours. Urine analysis: No results found for: COLORURINE, APPEARANCEUR, LABSPEC, PHURINE, GLUCOSEU, HGBUR, BILIRUBINUR, KETONESUR, PROTEINUR, UROBILINOGEN, NITRITE, LEUKOCYTESUR Sepsis Labs: @LABRCNTIP (procalcitonin:4,lacticacidven:4)  )No results found for this or any previous visit (from the past 240 hour(s)).       Radiology Studies: No results found.      Scheduled Meds: . acetaminophen  975 mg Oral QHS  . aspirin EC  81 mg Oral Daily  . atorvastatin  40 mg Oral q1800  . famotidine  10 mg Oral QHS  . finasteride  5 mg Oral QPM  . FLUoxetine  20 mg Oral QPM  . furosemide  60 mg  Intravenous Once  . [START ON 05/21/2018] furosemide  40 mg Oral QPM   And  . [START ON 05/21/2018] furosemide  80 mg Oral Daily  . gabapentin  100 mg Oral BID  . insulin aspart  0-15 Units Subcutaneous TID WC  . insulin aspart  0-5 Units Subcutaneous QHS  . insulin glargine  15 Units Subcutaneous QHS  . isosorbide mononitrate  30 mg Oral Daily  . loratadine  10 mg Oral Daily  . metoprolol  100 mg Oral BID  . pantoprazole  40 mg Oral Daily  . sacubitril-valsartan  1 tablet Oral BID  . tamsulosin  0.8 mg Oral QPM   Continuous Infusions:   LOS: 3 days    Time spent: 25 minutes    Alberteen Sam, MD [facility] 05/20/2018, 10:47 AM     Please page through AMION:  [email or web address] If 7PM-7AM, please contact night-coverage

## 2018-05-20 NOTE — Plan of Care (Signed)
  Problem: Clinical Measurements: Goal: Respiratory complications will improve Outcome: Progressing Goal: Cardiovascular complication will be avoided Outcome: Progressing   Problem: Activity: Goal: Risk for activity intolerance will decrease Outcome: Progressing   Problem: Nutrition: Goal: Adequate nutrition will be maintained Outcome: Progressing   Problem: Coping: Goal: Level of anxiety will decrease Outcome: Progressing   Problem: Elimination: Goal: Will not experience complications related to bowel motility Outcome: Progressing   Problem: Pain Managment: Goal: General experience of comfort will improve Outcome: Progressing   Problem: Safety: Goal: Ability to remain free from injury will improve Outcome: Progressing

## 2018-05-20 NOTE — Plan of Care (Signed)
  Problem: Clinical Measurements: Goal: Diagnostic test results will improve Outcome: Progressing Goal: Respiratory complications will improve Outcome: Progressing Goal: Cardiovascular complication will be avoided Outcome: Progressing   Problem: Activity: Goal: Risk for activity intolerance will decrease Outcome: Progressing   Problem: Pain Managment: Goal: General experience of comfort will improve Outcome: Progressing   Problem: Safety: Goal: Ability to remain free from injury will improve Outcome: Progressing   

## 2018-05-20 NOTE — Progress Notes (Signed)
Pt has declined CPAP- no machine in room

## 2018-05-20 NOTE — NC FL2 (Signed)
[place] MEDICAID FL2 LEVEL OF CARE SCREENING TOOL     IDENTIFICATION  Patient Name: Noah [name] Birthdate: Jul 21, 1940 Sex: male Admission Date (Current Location): 05/17/2018  [facility] and [place] Number:  Producer, television/film/video and Address:  [facility] [place], [address], [place], [place] [address]      Provider Number: [id number]  Attending Physician Name and Address:  Alberteen Sam, *  Relative Name and Phone Number:       Current Level of Care: Hospital Recommended Level of Care:   Prior Approval Number:    Date Approved/Denied:   PASRR Number: [id number]  Discharge Plan: SNF    Current Diagnoses: Patient Active Problem List   Diagnosis Date Noted  . Fall at home, initial encounter 05/18/2018  . Syncope 05/18/2018  . CKD (chronic kidney disease) stage 3, GFR 30-59 ml/min (HCC) 05/18/2018  . OSA (obstructive sleep apnea) 05/18/2018  . Subarachnoid hemorrhage (HCC) 05/17/2018  . Atrial fibrillation (HCC) 02/21/2018  . Encounter for therapeutic drug monitoring 02/21/2018  . Ischemic cardiomyopathy 01/21/2018  . 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 CAD S/P percutaneous coronary angioplasty 01/21/2018  . Chronic systolic (congestive) heart failure (HCC) 01/20/2018  . 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 Dyslipidemia, goal LDL below 70 01/16/2009  . OVERWEIGHT/OBESITY 01/16/2009  . DYSPNEA 01/16/2009    Orientation RESPIRATION BLADDER Height & Weight     Self, Time, Situation, Place  Normal Incontinent Weight: 280 lb (127 kg) Height:  5\' 8"  (172.7 cm)  BEHAVIORAL SYMPTOMS/MOOD NEUROLOGICAL BOWEL NUTRITION STATUS        Diet(See DC summary)  AMBULATORY STATUS COMMUNICATION OF NEEDS Skin   Limited Assist Verbally Normal                       Personal Care Assistance Level of Assistance  Bathing, Feeding, Dressing Bathing Assistance: Limited assistance Feeding  assistance: Independent Dressing Assistance: Limited assistance     Functional Limitations Info             SPECIAL CARE FACTORS FREQUENCY  PT (By licensed PT), OT (By licensed OT)     PT Frequency: 5 OT Frequency: 5            Contractures      Additional Factors Info  Code Status, Allergies Code Status Info: Full Code  Allergies Info: CODEINE, ERYTHROMYCIN-SULFISOXAZOLE            Current Medications (05/20/2018):  This is the current hospital active medication list Current Facility-Administered Medications  Medication Dose Route Frequency Provider Last Rate Last Dose  . acetaminophen (TYLENOL) tablet 650 mg  650 mg Oral Q6H PRN John Giovanni, MD   650 mg at 05/20/18 1015   Or  . acetaminophen (TYLENOL) suppository 650 mg  650 mg Rectal Q6H PRN John Giovanni, MD      . acetaminophen (TYLENOL) tablet 975 mg  975 mg Oral QHS Alberteen Sam, MD   975 mg at 05/19/18 2226  . aspirin EC tablet 81 mg  81 mg Oral Daily Danford, Earl Lites, MD   81 mg at 05/20/18 0830  . atorvastatin (LIPITOR) tablet 40 mg  40 mg Oral q1800 Alberteen Sam, MD   40 mg at 05/19/18 1701  . bisacodyl (DULCOLAX) EC tablet 5 mg  5 mg Oral Daily PRN Audrea Muscat T, NP   5 mg at 05/19/18 0013  . cyclobenzaprine (FLEXERIL) tablet 10  mg  10 mg Oral TID PRN Alberteen Sam, MD      . famotidine (PEPCID) tablet 10 mg  10 mg Oral QHS Alberteen Sam, MD   10 mg at 05/19/18 2226  . finasteride (PROSCAR) tablet 5 mg  5 mg Oral QPM Danford, Earl Lites, MD   5 mg at 05/19/18 1701  . FLUoxetine (PROZAC) capsule 20 mg  20 mg Oral QPM Danford, Earl Lites, MD   20 mg at 05/19/18 1701  . gabapentin (NEURONTIN) capsule 100 mg  100 mg Oral BID Alberteen Sam, MD   100 mg at 05/20/18 0829  . insulin aspart (novoLOG) injection 0-15 Units  0-15 Units Subcutaneous TID WC Danford, Earl Lites, MD   8 Units at 05/20/18 1231  . insulin aspart (novoLOG) injection 0-5  Units  0-5 Units Subcutaneous QHS Alberteen Sam, MD   3 Units at 05/19/18 2232  . insulin glargine (LANTUS) injection 15 Units  15 Units Subcutaneous QHS Alberteen Sam, MD   15 Units at 05/19/18 2232  . isosorbide mononitrate (IMDUR) 24 hr tablet 30 mg  30 mg Oral Daily Danford, Earl Lites, MD   30 mg at 05/20/18 0830  . loratadine (CLARITIN) tablet 10 mg  10 mg Oral Daily Danford, Earl Lites, MD   10 mg at 05/20/18 0830  . metoprolol succinate (TOPROL-XL) 24 hr tablet 100 mg  100 mg Oral BID Alberteen Sam, MD   100 mg at 05/20/18 0830  . pantoprazole (PROTONIX) EC tablet 40 mg  40 mg Oral Daily Danford, Earl Lites, MD   40 mg at 05/20/18 0830  . tamsulosin (FLOMAX) capsule 0.8 mg  0.8 mg Oral QPM Danford, Earl Lites, MD   0.8 mg at 05/19/18 1701  . zolpidem (AMBIEN) tablet 5 mg  5 mg Oral QHS PRN John Giovanni, MD   5 mg at 05/19/18 2226     Discharge Medications: Please see discharge summary for a list of discharge medications.  Relevant Imaging Results:  Relevant Lab Results:   Additional Information    Donnie Coffin, LCSW

## 2018-05-21 LAB — BASIC METABOLIC PANEL
Anion gap: 10 (ref 5–15)
BUN: 47 mg/dL — ABNORMAL HIGH (ref 8–23)
CO2: 27 mmol/L (ref 22–32)
Calcium: 9 mg/dL (ref 8.9–10.3)
Chloride: 98 mmol/L (ref 98–111)
Creatinine, Ser: 2.05 mg/dL — ABNORMAL HIGH (ref 0.61–1.24)
GFR calc Af Amer: 35 mL/min — ABNORMAL LOW (ref >60)
GFR calc non Af Amer: 30 mL/min — ABNORMAL LOW (ref >60)
Glucose, Bld: 281 mg/dL — ABNORMAL HIGH (ref 70–99)
Potassium: 4.3 mmol/L (ref 3.5–5.1)
Sodium: 135 mmol/L (ref 135–145)

## 2018-05-21 LAB — CBC
HCT: 36.4 % — ABNORMAL LOW (ref 39.0–52.0)
Hemoglobin: 11.5 g/dL — ABNORMAL LOW (ref 13.0–17.0)
MCH: 28 pg (ref 26.0–34.0)
MCHC: 31.6 g/dL (ref 30.0–36.0)
MCV: 88.6 fL (ref 80.0–100.0)
Platelets: 171 10*3/uL (ref 150–400)
RBC: 4.11 MIL/uL — ABNORMAL LOW (ref 4.22–5.81)
RDW: 12.8 % (ref 11.5–15.5)
WBC: 10.1 10*3/uL (ref 4.0–10.5)
nRBC: 0 % (ref 0.0–0.2)

## 2018-05-21 LAB — GLUCOSE, CAPILLARY
Glucose-Capillary: 230 mg/dL — ABNORMAL HIGH (ref 70–99)
Glucose-Capillary: 340 mg/dL — ABNORMAL HIGH (ref 70–99)
Glucose-Capillary: 255 mg/dL — ABNORMAL HIGH (ref 70–99)
Glucose-Capillary: 266 mg/dL — ABNORMAL HIGH (ref 70–99)

## 2018-05-21 MED ORDER — SODIUM CHLORIDE 0.9 % IV SOLN
INTRAVENOUS | Status: AC
  Administered 2018-05-21: 09:00:00 via INTRAVENOUS

## 2018-05-21 NOTE — Progress Notes (Signed)
PROGRESS NOTE    Noah [name]  EGB:[id number] DOB: [birth year] DOA: 05/17/2018 PCP: Estanislado Pandy, MD      Brief Narrative:  Noah [name] is a 78 y.o. M with CHF EF 30%, HTN, CKD III baseline Cr 1.3, DM, CAD s/p remote PCI, OSA on CPAP and PAF on warfarin who presents with progressive recurrent falls and now [facility].  Two separate presenting complaints: progressive weakness, fatigue, falling   Syncope last Monday leading to striking head, now with small SAH on CT head: patient got up Monday, got dressed, hadn't had breakfast or meds yet, was walking in driveway with dog and passed out without warning, striking his head.  Was groggy afterwards, but no tongue biting or witnessed seizures.  Returned to normal, did not seek care until day of admission, was finishing urinating in bathroom, got dizzy and nearly passed out.  At that time, came to ER where was found to have SAH, small.         Assessment & Plan:    Recurrent falls Progressive weakness Imbalance The patient has had proggressive weakness and recurrent falls that are independent of his syncope.  They do appear to correlate with about a 10 to 15 pound weight gain (adipose not water weight) over the last several months as documented by his 2 cardiologist and his family.  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  Ambien and Flexeril are also associated with falls. -PT eval  Syncope Pacer interrogated, no VT/VF, no shocks, no significant AMS during Monday morning) syncope while walking the dog) or day of admission (post micturition near-syncope).  Echocardiogram shows improved ejection fraction since September, no new regional wall motion abnormalities or valvular disease.  He is not particularly orthostatic.  Doubt seizure.  Etiology somewhat unclear.  Subarachnoid hemorrhage No  surgical intervention needed.  Low risk of rebleeding if warfarin restarted in short term. -Appreciate Neurosurgery guidance -Continue periodic neurochecks -Restart warfarin in 2 days  Acute on chronic systolic CHF On arrival to [facility], patient had worse than usual dyspnea and orthopnea and worse than usual leg swelling.  Treated with IV lasix, swelling resolved, Cr worsened.  Echo obtained, showed EF 40%, improved from previous.  No significant valvular disease.   Davitt diuretics again yesterday and today.  Cr worse. -Close monitoring renal function -Supplement K -Daily weights, strict I/Os -Continue BB -Hold Entresto today  AKI on CKD III Creatinine worsening again to 2.0 mg/dL -Hold diuretics and Entersto again   Diabetes Glucoses good. -Continue gabapentin -Continue Lantus -Continue SSI correction insulin  Hypertension Coronary disease secondary prevention -Continue aspirin, statin, Imdur, Carvedilol  OSA -Continue CPAP   Atrial fibrillation, paroxysmal CHA2DS2-Vasc 6.   -Hold warfarin, restart in 2 days  Other medications -Continue Pepcid, pantoprazole -Continue finasteride, Flomax -Continue fluoxetine           MDM and disposition: The below labs and imaging reports are reviewed and summarized above.  Medication management as above.  The patient presented with syncope, subarachnoid hemorrhage, and progressive weakness.  He was treated for failure with acute congestive heart failure, and his creatinine is now worsening after diuresis.  We will cease diuresis, ACE inhibitor, and monitor creatinine closely as we make arrangements for safe discharge plan.           DVT prophylaxis: SCDs Code Status: FULL Family Communication: None  present    Consultants:   Neurosurgery  Procedures:   Echo  Antimicrobials:   None    Subjective: Leg swelling at baseline.  Breathing feels normal.  Some dizziness and imbalance with walking, but no dyspnea,  chest pain, palpitations, syncope, seizure, confusion, fever.     Objective: Vitals:   05/20/18 0835 05/20/18 1639 05/20/18 2054 05/21/18 0358  BP: 130/83 106/61 (!) 154/99 122/62  Pulse: 81 70 93 76  Resp:  18 20 (!) 22  Temp:  97.8 F (36.6 C) 98 F (36.7 C)   TempSrc:  Oral    SpO2: 97%  100% 98%  Weight:    128.4 kg  Height:        Intake/Output Summary (Last 24 hours) at 05/21/2018 1339 Last data filed at 05/21/2018 1200 Gross per 24 hour  Intake 480 ml  Output 1400 ml  Net -920 ml   Filed Weights   05/19/18 0317 05/20/18 0522 05/21/18 0358  Weight: 127.7 kg 127 kg 128.4 kg    Examination: General appearance: Obese adult male, lying in bed, no acute distress HEENT: Anicteric, conjunctival pink, lids and lashes normal.  No nasal deformity, discharge, or epistaxis.  Dentition normal, lips normal, oropharynx moist, no oral lesions, hearing normal. Skin: Warm and dry.  no jaundice.  No suspicious rashes or lesions. Cardiac: Regular rate and rhythm, soft systolic ejection murmur, no lower extremity edema, JVP normal Respiratory: Respiratory effort normal, no wheezing or rales. Abdomen: Abdomen soft without tenderness palpation, ascites, distention, hepatosplenomegaly MSK: No deformities or effusions.  She is loss of subcutaneous muscle mass and fat. Neuro: Awake and alert, extraocular movements intact, moves all extremities with global weakness but symmetric strength, speech fluent. Psych: Sensorium intact responding to questions, affect normal, judgment and insight appear normal.     Data Reviewed: I have personally reviewed following labs and imaging studies:  CBC: Recent Labs  Lab 05/17/18 2228 05/19/18 0607 05/20/18 0547 05/21/18 0459  WBC 9.4 9.5 11.2* 10.1  HGB 10.6* 11.4* 11.4* 11.5*  HCT 34.2* 35.9* 36.2* 36.4*  MCV 86.8 87.8 88.3 88.6  PLT 148* 165 174 171   Basic Metabolic Panel: Recent Labs  Lab 05/17/18 2228 05/19/18 0607 05/20/18 0547  05/21/18 0459  NA 136 139 139 135  K 4.0 3.6 4.5 4.3  CL 101 101 102 98  CO2 24 28 26 27   GLUCOSE 202* 154* 217* 281*  BUN 30* 26* 39* 47*  CREATININE 1.40* 1.38* 1.78* 2.05*  CALCIUM 9.1 8.9 9.1 9.0   GFR: Estimated Creatinine Clearance: 39.4 mL/min (A) (by C-G formula based on SCr of 2.05 mg/dL (H)). Liver Function Tests: Recent Labs  Lab 05/17/18 2228  AST 16  ALT 18  ALKPHOS 48  BILITOT 0.7  PROT 5.8*  ALBUMIN 2.7*   No results for input(s): LIPASE, AMYLASE in the last 168 hours. No results for input(s): AMMONIA in the last 168 hours. Coagulation Profile: Recent Labs  Lab 05/16/18 1401 05/17/18 2228  INR 2.2 1.15   Cardiac Enzymes: No results for input(s): CKTOTAL, CKMB, CKMBINDEX, TROPONINI in the last 168 hours. BNP (last 3 results) No results for input(s): PROBNP in the last 8760 hours. HbA1C: Recent Labs    05/19/18 0607  HGBA1C 9.5*   CBG: Recent Labs  Lab 05/20/18 1157 05/20/18 1633 05/20/18 2151 05/21/18 0747 05/21/18 1305  GLUCAP 268* 271* 294* 230* 340*   Lipid Profile: No results for input(s): CHOL, HDL, LDLCALC, TRIG, CHOLHDL, LDLDIRECT in the last 72 hours.  Thyroid Function Tests: No results for input(s): TSH, T4TOTAL, FREET4, T3FREE, THYROIDAB in the last 72 hours. Anemia Panel: No results for input(s): VITAMINB12, FOLATE, FERRITIN, TIBC, IRON, RETICCTPCT in the last 72 hours. Urine analysis: No results found for: COLORURINE, APPEARANCEUR, LABSPEC, PHURINE, GLUCOSEU, HGBUR, BILIRUBINUR, KETONESUR, PROTEINUR, UROBILINOGEN, NITRITE, LEUKOCYTESUR Sepsis Labs: @LABRCNTIP (procalcitonin:4,lacticacidven:4)  )No results found for this or any previous visit (from the past 240 hour(s)).       Radiology Studies: No results found.      Scheduled Meds: . acetaminophen  975 mg Oral QHS  . aspirin EC  81 mg Oral Daily  . atorvastatin  40 mg Oral q1800  . famotidine  10 mg Oral QHS  . finasteride  5 mg Oral QPM  . FLUoxetine  20 mg  Oral QPM  . gabapentin  100 mg Oral BID  . insulin aspart  0-15 Units Subcutaneous TID WC  . insulin aspart  0-5 Units Subcutaneous QHS  . insulin glargine  15 Units Subcutaneous QHS  . isosorbide mononitrate  30 mg Oral Daily  . loratadine  10 mg Oral Daily  . metoprolol  100 mg Oral BID  . pantoprazole  40 mg Oral Daily  . tamsulosin  0.8 mg Oral QPM   Continuous Infusions: . sodium chloride 75 mL/hr at 05/21/18 0902     LOS: 4 days    Time spent: 25 minutes    Alberteen Samhristopher P Rockell Faulks, MD [facility] 05/21/2018, 1:39 PM     Please page through AMION:  [email or web address] Password [email or web address] If 7PM-7AM, please contact night-coverage

## 2018-05-22 LAB — GLUCOSE, CAPILLARY
GLUCOSE-CAPILLARY: 246 mg/dL — AB (ref 70–99)
Glucose-Capillary: 193 mg/dL — ABNORMAL HIGH (ref 70–99)
Glucose-Capillary: 293 mg/dL — ABNORMAL HIGH (ref 70–99)
Glucose-Capillary: 268 mg/dL — ABNORMAL HIGH (ref 70–99)

## 2018-05-22 LAB — BASIC METABOLIC PANEL
Anion gap: 12 (ref 5–15)
BUN: 46 mg/dL — ABNORMAL HIGH (ref 8–23)
CO2: 25 mmol/L (ref 22–32)
Calcium: 8.9 mg/dL (ref 8.9–10.3)
Chloride: 102 mmol/L (ref 98–111)
Creatinine, Ser: 1.63 mg/dL — ABNORMAL HIGH (ref 0.61–1.24)
GFR, EST AFRICAN AMERICAN: 46 mL/min — AB (ref >60)
GFR, EST NON AFRICAN AMERICAN: 40 mL/min — AB (ref >60)
Glucose, Bld: 259 mg/dL — ABNORMAL HIGH (ref 70–99)
Potassium: 4.2 mmol/L (ref 3.5–5.1)
Sodium: 139 mmol/L (ref 135–145)

## 2018-05-22 LAB — CBC
HCT: 35.8 % — ABNORMAL LOW (ref 39.0–52.0)
Hemoglobin: 10.9 g/dL — ABNORMAL LOW (ref 13.0–17.0)
MCH: 26.8 pg (ref 26.0–34.0)
MCHC: 30.4 g/dL (ref 30.0–36.0)
MCV: 88.2 fL (ref 80.0–100.0)
Platelets: 159 10*3/uL (ref 150–400)
RBC: 4.06 MIL/uL — ABNORMAL LOW (ref 4.22–5.81)
RDW: 12.6 % (ref 11.5–15.5)
WBC: 9.7 10*3/uL (ref 4.0–10.5)
nRBC: 0 % (ref 0.0–0.2)

## 2018-05-22 MED ORDER — INSULIN ASPART 100 UNIT/ML ~~LOC~~ SOLN
5.0000 [IU] | Freq: Three times a day (TID) | SUBCUTANEOUS | Status: DC
  Administered 2018-05-22 – 2018-05-23 (×3): 5 [IU] via SUBCUTANEOUS

## 2018-05-22 MED ORDER — DIPHENHYDRAMINE HCL 12.5 MG/5ML PO ELIX
12.5000 mg | ORAL_SOLUTION | Freq: Once | ORAL | Status: AC
  Administered 2018-05-23: 12.5 mg via ORAL
  Filled 2018-05-22: qty 5

## 2018-05-22 MED ORDER — INSULIN GLARGINE 100 UNIT/ML ~~LOC~~ SOLN
20.0000 [IU] | Freq: Every day | SUBCUTANEOUS | Status: DC
  Administered 2018-05-22: 20 [IU] via SUBCUTANEOUS
  Filled 2018-05-22 (×2): qty 0.2

## 2018-05-22 NOTE — Progress Notes (Addendum)
Inpatient Diabetes Program Recommendations  AACE/ADA: New Consensus Statement on Inpatient Glycemic Control (2015)  Target Ranges:  Prepandial:   less than 140 mg/dL      Peak postprandial:   less than 180 mg/dL (1-2 hours)      Critically ill patients:  140 - 180 mg/dL   Lab Results  Component Value Date   GLUCAP 193 (H) 05/22/2018   HGBA1C 9.5 (H) 05/19/2018    Review of Glycemic Control Results for MARCELIS, SCACCO (MRN [id number]) as of 05/22/2018 11:26  Ref. Range 05/21/2018 07:47 05/21/2018 13:05 05/21/2018 16:19 05/21/2018 21:25 05/22/2018 07:41  Glucose-Capillary Latest Ref Range: 70 - 99 mg/dL 646 (H) 803 (H) 212 (H) 266 (H) 193 (H)   Inpatient Diabetes Program Recommendations:   Received total of 25 units Novolog correction over the past 24 hrs. -Increase Lantus to 20 units qd -Add Novolog 5 units tid meal coverage if eats 50%  Thank you, Darel Hong E. Dayja Loveridge, RN, MSN, CDE  Diabetes Coordinator Inpatient Glycemic Control Team Team Pager [phone] (8am-5pm) 05/22/2018 11:28 AM

## 2018-05-22 NOTE — Clinical Social Work Note (Signed)
Clinical Social Work Assessment  Patient Details  Name: Noah [name] MRN: [id number] Date of Birth: 1941/02/22  Date of referral:  05/22/18               Reason for consult:  Facility Placement, Discharge Planning                Permission sought to share information with:  Chartered certified accountant granted to share information::  Yes, Verbal Permission Granted  Name::        Agency::  SNF's  Relationship::     Contact Information:     Housing/Transportation Living arrangements for the past 2 months:  Single Family Home Source of Information:  Patient, Medical Team Patient Interpreter Needed:  None Criminal Activity/Legal Involvement Pertinent to Current Situation/Hospitalization:  No - Comment as needed Significant Relationships:  Spouse Lives with:  Spouse Do you feel safe going back to the place where you live?  Yes Need for family participation in patient care:  Yes (Comment)  Care giving concerns:  PT recommending SNF once medically stable for discharge.   Social Worker assessment / plan:  CSW met with patient. No supports at bedside. CSW introduced role and explained that PT recommendations would be discussed. Patient agreeable to SNF placement. First preference is [facility], second is [facility], and third is [facility]. [facility] has offered a bed and will start insurance authorization. Per MD, patient has potential to be downgraded to HHPT prior to authorization decision and wants patient to be aware of this. No further concerns. CSW encouraged patient to contact CSW as needed. CSW will continue to follow patient for support and facilitate discharge to SNF once insurance authorization obtained.  Employment status:  Retired Nurse, adult PT Recommendations:  [place] / Referral to community resources:  [place]  Patient/Family's Response to care:  Patient  agreeable to SNF placement. Patient's wife supportive and involved in patient's care. Patient appreciated social work intervention.  Patient/Family's Understanding of and Emotional Response to Diagnosis, Current Treatment, and Prognosis:  Patient has a good understanding of the reason for admission and his need for rehab prior to returning home. Patient appears happy with hospital care.  Emotional Assessment Appearance:  Appears stated age Attitude/Demeanor/Rapport:  Engaged, Gracious Affect (typically observed):  Accepting, Appropriate, Calm, Pleasant Orientation:  Oriented to Self, Oriented to Place, Oriented to  Time, Oriented to Situation Alcohol / Substance use:  Never Used Psych involvement (Current and /or in the community):  No (Comment)  Discharge Needs  Concerns to be addressed:  Care Coordination Readmission within the last 30 days:  No Current discharge risk:  Dependent with Mobility Barriers to Discharge:  [place], LCSW 05/22/2018, 9:58 AM

## 2018-05-22 NOTE — Progress Notes (Signed)
PROGRESS NOTE    Noah [name]  WJX:[id number] DOB: [birth year] DOA: 05/17/2018 PCP: Estanislado Pandy, MD      Brief Narrative:  Noah [name] is a 78 y.o. M with CHF EF 30%, HTN, CKD III baseline Cr 1.3, DM, CAD s/p remote PCI, OSA on CPAP and PAF on warfarin who presents with progressive recurrent falls and now [facility].  Two separate presenting complaints: progressive weakness, fatigue, falling   Syncope last Monday leading to striking head, now with small SAH on CT head: patient got up Monday, got dressed, hadn't had breakfast or meds yet, was walking in driveway with dog and passed out without warning, striking his head.  Was groggy afterwards, but no tongue biting or witnessed seizures.  Returned to normal, did not seek care until day of admission, was finishing urinating in bathroom, got dizzy and nearly passed out.  At that time, came to ER where was found to have SAH, small.        Assessment & Plan:    Recurrent falls Progressive weakness Imbalance The patient has had proggressive weakness and recurrent falls that are independent of his syncope.  They do appear to correlate with about a 10 to 15 pound weight gain (adipose not water weight) over the last several months as documented by his 2 cardiologist and his family.  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  Ambien and Flexeril are also associated with falls. -PT eval  Syncope Pacer interrogated, no VT/VF, no shocks, no significant AMS during Monday morning) syncope while walking the dog) or day of admission (post micturition near-syncope).  Echocardiogram shows improved ejection fraction since September, no new regional wall motion abnormalities or valvular disease.  He is not particularly orthostatic.  Doubt seizure.  Etiology somewhat unclear.  Subarachnoid hemorrhage No surgical  intervention needed.  Low risk of rebleeding if warfarin restarted in short term. -Appreciate Neurosurgery guidance -Continue periodic neurochecks -Restart warfarin in tomorrow  Acute on chronic systolic CHF On arrival to [facility], patient had worse than usual dyspnea and orthopnea and worse than usual leg swelling.  Treated with IV lasix, swelling resolved, Cr worsened.  Echo obtained, showed EF 40%, improved from previous.  No significant valvular disease.   Holding diuretics and gave fluids yesterday, Cr improved.  -Close monitoring renal function -Daily weights, strict I/Os -Continue metoprolol -Hold Entresto  AKI on CKD III Creatinine worsening again to 2.0 mg/dL -Hold Lasix -Hold Entresto  Diabetes Glucoses good but using large amounts of correction insulin. -Continue gabapentin -Continue Lantus, increase dose -Continue SSI correction insulin -Add mealtime insulin  Hypertension Coronary disease secondary prevention Blood pressure low normal -Continue aspirin, statin, Imdur, carvedilol    OSA -Continue CPAP   Atrial fibrillation, paroxysmal CHA2DS2-Vasc 6.   -Hold warfarin, restart tomorrow  Other medications -Continue Pepcid, pantoprazole, finasteride, Flomax, fluoxetine -Stop Ambien         MDM and disposition: The below labs and imaging reports were reviewed and summarized above.  Medication management as above.  The patient presented with syncope, subarachnoid hemorrhage, and progressive weakness.  He was treated for a flare of congestive heart failure, developed acute kidney injury.  Her currently holding diuretics as we await safe placement in skilled rehab.         DVT prophylaxis: SCDs Code Status: FULL Family Communication: None present    Consultants:  Neurosurgery  Procedures:   Echo 1/30 IMPRESSIONS    1. The left ventricle has mild-moderately reduced systolic function of 40-45%. The cavity size is normal. There is moderate  left ventricular wall thickness. Echo evidence of unable to assess diastolic filling patterns.  2. Hypokinesis if the mid-apical inferior, inferoseptal, anteroseptal and apical myocardium.  3. Normal left atrial size.  4. Normal right atrial size.  5. Normal tricuspid valve.  6. The inferior vena cava was dilated in size with >50% respiratory variablity.  7. No atrial level shunt detected by color flow Doppler.   Antimicrobials:   None    Subjective: Leg swelling at baseline.  Breathing feels okay.  Improved ambulation no dyspnea, chest pain, palpitations, syncope, seizure, confusion, focal weakness, fever, focal numbness, speech disturbance, visual changes.     Objective: Vitals:   05/21/18 2308 05/22/18 0504 05/22/18 0908 05/22/18 1129  BP: (!) 160/82 (!) 151/72 (!) 157/72 (!) 140/55  Pulse: 76 72 75 66  Resp:  20    Temp:  97.6 F (36.4 C)  97.7 F (36.5 C)  TempSrc:      SpO2:  97%  91%  Weight:  128.4 kg    Height:        Intake/Output Summary (Last 24 hours) at 05/22/2018 1509 Last data filed at 05/21/2018 2326 Gross per 24 hour  Intake 360 ml  Output 400 ml  Net -40 ml   Filed Weights   05/20/18 0522 05/21/18 0358 05/22/18 0504  Weight: 127 kg 128.4 kg 128.4 kg    Examination: General appearance: Obese adult male, sitting in recliner, no acute distress HEENT: Anicteric, conjunctival pink, lids and lashes normal.  No nasal deformity, discharge, or epistaxis.  Dentition normal, lips normal.  Oropharynx moist, no oral lesions, hearing normal.   Skin: Warm and dry, no jaundice, venous stasis changes in bilateral lower extremities. Cardiac: Regular rate and rhythm, soft systolic murmur, stable left-sided pitting lower extremity edema to the midshin, JVP normal. Respiratory: Respiratory effort normal, no rales or wheezes. Abdomen: Abdomen soft without tenderness to palpation, ascites, distention, or hepatosplenomegaly. MSK: No deformities or effusions.  She is loss of  subcutaneous muscle mass and fat. Neuro: Awake and alert, extraocular movements intact, moves all extremities with global weakness but symmetric strength, speech fluent. Psych: Term intact and responding to questions, oriented to person, place, and time.  Affect normal, judgment and insight appear normal.     Data Reviewed: I have personally reviewed following labs and imaging studies:  CBC: Recent Labs  Lab 05/17/18 2228 05/19/18 0607 05/20/18 0547 05/21/18 0459 05/22/18 0335  WBC 9.4 9.5 11.2* 10.1 9.7  HGB 10.6* 11.4* 11.4* 11.5* 10.9*  HCT 34.2* 35.9* 36.2* 36.4* 35.8*  MCV 86.8 87.8 88.3 88.6 88.2  PLT 148* 165 174 171 159   Basic Metabolic Panel: Recent Labs  Lab 05/17/18 2228 05/19/18 0607 05/20/18 0547 05/21/18 0459 05/22/18 0335  NA 136 139 139 135 139  K 4.0 3.6 4.5 4.3 4.2  CL 101 101 102 98 102  CO2 24 28 26 27 25   GLUCOSE 202* 154* 217* 281* 259*  BUN 30* 26* 39* 47* 46*  CREATININE 1.40* 1.38* 1.78* 2.05* 1.63*  CALCIUM 9.1 8.9 9.1 9.0 8.9   GFR: Estimated Creatinine Clearance: 49.6 mL/min (A) (by C-G formula based on SCr of 1.63 mg/dL (H)). Liver Function Tests: Recent Labs  Lab 05/17/18 2228  AST 16  ALT 18  ALKPHOS 48  BILITOT 0.7  PROT 5.8*  ALBUMIN 2.7*   No results for input(s): LIPASE, AMYLASE in the last 168 hours. No results for input(s): AMMONIA in the last 168 hours. Coagulation Profile: Recent Labs  Lab 05/16/18 1401 05/17/18 2228  INR 2.2 1.15   Cardiac Enzymes: No results for input(s): CKTOTAL, CKMB, CKMBINDEX, TROPONINI in the last 168 hours. BNP (last 3 results) No results for input(s): PROBNP in the last 8760 hours. HbA1C: No results for input(s): HGBA1C in the last 72 hours. CBG: Recent Labs  Lab 05/21/18 1305 05/21/18 1619 05/21/18 2125 05/22/18 0741 05/22/18 1126  GLUCAP 340* 255* 266* 193* 293*   Lipid Profile: No results for input(s): CHOL, HDL, LDLCALC, TRIG, CHOLHDL, LDLDIRECT in the last 72  hours. Thyroid Function Tests: No results for input(s): TSH, T4TOTAL, FREET4, T3FREE, THYROIDAB in the last 72 hours. Anemia Panel: No results for input(s): VITAMINB12, FOLATE, FERRITIN, TIBC, IRON, RETICCTPCT in the last 72 hours. Urine analysis: No results found for: COLORURINE, APPEARANCEUR, LABSPEC, PHURINE, GLUCOSEU, HGBUR, BILIRUBINUR, KETONESUR, PROTEINUR, UROBILINOGEN, NITRITE, LEUKOCYTESUR Sepsis Labs: @LABRCNTIP (procalcitonin:4,lacticacidven:4)  )No results found for this or any previous visit (from the past 240 hour(s)).       Radiology Studies: No results found.      Scheduled Meds: . acetaminophen  975 mg Oral QHS  . aspirin EC  81 mg Oral Daily  . atorvastatin  40 mg Oral q1800  . famotidine  10 mg Oral QHS  . finasteride  5 mg Oral QPM  . FLUoxetine  20 mg Oral QPM  . gabapentin  100 mg Oral BID  . insulin aspart  0-15 Units Subcutaneous TID WC  . insulin aspart  0-5 Units Subcutaneous QHS  . insulin glargine  15 Units Subcutaneous QHS  . isosorbide mononitrate  30 mg Oral Daily  . loratadine  10 mg Oral Daily  . metoprolol  100 mg Oral BID  . pantoprazole  40 mg Oral Daily  . tamsulosin  0.8 mg Oral QPM   Continuous Infusions:    LOS: 4 days    Time spent: 25 minutes    Alberteen Sam, MD [facility] 05/22/2018, 3:09 PM     Please page through AMION:  [email or web address] Password [email or web address] If 7PM-7AM, please contact night-coverage

## 2018-05-22 NOTE — Clinical Social Work Note (Signed)
Notified patient of discussion with MD regarding potential to be downgraded to HHPT before we get insurance authorization. Patient believes he still needs SNF to work on balance and strength. Per RN, patient had just walked the length of the hallway with the ambulation tech.  Charlynn Court, CSW [phone]

## 2018-05-22 NOTE — Progress Notes (Signed)
Physical Therapy Treatment Patient Details Name: Noah [name] MRN: [id number] DOB: 02-11-1941 Today's Date: 05/22/2018    History of Present Illness 78 y.o. M with CHF EF 30%, HTN, CKD III baseline Cr 1.3, DM, CAD s/p remote PCI, OSA on CPAP and PAF on warfarin who presents with progressive recurrent falls and now [facility].    PT Comments    Pt admitted with above diagnosis. Pt currently with functional limitations due to the deficits listed below (see PT Problem List). Pt was able to ambulate with RW with good stability however pt cannot use the RW in his home and usses cane as it is an old house and he can't use the RW in house.  Without RW, pt loses balance with challenges.  Pt really needs SNF for rehab since he has fallen at home several times.  Will continue to follow pt.  Pt will benefit from skilled PT to increase their independence and safety with mobility to allow discharge to the venue listed below.     Follow Up Recommendations  SNF;Supervision/Assistance - 24 hour     Equipment Recommendations  Rolling walker with 5" wheels;3in1 (PT)    Recommendations for Other Services       Precautions / Restrictions Precautions Precautions: Fall Precaution Comments: h/o falls Restrictions Weight Bearing Restrictions: No    Mobility  Bed Mobility               General bed mobility comments: in chair  Transfers Overall transfer level: Needs assistance Equipment used: Rolling walker (2 wheeled) Transfers: Sit to/from Stand Sit to Stand: Min guard;From elevated surface         General transfer comment: from recliner, cues for technique with rw. min guard for safety to lightly steady.  Pt did lose balance posterior and needed steadying assist.  Pt cannot stand with out at least 1 UE support.    Ambulation/Gait Ambulation/Gait assistance: Min guard;Min assist;+2 safety/equipment Gait Distance (Feet): 250 Feet Assistive device: Rolling walker (2 wheeled);1 person hand [place]  assist;None Gait Pattern/deviations: Step-through pattern;Decreased stride length;Drifts right/left;Trunk flexed;Leaning posteriorly;Decreased step length - right;Decreased step length - left   Gait velocity interpretation: <1.31 ft/sec, indicative of household ambulator General Gait Details: Pt was able to ambulate with RW with good stability overall.  Cannot accept challenges to balance and definitely relies on RW.   Performed some high level balance activities.  Pt cannot stand with narrow BOS or turn head with ambulation without losing balance needing min assist.  Pt states he can't use RW in home so walked wtihout the RW and pt losing balance with challenges.    Stairs Stairs: Yes Stairs assistance: Min assist Stair Management: One rail Right;Step to pattern;Forwards Number of Stairs: 3 General stair comments: Needed min assist to ascend steps.    Wheelchair Mobility    Modified Rankin (Stroke Patients Only)       Balance Overall balance assessment: Needs assistance;History of Falls Sitting-balance support: No upper extremity supported;Feet supported Sitting balance-Leahy Scale: Fair     Standing balance support: Single extremity supported;Bilateral upper extremity supported;During functional activity Standing balance-Leahy Scale: Poor Standing balance comment: external support for balance as well as bil UE support needed.                            Cognition Arousal/Alertness: Awake/alert Behavior During Therapy: WFL for tasks assessed/performed Overall Cognitive Status: Within Functional Limits for tasks assessed  General Comments: some slow processing      Exercises Other Exercises Other Exercises: Toe raises x 10 Other Exercises: Side stepping x 10 Other Exercises: Pt could not perform braiding or could not do tandem stance as he cannot maintain balance.     General Comments        Pertinent  Vitals/Pain Pain Assessment: Faces Faces Pain Scale: Hurts little more Pain Location: back and LEs Pain Descriptors / Indicators: Aching;Grimacing;Guarding Pain Intervention(s): Limited activity within patient's tolerance;Monitored during session;Repositioned    Home Living                      Prior Function            PT Goals (current goals can now be found in the care plan section) Acute Rehab PT Goals Patient Stated Goal: stop falling Progress towards PT goals: Progressing toward goals    Frequency    Min 3X/week      PT Plan Current plan remains appropriate    Co-evaluation              AM-PAC PT "6 Clicks" Mobility   Outcome Measure  Help needed turning from your back to your side while in a flat bed without using bedrails?: A Little Help needed moving from lying on your back to sitting on the side of a flat bed without using bedrails?: A Little Help needed moving to and from a bed to a chair (including a wheelchair)?: A Little Help needed standing up from a chair using your arms (e.g., wheelchair or bedside chair)?: A Little Help needed to walk in hospital room?: A Little Help needed climbing 3-5 steps with a railing? : A Lot 6 Click Score: 17    End of Session Equipment Utilized During Treatment: Gait belt Activity Tolerance: Patient limited by fatigue Patient left: in chair;with call bell/phone within reach;with chair alarm set Nurse Communication: Mobility status PT Visit Diagnosis: Muscle weakness (generalized) (M62.81);History of falling (Z91.81);Pain Pain - part of body: (back and LES)     Time: 3893-7342 PT Time Calculation (min) (ACUTE ONLY): 27 min  Charges:  $Gait Training: 8-22 mins $Therapeutic Exercise: 8-22 mins                     Jahzaria Vary,PT Acute Rehabilitation Services Pager:  [phone]  Office:  [phone]     Berline Lopes 05/22/2018, 4:03 PM

## 2018-05-23 DIAGNOSIS — N183 Chronic kidney disease, stage 3 (moderate): Secondary | ICD-10-CM | POA: Diagnosis not present

## 2018-05-23 DIAGNOSIS — G8194 Hemiplegia, unspecified affecting left nondominant side: Secondary | ICD-10-CM | POA: Diagnosis not present

## 2018-05-23 DIAGNOSIS — I10 Essential (primary) hypertension: Secondary | ICD-10-CM | POA: Diagnosis not present

## 2018-05-23 DIAGNOSIS — Z95810 Presence of automatic (implantable) cardiac defibrillator: Secondary | ICD-10-CM | POA: Diagnosis not present

## 2018-05-23 DIAGNOSIS — R531 Weakness: Secondary | ICD-10-CM | POA: Diagnosis not present

## 2018-05-23 DIAGNOSIS — S066X0D Traumatic subarachnoid hemorrhage without loss of consciousness, subsequent encounter: Secondary | ICD-10-CM | POA: Diagnosis not present

## 2018-05-23 DIAGNOSIS — E1140 Type 2 diabetes mellitus with diabetic neuropathy, unspecified: Secondary | ICD-10-CM | POA: Diagnosis not present

## 2018-05-23 DIAGNOSIS — R296 Repeated falls: Secondary | ICD-10-CM | POA: Diagnosis not present

## 2018-05-23 DIAGNOSIS — R55 Syncope and collapse: Secondary | ICD-10-CM | POA: Diagnosis not present

## 2018-05-23 DIAGNOSIS — Z743 Need for continuous supervision: Secondary | ICD-10-CM | POA: Diagnosis not present

## 2018-05-23 DIAGNOSIS — I130 Hypertensive heart and chronic kidney disease with heart failure and stage 1 through stage 4 chronic kidney disease, or unspecified chronic kidney disease: Secondary | ICD-10-CM | POA: Diagnosis not present

## 2018-05-23 DIAGNOSIS — E785 Hyperlipidemia, unspecified: Secondary | ICD-10-CM | POA: Diagnosis not present

## 2018-05-23 DIAGNOSIS — I4891 Unspecified atrial fibrillation: Secondary | ICD-10-CM | POA: Diagnosis not present

## 2018-05-23 DIAGNOSIS — G4089 Other seizures: Secondary | ICD-10-CM | POA: Diagnosis not present

## 2018-05-23 DIAGNOSIS — E1165 Type 2 diabetes mellitus with hyperglycemia: Secondary | ICD-10-CM | POA: Diagnosis not present

## 2018-05-23 DIAGNOSIS — E1122 Type 2 diabetes mellitus with diabetic chronic kidney disease: Secondary | ICD-10-CM | POA: Diagnosis not present

## 2018-05-23 DIAGNOSIS — G4733 Obstructive sleep apnea (adult) (pediatric): Secondary | ICD-10-CM | POA: Diagnosis not present

## 2018-05-23 DIAGNOSIS — I639 Cerebral infarction, unspecified: Secondary | ICD-10-CM | POA: Diagnosis not present

## 2018-05-23 DIAGNOSIS — N189 Chronic kidney disease, unspecified: Secondary | ICD-10-CM | POA: Diagnosis not present

## 2018-05-23 DIAGNOSIS — I480 Paroxysmal atrial fibrillation: Secondary | ICD-10-CM | POA: Diagnosis not present

## 2018-05-23 DIAGNOSIS — I5022 Chronic systolic (congestive) heart failure: Secondary | ICD-10-CM | POA: Diagnosis not present

## 2018-05-23 DIAGNOSIS — I509 Heart failure, unspecified: Secondary | ICD-10-CM | POA: Diagnosis not present

## 2018-05-23 DIAGNOSIS — Z9181 History of falling: Secondary | ICD-10-CM | POA: Diagnosis not present

## 2018-05-23 DIAGNOSIS — R569 Unspecified convulsions: Secondary | ICD-10-CM | POA: Diagnosis not present

## 2018-05-23 DIAGNOSIS — I609 Nontraumatic subarachnoid hemorrhage, unspecified: Secondary | ICD-10-CM | POA: Diagnosis not present

## 2018-05-23 DIAGNOSIS — R279 Unspecified lack of coordination: Secondary | ICD-10-CM | POA: Diagnosis not present

## 2018-05-23 DIAGNOSIS — R202 Paresthesia of skin: Secondary | ICD-10-CM | POA: Diagnosis not present

## 2018-05-23 DIAGNOSIS — I5023 Acute on chronic systolic (congestive) heart failure: Secondary | ICD-10-CM | POA: Diagnosis not present

## 2018-05-23 LAB — PROTIME-INR
INR: 1.04
Prothrombin Time: 13.5 seconds (ref 11.4–15.2)

## 2018-05-23 LAB — CBC
HCT: 34 % — ABNORMAL LOW (ref 39.0–52.0)
Hemoglobin: 10.4 g/dL — ABNORMAL LOW (ref 13.0–17.0)
MCH: 26.9 pg (ref 26.0–34.0)
MCHC: 30.6 g/dL (ref 30.0–36.0)
MCV: 87.9 fL (ref 80.0–100.0)
Platelets: 170 10*3/uL (ref 150–400)
RBC: 3.87 MIL/uL — ABNORMAL LOW (ref 4.22–5.81)
RDW: 12.6 % (ref 11.5–15.5)
WBC: 8.8 10*3/uL (ref 4.0–10.5)
nRBC: 0 % (ref 0.0–0.2)

## 2018-05-23 LAB — BASIC METABOLIC PANEL
Anion gap: 7 (ref 5–15)
BUN: 40 mg/dL — ABNORMAL HIGH (ref 8–23)
CALCIUM: 9.1 mg/dL (ref 8.9–10.3)
CO2: 27 mmol/L (ref 22–32)
Chloride: 104 mmol/L (ref 98–111)
Creatinine, Ser: 1.55 mg/dL — ABNORMAL HIGH (ref 0.61–1.24)
GFR calc Af Amer: 49 mL/min — ABNORMAL LOW (ref >60)
GFR calc non Af Amer: 43 mL/min — ABNORMAL LOW (ref >60)
Glucose, Bld: 226 mg/dL — ABNORMAL HIGH (ref 70–99)
Potassium: 4.3 mmol/L (ref 3.5–5.1)
SODIUM: 138 mmol/L (ref 135–145)

## 2018-05-23 LAB — GLUCOSE, CAPILLARY
Glucose-Capillary: 175 mg/dL — ABNORMAL HIGH (ref 70–99)
Glucose-Capillary: 231 mg/dL — ABNORMAL HIGH (ref 70–99)

## 2018-05-23 MED ORDER — SACUBITRIL-VALSARTAN 49-51 MG PO TABS: 1.0000 | ORAL_TABLET | Freq: Two times a day (BID) | ORAL | 6 refills | Status: DC

## 2018-05-23 MED ORDER — FUROSEMIDE 40 MG PO TABS: ORAL_TABLET | ORAL | 6 refills | Status: DC

## 2018-05-23 NOTE — Plan of Care (Signed)
  Problem: Education: Goal: Knowledge of General Education information will improve Description Including pain rating scale, medication(s)/side effects and non-pharmacologic comfort measures 05/23/2018 1431 by Jeanella Flattery, RN Outcome: Adequate for Discharge 05/23/2018 1430 by Jeanella Flattery, RN Outcome: Adequate for Discharge

## 2018-05-23 NOTE — Clinical Social Work Placement (Signed)
   CLINICAL SOCIAL WORK PLACEMENT  NOTE  Date:  05/23/2018  Patient Details  Name: Noah [name] MRN: [id number] Date of Birth: [birth year]  Clinical Social Work is seeking post-discharge placement for this patient at the Skilled  Nursing Facility level of care (*CSW will initial, date and re-position this form in  chart as items are completed):      Patient/family provided with [facility] Clinical Social Work Department's list of facilities offering this level of care within the geographic area requested by the patient (or if unable, by the patient's family).      Patient/family informed of their freedom to choose among providers that offer the needed level of care, that participate in Medicare, Medicaid or managed care program needed by the patient, have an available bed and are willing to accept the patient.      Patient/family informed of [facility]'s ownership interest in [facility] and [facility], as well as of the fact that they are under no obligation to receive care at these facilities.  PASRR submitted to EDS on 05/19/18     PASRR number received on 05/19/18     Existing PASRR number confirmed on       FL2 transmitted to all facilities in geographic area requested by pt/family on 05/19/18     FL2 transmitted to all facilities within larger geographic area on       Patient informed that his/her managed care company has contracts with or will negotiate with certain facilities, including the following:        Yes   Patient/family informed of bed offers received.  Patient chooses bed at [facility])     Physician recommends and patient chooses bed at      Patient to be transferred to [facility]) on 05/23/18.  Patient to be transferred to facility by [facility]     Patient family notified on 05/23/18 of transfer.  Name of family member notified:        PHYSICIAN Please prepare prescriptions     Additional Comment:     _______________________________________________ Margarito Liner, LCSW 05/23/2018, 2:34 PM

## 2018-05-23 NOTE — Progress Notes (Signed)
Occupational Therapy Treatment Patient Details Name: Noah [name] MRN: [id number] DOB: [birth year] Today's Date: 05/23/2018    History of present illness 78 y.o. M with CHF EF 30%, HTN, CKD III baseline Cr 1.3, DM, CAD s/p remote PCI, OSA on CPAP and PAF on warfarin who presents with progressive recurrent falls and now [facility].   OT comments  PATIENT STATES HE STILL PLANS ON GOING TO ST SNF FOR REHAB SECONDARY TO FEAR OF FALLING AT [place]. PATIENT REQUIRED ASSIST WITH LE DRESSING WITH USE OF AE. PATIENT WAS MIN GUARD ASSIST WITH AMB IN ROOM TO BATHROOM AND STANDING AT SINK FOR GROOMING. ACUTE OT TO FOLLOW.   Follow Up Recommendations       Equipment Recommendations       Recommendations for Other Services      Precautions / Restrictions Precautions Precautions: Fall Precaution Comments: h/o falls Restrictions Weight Bearing Restrictions: No       Mobility Bed Mobility                  Transfers       Sit to Stand: Min guard              Balance                                           ADL either performed or assessed with clinical judgement   ADL       Grooming: Wash/dry hands;Wash/dry face;Oral care;Standing               Lower Body Dressing: Minimal assistance;With adaptive equipment;Sit to/from stand   Toilet Transfer: Min guard;Ambulation;Comfort height toilet;Grab bars;RW   Toileting- Clothing Manipulation and Hygiene: Minimal assistance       Functional mobility during ADLs: Min guard General ADL Comments: PATIENT WAS ABLE TO USE AE FOR LE DRESSING WITH CUES AND ASSIST TO USE REACHER CORRECTLY TO DOFF SOCKS. PATIENT WAS ABLE TO STAND AT SINK FOR 4 MIN FOR GROOMING TASKS.      Vision       Perception     Praxis      Cognition Arousal/Alertness: Awake/alert Behavior During Therapy: WFL for tasks assessed/performed Overall Cognitive Status: Within Functional Limits for tasks assessed                                          Exercises     Shoulder Instructions       General Comments      Pertinent Vitals/ Pain       Pain Assessment: No/denies pain  Home Living                                          Prior Functioning/Environment              Frequency           Progress Toward Goals  OT Goals(current goals can now be found in the care plan section)  Progress towards OT goals: Progressing toward goals  Acute Rehab OT Goals Patient Stated Goal: TO GET STRONGER  Plan Discharge plan remains appropriate    Co-evaluation  AM-PAC OT "6 Clicks" Daily Activity     Outcome Measure   Help from another person eating meals?: None Help from another person taking care of personal grooming?: A Little Help from another person toileting, which includes using toliet, bedpan, or urinal?: A Little Help from another person bathing (including washing, rinsing, drying)?: A Little Help from another person to put on and taking off regular upper body clothing?: A Little Help from another person to put on and taking off regular lower body clothing?: A Little 6 Click Score: 19    End of Session Equipment Utilized During Treatment: Gait belt;Rolling walker  OT Visit Diagnosis: Unsteadiness on feet (R26.81);Other abnormalities of gait and mobility (R26.89);Repeated falls (R29.6);History of falling (Z91.81);Muscle weakness (generalized) (M62.81);Pain   Activity Tolerance Patient tolerated treatment well   Patient Left in chair;with call bell/phone within reach;with chair alarm set   Nurse Communication ([facility]ER[facility]Y)        Time: 6759-1638 OT Time Calculation (min): 28 min  Charges: OT General Charges $OT Visit: 1 Visit OT Treatments $Self Care/Home Management : 23-37 mins  6 CLICKS   Estoria Geary 05/23/2018, 10:08 AM

## 2018-05-23 NOTE — Plan of Care (Signed)
  Problem: Education: Goal: Knowledge of patient specific risk factors addressed and post discharge goals established will improve Outcome: Adequate for Discharge   Problem: Education: Goal: Knowledge of General Education information will improve Description Including pain rating scale, medication(s)/side effects and non-pharmacologic comfort measures Outcome: Adequate for Discharge

## 2018-05-23 NOTE — Progress Notes (Signed)
Patient ready for discharge. [facility] at bedside.

## 2018-05-23 NOTE — Clinical Social Work Note (Addendum)
CSW facilitated patient discharge including contacting patient family and facility to confirm patient discharge plans. Clinical information faxed to facility and family agreeable with plan. CSW arranged ambulance transport via [facility] to [facility] at 3:30 pm. RN to call report prior to discharge [phone]).  CSW will sign off for now as social work intervention is no longer needed. Please consult [place] again if new needs arise.  Charlynn Court, CSW [phone]

## 2018-05-23 NOTE — Progress Notes (Signed)
RT set pt's CPAP unit up in room, pt stated he wasn't ready to go on for the night. Pt also stated he felt comfortable enough to place self on when ready for bed. RT made pt aware to call if he needed help. RT will continue to monitor as needed.

## 2018-05-23 NOTE — Care Management Important Message (Signed)
Important Message  Patient Details  Name: Noah [name] MRN: [id number] Date of Birth: 04-01-1941   Medicare Important Message Given:  Yes    [name] P Larsen Zettel 05/23/2018, 5:00 PM

## 2018-05-23 NOTE — Discharge Summary (Signed)
Physician Discharge Summary  Noah [name] BUY:[id number] DOB: [birth year] DOA: 05/17/2018  PCP: Estanislado Pandy, MD  Admit date: 05/17/2018 Discharge date: 05/23/2018  Admitted From: Home  Disposition:  SNF   Recommendations for Outpatient Follow-up:  1. Restart Lasix on 2/5; check creatinine on Friday, 2/7 2. If renal function stable on Friday BMP, restart Entresto 3. Restart warfarin on 2/5 with 2.5 mg daily except 5 mg on Tuesday 4. Check INR on Friday 2/7 and then 2/12 and adjust dose as needed 5. Follow up with PCP in 1-2 weeks after discharge from SNF      Home Health: None  Equipment/Devices: Rolling walker 5" wheels, 3 in 1; otherwise TBD at SNF  Discharge Condition: Fair  CODE STATUS: FULL Diet recommendation: Diabetic, cardiac, low sodium  Brief/Interim Summary: Noah [name] is a 78 y.o. M with CHF EF 30%, HTN, CKD III baseline Cr 1.3, DM, CAD s/p remote PCI, OSA on CPAP and PAF on warfarin who presented with progressive recurrent falls and now [facility].  Two separate presenting complaints:  1. slowly progressive weakness, fatigue, and falls over 1-2 months 2. Syncope x2 -- several days prior to admission, patient got up, got dressed, hadn't had breakfast or meds yet, was walking in driveway with dog and passed out without warning, striking his head.  Was groggy afterwards, but no tongue biting or witnessed seizures.  Returned to normal, did not seek care; then second time, day of admission, was finishing urinating in bathroom, got dizzy and nearly passed out.    In ER, found to be in normal sinus rhythm, CT head showed SAH, small      PRINCIPAL HOSPITAL DIAGNOSIS:  Progressive weakness, falls complicated by new subarachnoid hemorrhage    Discharge Diagnoses:   Recurrent falls Progressive weakness Imbalance The patient has had proggressive weakness and recurrent falls that are independent of his syncope.  They do appear to correlate with about a 10 to 15 pound weight  gain (adipose not water weight) over the last several months as documented by his 2 cardiologists and his family.  Ambien and Flexeril are also associated with falls.  CT head here showed only small SAH as outline below.  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      Subarachnoid hemorrhage This likely happened some days before admission, did not worsen with his home warfarin.  His INR was reversed at the OSH with vitamin K and K-centra.  Here, he was evaluated by Neurosurgery, no surgical intervention needed.  Low risk of rebleeding.  Warfarin Noah [name] 6 days.  Will restart on discharge.     Syncope Syncope and pre-syncope episodes prior to admission.  ECG unremarkable on admission.  Pacer interrogated, no VT/VF, no shocks, no significant AMS during the interval when he syncopized (the episode of syncope while walking the dog or post micturition near-syncope).  Echocardiogram shows improved ejection fraction since September, no new regional wall motion abnormalities or valvular disease.  He was not orthostatic.  Doubt seizure.  Etiology somewhat unclear.  Acute on chronic systolic CHF On arrival to [facility], patient had worse than usual dyspnea and orthopnea and worse than usual leg swelling.  Treated with IV lasix, swelling resolved, but Cr worsened.  Echo obtained, showed EF 40%, improved from previous.  No significant valvular disease.   Diuretics and ACE-ARNI Noah [name].  Creatinine improved.  Swelling no change.  AKI on CKD III Creatinine worsened to 2.0 mg/dL peak on diuretics.  Improved to 1.5 mg/dL on day of dsicharge.  Will restart Lasix now.  Repeat Cr in 3 days.  Plan to restart Entresto if repeat Cr stable to improving.    Diabetes  Hypertension Coronary disease secondary prevention Blood pressure low normal  OSA  Atrial fibrillation,  paroxysmal CHA2DS2-Vasc 6.  Warfarin restarted at discharge.            Discharge Instructions  Discharge Instructions    Discharge instructions   Complete by:  As directed    From Dr. Maryfrances Bunnell: You were admitted to the hospital with a fall and passing out episode.  We were able to determine from your ICD that your passing out episode was not due to an abnormal rhythm of the heart.  Your ultrasound of the heart, likewise, was unchanged from previous.  You did have some swelling and shortness of breath, and so we increased your lasix for 2 days.  This caused you to be dehydrated and your kidney function to worsen, and so your lasix was Noah [name] then for a few days. Your kidney function is getting better though, so you should resume your Lasix tomorrow, as usual 80 in the morning and 40 in the afternoon  Continue to hold your Noah [name] however, until you have a kidney function recheck.  Recheck kidney function on Friday 2/7.  If the kidney function is stable or improved, restart the Entresto.   For your diabetes, resume your home medicines Check blood sugars daily    For the bruising/contusion on your brain: Adhere to the concussion recommendations from Dr. [name] We have Noah [name] your warfarin now for 5 days, so you can restart it tomorrow Resume your previous dosing (2.5 mg every day but 5 mg on Tuesdays) Have the skilled nursing center check your INR on Friday 2/7   Call your primary care doctor for a follow up appointment within 7-10 days from leaving the rehab center.  Given that your kidney function worsened for a few days when we increased your lasix, I do not think your symptoms of fatigue and imbalance are related to congestive heart failure, and so I think you can wait until your next scheduled appointment in a few months, to see Dr. [name], your heart specialist.  Instead, I wonder if some of the sedating medicines you take, like the Ambien or Zaleplon are causing  you to be sleepy and falling.  You should avoid medicines like this.  Melatonin is a reasonable alternative.   Increase activity slowly   Complete by:  As directed      Allergies as of 05/23/2018      Reactions   Codeine Other (See Comments)   constipation   Erythromycin-sulfisoxazole Other (See Comments)   Causes infection in throat and eyes      Medication List    STOP taking these medications   zaleplon 5 MG capsule Commonly known as:  SONATA   zolpidem 10 MG tablet Commonly known as:  AMBIEN     TAKE these medications   acetaminophen 650 MG CR tablet Commonly known as:  TYLENOL Take 1,300 mg by mouth at bedtime.   aspirin EC 81 MG tablet Take 1 tablet (81 mg total) by mouth daily.   cyclobenzaprine 10 MG tablet Commonly known as:  FLEXERIL Take 10 mg by mouth 3 (three) times daily as needed for spasms.   finasteride 5 MG tablet Commonly known as:  PROSCAR  Take 5 mg by mouth every evening.   Fish Oil 1000 MG Caps Take 1,000 mg by mouth 2 (two) times daily.   FLUoxetine 20 MG capsule Commonly known as:  PROZAC Take 20 mg by mouth every evening.   furosemide 40 MG tablet Commonly known as:  LASIX Take 2 tabs (80mg ) by mouth every morning & 1 tab (40mg ) every afternoon Start taking on:  May 24, 2018   gabapentin 100 MG capsule Commonly known as:  NEURONTIN Take 100 mg by mouth 2 (two) times daily.   glipiZIDE 10 MG 24 hr tablet Commonly known as:  GLUCOTROL XL Take 10 mg by mouth 2 (two) times daily.   glucose blood test strip Commonly known as:  ONE TOUCH ULTRA TEST Check your sugars twice a day   Insulin Pen Needle 30G X 8 MM Misc Commonly known as:  NOVOFINE Inject 10 each into the skin as needed.   isosorbide mononitrate 30 MG 24 hr tablet Commonly known as:  IMDUR Take 30 mg by mouth daily.   metFORMIN 500 MG 24 hr tablet Commonly known as:  GLUCOPHAGE-XR Take 1,500 mg by mouth daily.   metoprolol 200 MG 24 hr tablet Commonly known  as:  TOPROL-XL Take 100 mg by mouth 2 (two) times daily.   multivitamin capsule Take 1 capsule by mouth daily.   omeprazole 20 MG capsule Commonly known as:  PRILOSEC Take 20 mg by mouth daily.   ranitidine 150 MG tablet Commonly known as:  ZANTAC Take 150 mg by mouth at bedtime.   sacubitril-valsartan 49-51 MG Commonly known as:  ENTRESTO Take 1 tablet by mouth 2 (two) times daily. Start taking on:  May 29, 2018 What changed:  These instructions start on May 29, 2018. If you are unsure what to do until then, ask your doctor or other care provider.   simvastatin 80 MG tablet Commonly known as:  ZOCOR TAKE 80 MG (1 TABLET) DAILY. What changed:    how much to take  how to take this  when to take this   SOLIQUA 100-33 UNT-MCG/ML Sopn Generic drug:  Insulin Glargine-Lixisenatide Inject 44 Units into the skin every evening.   tamsulosin 0.4 MG Caps capsule Commonly known as:  FLOMAX Take 0.8 mg by mouth every evening.   warfarin 5 MG tablet Commonly known as:  COUMADIN Take as directed. If you are unsure how to take this medication, talk to your nurse or doctor. Original instructions:  Take 1/2 tablet daily except 1 tablet on Tuesdays, Thursdays and Saturdays What changed:  additional instructions       Contact information for follow-up providers    Sasser, Clarene CritchleyPaul W, MD. Go on 05/29/2018.   Specialty:  Family Medicine Why:  @[email or web address] :[email or web address] Contact information: [address] [place] [address] [phone]            Contact information for after-discharge care    Destination    [facility] Preferred SNF .   Service:  Skilled Nursing Contact information: [address] [place] [id number] [phone]                 Allergies  Allergen Reactions  . Codeine Other (See Comments)    constipation  . Erythromycin-Sulfisoxazole Other (See Comments)    Causes infection in throat and eyes     Consultations:  Neurosurgery   Procedures/Studies: Ct Head Wo Contrast  Result Date: 05/17/2018 CLINICAL DATA:  Head trauma.  Subarachnoid hemorrhage. EXAM: CT HEAD WITHOUT CONTRAST CT CERVICAL SPINE WITHOUT CONTRAST TECHNIQUE: Multidetector CT imaging of the head and cervical spine was performed following the standard protocol without intravenous contrast. Multiplanar CT image reconstructions of the cervical spine were also generated. COMPARISON:  05/17/2018 FINDINGS: CT HEAD FINDINGS Brain: Again noted is subarachnoid hemorrhage over the high right frontal lobe, stable. No new areas of hemorrhage. Diffuse cerebral atrophy and chronic small vessel disease. No mass effect or midline shift. Vascular: No hyperdense vessel or unexpected calcification. Skull: No acute calvarial abnormality. Sinuses/Orbits: Visualized paranasal sinuses and mastoids clear. Orbital soft tissues unremarkable. Other: None CT CERVICAL SPINE FINDINGS Alignment: No subluxation Skull base and vertebrae: No acute fracture. No primary bone lesion or focal pathologic process. Soft tissues and spinal canal: No prevertebral fluid or swelling. No visible canal hematoma. Disc levels: Diffuse degenerative disc disease with disc space narrowing and spurring, most pronounced at C5-6 and C6-7. Upper chest: No acute findings Other: None IMPRESSION: Stable small subarachnoid hemorrhage over the high right frontal region. 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 No acute bony abnormality. Electronically Signed   By: Charlett Nose M.D.   On: 05/17/2018 23:10   Ct Cervical Spine Wo Contrast  Result Date: 05/17/2018 CLINICAL DATA:  Head trauma.  Subarachnoid hemorrhage. EXAM: CT HEAD WITHOUT CONTRAST CT CERVICAL SPINE WITHOUT CONTRAST TECHNIQUE: Multidetector CT imaging of the head and cervical spine was performed following the standard protocol without intravenous contrast. Multiplanar CT image reconstructions of the cervical  spine were also generated. COMPARISON:  05/17/2018 FINDINGS: CT HEAD FINDINGS Brain: Again noted is subarachnoid hemorrhage over the high right frontal lobe, stable. No new areas of hemorrhage. Diffuse cerebral atrophy and chronic small vessel disease. No mass effect or midline shift. Vascular: No hyperdense vessel or unexpected calcification. Skull: No acute calvarial abnormality. Sinuses/Orbits: Visualized paranasal sinuses and mastoids clear. Orbital soft tissues unremarkable. Other: None CT CERVICAL SPINE FINDINGS Alignment: No subluxation Skull base and vertebrae: No acute fracture. No primary bone lesion or focal pathologic process. Soft tissues and spinal canal: No prevertebral fluid or swelling. No visible canal hematoma. Disc levels: Diffuse degenerative disc disease with disc space narrowing and spurring, most pronounced at C5-6 and C6-7. Upper chest: No acute findings Other: None IMPRESSION: Stable small subarachnoid hemorrhage over the high right frontal region. 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 No acute bony abnormality. Electronically Signed   By: Charlett Nose M.D.   On: 05/17/2018 23:10   Vas US Carotid  Result Date: 05/19/2018 Carotid Arterial Duplex Study Performing Technologist: Sherren Kerns RVS  Examination Guidelines: A complete evaluation includes B-mode imaging, spectral Doppler, color Doppler, and power Doppler as needed of all accessible portions of each vessel. Bilateral testing is considered an integral part of a complete examination. Limited examinations for reoccurring indications may be performed as noted.  Right Carotid Findings: +----------+--------+--------+--------+--------+--------+           PSV cm/sEDV cm/sStenosisDescribeComments +----------+--------+--------+--------+--------+--------+ CCA Prox  79      21                               +----------+--------+--------+--------+--------+--------+ CCA Distal73      16                                +----------+--------+--------+--------+--------+--------+ ICA Prox  76      16                               +----------+--------+--------+--------+--------+--------+  ICA Distal40      13                               +----------+--------+--------+--------+--------+--------+ ECA       123     16                               +----------+--------+--------+--------+--------+--------+ +----------+--------+-------+--------+-------------------+           PSV cm/sEDV cmsDescribeArm Pressure (mmHG) +----------+--------+-------+--------+-------------------+ [id number]                                         +----------+--------+-------+--------+-------------------+ +---------+--------+--+--------+--+ VertebralPSV cm/s42EDV cm/s12 +---------+--------+--+--------+--+  Left Carotid Findings: +----------+--------+--------+--------+--------+--------+           PSV cm/sEDV cm/sStenosisDescribeComments +----------+--------+--------+--------+--------+--------+ CCA Prox  78      23                               +----------+--------+--------+--------+--------+--------+ CCA Distal94      20                               +----------+--------+--------+--------+--------+--------+ ICA Prox  76      18                               +----------+--------+--------+--------+--------+--------+ ICA Distal76      19                               +----------+--------+--------+--------+--------+--------+ ECA       121     17                               +----------+--------+--------+--------+--------+--------+ +----------+--------+--------+--------+-------------------+ SubclavianPSV cm/sEDV cm/sDescribeArm Pressure (mmHG) +----------+--------+--------+--------+-------------------+           83                                          +----------+--------+--------+--------+-------------------+ +---------+--------+--+--------+--+  VertebralPSV cm/s77EDV cm/s19 +---------+--------+--+--------+--+  Summary:   *See table(s) above for measurements and observations.  Electronically signed by Lemar LivingsBrandon Cain MD on 05/19/2018 at 12:37:23 AM.   Final    Echocardiogram  IMPRESSIONS    1. The left ventricle has mild-moderately reduced systolic function of 40-45%. The cavity size is normal. There is moderate left ventricular wall thickness. Echo evidence of unable to assess diastolic filling patterns.  2. Hypokinesis if the mid-apical inferior, inferoseptal, anteroseptal and apical myocardium.  3. Normal left atrial size.  4. Normal right atrial size.  5. Normal tricuspid valve.  6. The inferior vena cava was dilated in size with >50% respiratory variablity.  7. No atrial level shunt detected by color flow Doppler.       Subjective: No chest pain, dyspnea.  No confusion, headache, vomiting.  No vision changes, focal weakness, seizures, LOC.    Discharge Exam: Vitals:   05/23/18 0823 05/23/18 1202  BP: 105/81 133/65  Pulse: 65  70  Resp: 20 20  Temp: 97.7 F (36.5 C) 97.9 F (36.6 C)  SpO2: 99% 98%   Vitals:   05/22/18 2200 05/23/18 0534 05/23/18 0823 05/23/18 1202  BP: (!) 153/88 (!) 140/91 105/81 133/65  Pulse: 77 70 65 70  Resp:  20 20 20   Temp:  (!) 97.5 F (36.4 C) 97.7 F (36.5 C) 97.9 F (36.6 C)  TempSrc:  Oral Oral Oral  SpO2:  95% 99% 98%  Weight:  127.7 kg    Height:        General: Pt is alert, awake, not in acute distress, sitting in recliner Cardiovascular: RRR, nl S1-S2, soft ejection murmur.   No right LE edema, 1+ on left.   Respiratory: Normal respiratory rate and rhythm.  CTAB without wheezes. Crackles at right base noted. Abdominal: Abdomen soft and non-tender.  No distension or HSM.   Neuro/Psych: Strength symmetric in upper and lower extremities.  Judgment and insight appear normal.   The results of significant diagnostics from this hospitalization (including imaging,  microbiology, ancillary and laboratory) are listed below for reference.     Microbiology: No results found for this or any previous visit (from the past 240 hour(s)).   Labs: BNP (last 3 results) No results for input(s): BNP in the last 8760 hours. Basic Metabolic Panel: Recent Labs  Lab 05/19/18 0607 05/20/18 0547 05/21/18 0459 05/22/18 0335 05/23/18 0512  NA 139 139 135 139 138  K 3.6 4.5 4.3 4.2 4.3  CL 101 102 98 102 104  CO2 28 26 27 25 27   GLUCOSE 154* 217* 281* 259* 226*  BUN 26* 39* 47* 46* 40*  CREATININE 1.38* 1.78* 2.05* 1.63* 1.55*  CALCIUM 8.9 9.1 9.0 8.9 9.1   Liver Function Tests: Recent Labs  Lab 05/17/18 2228  AST 16  ALT 18  ALKPHOS 48  BILITOT 0.7  PROT 5.8*  ALBUMIN 2.7*   CBC: Recent Labs  Lab 05/19/18 0607 05/20/18 0547 05/21/18 0459 05/22/18 0335 05/23/18 0512  WBC 9.5 11.2* 10.1 9.7 8.8  HGB 11.4* 11.4* 11.5* 10.9* 10.4*  HCT 35.9* 36.2* 36.4* 35.8* 34.0*  MCV 87.8 88.3 88.6 88.2 87.9  PLT 165 174 171 159 170   CBG: Recent Labs  Lab 05/22/18 1126 05/22/18 1623 05/22/18 2103 05/23/18 0735 05/23/18 1155  GLUCAP 293* 246* 268* 175* 231*     Time coordinating discharge: 25 minutes       SIGNED:   Alberteen Sam, MD  [facility] 05/23/2018, 1:40 PM

## 2018-05-23 NOTE — Clinical Social Work Note (Addendum)
Insurance authorization approved. MD will discharge patient today. Patient aware and will have family bring his cpap machine to the facility before bedtime.  Charlynn Court, CSW [phone]  1:55 pm Sent discharge summary to SNF and left voicemail for admissions coordinator. Will set up PTAR when she calls back.  Charlynn Court, CSW [phone]

## 2018-05-23 NOTE — Consult Note (Signed)
   [facility] CM Inpatient Consult   05/23/2018  Noah [name] 01/25/1941 [id number]   Patient screened for medium  risk score for unplanned readmission and hospitalizations to check if potential [facility] Care Management services are needed . Patient is for post hospital transition to SNF for ST rehab at [facility].  No current [facility] follow up needs. Please place a [facility] Care Management consult or for questions contact:   Charlesetta Shanks, RN BSN CCM [facility]  [phone] business mobile phone Toll free office [phone]

## 2018-05-24 DIAGNOSIS — I509 Heart failure, unspecified: Secondary | ICD-10-CM | POA: Diagnosis not present

## 2018-05-24 DIAGNOSIS — N189 Chronic kidney disease, unspecified: Secondary | ICD-10-CM | POA: Diagnosis not present

## 2018-05-24 DIAGNOSIS — I609 Nontraumatic subarachnoid hemorrhage, unspecified: Secondary | ICD-10-CM | POA: Diagnosis not present

## 2018-05-24 DIAGNOSIS — R296 Repeated falls: Secondary | ICD-10-CM | POA: Diagnosis not present

## 2018-05-26 DIAGNOSIS — I517 Cardiomegaly: Secondary | ICD-10-CM | POA: Diagnosis not present

## 2018-05-26 DIAGNOSIS — G8194 Hemiplegia, unspecified affecting left nondominant side: Secondary | ICD-10-CM | POA: Diagnosis not present

## 2018-05-26 DIAGNOSIS — I6502 Occlusion and stenosis of left vertebral artery: Secondary | ICD-10-CM | POA: Diagnosis not present

## 2018-05-26 DIAGNOSIS — E785 Hyperlipidemia, unspecified: Secondary | ICD-10-CM | POA: Diagnosis not present

## 2018-05-26 DIAGNOSIS — R531 Weakness: Secondary | ICD-10-CM | POA: Diagnosis not present

## 2018-05-26 DIAGNOSIS — Z95810 Presence of automatic (implantable) cardiac defibrillator: Secondary | ICD-10-CM | POA: Diagnosis not present

## 2018-05-26 DIAGNOSIS — G4089 Other seizures: Secondary | ICD-10-CM | POA: Diagnosis not present

## 2018-05-26 DIAGNOSIS — E1122 Type 2 diabetes mellitus with diabetic chronic kidney disease: Secondary | ICD-10-CM | POA: Diagnosis not present

## 2018-05-26 DIAGNOSIS — I4891 Unspecified atrial fibrillation: Secondary | ICD-10-CM | POA: Diagnosis not present

## 2018-05-26 DIAGNOSIS — I5022 Chronic systolic (congestive) heart failure: Secondary | ICD-10-CM | POA: Diagnosis not present

## 2018-05-26 DIAGNOSIS — I130 Hypertensive heart and chronic kidney disease with heart failure and stage 1 through stage 4 chronic kidney disease, or unspecified chronic kidney disease: Secondary | ICD-10-CM | POA: Diagnosis not present

## 2018-05-26 DIAGNOSIS — R202 Paresthesia of skin: Secondary | ICD-10-CM | POA: Diagnosis not present

## 2018-05-26 DIAGNOSIS — I609 Nontraumatic subarachnoid hemorrhage, unspecified: Secondary | ICD-10-CM | POA: Diagnosis not present

## 2018-05-26 DIAGNOSIS — I639 Cerebral infarction, unspecified: Secondary | ICD-10-CM | POA: Diagnosis not present

## 2018-05-26 DIAGNOSIS — N183 Chronic kidney disease, stage 3 (moderate): Secondary | ICD-10-CM | POA: Diagnosis not present

## 2018-05-27 DIAGNOSIS — I5022 Chronic systolic (congestive) heart failure: Secondary | ICD-10-CM | POA: Diagnosis not present

## 2018-05-27 DIAGNOSIS — I130 Hypertensive heart and chronic kidney disease with heart failure and stage 1 through stage 4 chronic kidney disease, or unspecified chronic kidney disease: Secondary | ICD-10-CM | POA: Diagnosis not present

## 2018-05-27 DIAGNOSIS — E785 Hyperlipidemia, unspecified: Secondary | ICD-10-CM | POA: Diagnosis not present

## 2018-05-27 DIAGNOSIS — G459 Transient cerebral ischemic attack, unspecified: Secondary | ICD-10-CM | POA: Diagnosis not present

## 2018-05-27 DIAGNOSIS — I517 Cardiomegaly: Secondary | ICD-10-CM | POA: Diagnosis not present

## 2018-05-27 DIAGNOSIS — S83242A Other tear of medial meniscus, current injury, left knee, initial encounter: Secondary | ICD-10-CM | POA: Diagnosis not present

## 2018-05-27 DIAGNOSIS — N183 Chronic kidney disease, stage 3 (moderate): Secondary | ICD-10-CM | POA: Diagnosis not present

## 2018-05-27 DIAGNOSIS — G8194 Hemiplegia, unspecified affecting left nondominant side: Secondary | ICD-10-CM | POA: Diagnosis not present

## 2018-05-27 DIAGNOSIS — E1122 Type 2 diabetes mellitus with diabetic chronic kidney disease: Secondary | ICD-10-CM | POA: Diagnosis not present

## 2018-05-27 DIAGNOSIS — I609 Nontraumatic subarachnoid hemorrhage, unspecified: Secondary | ICD-10-CM | POA: Diagnosis not present

## 2018-05-27 DIAGNOSIS — Z743 Need for continuous supervision: Secondary | ICD-10-CM | POA: Diagnosis not present

## 2018-05-27 DIAGNOSIS — Z95810 Presence of automatic (implantable) cardiac defibrillator: Secondary | ICD-10-CM | POA: Diagnosis not present

## 2018-05-27 DIAGNOSIS — N400 Enlarged prostate without lower urinary tract symptoms: Secondary | ICD-10-CM | POA: Diagnosis not present

## 2018-05-27 DIAGNOSIS — R251 Tremor, unspecified: Secondary | ICD-10-CM | POA: Diagnosis not present

## 2018-05-27 DIAGNOSIS — I4891 Unspecified atrial fibrillation: Secondary | ICD-10-CM | POA: Diagnosis not present

## 2018-05-27 DIAGNOSIS — I129 Hypertensive chronic kidney disease with stage 1 through stage 4 chronic kidney disease, or unspecified chronic kidney disease: Secondary | ICD-10-CM | POA: Diagnosis not present

## 2018-05-27 DIAGNOSIS — I6502 Occlusion and stenosis of left vertebral artery: Secondary | ICD-10-CM | POA: Diagnosis not present

## 2018-05-27 DIAGNOSIS — G4089 Other seizures: Secondary | ICD-10-CM | POA: Diagnosis not present

## 2018-05-27 DIAGNOSIS — E119 Type 2 diabetes mellitus without complications: Secondary | ICD-10-CM | POA: Diagnosis not present

## 2018-05-27 DIAGNOSIS — I2510 Atherosclerotic heart disease of native coronary artery without angina pectoris: Secondary | ICD-10-CM | POA: Diagnosis not present

## 2018-05-28 DIAGNOSIS — Z452 Encounter for adjustment and management of vascular access device: Secondary | ICD-10-CM | POA: Diagnosis not present

## 2018-05-28 DIAGNOSIS — J189 Pneumonia, unspecified organism: Secondary | ICD-10-CM | POA: Diagnosis not present

## 2018-05-28 DIAGNOSIS — I609 Nontraumatic subarachnoid hemorrhage, unspecified: Secondary | ICD-10-CM | POA: Diagnosis not present

## 2018-05-28 DIAGNOSIS — E1165 Type 2 diabetes mellitus with hyperglycemia: Secondary | ICD-10-CM | POA: Diagnosis not present

## 2018-05-28 DIAGNOSIS — I252 Old myocardial infarction: Secondary | ICD-10-CM | POA: Diagnosis not present

## 2018-05-28 DIAGNOSIS — E119 Type 2 diabetes mellitus without complications: Secondary | ICD-10-CM | POA: Diagnosis not present

## 2018-05-28 DIAGNOSIS — R69 Illness, unspecified: Secondary | ICD-10-CM | POA: Diagnosis not present

## 2018-05-28 DIAGNOSIS — R29898 Other symptoms and signs involving the musculoskeletal system: Secondary | ICD-10-CM | POA: Diagnosis not present

## 2018-05-28 DIAGNOSIS — G4089 Other seizures: Secondary | ICD-10-CM | POA: Diagnosis not present

## 2018-05-28 DIAGNOSIS — G40209 Localization-related (focal) (partial) symptomatic epilepsy and epileptic syndromes with complex partial seizures, not intractable, without status epilepticus: Secondary | ICD-10-CM | POA: Diagnosis not present

## 2018-05-28 DIAGNOSIS — I69398 Other sequelae of cerebral infarction: Secondary | ICD-10-CM | POA: Diagnosis not present

## 2018-05-28 DIAGNOSIS — Z794 Long term (current) use of insulin: Secondary | ICD-10-CM | POA: Diagnosis not present

## 2018-05-28 DIAGNOSIS — E162 Hypoglycemia, unspecified: Secondary | ICD-10-CM | POA: Diagnosis not present

## 2018-05-28 DIAGNOSIS — Z95810 Presence of automatic (implantable) cardiac defibrillator: Secondary | ICD-10-CM | POA: Diagnosis not present

## 2018-05-28 DIAGNOSIS — I5022 Chronic systolic (congestive) heart failure: Secondary | ICD-10-CM | POA: Diagnosis not present

## 2018-05-28 DIAGNOSIS — R4020 Unspecified coma: Secondary | ICD-10-CM | POA: Diagnosis not present

## 2018-05-28 DIAGNOSIS — I69354 Hemiplegia and hemiparesis following cerebral infarction affecting left non-dominant side: Secondary | ICD-10-CM | POA: Diagnosis not present

## 2018-05-28 DIAGNOSIS — Z7401 Bed confinement status: Secondary | ICD-10-CM | POA: Diagnosis not present

## 2018-05-28 DIAGNOSIS — I4891 Unspecified atrial fibrillation: Secondary | ICD-10-CM | POA: Diagnosis not present

## 2018-05-28 DIAGNOSIS — R569 Unspecified convulsions: Secondary | ICD-10-CM | POA: Diagnosis not present

## 2018-05-28 DIAGNOSIS — I509 Heart failure, unspecified: Secondary | ICD-10-CM | POA: Diagnosis not present

## 2018-05-28 DIAGNOSIS — Z9181 History of falling: Secondary | ICD-10-CM | POA: Diagnosis not present

## 2018-05-28 DIAGNOSIS — J90 Pleural effusion, not elsewhere classified: Secondary | ICD-10-CM | POA: Diagnosis not present

## 2018-05-28 DIAGNOSIS — I480 Paroxysmal atrial fibrillation: Secondary | ICD-10-CM | POA: Diagnosis not present

## 2018-05-28 DIAGNOSIS — R296 Repeated falls: Secondary | ICD-10-CM | POA: Diagnosis not present

## 2018-05-28 DIAGNOSIS — R55 Syncope and collapse: Secondary | ICD-10-CM | POA: Diagnosis not present

## 2018-05-28 DIAGNOSIS — S066X0D Traumatic subarachnoid hemorrhage without loss of consciousness, subsequent encounter: Secondary | ICD-10-CM | POA: Diagnosis not present

## 2018-05-28 DIAGNOSIS — I10 Essential (primary) hypertension: Secondary | ICD-10-CM | POA: Diagnosis not present

## 2018-05-28 DIAGNOSIS — R0602 Shortness of breath: Secondary | ICD-10-CM | POA: Diagnosis not present

## 2018-05-28 DIAGNOSIS — E11649 Type 2 diabetes mellitus with hypoglycemia without coma: Secondary | ICD-10-CM | POA: Diagnosis not present

## 2018-05-28 DIAGNOSIS — G8194 Hemiplegia, unspecified affecting left nondominant side: Secondary | ICD-10-CM | POA: Diagnosis not present

## 2018-05-28 DIAGNOSIS — R0689 Other abnormalities of breathing: Secondary | ICD-10-CM | POA: Diagnosis not present

## 2018-05-28 DIAGNOSIS — I5023 Acute on chronic systolic (congestive) heart failure: Secondary | ICD-10-CM | POA: Diagnosis not present

## 2018-05-28 DIAGNOSIS — E1140 Type 2 diabetes mellitus with diabetic neuropathy, unspecified: Secondary | ICD-10-CM | POA: Diagnosis not present

## 2018-05-28 DIAGNOSIS — R404 Transient alteration of awareness: Secondary | ICD-10-CM | POA: Diagnosis not present

## 2018-05-28 DIAGNOSIS — E161 Other hypoglycemia: Secondary | ICD-10-CM | POA: Diagnosis not present

## 2018-05-28 DIAGNOSIS — Z7901 Long term (current) use of anticoagulants: Secondary | ICD-10-CM | POA: Diagnosis not present

## 2018-05-28 DIAGNOSIS — G4733 Obstructive sleep apnea (adult) (pediatric): Secondary | ICD-10-CM | POA: Diagnosis not present

## 2018-05-28 DIAGNOSIS — K219 Gastro-esophageal reflux disease without esophagitis: Secondary | ICD-10-CM | POA: Diagnosis not present

## 2018-05-29 DIAGNOSIS — R569 Unspecified convulsions: Secondary | ICD-10-CM | POA: Diagnosis not present

## 2018-06-07 DIAGNOSIS — E162 Hypoglycemia, unspecified: Secondary | ICD-10-CM | POA: Diagnosis not present

## 2018-06-07 DIAGNOSIS — J189 Pneumonia, unspecified organism: Secondary | ICD-10-CM | POA: Diagnosis not present

## 2018-06-07 DIAGNOSIS — I10 Essential (primary) hypertension: Secondary | ICD-10-CM | POA: Diagnosis not present

## 2018-06-08 DIAGNOSIS — K219 Gastro-esophageal reflux disease without esophagitis: Secondary | ICD-10-CM | POA: Diagnosis not present

## 2018-06-08 DIAGNOSIS — Z7901 Long term (current) use of anticoagulants: Secondary | ICD-10-CM | POA: Diagnosis not present

## 2018-06-08 DIAGNOSIS — I252 Old myocardial infarction: Secondary | ICD-10-CM | POA: Diagnosis not present

## 2018-06-08 DIAGNOSIS — Z95810 Presence of automatic (implantable) cardiac defibrillator: Secondary | ICD-10-CM | POA: Diagnosis not present

## 2018-06-08 DIAGNOSIS — I509 Heart failure, unspecified: Secondary | ICD-10-CM | POA: Diagnosis not present

## 2018-06-08 DIAGNOSIS — J90 Pleural effusion, not elsewhere classified: Secondary | ICD-10-CM | POA: Diagnosis not present

## 2018-06-08 DIAGNOSIS — R69 Illness, unspecified: Secondary | ICD-10-CM | POA: Diagnosis not present

## 2018-06-08 DIAGNOSIS — E119 Type 2 diabetes mellitus without complications: Secondary | ICD-10-CM | POA: Diagnosis not present

## 2018-06-08 DIAGNOSIS — R0689 Other abnormalities of breathing: Secondary | ICD-10-CM | POA: Diagnosis not present

## 2018-06-08 DIAGNOSIS — E162 Hypoglycemia, unspecified: Secondary | ICD-10-CM | POA: Diagnosis not present

## 2018-06-08 DIAGNOSIS — E11649 Type 2 diabetes mellitus with hypoglycemia without coma: Secondary | ICD-10-CM | POA: Diagnosis not present

## 2018-06-08 DIAGNOSIS — Z794 Long term (current) use of insulin: Secondary | ICD-10-CM | POA: Diagnosis not present

## 2018-06-13 DIAGNOSIS — I509 Heart failure, unspecified: Secondary | ICD-10-CM | POA: Diagnosis not present

## 2018-06-13 DIAGNOSIS — R569 Unspecified convulsions: Secondary | ICD-10-CM | POA: Diagnosis not present

## 2018-06-13 DIAGNOSIS — E162 Hypoglycemia, unspecified: Secondary | ICD-10-CM | POA: Diagnosis not present

## 2018-06-13 DIAGNOSIS — I4891 Unspecified atrial fibrillation: Secondary | ICD-10-CM | POA: Diagnosis not present

## 2018-06-13 DIAGNOSIS — E119 Type 2 diabetes mellitus without complications: Secondary | ICD-10-CM | POA: Diagnosis not present

## 2018-06-15 DIAGNOSIS — N183 Chronic kidney disease, stage 3 (moderate): Secondary | ICD-10-CM | POA: Diagnosis not present

## 2018-06-15 DIAGNOSIS — E1140 Type 2 diabetes mellitus with diabetic neuropathy, unspecified: Secondary | ICD-10-CM | POA: Diagnosis not present

## 2018-06-15 DIAGNOSIS — G4733 Obstructive sleep apnea (adult) (pediatric): Secondary | ICD-10-CM | POA: Diagnosis not present

## 2018-06-15 DIAGNOSIS — I130 Hypertensive heart and chronic kidney disease with heart failure and stage 1 through stage 4 chronic kidney disease, or unspecified chronic kidney disease: Secondary | ICD-10-CM | POA: Diagnosis not present

## 2018-06-15 DIAGNOSIS — I2510 Atherosclerotic heart disease of native coronary artery without angina pectoris: Secondary | ICD-10-CM | POA: Diagnosis not present

## 2018-06-15 DIAGNOSIS — I480 Paroxysmal atrial fibrillation: Secondary | ICD-10-CM | POA: Diagnosis not present

## 2018-06-15 DIAGNOSIS — I255 Ischemic cardiomyopathy: Secondary | ICD-10-CM | POA: Diagnosis not present

## 2018-06-15 DIAGNOSIS — E1122 Type 2 diabetes mellitus with diabetic chronic kidney disease: Secondary | ICD-10-CM | POA: Diagnosis not present

## 2018-06-15 DIAGNOSIS — I69354 Hemiplegia and hemiparesis following cerebral infarction affecting left non-dominant side: Secondary | ICD-10-CM | POA: Diagnosis not present

## 2018-06-15 DIAGNOSIS — I5023 Acute on chronic systolic (congestive) heart failure: Secondary | ICD-10-CM | POA: Diagnosis not present

## 2018-06-15 DIAGNOSIS — S066X0D Traumatic subarachnoid hemorrhage without loss of consciousness, subsequent encounter: Secondary | ICD-10-CM | POA: Diagnosis not present

## 2018-06-15 DIAGNOSIS — I69398 Other sequelae of cerebral infarction: Secondary | ICD-10-CM | POA: Diagnosis not present

## 2018-06-15 DIAGNOSIS — R296 Repeated falls: Secondary | ICD-10-CM | POA: Diagnosis not present

## 2018-06-15 DIAGNOSIS — I5022 Chronic systolic (congestive) heart failure: Secondary | ICD-10-CM | POA: Diagnosis not present

## 2018-06-16 DIAGNOSIS — E1122 Type 2 diabetes mellitus with diabetic chronic kidney disease: Secondary | ICD-10-CM | POA: Diagnosis not present

## 2018-06-16 DIAGNOSIS — I5023 Acute on chronic systolic (congestive) heart failure: Secondary | ICD-10-CM | POA: Diagnosis not present

## 2018-06-16 DIAGNOSIS — E1165 Type 2 diabetes mellitus with hyperglycemia: Secondary | ICD-10-CM | POA: Diagnosis not present

## 2018-06-16 DIAGNOSIS — R531 Weakness: Secondary | ICD-10-CM | POA: Diagnosis not present

## 2018-06-16 DIAGNOSIS — N183 Chronic kidney disease, stage 3 (moderate): Secondary | ICD-10-CM | POA: Diagnosis not present

## 2018-06-16 DIAGNOSIS — I10 Essential (primary) hypertension: Secondary | ICD-10-CM | POA: Diagnosis not present

## 2018-06-16 DIAGNOSIS — I69354 Hemiplegia and hemiparesis following cerebral infarction affecting left non-dominant side: Secondary | ICD-10-CM | POA: Diagnosis not present

## 2018-06-16 DIAGNOSIS — R42 Dizziness and giddiness: Secondary | ICD-10-CM | POA: Diagnosis not present

## 2018-06-16 DIAGNOSIS — I5022 Chronic systolic (congestive) heart failure: Secondary | ICD-10-CM | POA: Diagnosis not present

## 2018-06-16 DIAGNOSIS — S066X0D Traumatic subarachnoid hemorrhage without loss of consciousness, subsequent encounter: Secondary | ICD-10-CM | POA: Diagnosis not present

## 2018-06-16 DIAGNOSIS — I130 Hypertensive heart and chronic kidney disease with heart failure and stage 1 through stage 4 chronic kidney disease, or unspecified chronic kidney disease: Secondary | ICD-10-CM | POA: Diagnosis not present

## 2018-06-16 DIAGNOSIS — I480 Paroxysmal atrial fibrillation: Secondary | ICD-10-CM | POA: Diagnosis not present

## 2018-06-16 DIAGNOSIS — J189 Pneumonia, unspecified organism: Secondary | ICD-10-CM | POA: Diagnosis not present

## 2018-06-16 DIAGNOSIS — E1140 Type 2 diabetes mellitus with diabetic neuropathy, unspecified: Secondary | ICD-10-CM | POA: Diagnosis not present

## 2018-06-16 DIAGNOSIS — I255 Ischemic cardiomyopathy: Secondary | ICD-10-CM | POA: Diagnosis not present

## 2018-06-16 DIAGNOSIS — I2510 Atherosclerotic heart disease of native coronary artery without angina pectoris: Secondary | ICD-10-CM | POA: Diagnosis not present

## 2018-06-19 DIAGNOSIS — I5022 Chronic systolic (congestive) heart failure: Secondary | ICD-10-CM | POA: Diagnosis not present

## 2018-06-19 DIAGNOSIS — S066X0D Traumatic subarachnoid hemorrhage without loss of consciousness, subsequent encounter: Secondary | ICD-10-CM | POA: Diagnosis not present

## 2018-06-19 DIAGNOSIS — N183 Chronic kidney disease, stage 3 (moderate): Secondary | ICD-10-CM | POA: Diagnosis not present

## 2018-06-19 DIAGNOSIS — R0902 Hypoxemia: Secondary | ICD-10-CM | POA: Diagnosis not present

## 2018-06-19 DIAGNOSIS — I10 Essential (primary) hypertension: Secondary | ICD-10-CM | POA: Diagnosis not present

## 2018-06-19 DIAGNOSIS — G4733 Obstructive sleep apnea (adult) (pediatric): Secondary | ICD-10-CM | POA: Diagnosis not present

## 2018-06-19 DIAGNOSIS — I2510 Atherosclerotic heart disease of native coronary artery without angina pectoris: Secondary | ICD-10-CM | POA: Diagnosis not present

## 2018-06-19 DIAGNOSIS — E1140 Type 2 diabetes mellitus with diabetic neuropathy, unspecified: Secondary | ICD-10-CM | POA: Diagnosis not present

## 2018-06-19 DIAGNOSIS — J069 Acute upper respiratory infection, unspecified: Secondary | ICD-10-CM | POA: Diagnosis not present

## 2018-06-19 DIAGNOSIS — I69354 Hemiplegia and hemiparesis following cerebral infarction affecting left non-dominant side: Secondary | ICD-10-CM | POA: Diagnosis not present

## 2018-06-19 DIAGNOSIS — I5023 Acute on chronic systolic (congestive) heart failure: Secondary | ICD-10-CM | POA: Diagnosis not present

## 2018-06-19 DIAGNOSIS — E1165 Type 2 diabetes mellitus with hyperglycemia: Secondary | ICD-10-CM | POA: Diagnosis not present

## 2018-06-19 DIAGNOSIS — I480 Paroxysmal atrial fibrillation: Secondary | ICD-10-CM | POA: Diagnosis not present

## 2018-06-19 DIAGNOSIS — E1122 Type 2 diabetes mellitus with diabetic chronic kidney disease: Secondary | ICD-10-CM | POA: Diagnosis not present

## 2018-06-19 DIAGNOSIS — E1143 Type 2 diabetes mellitus with diabetic autonomic (poly)neuropathy: Secondary | ICD-10-CM | POA: Diagnosis not present

## 2018-06-19 DIAGNOSIS — E782 Mixed hyperlipidemia: Secondary | ICD-10-CM | POA: Diagnosis not present

## 2018-06-19 DIAGNOSIS — E1121 Type 2 diabetes mellitus with diabetic nephropathy: Secondary | ICD-10-CM | POA: Diagnosis not present

## 2018-06-19 DIAGNOSIS — I255 Ischemic cardiomyopathy: Secondary | ICD-10-CM | POA: Diagnosis not present

## 2018-06-19 DIAGNOSIS — I130 Hypertensive heart and chronic kidney disease with heart failure and stage 1 through stage 4 chronic kidney disease, or unspecified chronic kidney disease: Secondary | ICD-10-CM | POA: Diagnosis not present

## 2018-06-20 ENCOUNTER — Telehealth: Payer: Self-pay | Admitting: *Deleted

## 2018-06-20 ENCOUNTER — Ambulatory Visit (INDEPENDENT_AMBULATORY_CARE_PROVIDER_SITE_OTHER): Payer: Medicare HMO | Admitting: *Deleted

## 2018-06-20 DIAGNOSIS — S066X0D Traumatic subarachnoid hemorrhage without loss of consciousness, subsequent encounter: Secondary | ICD-10-CM | POA: Diagnosis not present

## 2018-06-20 DIAGNOSIS — R69 Illness, unspecified: Secondary | ICD-10-CM | POA: Diagnosis not present

## 2018-06-20 DIAGNOSIS — E1140 Type 2 diabetes mellitus with diabetic neuropathy, unspecified: Secondary | ICD-10-CM | POA: Diagnosis not present

## 2018-06-20 DIAGNOSIS — E1122 Type 2 diabetes mellitus with diabetic chronic kidney disease: Secondary | ICD-10-CM | POA: Diagnosis not present

## 2018-06-20 DIAGNOSIS — I255 Ischemic cardiomyopathy: Secondary | ICD-10-CM | POA: Diagnosis not present

## 2018-06-20 DIAGNOSIS — N183 Chronic kidney disease, stage 3 (moderate): Secondary | ICD-10-CM | POA: Diagnosis not present

## 2018-06-20 DIAGNOSIS — I4891 Unspecified atrial fibrillation: Secondary | ICD-10-CM

## 2018-06-20 DIAGNOSIS — Z5181 Encounter for therapeutic drug level monitoring: Secondary | ICD-10-CM

## 2018-06-20 DIAGNOSIS — I130 Hypertensive heart and chronic kidney disease with heart failure and stage 1 through stage 4 chronic kidney disease, or unspecified chronic kidney disease: Secondary | ICD-10-CM | POA: Diagnosis not present

## 2018-06-20 DIAGNOSIS — I69354 Hemiplegia and hemiparesis following cerebral infarction affecting left non-dominant side: Secondary | ICD-10-CM | POA: Diagnosis not present

## 2018-06-20 DIAGNOSIS — I480 Paroxysmal atrial fibrillation: Secondary | ICD-10-CM | POA: Diagnosis not present

## 2018-06-20 DIAGNOSIS — I5022 Chronic systolic (congestive) heart failure: Secondary | ICD-10-CM | POA: Diagnosis not present

## 2018-06-20 DIAGNOSIS — I2510 Atherosclerotic heart disease of native coronary artery without angina pectoris: Secondary | ICD-10-CM | POA: Diagnosis not present

## 2018-06-20 DIAGNOSIS — G4733 Obstructive sleep apnea (adult) (pediatric): Secondary | ICD-10-CM | POA: Diagnosis not present

## 2018-06-20 LAB — POCT INR: INR: 2.6 (ref 2.0–3.0)

## 2018-06-20 NOTE — Telephone Encounter (Signed)
Cathy with [facility] called with results:   INR 2.6  PT 30.7  States that patient is taking 2.5 mg every day except Tuesdays . On Tuesdays he is taking 5 mg  Please call [phone].

## 2018-06-20 NOTE — Patient Instructions (Signed)
Continue 1/2 tablet daily except 1 tablet on Tuesdays Recheck in 1 week. 

## 2018-06-20 NOTE — Telephone Encounter (Signed)
LMOM for Dennison Nancy LPN [facility] with orders.  See coumadin note.

## 2018-06-20 NOTE — Progress Notes (Signed)
LMOM for [facility] with orders.  See coumadin note.

## 2018-06-21 ENCOUNTER — Other Ambulatory Visit: Payer: Self-pay | Admitting: *Deleted

## 2018-06-21 DIAGNOSIS — I255 Ischemic cardiomyopathy: Secondary | ICD-10-CM | POA: Diagnosis not present

## 2018-06-21 DIAGNOSIS — N183 Chronic kidney disease, stage 3 (moderate): Secondary | ICD-10-CM | POA: Diagnosis not present

## 2018-06-21 DIAGNOSIS — I130 Hypertensive heart and chronic kidney disease with heart failure and stage 1 through stage 4 chronic kidney disease, or unspecified chronic kidney disease: Secondary | ICD-10-CM | POA: Diagnosis not present

## 2018-06-21 DIAGNOSIS — I480 Paroxysmal atrial fibrillation: Secondary | ICD-10-CM | POA: Diagnosis not present

## 2018-06-21 DIAGNOSIS — E1122 Type 2 diabetes mellitus with diabetic chronic kidney disease: Secondary | ICD-10-CM | POA: Diagnosis not present

## 2018-06-21 DIAGNOSIS — I69354 Hemiplegia and hemiparesis following cerebral infarction affecting left non-dominant side: Secondary | ICD-10-CM | POA: Diagnosis not present

## 2018-06-21 DIAGNOSIS — S066X0D Traumatic subarachnoid hemorrhage without loss of consciousness, subsequent encounter: Secondary | ICD-10-CM | POA: Diagnosis not present

## 2018-06-21 DIAGNOSIS — I2510 Atherosclerotic heart disease of native coronary artery without angina pectoris: Secondary | ICD-10-CM | POA: Diagnosis not present

## 2018-06-21 DIAGNOSIS — I5022 Chronic systolic (congestive) heart failure: Secondary | ICD-10-CM | POA: Diagnosis not present

## 2018-06-21 DIAGNOSIS — E1140 Type 2 diabetes mellitus with diabetic neuropathy, unspecified: Secondary | ICD-10-CM | POA: Diagnosis not present

## 2018-06-21 NOTE — Patient Outreach (Signed)
[facility]) Care Management  06/21/2018  Noah [name] 10-06-1940 [id number]   EMMI-general discharge RED ON EMMI ALERT Day # 4 Date: 06/20/18 1030 Red Alert Reason: Lost interest in things?Yes Sad/hopeless/anxious/empty?Yes  Insurance: [employer] medicare  [facility] admissions x 1 ED visits x1  in the last 6 months   Transition of care services noted to be completed by primary care MD office staff [facility]-Dr Sasser   Outreach attempt # 1 Patient is able to verify HIPAA [facility] Care Management RN reviewed and addressed red alert with patient  EMMI [name] reports the answers to the questions are incorrect He reports he does not have a loss of interest in things. He reports he is not Sad/hopeless/anxious/empty   Social: he lives at home with his wife He denies issues with transportation to medical appointments or with him completing his care needs    Consent: [facility] RN CM reviewed [facility] services with patient. Patient gave verbal consent for services.   Advised patient that there will be further automated EMMI- post discharge calls to assess how the patient is doing following the recent hospitalization Advised the patient that another call may be received from a nurse if any of their responses were abnormal. Patient voiced understanding and was appreciative of f/u call.   Plan: [facility] RN CM will close case at this time as patient has been assessed and no needs identified/needs resolved.   Pt encouraged to return a call to [facility] RN CM prn  [facility] RN CM sent a successful outreach letter as discussed with [facility] brochure enclosed for review   Kimberly L. Noelle Penner, RN, BSN, CCM [facility] Telephonic Care Management Care Coordinator Office number [phone] Mobile number [phone]  Main [facility] number [phone] Fax number [phone]

## 2018-06-22 DIAGNOSIS — S066X0D Traumatic subarachnoid hemorrhage without loss of consciousness, subsequent encounter: Secondary | ICD-10-CM | POA: Diagnosis not present

## 2018-06-22 DIAGNOSIS — E1140 Type 2 diabetes mellitus with diabetic neuropathy, unspecified: Secondary | ICD-10-CM | POA: Diagnosis not present

## 2018-06-22 DIAGNOSIS — I130 Hypertensive heart and chronic kidney disease with heart failure and stage 1 through stage 4 chronic kidney disease, or unspecified chronic kidney disease: Secondary | ICD-10-CM | POA: Diagnosis not present

## 2018-06-22 DIAGNOSIS — E1122 Type 2 diabetes mellitus with diabetic chronic kidney disease: Secondary | ICD-10-CM | POA: Diagnosis not present

## 2018-06-22 DIAGNOSIS — I480 Paroxysmal atrial fibrillation: Secondary | ICD-10-CM | POA: Diagnosis not present

## 2018-06-22 DIAGNOSIS — N183 Chronic kidney disease, stage 3 (moderate): Secondary | ICD-10-CM | POA: Diagnosis not present

## 2018-06-22 DIAGNOSIS — I69354 Hemiplegia and hemiparesis following cerebral infarction affecting left non-dominant side: Secondary | ICD-10-CM | POA: Diagnosis not present

## 2018-06-22 DIAGNOSIS — I2510 Atherosclerotic heart disease of native coronary artery without angina pectoris: Secondary | ICD-10-CM | POA: Diagnosis not present

## 2018-06-22 DIAGNOSIS — I5022 Chronic systolic (congestive) heart failure: Secondary | ICD-10-CM | POA: Diagnosis not present

## 2018-06-22 DIAGNOSIS — I255 Ischemic cardiomyopathy: Secondary | ICD-10-CM | POA: Diagnosis not present

## 2018-06-23 DIAGNOSIS — N183 Chronic kidney disease, stage 3 (moderate): Secondary | ICD-10-CM | POA: Diagnosis not present

## 2018-06-23 DIAGNOSIS — I255 Ischemic cardiomyopathy: Secondary | ICD-10-CM | POA: Diagnosis not present

## 2018-06-23 DIAGNOSIS — I69354 Hemiplegia and hemiparesis following cerebral infarction affecting left non-dominant side: Secondary | ICD-10-CM | POA: Diagnosis not present

## 2018-06-23 DIAGNOSIS — I2510 Atherosclerotic heart disease of native coronary artery without angina pectoris: Secondary | ICD-10-CM | POA: Diagnosis not present

## 2018-06-23 DIAGNOSIS — I5022 Chronic systolic (congestive) heart failure: Secondary | ICD-10-CM | POA: Diagnosis not present

## 2018-06-23 DIAGNOSIS — E1140 Type 2 diabetes mellitus with diabetic neuropathy, unspecified: Secondary | ICD-10-CM | POA: Diagnosis not present

## 2018-06-23 DIAGNOSIS — I480 Paroxysmal atrial fibrillation: Secondary | ICD-10-CM | POA: Diagnosis not present

## 2018-06-23 DIAGNOSIS — E1122 Type 2 diabetes mellitus with diabetic chronic kidney disease: Secondary | ICD-10-CM | POA: Diagnosis not present

## 2018-06-23 DIAGNOSIS — S066X0D Traumatic subarachnoid hemorrhage without loss of consciousness, subsequent encounter: Secondary | ICD-10-CM | POA: Diagnosis not present

## 2018-06-23 DIAGNOSIS — I130 Hypertensive heart and chronic kidney disease with heart failure and stage 1 through stage 4 chronic kidney disease, or unspecified chronic kidney disease: Secondary | ICD-10-CM | POA: Diagnosis not present

## 2018-06-26 DIAGNOSIS — S066X0D Traumatic subarachnoid hemorrhage without loss of consciousness, subsequent encounter: Secondary | ICD-10-CM | POA: Diagnosis not present

## 2018-06-26 DIAGNOSIS — I2510 Atherosclerotic heart disease of native coronary artery without angina pectoris: Secondary | ICD-10-CM | POA: Diagnosis not present

## 2018-06-26 DIAGNOSIS — I480 Paroxysmal atrial fibrillation: Secondary | ICD-10-CM | POA: Diagnosis not present

## 2018-06-26 DIAGNOSIS — I5022 Chronic systolic (congestive) heart failure: Secondary | ICD-10-CM | POA: Diagnosis not present

## 2018-06-26 DIAGNOSIS — E1122 Type 2 diabetes mellitus with diabetic chronic kidney disease: Secondary | ICD-10-CM | POA: Diagnosis not present

## 2018-06-26 DIAGNOSIS — I69354 Hemiplegia and hemiparesis following cerebral infarction affecting left non-dominant side: Secondary | ICD-10-CM | POA: Diagnosis not present

## 2018-06-26 DIAGNOSIS — I130 Hypertensive heart and chronic kidney disease with heart failure and stage 1 through stage 4 chronic kidney disease, or unspecified chronic kidney disease: Secondary | ICD-10-CM | POA: Diagnosis not present

## 2018-06-26 DIAGNOSIS — I255 Ischemic cardiomyopathy: Secondary | ICD-10-CM | POA: Diagnosis not present

## 2018-06-26 DIAGNOSIS — N183 Chronic kidney disease, stage 3 (moderate): Secondary | ICD-10-CM | POA: Diagnosis not present

## 2018-06-26 DIAGNOSIS — E1140 Type 2 diabetes mellitus with diabetic neuropathy, unspecified: Secondary | ICD-10-CM | POA: Diagnosis not present

## 2018-06-28 ENCOUNTER — Ambulatory Visit (INDEPENDENT_AMBULATORY_CARE_PROVIDER_SITE_OTHER): Payer: Medicare HMO | Admitting: *Deleted

## 2018-06-28 ENCOUNTER — Telehealth: Payer: Self-pay | Admitting: *Deleted

## 2018-06-28 DIAGNOSIS — N183 Chronic kidney disease, stage 3 (moderate): Secondary | ICD-10-CM | POA: Diagnosis not present

## 2018-06-28 DIAGNOSIS — I255 Ischemic cardiomyopathy: Secondary | ICD-10-CM | POA: Diagnosis not present

## 2018-06-28 DIAGNOSIS — I5022 Chronic systolic (congestive) heart failure: Secondary | ICD-10-CM | POA: Diagnosis not present

## 2018-06-28 DIAGNOSIS — I4891 Unspecified atrial fibrillation: Secondary | ICD-10-CM

## 2018-06-28 DIAGNOSIS — I69354 Hemiplegia and hemiparesis following cerebral infarction affecting left non-dominant side: Secondary | ICD-10-CM | POA: Diagnosis not present

## 2018-06-28 DIAGNOSIS — I130 Hypertensive heart and chronic kidney disease with heart failure and stage 1 through stage 4 chronic kidney disease, or unspecified chronic kidney disease: Secondary | ICD-10-CM | POA: Diagnosis not present

## 2018-06-28 DIAGNOSIS — Z5181 Encounter for therapeutic drug level monitoring: Secondary | ICD-10-CM

## 2018-06-28 DIAGNOSIS — E1122 Type 2 diabetes mellitus with diabetic chronic kidney disease: Secondary | ICD-10-CM | POA: Diagnosis not present

## 2018-06-28 DIAGNOSIS — E1140 Type 2 diabetes mellitus with diabetic neuropathy, unspecified: Secondary | ICD-10-CM | POA: Diagnosis not present

## 2018-06-28 DIAGNOSIS — I2510 Atherosclerotic heart disease of native coronary artery without angina pectoris: Secondary | ICD-10-CM | POA: Diagnosis not present

## 2018-06-28 DIAGNOSIS — I480 Paroxysmal atrial fibrillation: Secondary | ICD-10-CM | POA: Diagnosis not present

## 2018-06-28 DIAGNOSIS — S066X0D Traumatic subarachnoid hemorrhage without loss of consciousness, subsequent encounter: Secondary | ICD-10-CM | POA: Diagnosis not present

## 2018-06-28 LAB — POCT INR: INR: 2.8 (ref 2.0–3.0)

## 2018-06-28 NOTE — Telephone Encounter (Signed)
INR 2.8 PT 33.7  2.5mg  everyday except Tuesdays he does 5mg   Per [place] w/ [facility] [phone]

## 2018-06-28 NOTE — Telephone Encounter (Signed)
Done.  See coumadin note. 

## 2018-06-28 NOTE — Patient Instructions (Signed)
Continue 1/2 tablet daily except 1 tablet on Tuesdays Recheck in 1 week.

## 2018-06-29 DIAGNOSIS — I69354 Hemiplegia and hemiparesis following cerebral infarction affecting left non-dominant side: Secondary | ICD-10-CM | POA: Diagnosis not present

## 2018-06-29 DIAGNOSIS — I255 Ischemic cardiomyopathy: Secondary | ICD-10-CM | POA: Diagnosis not present

## 2018-06-29 DIAGNOSIS — I480 Paroxysmal atrial fibrillation: Secondary | ICD-10-CM | POA: Diagnosis not present

## 2018-06-29 DIAGNOSIS — E1122 Type 2 diabetes mellitus with diabetic chronic kidney disease: Secondary | ICD-10-CM | POA: Diagnosis not present

## 2018-06-29 DIAGNOSIS — N183 Chronic kidney disease, stage 3 (moderate): Secondary | ICD-10-CM | POA: Diagnosis not present

## 2018-06-29 DIAGNOSIS — I130 Hypertensive heart and chronic kidney disease with heart failure and stage 1 through stage 4 chronic kidney disease, or unspecified chronic kidney disease: Secondary | ICD-10-CM | POA: Diagnosis not present

## 2018-06-29 DIAGNOSIS — S066X0D Traumatic subarachnoid hemorrhage without loss of consciousness, subsequent encounter: Secondary | ICD-10-CM | POA: Diagnosis not present

## 2018-06-29 DIAGNOSIS — I2510 Atherosclerotic heart disease of native coronary artery without angina pectoris: Secondary | ICD-10-CM | POA: Diagnosis not present

## 2018-06-29 DIAGNOSIS — I5022 Chronic systolic (congestive) heart failure: Secondary | ICD-10-CM | POA: Diagnosis not present

## 2018-06-29 DIAGNOSIS — E1140 Type 2 diabetes mellitus with diabetic neuropathy, unspecified: Secondary | ICD-10-CM | POA: Diagnosis not present

## 2018-07-03 DIAGNOSIS — E1140 Type 2 diabetes mellitus with diabetic neuropathy, unspecified: Secondary | ICD-10-CM | POA: Diagnosis not present

## 2018-07-03 DIAGNOSIS — E1122 Type 2 diabetes mellitus with diabetic chronic kidney disease: Secondary | ICD-10-CM | POA: Diagnosis not present

## 2018-07-03 DIAGNOSIS — I480 Paroxysmal atrial fibrillation: Secondary | ICD-10-CM | POA: Diagnosis not present

## 2018-07-03 DIAGNOSIS — I69354 Hemiplegia and hemiparesis following cerebral infarction affecting left non-dominant side: Secondary | ICD-10-CM | POA: Diagnosis not present

## 2018-07-03 DIAGNOSIS — N183 Chronic kidney disease, stage 3 (moderate): Secondary | ICD-10-CM | POA: Diagnosis not present

## 2018-07-03 DIAGNOSIS — I255 Ischemic cardiomyopathy: Secondary | ICD-10-CM | POA: Diagnosis not present

## 2018-07-03 DIAGNOSIS — I130 Hypertensive heart and chronic kidney disease with heart failure and stage 1 through stage 4 chronic kidney disease, or unspecified chronic kidney disease: Secondary | ICD-10-CM | POA: Diagnosis not present

## 2018-07-03 DIAGNOSIS — I5022 Chronic systolic (congestive) heart failure: Secondary | ICD-10-CM | POA: Diagnosis not present

## 2018-07-03 DIAGNOSIS — I2510 Atherosclerotic heart disease of native coronary artery without angina pectoris: Secondary | ICD-10-CM | POA: Diagnosis not present

## 2018-07-03 DIAGNOSIS — S066X0D Traumatic subarachnoid hemorrhage without loss of consciousness, subsequent encounter: Secondary | ICD-10-CM | POA: Diagnosis not present

## 2018-07-04 DIAGNOSIS — S066X0D Traumatic subarachnoid hemorrhage without loss of consciousness, subsequent encounter: Secondary | ICD-10-CM | POA: Diagnosis not present

## 2018-07-04 DIAGNOSIS — E1122 Type 2 diabetes mellitus with diabetic chronic kidney disease: Secondary | ICD-10-CM | POA: Diagnosis not present

## 2018-07-04 DIAGNOSIS — I69354 Hemiplegia and hemiparesis following cerebral infarction affecting left non-dominant side: Secondary | ICD-10-CM | POA: Diagnosis not present

## 2018-07-04 DIAGNOSIS — I130 Hypertensive heart and chronic kidney disease with heart failure and stage 1 through stage 4 chronic kidney disease, or unspecified chronic kidney disease: Secondary | ICD-10-CM | POA: Diagnosis not present

## 2018-07-04 DIAGNOSIS — I255 Ischemic cardiomyopathy: Secondary | ICD-10-CM | POA: Diagnosis not present

## 2018-07-04 DIAGNOSIS — I5022 Chronic systolic (congestive) heart failure: Secondary | ICD-10-CM | POA: Diagnosis not present

## 2018-07-04 DIAGNOSIS — I480 Paroxysmal atrial fibrillation: Secondary | ICD-10-CM | POA: Diagnosis not present

## 2018-07-04 DIAGNOSIS — I2510 Atherosclerotic heart disease of native coronary artery without angina pectoris: Secondary | ICD-10-CM | POA: Diagnosis not present

## 2018-07-04 DIAGNOSIS — N183 Chronic kidney disease, stage 3 (moderate): Secondary | ICD-10-CM | POA: Diagnosis not present

## 2018-07-04 DIAGNOSIS — E1140 Type 2 diabetes mellitus with diabetic neuropathy, unspecified: Secondary | ICD-10-CM | POA: Diagnosis not present

## 2018-07-05 ENCOUNTER — Ambulatory Visit (INDEPENDENT_AMBULATORY_CARE_PROVIDER_SITE_OTHER): Payer: Medicare HMO | Admitting: *Deleted

## 2018-07-05 ENCOUNTER — Telehealth: Payer: Self-pay | Admitting: Cardiovascular Disease

## 2018-07-05 DIAGNOSIS — I255 Ischemic cardiomyopathy: Secondary | ICD-10-CM

## 2018-07-05 DIAGNOSIS — I4891 Unspecified atrial fibrillation: Secondary | ICD-10-CM

## 2018-07-05 DIAGNOSIS — Z5181 Encounter for therapeutic drug level monitoring: Secondary | ICD-10-CM

## 2018-07-05 LAB — POCT INR: INR: 4 — AB (ref 2.0–3.0)

## 2018-07-05 NOTE — Telephone Encounter (Signed)
Done.  See coumadin note. 

## 2018-07-05 NOTE — Patient Instructions (Signed)
Hold coumadin tonight then decrease dose to 1/2 tablet daily  Recheck in 2 weeks 

## 2018-07-05 NOTE — Telephone Encounter (Signed)
inr 4 Being discharged today

## 2018-07-07 DIAGNOSIS — E1122 Type 2 diabetes mellitus with diabetic chronic kidney disease: Secondary | ICD-10-CM | POA: Diagnosis not present

## 2018-07-07 DIAGNOSIS — I69354 Hemiplegia and hemiparesis following cerebral infarction affecting left non-dominant side: Secondary | ICD-10-CM | POA: Diagnosis not present

## 2018-07-07 DIAGNOSIS — I255 Ischemic cardiomyopathy: Secondary | ICD-10-CM | POA: Diagnosis not present

## 2018-07-07 DIAGNOSIS — S066X0D Traumatic subarachnoid hemorrhage without loss of consciousness, subsequent encounter: Secondary | ICD-10-CM | POA: Diagnosis not present

## 2018-07-07 DIAGNOSIS — I5022 Chronic systolic (congestive) heart failure: Secondary | ICD-10-CM | POA: Diagnosis not present

## 2018-07-07 DIAGNOSIS — N183 Chronic kidney disease, stage 3 (moderate): Secondary | ICD-10-CM | POA: Diagnosis not present

## 2018-07-07 DIAGNOSIS — E1140 Type 2 diabetes mellitus with diabetic neuropathy, unspecified: Secondary | ICD-10-CM | POA: Diagnosis not present

## 2018-07-07 DIAGNOSIS — I130 Hypertensive heart and chronic kidney disease with heart failure and stage 1 through stage 4 chronic kidney disease, or unspecified chronic kidney disease: Secondary | ICD-10-CM | POA: Diagnosis not present

## 2018-07-07 DIAGNOSIS — I480 Paroxysmal atrial fibrillation: Secondary | ICD-10-CM | POA: Diagnosis not present

## 2018-07-07 DIAGNOSIS — I2510 Atherosclerotic heart disease of native coronary artery without angina pectoris: Secondary | ICD-10-CM | POA: Diagnosis not present

## 2018-07-13 DIAGNOSIS — I5022 Chronic systolic (congestive) heart failure: Secondary | ICD-10-CM | POA: Diagnosis not present

## 2018-07-13 DIAGNOSIS — I130 Hypertensive heart and chronic kidney disease with heart failure and stage 1 through stage 4 chronic kidney disease, or unspecified chronic kidney disease: Secondary | ICD-10-CM | POA: Diagnosis not present

## 2018-07-13 DIAGNOSIS — I480 Paroxysmal atrial fibrillation: Secondary | ICD-10-CM | POA: Diagnosis not present

## 2018-07-13 DIAGNOSIS — S066X0D Traumatic subarachnoid hemorrhage without loss of consciousness, subsequent encounter: Secondary | ICD-10-CM | POA: Diagnosis not present

## 2018-07-13 DIAGNOSIS — I69354 Hemiplegia and hemiparesis following cerebral infarction affecting left non-dominant side: Secondary | ICD-10-CM | POA: Diagnosis not present

## 2018-07-13 DIAGNOSIS — E1140 Type 2 diabetes mellitus with diabetic neuropathy, unspecified: Secondary | ICD-10-CM | POA: Diagnosis not present

## 2018-07-13 DIAGNOSIS — N183 Chronic kidney disease, stage 3 (moderate): Secondary | ICD-10-CM | POA: Diagnosis not present

## 2018-07-13 DIAGNOSIS — E1122 Type 2 diabetes mellitus with diabetic chronic kidney disease: Secondary | ICD-10-CM | POA: Diagnosis not present

## 2018-07-13 DIAGNOSIS — I2510 Atherosclerotic heart disease of native coronary artery without angina pectoris: Secondary | ICD-10-CM | POA: Diagnosis not present

## 2018-07-13 DIAGNOSIS — I255 Ischemic cardiomyopathy: Secondary | ICD-10-CM | POA: Diagnosis not present

## 2018-07-14 ENCOUNTER — Telehealth: Payer: Self-pay | Admitting: *Deleted

## 2018-07-14 DIAGNOSIS — S066X0D Traumatic subarachnoid hemorrhage without loss of consciousness, subsequent encounter: Secondary | ICD-10-CM | POA: Diagnosis not present

## 2018-07-14 DIAGNOSIS — I69354 Hemiplegia and hemiparesis following cerebral infarction affecting left non-dominant side: Secondary | ICD-10-CM | POA: Diagnosis not present

## 2018-07-14 DIAGNOSIS — E782 Mixed hyperlipidemia: Secondary | ICD-10-CM | POA: Diagnosis not present

## 2018-07-14 DIAGNOSIS — I5023 Acute on chronic systolic (congestive) heart failure: Secondary | ICD-10-CM | POA: Diagnosis not present

## 2018-07-14 DIAGNOSIS — E1122 Type 2 diabetes mellitus with diabetic chronic kidney disease: Secondary | ICD-10-CM | POA: Diagnosis not present

## 2018-07-14 DIAGNOSIS — R0902 Hypoxemia: Secondary | ICD-10-CM | POA: Diagnosis not present

## 2018-07-14 DIAGNOSIS — I69398 Other sequelae of cerebral infarction: Secondary | ICD-10-CM | POA: Diagnosis not present

## 2018-07-14 DIAGNOSIS — J069 Acute upper respiratory infection, unspecified: Secondary | ICD-10-CM | POA: Diagnosis not present

## 2018-07-14 DIAGNOSIS — E1121 Type 2 diabetes mellitus with diabetic nephropathy: Secondary | ICD-10-CM | POA: Diagnosis not present

## 2018-07-14 DIAGNOSIS — I10 Essential (primary) hypertension: Secondary | ICD-10-CM | POA: Diagnosis not present

## 2018-07-14 DIAGNOSIS — E1143 Type 2 diabetes mellitus with diabetic autonomic (poly)neuropathy: Secondary | ICD-10-CM | POA: Diagnosis not present

## 2018-07-14 NOTE — Telephone Encounter (Signed)
° °  COVID-19 Pre-Screening Questions: ° °• Do you currently have a fever?  (yes = cancel and refer to pcp for e-visit)   NO °• Have you recently travelled on a cruise, internationally, or to [place], [place], [place], [place], [place], or [place], [place] ([employer]) ? (yes = cancel, stay home, monitor symptoms, and contact pcp or initiate e-visit if symptoms develop)   NO °• Have you been in contact with someone that is currently pending confirmation of Covid19 testing or has been confirmed to have the Covid19 virus?  O (yes = cancel, stay home, away from tested individual, monitor symptoms, and contact pcp or initiate e-visit if symptoms develop)  NO °• Are you currently experiencing fatigue or cough?  (yes = pt should be prepared to have a mask placed at the time of their visit).  NO ° ° °   ° ° ° ° °

## 2018-07-18 ENCOUNTER — Telehealth: Payer: Self-pay | Admitting: *Deleted

## 2018-07-18 NOTE — Telephone Encounter (Signed)
COVID-19 Pre-Screening Questions:  . Do you currently have a fever?NO (yes = cancel and refer to pcp for e-visit) . Have you recently travelled on a cruise, internationally, or to [place], [place], [place], [place], [place], or [place], [place] ([employer]) ? NO (yes = cancel, stay home, monitor symptoms, and contact pcp or initiate e-visit if symptoms develop) . Have you been in contact with someone that is currently pending confirmation of Covid19 testing or has been confirmed to have the Covid19 virus?  NO (yes = cancel, stay home, away from tested individual, monitor symptoms, and contact pcp or initiate e-visit if symptoms develop) . Are you currently experiencing fatigue or cough? NO (yes = pt should be prepared to have a mask placed at the time of their visit).      

## 2018-07-19 ENCOUNTER — Ambulatory Visit (INDEPENDENT_AMBULATORY_CARE_PROVIDER_SITE_OTHER): Payer: Medicare HMO | Admitting: *Deleted

## 2018-07-19 ENCOUNTER — Other Ambulatory Visit: Payer: Self-pay

## 2018-07-19 DIAGNOSIS — I4891 Unspecified atrial fibrillation: Secondary | ICD-10-CM

## 2018-07-19 DIAGNOSIS — Z5181 Encounter for therapeutic drug level monitoring: Secondary | ICD-10-CM | POA: Diagnosis not present

## 2018-07-19 DIAGNOSIS — I255 Ischemic cardiomyopathy: Secondary | ICD-10-CM

## 2018-07-19 LAB — POCT INR: INR: 2 (ref 2.0–3.0)

## 2018-07-19 NOTE — Patient Instructions (Signed)
Continue coumadin 1/2 tablet daily  Recheck in 2 weeks

## 2018-07-25 ENCOUNTER — Other Ambulatory Visit: Payer: Self-pay

## 2018-07-25 ENCOUNTER — Ambulatory Visit (INDEPENDENT_AMBULATORY_CARE_PROVIDER_SITE_OTHER): Payer: Medicare HMO | Admitting: *Deleted

## 2018-07-25 ENCOUNTER — Telehealth: Payer: Self-pay

## 2018-07-25 DIAGNOSIS — I255 Ischemic cardiomyopathy: Secondary | ICD-10-CM | POA: Diagnosis not present

## 2018-07-25 LAB — CUP PACEART REMOTE DEVICE CHECK
Battery Remaining Longevity: 77 mo
Battery Remaining Percentage: 88 %
Battery Voltage: 3.05 V
Brady Statistic AP VP Percent: 1.8 %
Brady Statistic AP VS Percent: 1 %
Brady Statistic AS VP Percent: 96 %
Brady Statistic AS VS Percent: 1.4 %
Brady Statistic RA Percent Paced: 1.2 %
Date Time Interrogation Session: 20200407171729
HighPow Impedance: 88 Ohm
HighPow Impedance: 88 Ohm
Implantable Lead Implant Date: 20191004
Implantable Lead Implant Date: 20191004
Implantable Lead Implant Date: 20191004
Implantable Lead Location: 753858
Implantable Lead Location: 753859
Implantable Lead Location: 753860
Implantable Lead Model: 7122
Implantable Pulse Generator Implant Date: 20191004
Lead Channel Impedance Value: 450 Ohm
Lead Channel Impedance Value: 450 Ohm
Lead Channel Impedance Value: 910 Ohm
Lead Channel Pacing Threshold Amplitude: 0.75 V
Lead Channel Pacing Threshold Amplitude: 0.875 V
Lead Channel Pacing Threshold Amplitude: 1.5 V
Lead Channel Pacing Threshold Pulse Width: 0.5 ms
Lead Channel Pacing Threshold Pulse Width: 0.5 ms
Lead Channel Pacing Threshold Pulse Width: 0.5 ms
Lead Channel Sensing Intrinsic Amplitude: 12 mV
Lead Channel Sensing Intrinsic Amplitude: 2.4 mV
Lead Channel Setting Pacing Amplitude: 2 V
Lead Channel Setting Pacing Amplitude: 2 V
Lead Channel Setting Pacing Amplitude: 2.5 V
Lead Channel Setting Pacing Pulse Width: 0.5 ms
Lead Channel Setting Pacing Pulse Width: 0.5 ms
Lead Channel Setting Sensing Sensitivity: 0.5 mV
Pulse Gen Serial Number: 9810994

## 2018-07-25 NOTE — Telephone Encounter (Signed)
Spoke with patient to remind of missed remote transmission 

## 2018-08-01 ENCOUNTER — Telehealth: Payer: Self-pay | Admitting: *Deleted

## 2018-08-01 NOTE — Telephone Encounter (Signed)
°  ° °  COVID-19 Pre-Screening Questions:   Do you currently have a fever?no    Have you recently travelled on a cruise, internationally, or to [place], [place], [place], [place], [place], or [place], [place] [employer]) ? NO    Have you been in contact with someone that is currently pending confirmation of Covid19 testing or has been confirmed to have the Covid19 virus?  NO   Are you currently experiencing fatigue or cough? NO

## 2018-08-02 ENCOUNTER — Other Ambulatory Visit: Payer: Self-pay

## 2018-08-02 ENCOUNTER — Ambulatory Visit (INDEPENDENT_AMBULATORY_CARE_PROVIDER_SITE_OTHER): Payer: Medicare HMO | Admitting: *Deleted

## 2018-08-02 DIAGNOSIS — N401 Enlarged prostate with lower urinary tract symptoms: Secondary | ICD-10-CM | POA: Diagnosis not present

## 2018-08-02 DIAGNOSIS — I10 Essential (primary) hypertension: Secondary | ICD-10-CM | POA: Diagnosis not present

## 2018-08-02 DIAGNOSIS — Z0001 Encounter for general adult medical examination with abnormal findings: Secondary | ICD-10-CM | POA: Diagnosis not present

## 2018-08-02 DIAGNOSIS — I4891 Unspecified atrial fibrillation: Secondary | ICD-10-CM | POA: Diagnosis not present

## 2018-08-02 DIAGNOSIS — E1142 Type 2 diabetes mellitus with diabetic polyneuropathy: Secondary | ICD-10-CM | POA: Diagnosis not present

## 2018-08-02 DIAGNOSIS — I255 Ischemic cardiomyopathy: Secondary | ICD-10-CM | POA: Diagnosis not present

## 2018-08-02 DIAGNOSIS — Z5181 Encounter for therapeutic drug level monitoring: Secondary | ICD-10-CM | POA: Diagnosis not present

## 2018-08-02 DIAGNOSIS — E782 Mixed hyperlipidemia: Secondary | ICD-10-CM | POA: Diagnosis not present

## 2018-08-02 DIAGNOSIS — G629 Polyneuropathy, unspecified: Secondary | ICD-10-CM | POA: Diagnosis not present

## 2018-08-02 DIAGNOSIS — Z6841 Body Mass Index (BMI) 40.0 and over, adult: Secondary | ICD-10-CM | POA: Diagnosis not present

## 2018-08-02 DIAGNOSIS — I259 Chronic ischemic heart disease, unspecified: Secondary | ICD-10-CM | POA: Diagnosis not present

## 2018-08-02 LAB — POCT INR: INR: 1.7 — AB (ref 2.0–3.0)

## 2018-08-02 NOTE — Patient Instructions (Signed)
Take coumadin 1 tablet tonight then increase dose to 1/2 tablet daily except 1 tablet on Saturdays Recheck in 3 weeks

## 2018-08-03 ENCOUNTER — Encounter: Payer: Self-pay | Admitting: Cardiology

## 2018-08-03 DIAGNOSIS — L02612 Cutaneous abscess of left foot: Secondary | ICD-10-CM | POA: Diagnosis not present

## 2018-08-03 DIAGNOSIS — B351 Tinea unguium: Secondary | ICD-10-CM | POA: Diagnosis not present

## 2018-08-03 DIAGNOSIS — L110 Acquired keratosis follicularis: Secondary | ICD-10-CM | POA: Diagnosis not present

## 2018-08-03 DIAGNOSIS — E1140 Type 2 diabetes mellitus with diabetic neuropathy, unspecified: Secondary | ICD-10-CM | POA: Diagnosis not present

## 2018-08-03 NOTE — Progress Notes (Signed)
Remote ICD transmission.   

## 2018-08-14 DIAGNOSIS — I5023 Acute on chronic systolic (congestive) heart failure: Secondary | ICD-10-CM | POA: Diagnosis not present

## 2018-08-14 DIAGNOSIS — I69398 Other sequelae of cerebral infarction: Secondary | ICD-10-CM | POA: Diagnosis not present

## 2018-08-14 DIAGNOSIS — S066X0D Traumatic subarachnoid hemorrhage without loss of consciousness, subsequent encounter: Secondary | ICD-10-CM | POA: Diagnosis not present

## 2018-08-14 DIAGNOSIS — I69354 Hemiplegia and hemiparesis following cerebral infarction affecting left non-dominant side: Secondary | ICD-10-CM | POA: Diagnosis not present

## 2018-08-15 ENCOUNTER — Other Ambulatory Visit: Payer: Self-pay

## 2018-08-15 MED ORDER — SACUBITRIL-VALSARTAN 49-51 MG PO TABS: 1.0000 | ORAL_TABLET | Freq: Two times a day (BID) | ORAL | 6 refills | Status: DC

## 2018-08-15 NOTE — Telephone Encounter (Signed)
refilled entresto per fax request

## 2018-08-16 DIAGNOSIS — L89892 Pressure ulcer of other site, stage 2: Secondary | ICD-10-CM | POA: Diagnosis not present

## 2018-08-16 DIAGNOSIS — E1151 Type 2 diabetes mellitus with diabetic peripheral angiopathy without gangrene: Secondary | ICD-10-CM | POA: Diagnosis not present

## 2018-08-16 DIAGNOSIS — M79671 Pain in right foot: Secondary | ICD-10-CM | POA: Diagnosis not present

## 2018-08-16 DIAGNOSIS — M79672 Pain in left foot: Secondary | ICD-10-CM | POA: Diagnosis not present

## 2018-08-16 DIAGNOSIS — E1140 Type 2 diabetes mellitus with diabetic neuropathy, unspecified: Secondary | ICD-10-CM | POA: Diagnosis not present

## 2018-08-17 ENCOUNTER — Telehealth: Payer: Self-pay | Admitting: *Deleted

## 2018-08-17 DIAGNOSIS — I10 Essential (primary) hypertension: Secondary | ICD-10-CM | POA: Diagnosis not present

## 2018-08-17 DIAGNOSIS — E782 Mixed hyperlipidemia: Secondary | ICD-10-CM | POA: Diagnosis not present

## 2018-08-17 NOTE — Telephone Encounter (Signed)
Offered VV and patient declined. Denies any worsening cardiac symptoms. Appointment rescheduled per patient request and advised to contact office sooner if problems arise. Verbalized understanding.

## 2018-08-22 ENCOUNTER — Ambulatory Visit: Payer: Medicare HMO | Admitting: Cardiovascular Disease

## 2018-08-28 ENCOUNTER — Ambulatory Visit (INDEPENDENT_AMBULATORY_CARE_PROVIDER_SITE_OTHER): Payer: Medicare HMO | Admitting: *Deleted

## 2018-08-28 ENCOUNTER — Other Ambulatory Visit: Payer: Self-pay

## 2018-08-28 DIAGNOSIS — Z5181 Encounter for therapeutic drug level monitoring: Secondary | ICD-10-CM

## 2018-08-28 DIAGNOSIS — I255 Ischemic cardiomyopathy: Secondary | ICD-10-CM

## 2018-08-28 DIAGNOSIS — I4891 Unspecified atrial fibrillation: Secondary | ICD-10-CM

## 2018-08-28 LAB — POCT INR: INR: 5.1 — AB (ref 2.0–3.0)

## 2018-08-28 NOTE — Patient Instructions (Signed)
Hold coumadin tonight and tomorrow night then resume 1 tablet tonight then increase dose to 1/2 tablet daily except 1 tablet on Saturdays Been on Bactrim bid x 7 days for foot wound.  Finished yesterday. Recheck in 10 days

## 2018-08-30 DIAGNOSIS — M79675 Pain in left toe(s): Secondary | ICD-10-CM | POA: Diagnosis not present

## 2018-08-30 DIAGNOSIS — L02612 Cutaneous abscess of left foot: Secondary | ICD-10-CM | POA: Diagnosis not present

## 2018-08-30 DIAGNOSIS — L89892 Pressure ulcer of other site, stage 2: Secondary | ICD-10-CM | POA: Diagnosis not present

## 2018-09-06 ENCOUNTER — Other Ambulatory Visit: Payer: Self-pay

## 2018-09-06 ENCOUNTER — Ambulatory Visit (INDEPENDENT_AMBULATORY_CARE_PROVIDER_SITE_OTHER): Payer: Medicare HMO | Admitting: *Deleted

## 2018-09-06 DIAGNOSIS — Z5181 Encounter for therapeutic drug level monitoring: Secondary | ICD-10-CM

## 2018-09-06 DIAGNOSIS — I255 Ischemic cardiomyopathy: Secondary | ICD-10-CM | POA: Diagnosis not present

## 2018-09-06 DIAGNOSIS — I4891 Unspecified atrial fibrillation: Secondary | ICD-10-CM

## 2018-09-06 LAB — POCT INR: INR: 2.1 (ref 2.0–3.0)

## 2018-09-06 NOTE — Patient Instructions (Signed)
Continue coumadin 1/2 tablet daily except 1 tablet on Saturdays Recheck on 09/25/18

## 2018-09-13 DIAGNOSIS — I5023 Acute on chronic systolic (congestive) heart failure: Secondary | ICD-10-CM | POA: Diagnosis not present

## 2018-09-13 DIAGNOSIS — I69398 Other sequelae of cerebral infarction: Secondary | ICD-10-CM | POA: Diagnosis not present

## 2018-09-13 DIAGNOSIS — L02612 Cutaneous abscess of left foot: Secondary | ICD-10-CM | POA: Diagnosis not present

## 2018-09-13 DIAGNOSIS — M79672 Pain in left foot: Secondary | ICD-10-CM | POA: Diagnosis not present

## 2018-09-13 DIAGNOSIS — S066X0D Traumatic subarachnoid hemorrhage without loss of consciousness, subsequent encounter: Secondary | ICD-10-CM | POA: Diagnosis not present

## 2018-09-13 DIAGNOSIS — I69354 Hemiplegia and hemiparesis following cerebral infarction affecting left non-dominant side: Secondary | ICD-10-CM | POA: Diagnosis not present

## 2018-09-15 ENCOUNTER — Other Ambulatory Visit: Payer: Self-pay

## 2018-09-15 ENCOUNTER — Ambulatory Visit: Payer: Medicare HMO | Admitting: Podiatry

## 2018-09-15 ENCOUNTER — Telehealth: Payer: Self-pay | Admitting: *Deleted

## 2018-09-15 DIAGNOSIS — I739 Peripheral vascular disease, unspecified: Secondary | ICD-10-CM

## 2018-09-15 DIAGNOSIS — I83009 Varicose veins of unspecified lower extremity with ulcer of unspecified site: Secondary | ICD-10-CM

## 2018-09-15 DIAGNOSIS — L97909 Non-pressure chronic ulcer of unspecified part of unspecified lower leg with unspecified severity: Secondary | ICD-10-CM | POA: Diagnosis not present

## 2018-09-15 NOTE — Progress Notes (Signed)
Subjective:   Patient ID: Noah CatholicJeffrey E [name], male   DOB: 78 y.o.   MRN: [id number]   HPI 78 year old male presents to the office today for a second opinion of a wound on his left leg.  He states is been a chronic issue.  He has had wounds on the right side which she had to take skin graft placed previously.  The left side has opened up and developed wounds he is been keeping Silvadene on the area as well as has been on doxycycline for which he is currently on.  He gets concerned when the wounds open back up.  He does get swelling to his legs and he does have congestive heart failure.  Currently denies any pus but he gets clear drainage from the wounds.  He is diabetic he states his last A1c was either 7.9 or 9.7.  He states his daily blood sugars are between 120-1 40.   Review of Systems  All other systems reviewed and are negative.  Past Medical History:  Diagnosis Date  . CAD S/P percutaneous coronary angioplasty    remote PCIs in 1990's- last CFX PCI 2008  . Chronic kidney disease, stage III (moderate) (HCC)   . Diabetes mellitus with complication (HCC)    with hyperglycemia and diabetic autonomic poly neuropathy  . Gastro-esophageal reflux disease with esophagitis   . Hyperlipidemia   . Hypertension   . Ischemic cardiomyopathy 01/20/2018   St Jude ICD   . Morbid obesity (HCC)   . Obstructive sleep apnea    on CPAP  . Polyneuropathy   . Primary insomnia     Past Surgical History:  Procedure Laterality Date  . BIV ICD INSERTION CRT-D N/A 01/20/2018   Procedure: BIV ICD INSERTION CRT-D;  Surgeon: Marinus Mawaylor, Gregg W, MD;  [facility];  Service: Cardiovascular;  Laterality: N/A;  . BIV PACEMAKER INSERTION CRT-P  01/20/2018   St Jude- Dr Ladona Ridgelaylor  . CARDIAC CATHETERIZATION     multiple angioplasty X 8, 4 stents      Current Outpatient Medications:  .  acetaminophen (TYLENOL) 650 MG CR tablet, Take 1,300 mg by mouth at bedtime., Disp: , Rfl:  .  aspirin EC 81 MG  tablet, Take 1 tablet (81 mg total) by mouth daily., Disp: 90 tablet, Rfl: 3 .  benzonatate (TESSALON) 100 MG capsule, TAKE 1 CAPSULE BY MOUTH THREE TIMES A DAY AS NEEDED FOR COUGH, Disp: , Rfl:  .  cyclobenzaprine (FLEXERIL) 10 MG tablet, Take 10 mg by mouth 3 (three) times daily as needed for spasms., Disp: , Rfl: 3 .  doxycycline (VIBRA-TABS) 100 MG tablet, , Disp: , Rfl:  .  famotidine (PEPCID) 20 MG tablet, Take 20 mg by mouth at bedtime., Disp: , Rfl:  .  finasteride (PROSCAR) 5 MG tablet, Take 5 mg by mouth every evening. , Disp: , Rfl: 4 .  FLUoxetine (PROZAC) 20 MG capsule, Take 20 mg by mouth every evening., Disp: , Rfl:  .  furosemide (LASIX) 40 MG tablet, Take 2 tabs (80mg ) by mouth every morning & 1 tab (40mg ) every afternoon, Disp: 90 tablet, Rfl: 6 .  gabapentin (NEURONTIN) 100 MG capsule, Take 100 mg by mouth 2 (two) times daily. , Disp: , Rfl:  .  glipiZIDE (GLUCOTROL XL) 10 MG 24 hr tablet, Take 10 mg by mouth 2 (two) times daily., Disp: , Rfl:  .  glucose blood (ONE TOUCH ULTRA TEST) test strip, Check your sugars twice a day, Disp: 100  each, Rfl: 12 .  Insulin Glargine-Lixisenatide (SOLIQUA) 100-33 UNT-MCG/ML SOPN, Inject 44 Units into the skin every evening. , Disp: , Rfl:  .  Insulin Pen Needle (NOVOFINE) 30G X 8 MM MISC, Inject 10 each into the skin as needed., Disp: 90 each, Rfl: 3 .  ipratropium-albuterol (DUONEB) 0.5-2.5 (3) MG/3ML SOLN, INHALE 1 CONTENTS OF 1 VIAL 4 TIMES A DAY AS NEEDED FOR COUGH, Disp: , Rfl:  .  isosorbide mononitrate (IMDUR) 30 MG 24 hr tablet, Take 30 mg by mouth daily. , Disp: , Rfl: 3 .  levETIRAcetam (KEPPRA) 750 MG tablet, Take 750 mg by mouth 2 (two) times daily., Disp: , Rfl:  .  metFORMIN (GLUCOPHAGE-XR) 500 MG 24 hr tablet, Take 1,500 mg by mouth daily., Disp: , Rfl:  .  metoprolol (TOPROL-XL) 200 MG 24 hr tablet, Take 100 mg by mouth 2 (two) times daily., Disp: , Rfl:  .  Multiple Vitamin (MULTIVITAMIN) capsule, Take 1 capsule by mouth daily.  , Disp: , Rfl:  .  Omega-3 Fatty Acids (FISH OIL) 1000 MG CAPS, Take 1,000 mg by mouth 2 (two) times daily. , Disp: , Rfl:  .  omeprazole (PRILOSEC) 20 MG capsule, Take 20 mg by mouth daily., Disp: , Rfl:  .  ranitidine (ZANTAC) 150 MG tablet, Take 150 mg by mouth at bedtime., Disp: , Rfl:  .  sacubitril-valsartan (ENTRESTO) 49-51 MG, Take 1 tablet by mouth 2 (two) times daily., Disp: 60 tablet, Rfl: 6 .  silver sulfADIAZINE (SILVADENE) 1 % cream, APPLY 1 CREAM TO SKIN ONCE A DAY, Disp: , Rfl:  .  simvastatin (ZOCOR) 80 MG tablet, TAKE 80 MG (1 TABLET) DAILY. (Patient taking differently: Take 80 mg by mouth every evening. TAKE 80 MG (1 TABLET) DAILY.), Disp: 90 tablet, Rfl: 3 .  sulfamethoxazole-trimethoprim (BACTRIM DS) 800-160 MG tablet, Take 1 tablet by mouth 2 (two) times daily., Disp: , Rfl:  .  Tamsulosin HCl (FLOMAX) 0.4 MG CAPS, Take 0.8 mg by mouth every evening., Disp: , Rfl:  .  warfarin (COUMADIN) 5 MG tablet, Take 1/2 tablet daily except 1 tablet on Tuesdays, Thursdays and Saturdays (Patient taking differently: Take 1/2 tablet (2.5mg ) daily except 1 tablet (5mg ) on Tuesdays.), Disp: 45 tablet, Rfl: 3 .  zaleplon (SONATA) 5 MG capsule, TAKE 1 CAPSULE BY MOUTH EVERY NIGHT AT BEDTIME AS NEEDED FOR INSOMNIA, Disp: , Rfl:   Allergies  Allergen Reactions  . Codeine Other (See Comments)    constipation  . Erythromycin-Sulfisoxazole Other (See Comments)    Causes infection in throat and eyes         Objective:  Physical Exam  General: AAO x3, NAD  Dermatological: There are 2 larger ulcerations just proximal to the ankle joint on the left side.  The more medial wound is fibrotic measuring 2.5 x 1.8 cm with a depth of 0.3 cm.  There is a more superficial granular wound adjacent to this measuring 3.5 x 3 cm clear drainage expressed but there is no purulence.  Superficial erythema second toe as well as dorsal foot as well.  There is no purulence.  Vascular: I am not able to palpate DP  or PT pulses today.  Atrophy of the skin. There is no pain with calf compression, swelling, warmth, erythema.   Neruologic: Sensation decreased.  Musculoskeletal: No gross boney pedal deformities bilateral.   Gait: Unassisted, Nonantalgic.       Assessment:  Venous ulcerations left side, PAD      Plan:  -Treatment  options discussed including all alternatives, risks, and complications -Etiology of symptoms were discussed -He is currently on doxycycline.  Will finish this.  Order Santyl for the fibrotic wound to be applied daily.  Would continue with nonadherent dressing on the granular wound. Wrap the foot and leg with Kerlix followed by Ace bandages for light compression.  Will order arterial studies given his presence tensor circulation.  He states he is having some previously but I have no records of this.  He was last done about 1 year ago. -Monitor for any clinical signs or symptoms of infection and directed to call the office immediately should any occur or go to the ER.  RTC 1-2 weeks or sooner if needed

## 2018-09-15 NOTE — Telephone Encounter (Signed)
-----   Message from Vivi Barrack, DPM sent at 09/15/2018  2:02 PM EDT ----- Can you please order arterial studies? Also can you please order santyl. I put it on the [facility] order sheet if they can do it but if not can you please order it from the normal place?  Misty Stanley has the [facility] order sheet

## 2018-09-16 DIAGNOSIS — E782 Mixed hyperlipidemia: Secondary | ICD-10-CM | POA: Diagnosis not present

## 2018-09-16 DIAGNOSIS — E1122 Type 2 diabetes mellitus with diabetic chronic kidney disease: Secondary | ICD-10-CM | POA: Diagnosis not present

## 2018-09-16 DIAGNOSIS — I10 Essential (primary) hypertension: Secondary | ICD-10-CM | POA: Diagnosis not present

## 2018-09-18 NOTE — Telephone Encounter (Signed)
Faxed orders to [facility]. 

## 2018-09-18 NOTE — Telephone Encounter (Signed)
-----   Message from Matthew R Wagoner, DPM sent at 09/15/2018  2:02 PM EDT ----- Can you please order arterial studies? Also can you please order santyl. I put it on the [facility] order sheet if they can do it but if not can you please order it from the normal place?  Lisa has the [facility] order sheet  

## 2018-09-19 DIAGNOSIS — L97329 Non-pressure chronic ulcer of left ankle with unspecified severity: Secondary | ICD-10-CM | POA: Diagnosis not present

## 2018-09-22 ENCOUNTER — Telehealth: Payer: Self-pay | Admitting: Podiatry

## 2018-09-22 MED ORDER — COLLAGENASE 250 UNIT/GM EX OINT: 1.0000 "application " | TOPICAL_OINTMENT | Freq: Every day | CUTANEOUS | 7 refills | Status: DC

## 2018-09-22 NOTE — Telephone Encounter (Signed)
Dr. Ardelle Anton states the Melburn Popper was to come from [facility], check if pt got the [facility] dressing supplies. I called pt and he states he did get the dressing supplies, but not the cream. Dr. Ardelle Anton states order the [facility] and have pt use silvadene cream until the [place] arrives. I informed pt and he states understanding and pt asked about his dopplers. I told pt he was to have the dopplers at [facility] and he was already scheduled for 10/04/2018 at 2:00pm. Pt states he does not have that on his schedule and wanted to call to check. I gave pt [facility] [phone].

## 2018-09-22 NOTE — Addendum Note (Signed)
Addended by: Alphia Kava D on: 09/22/2018 01:33 PM   Modules accepted: Orders

## 2018-09-22 NOTE — Telephone Encounter (Signed)
Pt called stating that he was under the impression he would be prescribed a medicated wound cream and his pharmacy has not received the prescription. Pt calling to follow up on the prescription.

## 2018-09-25 ENCOUNTER — Ambulatory Visit (INDEPENDENT_AMBULATORY_CARE_PROVIDER_SITE_OTHER): Payer: Medicare HMO | Admitting: *Deleted

## 2018-09-25 DIAGNOSIS — Z5181 Encounter for therapeutic drug level monitoring: Secondary | ICD-10-CM

## 2018-09-25 DIAGNOSIS — I4891 Unspecified atrial fibrillation: Secondary | ICD-10-CM | POA: Diagnosis not present

## 2018-09-25 DIAGNOSIS — I255 Ischemic cardiomyopathy: Secondary | ICD-10-CM

## 2018-09-25 LAB — POCT INR: INR: 2.9 (ref 2.0–3.0)

## 2018-09-25 NOTE — Patient Instructions (Signed)
Continue coumadin 1/2 tablet daily except 1 tablet on Saturdays Recheck in 4 wks 

## 2018-09-29 DIAGNOSIS — G40909 Epilepsy, unspecified, not intractable, without status epilepticus: Secondary | ICD-10-CM | POA: Diagnosis not present

## 2018-09-29 DIAGNOSIS — E1159 Type 2 diabetes mellitus with other circulatory complications: Secondary | ICD-10-CM | POA: Diagnosis not present

## 2018-09-29 DIAGNOSIS — R69 Illness, unspecified: Secondary | ICD-10-CM | POA: Diagnosis not present

## 2018-09-29 DIAGNOSIS — E785 Hyperlipidemia, unspecified: Secondary | ICD-10-CM | POA: Diagnosis not present

## 2018-09-29 DIAGNOSIS — I509 Heart failure, unspecified: Secondary | ICD-10-CM | POA: Diagnosis not present

## 2018-09-29 DIAGNOSIS — Z794 Long term (current) use of insulin: Secondary | ICD-10-CM | POA: Diagnosis not present

## 2018-09-29 DIAGNOSIS — E1142 Type 2 diabetes mellitus with diabetic polyneuropathy: Secondary | ICD-10-CM | POA: Diagnosis not present

## 2018-09-29 DIAGNOSIS — I4891 Unspecified atrial fibrillation: Secondary | ICD-10-CM | POA: Diagnosis not present

## 2018-09-29 DIAGNOSIS — I110 Hypertensive heart disease with heart failure: Secondary | ICD-10-CM | POA: Diagnosis not present

## 2018-10-04 ENCOUNTER — Ambulatory Visit (HOSPITAL_COMMUNITY)
Admission: RE | Admit: 2018-10-04 | Payer: Medicare HMO | Source: Ambulatory Visit | Attending: Podiatry | Admitting: Podiatry

## 2018-10-04 ENCOUNTER — Telehealth: Payer: Self-pay | Admitting: *Deleted

## 2018-10-04 DIAGNOSIS — L97909 Non-pressure chronic ulcer of unspecified part of unspecified lower leg with unspecified severity: Secondary | ICD-10-CM

## 2018-10-04 DIAGNOSIS — I739 Peripheral vascular disease, unspecified: Secondary | ICD-10-CM

## 2018-10-04 DIAGNOSIS — I83009 Varicose veins of unspecified lower extremity with ulcer of unspecified site: Secondary | ICD-10-CM

## 2018-10-04 NOTE — Telephone Encounter (Signed)
[facility] - Jocelyn Lamer states pt called states pt cancelled scheduled arterial dopplers today due to back pain, later called and states he would like scheduled at [facility]. Jocelyn Lamer states if I will change to [facility], she will reschedule pt. I told Jocelyn Lamer I will have schedule once I hang up the phone.

## 2018-10-05 ENCOUNTER — Telehealth: Payer: Self-pay

## 2018-10-05 NOTE — Telephone Encounter (Signed)
Pre-cert Verification for the following procedure    US ARTERIAL SEG MULTIPLE scheduled 10/09/2018 at [facility]

## 2018-10-09 ENCOUNTER — Ambulatory Visit (HOSPITAL_COMMUNITY)
Admission: RE | Admit: 2018-10-09 | Discharge: 2018-10-09 | Disposition: A | Discharge status: Home / Self Care | Payer: Medicare HMO | Source: Ambulatory Visit | Attending: Podiatry | Admitting: Podiatry

## 2018-10-09 ENCOUNTER — Telehealth: Payer: Self-pay | Admitting: Podiatry

## 2018-10-09 ENCOUNTER — Other Ambulatory Visit: Payer: Self-pay | Admitting: Podiatry

## 2018-10-09 DIAGNOSIS — I83009 Varicose veins of unspecified lower extremity with ulcer of unspecified site: Secondary | ICD-10-CM | POA: Insufficient documentation

## 2018-10-09 DIAGNOSIS — I739 Peripheral vascular disease, unspecified: Secondary | ICD-10-CM | POA: Diagnosis not present

## 2018-10-09 DIAGNOSIS — L97909 Non-pressure chronic ulcer of unspecified part of unspecified lower leg with unspecified severity: Secondary | ICD-10-CM | POA: Insufficient documentation

## 2018-10-09 MED ORDER — CEPHALEXIN 500 MG PO CAPS: 500.0000 mg | ORAL_CAPSULE | Freq: Two times a day (BID) | ORAL | 0 refills | Status: DC

## 2018-10-09 NOTE — Telephone Encounter (Signed)
I called pt, he states he has 2 round holes on the the ankle filled with a gooey material, no redness and he is keeping it clean, but has a lot of drainage. I told pt I would inform Dr. Jacqualyn Posey and then transfer to schedulers.

## 2018-10-09 NOTE — Telephone Encounter (Signed)
Pt called stating that his left ankle still appears to be raw/infeceted and thinks he could benefit from another round of antibiotics. Pt requested a call from the doctors nurse

## 2018-10-09 NOTE — Telephone Encounter (Signed)
I sent antibiotics (keflex) to the pharmacy.

## 2018-10-10 ENCOUNTER — Encounter: Payer: Self-pay | Admitting: Podiatry

## 2018-10-10 ENCOUNTER — Other Ambulatory Visit: Payer: Self-pay

## 2018-10-10 ENCOUNTER — Ambulatory Visit: Payer: Medicare HMO | Admitting: Podiatry

## 2018-10-10 VITALS — Temp 97.3°F

## 2018-10-10 DIAGNOSIS — L97909 Non-pressure chronic ulcer of unspecified part of unspecified lower leg with unspecified severity: Secondary | ICD-10-CM

## 2018-10-10 DIAGNOSIS — R609 Edema, unspecified: Secondary | ICD-10-CM

## 2018-10-10 DIAGNOSIS — I83009 Varicose veins of unspecified lower extremity with ulcer of unspecified site: Secondary | ICD-10-CM

## 2018-10-10 NOTE — Telephone Encounter (Signed)
I informed pt the antibiotic was called to the pharmacy and Dr. Jacqualyn Posey would see pt today at 1:15pm.

## 2018-10-10 NOTE — Progress Notes (Signed)
  Subjective: 78 year old male presents the office today for evaluation of venous wounds.  He states that the wounds are actually much better.  He wanted to come in to discuss arterial studies.  He is been keeping Santyl to the wound on the left leg.  I received a call that he had increased redness and drainage is now starting to see him today.  When talking to him today he states that he is doing much better.  He thinks the wounds are healing. Denies any systemic complaints such as fevers, chills, nausea, vomiting. No acute changes since last appointment, and no other complaints at this time.   Objective: AAO x3, NAD DP/PT pulses palpable bilaterally, CRT less than 3 seconds On the anterior aspect the left leg there are 2 wounds.  Once more granular and the other one is fibrotic.  The fibrotic wound measures 2 x 1.5 cm.  No probing, undermining or tunneling.  Mild surrounding erythema likely more from inflammation as opposed to infection.  There is no ascending cellulitis.  There is no fluctuation crepitation malodor.  Superficial abrasion on the right anterior leg.  No drainage.  There is chronic bilateral lower extremity edema. No open lesions or pre-ulcerative lesions.  No pain with calf compression, swelling, warmth, erythema  Assessment: Chronic venous wounds  Plan: -All treatment options discussed with the patient including all alternatives, risks, complications.  -I debrided the fibrotic wound healed #312 with scalpel down to healthy tissue as much as well.  Without any complications. -Continue Santyl dressing changes on the left side.  Continue Silvadene on the right side.  Continue with Ace bandages for compression.  Discussed with cardiovascular compression therapy.  Encouraged elevation. -Patient encouraged to call the office with any questions, concerns, change in symptoms.   Trula Slade DPM

## 2018-10-14 DIAGNOSIS — I69354 Hemiplegia and hemiparesis following cerebral infarction affecting left non-dominant side: Secondary | ICD-10-CM | POA: Diagnosis not present

## 2018-10-14 DIAGNOSIS — I5023 Acute on chronic systolic (congestive) heart failure: Secondary | ICD-10-CM | POA: Diagnosis not present

## 2018-10-14 DIAGNOSIS — S066X0D Traumatic subarachnoid hemorrhage without loss of consciousness, subsequent encounter: Secondary | ICD-10-CM | POA: Diagnosis not present

## 2018-10-14 DIAGNOSIS — I69398 Other sequelae of cerebral infarction: Secondary | ICD-10-CM | POA: Diagnosis not present

## 2018-10-17 DIAGNOSIS — E782 Mixed hyperlipidemia: Secondary | ICD-10-CM | POA: Diagnosis not present

## 2018-10-17 DIAGNOSIS — I10 Essential (primary) hypertension: Secondary | ICD-10-CM | POA: Diagnosis not present

## 2018-10-18 ENCOUNTER — Telehealth: Payer: Self-pay | Admitting: Podiatry

## 2018-10-18 NOTE — Telephone Encounter (Signed)
Pt called wanting to know the status of bandages and ointment ordered last week.

## 2018-10-19 NOTE — Telephone Encounter (Signed)
Tried to call the patient and it went to a busy signal. Noah [name]

## 2018-10-23 ENCOUNTER — Telehealth: Payer: Self-pay | Admitting: *Deleted

## 2018-10-23 NOTE — Telephone Encounter (Signed)
Pt states he was to receive bandages and cream.

## 2018-10-23 NOTE — Telephone Encounter (Signed)
Patient called back on Thursday July 2nd and I stated that the supplies should be coming and to give [place] a call if you do not receive them and he gave me a call back [phone]. Lattie Haw

## 2018-10-24 ENCOUNTER — Ambulatory Visit (INDEPENDENT_AMBULATORY_CARE_PROVIDER_SITE_OTHER): Payer: Medicare HMO | Admitting: *Deleted

## 2018-10-24 ENCOUNTER — Telehealth: Payer: Self-pay | Admitting: *Deleted

## 2018-10-24 DIAGNOSIS — I252 Old myocardial infarction: Secondary | ICD-10-CM | POA: Diagnosis not present

## 2018-10-24 DIAGNOSIS — N189 Chronic kidney disease, unspecified: Secondary | ICD-10-CM | POA: Diagnosis not present

## 2018-10-24 DIAGNOSIS — R410 Disorientation, unspecified: Secondary | ICD-10-CM | POA: Diagnosis not present

## 2018-10-24 DIAGNOSIS — R4182 Altered mental status, unspecified: Secondary | ICD-10-CM | POA: Diagnosis not present

## 2018-10-24 DIAGNOSIS — I130 Hypertensive heart and chronic kidney disease with heart failure and stage 1 through stage 4 chronic kidney disease, or unspecified chronic kidney disease: Secondary | ICD-10-CM | POA: Diagnosis not present

## 2018-10-24 DIAGNOSIS — Z6841 Body Mass Index (BMI) 40.0 and over, adult: Secondary | ICD-10-CM | POA: Diagnosis not present

## 2018-10-24 DIAGNOSIS — I255 Ischemic cardiomyopathy: Secondary | ICD-10-CM | POA: Diagnosis not present

## 2018-10-24 DIAGNOSIS — E119 Type 2 diabetes mellitus without complications: Secondary | ICD-10-CM | POA: Diagnosis not present

## 2018-10-24 DIAGNOSIS — L03115 Cellulitis of right lower limb: Secondary | ICD-10-CM | POA: Diagnosis not present

## 2018-10-24 DIAGNOSIS — E1165 Type 2 diabetes mellitus with hyperglycemia: Secondary | ICD-10-CM | POA: Diagnosis not present

## 2018-10-24 DIAGNOSIS — I517 Cardiomegaly: Secondary | ICD-10-CM | POA: Diagnosis not present

## 2018-10-24 DIAGNOSIS — R0902 Hypoxemia: Secondary | ICD-10-CM | POA: Diagnosis not present

## 2018-10-24 DIAGNOSIS — Z955 Presence of coronary angioplasty implant and graft: Secondary | ICD-10-CM | POA: Diagnosis not present

## 2018-10-24 DIAGNOSIS — I10 Essential (primary) hypertension: Secondary | ICD-10-CM | POA: Diagnosis not present

## 2018-10-24 DIAGNOSIS — I5022 Chronic systolic (congestive) heart failure: Secondary | ICD-10-CM | POA: Diagnosis not present

## 2018-10-24 DIAGNOSIS — L03314 Cellulitis of groin: Secondary | ICD-10-CM | POA: Diagnosis not present

## 2018-10-24 DIAGNOSIS — L97329 Non-pressure chronic ulcer of left ankle with unspecified severity: Secondary | ICD-10-CM | POA: Diagnosis not present

## 2018-10-24 DIAGNOSIS — L03116 Cellulitis of left lower limb: Secondary | ICD-10-CM | POA: Diagnosis not present

## 2018-10-24 DIAGNOSIS — Z1159 Encounter for screening for other viral diseases: Secondary | ICD-10-CM | POA: Diagnosis not present

## 2018-10-24 LAB — CUP PACEART REMOTE DEVICE CHECK
Battery Remaining Longevity: 74 mo
Battery Remaining Percentage: 84 %
Battery Voltage: 3.01 V
Brady Statistic AP VP Percent: 1.1 %
Brady Statistic AP VS Percent: 1 %
Brady Statistic AS VP Percent: 97 %
Brady Statistic AS VS Percent: 1.5 %
Brady Statistic RA Percent Paced: 1 %
Date Time Interrogation Session: 20200707060017
HighPow Impedance: 95 Ohm
HighPow Impedance: 95 Ohm
Implantable Lead Implant Date: 20191004
Implantable Lead Implant Date: 20191004
Implantable Lead Implant Date: 20191004
Implantable Lead Location: 753858
Implantable Lead Location: 753859
Implantable Lead Location: 753860
Implantable Lead Model: 7122
Implantable Pulse Generator Implant Date: 20191004
Lead Channel Impedance Value: 400 Ohm
Lead Channel Impedance Value: 490 Ohm
Lead Channel Impedance Value: 900 Ohm
Lead Channel Pacing Threshold Amplitude: 0.75 V
Lead Channel Pacing Threshold Amplitude: 0.875 V
Lead Channel Pacing Threshold Amplitude: 1.75 V
Lead Channel Pacing Threshold Pulse Width: 0.5 ms
Lead Channel Pacing Threshold Pulse Width: 0.5 ms
Lead Channel Pacing Threshold Pulse Width: 0.5 ms
Lead Channel Sensing Intrinsic Amplitude: 12 mV
Lead Channel Sensing Intrinsic Amplitude: 2.3 mV
Lead Channel Setting Pacing Amplitude: 2 V
Lead Channel Setting Pacing Amplitude: 2 V
Lead Channel Setting Pacing Amplitude: 2.75 V
Lead Channel Setting Pacing Pulse Width: 0.5 ms
Lead Channel Setting Pacing Pulse Width: 0.5 ms
Lead Channel Setting Sensing Sensitivity: 0.5 mV
Pulse Gen Serial Number: 9810994

## 2018-10-24 NOTE — Telephone Encounter (Signed)
Called and left a message for the patient asking if the patient got his supplies from [facility] and if the santyl came and to call the [place] office at [phone] and I have spoken to the patient twice one on Wednesday and  Thursday of last week. Lattie Haw

## 2018-10-25 DIAGNOSIS — E1165 Type 2 diabetes mellitus with hyperglycemia: Secondary | ICD-10-CM | POA: Diagnosis not present

## 2018-10-25 DIAGNOSIS — L03314 Cellulitis of groin: Secondary | ICD-10-CM | POA: Diagnosis not present

## 2018-10-26 DIAGNOSIS — L97329 Non-pressure chronic ulcer of left ankle with unspecified severity: Secondary | ICD-10-CM | POA: Diagnosis not present

## 2018-10-27 NOTE — Telephone Encounter (Signed)
Patient received the supplies and I spoke with Linna Hoff from [facility] on Thursday. Lattie Haw

## 2018-10-29 DIAGNOSIS — K219 Gastro-esophageal reflux disease without esophagitis: Secondary | ICD-10-CM | POA: Diagnosis not present

## 2018-10-29 DIAGNOSIS — I509 Heart failure, unspecified: Secondary | ICD-10-CM | POA: Diagnosis not present

## 2018-10-29 DIAGNOSIS — M1990 Unspecified osteoarthritis, unspecified site: Secondary | ICD-10-CM | POA: Diagnosis not present

## 2018-10-29 DIAGNOSIS — G4730 Sleep apnea, unspecified: Secondary | ICD-10-CM | POA: Diagnosis not present

## 2018-10-29 DIAGNOSIS — E1165 Type 2 diabetes mellitus with hyperglycemia: Secondary | ICD-10-CM | POA: Diagnosis not present

## 2018-10-29 DIAGNOSIS — Z7982 Long term (current) use of aspirin: Secondary | ICD-10-CM | POA: Diagnosis not present

## 2018-10-29 DIAGNOSIS — Z7901 Long term (current) use of anticoagulants: Secondary | ICD-10-CM | POA: Diagnosis not present

## 2018-10-29 DIAGNOSIS — I252 Old myocardial infarction: Secondary | ICD-10-CM | POA: Diagnosis not present

## 2018-10-29 DIAGNOSIS — Z794 Long term (current) use of insulin: Secondary | ICD-10-CM | POA: Diagnosis not present

## 2018-10-29 DIAGNOSIS — Z79899 Other long term (current) drug therapy: Secondary | ICD-10-CM | POA: Diagnosis not present

## 2018-10-30 ENCOUNTER — Encounter: Payer: Self-pay | Admitting: Cardiovascular Disease

## 2018-10-30 ENCOUNTER — Ambulatory Visit (INDEPENDENT_AMBULATORY_CARE_PROVIDER_SITE_OTHER): Payer: Medicare HMO | Admitting: *Deleted

## 2018-10-30 DIAGNOSIS — K210 Gastro-esophageal reflux disease with esophagitis: Secondary | ICD-10-CM | POA: Diagnosis not present

## 2018-10-30 DIAGNOSIS — N183 Chronic kidney disease, stage 3 (moderate): Secondary | ICD-10-CM | POA: Diagnosis not present

## 2018-10-30 DIAGNOSIS — Z5181 Encounter for therapeutic drug level monitoring: Secondary | ICD-10-CM | POA: Diagnosis not present

## 2018-10-30 DIAGNOSIS — E782 Mixed hyperlipidemia: Secondary | ICD-10-CM | POA: Diagnosis not present

## 2018-10-30 DIAGNOSIS — I4891 Unspecified atrial fibrillation: Secondary | ICD-10-CM

## 2018-10-30 DIAGNOSIS — E1142 Type 2 diabetes mellitus with diabetic polyneuropathy: Secondary | ICD-10-CM | POA: Diagnosis not present

## 2018-10-30 DIAGNOSIS — E7800 Pure hypercholesterolemia, unspecified: Secondary | ICD-10-CM | POA: Diagnosis not present

## 2018-10-30 DIAGNOSIS — I10 Essential (primary) hypertension: Secondary | ICD-10-CM | POA: Diagnosis not present

## 2018-10-30 DIAGNOSIS — I255 Ischemic cardiomyopathy: Secondary | ICD-10-CM

## 2018-10-30 DIAGNOSIS — R0902 Hypoxemia: Secondary | ICD-10-CM | POA: Diagnosis not present

## 2018-10-30 LAB — POCT INR: INR: 1.8 — AB (ref 2.0–3.0)

## 2018-10-30 NOTE — Patient Instructions (Signed)
Take 1 tablet tonight then resume 1/2 tablet daily except 1 tablet on Saturdays Recheck in 4 wks

## 2018-10-31 ENCOUNTER — Ambulatory Visit: Payer: Medicare HMO | Admitting: Podiatry

## 2018-10-31 DIAGNOSIS — R69 Illness, unspecified: Secondary | ICD-10-CM | POA: Diagnosis not present

## 2018-11-04 ENCOUNTER — Encounter: Payer: Self-pay | Admitting: Cardiology

## 2018-11-04 NOTE — Progress Notes (Signed)
Remote ICD transmission.   

## 2018-11-07 DIAGNOSIS — Z794 Long term (current) use of insulin: Secondary | ICD-10-CM | POA: Diagnosis not present

## 2018-11-07 DIAGNOSIS — N183 Chronic kidney disease, stage 3 (moderate): Secondary | ICD-10-CM | POA: Diagnosis not present

## 2018-11-07 DIAGNOSIS — Z7901 Long term (current) use of anticoagulants: Secondary | ICD-10-CM | POA: Diagnosis not present

## 2018-11-07 DIAGNOSIS — L03116 Cellulitis of left lower limb: Secondary | ICD-10-CM | POA: Diagnosis not present

## 2018-11-07 DIAGNOSIS — I5022 Chronic systolic (congestive) heart failure: Secondary | ICD-10-CM | POA: Diagnosis not present

## 2018-11-07 DIAGNOSIS — I110 Hypertensive heart disease with heart failure: Secondary | ICD-10-CM | POA: Diagnosis not present

## 2018-11-07 DIAGNOSIS — I249 Acute ischemic heart disease, unspecified: Secondary | ICD-10-CM | POA: Diagnosis not present

## 2018-11-07 DIAGNOSIS — E1143 Type 2 diabetes mellitus with diabetic autonomic (poly)neuropathy: Secondary | ICD-10-CM | POA: Diagnosis not present

## 2018-11-07 DIAGNOSIS — E1122 Type 2 diabetes mellitus with diabetic chronic kidney disease: Secondary | ICD-10-CM | POA: Diagnosis not present

## 2018-11-07 DIAGNOSIS — E1165 Type 2 diabetes mellitus with hyperglycemia: Secondary | ICD-10-CM | POA: Diagnosis not present

## 2018-11-14 ENCOUNTER — Other Ambulatory Visit: Payer: Self-pay

## 2018-11-14 ENCOUNTER — Encounter: Payer: Self-pay | Admitting: Cardiovascular Disease

## 2018-11-14 ENCOUNTER — Ambulatory Visit (INDEPENDENT_AMBULATORY_CARE_PROVIDER_SITE_OTHER): Payer: Medicare HMO | Admitting: Cardiovascular Disease

## 2018-11-14 VITALS — BP 148/80 | HR 76 | Temp 97.8°F | Wt 280.0 lb

## 2018-11-14 DIAGNOSIS — Z955 Presence of coronary angioplasty implant and graft: Secondary | ICD-10-CM | POA: Diagnosis not present

## 2018-11-14 DIAGNOSIS — I10 Essential (primary) hypertension: Secondary | ICD-10-CM

## 2018-11-14 DIAGNOSIS — Z95810 Presence of automatic (implantable) cardiac defibrillator: Secondary | ICD-10-CM | POA: Diagnosis not present

## 2018-11-14 DIAGNOSIS — I480 Paroxysmal atrial fibrillation: Secondary | ICD-10-CM | POA: Diagnosis not present

## 2018-11-14 DIAGNOSIS — I5042 Chronic combined systolic (congestive) and diastolic (congestive) heart failure: Secondary | ICD-10-CM | POA: Diagnosis not present

## 2018-11-14 DIAGNOSIS — N183 Chronic kidney disease, stage 3 unspecified: Secondary | ICD-10-CM

## 2018-11-14 DIAGNOSIS — E785 Hyperlipidemia, unspecified: Secondary | ICD-10-CM

## 2018-11-14 NOTE — Progress Notes (Signed)
SUBJECTIVE: The patient presents for routine follow-up.  He underwent [employer] Jude biventricular ICD implantation on 01/20/2018. He has coronary artery disease and chronic systolic heart failure with prior stent placement.  He underwent coronary angiography on 05/30/2006 and underwent percutaneous transluminal coronary angioplasty and drug-eluting stent placement to the circumflex. 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  Echocardiogram  on 05/18/2018 demonstrated an improvement in left ventricular systolic function, LVEF up to 40 to 45%.  There was moderate LVH.  Wall motion abnormalities were noted.  Nuclear stress test on 10/14/2017 showed a large prior inferior myocardial infarction. There was no significant ischemia.  ABIs in June 2020 demonstrated left-sided tibial disease.  He said he feels better overall since his last visit with me.  He denies chest pain and shortness of breath.  Leg swelling has improved.  He denies palpitations.   Review of Systems: As per "subjective", otherwise negative.  Allergies  Allergen Reactions  . Codeine Other (See Comments)    constipation  . Erythromycin-Sulfisoxazole Other (See Comments)    Causes infection in throat and eyes    Current Outpatient Medications  Medication Sig Dispense Refill  . acetaminophen (TYLENOL) 650 MG CR tablet Take 1,300 mg by mouth at bedtime.    Marland Kitchen aspirin EC 81 MG tablet Take 1 tablet (81 mg total) by mouth daily. 90 tablet 3  . benzonatate (TESSALON) 100 MG capsule TAKE 1 CAPSULE BY MOUTH THREE TIMES A DAY AS NEEDED FOR COUGH    . collagenase (SANTYL) ointment Apply 1 application topically daily. Wound measurements left ankle - 2.5 x 1.8 x 0.3cm, and 3.5 x 3.0 x 0.3cm 30 g 7  . cyclobenzaprine (FLEXERIL) 10 MG tablet Take 10 mg by mouth 3 (three) times daily as needed for spasms.  3  . famotidine (PEPCID) 20 MG tablet Take 20 mg  by mouth at bedtime.    . finasteride (PROSCAR) 5 MG tablet Take 5 mg by mouth every evening.   4  . FLUoxetine (PROZAC) 20 MG capsule Take 20 mg by mouth every evening.    . furosemide (LASIX) 40 MG tablet Take 2 tabs (80mg ) by mouth every morning & 1 tab (40mg ) every afternoon 90 tablet 6  . gabapentin (NEURONTIN) 100 MG capsule Take 100 mg by mouth 2 (two) times daily.     Marland Kitchen glipiZIDE (GLUCOTROL XL) 10 MG 24 hr tablet Take 10 mg by mouth 2 (two) times daily.    Marland Kitchen glucose blood (ONE TOUCH ULTRA TEST) test strip Check your sugars twice a day 100 each 12  . Insulin Glargine-Lixisenatide (SOLIQUA) 100-33 UNT-MCG/ML SOPN Inject 44 Units into the skin every evening.     . Insulin Pen Needle (NOVOFINE) 30G X 8 MM MISC Inject 10 each into the skin as needed. 90 each 3  . ipratropium-albuterol (DUONEB) 0.5-2.5 (3) MG/3ML SOLN INHALE 1 CONTENTS OF 1 VIAL 4 TIMES A DAY AS NEEDED FOR COUGH    . isosorbide mononitrate (IMDUR) 30 MG 24 hr tablet Take 30 mg by mouth daily.   3  . levETIRAcetam (KEPPRA) 750 MG tablet Take 750 mg by mouth 2 (two) times daily.    . metFORMIN (GLUCOPHAGE-XR) 500 MG 24 hr tablet Take 1,500 mg by mouth daily.    . metoprolol (TOPROL-XL) 200 MG 24 hr tablet Take 100 mg by mouth 2 (two) times daily.    . Multiple  Vitamin (MULTIVITAMIN) capsule Take 1 capsule by mouth daily.     . Omega-3 Fatty Acids (FISH OIL) 1000 MG CAPS Take 1,000 mg by mouth 2 (two) times daily.     Marland Kitchen. omeprazole (PRILOSEC) 20 MG capsule Take 20 mg by mouth daily.    . ranitidine (ZANTAC) 150 MG tablet Take 150 mg by mouth at bedtime.    . sacubitril-valsartan (ENTRESTO) 49-51 MG Take 1 tablet by mouth 2 (two) times daily. 60 tablet 6  . silver sulfADIAZINE (SILVADENE) 1 % cream APPLY 1 CREAM TO SKIN ONCE A DAY    . simvastatin (ZOCOR) 80 MG tablet TAKE 80 MG (1 TABLET) DAILY. (Patient taking differently: Take 80 mg by mouth every evening. TAKE 80 MG (1 TABLET) DAILY.) 90 tablet 3  . Tamsulosin HCl (FLOMAX) 0.4  MG CAPS Take 0.8 mg by mouth every evening.    . warfarin (COUMADIN) 5 MG tablet Take 1/2 tablet daily except 1 tablet on Tuesdays, Thursdays and Saturdays (Patient taking differently: Take 1/2 tablet (2.5mg ) daily except 1 tablet (5mg ) on Tuesdays.) 45 tablet 3   No current facility-administered medications for this visit.     Past Medical History:  Diagnosis Date  . CAD S/P percutaneous coronary angioplasty    remote PCIs in 1990's- last CFX PCI 2008  . Chronic kidney disease, stage III (moderate) (HCC)   . Diabetes mellitus with complication (HCC)    with hyperglycemia and diabetic autonomic poly neuropathy  . Gastro-esophageal reflux disease with esophagitis   . Hyperlipidemia   . Hypertension   . Ischemic cardiomyopathy 01/20/2018   St Jude ICD   . Morbid obesity (HCC)   . Obstructive sleep apnea    on CPAP  . Polyneuropathy   . Primary insomnia     Past Surgical History:  Procedure Laterality Date  . BIV ICD INSERTION CRT-D N/A 01/20/2018   Procedure: BIV ICD INSERTION CRT-D;  Surgeon: Marinus Mawaylor, Gregg W, MD;  [facility];  Service: Cardiovascular;  Laterality: N/A;  . BIV PACEMAKER INSERTION CRT-P  01/20/2018   St Jude- Dr Ladona Ridgelaylor  . CARDIAC CATHETERIZATION     multiple angioplasty X 8, 4 stents     Social History   Socioeconomic History  . Marital status: Married    Spouse name: Not on file  . Number of children: Not on file  . Years of education: Not on file  . Highest education level: Not on file  Occupational History  . Occupation: Retired  Engineer, productionocial Needs  . Financial resource strain: Not on file  . Food insecurity    Worry: Not on file    Inability: Not on file  . Transportation needs    Medical: Not on file    Non-medical: Not on file  Tobacco Use  . Smoking status: Former Smoker    Packs/day: 1.00    Years: 30.00    Pack years: 30.00    Quit date: 09/01/2000    Years since quitting: 18.2  . Smokeless tobacco: Never Used  Substance  and Sexual Activity  . Alcohol use: Yes    Alcohol/week: 0.0 standard drinks  . Drug use: No  . Sexual activity: Not on file  Lifestyle  . Physical activity    Days per week: Not on file    Minutes per session: Not on file  . Stress: Not on file  Relationships  . Social Musicianconnections    Talks on phone: Not on file    Gets together: Not  on file    Attends religious service: Not on file    Active member of club or organization: Not on file    Attends meetings of clubs or organizations: Not on file    Relationship status: Not on file  . Intimate partner violence    Fear of current or ex partner: Not on file    Emotionally abused: Not on file    Physically abused: Not on file    Forced sexual activity: Not on file  Other Topics Concern  . Not on file  Social History Narrative  . Not on file     Vitals:   11/14/18 1508  BP: (!) 148/80  Pulse: 76  Temp: 97.8 F (36.6 C)  SpO2: 96%  Weight: 280 lb (127 kg)    Wt Readings from Last 3 Encounters:  11/14/18 280 lb (127 kg)  05/23/18 281 lb 8 oz (127.7 kg)  04/25/18 289 lb (131.1 kg)     PHYSICAL EXAM General: NAD HEENT: Normal. Neck: No JVD, no thyromegaly. Lungs: Clear to auscultation bilaterally with normal respiratory effort. CV: Regular rate and rhythm, normal S1/S2, no S3/S4, no murmur.  Venous varicosities bilaterally, chronic 1+ bilateral lower extremity edema.     Abdomen: Soft, nontender, obese.  Neurologic: Alert and oriented.  Psych: Normal affect. Skin: Normal. Musculoskeletal: No gross deformities.    ECG: Reviewed above under Subjective   Labs:    Lipids: Lab Results  Component Value Date/Time   LDLCALC  09/09/2007 03:00 AM    75        Total Cholesterol/HDL:CHD Risk Coronary Heart Disease Risk Table                     Men   Women  1/2 Average Risk   3.4   3.3   CHOL  09/09/2007 03:00 AM    121        ATP III CLASSIFICATION:  <200     mg/dL   Desirable  597-416  mg/dL   Borderline  High  >=384    mg/dL   High   TRIG 92 53/64/6803 03:00 AM   HDL 28 (L) 09/09/2007 03:00 AM       ASSESSMENT AND PLAN: 1. Coronary artery disease: History of inferior wall MI and multivessel stenting.Symptomatically stable.Currently on aspirin81mg , Imdur, Entresto, Toprol-XL, and simvastatin80 mg. Nuclear stress test reviewed above with no evidence of ischemia and large area of inferior myocardial scar.  2. Hypertension: Blood pressure is mildly elevated.  Reportedly normal at PCPs office.   This needs continued monitoring.  3. Hyperlipidemia: I will obtain a copy of most recent lipids from PCP.  Continue simvastatin 80 mg.  4. 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 Continue Lasix 80 mg every morning and 40 mg every afternoon.   5. Morbid obesity: He needs significant weight loss. Historically, he has not adhered to a low-sodium, low-fat diet.  6.  Paroxysmal atrial fibrillation: Anticoagulated with warfarin. Currently on Toprol-XL for rate control.  7.  Chronic kidney disease stage III: BUN 40, creatinine 1.55 on 05/23/2018.   Disposition: Follow up 6 months   Prentice Docker, M.D., F.A.C.C.

## 2018-11-14 NOTE — Patient Instructions (Signed)

## 2018-11-15 ENCOUNTER — Other Ambulatory Visit: Payer: Self-pay | Admitting: *Deleted

## 2018-11-15 MED ORDER — SIMVASTATIN 80 MG PO TABS: ORAL_TABLET | ORAL | 3 refills | Status: DC

## 2018-11-17 ENCOUNTER — Encounter: Payer: Self-pay | Admitting: *Deleted

## 2018-11-17 DIAGNOSIS — E7800 Pure hypercholesterolemia, unspecified: Secondary | ICD-10-CM | POA: Diagnosis not present

## 2018-11-17 DIAGNOSIS — I10 Essential (primary) hypertension: Secondary | ICD-10-CM | POA: Diagnosis not present

## 2018-11-21 DIAGNOSIS — L03116 Cellulitis of left lower limb: Secondary | ICD-10-CM | POA: Diagnosis not present

## 2018-11-21 DIAGNOSIS — I249 Acute ischemic heart disease, unspecified: Secondary | ICD-10-CM | POA: Diagnosis not present

## 2018-11-21 DIAGNOSIS — E1122 Type 2 diabetes mellitus with diabetic chronic kidney disease: Secondary | ICD-10-CM | POA: Diagnosis not present

## 2018-11-21 DIAGNOSIS — E1143 Type 2 diabetes mellitus with diabetic autonomic (poly)neuropathy: Secondary | ICD-10-CM | POA: Diagnosis not present

## 2018-11-21 DIAGNOSIS — I110 Hypertensive heart disease with heart failure: Secondary | ICD-10-CM | POA: Diagnosis not present

## 2018-11-21 DIAGNOSIS — Z794 Long term (current) use of insulin: Secondary | ICD-10-CM | POA: Diagnosis not present

## 2018-11-21 DIAGNOSIS — Z7901 Long term (current) use of anticoagulants: Secondary | ICD-10-CM | POA: Diagnosis not present

## 2018-11-21 DIAGNOSIS — N183 Chronic kidney disease, stage 3 (moderate): Secondary | ICD-10-CM | POA: Diagnosis not present

## 2018-11-21 DIAGNOSIS — E1165 Type 2 diabetes mellitus with hyperglycemia: Secondary | ICD-10-CM | POA: Diagnosis not present

## 2018-11-21 DIAGNOSIS — I5022 Chronic systolic (congestive) heart failure: Secondary | ICD-10-CM | POA: Diagnosis not present

## 2018-11-23 ENCOUNTER — Other Ambulatory Visit: Payer: Self-pay

## 2018-11-23 ENCOUNTER — Ambulatory Visit (INDEPENDENT_AMBULATORY_CARE_PROVIDER_SITE_OTHER): Payer: Medicare HMO | Admitting: *Deleted

## 2018-11-23 DIAGNOSIS — Z5181 Encounter for therapeutic drug level monitoring: Secondary | ICD-10-CM | POA: Diagnosis not present

## 2018-11-23 DIAGNOSIS — I255 Ischemic cardiomyopathy: Secondary | ICD-10-CM

## 2018-11-23 DIAGNOSIS — I4891 Unspecified atrial fibrillation: Secondary | ICD-10-CM | POA: Diagnosis not present

## 2018-11-23 LAB — POCT INR: INR: 3.3 — AB (ref 2.0–3.0)

## 2018-11-23 NOTE — Patient Instructions (Addendum)
Hold coumadin tomorrow then resume 1/2 tablet daily except 1 tablet on Saturdays Recheck in 4 wks

## 2018-11-24 DIAGNOSIS — I110 Hypertensive heart disease with heart failure: Secondary | ICD-10-CM | POA: Diagnosis not present

## 2018-11-24 DIAGNOSIS — L03116 Cellulitis of left lower limb: Secondary | ICD-10-CM | POA: Diagnosis not present

## 2018-11-24 DIAGNOSIS — Z794 Long term (current) use of insulin: Secondary | ICD-10-CM | POA: Diagnosis not present

## 2018-11-24 DIAGNOSIS — I5022 Chronic systolic (congestive) heart failure: Secondary | ICD-10-CM | POA: Diagnosis not present

## 2018-11-24 DIAGNOSIS — I249 Acute ischemic heart disease, unspecified: Secondary | ICD-10-CM | POA: Diagnosis not present

## 2018-11-24 DIAGNOSIS — E1143 Type 2 diabetes mellitus with diabetic autonomic (poly)neuropathy: Secondary | ICD-10-CM | POA: Diagnosis not present

## 2018-11-24 DIAGNOSIS — E1165 Type 2 diabetes mellitus with hyperglycemia: Secondary | ICD-10-CM | POA: Diagnosis not present

## 2018-11-24 DIAGNOSIS — E1122 Type 2 diabetes mellitus with diabetic chronic kidney disease: Secondary | ICD-10-CM | POA: Diagnosis not present

## 2018-11-24 DIAGNOSIS — Z7901 Long term (current) use of anticoagulants: Secondary | ICD-10-CM | POA: Diagnosis not present

## 2018-11-24 DIAGNOSIS — N183 Chronic kidney disease, stage 3 (moderate): Secondary | ICD-10-CM | POA: Diagnosis not present

## 2018-11-27 DIAGNOSIS — L03119 Cellulitis of unspecified part of limb: Secondary | ICD-10-CM | POA: Diagnosis not present

## 2018-11-27 DIAGNOSIS — I10 Essential (primary) hypertension: Secondary | ICD-10-CM | POA: Diagnosis not present

## 2018-11-27 DIAGNOSIS — E1122 Type 2 diabetes mellitus with diabetic chronic kidney disease: Secondary | ICD-10-CM | POA: Diagnosis not present

## 2018-11-27 DIAGNOSIS — I5022 Chronic systolic (congestive) heart failure: Secondary | ICD-10-CM | POA: Diagnosis not present

## 2018-11-27 DIAGNOSIS — E1165 Type 2 diabetes mellitus with hyperglycemia: Secondary | ICD-10-CM | POA: Diagnosis not present

## 2018-11-27 DIAGNOSIS — E1143 Type 2 diabetes mellitus with diabetic autonomic (poly)neuropathy: Secondary | ICD-10-CM | POA: Diagnosis not present

## 2018-11-27 DIAGNOSIS — I259 Chronic ischemic heart disease, unspecified: Secondary | ICD-10-CM | POA: Diagnosis not present

## 2018-11-27 DIAGNOSIS — R410 Disorientation, unspecified: Secondary | ICD-10-CM | POA: Diagnosis not present

## 2018-12-05 DIAGNOSIS — E1165 Type 2 diabetes mellitus with hyperglycemia: Secondary | ICD-10-CM | POA: Diagnosis not present

## 2018-12-05 DIAGNOSIS — I5023 Acute on chronic systolic (congestive) heart failure: Secondary | ICD-10-CM | POA: Diagnosis not present

## 2018-12-05 DIAGNOSIS — N183 Chronic kidney disease, stage 3 (moderate): Secondary | ICD-10-CM | POA: Diagnosis not present

## 2018-12-05 DIAGNOSIS — I10 Essential (primary) hypertension: Secondary | ICD-10-CM | POA: Diagnosis not present

## 2018-12-05 DIAGNOSIS — Z6841 Body Mass Index (BMI) 40.0 and over, adult: Secondary | ICD-10-CM | POA: Diagnosis not present

## 2018-12-05 DIAGNOSIS — E1142 Type 2 diabetes mellitus with diabetic polyneuropathy: Secondary | ICD-10-CM | POA: Diagnosis not present

## 2018-12-05 DIAGNOSIS — I259 Chronic ischemic heart disease, unspecified: Secondary | ICD-10-CM | POA: Diagnosis not present

## 2018-12-05 DIAGNOSIS — N401 Enlarged prostate with lower urinary tract symptoms: Secondary | ICD-10-CM | POA: Diagnosis not present

## 2018-12-20 ENCOUNTER — Other Ambulatory Visit: Payer: Self-pay

## 2018-12-20 ENCOUNTER — Ambulatory Visit (INDEPENDENT_AMBULATORY_CARE_PROVIDER_SITE_OTHER): Payer: Medicare HMO | Admitting: *Deleted

## 2018-12-20 DIAGNOSIS — Z5181 Encounter for therapeutic drug level monitoring: Secondary | ICD-10-CM

## 2018-12-20 DIAGNOSIS — I4891 Unspecified atrial fibrillation: Secondary | ICD-10-CM

## 2018-12-20 DIAGNOSIS — I255 Ischemic cardiomyopathy: Secondary | ICD-10-CM

## 2018-12-20 LAB — POCT INR: INR: 2.9 (ref 2.0–3.0)

## 2018-12-20 NOTE — Patient Instructions (Signed)
Continue coumadin 1/2 tablet daily except 1 tablet on Saturdays Recheck in 4 wks

## 2019-01-10 DIAGNOSIS — I10 Essential (primary) hypertension: Secondary | ICD-10-CM | POA: Diagnosis not present

## 2019-01-10 DIAGNOSIS — Z1389 Encounter for screening for other disorder: Secondary | ICD-10-CM | POA: Diagnosis not present

## 2019-01-10 DIAGNOSIS — I5023 Acute on chronic systolic (congestive) heart failure: Secondary | ICD-10-CM | POA: Diagnosis not present

## 2019-01-10 DIAGNOSIS — I259 Chronic ischemic heart disease, unspecified: Secondary | ICD-10-CM | POA: Diagnosis not present

## 2019-01-10 DIAGNOSIS — Z23 Encounter for immunization: Secondary | ICD-10-CM | POA: Diagnosis not present

## 2019-01-10 DIAGNOSIS — Z1331 Encounter for screening for depression: Secondary | ICD-10-CM | POA: Diagnosis not present

## 2019-01-10 DIAGNOSIS — N401 Enlarged prostate with lower urinary tract symptoms: Secondary | ICD-10-CM | POA: Diagnosis not present

## 2019-01-10 DIAGNOSIS — Z6841 Body Mass Index (BMI) 40.0 and over, adult: Secondary | ICD-10-CM | POA: Diagnosis not present

## 2019-01-17 DIAGNOSIS — I10 Essential (primary) hypertension: Secondary | ICD-10-CM | POA: Diagnosis not present

## 2019-01-17 DIAGNOSIS — E1165 Type 2 diabetes mellitus with hyperglycemia: Secondary | ICD-10-CM | POA: Diagnosis not present

## 2019-01-19 DIAGNOSIS — G4733 Obstructive sleep apnea (adult) (pediatric): Secondary | ICD-10-CM | POA: Diagnosis not present

## 2019-01-23 ENCOUNTER — Ambulatory Visit (INDEPENDENT_AMBULATORY_CARE_PROVIDER_SITE_OTHER): Payer: Medicare HMO | Admitting: *Deleted

## 2019-01-23 DIAGNOSIS — I4891 Unspecified atrial fibrillation: Secondary | ICD-10-CM

## 2019-01-23 DIAGNOSIS — I255 Ischemic cardiomyopathy: Secondary | ICD-10-CM

## 2019-01-24 LAB — CUP PACEART REMOTE DEVICE CHECK
Battery Remaining Longevity: 71 mo
Battery Remaining Percentage: 81 %
Battery Voltage: 2.99 V
Brady Statistic AP VP Percent: 1 %
Brady Statistic AP VS Percent: 1 %
Brady Statistic AS VP Percent: 97 %
Brady Statistic AS VS Percent: 1.2 %
Brady Statistic RA Percent Paced: 1 %
Date Time Interrogation Session: 20201006060016
HighPow Impedance: 87 Ohm
HighPow Impedance: 87 Ohm
Implantable Lead Implant Date: 20191004
Implantable Lead Implant Date: 20191004
Implantable Lead Implant Date: 20191004
Implantable Lead Location: 753858
Implantable Lead Location: 753859
Implantable Lead Location: 753860
Implantable Lead Model: 7122
Implantable Pulse Generator Implant Date: 20191004
Lead Channel Impedance Value: 400 Ohm
Lead Channel Impedance Value: 460 Ohm
Lead Channel Impedance Value: 840 Ohm
Lead Channel Pacing Threshold Amplitude: 0.75 V
Lead Channel Pacing Threshold Amplitude: 0.75 V
Lead Channel Pacing Threshold Amplitude: 1.5 V
Lead Channel Pacing Threshold Pulse Width: 0.5 ms
Lead Channel Pacing Threshold Pulse Width: 0.5 ms
Lead Channel Pacing Threshold Pulse Width: 0.5 ms
Lead Channel Sensing Intrinsic Amplitude: 1.7 mV
Lead Channel Sensing Intrinsic Amplitude: 12 mV
Lead Channel Setting Pacing Amplitude: 2 V
Lead Channel Setting Pacing Amplitude: 2 V
Lead Channel Setting Pacing Amplitude: 2.5 V
Lead Channel Setting Pacing Pulse Width: 0.5 ms
Lead Channel Setting Pacing Pulse Width: 0.5 ms
Lead Channel Setting Sensing Sensitivity: 0.5 mV
Pulse Gen Serial Number: 9810994

## 2019-01-25 DIAGNOSIS — I10 Essential (primary) hypertension: Secondary | ICD-10-CM | POA: Diagnosis not present

## 2019-01-25 DIAGNOSIS — R0902 Hypoxemia: Secondary | ICD-10-CM | POA: Diagnosis not present

## 2019-01-25 DIAGNOSIS — W19XXXA Unspecified fall, initial encounter: Secondary | ICD-10-CM | POA: Diagnosis not present

## 2019-02-01 ENCOUNTER — Other Ambulatory Visit: Payer: Self-pay

## 2019-02-01 ENCOUNTER — Ambulatory Visit (INDEPENDENT_AMBULATORY_CARE_PROVIDER_SITE_OTHER): Payer: Medicare HMO | Admitting: *Deleted

## 2019-02-01 DIAGNOSIS — Z5181 Encounter for therapeutic drug level monitoring: Secondary | ICD-10-CM | POA: Diagnosis not present

## 2019-02-01 DIAGNOSIS — I4891 Unspecified atrial fibrillation: Secondary | ICD-10-CM | POA: Diagnosis not present

## 2019-02-01 DIAGNOSIS — I255 Ischemic cardiomyopathy: Secondary | ICD-10-CM

## 2019-02-01 LAB — POCT INR: INR: 3.5 — AB (ref 2.0–3.0)

## 2019-02-01 NOTE — Patient Instructions (Signed)
Hold coumadin tonight then resume 1/2 tablet daily except 1 tablet on Saturdays Recheck in 4 wks

## 2019-02-01 NOTE — Progress Notes (Signed)
Remote ICD transmission.   

## 2019-02-16 DIAGNOSIS — D649 Anemia, unspecified: Secondary | ICD-10-CM | POA: Diagnosis not present

## 2019-02-16 DIAGNOSIS — I252 Old myocardial infarction: Secondary | ICD-10-CM | POA: Diagnosis not present

## 2019-02-16 DIAGNOSIS — I4891 Unspecified atrial fibrillation: Secondary | ICD-10-CM | POA: Diagnosis not present

## 2019-02-16 DIAGNOSIS — Z20828 Contact with and (suspected) exposure to other viral communicable diseases: Secondary | ICD-10-CM | POA: Diagnosis not present

## 2019-02-16 DIAGNOSIS — E1122 Type 2 diabetes mellitus with diabetic chronic kidney disease: Secondary | ICD-10-CM | POA: Diagnosis not present

## 2019-02-16 DIAGNOSIS — R7989 Other specified abnormal findings of blood chemistry: Secondary | ICD-10-CM | POA: Diagnosis not present

## 2019-02-16 DIAGNOSIS — Z6841 Body Mass Index (BMI) 40.0 and over, adult: Secondary | ICD-10-CM | POA: Diagnosis not present

## 2019-02-16 DIAGNOSIS — R0600 Dyspnea, unspecified: Secondary | ICD-10-CM | POA: Diagnosis not present

## 2019-02-16 DIAGNOSIS — R0602 Shortness of breath: Secondary | ICD-10-CM | POA: Diagnosis not present

## 2019-02-16 DIAGNOSIS — I130 Hypertensive heart and chronic kidney disease with heart failure and stage 1 through stage 4 chronic kidney disease, or unspecified chronic kidney disease: Secondary | ICD-10-CM | POA: Diagnosis not present

## 2019-02-16 DIAGNOSIS — I509 Heart failure, unspecified: Secondary | ICD-10-CM | POA: Diagnosis not present

## 2019-02-16 DIAGNOSIS — N183 Chronic kidney disease, stage 3 unspecified: Secondary | ICD-10-CM | POA: Diagnosis not present

## 2019-02-16 DIAGNOSIS — J449 Chronic obstructive pulmonary disease, unspecified: Secondary | ICD-10-CM | POA: Diagnosis not present

## 2019-02-16 DIAGNOSIS — I2510 Atherosclerotic heart disease of native coronary artery without angina pectoris: Secondary | ICD-10-CM | POA: Diagnosis not present

## 2019-02-16 DIAGNOSIS — I5023 Acute on chronic systolic (congestive) heart failure: Secondary | ICD-10-CM | POA: Diagnosis not present

## 2019-02-16 DIAGNOSIS — R0902 Hypoxemia: Secondary | ICD-10-CM | POA: Diagnosis not present

## 2019-02-17 DIAGNOSIS — R7989 Other specified abnormal findings of blood chemistry: Secondary | ICD-10-CM | POA: Diagnosis not present

## 2019-02-17 DIAGNOSIS — I509 Heart failure, unspecified: Secondary | ICD-10-CM | POA: Diagnosis not present

## 2019-02-17 DIAGNOSIS — R0600 Dyspnea, unspecified: Secondary | ICD-10-CM | POA: Diagnosis not present

## 2019-02-19 ENCOUNTER — Encounter: Payer: Self-pay | Admitting: Cardiovascular Disease

## 2019-02-22 ENCOUNTER — Ambulatory Visit (INDEPENDENT_AMBULATORY_CARE_PROVIDER_SITE_OTHER): Payer: Medicare HMO | Admitting: Student

## 2019-02-22 ENCOUNTER — Encounter: Payer: Self-pay | Admitting: Student

## 2019-02-22 ENCOUNTER — Other Ambulatory Visit: Payer: Self-pay

## 2019-02-22 VITALS — BP 138/74 | HR 84 | Temp 98.3°F | Ht 70.0 in | Wt 287.0 lb

## 2019-02-22 DIAGNOSIS — E785 Hyperlipidemia, unspecified: Secondary | ICD-10-CM | POA: Diagnosis not present

## 2019-02-22 DIAGNOSIS — I10 Essential (primary) hypertension: Secondary | ICD-10-CM | POA: Diagnosis not present

## 2019-02-22 DIAGNOSIS — G4733 Obstructive sleep apnea (adult) (pediatric): Secondary | ICD-10-CM

## 2019-02-22 DIAGNOSIS — I5042 Chronic combined systolic (congestive) and diastolic (congestive) heart failure: Secondary | ICD-10-CM

## 2019-02-22 DIAGNOSIS — I480 Paroxysmal atrial fibrillation: Secondary | ICD-10-CM | POA: Diagnosis not present

## 2019-02-22 DIAGNOSIS — I255 Ischemic cardiomyopathy: Secondary | ICD-10-CM

## 2019-02-22 DIAGNOSIS — I2510 Atherosclerotic heart disease of native coronary artery without angina pectoris: Secondary | ICD-10-CM

## 2019-02-22 DIAGNOSIS — N183 Chronic kidney disease, stage 3 unspecified: Secondary | ICD-10-CM | POA: Diagnosis not present

## 2019-02-22 MED ORDER — FUROSEMIDE 40 MG PO TABS: ORAL_TABLET | ORAL | 3 refills | Status: DC

## 2019-02-22 NOTE — Progress Notes (Signed)
Cardiology Office Note    Date:  02/22/2019   ID:  Noah [name], DOB 10/05/1940, MRN [id number]  PCP:  Estanislado PandySasser, Paul W, MD  Cardiologist: Prentice DockerSuresh Koneswaran, MD    Chief Complaint  Patient presents with   Hospitalization Follow-up    History of Present Illness:    Noah [name] is a 78 y.o. 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  He was last examined by Dr. Purvis SheffieldKoneswaran in 10/2018 and denied any recent ches pain or dyspnea on exertion.  He was still having intermittent lower extremity edema but symptoms had overall been stable. Weight was at 280 lbs and he was continued on ASA, Lasix 80mg  in AM/40mg  in PM, Imdur 30mg  daily, Toprol-XL 100mg  BID, Entresto 49-51mg  BID, Simvastatin 80mg  daily and Coumadin.   By review of records, [facility]16/2019 for evaluation of worsening dyspnea and weakness that had started the night prior to arrival. He denied any associated chest pain, fever or chills.  EKG showed V pacing. CXR showed no acute abnormalities. proBNP was elevated to 1591 and initial and delta troponin values were flat at 0.02. Creatinine was stable at 1.69.  He was given a dose of IV Lasix 40 mg and it is unclear by the records if he was discharged home or admitted and an H&P or Discharge Summary were not faxed over.  In talking with the patient today, he reports that he was actually admitted to the hospital for a 2-day stay.  He responded well with IV Lasix. He says that his dyspnea acutely developed the evening prior to admission. He was not weighing himself daily and is unsure of any significant change leading up to  admission. He had been taking Lasix 80 mg daily but only took 40 mg in the afternoon on occasion and has been doing this for several months. He denies any associated chest pain or palpitations during admission. Since then, he feels like his breathing has significantly improved. He still has some residual right lower extremity edema but says this has significantly improved as well.   Past Medical History:  Diagnosis Date   CAD S/P percutaneous coronary angioplasty    remote PCIs in 1990's- last CFX PCI 2008   Chronic kidney disease, stage III (moderate)    Diabetes mellitus with complication (HCC)    with hyperglycemia and diabetic autonomic poly neuropathy   Gastro-esophageal reflux disease with esophagitis    Hyperlipidemia    Hypertension    Ischemic cardiomyopathy 01/20/2018   St Jude ICD    Morbid obesity (HCC)    Obstructive sleep apnea    on CPAP   Polyneuropathy    Primary insomnia     Past Surgical History:  Procedure Laterality Date   BIV ICD INSERTION CRT-D N/A 01/20/2018   Procedure: BIV ICD INSERTION CRT-D;  Surgeon: Marinus Mawaylor, Gregg W, MD;  [facility];  Service: Cardiovascular;  Laterality: N/A;   BIV PACEMAKER INSERTION CRT-P  01/20/2018   [employer] Jude- Dr Ladona Ridgelaylor   CARDIAC CATHETERIZATION     multiple angioplasty X 8, 4 stents     Current Medications: Outpatient  Medications Prior to Visit  Medication Sig Dispense Refill   acetaminophen (TYLENOL) 650 MG CR tablet Take 1,300 mg by mouth at bedtime.     aspirin EC 81 MG tablet Take 1 tablet (81 mg total) by mouth daily. 90 tablet 3   collagenase (SANTYL) ointment Apply 1 application topically daily. Wound measurements left ankle - 2.5 x 1.8 x 0.3cm, and 3.5 x 3.0 x 0.3cm 30 g 7   cyclobenzaprine (FLEXERIL) 10 MG tablet Take 10 mg by mouth 3 (three) times daily as needed for spasms.  3   famotidine (PEPCID) 20 MG tablet Take 20 mg by mouth at bedtime.     finasteride (PROSCAR) 5 MG tablet  Take 5 mg by mouth every evening.   4   FLUoxetine (PROZAC) 20 MG capsule Take 20 mg by mouth every evening.     gabapentin (NEURONTIN) 100 MG capsule Take 100 mg by mouth 2 (two) times daily.      glucose blood (ONE TOUCH ULTRA TEST) test strip Check your sugars twice a day 100 each 12   Insulin Glargine-Lixisenatide (SOLIQUA) 100-33 UNT-MCG/ML SOPN Inject 60 Units into the skin every evening.      Insulin Pen Needle (NOVOFINE) 30G X 8 MM MISC Inject 10 each into the skin as needed. 90 each 3   levETIRAcetam (KEPPRA) 750 MG tablet Take 750 mg by mouth 2 (two) times daily.     metFORMIN (GLUCOPHAGE-XR) 500 MG 24 hr tablet Take 1,500 mg by mouth daily.     metoprolol succinate (TOPROL-XL) 100 MG 24 hr tablet Take 100 mg by mouth daily.     Omega-3 Fatty Acids (FISH OIL) 1000 MG CAPS Take 1,000 mg by mouth 2 (two) times daily.      omeprazole (PRILOSEC) 20 MG capsule Take 20 mg by mouth daily.     sacubitril-valsartan (ENTRESTO) 49-51 MG Take 1 tablet by mouth 2 (two) times daily. 60 tablet 6   silver sulfADIAZINE (SILVADENE) 1 % cream APPLY 1 CREAM TO SKIN ONCE A DAY     simvastatin (ZOCOR) 80 MG tablet TAKE 80 MG (1 TABLET) DAILY. 90 tablet 3   Tamsulosin HCl (FLOMAX) 0.4 MG CAPS Take 0.8 mg by mouth every evening.     warfarin (COUMADIN) 5 MG tablet Take 1/2 tablet daily except 1 tablet on Tuesdays, Thursdays and Saturdays (Patient taking differently: Take 1/2 tablet (2.5mg ) daily except 1 tablet (5mg ) on Tuesdays.) 45 tablet 3   benzonatate (TESSALON) 100 MG capsule TAKE 1 CAPSULE BY MOUTH THREE TIMES A DAY AS NEEDED FOR COUGH     furosemide (LASIX) 40 MG tablet Take 2 tabs (80mg ) by mouth every morning & 1 tab (40mg ) every afternoon 90 tablet 6   glipiZIDE (GLUCOTROL XL) 10 MG 24 hr tablet Take 10 mg by mouth 2 (two) times daily.     ipratropium-albuterol (DUONEB) 0.5-2.5 (3) MG/3ML SOLN INHALE 1 CONTENTS OF 1 VIAL 4 TIMES A DAY AS NEEDED FOR COUGH     metoprolol  (TOPROL-XL) 200 MG 24 hr tablet Take 100 mg by mouth daily.      Multiple Vitamin (MULTIVITAMIN) capsule Take 1 capsule by mouth daily.      ranitidine (ZANTAC) 150 MG tablet Take 150 mg by mouth at bedtime.     isosorbide mononitrate (IMDUR) 30 MG 24 hr tablet Take 30 mg by mouth daily.   3   No facility-administered medications prior to visit.      Allergies:   Codeine and Erythromycin-sulfisoxazole  Social History   Socioeconomic History   Marital status: Married    Spouse name: Not on file   Number of children: Not on file   Years of education: Not on file   Highest education level: Not on file  Occupational History   Occupation: Retired  Scientist, product/process development strain: Not on file   Food insecurity    Worry: Not on file    Inability: Not on Lexicographer needs    Medical: Not on file    Non-medical: Not on file  Tobacco Use   Smoking status: Former Smoker    Packs/day: 1.00    Years: 30.00    Pack years: 30.00    Quit date: 09/01/2000    Years since quitting: 18.4   Smokeless tobacco: Never Used  Substance and Sexual Activity   Alcohol use: Yes    Alcohol/week: 0.0 standard drinks   Drug use: No   Sexual activity: Not on file  Lifestyle   Physical activity    Days per week: Not on file    Minutes per session: Not on file   Stress: Not on file  Relationships   Social connections    Talks on phone: Not on file    Gets together: Not on file    Attends religious service: Not on file    Active member of club or organization: Not on file    Attends meetings of clubs or organizations: Not on file    Relationship status: Not on file  Other Topics Concern   Not on file  Social History Narrative   Not on file     Family History:  The patient's family history includes Breast cancer in his mother; CAD in his mother; Cancer in his brother; Heart attack in his father.   Review of Systems:   Please see the history of present  illness.     General:  No chills, fever, night sweats or weight changes.  Cardiovascular:  No chest pain, orthopnea, palpitations, paroxysmal nocturnal dyspnea. Positive for dyspnea on exertion and edema.  Dermatological: No rash, lesions/masses Respiratory: No cough, dyspnea Urologic: No hematuria, dysuria Abdominal:   No nausea, vomiting, diarrhea, bright red blood per rectum, melena, or hematemesis Neurologic:  No visual changes, wkns, changes in mental status. All other systems reviewed and are otherwise negative except as noted above.   Physical Exam:    VS:  BP 138/74    Pulse 84    Temp 98.3 F (36.8 C)    Ht 5\' 10"  (1.778 m)    Wt 287 lb (130.2 kg)    SpO2 94%    BMI 41.18 kg/m    General: Well developed, well nourished,male appearing in no acute distress. Head: Normocephalic, atraumatic, sclera non-icteric, no xanthomas, nares are without discharge.  Neck: No carotid bruits. JVD not elevated.  Lungs: Respirations regular and unlabored, without wheezes or rales.  Heart: Regular rate and rhythm. No S3 or S4.  No murmur, no rubs, or gallops appreciated. Abdomen: Soft, non-tender, non-distended with normoactive bowel sounds. No hepatomegaly. No rebound/guarding. No obvious abdominal masses. Msk:  Strength and tone appear normal for age. No joint deformities or effusions. Extremities: No clubbing or cyanosis. 1+ pitting edema, most notable along RLE.  Distal pedal pulses are 2+ bilaterally. Neuro: Alert and oriented X 3. Moves all extremities spontaneously. No focal deficits noted. Psych:  Responds to questions appropriately with a normal affect. Skin: No rashes or lesions noted  Wt Readings from Last 3 Encounters:  02/22/19 287 lb (130.2 kg)  11/14/18 280 lb (127 kg)  05/23/18 281 lb 8 oz (127.7 kg)     Studies/Labs Reviewed:   EKG:  EKG is not ordered today.    Recent Labs: 05/17/2018: ALT 18 05/23/2018: BUN 40; Creatinine, Ser 1.55; Hemoglobin 10.4; Platelets 170;  Potassium 4.3; Sodium 138   Lipid Panel    Component Value Date/Time   CHOL  09/09/2007 0300    121        ATP III CLASSIFICATION:  <200     mg/dL   Desirable  370-964  mg/dL   Borderline High  >=383    mg/dL   High   TRIG 92 81/84/0375 0300   HDL 28 (L) 09/09/2007 0300   CHOLHDL 4.3 09/09/2007 0300   VLDL 18 09/09/2007 0300   LDLCALC  09/09/2007 0300    75        Total Cholesterol/HDL:CHD Risk Coronary Heart Disease Risk Table                     Men   Women  1/2 Average Risk   3.4   3.3    Additional studies/ records that were reviewed today include:   Echocardiogram: 04/2018 IMPRESSIONS    1. The left ventricle has mild-moderately reduced systolic function of 40-45%. The cavity size is normal. There is moderate left ventricular wall thickness. Echo evidence of unable to assess diastolic filling patterns.  2. Hypokinesis if the mid-apical inferior, inferoseptal, anteroseptal and apical myocardium.  3. Normal left atrial size.  4. Normal right atrial size.  5. Normal tricuspid valve.  6. The inferior vena cava was dilated in size with >50% respiratory variablity.  7. No atrial level shunt detected by color flow Doppler.  Assessment:    1. Chronic combined systolic and diastolic congestive heart failure (HCC)   2. Ischemic cardiomyopathy   3. Coronary artery disease involving native coronary artery of native heart without angina pectoris   4. Paroxysmal atrial fibrillation (HCC)   5. Essential hypertension   6. Hyperlipidemia LDL goal <70   7. Stage 3 chronic kidney disease, unspecified whether stage 3a or 3b CKD   8. OSA (obstructive sleep apnea)      Plan:   In order of problems listed above:  1. Chronic Combined Systolic and Diastolic CHF/Ischemic Cardiomyopathy - he has a known reduced EF of 40-45% by most recent echo as outlined above. He is s/p BiV placement which is followed by Dr. Ladona Ridgel. Most recent interrogation showed 7.8% PAF burden and 11 beats  NSVT.  - dyspnea is now back to baseline following recent admission. He still has 1+ pitting edema on examination. He has only been taking Lasix 80mg  daily instead of as listed as 80mg  in AM/40mg  in PM. I recommended he take the 40mg  afternoon dose for the next 3-4 days then take 40mg  in the afternoon as needed for edema or weight gain. Reviewed the importance of following daily weights.   - continue Toprol-XL, Imdur, and Entresto at current dosing.   2. CAD - he is s/p prior PCI to LAD and RCA in 1990's with most recent being DES to LCx in 2008. He did have a low-risk NST in 09/2017. - troponin values were negative during recent admission with EKG showing no acute changes.  - continue ASA (has been on this along with Coumadin per his Primary Cardiologist), BB, Imdur and statin therapy.   3.  Paroxysmal Atrial Fibrillation - he denies any recent palpitations. Recent device interrogation showed a 7.8% AF burden. Continue Toprol-XL for rate control and Coumadin for anticoagulation.   4. HTN - BP is well-controlled at 138/74 during today's visit. Continue current medication regimen.   5. HLD - LDL not at goal by recent labs and Simvastatin was increased to 80mg  daily. The recommended max dose of this is 40mg  daily. He denies any myalgias. If LDL remains above goal, would recommend switching to Crestor.   6. Stage 3 CKD - creatinine stable at 1.69 during admission which is close to baseline. Was 1.65 in 10/2018.  7. OSA - continued compliance with CPAP encouraged.   Medication Adjustments/Labs and Tests Ordered: Current medicines are reviewed at length with the patient today.  Concerns regarding medicines are outlined above.  Medication changes, Labs and Tests ordered today are listed in the Patient Instructions below. Patient Instructions  Medication Instructions:  Your physician recommends that you continue on your current medications as directed. Please refer to the Current Medication list  given to you today.  Take Lasix 80 mg in the AM Daily and 40 mg in the PM Daily  for the next 4 Days then resume Lasix 80 mg Daily  in the AM and 40 mg As needed.   *If you need a refill on your cardiac medications before your next appointment, please call your pharmacy*  Lab Work: NONE  If you have labs (blood work) drawn today and your tests are completely normal, you will receive your results only by:  MyChart Message (if you have MyChart) OR  A paper copy in the mail If you have any lab test that is abnormal or we need to change your treatment, we will call you to review the results.  Testing/Procedures: NONE   Follow-Up: At [facility], you and your health needs are our priority.  As part of our continuing mission to provide you with exceptional heart care, we have created designated Provider Care Teams.  These Care Teams include your primary Cardiologist (physician) and Advanced Practice Providers (APPs -  Physician Assistants and Nurse Practitioners) who all work together to provide you with the care you need, when you need it.  Your next appointment:   3 months  The format for your next appointment:   In Person  Provider:   Prentice DockerSuresh Koneswaran, MD  Other Instructions Thank you for choosing [facility]!       Signed, Ellsworth LennoxBrittany M Reighlyn Elmes, PA-C  02/22/2019 7:26 PM    [facility] [address] [place], [place] [address] Phone: [phone] Fax: [phone]

## 2019-02-22 NOTE — Patient Instructions (Signed)
Medication Instructions:  Your physician recommends that you continue on your current medications as directed. Please refer to the Current Medication list given to you today.  Take Lasix 80 mg in the AM Daily and 40 mg in the PM Daily  for the next 4 Days then resume Lasix 80 mg Daily  in the AM and 40 mg As needed.   *If you need a refill on your cardiac medications before your next appointment, please call your pharmacy*  Lab Work: NONE  If you have labs (blood work) drawn today and your tests are completely normal, you will receive your results only by: Marland Kitchen MyChart Message (if you have MyChart) OR . A paper copy in the mail If you have any lab test that is abnormal or we need to change your treatment, we will call you to review the results.  Testing/Procedures: NONE   Follow-Up: At [facility], you and your health needs are our priority.  As part of our continuing mission to provide you with exceptional heart care, we have created designated Provider Care Teams.  These Care Teams include your primary Cardiologist (physician) and Advanced Practice Providers (APPs -  Physician Assistants and Nurse Practitioners) who all work together to provide you with the care you need, when you need it.  Your next appointment:   3 months  The format for your next appointment:   In Person  Provider:   Kate Sable, MD  Other Instructions Thank you for choosing [facility]!

## 2019-02-28 DIAGNOSIS — D509 Iron deficiency anemia, unspecified: Secondary | ICD-10-CM | POA: Diagnosis not present

## 2019-02-28 DIAGNOSIS — R0902 Hypoxemia: Secondary | ICD-10-CM | POA: Diagnosis not present

## 2019-02-28 DIAGNOSIS — E1165 Type 2 diabetes mellitus with hyperglycemia: Secondary | ICD-10-CM | POA: Diagnosis not present

## 2019-02-28 DIAGNOSIS — R5383 Other fatigue: Secondary | ICD-10-CM | POA: Diagnosis not present

## 2019-02-28 DIAGNOSIS — I10 Essential (primary) hypertension: Secondary | ICD-10-CM | POA: Diagnosis not present

## 2019-02-28 DIAGNOSIS — E782 Mixed hyperlipidemia: Secondary | ICD-10-CM | POA: Diagnosis not present

## 2019-02-28 DIAGNOSIS — I482 Chronic atrial fibrillation, unspecified: Secondary | ICD-10-CM | POA: Diagnosis not present

## 2019-03-05 ENCOUNTER — Other Ambulatory Visit: Payer: Self-pay

## 2019-03-05 ENCOUNTER — Ambulatory Visit (INDEPENDENT_AMBULATORY_CARE_PROVIDER_SITE_OTHER): Payer: Medicare HMO | Admitting: *Deleted

## 2019-03-05 DIAGNOSIS — I255 Ischemic cardiomyopathy: Secondary | ICD-10-CM | POA: Diagnosis not present

## 2019-03-05 DIAGNOSIS — I4891 Unspecified atrial fibrillation: Secondary | ICD-10-CM

## 2019-03-05 DIAGNOSIS — Z5181 Encounter for therapeutic drug level monitoring: Secondary | ICD-10-CM

## 2019-03-05 LAB — POCT INR: INR: 4.4 — AB (ref 2.0–3.0)

## 2019-03-05 NOTE — Patient Instructions (Signed)
Hold coumadin Tuesday and Wednesday then decrease dose to 1/2 tablet daily Recheck in 2 wks

## 2019-03-20 ENCOUNTER — Other Ambulatory Visit: Payer: Self-pay

## 2019-03-20 ENCOUNTER — Ambulatory Visit (INDEPENDENT_AMBULATORY_CARE_PROVIDER_SITE_OTHER): Payer: Medicare HMO | Admitting: *Deleted

## 2019-03-20 DIAGNOSIS — Z5181 Encounter for therapeutic drug level monitoring: Secondary | ICD-10-CM

## 2019-03-20 DIAGNOSIS — I255 Ischemic cardiomyopathy: Secondary | ICD-10-CM | POA: Diagnosis not present

## 2019-03-20 DIAGNOSIS — I4891 Unspecified atrial fibrillation: Secondary | ICD-10-CM | POA: Diagnosis not present

## 2019-03-20 LAB — POCT INR: INR: 1.9 — AB (ref 2.0–3.0)

## 2019-03-20 NOTE — Patient Instructions (Signed)
Took coumadin this morning Take extra 1/2 tablet of warfarin when you get home then resume 1/2 tablet daily Recheck in 3 wks

## 2019-03-21 DIAGNOSIS — I10 Essential (primary) hypertension: Secondary | ICD-10-CM | POA: Diagnosis not present

## 2019-03-21 DIAGNOSIS — E1165 Type 2 diabetes mellitus with hyperglycemia: Secondary | ICD-10-CM | POA: Diagnosis not present

## 2019-03-21 DIAGNOSIS — E1122 Type 2 diabetes mellitus with diabetic chronic kidney disease: Secondary | ICD-10-CM | POA: Diagnosis not present

## 2019-03-21 DIAGNOSIS — E1121 Type 2 diabetes mellitus with diabetic nephropathy: Secondary | ICD-10-CM | POA: Diagnosis not present

## 2019-03-21 DIAGNOSIS — I259 Chronic ischemic heart disease, unspecified: Secondary | ICD-10-CM | POA: Diagnosis not present

## 2019-03-21 DIAGNOSIS — E1140 Type 2 diabetes mellitus with diabetic neuropathy, unspecified: Secondary | ICD-10-CM | POA: Diagnosis not present

## 2019-03-21 DIAGNOSIS — I5023 Acute on chronic systolic (congestive) heart failure: Secondary | ICD-10-CM | POA: Diagnosis not present

## 2019-03-21 DIAGNOSIS — E1143 Type 2 diabetes mellitus with diabetic autonomic (poly)neuropathy: Secondary | ICD-10-CM | POA: Diagnosis not present

## 2019-03-28 ENCOUNTER — Inpatient Hospital Stay (HOSPITAL_COMMUNITY)
Admission: EM | Admit: 2019-03-28 | Discharge: 2019-03-31 | DRG: 291 | Disposition: A | Discharge status: Home / Self Care | Payer: Medicare HMO | Attending: Family Medicine | Admitting: Family Medicine

## 2019-03-28 ENCOUNTER — Encounter (HOSPITAL_COMMUNITY): Payer: Self-pay | Admitting: *Deleted

## 2019-03-28 ENCOUNTER — Other Ambulatory Visit: Payer: Self-pay

## 2019-03-28 ENCOUNTER — Emergency Department (HOSPITAL_COMMUNITY): Payer: Medicare HMO

## 2019-03-28 DIAGNOSIS — I480 Paroxysmal atrial fibrillation: Secondary | ICD-10-CM | POA: Diagnosis present

## 2019-03-28 DIAGNOSIS — G4733 Obstructive sleep apnea (adult) (pediatric): Secondary | ICD-10-CM | POA: Diagnosis not present

## 2019-03-28 DIAGNOSIS — E11649 Type 2 diabetes mellitus with hypoglycemia without coma: Secondary | ICD-10-CM | POA: Diagnosis present

## 2019-03-28 DIAGNOSIS — E1122 Type 2 diabetes mellitus with diabetic chronic kidney disease: Secondary | ICD-10-CM | POA: Diagnosis present

## 2019-03-28 DIAGNOSIS — I5082 Biventricular heart failure: Secondary | ICD-10-CM | POA: Diagnosis present

## 2019-03-28 DIAGNOSIS — Z803 Family history of malignant neoplasm of breast: Secondary | ICD-10-CM | POA: Diagnosis not present

## 2019-03-28 DIAGNOSIS — Z7982 Long term (current) use of aspirin: Secondary | ICD-10-CM | POA: Diagnosis not present

## 2019-03-28 DIAGNOSIS — N1831 Chronic kidney disease, stage 3a: Secondary | ICD-10-CM | POA: Diagnosis present

## 2019-03-28 DIAGNOSIS — E118 Type 2 diabetes mellitus with unspecified complications: Secondary | ICD-10-CM

## 2019-03-28 DIAGNOSIS — I509 Heart failure, unspecified: Secondary | ICD-10-CM

## 2019-03-28 DIAGNOSIS — Z950 Presence of cardiac pacemaker: Secondary | ICD-10-CM | POA: Diagnosis not present

## 2019-03-28 DIAGNOSIS — Z9861 Coronary angioplasty status: Secondary | ICD-10-CM | POA: Diagnosis not present

## 2019-03-28 DIAGNOSIS — Z885 Allergy status to narcotic agent status: Secondary | ICD-10-CM

## 2019-03-28 DIAGNOSIS — Z87891 Personal history of nicotine dependence: Secondary | ICD-10-CM | POA: Diagnosis not present

## 2019-03-28 DIAGNOSIS — Z6841 Body Mass Index (BMI) 40.0 and over, adult: Secondary | ICD-10-CM

## 2019-03-28 DIAGNOSIS — K2100 Gastro-esophageal reflux disease with esophagitis, without bleeding: Secondary | ICD-10-CM | POA: Diagnosis present

## 2019-03-28 DIAGNOSIS — R569 Unspecified convulsions: Secondary | ICD-10-CM | POA: Diagnosis present

## 2019-03-28 DIAGNOSIS — I2510 Atherosclerotic heart disease of native coronary artery without angina pectoris: Secondary | ICD-10-CM | POA: Diagnosis present

## 2019-03-28 DIAGNOSIS — E1121 Type 2 diabetes mellitus with diabetic nephropathy: Secondary | ICD-10-CM | POA: Diagnosis not present

## 2019-03-28 DIAGNOSIS — Z794 Long term (current) use of insulin: Secondary | ICD-10-CM | POA: Diagnosis not present

## 2019-03-28 DIAGNOSIS — Z881 Allergy status to other antibiotic agents status: Secondary | ICD-10-CM | POA: Diagnosis not present

## 2019-03-28 DIAGNOSIS — R0602 Shortness of breath: Secondary | ICD-10-CM

## 2019-03-28 DIAGNOSIS — Z8249 Family history of ischemic heart disease and other diseases of the circulatory system: Secondary | ICD-10-CM

## 2019-03-28 DIAGNOSIS — E785 Hyperlipidemia, unspecified: Secondary | ICD-10-CM | POA: Diagnosis not present

## 2019-03-28 DIAGNOSIS — F5101 Primary insomnia: Secondary | ICD-10-CM | POA: Diagnosis present

## 2019-03-28 DIAGNOSIS — Z20828 Contact with and (suspected) exposure to other viral communicable diseases: Secondary | ICD-10-CM | POA: Diagnosis present

## 2019-03-28 DIAGNOSIS — I5043 Acute on chronic combined systolic (congestive) and diastolic (congestive) heart failure: Secondary | ICD-10-CM

## 2019-03-28 DIAGNOSIS — E1142 Type 2 diabetes mellitus with diabetic polyneuropathy: Secondary | ICD-10-CM | POA: Diagnosis not present

## 2019-03-28 DIAGNOSIS — N183 Chronic kidney disease, stage 3 unspecified: Secondary | ICD-10-CM | POA: Diagnosis present

## 2019-03-28 DIAGNOSIS — I4891 Unspecified atrial fibrillation: Secondary | ICD-10-CM | POA: Diagnosis present

## 2019-03-28 DIAGNOSIS — E782 Mixed hyperlipidemia: Secondary | ICD-10-CM | POA: Diagnosis present

## 2019-03-28 DIAGNOSIS — Z9989 Dependence on other enabling machines and devices: Secondary | ICD-10-CM | POA: Diagnosis present

## 2019-03-28 DIAGNOSIS — I255 Ischemic cardiomyopathy: Secondary | ICD-10-CM | POA: Diagnosis present

## 2019-03-28 DIAGNOSIS — I130 Hypertensive heart and chronic kidney disease with heart failure and stage 1 through stage 4 chronic kidney disease, or unspecified chronic kidney disease: Principal | ICD-10-CM | POA: Diagnosis present

## 2019-03-28 DIAGNOSIS — R0902 Hypoxemia: Secondary | ICD-10-CM | POA: Diagnosis not present

## 2019-03-28 DIAGNOSIS — N1832 Chronic kidney disease, stage 3b: Secondary | ICD-10-CM

## 2019-03-28 DIAGNOSIS — I5022 Chronic systolic (congestive) heart failure: Secondary | ICD-10-CM | POA: Diagnosis not present

## 2019-03-28 DIAGNOSIS — I110 Hypertensive heart disease with heart failure: Secondary | ICD-10-CM | POA: Diagnosis not present

## 2019-03-28 DIAGNOSIS — Z7901 Long term (current) use of anticoagulants: Secondary | ICD-10-CM | POA: Diagnosis not present

## 2019-03-28 DIAGNOSIS — I4821 Permanent atrial fibrillation: Secondary | ICD-10-CM | POA: Diagnosis present

## 2019-03-28 LAB — COMPREHENSIVE METABOLIC PANEL
ALT: 10 U/L (ref 0–44)
AST: 14 U/L — ABNORMAL LOW (ref 15–41)
Albumin: 3.3 g/dL — ABNORMAL LOW (ref 3.5–5.0)
Alkaline Phosphatase: 77 U/L (ref 38–126)
Anion gap: 13 (ref 5–15)
BUN: 46 mg/dL — ABNORMAL HIGH (ref 8–23)
CO2: 27 mmol/L (ref 22–32)
Calcium: 9.1 mg/dL (ref 8.9–10.3)
Chloride: 104 mmol/L (ref 98–111)
Creatinine, Ser: 1.64 mg/dL — ABNORMAL HIGH (ref 0.61–1.24)
GFR calc Af Amer: 46 mL/min — ABNORMAL LOW (ref >60)
GFR calc non Af Amer: 39 mL/min — ABNORMAL LOW (ref >60)
Glucose, Bld: 127 mg/dL — ABNORMAL HIGH (ref 70–99)
Potassium: 4.4 mmol/L (ref 3.5–5.1)
Sodium: 144 mmol/L (ref 135–145)
Total Bilirubin: 0.3 mg/dL (ref 0.3–1.2)
Total Protein: 7.1 g/dL (ref 6.5–8.1)

## 2019-03-28 LAB — URINALYSIS, ROUTINE W REFLEX MICROSCOPIC
Bacteria, UA: NONE SEEN
Bilirubin Urine: NEGATIVE
Glucose, UA: NEGATIVE mg/dL
Ketones, ur: NEGATIVE mg/dL
Leukocytes,Ua: NEGATIVE
Nitrite: NEGATIVE
Protein, ur: 30 mg/dL — AB
Specific Gravity, Urine: 1.006 (ref 1.005–1.030)
pH: 5 (ref 5.0–8.0)

## 2019-03-28 LAB — CBC WITH DIFFERENTIAL/PLATELET
Abs Immature Granulocytes: 0.05 10*3/uL (ref 0.00–0.07)
Basophils Absolute: 0.1 10*3/uL (ref 0.0–0.1)
Basophils Relative: 1 %
Eosinophils Absolute: 0.3 10*3/uL (ref 0.0–0.5)
Eosinophils Relative: 2 %
HCT: 40.4 % (ref 39.0–52.0)
Hemoglobin: 12 g/dL — ABNORMAL LOW (ref 13.0–17.0)
Immature Granulocytes: 0 %
Lymphocytes Relative: 13 %
Lymphs Abs: 1.7 10*3/uL (ref 0.7–4.0)
MCH: 27.8 pg (ref 26.0–34.0)
MCHC: 29.7 g/dL — ABNORMAL LOW (ref 30.0–36.0)
MCV: 93.7 fL (ref 80.0–100.0)
Monocytes Absolute: 0.9 10*3/uL (ref 0.1–1.0)
Monocytes Relative: 7 %
Neutro Abs: 10.2 10*3/uL — ABNORMAL HIGH (ref 1.7–7.7)
Neutrophils Relative %: 77 %
Platelets: 197 10*3/uL (ref 150–400)
RBC: 4.31 MIL/uL (ref 4.22–5.81)
RDW: 13.6 % (ref 11.5–15.5)
WBC: 13.1 10*3/uL — ABNORMAL HIGH (ref 4.0–10.5)
nRBC: 0 % (ref 0.0–0.2)

## 2019-03-28 LAB — PROTIME-INR
INR: 2.6 — ABNORMAL HIGH (ref 0.8–1.2)
Prothrombin Time: 27.8 seconds — ABNORMAL HIGH (ref 11.4–15.2)

## 2019-03-28 LAB — TROPONIN I (HIGH SENSITIVITY)
Troponin I (High Sensitivity): 15 ng/L (ref <18)
Troponin I (High Sensitivity): 17 ng/L (ref <18)

## 2019-03-28 LAB — BRAIN NATRIURETIC PEPTIDE: B Natriuretic Peptide: 345 pg/mL — ABNORMAL HIGH (ref 0.0–100.0)

## 2019-03-28 MED ORDER — SODIUM CHLORIDE 0.9 % IV SOLN: 250.0000 mL | INTRAVENOUS | Status: DC | PRN

## 2019-03-28 MED ORDER — FUROSEMIDE 10 MG/ML IJ SOLN
80.0000 mg | Freq: Once | INTRAMUSCULAR | Status: AC
  Administered 2019-03-28: 20:00:00 80 mg via INTRAVENOUS
  Filled 2019-03-28: qty 8

## 2019-03-28 MED ORDER — ASPIRIN EC 81 MG PO TBEC
81.0000 mg | DELAYED_RELEASE_TABLET | Freq: Every day | ORAL | Status: DC
  Administered 2019-03-29 – 2019-03-31 (×3): 81 mg via ORAL
  Filled 2019-03-28 (×3): qty 1

## 2019-03-28 MED ORDER — FLUOXETINE HCL 20 MG PO CAPS
20.0000 mg | ORAL_CAPSULE | Freq: Every evening | ORAL | Status: DC
  Administered 2019-03-29 – 2019-03-31 (×3): 20 mg via ORAL
  Filled 2019-03-28 (×3): qty 1

## 2019-03-28 MED ORDER — GABAPENTIN 100 MG PO CAPS
100.0000 mg | ORAL_CAPSULE | Freq: Two times a day (BID) | ORAL | Status: DC
  Administered 2019-03-29 – 2019-03-31 (×6): 100 mg via ORAL
  Filled 2019-03-28 (×6): qty 1

## 2019-03-28 MED ORDER — ACETAMINOPHEN 325 MG PO TABS
975.0000 mg | ORAL_TABLET | Freq: Every day | ORAL | Status: DC
  Administered 2019-03-29 – 2019-03-30 (×3): 975 mg via ORAL
  Filled 2019-03-28 (×3): qty 3

## 2019-03-28 MED ORDER — FUROSEMIDE 10 MG/ML IJ SOLN
40.0000 mg | Freq: Two times a day (BID) | INTRAMUSCULAR | Status: DC
  Administered 2019-03-29 – 2019-03-31 (×5): 40 mg via INTRAVENOUS
  Filled 2019-03-28 (×5): qty 4

## 2019-03-28 MED ORDER — ATORVASTATIN CALCIUM 40 MG PO TABS
40.0000 mg | ORAL_TABLET | Freq: Every day | ORAL | Status: DC
  Administered 2019-03-29 – 2019-03-31 (×4): 40 mg via ORAL
  Filled 2019-03-28 (×4): qty 1

## 2019-03-28 MED ORDER — WARFARIN SODIUM 5 MG PO TABS
5.0000 mg | ORAL_TABLET | ORAL | Status: DC
  Administered 2019-03-29: 5 mg via ORAL
  Filled 2019-03-28: qty 1

## 2019-03-28 MED ORDER — CYCLOBENZAPRINE HCL 10 MG PO TABS: 10.0000 mg | ORAL_TABLET | Freq: Three times a day (TID) | ORAL | Status: DC | PRN

## 2019-03-28 MED ORDER — ONDANSETRON HCL 4 MG/2ML IJ SOLN: 4.0000 mg | Freq: Four times a day (QID) | INTRAMUSCULAR | Status: DC | PRN

## 2019-03-28 MED ORDER — SACUBITRIL-VALSARTAN 49-51 MG PO TABS
1.0000 | ORAL_TABLET | Freq: Two times a day (BID) | ORAL | Status: DC
  Administered 2019-03-29 – 2019-03-31 (×6): 1 via ORAL
  Filled 2019-03-28 (×6): qty 1

## 2019-03-28 MED ORDER — FINASTERIDE 5 MG PO TABS
5.0000 mg | ORAL_TABLET | Freq: Every evening | ORAL | Status: DC
  Administered 2019-03-29 – 2019-03-31 (×4): 5 mg via ORAL
  Filled 2019-03-28 (×4): qty 1

## 2019-03-28 MED ORDER — WARFARIN SODIUM 2.5 MG PO TABS
2.5000 mg | ORAL_TABLET | ORAL | Status: AC
  Administered 2019-03-29: 2.5 mg via ORAL
  Filled 2019-03-28 (×2): qty 1

## 2019-03-28 MED ORDER — ONDANSETRON HCL 4 MG PO TABS: 4.0000 mg | ORAL_TABLET | Freq: Four times a day (QID) | ORAL | Status: DC | PRN

## 2019-03-28 MED ORDER — INSULIN GLARGINE 100 UNIT/ML ~~LOC~~ SOLN
40.0000 [IU] | Freq: Every day | SUBCUTANEOUS | Status: DC
  Filled 2019-03-28: qty 0.4

## 2019-03-28 MED ORDER — SODIUM CHLORIDE 0.9% FLUSH
3.0000 mL | Freq: Two times a day (BID) | INTRAVENOUS | Status: DC
  Administered 2019-03-29 – 2019-03-31 (×6): 3 mL via INTRAVENOUS

## 2019-03-28 MED ORDER — FAMOTIDINE 20 MG PO TABS
20.0000 mg | ORAL_TABLET | Freq: Every day | ORAL | Status: DC
  Administered 2019-03-29 – 2019-03-30 (×3): 20 mg via ORAL
  Filled 2019-03-28 (×3): qty 1

## 2019-03-28 MED ORDER — WARFARIN - PHARMACIST DOSING INPATIENT
Freq: Every day | Status: DC
  Administered 2019-03-29: 18:00:00

## 2019-03-28 MED ORDER — SODIUM CHLORIDE 0.9% FLUSH: 3.0000 mL | INTRAVENOUS | Status: DC | PRN

## 2019-03-28 MED ORDER — ISOSORBIDE MONONITRATE ER 60 MG PO TB24
30.0000 mg | ORAL_TABLET | Freq: Every day | ORAL | Status: DC
  Administered 2019-03-29 – 2019-03-31 (×3): 30 mg via ORAL
  Filled 2019-03-28 (×3): qty 1

## 2019-03-28 MED ORDER — INSULIN ASPART 100 UNIT/ML ~~LOC~~ SOLN
0.0000 [IU] | Freq: Three times a day (TID) | SUBCUTANEOUS | Status: DC
  Administered 2019-03-29 – 2019-03-30 (×3): 2 [IU] via SUBCUTANEOUS
  Administered 2019-03-31: 3 [IU] via SUBCUTANEOUS
  Administered 2019-03-31: 2 [IU] via SUBCUTANEOUS

## 2019-03-28 MED ORDER — METOPROLOL SUCCINATE ER 50 MG PO TB24
100.0000 mg | ORAL_TABLET | Freq: Every day | ORAL | Status: DC
  Administered 2019-03-29 – 2019-03-31 (×3): 100 mg via ORAL
  Filled 2019-03-28 (×3): qty 2

## 2019-03-28 MED ORDER — WARFARIN SODIUM 2.5 MG PO TABS
2.5000 mg | ORAL_TABLET | ORAL | Status: DC
  Administered 2019-03-30: 2.5 mg via ORAL
  Filled 2019-03-28: qty 1

## 2019-03-28 MED ORDER — LEVETIRACETAM 500 MG PO TABS
750.0000 mg | ORAL_TABLET | Freq: Two times a day (BID) | ORAL | Status: DC
  Administered 2019-03-29 – 2019-03-31 (×6): 750 mg via ORAL
  Filled 2019-03-28 (×6): qty 1

## 2019-03-28 NOTE — ED Provider Notes (Addendum)
[facility] EMERGENCY DEPARTMENT Provider Note   CSN: [id number] Arrival date & time: 03/28/19  1448     History   Chief Complaint Chief Complaint  Patient presents with  . Shortness of Breath    HPI Noah [name] is a 78 y.o. male.     Patient not usually seen here but is followed by cardiology from our group in [place].  Patient known to have a combined systolic diastolic congestive heart failure.  Has cardiomyopathy.  Patient does have a AICD.  Was placed in June 2019.  Has a history of atrial fib stage III kidney disease hypertension hyperlipidemia.  Patient states that he has been having some increased leg swelling and shortness of breath over the past week states that he has crackles he been feeling tired.  Did mention some visual hallucinations started over a week ago.  But none now.  Patient's primary care doctor recommended he come in for evaluation.  Patient denies any chest pain.  Patient is on Coumadin.     Past Medical History:  Diagnosis Date  . CAD S/P percutaneous coronary angioplasty    remote PCIs in 1990's- last CFX PCI 2008  . Chronic kidney disease, stage III (moderate)   . Diabetes mellitus with complication (HCC)    with hyperglycemia and diabetic autonomic poly neuropathy  . Gastro-esophageal reflux disease with esophagitis   . Hyperlipidemia   . Hypertension   . Ischemic cardiomyopathy 01/20/2018   St Jude ICD   . Morbid obesity (HCC)   . Obstructive sleep apnea    on CPAP  . Polyneuropathy   . Primary insomnia     Patient Active Problem List   Diagnosis Date Noted  . Acute exacerbation of CHF (congestive heart failure) (HCC) 03/28/2019  . Fall at home, initial encounter 05/18/2018  . Syncope 05/18/2018  . CKD (chronic kidney disease) stage 3, GFR 30-59 ml/min 05/18/2018  . OSA (obstructive sleep apnea) 05/18/2018  . Subarachnoid hemorrhage (HCC) 05/17/2018  . Atrial fibrillation (HCC) 02/21/2018  . Encounter for therapeutic drug monitoring  02/21/2018  . Ischemic cardiomyopathy 01/21/2018  . 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 CAD S/P percutaneous coronary angioplasty 01/21/2018  . Chronic systolic (congestive) heart failure (HCC) 01/20/2018  . 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 Dyslipidemia, goal LDL below 70 01/16/2009  . OVERWEIGHT/OBESITY 01/16/2009  . DYSPNEA 01/16/2009    Past Surgical History:  Procedure Laterality Date  . BIV ICD INSERTION CRT-D N/A 01/20/2018   Procedure: BIV ICD INSERTION CRT-D;  Surgeon: Marinus Maw, MD;  Location: [facility] INVASIVE CV LAB;  Service: Cardiovascular;  Laterality: N/A;  . BIV PACEMAKER INSERTION CRT-P  01/20/2018   St Jude- Dr Ladona Ridgel  . CARDIAC CATHETERIZATION     multiple angioplasty X 8, 4 stents         Home Medications    Prior to Admission medications   Medication Sig Start Date End Date Taking? Authorizing Provider  acetaminophen (TYLENOL) 650 MG CR tablet Take 1,300 mg by mouth at bedtime.    [provider]  aspirin EC 81 MG tablet Take 1 tablet (81 mg total) by mouth daily. 10/10/17   Laqueta Linden, MD  collagenase (SANTYL) ointment Apply 1 application topically daily. Wound measurements left ankle - 2.5 x 1.8 x 0.3cm, and 3.5 x 3.0 x 0.3cm 09/22/18   Vivi Barrack, DPM  cyclobenzaprine (FLEXERIL) 10 MG tablet Take 10 mg by mouth 3 (three) times daily  as needed for spasms. 09/16/17   [provider]  famotidine (PEPCID) 20 MG tablet Take 20 mg by mouth at bedtime. 08/15/18   [provider]  finasteride (PROSCAR) 5 MG tablet Take 5 mg by mouth every evening.  08/13/17   [provider]  FLUoxetine (PROZAC) 20 MG capsule Take 20 mg by mouth every evening.    [provider]  furosemide (LASIX) 40 MG tablet Take 2 tabs (80mg ) by mouth every morning & 1 tab (40mg ) every afternoon 02/22/19   Iran OuchStrader, Lennart PallBrittany M, PA-C  gabapentin (NEURONTIN) 100 MG capsule Take  100 mg by mouth 2 (two) times daily.  10/05/17   [provider]  glucose blood (ONE TOUCH ULTRA TEST) test strip Check your sugars twice a day 06/06/12   Carlus PavlovGherghe, Cristina, MD  Insulin Glargine-Lixisenatide (SOLIQUA) 100-33 UNT-MCG/ML SOPN Inject 60 Units into the skin every evening.     [provider]  Insulin Pen Needle (NOVOFINE) 30G X 8 MM MISC Inject 10 each into the skin as needed. 06/20/12   Carlus PavlovGherghe, Cristina, MD  isosorbide mononitrate (IMDUR) 30 MG 24 hr tablet Take 30 mg by mouth daily.  12/22/17   [provider]  levETIRAcetam (KEPPRA) 750 MG tablet Take 750 mg by mouth 2 (two) times daily. 07/10/18   [provider]  metFORMIN (GLUCOPHAGE-XR) 500 MG 24 hr tablet Take 1,500 mg by mouth daily.    [provider]  metoprolol succinate (TOPROL-XL) 100 MG 24 hr tablet Take 100 mg by mouth daily. 01/02/19   [provider]  Omega-3 Fatty Acids (FISH OIL) 1000 MG CAPS Take 1,000 mg by mouth 2 (two) times daily.     [provider]  omeprazole (PRILOSEC) 20 MG capsule Take 20 mg by mouth daily.    [provider]  sacubitril-valsartan (ENTRESTO) 49-51 MG Take 1 tablet by mouth 2 (two) times daily. 08/15/18   Laqueta LindenKoneswaran, Suresh A, MD  silver sulfADIAZINE (SILVADENE) 1 % cream APPLY 1 CREAM TO SKIN ONCE A DAY 08/30/18   [provider]  simvastatin (ZOCOR) 80 MG tablet TAKE 80 MG (1 TABLET) DAILY. 11/15/18   Laqueta LindenKoneswaran, Suresh A, MD  Tamsulosin HCl (FLOMAX) 0.4 MG CAPS Take 0.8 mg by mouth every evening.    [provider]  warfarin (COUMADIN) 5 MG tablet Take 1/2 tablet daily except 1 tablet on Tuesdays, Thursdays and Saturdays Patient taking differently: Take 1/2 tablet (2.5mg ) daily except 1 tablet (5mg ) on Tuesdays. 03/20/18   Laqueta LindenKoneswaran, Suresh A, MD    Family History Family History  Problem Relation Age of Onset  . Heart attack Father   . Breast cancer Mother   . CAD Mother   . Cancer Brother      Social History Social History   Tobacco Use  . Smoking status: Former Smoker    Packs/day: 1.00    Years: 30.00    Pack years: 30.00    Quit date: 09/01/2000    Years since quitting: 18.5  . Smokeless tobacco: Never Used  Substance Use Topics  . Alcohol use: Yes    Alcohol/week: 0.0 standard drinks  . Drug use: No     Allergies   Codeine and Erythromycin-sulfisoxazole   Review of Systems Review of Systems  Constitutional: Negative for chills and fever.  HENT: Negative for congestion, rhinorrhea and sore throat.   Eyes: Negative for visual disturbance.  Respiratory: Positive for shortness of breath. Negative for cough.   Cardiovascular: Positive for leg swelling. Negative  for chest pain.  Gastrointestinal: Negative for abdominal pain, diarrhea, nausea and vomiting.  Genitourinary: Negative for dysuria.  Musculoskeletal: Negative for back pain and neck pain.  Skin: Negative for rash.  Neurological: Negative for dizziness, light-headedness and headaches.  Hematological: Does not bruise/bleed easily.  Psychiatric/Behavioral: Negative for confusion.     Physical Exam Updated Vital Signs BP (!) 156/119   Pulse 77   Temp 97.8 F (36.6 C) (Oral)   Resp 15   Ht 1.727 m (5\' 8" )   Wt 131.5 kg   SpO2 96%   BMI 44.09 kg/m   Physical Exam Vitals signs and nursing note reviewed.  Constitutional:      General: He is not in acute distress.    Appearance: He is well-developed.  HENT:     Head: Normocephalic and atraumatic.  Eyes:     Extraocular Movements: Extraocular movements intact.     Conjunctiva/sclera: Conjunctivae normal.     Pupils: Pupils are equal, round, and reactive to light.  Neck:     Musculoskeletal: Normal range of motion and neck supple.  Cardiovascular:     Rate and Rhythm: Normal rate and regular rhythm.     Heart sounds: No murmur.  Pulmonary:     Effort: Pulmonary effort is normal. No respiratory distress.     Breath sounds: Rales present.   Abdominal:     Palpations: Abdomen is soft.     Tenderness: There is no abdominal tenderness.  Musculoskeletal:     Right lower leg: Edema present.     Left lower leg: Edema present.  Skin:    General: Skin is warm and dry.  Neurological:     General: No focal deficit present.     Mental Status: He is alert and oriented to person, place, and time.     Cranial Nerves: No cranial nerve deficit.     Sensory: No sensory deficit.     Motor: No weakness.      ED Treatments / Results  Labs (all labs ordered are listed, but only abnormal results are displayed) Labs Reviewed  PROTIME-INR - Abnormal; Notable for the following components:      Result Value   Prothrombin Time 27.8 (*)    INR 2.6 (*)    All other components within normal limits  COMPREHENSIVE METABOLIC PANEL - Abnormal; Notable for the following components:   Glucose, Bld 127 (*)    BUN 46 (*)    Creatinine, Ser 1.64 (*)    Albumin 3.3 (*)    AST 14 (*)    GFR calc non Af Amer 39 (*)    GFR calc Af Amer 46 (*)    All other components within normal limits  CBC WITH DIFFERENTIAL/PLATELET - Abnormal; Notable for the following components:   WBC 13.1 (*)    Hemoglobin 12.0 (*)    MCHC 29.7 (*)    Neutro Abs 10.2 (*)    All other components within normal limits  BRAIN NATRIURETIC PEPTIDE - Abnormal; Notable for the following components:   B Natriuretic Peptide 345.0 (*)    All other components within normal limits  SARS CORONAVIRUS 2 (TAT 6-24 HRS)  URINALYSIS, ROUTINE W REFLEX MICROSCOPIC  TROPONIN I (HIGH SENSITIVITY)  TROPONIN I (HIGH SENSITIVITY)    EKG EKG Interpretation  Date/Time:  Wednesday March 28 2019 18:13:42 EST Ventricular Rate:  80 PR Interval:    QRS Duration: 138 QT Interval:  446 QTC Calculation: 515 R Axis:   -89 Text Interpretation:  Atrial-sensed ventricular-paced rhythm Nonspecific IVCD with LAD Anterolateral infarct, age indeterminate Similar to 01/21/18 Confirmed by Vanetta Mulders  [phone]) on 03/28/2019 6:20:24 PM   Radiology Dg Chest Port 1 View  Result Date: 03/28/2019 CLINICAL DATA:  Shortness of breath, history coronary artery disease post angioplasty, ischemic cardiomyopathy, diabetes mellitus, hypertension EXAM: PORTABLE CHEST 1 VIEW COMPARISON:  Portable exam 1655 hours compared to 02/16/2019 FINDINGS: LEFT subclavian ICD with leads projecting over RIGHT atrium, RIGHT ventricle and coronary sinus. Enlargement of cardiac silhouette with pulmonary vascular congestion. Azygos fissure noted. Mediastinal contour stable. Minimal bibasilar atelectasis. Lungs otherwise clear. No pleural effusion or pneumothorax. IMPRESSION: Enlargement of cardiac silhouette with vascular congestion post AICD. Minimal bibasilar atelectasis. Electronically Signed   By: Ulyses Southward M.D.   On: 03/28/2019 17:02    Procedures Procedures (including critical care time)  Medications Ordered in ED Medications  furosemide (LASIX) injection 80 mg (80 mg Intravenous Given 03/28/19 1947)     Initial Impression / Assessment and Plan / ED Course  I have reviewed the triage vital signs and the nursing notes.  Pertinent labs & imaging results that were available during my care of the patient were reviewed by me and considered in my medical decision making (see chart for details).    Patient with known history of combined congestive heart failure has an AICD as well.  Patient sounds like he struggles with shortness of breath on and off due to his congestive heart failure.  But he feels as if things have been worse for about a week.  Very tired feeling legs got crackles in his lungs denies any chest pain increased leg swelling.  Patient is on CPAP at home but not on any oxygen.  His oxygen sats on room air here go down to 87%.  Chest x-ray suggestive of pulmonary congestion.  BNP not significantly elevated but it is up some.  Patient given an additional dose of 80 mg of Lasix IV breathing is a little bit  better.  Sats have improved.  Discussed with hospitalist who will admit.  Patient without any chest pain troponin up slightly at 15.  EKG showed atrial sensed ventricular paced rhythm.     Hospitalist will admit for presumed exacerbation of congestive heart failure.  And hypoxia.  Covid testing is pending.  Patient without any specific COVID-19 symptoms.  Final Clinical Impressions(s) / ED Diagnoses   Final diagnoses:  Acute on chronic combined systolic and diastolic congestive heart failure [facility])  Hypoxia    ED Discharge Orders    None       Vanetta Mulders, MD 03/28/19 2136    Vanetta Mulders, MD 03/28/19 2136

## 2019-03-28 NOTE — Progress Notes (Signed)
ANTICOAGULATION CONSULT NOTE - Initial Consult  Pharmacy Consult for Coumadin Indication: atrial fibrillation  Allergies  Allergen Reactions  . Codeine Other (See Comments)    constipation  . Erythromycin-Sulfisoxazole Other (See Comments)    Causes infection in throat and eyes    Patient Measurements: Height: 5\' 8"  (172.7 cm) Weight: 290 lb (131.5 kg) IBW/kg (Calculated) : 68.4  Vital Signs: Temp: 97.8 F (36.6 C) (12/09 1553) Temp Source: Oral (12/09 1553) BP: 156/101 (12/09 2200) Pulse Rate: 79 (12/09 2200)  Labs: Recent Labs    03/28/19 1625 03/28/19 2019  HGB 12.0*  --   HCT 40.4  --   PLT 197  --   LABPROT 27.8*  --   INR 2.6*  --   CREATININE 1.64*  --   TROPONINIHS 15 17    Estimated Creatinine Clearance: 49.1 mL/min (A) (by C-G formula based on SCr of 1.64 mg/dL (H)).   Medical History: Past Medical History:  Diagnosis Date  . CAD S/P percutaneous coronary angioplasty    remote PCIs in 1990's- last CFX PCI 2008  . Chronic kidney disease, stage III (moderate)   . Diabetes mellitus with complication (HCC)    with hyperglycemia and diabetic autonomic poly neuropathy  . Gastro-esophageal reflux disease with esophagitis   . Hyperlipidemia   . Hypertension   . Ischemic cardiomyopathy 01/20/2018   St Jude ICD   . Morbid obesity ([facility])   . Obstructive sleep apnea    on CPAP  . Polyneuropathy   . Primary insomnia     Medications:  See electronic med rec  Assessment: 78 y.o. M presents with SOB - acute exacerbation of CHF. Pt on coumadin PTA for afib. Admission INR 2.6 (therapeutic). Home dose: 2.5mg  daily except for 5mg  on Tues/Thur/Sat  Goal of Therapy:  INR 2-3 Monitor platelets by anticoagulation protocol: Yes   Plan: Continue home dose of coumadin: 2.5mg  daily except for 5mg  on Tues/Thur/Sat  Daily PT/INR  Sherlon Handing, PharmD, BCPS Please see amion for complete clinical pharmacist phone list 03/28/2019,10:32 PM

## 2019-03-28 NOTE — ED Triage Notes (Signed)
Pt c/o SOB and "crackles", feeling very tired, visual hallucinations, that started a little over a week ago. Pt saw his PCP, Dr. Quintin Alto, today and was told to come to ED for evaluation. Pt has swelling to BLE.

## 2019-03-28 NOTE — H&P (Addendum)
TRH H&P    Patient Demographics:    Noah [name], is a 78 y.o. male  MRN: [id number]  DOB - [birth year]  Admit Date - 03/28/2019  Referring MD/NP/PA: Aundria Mems  Outpatient Primary MD for the patient is Sasser, Silvestre Moment, MD  Patient coming from: Home  Chief complaint-shortness of breath   HPI:    Noah [name]  is a 78 y.o. male, with past medical history of CAD s/p prior PCI to LAD and RCA in 1990s with most recent being DES to left circumflex in 2008, chronic combined systolic and diastolic CHF, ischemic cardiomyopathy, EF 40 to 45% by echo in June 2019, s/p biventricular ICD placement, paroxysmal atrial fibrillation, stage III CKD, hypertension, hyperlipidemia, diabetes mellitus type 2, OSA who came to the hospital with worsening shortness of breath for past 1 week.  Patient is a poor historian, unable to provide good history.  He was recently seen by cardiology on 02/22/2019 at their office.  At that time also he had worsening leg swelling, he was told to take extra dose of Lasix for 3 days. Today patient complains of dyspnea on exertion, worsening lower extremity edema. He denies chest pain Complains of coughing He denies fever or chills. Denies being in contact with Covid patients. Denies nausea vomiting or diarrhea. Denies abdominal pain or dysuria In the ED, patient received Lasix 80 mg IV x1.  BNP was elevated 345.  Troponin 15. SARS-CoV-2 test was done, results are currently pending.   Review of systems:    In addition to the HPI above,    All other systems reviewed and are negative.    Past History of the following :    Past Medical History:  Diagnosis Date  . CAD S/P percutaneous coronary angioplasty    remote PCIs in 1990's- last CFX PCI 2008  . Chronic kidney disease, stage III (moderate)   . Diabetes mellitus with complication (HCC)    with hyperglycemia and diabetic autonomic poly  neuropathy  . Gastro-esophageal reflux disease with esophagitis   . Hyperlipidemia   . Hypertension   . Ischemic cardiomyopathy 01/20/2018   St Jude ICD   . Morbid obesity ([place])   . Obstructive sleep apnea    on CPAP  . Polyneuropathy   . Primary insomnia       Past Surgical History:  Procedure Laterality Date  . BIV ICD INSERTION CRT-D N/A 01/20/2018   Procedure: BIV ICD INSERTION CRT-D;  Surgeon: Evans Lance, MD;  Location: [facility];  Service: Cardiovascular;  Laterality: N/A;  . BIV PACEMAKER INSERTION CRT-P  01/20/2018   St Jude- Dr Lovena Le  . CARDIAC CATHETERIZATION     multiple angioplasty X 8, 4 stents       Social History:      Social History   Tobacco Use  . Smoking status: Former Smoker    Packs/day: 1.00    Years: 30.00    Pack years: 30.00    Quit date: 09/01/2000    Years since quitting: 18.5  . Smokeless tobacco:  Never Used  Substance Use Topics  . Alcohol use: Yes    Alcohol/week: 0.0 standard drinks       Family History :     Family History  Problem Relation Age of Onset  . Heart attack Father   . Breast cancer Mother   . CAD Mother   . Cancer Brother       Home Medications:   Prior to Admission medications   Medication Sig Start Date End Date Taking? Authorizing Provider  acetaminophen (TYLENOL) 650 MG CR tablet Take 1,300 mg by mouth at bedtime.    [provider]  aspirin EC 81 MG tablet Take 1 tablet (81 mg total) by mouth daily. 10/10/17   Laqueta Linden, MD  collagenase (SANTYL) ointment Apply 1 application topically daily. Wound measurements left ankle - 2.5 x 1.8 x 0.3cm, and 3.5 x 3.0 x 0.3cm 09/22/18   Vivi Barrack, DPM  cyclobenzaprine (FLEXERIL) 10 MG tablet Take 10 mg by mouth 3 (three) times daily as needed for spasms. 09/16/17   [provider]  famotidine (PEPCID) 20 MG tablet Take 20 mg by mouth at bedtime. 08/15/18   [provider]  finasteride (PROSCAR) 5 MG tablet Take 5  mg by mouth every evening.  08/13/17   [provider]  FLUoxetine (PROZAC) 20 MG capsule Take 20 mg by mouth every evening.    [provider]  furosemide (LASIX) 40 MG tablet Take 2 tabs (80mg ) by mouth every morning & 1 tab (40mg ) every afternoon 02/22/19   [place] Ouch, Lennart Pall, PA-C  gabapentin (NEURONTIN) 100 MG capsule Take 100 mg by mouth 2 (two) times daily.  10/05/17   [provider]  glucose blood (ONE TOUCH ULTRA TEST) test strip Check your sugars twice a day 06/06/12   Carlus Pavlov, MD  Insulin Glargine-Lixisenatide (SOLIQUA) 100-33 UNT-MCG/ML SOPN Inject 60 Units into the skin every evening.     [provider]  Insulin Pen Needle (NOVOFINE) 30G X 8 MM MISC Inject 10 each into the skin as needed. 06/20/12   Carlus Pavlov, MD  isosorbide mononitrate (IMDUR) 30 MG 24 hr tablet Take 30 mg by mouth daily.  12/22/17   [provider]  levETIRAcetam (KEPPRA) 750 MG tablet Take 750 mg by mouth 2 (two) times daily. 07/10/18   [provider]  metFORMIN (GLUCOPHAGE-XR) 500 MG 24 hr tablet Take 1,500 mg by mouth daily.    [provider]  metoprolol succinate (TOPROL-XL) 100 MG 24 hr tablet Take 100 mg by mouth daily. 01/02/19   [provider]  Omega-3 Fatty Acids (FISH OIL) 1000 MG CAPS Take 1,000 mg by mouth 2 (two) times daily.     [provider]  omeprazole (PRILOSEC) 20 MG capsule Take 20 mg by mouth daily.    [provider]  sacubitril-valsartan (ENTRESTO) 49-51 MG Take 1 tablet by mouth 2 (two) times daily. 08/15/18   Laqueta Linden, MD  silver sulfADIAZINE (SILVADENE) 1 % cream APPLY 1 CREAM TO SKIN ONCE A DAY 08/30/18   [provider]  simvastatin (ZOCOR) 80 MG tablet TAKE 80 MG (1 TABLET) DAILY. 11/15/18   Laqueta Linden, MD  Tamsulosin HCl (FLOMAX) 0.4 MG CAPS Take 0.8 mg by mouth every evening.    [provider]  warfarin (COUMADIN) 5 MG tablet Take 1/2 tablet daily  except 1 tablet on Tuesdays, Thursdays and Saturdays Patient taking differently: Take 1/2 tablet (2.5mg ) daily except 1 tablet (5mg ) on Tuesdays.  03/20/18   Laqueta LindenKoneswaran, Suresh A, MD     Allergies:     Allergies  Allergen Reactions  . Codeine Other (See Comments)    constipation  . Erythromycin-Sulfisoxazole Other (See Comments)    Causes infection in throat and eyes     Physical Exam:   Vitals  Blood pressure (!) 156/119, pulse 77, temperature 97.8 F (36.6 C), temperature source Oral, resp. rate 15, height 5\' 8"  (1.727 m), weight 131.5 kg, SpO2 96 %.  1.  General: Appears lethargic  2. Psychiatric: Alert, oriented x3, intact insight and judgment  3. Neurologic: Cranial nerves II through XII grossly intact, no focal deficit noted  4. HEENMT:  Atraumatic normocephalic, extraocular muscles are intact  5. Respiratory : Decreased breath sounds bilaterally, at lung bases  6. Cardiovascular : S1-S2, regular, no murmur auscultated, 3+ pitting edema bilaterally in lower extremities  7. Gastrointestinal:  Abdomen is soft, nontender, no organomegaly     Data Review:    CBC Recent Labs  Lab 03/28/19 1625  WBC 13.1*  HGB 12.0*  HCT 40.4  PLT 197  MCV 93.7  MCH 27.8  MCHC 29.7*  RDW 13.6  LYMPHSABS 1.7  MONOABS 0.9  EOSABS 0.3  BASOSABS 0.1   ------------------------------------------------------------------------------------------------------------------  Results for orders placed or performed during the hospital encounter of 03/28/19 (from the past 48 hour(s))  Protime-INR     Status: Abnormal   Collection Time: 03/28/19  4:25 PM  Result Value Ref Range   Prothrombin Time 27.8 (H) 11.4 - 15.2 seconds   INR 2.6 (H) 0.8 - 1.2    Comment: (NOTE) INR goal varies based on device and disease states. Performed at [facility], [address] [place], [place] [address]   Comprehensive metabolic panel     Status: Abnormal   Collection Time: 03/28/19  4:25 PM   Result Value Ref Range   Sodium 144 135 - 145 mmol/L   Potassium 4.4 3.5 - 5.1 mmol/L   Chloride 104 98 - 111 mmol/L   CO2 27 22 - 32 mmol/L   Glucose, Bld 127 (H) 70 - 99 mg/dL   BUN 46 (H) 8 - 23 mg/dL   Creatinine, Ser 6.041.64 (H) 0.61 - 1.24 mg/dL   Calcium 9.1 8.9 - 54.010.3 mg/dL   Total Protein 7.1 6.5 - 8.1 g/dL   Albumin 3.3 (L) 3.5 - 5.0 g/dL   AST 14 (L) 15 - 41 U/L   ALT 10 0 - 44 U/L   Alkaline Phosphatase 77 38 - 126 U/L   Total Bilirubin 0.3 0.3 - 1.2 mg/dL   GFR calc non Af Amer 39 (L) >60 mL/min   GFR calc Af Amer 46 (L) >60 mL/min   Anion gap 13 5 - 15    Comment: Performed at [facility], [address] [place], [place] [address]  CBC with Differential     Status: Abnormal   Collection Time: 03/28/19  4:25 PM  Result Value Ref Range   WBC 13.1 (H) 4.0 - 10.5 K/uL   RBC 4.31 4.22 - 5.81 MIL/uL   Hemoglobin 12.0 (L) 13.0 - 17.0 g/dL   HCT 14.740.4 82.939.0 - 56.252.0 %   MCV 93.7 80.0 - 100.0 fL   MCH 27.8 26.0 - 34.0 pg   MCHC 29.7 (L) 30.0 - 36.0 g/dL   RDW 13.013.6 86.511.5 - 78.415.5 %   Platelets 197 150 - 400 K/uL   nRBC 0.0 0.0 - 0.2 %   Neutrophils Relative % 77 %  Neutro Abs 10.2 (H) 1.7 - 7.7 K/uL   Lymphocytes Relative 13 %   Lymphs Abs 1.7 0.7 - 4.0 K/uL   Monocytes Relative 7 %   Monocytes Absolute 0.9 0.1 - 1.0 K/uL   Eosinophils Relative 2 %   Eosinophils Absolute 0.3 0.0 - 0.5 K/uL   Basophils Relative 1 %   Basophils Absolute 0.1 0.0 - 0.1 K/uL   Immature Granulocytes 0 %   Abs Immature Granulocytes 0.05 0.00 - 0.07 K/uL    Comment: Performed at [facility], [address], [place], [place] [address]  Brain natriuretic peptide     Status: Abnormal   Collection Time: 03/28/19  4:25 PM  Result Value Ref Range   B Natriuretic Peptide 345.0 (H) 0.0 - 100.0 pg/mL    Comment: Performed at [facility], [address] [place], [place] [address]  Troponin I (High Sensitivity)     Status: None   Collection Time: 03/28/19  4:25 PM  Result Value Ref Range   Troponin I  (High Sensitivity) 15 <18 ng/L    Comment: (NOTE) Elevated high sensitivity troponin I (hsTnI) values and significant  changes across serial measurements may suggest ACS but many other  chronic and acute conditions are known to elevate hsTnI results.  Refer to the "Links" section for chest pain algorithms and additional  guidance. Performed at [facility], [address] [place], [place] [address]     Chemistries  Recent Labs  Lab 03/28/19 1625  NA 144  K 4.4  CL 104  CO2 27  GLUCOSE 127*  BUN 46*  CREATININE 1.64*  CALCIUM 9.1  AST 14*  ALT 10  ALKPHOS 77  BILITOT 0.3   ------------------------------------------------------------------------------------------------------------------  ------------------------------------------------------------------------------------------------------------------ GFR: Estimated Creatinine Clearance: 49.1 mL/min (A) (by C-G formula based on SCr of 1.64 mg/dL (H)). Liver Function Tests: Recent Labs  Lab 03/28/19 1625  AST 14*  ALT 10  ALKPHOS 77  BILITOT 0.3  PROT 7.1  ALBUMIN 3.3*   No results for input(s): LIPASE, AMYLASE in the last 168 hours. No results for input(s): AMMONIA in the last 168 hours. Coagulation Profile: Recent Labs  Lab 03/28/19 1625  INR 2.6*    --------------------------------------------------------------------------------------------------------------- Urine analysis: No results found for: COLORURINE, APPEARANCEUR, LABSPEC, PHURINE, GLUCOSEU, HGBUR, BILIRUBINUR, KETONESUR, PROTEINUR, UROBILINOGEN, NITRITE, LEUKOCYTESUR    Imaging Results:    Dg Chest Port 1 View  Result Date: 03/28/2019 CLINICAL DATA:  Shortness of breath, history coronary artery disease post angioplasty, ischemic cardiomyopathy, diabetes mellitus, hypertension EXAM: PORTABLE CHEST 1 VIEW COMPARISON:  Portable exam 1655 hours compared to 02/16/2019 FINDINGS: LEFT subclavian ICD with leads projecting over RIGHT atrium, RIGHT  ventricle and coronary sinus. Enlargement of cardiac silhouette with pulmonary vascular congestion. Azygos fissure noted. Mediastinal contour stable. Minimal bibasilar atelectasis. Lungs otherwise clear. No pleural effusion or pneumothorax. IMPRESSION: Enlargement of cardiac silhouette with vascular congestion post AICD. Minimal bibasilar atelectasis. Electronically Signed   By: Ulyses Southward M.D.   On: 03/28/2019 17:02    My personal review of EKG: Rhythm paced rhythm   Assessment & Plan:    Active Problems:   Acute exacerbation of CHF (congestive heart failure) (HCC)   1. Acute exacerbation of chronic diastolic and systolic CHF-patient presented with acute on chronic diastolic and systolic CHF.  He was given Lasix 80 mg IV in the ED.  We will continue with Lasix 40 mg IV every 12 hours.  Currently requiring 2 L/min of oxygen via nasal cannula.  Strict intake and  output, daily weights, check BMP in a.m.  Continue Entresto.  2. Diabetes mellitus type 2-patient takes glargine- lixisenatide at home.  Will start Lantus 40 units subcu daily, sliding scale insulin with NovoLog.  Will need adjustment of Lantus and may need to be added on meal coverage based on patient's blood glucose in the hospital.  Hold Metformin.  3. History of seizures-continue Keppra.  4. Paroxysmal atrial fibrillation-continue Toprol-XL, Coumadin per pharmacy consultation.  5. CAD s/p PCI to LAD, RCA, DES to LCx-stable, continue aspirin, Coumadin.  Cycle troponin x2.  6. Hypertension-blood pressures controlled, continue Toprol-XL, Entresto.  7. Stage III CKD-creatinine is 1.64, at baseline.  Patient started on Lasix as above.  Follow BMP in a.m.  8. OSA-continue CPAP at bedtime.   DVT Prophylaxis-     AM Labs Ordered, also please review Full Orders  Family Communication: Admission, patients condition and plan of care including tests being ordered have been discussed with the patient  who indicate understanding and  agree with the plan and Code Status.  Code Status: Full code  Admission status: Inpatient: Based on patients clinical presentation and evaluation of above clinical data, I have made determination that patient meets Inpatient criteria at this time.  Time spent in minutes : 60 minutes   Meredeth IdeGagan S  M.D on 03/28/2019 at 9:30 PM

## 2019-03-28 NOTE — ED Notes (Signed)
Noah [name] [phone] daughter.

## 2019-03-29 DIAGNOSIS — I480 Paroxysmal atrial fibrillation: Secondary | ICD-10-CM

## 2019-03-29 DIAGNOSIS — Z950 Presence of cardiac pacemaker: Secondary | ICD-10-CM

## 2019-03-29 DIAGNOSIS — I5043 Acute on chronic combined systolic (congestive) and diastolic (congestive) heart failure: Secondary | ICD-10-CM

## 2019-03-29 DIAGNOSIS — E785 Hyperlipidemia, unspecified: Secondary | ICD-10-CM

## 2019-03-29 DIAGNOSIS — G4733 Obstructive sleep apnea (adult) (pediatric): Secondary | ICD-10-CM

## 2019-03-29 DIAGNOSIS — I2510 Atherosclerotic heart disease of native coronary artery without angina pectoris: Secondary | ICD-10-CM

## 2019-03-29 DIAGNOSIS — I255 Ischemic cardiomyopathy: Secondary | ICD-10-CM

## 2019-03-29 DIAGNOSIS — Z9861 Coronary angioplasty status: Secondary | ICD-10-CM

## 2019-03-29 LAB — BASIC METABOLIC PANEL
Anion gap: 13 (ref 5–15)
BUN: 46 mg/dL — ABNORMAL HIGH (ref 8–23)
CO2: 28 mmol/L (ref 22–32)
Calcium: 9.2 mg/dL (ref 8.9–10.3)
Chloride: 105 mmol/L (ref 98–111)
Creatinine, Ser: 1.61 mg/dL — ABNORMAL HIGH (ref 0.61–1.24)
GFR calc Af Amer: 47 mL/min — ABNORMAL LOW (ref >60)
GFR calc non Af Amer: 40 mL/min — ABNORMAL LOW (ref >60)
Glucose, Bld: 84 mg/dL (ref 70–99)
Potassium: 4.4 mmol/L (ref 3.5–5.1)
Sodium: 146 mmol/L — ABNORMAL HIGH (ref 135–145)

## 2019-03-29 LAB — HEMOGLOBIN A1C
Hgb A1c MFr Bld: 8.7 % — ABNORMAL HIGH (ref 4.8–5.6)
Mean Plasma Glucose: 202.99 mg/dL

## 2019-03-29 LAB — SARS CORONAVIRUS 2 (TAT 6-24 HRS): SARS Coronavirus 2: NEGATIVE

## 2019-03-29 LAB — GLUCOSE, CAPILLARY
Glucose-Capillary: 122 mg/dL — ABNORMAL HIGH (ref 70–99)
Glucose-Capillary: 134 mg/dL — ABNORMAL HIGH (ref 70–99)
Glucose-Capillary: 158 mg/dL — ABNORMAL HIGH (ref 70–99)
Glucose-Capillary: 247 mg/dL — ABNORMAL HIGH (ref 70–99)
Glucose-Capillary: 61 mg/dL — ABNORMAL LOW (ref 70–99)
Glucose-Capillary: 67 mg/dL — ABNORMAL LOW (ref 70–99)
Glucose-Capillary: 84 mg/dL (ref 70–99)

## 2019-03-29 LAB — TROPONIN I (HIGH SENSITIVITY)
Troponin I (High Sensitivity): 18 ng/L — ABNORMAL HIGH (ref <18)
Troponin I (High Sensitivity): 17 ng/L (ref <18)

## 2019-03-29 LAB — PROTIME-INR
INR: 2.7 — ABNORMAL HIGH (ref 0.8–1.2)
Prothrombin Time: 28.2 seconds — ABNORMAL HIGH (ref 11.4–15.2)

## 2019-03-29 LAB — BRAIN NATRIURETIC PEPTIDE: B Natriuretic Peptide: 286 pg/mL — ABNORMAL HIGH (ref 0.0–100.0)

## 2019-03-29 MED ORDER — ORAL CARE MOUTH RINSE
15.0000 mL | Freq: Two times a day (BID) | OROMUCOSAL | Status: DC
  Administered 2019-03-29 – 2019-03-31 (×6): 15 mL via OROMUCOSAL

## 2019-03-29 MED ORDER — INSULIN GLARGINE 100 UNIT/ML ~~LOC~~ SOLN
30.0000 [IU] | Freq: Every day | SUBCUTANEOUS | Status: DC
  Administered 2019-03-29 – 2019-03-30 (×2): 30 [IU] via SUBCUTANEOUS
  Filled 2019-03-29 (×4): qty 0.3

## 2019-03-29 NOTE — Plan of Care (Signed)
  Problem: Acute Rehab PT Goals(only PT should resolve) Goal: Pt Will Go Supine/Side To Sit Outcome: Progressing Flowsheets (Taken 03/29/2019 1501) Pt will go Supine/Side to Sit: Independently Goal: Patient Will Perform Sitting Balance Outcome: Progressing Flowsheets (Taken 03/29/2019 1501) Patient will perform sitting balance: with modified independence Goal: Patient Will Transfer Sit To/From Stand Outcome: Progressing Flowsheets (Taken 03/29/2019 1501) Patient will transfer sit to/from stand: with modified independence Goal: Pt Will Ambulate Outcome: Progressing Flowsheets (Taken 03/29/2019 1501) Pt will Ambulate:  75 feet  with modified independence  with cane   3:01 PM, 03/29/19 Lonell Grandchild, MPT Physical Therapist with [facility] 336 [phone] office [phone] mobile phone

## 2019-03-29 NOTE — Progress Notes (Signed)
PROGRESS NOTE [facility]   Noah [name]  FKC:[id number]  DOB: 1940/06/16  DOA: 03/28/2019 PCP: Estanislado Pandy, MD  Brief Admission Hx: 78 y.o. male, with past medical history of CAD s/p prior PCI to LAD and RCA in 1990s with most recent being DES to left circumflex in 2008, chronic combined systolic and diastolic CHF, ischemic cardiomyopathy, EF 40 to 45% by echo in June 2019, s/p biventricular ICD placement, paroxysmal atrial fibrillation, stage III CKD, hypertension, hyperlipidemia, diabetes mellitus type 2, OSA who came to the hospital with worsening shortness of breath for past 1 week.  He says that he is only been taking his morning Lasix and has been skipping the afternoon dose for the past couple of weeks.  MDM/Assessment & Plan:   1. Acute biventricular CHF exacerbation-He admits that he has only been taking his Lasix once daily.  Patient is on IV Lasix 40 mg every 12 hours and seems to be diuresing.  He has been restarted on his home heart care medications.  We are following his electrolytes closely.  We are following weights and intake and output closely. 2. Type 2 diabetes mellitus-patient had some hypoglycemia early this morning and has not been eating well.  We have reduced his Lantus and NovoLog coverage.  Metformin being Patron.  Monitor CBG closely. 3. 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 4. Paroxysmal atrial fibrillation-he is on Toprol-XL for rate control and warfarin for full anticoagulation and we have asked the Pharm.D. to assist with warfarin dosing. 5. CAD status post PCI to LAD, RCA, DES to LCx-stable continue aspirin warfarin and troponins have been negative. 6. Essential hypertension-he has been resumed on his home blood pressure medications. 7. Stage IIIb CKD-following closely while diuresing with IV Lasix. 8. OSA-reordered CPAP at bedtime-patient reports that he is not using it consistently at home.  He was encouraged to use  it regularly.  DVT prophylaxis: Warfarin Code Status: Full Family Communication: Patient updated at bedside, verbalized understanding Disposition Plan: Continue inpatient treatments   Consultants:    Procedures:    Antimicrobials:     Subjective: Patient reports that he has been urinating frequently since starting IV Lasix.  He is starting to have less shortness of breath.  Objective: Vitals:   03/28/19 2200 03/29/19 0000 03/29/19 0428 03/29/19 1058  BP: (!) 156/101 (!) 151/75 (!) 155/69 136/69  Pulse: 79   71  Resp: 17     Temp: 97.8 F (36.6 C) 97.9 F (36.6 C) 97.8 F (36.6 C)   TempSrc:  Axillary    SpO2: 93% 96% 95% 90%  Weight: 129 kg     Height: 5\' 8"  (1.727 m)       Intake/Output Summary (Last 24 hours) at 03/29/2019 1315 Last data filed at 03/29/2019 1100 Gross per 24 hour  Intake 120 ml  Output 1550 ml  Net -1430 ml   Filed Weights   03/28/19 1553 03/28/19 2200  Weight: 131.5 kg 129 kg     REVIEW OF SYSTEMS  As per history otherwise all reviewed and reported negative  Exam:  General exam: Morbidly obese male sitting up in bed in no apparent distress he is alert and cooperative. Respiratory system: Bibasilar crackles.  No increased work of breathing. Cardiovascular system: Normal S1 & S2 heard.  2+ edema bilateral lower extremities. Gastrointestinal system: Abdomen is nondistended, soft and nontender. Normal bowel sounds heard. Central nervous system: Alert and oriented. No focal neurological deficits. Extremities:  2+ edema bilateral lower extremities.  Data Reviewed: Basic Metabolic Panel: Recent Labs  Lab 03/28/19 1625 03/29/19 0325  NA 144 146*  K 4.4 4.4  CL 104 105  CO2 27 28  GLUCOSE 127* 84  BUN 46* 46*  CREATININE 1.64* 1.61*  CALCIUM 9.1 9.2   Liver Function Tests: Recent Labs  Lab 03/28/19 1625  AST 14*  ALT 10  ALKPHOS 77  BILITOT 0.3  PROT 7.1  ALBUMIN 3.3*   No results for input(s): LIPASE, AMYLASE in the  last 168 hours. No results for input(s): AMMONIA in the last 168 hours. CBC: Recent Labs  Lab 03/28/19 1625  WBC 13.1*  NEUTROABS 10.2*  HGB 12.0*  HCT 40.4  MCV 93.7  PLT 197   Cardiac Enzymes: No results for input(s): CKTOTAL, CKMB, CKMBINDEX, TROPONINI in the last 168 hours. CBG (last 3)  Recent Labs    03/29/19 0759 03/29/19 0857 03/29/19 1148  GLUCAP 61* 134* 122*   No results found for this or any previous visit (from the past 240 hour(s)).   Studies: DG Chest Port 1 View  Result Date: 03/28/2019 CLINICAL DATA:  Shortness of breath, history coronary artery disease post angioplasty, ischemic cardiomyopathy, diabetes mellitus, hypertension EXAM: PORTABLE CHEST 1 VIEW COMPARISON:  Portable exam 1655 hours compared to 02/16/2019 FINDINGS: LEFT subclavian ICD with leads projecting over RIGHT atrium, RIGHT ventricle and coronary sinus. Enlargement of cardiac silhouette with pulmonary vascular congestion. Azygos fissure noted. Mediastinal contour stable. Minimal bibasilar atelectasis. Lungs otherwise clear. No pleural effusion or pneumothorax. IMPRESSION: Enlargement of cardiac silhouette with vascular congestion post AICD. Minimal bibasilar atelectasis. Electronically Signed   By: Lavonia Dana M.D.   On: 03/28/2019 17:02   Scheduled Meds: . acetaminophen  975 mg Oral QHS  . aspirin EC  81 mg Oral Daily  . atorvastatin  40 mg Oral q1800  . famotidine  20 mg Oral QHS  . finasteride  5 mg Oral QPM  . FLUoxetine  20 mg Oral QPM  . furosemide  40 mg Intravenous Q12H  . gabapentin  100 mg Oral BID  . insulin aspart  0-9 Units Subcutaneous TID WC  . insulin glargine  30 Units Subcutaneous QHS  . isosorbide mononitrate  30 mg Oral Daily  . levETIRAcetam  750 mg Oral BID  . mouth rinse  15 mL Mouth Rinse BID  . metoprolol succinate  100 mg Oral Daily  . sacubitril-valsartan  1 tablet Oral BID  . sodium chloride flush  3 mL Intravenous Q12H  . [START ON 03/30/2019] warfarin  2.5  mg Oral Once per day on Sun Mon Wed Fri  . warfarin  5 mg Oral Once per day on Tue Thu Sat  . Warfarin - Pharmacist Dosing Inpatient   Does not apply q1800   Continuous Infusions: . sodium chloride      Active Problems:   Dyslipidemia, goal LDL below 70   SOB (shortness of breath)   DM (diabetes mellitus), type 2 with complications (HCC)   Ischemic cardiomyopathy   Biventricular cardiac pacemaker implanted 01/20/18   CAD S/P percutaneous coronary angioplasty   Atrial fibrillation (HCC)   CKD (chronic kidney disease) stage 3, GFR 30-59 ml/min   OSA (obstructive sleep apnea)   Acute exacerbation of CHF (congestive heart failure) ([place])   Acute on chronic combined systolic and diastolic congestive heart failure ([place])  Time spent:   Irwin Brakeman, MD [facility] 03/29/2019, 1:15 PM    LOS: 1 day  How  to contact the [facility] Attending or Consulting provider [place] or covering provider during after hours [place], for this patient?  1. Check the care team in [facility] and look for a) attending/consulting TR[facility] provider listed and b) the [facility] team listed 2. Log into [email or web address] and use [place]'s universal password to access. If you do not have the password, please contact the hospital operator. 3. Locate the [facility] provider you are looking for under [facility] and page to a number that you can be directly reached. 4. If you still have difficulty reaching the provider, please page the [facility] (Director on Call) for the Hospitalists listed on amion for assistance.

## 2019-03-29 NOTE — Plan of Care (Signed)

## 2019-03-29 NOTE — Evaluation (Signed)
Physical Therapy Evaluation Patient Details Name: Noah [name] MRN: [id number] DOB: 06-15-1940 Today's Date: 03/29/2019   History of Present Illness  Noah [name]  is a 78 y.o. male, with past medical history of CAD s/p prior PCI to LAD and RCA in 1990s with most recent being DES to left circumflex in 2008, chronic combined systolic and diastolic CHF, ischemic cardiomyopathy, EF 40 to 45% by echo in June 2019, s/p biventricular ICD placement, paroxysmal atrial fibrillation, stage III CKD, hypertension, hyperlipidemia, diabetes mellitus type 2, OSA who came to the hospital with worsening shortness of breath for past 1 week.  Patient is a poor historian, unable to provide good history.  He was recently seen by cardiology on 02/22/2019 at their office.  At that time also he had worsening leg swelling, he was told to take extra dose of Lasix for 3 days.    Clinical Impression  Patient functioning near baseline for functional mobility and gait, demonstrates slightly labored movement for sitting up at bedside, sit to stands and transferring to chair.  Patient ambulated in room without loss of balance while on room air with SpO2 dropping from 92% to 80&, put back on 2 LPM with SpO2 increasing to 95% while seated in chair - RN notified.  Patient will benefit from continued physical therapy in hospital and recommended venue below to increase strength, balance, endurance for safe ADLs and gait.     Follow Up Recommendations Home health PT;Supervision for mobility/OOB;Supervision - Intermittent    Equipment Recommendations  None recommended by PT    Recommendations for Other Services       Precautions / Restrictions Precautions Precautions: Fall Restrictions Weight Bearing Restrictions: No      Mobility  Bed Mobility Overal bed mobility: Modified Independent             General bed mobility comments: increased time  Transfers Overall transfer level: Needs assistance Equipment used:  Straight cane Transfers: Sit to/from Stand;Stand Pivot Transfers Sit to Stand: Supervision Stand pivot transfers: Supervision       General transfer comment: slightly labored movement  Ambulation/Gait Ambulation/Gait assistance: Supervision Gait Distance (Feet): 50 Feet Assistive device: Straight cane Gait Pattern/deviations: Step-through pattern;Decreased step length - left;Decreased stance time - right;Decreased stride length Gait velocity: decreased   General Gait Details: slightly labored cadence without loss of balance with good return for using [facility]  Stairs            Wheelchair Mobility    Modified Rankin (Stroke Patients Only)       Balance Overall balance assessment: Needs assistance Sitting-balance support: Feet supported;No upper extremity supported Sitting balance-Leahy Scale: Good Sitting balance - Comments: seated at bedside   Standing balance support: During functional activity;Single extremity supported Standing balance-Leahy Scale: Fair Standing balance comment: fair/good using SPC                             Pertinent Vitals/Pain Pain Assessment: No/denies pain    Home Living Family/patient expects to be discharged to:: Private residence Living Arrangements: Spouse/significant other Available Help at Discharge: Family;Available 24 hours/day Type of Home: House Home Access: Stairs to enter Entrance Stairs-Rails: None Entrance Stairs-Number of Steps: 2 Home Layout: Two level;Bed/bath upstairs Home Equipment: Cane - single point;Walker - 4 wheels Additional Comments: most information per patient    Prior Function Level of Independence: Independent with assistive device(s)         Comments: [place] ambulator with  SPC, does not drive     Hand Dominance   Dominant Hand: Right    Extremity/Trunk Assessment   Upper Extremity Assessment Upper Extremity Assessment: Overall WFL for tasks assessed    Lower Extremity  Assessment Lower Extremity Assessment: Generalized weakness    Cervical / Trunk Assessment Cervical / Trunk Assessment: Normal  Communication   Communication: HOH  Cognition Arousal/Alertness: Awake/alert Behavior During Therapy: WFL for tasks assessed/performed Overall Cognitive Status: Within Functional Limits for tasks assessed                                        General Comments      Exercises     Assessment/Plan    PT Assessment Patient needs continued PT services  PT Problem List Decreased strength;Decreased activity tolerance;Decreased balance;Decreased mobility       PT Treatment Interventions Balance training;Gait training;Stair training;Functional mobility training;Therapeutic activities;Therapeutic exercise;Patient/family education    PT Goals (Current goals can be found in the Care Plan section)  Acute Rehab PT Goals Patient Stated Goal: return home with family to assist PT Goal Formulation: With patient Time For Goal Achievement: 04/05/19 Potential to Achieve Goals: Good    Frequency Min 3X/week   Barriers to discharge        Co-evaluation               AM-PAC PT "6 Clicks" Mobility  Outcome Measure Help needed turning from your back to your side while in a flat bed without using bedrails?: None Help needed moving from lying on your back to sitting on the side of a flat bed without using bedrails?: None Help needed moving to and from a bed to a chair (including a wheelchair)?: A Little Help needed standing up from a chair using your arms (e.g., wheelchair or bedside chair)?: A Little Help needed to walk in hospital room?: A Little Help needed climbing 3-5 steps with a railing? : A Lot 6 Click Score: 19    End of Session   Activity Tolerance: Patient tolerated treatment well;Patient limited by fatigue Patient left: in chair;with call bell/phone within reach;with chair alarm set Nurse Communication: Mobility status PT  Visit Diagnosis: Unsteadiness on feet (R26.81);Other abnormalities of gait and mobility (R26.89);Muscle weakness (generalized) (M62.81)    Time: 2703-5009 PT Time Calculation (min) (ACUTE ONLY): 34 min   Charges:   PT Evaluation $PT Eval Moderate Complexity: 1 Mod PT Treatments $Therapeutic Activity: 23-37 mins        3:00 PM, 03/29/19 Ocie Bob, MPT Physical Therapist with [facility] [phone] office [phone] mobile phone

## 2019-03-29 NOTE — Progress Notes (Signed)
Hypoglycemic Event  CBG: 61  Treatment: 4 oz juice/soda meal tray eaten  Symptoms: None  Follow-up CBG: [id number] CBG Result:134  Possible Reasons for Event: Inadequate meal intake  Comments/MD notified:none    [facility]

## 2019-03-29 NOTE — Progress Notes (Signed)
[place] for Coumadin Indication: atrial fibrillation  Allergies  Allergen Reactions  . Codeine Other (See Comments)    constipation  . Erythromycin-Sulfisoxazole Other (See Comments)    Causes infection in throat and eyes    Patient Measurements: Height: 5\' 8"  (172.7 cm) Weight: 284 lb 6.3 oz (129 kg) IBW/kg (Calculated) : 68.4  Vital Signs: Temp: 97.8 F (36.6 C) (12/10 0428) Temp Source: Axillary (12/10 0000) BP: 155/69 (12/10 0428) Pulse Rate: 79 (12/09 2200)  Labs: Recent Labs    03/28/19 1625 03/28/19 2019 03/29/19 0040 03/29/19 0325  HGB 12.0*  --   --   --   HCT 40.4  --   --   --   PLT 197  --   --   --   LABPROT 27.8*  --   --  28.2*  INR 2.6*  --   --  2.7*  CREATININE 1.64*  --   --  1.61*  TROPONINIHS 15 17 18* 17    Estimated Creatinine Clearance: 49.5 mL/min (A) (by C-G formula based on SCr of 1.61 mg/dL (H)).   Medical History: Past Medical History:  Diagnosis Date  . CAD S/P percutaneous coronary angioplasty    remote PCIs in 1990's- last CFX PCI 2008  . Chronic kidney disease, stage III (moderate)   . Diabetes mellitus with complication (HCC)    with hyperglycemia and diabetic autonomic poly neuropathy  . Gastro-esophageal reflux disease with esophagitis   . Hyperlipidemia   . Hypertension   . Ischemic cardiomyopathy 01/20/2018   St Jude ICD   . Morbid obesity ([place])   . Obstructive sleep apnea    on CPAP  . Polyneuropathy   . Primary insomnia     Medications:  See electronic med rec  Assessment: 78 y.o. M presents with SOB - acute exacerbation of CHF. Pt on coumadin PTA for afib. Home dose: 2.5mg  daily except for 5mg  on Tues/Thur/Sat  INR therapeutic at 2.7  Goal of Therapy:  INR 2-3 Monitor platelets by anticoagulation protocol: Yes   Plan: Warfarin 5 mg x 1 dose.  Monitor daily INR and s/s of bleeding  Margot Ables, PharmD Clinical Pharmacist 03/29/2019 8:31 AM

## 2019-03-29 NOTE — Progress Notes (Addendum)
Hypoglycemic Event  CBG: 67  Treatment: 4 oz juice  Symptoms: none  Follow-up CBG: Time:0035 CBG Result: 84  Possible Reasons for Event: no intake  Comments/MD notified: Nechama Guard

## 2019-03-30 LAB — BASIC METABOLIC PANEL
Anion gap: 11 (ref 5–15)
BUN: 50 mg/dL — ABNORMAL HIGH (ref 8–23)
CO2: 29 mmol/L (ref 22–32)
Calcium: 8.8 mg/dL — ABNORMAL LOW (ref 8.9–10.3)
Chloride: 102 mmol/L (ref 98–111)
Creatinine, Ser: 1.67 mg/dL — ABNORMAL HIGH (ref 0.61–1.24)
GFR calc Af Amer: 45 mL/min — ABNORMAL LOW (ref >60)
GFR calc non Af Amer: 39 mL/min — ABNORMAL LOW (ref >60)
Glucose, Bld: 230 mg/dL — ABNORMAL HIGH (ref 70–99)
Potassium: 4.3 mmol/L (ref 3.5–5.1)
Sodium: 142 mmol/L (ref 135–145)

## 2019-03-30 LAB — GLUCOSE, CAPILLARY
Glucose-Capillary: 180 mg/dL — ABNORMAL HIGH (ref 70–99)
Glucose-Capillary: 163 mg/dL — ABNORMAL HIGH (ref 70–99)
Glucose-Capillary: 197 mg/dL — ABNORMAL HIGH (ref 70–99)

## 2019-03-30 LAB — CBC
HCT: 37.3 % — ABNORMAL LOW (ref 39.0–52.0)
Hemoglobin: 11 g/dL — ABNORMAL LOW (ref 13.0–17.0)
MCH: 28 pg (ref 26.0–34.0)
MCHC: 29.5 g/dL — ABNORMAL LOW (ref 30.0–36.0)
MCV: 94.9 fL (ref 80.0–100.0)
Platelets: 170 10*3/uL (ref 150–400)
RBC: 3.93 MIL/uL — ABNORMAL LOW (ref 4.22–5.81)
RDW: 13.2 % (ref 11.5–15.5)
WBC: 9.2 10*3/uL (ref 4.0–10.5)
nRBC: 0 % (ref 0.0–0.2)

## 2019-03-30 LAB — PROTIME-INR
INR: 3.3 — ABNORMAL HIGH (ref 0.8–1.2)
Prothrombin Time: 33.3 seconds — ABNORMAL HIGH (ref 11.4–15.2)

## 2019-03-30 LAB — MAGNESIUM: Magnesium: 1.9 mg/dL (ref 1.7–2.4)

## 2019-03-30 NOTE — Progress Notes (Signed)
Patient declined CPAP for tonight stated he had oxygen as was ok.

## 2019-03-30 NOTE — TOC Initial Note (Signed)
Transition of Care [facility]) - Initial/Assessment Note    Patient Details  Name: Noah [name] MRN: [id number] Date of Birth: 09/07/1940  Transition of Care [facility]) CM/SW Contact:    Ihor Gully, LCSW Phone Number: 03/30/2019, 3:59 PM  Clinical Narrative:                 Patient from home with spouse. At baseline uses a cane infrequently  No DME.  Discussed PT evaluation and recommendation. Made referral.  Left referrall message for [facility] in [place] per family request for counseling. Office not open. Added information to AVS for family to call and schedule. Attending to place order for nutrition and diabetes consult. Family to contact cardiologist regarding desire for patient to have cardiac rehab.  Advised of possibility of home with oxygen. Advised of expected discharge on 03/31/2019.  Expected Discharge Plan: [place] Barriers to Discharge: Continued Medical Work up   Patient Goals and CMS Choice Patient states their goals for this hospitalization and ongoing recovery are:: Go home with support.   Choice offered to / list presented to : Spouse  Expected Discharge Plan and Services Expected Discharge Plan: [place] Choice: [place] arrangements for the past 2 months: Single Family Home                           HH Arranged: PT [place]: [place] Date [place]: 03/30/19 Time HH Agency Contacted: [phone] Representative spoke with at [place]: Tommi Rumps  Prior Living Arrangements/Services Living arrangements for the past 2 months: [place] with:: Spouse Patient language and need for interpreter reviewed:: Yes Do you feel safe going back to the place where you live?: Yes      Need for Family Participation in Patient Care: Yes (Comment) Care giver support system in place?: Yes (comment) Current home services: Other (comment)([facility]) Criminal  Activity/Legal Involvement Pertinent to Current Situation/Hospitalization: No - Comment as needed  Activities of Daily Living Home Assistive Devices/Equipment: Cane (specify quad or straight) ADL Screening (condition at time of admission) Patient's cognitive ability adequate to safely complete daily activities?: Yes Is the patient deaf or have difficulty hearing?: Yes Does the patient have difficulty seeing, even when wearing glasses/contacts?: No Does the patient have difficulty concentrating, remembering, or making decisions?: No Patient able to express need for assistance with ADLs?: Yes Does the patient have difficulty dressing or bathing?: No Independently performs ADLs?: Yes (appropriate for developmental age) Does the patient have difficulty walking or climbing stairs?: Yes Weakness of Legs: Both Weakness of Arms/Hands: None  Permission Sought/Granted Permission sought to share information with : Family Supports          Permission granted to share info w Relationship: spouse, Mrs. Zahner; dtr. Leda Roys     Emotional Assessment Appearance:: Appears stated age     Orientation: : Oriented to Self, Oriented to Place, Oriented to  Time, Oriented to Situation   Psych Involvement: No (comment)  Admission diagnosis:  SOB (shortness of breath) [R06.02] Hypoxia [R09.02] Acute on chronic combined systolic and diastolic congestive heart failure (HCC) [I50.43] Acute exacerbation of CHF (congestive heart failure) ([place]) [I50.9] Patient Active Problem List   Diagnosis Date Noted  . Acute on chronic combined systolic and diastolic congestive heart failure ([place]) 03/29/2019  . Acute exacerbation of CHF (congestive heart failure) ([place])  03/28/2019  . Fall at home, initial encounter 05/18/2018  . Syncope 05/18/2018  . CKD (chronic kidney disease) stage 3, GFR 30-59 ml/min 05/18/2018  . OSA (obstructive sleep apnea) 05/18/2018  . Subarachnoid hemorrhage (HCC) 05/17/2018  . Atrial  fibrillation (HCC) 02/21/2018  . Encounter for therapeutic drug monitoring 02/21/2018  . Ischemic cardiomyopathy 01/21/2018  . 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 CAD S/P percutaneous coronary angioplasty 01/21/2018  . Chronic systolic (congestive) heart failure (HCC) 01/20/2018  . 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 Dyslipidemia, goal LDL below 70 01/16/2009  . OVERWEIGHT/OBESITY 01/16/2009  . SOB (shortness of breath) 01/16/2009   PCP:  Estanislado Pandy, MD Pharmacy:   [facility] [id number] [place] [place] - [name] [place] [address] Phone: [phone] Fax: [phone]  [facility] [address] [place] - [address] [place] [address] Phone: [phone] Fax: [phone]     Social Determinants of Health (SDOH) Interventions    Readmission Risk Interventions No flowsheet data found.

## 2019-03-30 NOTE — Progress Notes (Signed)
[place] for Coumadin Indication: atrial fibrillation  Allergies  Allergen Reactions  . Codeine Other (See Comments)    constipation  . Erythromycin-Sulfisoxazole Other (See Comments)    Causes infection in throat and eyes    Patient Measurements: Height: 5\' 8"  (172.7 cm) Weight: 288 lb 9.3 oz (130.9 kg) IBW/kg (Calculated) : 68.4  Vital Signs: BP: 161/66 (12/11 0630) Pulse Rate: 69 (12/11 0630)  Labs: Recent Labs    03/28/19 1625 03/28/19 2019 03/29/19 0040 03/29/19 0325 03/30/19 0644  HGB 12.0*  --   --   --  11.0*  HCT 40.4  --   --   --  37.3*  PLT 197  --   --   --  170  LABPROT 27.8*  --   --  28.2* 33.3*  INR 2.6*  --   --  2.7* 3.3*  CREATININE 1.64*  --   --  1.61* 1.67*  TROPONINIHS 15 17 18* 17  --     Estimated Creatinine Clearance: 48.2 mL/min (A) (by C-G formula based on SCr of 1.67 mg/dL (H)).   Medical History: Past Medical History:  Diagnosis Date  . CAD S/P percutaneous coronary angioplasty    remote PCIs in 1990's- last CFX PCI 2008  . Chronic kidney disease, stage III (moderate)   . Diabetes mellitus with complication (HCC)    with hyperglycemia and diabetic autonomic poly neuropathy  . Gastro-esophageal reflux disease with esophagitis   . Hyperlipidemia   . Hypertension   . Ischemic cardiomyopathy 01/20/2018   St Jude ICD   . Morbid obesity ([place])   . Obstructive sleep apnea    on CPAP  . Polyneuropathy   . Primary insomnia     Medications:  See electronic med rec  Assessment: 78 y.o. M presents with SOB - acute exacerbation of CHF. Pt on coumadin PTA for afib. Home dose: 2.5mg  daily except for 5mg  on Tues/Thur/Sat  INR elevated at 3.3  Goal of Therapy:  INR 2-3 Monitor platelets by anticoagulation protocol: Yes   Plan: No coumadin today.  Monitor daily INR and s/s of bleeding  Isac Sarna, BS [place], [place] Clinical Pharmacist Pager [phone] 03/30/2019 10:19 AM

## 2019-03-30 NOTE — Progress Notes (Signed)
Physical Therapy Treatment Patient Details Name: Noah [name] MRN: [id number] DOB: Jan 14, 1941 Today's Date: 03/30/2019    History of Present Illness Noah [name]  is a 78 y.o. male, with past medical history of CAD s/p prior PCI to LAD and RCA in 1990s with most recent being DES to left circumflex in 2008, chronic combined systolic and diastolic CHF, ischemic cardiomyopathy, EF 40 to 45% by echo in June 2019, s/p biventricular ICD placement, paroxysmal atrial fibrillation, stage III CKD, hypertension, hyperlipidemia, diabetes mellitus type 2, OSA who came to the hospital with worsening shortness of breath for past 1 week.  Patient is a poor historian, unable to provide good history.  He was recently seen by cardiology on 02/22/2019 at their office.  At that time also he had worsening leg swelling, he was told to take extra dose of Lasix for 3 days.    PT Comments    Patient demonstrates increased endurance/distance for ambulation in room/hallways without loss of balance while on 2 LPM O2 with SpO2 dropping from 95% tot 88%.  Patient completed exercises while seated at bedside on room air with SpO2 at 86-87%, instructed in pursed lipped breathing with fair carryover due to frequent mouth breathing and demonstrates good return for transferring to commode in bathroom using grab bar.  Patient tolerated sitting up in chair after therapy - NT notified.  Patient will benefit from continued physical therapy in hospital and recommended venue below to increase strength, balance, endurance for safe ADLs and gait.    Follow Up Recommendations  Home health PT;Supervision for mobility/OOB;Supervision - Intermittent     Equipment Recommendations  None recommended by PT    Recommendations for Other Services       Precautions / Restrictions Precautions Precautions: Fall Restrictions Weight Bearing Restrictions: No    Mobility  Bed Mobility Overal bed mobility: Needs Assistance Bed Mobility: Supine  to Sit     Supine to sit: Supervision;HOB elevated     General bed mobility comments: increased time, head of bed raised approximately 30 degrees and use of bed rail  Transfers Overall transfer level: Needs assistance Equipment used: Straight cane Transfers: Sit to/from Stand;Stand Pivot Transfers Sit to Stand: Supervision;Modified independent (Device/Increase time) Stand pivot transfers: Supervision;Modified independent (Device/Increase time)       General transfer comment: slightly labored movement  Ambulation/Gait Ambulation/Gait assistance: Supervision Gait Distance (Feet): 125 Feet Assistive device: Straight cane Gait Pattern/deviations: Step-through pattern;Decreased step length - left;Decreased stance time - right;Decreased stride length Gait velocity: decreased   General Gait Details: increased endurance/distance for ambulation with slighty labored cadence, on 2 LPM O2 with SpO2 at 88%, dropped from 95%   Stairs             Wheelchair Mobility    Modified Rankin (Stroke Patients Only)       Balance Overall balance assessment: Needs assistance Sitting-balance support: Feet supported;No upper extremity supported Sitting balance-Leahy Scale: Good Sitting balance - Comments: seated at bedside   Standing balance support: During functional activity;Single extremity supported Standing balance-Leahy Scale: Fair Standing balance comment: fair/good using SPC                            Cognition Arousal/Alertness: Awake/alert Behavior During Therapy: WFL for tasks assessed/performed Overall Cognitive Status: Within Functional Limits for tasks assessed  Exercises General Exercises - Lower Extremity Long Arc Quad: Seated;AROM;Strengthening;Both;10 reps Hip ABduction/ADduction: Seated;AROM;Strengthening;Both;10 reps Straight Leg Raises: Seated;AROM;Strengthening;Both;10 reps Hip  Flexion/Marching: Seated;AROM;Strengthening;Both;10 reps    General Comments        Pertinent Vitals/Pain Pain Assessment: No/denies pain    Home Living                      Prior Function            PT Goals (current goals can now be found in the care plan section) Acute Rehab PT Goals Patient Stated Goal: return home with family to assist PT Goal Formulation: With patient Time For Goal Achievement: 04/05/19 Potential to Achieve Goals: Good Progress towards PT goals: Progressing toward goals    Frequency    Min 3X/week      PT Plan Current plan remains appropriate    Co-evaluation              AM-PAC PT "6 Clicks" Mobility   Outcome Measure  Help needed turning from your back to your side while in a flat bed without using bedrails?: None Help needed moving from lying on your back to sitting on the side of a flat bed without using bedrails?: None Help needed moving to and from a bed to a chair (including a wheelchair)?: None Help needed standing up from a chair using your arms (e.g., wheelchair or bedside chair)?: None Help needed to walk in hospital room?: A Little Help needed climbing 3-5 steps with a railing? : A Little 6 Click Score: 22    End of Session   Activity Tolerance: Patient tolerated treatment well;Patient limited by fatigue Patient left: in chair;with call bell/phone within reach Nurse Communication: Mobility status PT Visit Diagnosis: Unsteadiness on feet (R26.81);Other abnormalities of gait and mobility (R26.89);Muscle weakness (generalized) (M62.81)     Time: 1962-2297 PT Time Calculation (min) (ACUTE ONLY): 34 min  Charges:  $Gait Training: 8-22 mins $Therapeutic Exercise: 8-22 mins                     12:29 PM, 03/30/19 Noah [name], MPT Physical Therapist with [facility] [phone] office [phone] mobile phone

## 2019-03-30 NOTE — Progress Notes (Signed)
PROGRESS NOTE [facility]   Noah [name]  ELM:[id number]  DOB: 19-Jan-1941  DOA: 03/28/2019 PCP: Estanislado Pandy, MD  Brief Admission Hx: 78 y.o. male, with past medical history of CAD s/p prior PCI to LAD and RCA in 1990s with most recent being DES to left circumflex in 2008, chronic combined systolic and diastolic CHF, ischemic cardiomyopathy, EF 40 to 45% by echo in June 2019, s/p biventricular ICD placement, paroxysmal atrial fibrillation, stage III CKD, hypertension, hyperlipidemia, diabetes mellitus type 2, OSA who came to the hospital with worsening shortness of breath for past 1 week.  He says that he is only been taking his morning Lasix and has been skipping the afternoon dose for the past couple of weeks.  MDM/Assessment & Plan:   1. Acute biventricular CHF exacerbation-He admits that he has only been taking his Lasix once daily but it is prescribed for twice daily.  Patient is on IV Lasix 40 mg every 12 hours and seems to be diuresing fairly well.  He has been restarted on his home heart care medications.  We are following his electrolytes closely.  We are following weights and intake and output closely.  He has diuresed 1.8L since admission.  2. Type 2 diabetes mellitus-patient had some hypoglycemia early this morning and has not been eating well.  We have reduced his Lantus and NovoLog coverage.  Metformin being [place].  Monitor CBG closely. 3. 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 4. Paroxysmal atrial fibrillation-he is on Toprol-XL for rate control and warfarin for full anticoagulation and we have asked the Pharm.D. to assist with warfarin dosing. 5. CAD status post PCI to LAD, RCA, DES to LCx-stable continue aspirin warfarin and troponins have been negative. 6. Essential hypertension-he has been resumed on his home blood pressure medications. 7. Stage IIIb CKD-following closely while diuresing with IV Lasix. 8. OSA-reordered CPAP at  bedtime-patient reports that he is not using it consistently at home.  He was encouraged to use it regularly.  DVT prophylaxis: Warfarin Code Status: Full Family Communication: Patient updated at bedside, verbalized understanding Disposition Plan: Continue inpatient treatments  Consultants:    Procedures:    Antimicrobials:     Subjective: Patient remains weak but he has been urinating a lot since admission.  He has not ambulated yet, but agreeable to PT evaluation.   Objective: Vitals:   03/29/19 1058 03/29/19 2139 03/30/19 0500 03/30/19 0630  BP: 136/69 124/60  (!) 161/66  Pulse: 71 67  69  Resp:  17  18  Temp:      TempSrc:      SpO2: 90% 97%  98%  Weight:   130.9 kg   Height:        Intake/Output Summary (Last 24 hours) at 03/30/2019 1240 Last data filed at 03/30/2019 0630 Gross per 24 hour  Intake 243 ml  Output 600 ml  Net -357 ml   Filed Weights   03/28/19 2200 03/29/19 0500 03/30/19 0500  Weight: 129 kg 129 kg 130.9 kg     REVIEW OF SYSTEMS  As per history otherwise all reviewed and reported negative  Exam:  General exam: Morbidly obese male sitting up in bed in no apparent distress he is alert and cooperative. Respiratory system: Bibasilar crackles.  No increased work of breathing. Cardiovascular system: Normal S1 & S2 heard.  2+ edema bilateral lower extremities. Gastrointestinal system: Abdomen is nondistended, soft and nontender. Normal bowel sounds heard. Central nervous system:  Alert and oriented. No focal neurological deficits. Extremities: 2+ edema bilateral lower extremities.  Data Reviewed: Basic Metabolic Panel: Recent Labs  Lab 03/28/19 1625 03/29/19 0325 03/30/19 0644  NA 144 146* 142  K 4.4 4.4 4.3  CL 104 105 102  CO2 27 28 29   GLUCOSE 127* 84 230*  BUN 46* 46* 50*  CREATININE 1.64* 1.61* 1.67*  CALCIUM 9.1 9.2 8.8*  MG  --   --  1.9   Liver Function Tests: Recent Labs  Lab 03/28/19 1625  AST 14*  ALT 10   ALKPHOS 77  BILITOT 0.3  PROT 7.1  ALBUMIN 3.3*   No results for input(s): LIPASE, AMYLASE in the last 168 hours. No results for input(s): AMMONIA in the last 168 hours. CBC: Recent Labs  Lab 03/28/19 1625 03/30/19 0644  WBC 13.1* 9.2  NEUTROABS 10.2*  --   HGB 12.0* 11.0*  HCT 40.4 37.3*  MCV 93.7 94.9  PLT 197 170   Cardiac Enzymes: No results for input(s): CKTOTAL, CKMB, CKMBINDEX, TROPONINI in the last 168 hours. CBG (last 3)  Recent Labs    03/29/19 1604 03/29/19 2136 03/30/19 1215  GLUCAP 158* 247* 180*   Recent Results (from the past 240 hour(s))  SARS CORONAVIRUS 2 (TAT 6-24 HRS) Nasopharyngeal Nasopharyngeal Swab     Status: None   Collection Time: 03/28/19  7:40 PM   Specimen: Nasopharyngeal Swab  Result Value Ref Range Status   SARS Coronavirus 2 NEGATIVE NEGATIVE Final    Comment: (NOTE) SARS-CoV-2 target nucleic acids are NOT DETECTED. The SARS-CoV-2 RNA is generally detectable in upper and lower respiratory specimens during the acute phase of infection. Negative results do not preclude SARS-CoV-2 infection, do not rule out co-infections with other pathogens, and should not be used as the sole basis for treatment or other patient management decisions. Negative results must be combined with clinical observations, patient history, and epidemiological information. The expected result is Negative. Fact Sheet for Patients: SugarRoll.be Fact Sheet for Healthcare Providers: https://www.woods-mathews.com/ This test is not yet approved or cleared by the [place] FDA and  has been authorized for detection and/or diagnosis of SARS-CoV-2 by FDA under an Emergency Use Authorization (EUA). This EUA will remain  in effect (meaning this test can be used) for the duration of the COVID-19 declaration under Section 56 4(b)(1) of the Act, 21 U.S.C. section 360bbb-3(b)(1), unless the authorization is terminated or revoked  sooner. Performed at [facility], [place] [address] [place], [place] [address]      Studies: DG Chest Port 1 View  Result Date: 03/28/2019 CLINICAL DATA:  Shortness of breath, history coronary artery disease post angioplasty, ischemic cardiomyopathy, diabetes mellitus, hypertension EXAM: PORTABLE CHEST 1 VIEW COMPARISON:  Portable exam 1655 hours compared to 02/16/2019 FINDINGS: LEFT subclavian ICD with leads projecting over RIGHT atrium, RIGHT ventricle and coronary sinus. Enlargement of cardiac silhouette with pulmonary vascular congestion. Azygos fissure noted. Mediastinal contour stable. Minimal bibasilar atelectasis. Lungs otherwise clear. No pleural effusion or pneumothorax. IMPRESSION: Enlargement of cardiac silhouette with vascular congestion post AICD. Minimal bibasilar atelectasis. Electronically Signed   By: Lavonia Dana M.D.   On: 03/28/2019 17:02   Scheduled Meds: . acetaminophen  975 mg Oral QHS  . aspirin EC  81 mg Oral Daily  . atorvastatin  40 mg Oral q1800  . famotidine  20 mg Oral QHS  . finasteride  5 mg Oral QPM  . FLUoxetine  20 mg Oral QPM  . furosemide  40  mg Intravenous Q12H  . gabapentin  100 mg Oral BID  . insulin aspart  0-9 Units Subcutaneous TID WC  . insulin glargine  30 Units Subcutaneous QHS  . isosorbide mononitrate  30 mg Oral Daily  . levETIRAcetam  750 mg Oral BID  . mouth rinse  15 mL Mouth Rinse BID  . metoprolol succinate  100 mg Oral Daily  . sacubitril-valsartan  1 tablet Oral BID  . sodium chloride flush  3 mL Intravenous Q12H  . warfarin  2.5 mg Oral Once per day on Sun Mon Wed Fri  . warfarin  5 mg Oral Once per day on Tue Thu Sat  . Warfarin - Pharmacist Dosing Inpatient   Does not apply q1800   Continuous Infusions: . 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  1. Check the care team in [facility] and look for a) attending/consulting TRH provider listed and b) the [facility] team listed 2. Log into [email or web address] and use [place]'s universal password to access. If you do not have the password, please contact the hospital operator. 3. Locate the [facility] provider you are looking for under [facility] and page to a number that you can be directly reached. 4. If you still have difficulty reaching the provider, please page the [facility] (Director on Call) for the Hospitalists listed on amion for assistance.

## 2019-03-30 NOTE — Care Management Important Message (Signed)
Important Message  Patient Details  Name: Noah [name] MRN: [id number] Date of Birth: March 19, 1941   Medicare Important Message Given:  Yes(Nancy, RN will deliver to patient due to precautions)     Tommy Medal 03/30/2019, 4:00 PM

## 2019-03-31 DIAGNOSIS — E118 Type 2 diabetes mellitus with unspecified complications: Secondary | ICD-10-CM

## 2019-03-31 LAB — CBC
HCT: 34.5 % — ABNORMAL LOW (ref 39.0–52.0)
Hemoglobin: 10.1 g/dL — ABNORMAL LOW (ref 13.0–17.0)
MCH: 27.7 pg (ref 26.0–34.0)
MCHC: 29.3 g/dL — ABNORMAL LOW (ref 30.0–36.0)
MCV: 94.5 fL (ref 80.0–100.0)
Platelets: 150 10*3/uL (ref 150–400)
RBC: 3.65 MIL/uL — ABNORMAL LOW (ref 4.22–5.81)
RDW: 13.4 % (ref 11.5–15.5)
WBC: 8.6 10*3/uL (ref 4.0–10.5)
nRBC: 0 % (ref 0.0–0.2)

## 2019-03-31 LAB — BASIC METABOLIC PANEL
Anion gap: 10 (ref 5–15)
BUN: 59 mg/dL — ABNORMAL HIGH (ref 8–23)
CO2: 29 mmol/L (ref 22–32)
Calcium: 8.8 mg/dL — ABNORMAL LOW (ref 8.9–10.3)
Chloride: 103 mmol/L (ref 98–111)
Creatinine, Ser: 1.81 mg/dL — ABNORMAL HIGH (ref 0.61–1.24)
GFR calc Af Amer: 41 mL/min — ABNORMAL LOW (ref >60)
GFR calc non Af Amer: 35 mL/min — ABNORMAL LOW (ref >60)
Glucose, Bld: 194 mg/dL — ABNORMAL HIGH (ref 70–99)
Potassium: 4.4 mmol/L (ref 3.5–5.1)
Sodium: 142 mmol/L (ref 135–145)

## 2019-03-31 LAB — MAGNESIUM: Magnesium: 1.8 mg/dL (ref 1.7–2.4)

## 2019-03-31 LAB — GLUCOSE, CAPILLARY
Glucose-Capillary: 176 mg/dL — ABNORMAL HIGH (ref 70–99)
Glucose-Capillary: 238 mg/dL — ABNORMAL HIGH (ref 70–99)

## 2019-03-31 LAB — PROTIME-INR
INR: 3.4 — ABNORMAL HIGH (ref 0.8–1.2)
Prothrombin Time: 34.6 seconds — ABNORMAL HIGH (ref 11.4–15.2)

## 2019-03-31 MED ORDER — SOLIQUA 100-33 UNT-MCG/ML ~~LOC~~ SOPN: 30.0000 [IU] | PEN_INJECTOR | Freq: Every evening | SUBCUTANEOUS | Status: DC

## 2019-03-31 MED ORDER — FUROSEMIDE 40 MG PO TABS: 40.0000 mg | ORAL_TABLET | ORAL | Status: DC

## 2019-03-31 MED ORDER — WARFARIN SODIUM 5 MG PO TABS: 2.5000 mg | ORAL_TABLET | ORAL | Status: DC

## 2019-03-31 NOTE — Progress Notes (Signed)
Home O2 delivered to room.  IV removed and discharge papers reviewed.  Wife to drive home.

## 2019-03-31 NOTE — TOC Transition Note (Signed)
Transition of Care [facility]) - CM/SW Discharge Note   Patient Details  Name: ILAN KAHRS MRN: [id number] Date of Birth: 1941/01/07  Transition of Care [facility]) CM/SW Contact:  Marshell Garfinkel, RN Phone Number: 03/31/2019, 10:52 AM   Clinical Narrative:     Discharge summary, O2 sats, and O2 order faxed to [employer].  Final next level of care: [place] Barriers to Discharge: Continued Medical Work up   Patient Goals and CMS Choice Patient states their goals for this hospitalization and ongoing recovery are:: Go home with support.   Choice offered to / list presented to : Spouse  Discharge Placement                       Discharge Plan and Services     Post Acute Care Choice: Home Health          DME Arranged: Oxygen DME Agency: [employer] Date DME Agency Contacted: 03/31/19 Time DME Agency Contacted: [phone] Representative spoke with at DME Agency: [place]: PT [place]: [place] Date [place]: 03/31/19 Time [place]: [phone] Representative spoke with at [place]: Email sent to [facility] with [place] Determinants of Health (SDOH) Interventions     Readmission Risk Interventions No flowsheet data found.

## 2019-03-31 NOTE — Discharge Summary (Signed)
Physician Discharge Summary  Noah [name] ZOX:[id number] DOB: 11/07/1940 DOA: 03/28/2019  PCP: Estanislado PandySasser, Paul W, MD Cardiology: Purvis SheffieldKoneswaran  Admit date: 03/28/2019 Discharge date: 03/31/2019  Admitted From:  Home  [facility]   Recommendations for Outpatient Follow-up:  1. Follow up with PCP in 1 weeks 2. Follow up with cardiology in 2 weeks 3. Ambulatory referral to endocrinology 4. Ambulatory referral to diabetes education 5. Please obtain BMP in 1-2 weeks  Home Health: PT   Discharge Condition: STABLE   CODE STATUS: FULL    Brief Hospitalization Summary: Please see all hospital notes, images, labs for full details of the hospitalization. HPI from admitting physician:  Noah [name]  is a 78 y.o. male, with past medical history of CAD s/p prior PCI to LAD and RCA in 1990s with most recent being DES to left circumflex in 2008, chronic combined systolic and diastolic CHF, ischemic cardiomyopathy, EF 40 to 45% by echo in June 2019, s/p biventricular ICD placement, paroxysmal atrial fibrillation, stage III CKD, hypertension, hyperlipidemia, diabetes mellitus type 2, OSA who came to the hospital with worsening shortness of breath for past 1 week.  Patient is a poor historian, unable to provide good history.  He was recently seen by cardiology on 02/22/2019 at their office.  At that time also he had worsening leg swelling, he was told to take extra dose of Lasix for 3 days. Today patient complains of dyspnea on exertion, worsening lower extremity edema. He denies chest pain Complains of coughing He denies fever or chills. Denies being in contact with Covid patients. Denies nausea vomiting or diarrhea. Denies abdominal pain or dysuria In the ED, patient received Lasix 80 mg IV x1.  BNP was elevated 345.  Troponin 15. SARS-CoV-2 test was negative.  Brief Admission Hx: 78 y.o.male,with past medical history of CAD s/p prior PCI to LAD and RCA in 1990s with most recent  being DES to left circumflex in 2008, chronic combined systolic and diastolic CHF, ischemic cardiomyopathy, EF 40 to 45% by echo in June 2019, s/p biventricular ICD placement, paroxysmal atrial fibrillation, stage III CKD, hypertension, hyperlipidemia, diabetes mellitus type 2,OSA who came to the hospital with worsening shortness of breath for past 1 week.  He says that he is only been taking his morning Lasix and has been skipping the afternoon dose for the past couple of weeks.  MDM/Assessment & Plan:   1. Acute biventricular CHF exacerbation-He admits that he has only been taking his Lasix once daily but it is prescribed for twice daily.  Patient was treated with IV Lasix 40 mg every 12 hours and diuresed well and is feeling much better.  He has been restarted on his homeheart care medications.  We are followed his electrolytes closely.  We followed weights and intake and output closely.  He has diuresed 3L since admission and weight down to 130 Kg.   2. Type 2 diabetes mellitus-patient had some hypoglycemia earlier and his does of insulin has been reduced by 50% on discharge and ambulatory referral to endocrinology was made.    3. 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 4. Paroxysmal atrial fibrillation-he is on Toprol-XL for rate control and warfarin for full anticoagulation and we have asked the Pharm.D. to assist with warfarin dosing. 5. CAD status post PCI to LAD, RCA, DES to LCx-stable continue aspirin warfarin and troponins have been negative. 6. Essential hypertension-he has been resumed on his home blood pressure medications. 7.  Stage IIIb CKD-following closely while diuresing with IV Lasix. 8. OSA-reordered CPAP at bedtime-patient reports that he is not using it consistently at home.  He was encouraged to use it regularly.  Pt refused to use CPAP in the hospital.  We have counseled him but he likely will not use it regularly at home.   DVT prophylaxis:  Warfarin Code Status: Full Family Communication: Patient updated at bedside, verbalized understanding [facility] Plan[facility]  PT is the only service that could be arranged per case management team due to his insurance.   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   CAD S/P percutaneous coronary angioplasty   Atrial fibrillation (HCC)   CKD (chronic kidney disease) stage 3, GFR 30-59 ml/min   OSA (obstructive sleep apnea)   Acute exacerbation of CHF (congestive heart failure) (HCC)   Acute on chronic combined systolic and diastolic congestive heart failure [facility](HCC)   Discharge Instructions: Discharge Instructions    Ambulatory referral to Endocrinology   Complete by: As directed    Referral to Nutrition and Diabetes Services   Complete by: As directed    Choose type of Diabetes Self-Management Training (DSMT) training services and number of hours requested: Initial DSMT: 10 hours   Check all special needs that apply to patient requiring 1 on 1 DSMT:  Low literacy Impaired mobility     DSMT Content:  Basic nutrition management Diabetes disease process Comprehensive self-management skills- All of the content areas     Choose the type of Medical Nutrition Therapy (MNT) and number of hours: Initial MNT: 3 hours   FOR MEDICARE PATIENTS: I hereby certify that I am managing this beneficiary's diabetes condition and that the above prescribed training is a necessary part of management.: Yes     Allergies as of 03/31/2019      Reactions   Codeine Other (See Comments)   constipation   Erythromycin-sulfisoxazole Other (See Comments)   Causes infection in throat and eyes      Medication List    TAKE these medications   acetaminophen 650 MG CR tablet Commonly known as:  TYLENOL Take 650-1,300 mg by mouth daily as needed for pain.   Allergy Relief 10 MG tablet Generic drug: loratadine Take 10 mg by mouth daily as needed for allergies.   aspirin EC 81 MG tablet Take 1 tablet (81 mg total) by mouth daily.   famotidine 20 MG tablet Commonly known as: PEPCID Take 20 mg by mouth at bedtime.   finasteride 5 MG tablet Commonly known as: PROSCAR Take 5 mg by mouth every evening.   Fish Oil 1000 MG Caps Take 1,000 mg by mouth 2 (two) times daily.   FLUoxetine 20 MG capsule Commonly known as: PROZAC Take 20 mg by mouth every evening.   furosemide 40 MG tablet Commonly known as: LASIX Take 1-2 tablets (40-80 mg total) by mouth See admin instructions. Take 2 tabs (80mg ) by mouth every morning & 1 tab (40mg ) every afternoon Start taking on: April 01, 2019   gabapentin 100 MG capsule Commonly known as: NEURONTIN Take 100-200 mg by mouth See admin instructions. 100mg  in the morning and 200mg  at bedtime   levETIRAcetam 750 MG tablet Commonly known as: KEPPRA Take 750 mg by mouth 2 (two) times daily.   metFORMIN 500  MG 24 hr tablet Commonly known as: GLUCOPHAGE-XR Take 1,500 mg by mouth daily.   metoprolol succinate 100 MG 24 hr tablet Commonly known as: TOPROL-XL Take 100 mg by mouth daily.   nystatin cream Commonly known as: MYCOSTATIN Apply 1 application topically daily as needed for dry skin.   omeprazole 20 MG capsule Commonly known as: PRILOSEC Take 20 mg by mouth daily.   sacubitril-valsartan 49-51 MG Commonly known as: Entresto Take 1 tablet by mouth 2 (two) times daily.   simvastatin 80 MG tablet Commonly known as: ZOCOR TAKE 80 MG (1 TABLET) DAILY. What changed:   how much to take  how to take this  when to take this  additional instructions   Soliqua 100-33 UNT-MCG/ML Sopn Generic drug: Insulin Glargine-Lixisenatide Inject 30 Units into the skin every evening. What changed: how much to take   tamsulosin 0.4 MG  Caps capsule Commonly known as: FLOMAX Take 0.8 mg by mouth every evening.   warfarin 5 MG tablet Commonly known as: COUMADIN Take as directed. If you are unsure how to take this medication, talk to your nurse or doctor. Original instructions: Take 0.5-1 tablets (2.5-5 mg total) by mouth See admin instructions. Take 1/2 tablet (2.5mg ) daily except 1 tablet (5mg ) on Tuesdays, Thursdays and Saturdays      Follow-up Information    [facility]. Schedule an appointment as soon as possible for a visit in 2 week(s).   Specialty: [facility] information: [address] [place]       Manon Hilding, MD. Schedule an appointment as soon as possible for a visit in 1 week(s).   Specialty: Family Medicine Why: Hospital Follow Up  Contact information: [place] [address] [phone]        Herminio Commons, MD. Schedule an appointment as soon as possible for a visit in 2 week(s).   Specialty: Cardiology Why: Hospital Follow Up  Contact information: [place] [address] [phone]        Evans Lance, MD .   Specialty: Cardiology Contact information: [place] [address] [phone]          Allergies  Allergen Reactions  . Codeine Other (See Comments)    constipation  . Erythromycin-Sulfisoxazole Other (See Comments)    Causes infection in throat and eyes   Allergies as of 03/31/2019      Reactions   Codeine Other (See Comments)   constipation   Erythromycin-sulfisoxazole Other (See Comments)   Causes infection in throat and eyes      Medication List    TAKE these medications   acetaminophen 650 MG CR tablet Commonly known as: TYLENOL Take 650-1,300 mg by mouth daily as needed for pain.   Allergy Relief 10 MG tablet Generic drug: loratadine Take 10 mg by mouth daily as needed for  allergies.   aspirin EC 81 MG tablet Take 1 tablet (81 mg total) by mouth daily.   famotidine 20 MG tablet Commonly known as: PEPCID Take 20 mg by mouth at bedtime.   finasteride 5 MG tablet Commonly known as: PROSCAR Take 5 mg by mouth every evening.   Fish Oil 1000 MG Caps Take 1,000 mg by mouth 2 (two) times daily.   FLUoxetine 20 MG capsule Commonly known as: PROZAC Take 20 mg by mouth every evening.   furosemide 40 MG tablet Commonly known as:  LASIX Take 1-2 tablets (40-80 mg total) by mouth See admin instructions. Take 2 tabs ( ) by mouth every morning & 1 tab ( ) every afternoon Start taking on: April 01, 2019   gabapentin 100 MG capsule Commonly known as: NEURONTIN Take 100-200 mg by mouth See admin instructions.  in the morning and  at bedtime   levETIRAcetam 750 MG tablet Commonly known as: KEPPRA Take 750 mg by mouth 2 (two) times daily.   metFORMIN 500 MG 24 hr tablet Commonly known as: GLUCOPHAGE-XR Take 1,500 mg by mouth daily.   metoprolol succinate 100 MG 24 hr tablet Commonly known as: TOPROL-XL Take 100 mg by mouth daily.   nystatin cream Commonly known as: MYCOSTATIN Apply 1 application topically daily as needed for dry skin.   omeprazole 20 MG capsule Commonly known as: PRILOSEC Take 20 mg by mouth daily.   sacubitril-valsartan 49-51 MG Commonly known as: Entresto Take 1 tablet by mouth 2 (two) times daily.   simvastatin 80 MG tablet Commonly known as: ZOCOR TAKE 80 MG (1 TABLET) DAILY. What changed:   how much to take  how to take this  when to take this  additional instructions   Soliqua 100-33 UNT-MCG/ML Sopn Generic drug: Insulin Glargine-Lixisenatide Inject 30 Units into the skin every evening. What changed: how much to take   tamsulosin 0.4 MG Caps capsule Commonly known as: FLOMAX Take 0.8 mg by mouth every evening.   warfarin 5 MG tablet Commonly known as: COUMADIN Take as directed. If you are  unsure how to take this medication, talk to your nurse or doctor. Original instructions: Take 0.5-1 tablets (2.5-5 mg total) by mouth See admin instructions. Take 1/2 tablet (2.5mg ) daily except 1 tablet ( ) on Tuesdays, Thursdays and Saturdays       Procedures/Studies: DG Chest Port 1 View  Result Date: 03/28/2019 CLINICAL DATA:  Shortness of breath, history coronary artery disease post angioplasty, ischemic cardiomyopathy, diabetes mellitus, hypertension EXAM: PORTABLE CHEST 1 VIEW COMPARISON:  Portable exam 1655 hours compared to 02/16/2019 FINDINGS: LEFT subclavian ICD with leads projecting over RIGHT atrium, RIGHT ventricle and coronary sinus. Enlargement of cardiac silhouette with pulmonary vascular congestion. Azygos fissure noted. Mediastinal contour stable. Minimal bibasilar atelectasis. Lungs otherwise clear. No pleural effusion or pneumothorax. IMPRESSION: Enlargement of cardiac silhouette with vascular congestion post AICD. Minimal bibasilar atelectasis. Electronically Signed   By: Ulyses Southward M.D.   On: 03/28/2019 17:02      Subjective: Pt says he is feeling so much better and  He is not having shortness of breath or chest pain and he has been urinating frequently with IV lasix.  He wants to go home.  He is agreeable to home health.     Discharge Exam: Vitals:   03/30/19 2127 03/31/19 0544  BP: (!) 102/48 140/69  Pulse: 73 67  Resp: 18 17  Temp: 98.1 F (36.7 C) 97.8 F (36.6 C)  SpO2: 92% 93%   Vitals:   03/30/19 1513 03/30/19 2127 03/31/19 0500 03/31/19 0544  BP: (!) 107/58 (!) 102/48  140/69  Pulse: 75 73  67  Resp: Temp: 97.9 F (36.6 C) 98.1 F (36.7 C)  97.8 F (36.6 C)  TempSrc: Oral     SpO2: 99% 92%  93%  Weight:   130.8 kg   Height:       General exam: Morbidly obese male sitting up in bed in no apparent distress he is alert and cooperative. Respiratory system: BBS clear  to auscultation.  No increased work of breathing. Cardiovascular  system: Normal S1 & S2 heard.  2+ edema bilateral lower extremities. Gastrointestinal system: Abdomen is nondistended, soft and nontender. Normal bowel sounds heard. Central nervous system: Alert and oriented. No focal neurological deficits. Extremities: trace pretibial edema bilateral lower extremities.  The results of significant diagnostics from this hospitalization (including imaging, microbiology, ancillary and laboratory) are listed below for reference.     Microbiology: Recent Results (from the past 240 hour(s))  SARS CORONAVIRUS 2 (TAT 6-24 HRS) Nasopharyngeal Nasopharyngeal Swab     Status: None   Collection Time: 03/28/19  7:40 PM   Specimen: Nasopharyngeal Swab  Result Value Ref Range Status   SARS Coronavirus 2 NEGATIVE NEGATIVE Final    Comment: (NOTE) SARS-CoV-2 target nucleic acids are NOT DETECTED. The SARS-CoV-2 RNA is generally detectable in upper and lower respiratory specimens during the acute phase of infection. Negative results do not preclude SARS-CoV-2 infection, do not rule out co-infections with other pathogens, and should not be used as the sole basis for treatment or other patient management decisions. Negative results must be combined with clinical observations, patient history, and epidemiological information. The expected result is Negative. Fact Sheet for Patients: HairSlick.no Fact Sheet for Healthcare Providers: quierodirigir.com This test is not yet approved or cleared by the [place] FDA and  has been authorized for detection and/or diagnosis of SARS-CoV-2 by FDA under an Emergency Use Authorization (EUA). This EUA will remain  in effect (meaning this test can be used) for the duration of the COVID-19 declaration under Section 56 4(b)(1) of the Act, 21 U.S.C. section 360bbb-3(b)(1), unless the authorization is terminated or revoked sooner. Performed at [facility], [address], [place], [place] [address]      Labs: BNP (last 3 results) Recent Labs    03/28/19 1625 03/29/19 0325  BNP 345.0* 286.0*   Basic Metabolic Panel: Recent Labs  Lab 03/28/19 1625 03/29/19 0325 03/30/19 0644 03/31/19 0707  NA 144 146* 142 142  K 4.4 4.4 4.3 4.4  CL 104 105 102 103  CO2 27 28 29 29   GLUCOSE 127* 84 230* 194*  BUN 46* 46* 50* 59*  CREATININE 1.64* 1.61* 1.67* 1.81*  CALCIUM 9.1 9.2 8.8* 8.8*  MG  --   --  1.9 1.8   Liver Function Tests: Recent Labs  Lab 03/28/19 1625  AST 14*  ALT 10  ALKPHOS 77  BILITOT 0.3  PROT 7.1  ALBUMIN 3.3*   No results for input(s): LIPASE, AMYLASE in the last 168 hours. No results for input(s): AMMONIA in the last 168 hours. CBC: Recent Labs  Lab 03/28/19 1625 03/30/19 0644 03/31/19 0707  WBC 13.1* 9.2 8.6  NEUTROABS 10.2*  --   --   HGB 12.0* 11.0* 10.1*  HCT 40.4 37.3* 34.5*  MCV 93.7 94.9 94.5  PLT 197 170 150   Cardiac Enzymes: No results for input(s): CKTOTAL, CKMB, CKMBINDEX, TROPONINI in the last 168 hours. BNP: Invalid input(s): POCBNP CBG: Recent Labs  Lab 03/29/19 2136 03/30/19 1215 03/30/19 1715 03/30/19 2035 03/31/19 0725  GLUCAP 247* 180* 163* 197* 176*   D-Dimer No results for input(s): DDIMER in the last 72 hours. Hgb A1c Recent Labs    03/28/19 2019  HGBA1C 8.7*   Lipid Profile No results for input(s): CHOL, HDL, LDLCALC, TRIG, CHOLHDL, LDLDIRECT in the last 72 hours. Thyroid function studies No results for input(s): TSH, T4TOTAL, T3FREE, THYROIDAB in the last 72 hours.  Invalid input(s): FREET3 Anemia work up No results for input(s): VITAMINB12, FOLATE, FERRITIN, TIBC, IRON, RETICCTPCT in the last 72 hours. Urinalysis    Component Value Date/Time   COLORURINE STRAW (A) 03/28/2019 2031   APPEARANCEUR CLEAR 03/28/2019 2031   LABSPEC 1.006 03/28/2019 2031   PHURINE 5.0 03/28/2019 2031   GLUCOSEU NEGATIVE 03/28/2019 2031   HGBUR SMALL (A) 03/28/2019 2031   BILIRUBINUR  NEGATIVE 03/28/2019 2031   KETONESUR NEGATIVE 03/28/2019 2031   PROTEINUR 30 (A) 03/28/2019 2031   NITRITE NEGATIVE 03/28/2019 2031   LEUKOCYTESUR NEGATIVE 03/28/2019 2031   Sepsis Labs Invalid input(s): PROCALCITONIN,  WBC,  LACTICIDVEN Microbiology Recent Results (from the past 240 hour(s))  SARS CORONAVIRUS 2 (TAT 6-24 HRS) Nasopharyngeal Nasopharyngeal Swab     Status: None   Collection Time: 03/28/19  7:40 PM   Specimen: Nasopharyngeal Swab  Result Value Ref Range Status   SARS Coronavirus 2 NEGATIVE NEGATIVE Final    Comment: (NOTE) SARS-CoV-2 target nucleic acids are NOT DETECTED. The SARS-CoV-2 RNA is generally detectable in upper and lower respiratory specimens during the acute phase of infection. Negative results do not preclude SARS-CoV-2 infection, do not rule out co-infections with other pathogens, and should not be used as the sole basis for treatment or other patient management decisions. Negative results must be combined with clinical observations, patient history, and epidemiological information. The expected result is Negative. Fact Sheet for Patients: HairSlick.no Fact Sheet for Healthcare Providers: quierodirigir.com This test is not yet approved or cleared by the [place] FDA and  has been authorized for detection and/or diagnosis of SARS-CoV-2 by FDA under an Emergency Use Authorization (EUA). This EUA will remain  in effect (meaning this test can be used) for the duration of the COVID-19 declaration under Section 56 4(b)(1) of the Act, 21 U.S.C. section 360bbb-3(b)(1), unless the authorization is terminated or revoked sooner. Performed at [facility], [address], [place], [place] [address]    Time coordinating discharge: 37 minutes   SIGNED:  Standley Dakins, MD  [facility] 03/31/2019, 9:00 AM How to contact the [facility] Attending or Consulting provider 7A - 7P or covering  provider during after hours 7P -7A, for this patient?  1. Check the care team in [facility] and look for a) attending/consulting TRH provider listed and b) the [facility] team listed 2. Log into [email or web address] and use [facility]'s universal password to access. If you do not have the password, please contact the hospital operator. 3. Locate the [facility] provider you are looking for under [facility] and page to a number that you can be directly reached. 4. If you still have difficulty reaching the provider, please page the [facility] (Director on Call) for the Hospitalists listed on amion for assistance.

## 2019-03-31 NOTE — TOC Transition Note (Signed)
Transition of Care [facility]) - CM/SW Discharge Note   Patient Details  Name: Noah [name] MRN: [id number] Date of Birth: May 29, 1940  [facility] CM/SW Contact:  Marshell Garfinkel, RN Phone Number: 03/31/2019, 9:06 AM   Clinical Narrative:    Email sent to Tommi Rumps with [facility] that patient is being discharged to home today. Please call RNCM [phone] if you have any questions.  Final next level of care: [place] Barriers to Discharge: Continued Medical Work up   Patient Goals and CMS Choice Patient states their goals for this hospitalization and ongoing recovery are:: Go home with support.   Choice offered to / list presented to : Spouse  Discharge Placement                       Discharge Plan and Services     Post Acute Care Choice: Home Health                    HH Arranged: PT [facility] Agency: [place] Date [facility] Agency Contacted: 03/31/19 Time [place]: [phone] Representative spoke with at [place]: Email sent to [facility] with [place] Determinants of Health (SDOH) Interventions     Readmission Risk Interventions No flowsheet data found.

## 2019-03-31 NOTE — Discharge Instructions (Signed)
Heart Failure, Diagnosis  Heart failure means that your heart is not able to pump blood in the right way. This makes it hard for your body to work well. Heart failure is usually a long-term (chronic) condition. You must take good care of yourself and follow your treatment plan from your doctor. What are the causes? This condition may be caused by:  High blood pressure.  Build up of cholesterol and fat in the arteries.  Heart attack. This injures the heart muscle.  Heart valves that do not open and close properly.  Damage of the heart muscle. This is also called cardiomyopathy.  Lung disease.  Abnormal heart rhythms. What increases the risk? The risk of heart failure goes up as a person ages. This condition is also more likely to develop in people who:  Are overweight.  Are male.  Smoke or chew tobacco.  Abuse alcohol or illegal drugs.  Have taken medicines that can damage the heart.  Have diabetes.  Have abnormal heart rhythms.  Have thyroid problems.  Have low blood counts (anemia). What are the signs or symptoms? Symptoms of this condition include:  Shortness of breath.  Coughing.  Swelling of the feet, ankles, legs, or belly.  Losing weight for no reason.  Trouble breathing.  Waking from sleep because of the need to sit up and get more air.  Rapid heartbeat.  Being very tired.  Feeling dizzy, or feeling like you may pass out (faint).  Having no desire to eat.  Feeling like you may vomit (nauseous).  Peeing (urinating) more at night.  Feeling confused. How is this treated?     This condition may be treated with:  Medicines. These can be given to treat blood pressure and to make the heart muscles stronger.  Changes in your daily life. These may include eating a healthy diet, staying at a healthy body weight, quitting tobacco and illegal drug use, or doing exercises.  Surgery. Surgery can be done to open blocked valves, or to put devices in  the heart, such as pacemakers.  A donor heart (heart transplant). You will receive a healthy heart from a donor. Follow these instructions at home:  Treat other conditions as told by your doctor. These may include high blood pressure, diabetes, thyroid disease, or abnormal heart rhythms.  Learn as much as you can about heart failure.  Get support as you need it.  Keep all follow-up visits as told by your doctor. This is important. Summary  Heart failure means that your heart is not able to pump blood in the right way.  This condition is caused by high blood pressure, heart attack, or damage of the heart muscle.  Symptoms of this condition include shortness of breath and swelling of the feet, ankles, legs, or belly. You may also feel very tired or feel like you may vomit.  You may be treated with medicines, surgery, or changes in your daily life.  Treat other health conditions as told by your doctor. This information is not intended to replace advice given to you by your health care provider. Make sure you discuss any questions you have with your health care provider. Document Released: 01/13/2008 Document Revised: 06/23/2018 Document Reviewed: 06/23/2018 Elsevier Patient Education  [place].   Heart Failure, Self Care Heart failure is a serious condition. This sheet explains things you need to do to take care of yourself at home. To help you stay as healthy as possible, you may be asked to change  your diet, take certain medicines, and make other changes in your life. Your doctor may also give you more specific instructions. If you have problems or questions, call your doctor. What are the risks? Having heart failure makes it more likely for you to have some problems. These problems can get worse if you do not take good care of yourself. Problems may include:  Blood clotting problems. This may cause a stroke.  Damage to the kidneys, liver, or lungs.  Abnormal heart  rhythms. Supplies needed:  Scale for weighing yourself.  Blood pressure monitor.  Notebook.  Medicines. How to care for yourself when you have heart failure Medicines Take over-the-counter and prescription medicines only as told by your doctor. Take your medicines every day.  Do not stop taking your medicine unless your doctor tells you to do so.  Do not skip any medicines.  Get your prescriptions refilled before you run out of medicine. This is important. Eating and drinking   Eat heart-healthy foods. Talk with a diet specialist (dietitian) to create an eating plan.  Choose foods that: ? Have no trans fat. ? Are low in saturated fat and cholesterol.  Choose healthy foods, such as: ? Fresh or frozen fruits and vegetables. ? Fish. ? Low-fat (lean) meats. ? Legumes, such as beans, peas, and lentils. ? Fat-free or low-fat dairy products. ? Whole-grain foods. ? High-fiber foods.  Limit salt (sodium) if told by your doctor. Ask your diet specialist to tell you which seasonings are healthy for your heart.  Cook in healthy ways instead of frying. Healthy ways of cooking include roasting, grilling, broiling, baking, poaching, steaming, and stir-frying.  Limit how much fluid you drink, if told by your doctor. Alcohol use  Do not drink alcohol if: ? Your doctor tells you not to drink. ? Your heart was damaged by alcohol, or you have very bad heart failure. ? You are pregnant, may be pregnant, or are planning to become pregnant.  If you drink alcohol: ? Limit how much you use to:  0-1 drink a day for women.  0-2 drinks a day for men. ? Be aware of how much alcohol is in your drink. In the [place] one drink equals one 12 oz bottle of beer (355 mL), one 5 oz glass of wine (148 mL), or one 1 oz glass of hard liquor (44 mL). Lifestyle   Do not use any products that contain nicotine or tobacco, such as cigarettes, e-cigarettes, and chewing tobacco. If you need help quitting,  ask your doctor. ? Do not use nicotine gum or patches before talking to your doctor.  Do not use illegal drugs.  Lose weight if told by your doctor.  Do physical activity if told by your doctor. Talk to your doctor before you begin an exercise if: ? You are an older adult. ? You have very bad heart failure.  Learn to manage stress. If you need help, ask your doctor.  Get rehab (rehabilitation) to help you stay independent and to help with your quality of life.  Plan time to rest when you get tired. Check weight and blood pressure   Weigh yourself every day. This will help you to know if fluid is building up in your body. ? Weigh yourself every morning after you pee (urinate) and before you eat breakfast. ? Wear the same amount of clothing each time. ? Write down your daily weight. Give your record to your doctor.  Check and write down your blood pressure  as told by your doctor.  Check your pulse as told by your doctor. Dealing with very hot and very cold weather  If it is very hot: ? Avoid activities that take a lot of energy. ? Use air conditioning or fans, or find a cooler place. ? Avoid caffeine and alcohol. ? Wear clothing that is loose-fitting, lightweight, and light-colored.  If it is very cold: ? Avoid activities that take a lot of energy. ? Layer your clothes. ? Wear mittens or gloves, a hat, and a scarf when you go outside. ? Avoid alcohol. Follow these instructions at home:  Stay up to date with shots (vaccines). Get pneumococcal and flu (influenza) shots.  Keep all follow-up visits as told by your doctor. This is important. Contact a doctor if:  You gain weight quickly.  You have increasing shortness of breath.  You cannot do your normal activities.  You get tired easily.  You cough a lot.  You don't feel like eating or feel like you may vomit (nauseous).  You become puffy (swell) in your hands, feet, ankles, or belly (abdomen).  You cannot  sleep well because it is hard to breathe.  You feel like your heart is beating fast (palpitations).  You get dizzy when you stand up. Get help right away if:  You have trouble breathing.  You or someone else notices a change in your behavior, such as having trouble staying awake.  You have chest pain or discomfort.  You pass out (faint). These symptoms may be an emergency. Do not wait to see if the symptoms will go away. Get medical help right away. Call your local emergency services (911 in the [place] Do not drive yourself to the hospital. Summary  Heart failure is a serious condition. To care for yourself, you may have to change your diet, take medicines, and make other lifestyle changes.  Take your medicines every day. Do not stop taking them unless your doctor tells you to do so.  Eat heart-healthy foods, such as fresh or frozen fruits and vegetables, fish, lean meats, legumes, fat-free or low-fat dairy products, and whole-grain or high-fiber foods.  Ask your doctor if you can drink alcohol. You may have to stop alcohol use if you have very bad heart failure.  Contact your doctor if you gain weight quickly or feel that your heart is beating too fast. Get help right away if you pass out, or have chest pain or trouble breathing. This information is not intended to replace advice given to you by your health care provider. Make sure you discuss any questions you have with your health care provider. Document Released: 07/19/2018 Document Revised: 07/18/2018 Document Reviewed: 07/19/2018 Elsevier Patient Education  [place].   Heart Failure Action Plan A heart failure action plan helps you understand what to do when you have symptoms of heart failure. Follow the plan that was created by you and your health care provider. Review your plan each time you visit your health care provider. Red zone These signs and symptoms mean you should get medical help right away:  You have  trouble breathing when resting.  You have a dry cough that is getting worse.  You have swelling or pain in your legs or abdomen that is getting worse.  You suddenly gain more than 2-3 lb (0.9-1.4 kg) in a day, or more than 5 lb (2.3 kg) in one week. This amount may be more or less depending on your condition.  You have  trouble staying awake or you feel confused.  You have chest pain.  You do not have an appetite.  You pass out. If you experience any of these symptoms:  Call your local emergency services (911 in the [place] right away or seek help at the emergency department of the nearest hospital. Yellow zone These signs and symptoms mean your condition may be getting worse and you should make some changes:  You have trouble breathing when you are active or you need to sleep with extra pillows.  You have swelling in your legs or abdomen.  You gain 2-3 lb (0.9-1.4 kg) in one day, or 5 lb (2.3 kg) in one week. This amount may be more or less depending on your condition.  You get tired easily.  You have trouble sleeping.  You have a dry cough. If you experience any of these symptoms:  Contact your health care provider within the next day.  Your health care provider may adjust your medicines. Green zone These signs mean you are doing well and can continue what you are doing:  You do not have shortness of breath.  You have very little swelling or no new swelling.  Your weight is stable (no gain or loss).  You have a normal activity level.  You do not have chest pain or any other new symptoms. Follow these instructions at home:  Take over-the-counter and prescription medicines only as told by your health care provider.  Weigh yourself daily. Your target weight is __________ lb (__________ kg). ? Call your health care provider if you gain more than __________ lb (__________ kg) in a day, or more than __________ lb (__________ kg) in one week.  Eat a heart-healthy diet.  Work with a diet and nutrition specialist (dietitian) to create an eating plan that is best for you.  Keep all follow-up visits as told by your health care provider. This is important. Where to find more information  American Heart Association: www.heart.org Summary  Follow the action plan that was created by you and your health care provider.  Get help right away if you have any symptoms in the Red zone. This information is not intended to replace advice given to you by your health care provider. Make sure you discuss any questions you have with your health care provider. Document Released: 05/15/2016 Document Revised: 03/18/2017 Document Reviewed: 05/15/2016 Elsevier Patient Education  2020 [place].   Heart Failure Exacerbation  Heart failure is a condition in which the heart does not fill up with enough blood, and therefore does not pump enough blood and oxygen to the body. When this happens, parts of the body do not get the blood and oxygen they need to function properly. This can cause symptoms such as breathing problems, fatigue, swelling, and confusion. Heart failure exacerbation refers to heart failure symptoms that get worse. The symptoms may get worse suddenly or develop slowly over time. Heart failure exacerbation is a serious medical problem that should be treated right away. What are the causes? A heart failure exacerbation can be triggered by:  Not taking your heart failure medicines correctly.  Infections.  Eating an unhealthy diet or a diet that is high in salt (sodium).  Drinking too much fluid.  Drinking alcohol.  Taking illegal drugs, such as cocaine or methamphetamine.  Not exercising. Other causes include:  Other heart conditions such as an irregular heartbeat (arrhythmia).  Anemia.  Other medical problems, such as kidney failure. Sometimes the cause of the exacerbation  is not known. What are the signs or symptoms? When heart failure symptoms  suddenly or slowly get worse, this may be a sign of heart failure exacerbation. Symptoms of heart failure include:  Breathing problems or shortness of breath.  Chronic coughing or wheezing.  Fatigue.  Nausea or lack of appetite.  Feeling light-headed.  Confusion or memory loss.  Increased heart rate or irregular heartbeat.  Buildup of fluid in the legs, ankles, feet, or abdomen.  Difficulty breathing when lying down. How is this diagnosed? This condition is diagnosed based on:  Your symptoms and medical history.  A physical exam. You may also have tests, including:  Electrocardiogram (ECG). This test measures the electrical activity of your heart.  Echocardiogram. This test uses sound waves to take a picture of your heart to see how well it works.  Blood tests.  Imaging tests, such as: ? Chest X-ray. ? MRI. ? Ultrasound.  Stress test. This test examines how well your heart functions when you exercise. Your heart is monitored while you exercise on a treadmill or exercise bike. If you cannot exercise, medicines may be used to increase your heartbeat in place of exercise.  Cardiac catheterization. During this test, a thin, flexible tube (catheter) is inserted into a blood vessel and threaded up to your heart. This test allows your health care provider to check the arteries that lead to your heart (coronary arteries).  Right heart catheterization. During this test, the pressure in your heart is measured. How is this treated? This condition may be treated by:  Adjusting your heart medicines.  Maintaining a healthy lifestyle. This includes: ? Eating a heart-healthy diet that is low in sodium. ? Not using any products that contain nicotine or tobacco, such as cigarettes and e-cigarettes. ? Regular exercise. ? Monitoring your fluid intake. ? Monitoring your weight and reporting changes to your health care provider.  Treating sleep apnea, if you have this  condition.  Surgery. This may include: ? Implanting a device that helps both sides of your heart contract at the same time (cardiac resynchronization therapy device). This can help with heart function and relieve heart failure symptoms. ? Implanting a device that can correct heart rhythm problems (implantable cardioverter defibrillator). ? Connecting a device to your heart to help it pump blood (ventricular assist device). ? Heart transplant. Follow these instructions at home: Medicines  Take over-the-counter and prescription medicines only as told by your health care provider.  Do not stop taking your medicines or change the amount you take. If you are having problems or side effects from your medicines, talk to your health care provider.  If you are having difficulty paying for your medicines, contact a social worker or your clinic. There are many programs to assist with medicine costs.  Talk to your health care provider before starting any new medicines or supplements.  Make sure your health care provider and pharmacist have a list of all the medicines you are taking. Eating and drinking   Avoid drinking alcohol.  Eat a heart-healthy diet as told by your health care provider. This includes: ? Plenty of fruits and vegetables. ? Lean proteins. ? Low-fat dairy. ? Whole grains. ? Foods that are low in sodium. Activity   Exercise regularly as told by your health care provider. Balance exercise with rest.  Ask your health care provider what activities are safe for you. This includes sexual activity, exercise, and daily tasks at home or work. Lifestyle  Do not use any products  that contain nicotine or tobacco, such as cigarettes and e-cigarettes. If you need help quitting, ask your health care provider.  Maintain a healthy weight. Ask your health care provider what weight is healthy for you.  Consider joining a patient support group. This can help with emotional problems you may  have, such as stress and anxiety. General instructions  Talk to your health care provider about flu and pneumonia vaccines.  Keep a list of medicines that you are taking. This may help in emergency situations.  Keep all follow-up visits as told by your health care provider. This is important. Contact a health care provider if:  You have questions about your medicines or you miss a dose.  You feel anxious, depressed, or stressed.  You have swelling in your feet, ankles, legs, or abdomen.  You have shortness of breath during activity or exercise.  You have a cough.  You have a fever.  You have trouble sleeping.  You gain 2-3 lb (1-1.4 kg) in 24 hours or 5 lb (2.3 kg) in a week. Get help right away if:  You have chest pain.  You have shortness of breath while resting.  You have severe fatigue.  You are confused.  You have severe dizziness.  You have a rapid or irregular heartbeat.  You have nausea or you vomit.  You have a cough that is worse at night or you cannot lie flat.  You have a cough that will not go away.  You have severe depression or sadness. Summary  When heart failure symptoms get worse, it is called heart failure exacerbation.  Common causes of this condition include taking medicines incorrectly, infections, and drinking alcohol.  This condition may be treated by adjusting medicines, maintaining a healthy lifestyle, or surgery.  Do not stop taking your medicines or change the amount you take. If you are having problems or side effects from your medicines, talk to your health care provider. This information is not intended to replace advice given to you by your health care provider. Make sure you discuss any questions you have with your health care provider. Document Released: 08/17/2016 Document Revised: 03/18/2017 Document Reviewed: 08/17/2016 Elsevier Patient Education  2020 [place].   Heart Failure Medicines  Heart failure is a  condition in which the heart cannot pump enough blood through the body. This can cause symptoms such as shortness of breath, fatigue, and confusion. There are two types of heart failure:  Heart failure with reduced ejection fraction. In this type, the heart muscle is weak.  Heart failure with preserved ejection fraction. In this type, the heart muscle does not fill with blood properly and may be stiff. There is no cure for heart failure. However, being treated with medicines and following your health care provider's instructions about a healthy lifestyle can help you stay active, avoid problems, and live longer. Talk to your health care provider about all medicines that you are taking, how often you should take them, and what possible problems (side effects) they may cause. Talk with your health care provider if you have difficulty affording your medicines. What are some common medicines for heart failure? The medicines that are prescribed for you will depend on your symptoms, the type of heart failure you have, and the cause of your heart failure. In some cases, you may need to take more than one medicine. You will be prescribed the following medicines according to your type of heart failure: Heart failure with reduced ejection fraction  Beta-blockers.  Angiotensin-converting enzyme (ACE) inhibitors.  Angiotensin II receptor blockers (ARBs).  Angiotensin receptor neprilysin inhibitors (ARNIs).  Aldosterone antagonists.  Diuretics.  Digoxin.  Nitrates. Heart failure with preserved ejection fraction  Medicines to control blood pressure, including: ? Beta-blockers. ? Angiotensin-converting enzyme (ACE) inhibitors. ? Angiotensin II receptor blockers (ARBs).  Diuretics.  Aldosterone antagonists. What should I know about beta-blockers?  These medicines lower your blood pressure and slow your heart rate. This helps to lessen your heart's workload.  They can help to relieve chest  pain (angina).  They can help to improve your heart's ability to pump.  They may cause asthma attacks and shortness of breath.  Because these medicines slow your heart rate, it is important not to overwork yourself while exercising. Talk to your health care provider about what your target heart rate should be while you exercise.  These medicines can hide the symptoms of low blood sugar (glucose), which is also called hypoglycemia. If you have diabetes, make sure to check your blood glucose carefully. If you have hypoglycemia, talk to your health care provider about adjusting your medicines.  Beta-blockers may make you feel dizzy or light-headed at first. Do not drive or use heavy machinery when you first start these medicines. Ask your health care provider when it is safe for you to drive. What should I know about ACE inhibitors or ARBs?  These medicines help to widen arteries and veins. This action lowers your blood pressure and lessens the strain on your heart, making it easier for your heart to pump.  They can help to lessen the symptoms of heart failure.  ARBs are often used if a person cannot take ACE inhibitors.  ACE inhibitors may cause a dry cough.  In rare cases, ACE inhibitors and ARBs can cause swelling of the tongue or lips, other swelling, taste problems, and skin rashes. If these symptoms occur, stop taking the medicines and contact your health care provider.  Do not take ACE inhibitors if you are pregnant or may become pregnant. These medicines can cause health problems in an unborn baby.  These medicines may cause dizziness. You may need regular checkups and blood tests to monitor how they are working. What should I know about ARNIs?  These medicines are a combination of an ARB and another medicine. They lower your blood pressure.  Side effects may include dry cough, dizziness, low blood pressure, and kidney problems.  Do not take ARNIs if you are already taking ACE  inhibitors or ARBs.  You may notice increased urination when taking these medicines.  ARNIs can raise the amount of potassium in the blood. Your potassium levels will be monitored regularly by your health care provider. What should I know about aldosterone antagonists?  They help the body to remove excess sodium through urination. This helps to lessen the amount of blood that the heart needs to pump.  They can also help to lower blood pressure and improve the heart's ability to pump blood.  They may cause dizziness, diarrhea, coughing, or flu-like symptoms.  They should not be used if you have type 2 diabetes.  They can raise the amount of potassium in the blood. Your potassium levels will be monitored regularly by your health care provider.  These medicines can make men's breasts large and tender. What should I know about diuretics?  Diuretics are medicines that help the body get rid of excess fluid through urination. They can also help lessen your heart's workload.  They help  to lessen fluid buildup in the lungs, ankles, and feet.  They help to lower your blood pressure.  They can worsen problems with controlling urination (urinary incontinence).  They may cause dizziness, headaches, muscle cramps, and an upset stomach.  They can cause weak muscles, dry mouth, or confusion. It is important to drink plenty of fluids while taking these medicines, especially while exercising or on hot days. What should I know about digoxin?  Digoxin helps the heart pump more blood efficiently. It also lowers your heart rate.  It can help ease heart failure symptoms and may be used if other medicines do not work.  It can also help with irregular heartbeat (arrhythmia).  It may cause stomach problems, fatigue, headache, drowsiness, or vision problems. What should I know about nitrates?  Nitrates relax the blood vessels and increase oxygen and blood supply to the heart. They also lower the blood  pressure.  They are usually taken to lessen chest pain.  They may cause headaches, flushing, or irregular heartbeat. Summary  A healthy lifestyle and treatment with medicine will relieve symptoms of heart failure.  In some cases, you may need to take more than one medicine.  It is important to talk to your health care provider about how often you should take your medicines. Do not skip a dose or change your dosage.  Talk to your health care provider about possible side effects of these medicines. This information is not intended to replace advice given to you by your health care provider. Make sure you discuss any questions you have with your health care provider. Document Released: 08/20/2016 Document Revised: 04/20/2017 Document Reviewed: 08/20/2016 Elsevier Patient Education  2020 [place] With Heart Failure  Heart failure is a long-term (chronic) condition in which the heart cannot pump enough blood through the body. When this happens, parts of the body do not get the blood and oxygen they need. There is no cure for heart failure at this time, so it is important for you to take good care of yourself and follow the treatment plan set by your health care provider. If you are living with heart failure, there are ways to help you manage the disease. Follow these instructions at home: Living with heart failure requires you to make changes in your life. Your health care team will teach you about the changes you need to make in order to relieve your symptoms and lower your risk of going to the hospital. Follow the treatment plan as set by your health care provider. Medicines Medicines are important in reducing your heart's workload, slowing the progression of heart failure, and improving your symptoms.  Take over-the-counter and prescription medicines only as told by your health care provider.  Do not stop taking your medicine unless your health care provider tells you to do  that.  Do not skip any dose of your medicine.  Refill prescriptions before you run out of medicine. You need your medicines every day. Eating and drinking   Eat heart-healthy foods. Talk with a dietitian to make an eating plan that is right for you. ? If directed by your health care provider: ? Limit salt (sodium). Lowering your sodium intake may reduce symptoms of heart failure. Ask a dietitian to recommend heart-healthy seasonings. ? Limit your fluid intake. Fluid restriction may reduce symptoms of heart failure. ? Use low-fat cooking methods instead of frying. Low-fat methods include roasting, grilling, broiling, baking, poaching, steaming, and stir-frying. ? Choose foods that contain  no trans fat and are low in saturated fat and cholesterol. Healthy choices include fresh or frozen fruits and vegetables, fish, lean meats, legumes, fat-free or low-fat dairy products, and whole-grain or high-fiber foods.  Limit alcohol intake to no more than 1 drink a day for nonpregnant women and 2 drinks a day for men. One drink equals 12 oz of beer, 5 oz of wine, or 1 oz of hard liquor. ? Drinking more than that is harmful to your heart. Tell your health care provider if you drink alcohol several times a week. ? Talk with your health care provider about whether any level of alcohol use is safe for you. Activity   Ask your health care provider about attending cardiac rehabilitation. These programs include aerobic physical activity, which provides many benefits for your heart.  If no cardiac rehabilitation program is available, ask your health care provider what aerobic exercises are safe for you to do. Lifestyle Make the lifestyle changes recommended by your health care provider. In general:  Lose weight if your health care provider tells you to do that. Weight loss may reduce symptoms of heart failure.  Do not use any products that contain nicotine or tobacco, such as cigarettes or e-cigarettes. If  you need help quitting, ask your health care provider.  Do not use street (illegal) drugs.  Return to your normal activities as told by your health care provider. Ask your health care provider what activities are safe for you. General instructions   Make sure you weigh yourself every day to track your weight. Rapid weight gain may indicate an increase in fluid in your body and may increase the workload of your heart. ? Weigh yourself every morning. Do this after you urinate but before you eat breakfast. ? Wear the same type of clothing, without shoes, each time you weigh yourself. ? Weigh yourself on the same scale and in the same spot each time.  Living with chronic heart failure often leads to emotions such as fear, stress, anxiety, and depression. If you feel any of these emotions and need help coping, contact your health care provider. Other ways to get help include: ? Talking to friends and family members about your condition. They can give you support and guidance. Explain your symptoms to them and, if comfortable, invite them to attend appointments or rehabilitation with you. ? Joining a support group for people with chronic heart failure. Talking with other people who have the same symptoms may give you new ways of coping with your disease and your emotions.  Stay up to date with your shots (vaccines). Staying current on pneumococcal and influenza vaccines is especially important in preventing germs from attacking your airways (respiratory infections).  Keep all follow-up visits as told by your health care provider. This is important. How to recognize changes in your condition You and your family members need to know what changes to watch for in your condition. Watch for the following changes and report them to your health care provider:  Sudden weight gain. Ask your health care provider what amount of weight gain to report.  Shortness of breath: ? Feeling short of breath while at  rest, with no exercise or activity that required great effort. ? Feeling breathless with activity.  Swelling of your lower legs or ankles.  Difficulty sleeping: ? You wake up feeling short of breath. ? You have to use more pillows to raise your head in order to sleep.  Frequent, dry, hacking cough.  Loss  of appetite.  Feeling more tired all the time.  Depression or feelings of sadness or hopelessness.  Bloating in the stomach. Where to find more information  Local support groups. Ask your health care provider about groups near you.  The American Heart Association: www.heart.org Contact a health care provider if:  You have a rapid weight gain.  You have increasing shortness of breath that is unusual for you.  You are unable to participate in your usual physical activities.  You tire easily.  You cough more than normal, especially with physical activity.  You have any swelling or more swelling in areas such as your hands, feet, ankles, or abdomen.  You feel like your heart is beating quickly (palpitations).  You become dizzy or light-headed when you stand up. Get help right away if:  You have difficulty breathing.  You notice or your family notices a change in your awareness, such as having trouble staying awake or having difficulty with concentration.  You have pain or discomfort in your chest.  You have an episode of fainting (syncope). Summary  There is no cure for heart failure, so it is important for you to take good care of yourself and follow the treatment plan set by your health care provider.  Medicines are important in reducing your heart's workload, slowing the progression of heart failure, and improving your symptoms.  Living with chronic heart failure often leads to emotions such as fear, stress, anxiety, and depression. If you are feeling any of these emotions and need help coping, contact your health care provider. This information is not intended  to replace advice given to you by your health care provider. Make sure you discuss any questions you have with your health care provider. Document Released: 08/18/2016 Document Revised: 03/18/2017 Document Reviewed: 08/18/2016 Elsevier Patient Education  2020 [place].   Preventing Diabetes Mellitus Complications You can take action to prevent or slow down problems that are caused by diabetes (diabetes mellitus). Following your diabetes plan and taking care of yourself can reduce your risk of serious or life-threatening complications. What actions can I take to prevent diabetes complications? Manage your diabetes   Follow instructions from your health care providers about managing your diabetes. Your diabetes may be managed by a team of health care providers who can teach you how to care for yourself and can answer questions that you have.  Educate yourself about your condition so you can make healthy choices about eating and physical activity.  Check your blood sugar (glucose) levels as often as directed. Your health care provider will help you decide how often to check your blood glucose level depending on your treatment goals and how well you are meeting them.  Ask your health care provider if you should take low-dose aspirin daily and what dose is recommended for you. Taking low-dose aspirin daily is recommended to help prevent cardiovascular disease. Do not use nicotine or tobacco Do not use any products that contain nicotine or tobacco, such as cigarettes and e-cigarettes. If you need help quitting, ask your health care provider. Nicotine raises your risk for diabetes problems. If you quit using nicotine:  You will lower your risk for heart attack, stroke, nerve disease, and kidney disease.  Your cholesterol and blood pressure may improve.  Your blood circulation will improve. Keep your blood pressure under control Your personal target blood pressure is determined based  on:  Your age.  Your medicines.  How long you have had diabetes.  Any other  medical conditions you have. To control your blood pressure:  Follow instructions from your health care provider about meal planning, exercise, and medicines.  Make sure your health care provider checks your blood pressure at every medical visit.  Monitor your blood pressure at home as told by your health care provider.  Keep your cholesterol under control To control your cholesterol:  Follow instructions from your health care provider about meal planning, exercise, and medicines.  Have your cholesterol checked at least once a year.  You may be prescribed medicine to lower cholesterol (statin). If you are not taking a statin, ask your health care provider if you should be. Controlling your cholesterol may:  Help prevent heart disease and stroke. These are the most common health problems for people with diabetes.  Improve your blood flow. Schedule and keep yearly physical exams and eye exams Your health care provider will tell you how often you need medical visits depending on your diabetes management plan. Keep all follow-up visits as directed. This is important so possible problems can be identified early and complications can be avoided or treated.  Every visit with your health care provider should include measuring your: ? Weight. ? Blood pressure. ? Blood glucose control.  Your A1c (hemoglobin A1c) level should be checked: ? At least 2 times a year, if you are meeting your treatment goals. ? 4 times a year, if you are not meeting treatment goals or if your treatment goals have changed.  Your blood lipids (lipid profile) should be checked yearly. You should also be checked yearly for protein in your urine (urine microalbumin).  If you have type 1 diabetes, get an eye exam 3-5 years after you are diagnosed, and then once a year after your first exam.  If you have type 2 diabetes, get an eye  exam as soon as you are diagnosed, and then once a year after your first exam. Keep your vaccines current It is recommended that you receive:  A flu (influenza) vaccine every year.  A pneumonia (pneumococcal) vaccine and a hepatitis B vaccine. If you are age 101 or older, you may get the pneumonia vaccine as a series of two separate shots. Ask your health care provider which other vaccines may be recommended. Take care of your feet Diabetes may cause you to have poor blood circulation to your legs and feet. Because of this, taking care of your feet is very important. Diabetes can cause:  The skin on the feet to get thinner, break more easily, and heal more slowly.  Nerve damage in your legs and feet, which results in decreased feeling. You may not notice minor injuries that could lead to serious problems. To avoid foot problems:  Check your skin and feet every day for cuts, bruises, redness, blisters, or sores.  Schedule a foot exam with your health care provider once every year. This exam includes: ? Inspecting of the structure and skin of your feet. ? Checking the pulses and sensation in your feet.  Make sure that your health care provider performs a visual foot exam at every medical visit.  Take care of your teeth People with poorly controlled diabetes are more likely to have gum (periodontal) disease. Diabetes can make periodontal diseases harder to control. If not treated, periodontal diseases can lead to tooth loss. To prevent this:  Brush your teeth twice a day.  Floss at least once a day.  Visit your dentist 2 times a year. Drink responsibly Limit alcohol intake to  no more than 1 drink a day for nonpregnant women and 2 drinks a day for men. One drink equals 12 oz of beer, 5 oz of wine, or 1 oz of hard liquor.  It is important to eat food when you drink alcohol to avoid low blood glucose (hypoglycemia). Avoid alcohol if you:  Have a history of alcohol abuse or  dependence.  Are pregnant.  Have liver disease, pancreatitis, advanced neuropathy, or severe hypertriglyceridemia. Lessen stress Living with diabetes can be stressful. When you are experiencing stress, your blood glucose may be affected in two ways:  Stress hormones may cause your blood glucose to rise.  You may be distracted from taking good care of yourself. Be aware of your stress level and make changes to help you manage challenging situations. To lower your stress levels:  Consider joining a support group.  Do planned relaxation or meditation.  Do a hobby that you enjoy.  Maintain healthy relationships.  Exercise regularly.  Work with your health care provider or a mental health professional. Summary  You can take action to prevent or slow down problems that are caused by diabetes (diabetes mellitus). Following your diabetes plan and taking care of yourself can reduce your risk of serious or life-threatening complications.  Follow instructions from your health care providers about managing your diabetes. Your diabetes may be managed by a team of health care providers who can teach you how to care for yourself and can answer questions that you have.  Your health care provider will tell you how often you need medical visits depending on your diabetes management plan. Keep all follow-up visits as directed. This is important so possible problems can be identified early and complications can be avoided or treated. This information is not intended to replace advice given to you by your health care provider. Make sure you discuss any questions you have with your health care provider. Document Released: 12/22/2010 Document Revised: 07/04/2017 Document Reviewed: 01/03/2016 Elsevier Patient Education  [place].   Type 2 Diabetes Mellitus, Self Care, Adult When you have type 2 diabetes (type 2 diabetes mellitus), you must make sure your blood sugar (glucose) stays in a  healthy range. You can do this with:  Nutrition.  Exercise.  Lifestyle changes.  Medicines or insulin, if needed.  Support from your doctors and others. How to stay aware of blood sugar   Check your blood sugar level every day, as often as told.  Have your A1c (hemoglobin A1c) level checked two or more times a year. Have it checked more often if your doctor tells you to. Your doctor will set personal treatment goals for you. Generally, you should have these blood sugar levels:  Before meals (preprandial): 80-130 mg/dL (4.4-7.2 mmol/L).  After meals (postprandial): below 180 mg/dL (10 mmol/L).  A1c level: less than 7%. How to manage high and low blood sugar Signs of high blood sugar High blood sugar is called hyperglycemia. Know the signs of high blood sugar. Signs may include:  Feeling: ? Thirsty. ? Hungry. ? Very tired.  Needing to pee (urinate) more than usual.  Blurry vision. Signs of low blood sugar Low blood sugar is called hypoglycemia. This is when blood sugar is at or below 70 mg/dL (3.9 mmol/L). Signs may include:  Feeling: ? Hungry. ? Worried or nervous (anxious). ? Sweaty and clammy. ? Confused. ? Dizzy. ? Sleepy. ? Sick to your stomach (nauseous).  Having: ? A fast heartbeat. ? A headache. ? A change  in your vision. ? Jerky movements that you cannot control (seizure). ? Tingling or no feeling (numbness) around your mouth, lips, or tongue.  Having trouble with: ? Moving (coordination). ? Sleeping. ? Passing out (fainting). ? Getting upset easily (irritability). Treating low blood sugar To treat low blood sugar, eat or drink something sugary right away. If you can think clearly and swallow safely, follow the 15:15 rule:  Take 15 grams of a fast-acting carb (carbohydrate). Talk with your doctor about how much you should take.  Some fast-acting carbs are: ? Sugar tablets (glucose pills). Take 3-4 pills. ? 6-8 pieces of hard candy. ? 4-6 oz  (120-150 mL) of fruit juice. ? 4-6 oz (120-150 mL) of regular (not diet) soda. ? 1 Tbsp (15 mL) honey or sugar.  Check your blood sugar 15 minutes after you take the carb.  If your blood sugar is still at or below 70 mg/dL (3.9 mmol/L), take 15 grams of a carb again.  If your blood sugar does not go above 70 mg/dL (3.9 mmol/L) after 3 tries, get help right away.  After your blood sugar goes back to normal, eat a meal or a snack within 1 hour. Treating very low blood sugar If your blood sugar is at or below 54 mg/dL (3 mmol/L), you have very low blood sugar (severe hypoglycemia). This is an emergency. Do not wait to see if the symptoms will go away. Get medical help right away. Call your local emergency services (911 in the [place] If you have very low blood sugar and you cannot eat or drink, you may need a glucagon shot (injection). A family member or friend should learn how to check your blood sugar and how to give you a glucagon shot. Ask your doctor if you need to have a glucagon shot kit at home. Follow these instructions at home: Medicine  Take insulin and diabetes medicines as told.  If your doctor says you should take more or less insulin and medicines, do this exactly as told.  Do not run out of insulin or medicines. Having diabetes can raise your risk for other long-term conditions. These include heart disease and kidney disease. Your doctor may prescribe medicines to help you not have these problems. Food   Make healthy food choices. These include: ? Chicken, fish, egg whites, and beans. ? Oats, whole wheat, bulgur, brown rice, quinoa, and millet. ? Fresh fruits and vegetables. ? Low-fat dairy products. ? Nuts, avocado, olive oil, and canola oil.  Meet with a food specialist (dietitian). He or she can help you make an eating plan that is right for you.  Follow instructions from your doctor about what you cannot eat or drink.  Drink enough fluid to keep your pee (urine)  pale yellow.  Keep track of carbs that you eat. Do this by reading food labels and learning food serving sizes.  Follow your sick day plan when you cannot eat or drink normally. Make this plan with your doctor so it is ready to use. Activity  Exercise 3 or more times a week.  Do not go more than 2 days without exercising.  Talk with your doctor before you start a new exercise. Your doctor may need to tell you to change: ? How much insulin or medicines you take. ? How much food you eat. Lifestyle  Do not use any tobacco products. These include cigarettes, chewing tobacco, and e-cigarettes. If you need help quitting, ask your doctor.  Ask your doctor how  much alcohol is safe for you.  Learn to deal with stress. If you need help with this, ask your doctor. Body care   Stay up to date with your shots (immunizations).  Have your eyes and feet checked by a doctor as often as told.  Check your skin and feet every day. Check for cuts, bruises, redness, blisters, or sores.  Brush your teeth and gums two times a day. Floss one or more times a day.  Go to the dentist one or more times every 6 months.  Stay at a healthy weight. General instructions  Take over-the-counter and prescription medicines only as told by your doctor.  Share your diabetes care plan with: ? Your work or school. ? People you live with.  Carry a card or wear jewelry that says you have diabetes.  Keep all follow-up visits as told by your doctor. This is important. Questions to ask your doctor  Do I need to meet with a diabetes educator?  Where can I find a support group for people with diabetes? Where to find more information To learn more about diabetes, visit:  American Diabetes Association: www.diabetes.org  American Association of Diabetes Educators: www.diabeteseducator.org Summary  When you have type 2 diabetes, you must make sure your blood sugar (glucose) stays in a healthy range.  Check  your blood sugar every day, as often as told.  Having diabetes can raise your risk for other conditions. Your doctor may prescribe medicines to help you not have these problems.  Keep all follow-up visits as told by your doctor. This is important. This information is not intended to replace advice given to you by your health care provider. Make sure you discuss any questions you have with your health care provider. Document Released: 07/28/2015 Document Revised: 09/26/2017 Document Reviewed: 05/09/2015 Elsevier Patient Education  2020 [place].   IMPORTANT INFORMATION: PAY CLOSE ATTENTION   PHYSICIAN DISCHARGE INSTRUCTIONS  Follow with Primary care provider  Sasser, Silvestre Moment, MD  and other consultants as instructed by your Hospitalist Physician  [place], WORSEN OR NEW PROBLEM DEVELOPS   Please note: You were cared for by a hospitalist during your hospital stay. Every effort will be made to forward records to your primary care provider.  You can request that your primary care provider send for your hospital records if they have not received them.  Once you are discharged, your primary care physician will handle any further medical issues. Please note that NO REFILLS for any discharge medications will be authorized once you are discharged, as it is imperative that you return to your primary care physician (or establish a relationship with a primary care physician if you do not have one) for your post hospital discharge needs so that they can reassess your need for medications and monitor your lab values.  Please get a complete blood count and chemistry panel checked by your Primary MD at your next visit, and again as instructed by your Primary MD.  Get Medicines reviewed and adjusted: Please take all your medications with you for your next visit with your Primary MD  Laboratory/radiological data: Please request your Primary MD to  go over all hospital tests and procedure/radiological results at the follow up, please ask your primary care provider to get all Hospital records sent to his/her office.  In some cases, they will be blood work, cultures and biopsy results pending at the time of your discharge. Please  request that your primary care provider follow up on these results.  If you are diabetic, please bring your blood sugar readings with you to your follow up appointment with primary care.    Please call and make your follow up appointments as soon as possible.    Also Note the following: If you experience worsening of your admission symptoms, develop shortness of breath, life threatening emergency, suicidal or homicidal thoughts you must seek medical attention immediately by calling 911 or calling your MD immediately  if symptoms less severe.  You must read complete instructions/literature along with all the possible adverse reactions/side effects for all the Medicines you take and that have been prescribed to you. Take any new Medicines after you have completely understood and accpet all the possible adverse reactions/side effects.   Do not drive when taking Pain medications or sleeping medications (Benzodiazepines)  Do not take more than prescribed Pain, Sleep and Anxiety Medications. It is not advisable to combine anxiety,sleep and pain medications without talking with your primary care practitioner  Special Instructions: If you have smoked or chewed Tobacco  in the last 2 yrs please stop smoking, stop any regular Alcohol  and or any Recreational drug use.  Wear Seat belts while driving.  Do not drive if taking any narcotic, mind altering or controlled substances or recreational drugs or alcohol.

## 2019-03-31 NOTE — Progress Notes (Signed)
[facility] for Coumadin Indication: atrial fibrillation  Allergies  Allergen Reactions  . Codeine Other (See Comments)    constipation  . Erythromycin-Sulfisoxazole Other (See Comments)    Causes infection in throat and eyes    Patient Measurements: Height: 5\' 8"  (172.7 cm) Weight: 288 lb 5.8 oz (130.8 kg) IBW/kg (Calculated) : 68.4  Vital Signs: Temp: 97.8 F (36.6 C) (12/12 0544) BP: 140/69 (12/12 0544) Pulse Rate: 67 (12/12 0544)  Labs: Recent Labs    03/28/19 1625 03/28/19 2019 03/29/19 0040 03/29/19 0325 03/30/19 0644 03/31/19 0707  HGB 12.0*  --   --   --  11.0* 10.1*  HCT 40.4  --   --   --  37.3* 34.5*  PLT 197  --   --   --  170 150  LABPROT 27.8*  --   --  28.2* 33.3* 34.6*  INR 2.6*  --   --  2.7* 3.3* 3.4*  CREATININE 1.64*  --   --  1.61* 1.67* 1.81*  TROPONINIHS 15 17 18* 17  --   --     Estimated Creatinine Clearance: 44.4 mL/min (A) (by C-G formula based on SCr of 1.81 mg/dL (H)).   Medical History: Past Medical History:  Diagnosis Date  . CAD S/P percutaneous coronary angioplasty    remote PCIs in 1990's- last CFX PCI 2008  . Chronic kidney disease, stage III (moderate)   . Diabetes mellitus with complication (HCC)    with hyperglycemia and diabetic autonomic poly neuropathy  . Gastro-esophageal reflux disease with esophagitis   . Hyperlipidemia   . Hypertension   . Ischemic cardiomyopathy 01/20/2018   St Jude ICD   . Morbid obesity ([employer])   . Obstructive sleep apnea    on CPAP  . Polyneuropathy   . Primary insomnia     Medications:  See electronic med rec  Assessment: 78 y.o. M presents with SOB - acute exacerbation of CHF. Pt on coumadin PTA for afib. Home dose: 2.5mg  daily except for 5mg  on Tues/Thur/Sat  INR remains elevated at 3.4  Goal of Therapy:  INR 2-3 Monitor platelets by anticoagulation protocol: Yes   Plan: No coumadin today.  Monitor daily INR and s/s of bleeding  Isac Sarna, BS [place], [place] Clinical Pharmacist Pager [phone] 03/31/2019 9:12 AM

## 2019-03-31 NOTE — Progress Notes (Signed)
SATURATION QUALIFICATIONS: (This note is used to comply with regulatory documentation for home oxygen)  Patient Saturations on Room Air at Rest = 92%  Patient Saturations on Room Air while Ambulating = 85%  Patient Saturations on 3 Liters of oxygen while Ambulating = 94%  Please briefly explain why patient needs home oxygen:  [place]ats while ambulating

## 2019-04-03 ENCOUNTER — Other Ambulatory Visit: Payer: Self-pay

## 2019-04-03 ENCOUNTER — Other Ambulatory Visit: Payer: Self-pay | Admitting: Cardiovascular Disease

## 2019-04-03 ENCOUNTER — Encounter: Payer: Self-pay | Admitting: Internal Medicine

## 2019-04-03 ENCOUNTER — Ambulatory Visit (INDEPENDENT_AMBULATORY_CARE_PROVIDER_SITE_OTHER): Payer: Medicare HMO | Admitting: Internal Medicine

## 2019-04-03 VITALS — BP 140/78 | Temp 96.8°F | Ht 68.0 in | Wt 295.0 lb

## 2019-04-03 DIAGNOSIS — I5042 Chronic combined systolic (congestive) and diastolic (congestive) heart failure: Secondary | ICD-10-CM | POA: Diagnosis not present

## 2019-04-03 DIAGNOSIS — G4733 Obstructive sleep apnea (adult) (pediatric): Secondary | ICD-10-CM | POA: Diagnosis not present

## 2019-04-03 DIAGNOSIS — E1122 Type 2 diabetes mellitus with diabetic chronic kidney disease: Secondary | ICD-10-CM | POA: Diagnosis not present

## 2019-04-03 DIAGNOSIS — I130 Hypertensive heart and chronic kidney disease with heart failure and stage 1 through stage 4 chronic kidney disease, or unspecified chronic kidney disease: Secondary | ICD-10-CM | POA: Diagnosis not present

## 2019-04-03 DIAGNOSIS — I255 Ischemic cardiomyopathy: Secondary | ICD-10-CM | POA: Diagnosis not present

## 2019-04-03 DIAGNOSIS — N1832 Chronic kidney disease, stage 3b: Secondary | ICD-10-CM | POA: Diagnosis not present

## 2019-04-03 DIAGNOSIS — I4891 Unspecified atrial fibrillation: Secondary | ICD-10-CM

## 2019-04-03 DIAGNOSIS — I2510 Atherosclerotic heart disease of native coronary artery without angina pectoris: Secondary | ICD-10-CM | POA: Diagnosis not present

## 2019-04-03 DIAGNOSIS — I5043 Acute on chronic combined systolic (congestive) and diastolic (congestive) heart failure: Secondary | ICD-10-CM | POA: Diagnosis not present

## 2019-04-03 DIAGNOSIS — I480 Paroxysmal atrial fibrillation: Secondary | ICD-10-CM | POA: Diagnosis not present

## 2019-04-03 DIAGNOSIS — I5082 Biventricular heart failure: Secondary | ICD-10-CM | POA: Diagnosis not present

## 2019-04-03 DIAGNOSIS — G40909 Epilepsy, unspecified, not intractable, without status epilepticus: Secondary | ICD-10-CM | POA: Diagnosis not present

## 2019-04-03 NOTE — Patient Instructions (Signed)
Medication Instructions:  Your physician recommends that you continue on your current medications as directed. Please refer to the Current Medication list given to you today.  *If you need a refill on your cardiac medications before your next appointment, please call your pharmacy*  Lab Work: NONE  If you have labs (blood work) drawn today and your tests are completely normal, you will receive your results only by: . MyChart Message (if you have MyChart) OR . A paper copy in the mail If you have any lab test that is abnormal or we need to change your treatment, we will call you to review the results.  Testing/Procedures: NONE   Follow-Up: At [facility], you and your health needs are our priority.  As part of our continuing mission to provide you with exceptional heart care, we have created designated Provider Care Teams.  These Care Teams include your primary Cardiologist (physician) and Advanced Practice Providers (APPs -  Physician Assistants and Nurse Practitioners) who all work together to provide you with the care you need, when you need it.  Your next appointment:   1 year(s)  The format for your next appointment:   In Person  Provider:   Gregg Taylor, MD  Other Instructions Thank you for choosing  [facility]!    

## 2019-04-03 NOTE — Progress Notes (Signed)
HPI [name] returns today for followup. He is a pleasant 78 yo man with morbid obesity, chronic systolic heart failure, LBBB, s/p biv ICD insertion. He admits to dietary indiscretion. He was in the hospital last week with CHF. He feels better. His weight is improved.  Allergies  Allergen Reactions  . Codeine Other (See Comments)    constipation  . Erythromycin-Sulfisoxazole Other (See Comments)    Causes infection in throat and eyes     Current Outpatient Medications  Medication Sig Dispense Refill  . acetaminophen (TYLENOL) 650 MG CR tablet Take 650-1,300 mg by mouth daily as needed for pain.     Marland Kitchen aspirin EC 81 MG tablet Take 1 tablet (81 mg total) by mouth daily. 90 tablet 3  . ENTRESTO 49-51 MG TAKE 1 TABLET BY MOUTH TWICE A DAY 60 tablet 6  . famotidine (PEPCID) 20 MG tablet Take 20 mg by mouth at bedtime.    . finasteride (PROSCAR) 5 MG tablet Take 5 mg by mouth every evening.   4  . FLUoxetine (PROZAC) 20 MG capsule Take 20 mg by mouth every evening.    . furosemide (LASIX) 40 MG tablet Take 1-2 tablets (40-80 mg total) by mouth See admin instructions. Take 2 tabs (80mg ) by mouth every morning & 1 tab (40mg ) every afternoon    . gabapentin (NEURONTIN) 100 MG capsule Take 100-200 mg by mouth See admin instructions. 100mg  in the morning and 200mg  at bedtime    . Insulin Glargine-Lixisenatide (SOLIQUA) 100-33 UNT-MCG/ML SOPN Inject 30 Units into the skin every evening.    . levETIRAcetam (KEPPRA) 750 MG tablet Take 750 mg by mouth 2 (two) times daily.    loratadine (ALLERGY RELIEF) 10 MG tablet Take 10 mg by mouth daily as needed for allergies.    . metFORMIN (GLUCOPHAGE-XR) 500 MG 24 hr tablet Take 1,500 mg by mouth daily.    . metoprolol succinate (TOPROL-XL) 100 MG 24 hr tablet Take 100 mg by mouth daily.    nystatin cream (MYCOSTATIN) Apply 1 application topically daily as needed for dry skin.    . Omega-3 Fatty Acids (FISH OIL) 1000 MG CAPS Take 1,000 mg by mouth 2  (two) times daily.     omeprazole (PRILOSEC) 20 MG capsule Take 20 mg by mouth daily.    . simvastatin (ZOCOR) 80 MG tablet TAKE 80 MG (1 TABLET) DAILY. (Patient taking differently: Take 80 mg by mouth daily. ) 90 tablet 3  . Tamsulosin HCl (FLOMAX) 0.4 MG CAPS Take 0.8 mg by mouth every evening.    . warfarin (COUMADIN) 5 MG tablet Take 0.5-1 tablets (2.5-5 mg total) by mouth See admin instructions. Take 1/2 tablet (2.5mg ) daily except 1 tablet (5mg ) on Tuesdays, Thursdays and Saturdays     No current facility-administered medications for this visit.     Past Medical History:  Diagnosis Date  . CAD S/P percutaneous coronary angioplasty    remote PCIs in 1990's- last CFX PCI 2008  . Chronic kidney disease, stage III (moderate)   . Diabetes mellitus with complication (HCC)    with hyperglycemia and diabetic autonomic poly neuropathy  . Gastro-esophageal reflux disease with esophagitis   . Hyperlipidemia   . Hypertension   . Ischemic cardiomyopathy 01/20/2018   St Jude ICD   . Morbid obesity (HCC)   . Obstructive sleep apnea    on CPAP  . Polyneuropathy   . Primary insomnia     ROS:  All systems reviewed and negative except as noted in the HPI.   Past Surgical History:  Procedure Laterality Date  . BIV ICD INSERTION CRT-D N/A 01/20/2018   Procedure: BIV ICD INSERTION CRT-D;  Surgeon: Marinus Maw, MD;  Location: [facility] INVASIVE CV LAB;  Service: Cardiovascular;  Laterality: N/A;  . BIV PACEMAKER INSERTION CRT-P  01/20/2018   St Jude- Dr Ladona Ridgel  . CARDIAC CATHETERIZATION     multiple angioplasty X 8, 4 stents      Family History  Problem Relation Age of Onset  . Heart attack Father   . Breast cancer Mother   . CAD Mother   . Cancer Brother      Social History   Socioeconomic History  . Marital status: Married    Spouse name: Not on file  . Number of children: Not on file  . Years of education: Not on file  . Highest education level: Not on file    Occupational History  . Occupation: Retired  Tobacco Use  . Smoking status: Former Smoker    Packs/day: 1.00    Years: 30.00    Pack years: 30.00    Quit date: 09/01/2000    Years since quitting: 18.5  . Smokeless tobacco: Never Used  Substance and Sexual Activity  . Alcohol use: Yes    Alcohol/week: 0.0 standard drinks  . Drug use: No  . Sexual activity: Not on file  Other Topics Concern  . Not on file  Social History Narrative  . Not on file   Social Determinants of Health   Financial Resource Strain:   . Difficulty of Paying Living Expenses: Not on file  Food Insecurity:   . Worried About Programme researcher, broadcasting/film/video in the Last Year: Not on file  . Ran Out of Food in the Last Year: Not on file  Transportation Needs:   . Lack of Transportation (Medical): Not on file  . Lack of Transportation (Non-Medical): Not on file  Physical Activity:   . Days of Exercise per Week: Not on file  . Minutes of Exercise per Session: Not on file  Stress:   . Feeling of Stress : Not on file  Social Connections:   . Frequency of Communication with Friends and Family: Not on file  . Frequency of Social Gatherings with Friends and Family: Not on file  . Attends Religious Services: Not on file  . Active Member of Clubs or Organizations: Not on file  . Attends Banker Meetings: Not on file  . Marital Status: Not on file  Intimate Partner Violence:   . Fear of Current or Ex-Partner: Not on file  . Emotionally Abused: Not on file  . Physically Abused: Not on file  . Sexually Abused: Not on file     BP 140/78   Temp (!) 96.8 F (36 C)   Ht 5\' 8"  (1.727 m)   Wt 295 lb (133.8 kg)   SpO2 93%   BMI 44.85 kg/m   Physical Exam:  Well appearing NAD HEENT: Unremarkable Neck:  No JVD, no thyromegally Lymphatics:  No adenopathy Back:  No CVA tenderness Lungs:  Clear with no wheezes HEART:  Regular rate rhythm, no murmurs, no rubs, no clicks Abd:  soft, positive bowel sounds, no  organomegally, no rebound, no guarding Ext:  2 plus pulses, no edema, no cyanosis, no clubbing Skin:  No rashes no nodules Neuro:  CN II through XII intact, motor grossly intact  EKG  DEVICE  Normal device function.  See PaceArt for details.   Assess/Plan: 1. Chronic systolic heart failure - I encouraged him to lose weight, reduce his salt intake and increase his activity. He will continue his current meds. 2. Obesity - He is encouraged to lose weight. 3. ICD  - his biv ICD is working normally.   Mikle Bosworth.D.

## 2019-04-06 DIAGNOSIS — I5082 Biventricular heart failure: Secondary | ICD-10-CM | POA: Diagnosis not present

## 2019-04-06 DIAGNOSIS — I5043 Acute on chronic combined systolic (congestive) and diastolic (congestive) heart failure: Secondary | ICD-10-CM | POA: Diagnosis not present

## 2019-04-06 DIAGNOSIS — N1832 Chronic kidney disease, stage 3b: Secondary | ICD-10-CM | POA: Diagnosis not present

## 2019-04-06 DIAGNOSIS — G40909 Epilepsy, unspecified, not intractable, without status epilepticus: Secondary | ICD-10-CM | POA: Diagnosis not present

## 2019-04-06 DIAGNOSIS — I255 Ischemic cardiomyopathy: Secondary | ICD-10-CM | POA: Diagnosis not present

## 2019-04-06 DIAGNOSIS — I130 Hypertensive heart and chronic kidney disease with heart failure and stage 1 through stage 4 chronic kidney disease, or unspecified chronic kidney disease: Secondary | ICD-10-CM | POA: Diagnosis not present

## 2019-04-06 DIAGNOSIS — I480 Paroxysmal atrial fibrillation: Secondary | ICD-10-CM | POA: Diagnosis not present

## 2019-04-06 DIAGNOSIS — G4733 Obstructive sleep apnea (adult) (pediatric): Secondary | ICD-10-CM | POA: Diagnosis not present

## 2019-04-06 DIAGNOSIS — I2510 Atherosclerotic heart disease of native coronary artery without angina pectoris: Secondary | ICD-10-CM | POA: Diagnosis not present

## 2019-04-06 DIAGNOSIS — E1122 Type 2 diabetes mellitus with diabetic chronic kidney disease: Secondary | ICD-10-CM | POA: Diagnosis not present

## 2019-04-10 ENCOUNTER — Other Ambulatory Visit: Payer: Self-pay

## 2019-04-10 ENCOUNTER — Emergency Department (HOSPITAL_COMMUNITY): Payer: Medicare HMO

## 2019-04-10 ENCOUNTER — Encounter (HOSPITAL_COMMUNITY): Payer: Self-pay | Admitting: Emergency Medicine

## 2019-04-10 ENCOUNTER — Inpatient Hospital Stay (HOSPITAL_COMMUNITY)
Admission: EM | Admit: 2019-04-10 | Discharge: 2019-04-25 | DRG: 100 | Disposition: A | Discharge status: Skilled Nursing Facility | Payer: Medicare HMO | Attending: Family Medicine | Admitting: Family Medicine

## 2019-04-10 DIAGNOSIS — Z95810 Presence of automatic (implantable) cardiac defibrillator: Secondary | ICD-10-CM

## 2019-04-10 DIAGNOSIS — E118 Type 2 diabetes mellitus with unspecified complications: Secondary | ICD-10-CM

## 2019-04-10 DIAGNOSIS — R400 Somnolence: Secondary | ICD-10-CM | POA: Diagnosis not present

## 2019-04-10 DIAGNOSIS — N1831 Chronic kidney disease, stage 3a: Secondary | ICD-10-CM | POA: Diagnosis present

## 2019-04-10 DIAGNOSIS — G4733 Obstructive sleep apnea (adult) (pediatric): Secondary | ICD-10-CM | POA: Diagnosis not present

## 2019-04-10 DIAGNOSIS — R339 Retention of urine, unspecified: Secondary | ICD-10-CM | POA: Diagnosis not present

## 2019-04-10 DIAGNOSIS — G40209 Localization-related (focal) (partial) symptomatic epilepsy and epileptic syndromes with complex partial seizures, not intractable, without status epilepticus: Secondary | ICD-10-CM | POA: Diagnosis not present

## 2019-04-10 DIAGNOSIS — J189 Pneumonia, unspecified organism: Secondary | ICD-10-CM | POA: Diagnosis not present

## 2019-04-10 DIAGNOSIS — G40909 Epilepsy, unspecified, not intractable, without status epilepticus: Secondary | ICD-10-CM

## 2019-04-10 DIAGNOSIS — R319 Hematuria, unspecified: Secondary | ICD-10-CM | POA: Diagnosis not present

## 2019-04-10 DIAGNOSIS — F5101 Primary insomnia: Secondary | ICD-10-CM | POA: Diagnosis present

## 2019-04-10 DIAGNOSIS — I5022 Chronic systolic (congestive) heart failure: Secondary | ICD-10-CM | POA: Diagnosis present

## 2019-04-10 DIAGNOSIS — I4821 Permanent atrial fibrillation: Secondary | ICD-10-CM | POA: Diagnosis present

## 2019-04-10 DIAGNOSIS — Z7982 Long term (current) use of aspirin: Secondary | ICD-10-CM

## 2019-04-10 DIAGNOSIS — Z87891 Personal history of nicotine dependence: Secondary | ICD-10-CM

## 2019-04-10 DIAGNOSIS — I447 Left bundle-branch block, unspecified: Secondary | ICD-10-CM | POA: Diagnosis present

## 2019-04-10 DIAGNOSIS — R0902 Hypoxemia: Secondary | ICD-10-CM | POA: Diagnosis not present

## 2019-04-10 DIAGNOSIS — N170 Acute kidney failure with tubular necrosis: Secondary | ICD-10-CM | POA: Diagnosis not present

## 2019-04-10 DIAGNOSIS — I509 Heart failure, unspecified: Secondary | ICD-10-CM

## 2019-04-10 DIAGNOSIS — E1143 Type 2 diabetes mellitus with diabetic autonomic (poly)neuropathy: Secondary | ICD-10-CM | POA: Diagnosis present

## 2019-04-10 DIAGNOSIS — G9340 Encephalopathy, unspecified: Secondary | ICD-10-CM | POA: Diagnosis present

## 2019-04-10 DIAGNOSIS — R4689 Other symptoms and signs involving appearance and behavior: Secondary | ICD-10-CM

## 2019-04-10 DIAGNOSIS — I2510 Atherosclerotic heart disease of native coronary artery without angina pectoris: Secondary | ICD-10-CM

## 2019-04-10 DIAGNOSIS — Z9861 Coronary angioplasty status: Secondary | ICD-10-CM | POA: Diagnosis not present

## 2019-04-10 DIAGNOSIS — I130 Hypertensive heart and chronic kidney disease with heart failure and stage 1 through stage 4 chronic kidney disease, or unspecified chronic kidney disease: Secondary | ICD-10-CM | POA: Diagnosis present

## 2019-04-10 DIAGNOSIS — J9611 Chronic respiratory failure with hypoxia: Secondary | ICD-10-CM | POA: Diagnosis present

## 2019-04-10 DIAGNOSIS — R410 Disorientation, unspecified: Secondary | ICD-10-CM

## 2019-04-10 DIAGNOSIS — Z20822 Contact with and (suspected) exposure to covid-19: Secondary | ICD-10-CM | POA: Diagnosis present

## 2019-04-10 DIAGNOSIS — I255 Ischemic cardiomyopathy: Secondary | ICD-10-CM | POA: Diagnosis present

## 2019-04-10 DIAGNOSIS — Z885 Allergy status to narcotic agent status: Secondary | ICD-10-CM

## 2019-04-10 DIAGNOSIS — Z803 Family history of malignant neoplasm of breast: Secondary | ICD-10-CM

## 2019-04-10 DIAGNOSIS — E1122 Type 2 diabetes mellitus with diabetic chronic kidney disease: Secondary | ICD-10-CM | POA: Diagnosis present

## 2019-04-10 DIAGNOSIS — R040 Epistaxis: Secondary | ICD-10-CM | POA: Diagnosis not present

## 2019-04-10 DIAGNOSIS — Z881 Allergy status to other antibiotic agents status: Secondary | ICD-10-CM

## 2019-04-10 DIAGNOSIS — I480 Paroxysmal atrial fibrillation: Secondary | ICD-10-CM | POA: Diagnosis not present

## 2019-04-10 DIAGNOSIS — I499 Cardiac arrhythmia, unspecified: Secondary | ICD-10-CM | POA: Diagnosis not present

## 2019-04-10 DIAGNOSIS — R471 Dysarthria and anarthria: Secondary | ICD-10-CM | POA: Diagnosis present

## 2019-04-10 DIAGNOSIS — K2100 Gastro-esophageal reflux disease with esophagitis, without bleeding: Secondary | ICD-10-CM | POA: Diagnosis present

## 2019-04-10 DIAGNOSIS — N1832 Chronic kidney disease, stage 3b: Secondary | ICD-10-CM | POA: Diagnosis not present

## 2019-04-10 DIAGNOSIS — T465X5A Adverse effect of other antihypertensive drugs, initial encounter: Secondary | ICD-10-CM | POA: Diagnosis not present

## 2019-04-10 DIAGNOSIS — E1151 Type 2 diabetes mellitus with diabetic peripheral angiopathy without gangrene: Secondary | ICD-10-CM | POA: Diagnosis present

## 2019-04-10 DIAGNOSIS — Z03818 Encounter for observation for suspected exposure to other biological agents ruled out: Secondary | ICD-10-CM | POA: Diagnosis not present

## 2019-04-10 DIAGNOSIS — Z9981 Dependence on supplemental oxygen: Secondary | ICD-10-CM

## 2019-04-10 DIAGNOSIS — D631 Anemia in chronic kidney disease: Secondary | ICD-10-CM | POA: Diagnosis present

## 2019-04-10 DIAGNOSIS — I959 Hypotension, unspecified: Secondary | ICD-10-CM | POA: Diagnosis not present

## 2019-04-10 DIAGNOSIS — Z9989 Dependence on other enabling machines and devices: Secondary | ICD-10-CM | POA: Diagnosis present

## 2019-04-10 DIAGNOSIS — Z7901 Long term (current) use of anticoagulants: Secondary | ICD-10-CM

## 2019-04-10 DIAGNOSIS — I482 Chronic atrial fibrillation, unspecified: Secondary | ICD-10-CM | POA: Diagnosis not present

## 2019-04-10 DIAGNOSIS — Z8249 Family history of ischemic heart disease and other diseases of the circulatory system: Secondary | ICD-10-CM

## 2019-04-10 DIAGNOSIS — G3184 Mild cognitive impairment, so stated: Secondary | ICD-10-CM | POA: Diagnosis present

## 2019-04-10 DIAGNOSIS — E869 Volume depletion, unspecified: Secondary | ICD-10-CM | POA: Diagnosis not present

## 2019-04-10 DIAGNOSIS — N183 Chronic kidney disease, stage 3 unspecified: Secondary | ICD-10-CM | POA: Diagnosis present

## 2019-04-10 DIAGNOSIS — N179 Acute kidney failure, unspecified: Secondary | ICD-10-CM

## 2019-04-10 DIAGNOSIS — R4182 Altered mental status, unspecified: Secondary | ICD-10-CM | POA: Diagnosis not present

## 2019-04-10 DIAGNOSIS — E785 Hyperlipidemia, unspecified: Secondary | ICD-10-CM | POA: Diagnosis present

## 2019-04-10 DIAGNOSIS — R4020 Unspecified coma: Secondary | ICD-10-CM | POA: Diagnosis not present

## 2019-04-10 DIAGNOSIS — Z794 Long term (current) use of insulin: Secondary | ICD-10-CM | POA: Diagnosis present

## 2019-04-10 DIAGNOSIS — R9431 Abnormal electrocardiogram [ECG] [EKG]: Secondary | ICD-10-CM | POA: Diagnosis not present

## 2019-04-10 LAB — CBC
HCT: 36.8 % — ABNORMAL LOW (ref 39.0–52.0)
Hemoglobin: 11 g/dL — ABNORMAL LOW (ref 13.0–17.0)
MCH: 27.7 pg (ref 26.0–34.0)
MCHC: 29.9 g/dL — ABNORMAL LOW (ref 30.0–36.0)
MCV: 92.7 fL (ref 80.0–100.0)
Platelets: 159 10*3/uL (ref 150–400)
RBC: 3.97 MIL/uL — ABNORMAL LOW (ref 4.22–5.81)
RDW: 13.6 % (ref 11.5–15.5)
WBC: 8.7 10*3/uL (ref 4.0–10.5)
nRBC: 0 % (ref 0.0–0.2)

## 2019-04-10 LAB — BLOOD GAS, VENOUS
Acid-Base Excess: 5.5 mmol/L — ABNORMAL HIGH (ref 0.0–2.0)
Bicarbonate: 26.4 mmol/L (ref 20.0–28.0)
FIO2: 32
O2 Saturation: 62 %
Patient temperature: 36.5
pCO2, Ven: 60.4 mmHg — ABNORMAL HIGH (ref 44.0–60.0)
pH, Ven: 7.332 (ref 7.250–7.430)
pO2, Ven: 38 mmHg (ref 32.0–45.0)

## 2019-04-10 LAB — COMPREHENSIVE METABOLIC PANEL
ALT: 15 U/L (ref 0–44)
AST: 15 U/L (ref 15–41)
Albumin: 3.3 g/dL — ABNORMAL LOW (ref 3.5–5.0)
Alkaline Phosphatase: 68 U/L (ref 38–126)
Anion gap: 9 (ref 5–15)
BUN: 42 mg/dL — ABNORMAL HIGH (ref 8–23)
CO2: 29 mmol/L (ref 22–32)
Calcium: 9.3 mg/dL (ref 8.9–10.3)
Chloride: 105 mmol/L (ref 98–111)
Creatinine, Ser: 1.6 mg/dL — ABNORMAL HIGH (ref 0.61–1.24)
GFR calc Af Amer: 47 mL/min — ABNORMAL LOW (ref >60)
GFR calc non Af Amer: 41 mL/min — ABNORMAL LOW (ref >60)
Glucose, Bld: 118 mg/dL — ABNORMAL HIGH (ref 70–99)
Potassium: 4.9 mmol/L (ref 3.5–5.1)
Sodium: 143 mmol/L (ref 135–145)
Total Bilirubin: 0.6 mg/dL (ref 0.3–1.2)
Total Protein: 6.9 g/dL (ref 6.5–8.1)

## 2019-04-10 LAB — PROTIME-INR
INR: 2.3 — ABNORMAL HIGH (ref 0.8–1.2)
Prothrombin Time: 25.1 seconds — ABNORMAL HIGH (ref 11.4–15.2)

## 2019-04-10 LAB — LACTIC ACID, PLASMA: Lactic Acid, Venous: 0.8 mmol/L (ref 0.5–1.9)

## 2019-04-10 LAB — BRAIN NATRIURETIC PEPTIDE: B Natriuretic Peptide: 254 pg/mL — ABNORMAL HIGH (ref 0.0–100.0)

## 2019-04-10 LAB — CK: Total CK: 74 U/L (ref 49–397)

## 2019-04-10 NOTE — ED Provider Notes (Signed)
[facility] Emergency Department Provider Note MRN:  [id number]  Arrival date & time: 04/10/19     Chief Complaint   Altered Mental Status   History of Present Illness   Noah [name] is a 78 y.o. year-old male with a history of CAD, diabetes, hypertension, obesity presenting to the ED with chief complaint of altered mental status.  1 day of confusion, not recognizing people, stating nonsensical things such as "we have to get out of here".  Patient denies pain, is confused.  I was unable to obtain an accurate HPI, PMH, or ROS due to the patient's altered mental status.  Level 5 caveat.  Review of Systems  Positive for altered mental status.  Patient's Health History    Past Medical History:  Diagnosis Date  . CAD S/P percutaneous coronary angioplasty    remote PCIs in 1990's- last CFX PCI 2008  . Chronic kidney disease, stage III (moderate)   . Diabetes mellitus with complication (HCC)    with hyperglycemia and diabetic autonomic poly neuropathy  . Gastro-esophageal reflux disease with esophagitis   . Hyperlipidemia   . Hypertension   . Ischemic cardiomyopathy 01/20/2018   St Jude ICD   . Morbid obesity ([place])   . Obstructive sleep apnea    on CPAP  . Polyneuropathy   . Primary insomnia     Past Surgical History:  Procedure Laterality Date  . BIV ICD INSERTION CRT-D N/A 01/20/2018   Procedure: BIV ICD INSERTION CRT-D;  Surgeon: Evans Lance, MD;  Location: [facility] CV [facility];  Service: Cardiovascular;  Laterality: N/A;  . BIV PACEMAKER INSERTION CRT-P  01/20/2018   St Jude- Dr Lovena Le  . CARDIAC CATHETERIZATION     multiple angioplasty X 8, 4 stents     Family History  Problem Relation Age of Onset  . Heart attack Father   . Breast cancer Mother   . CAD Mother   . Cancer Brother     Social History   Socioeconomic History  . Marital status: Married    Spouse name: Not on file  . Number of children: Not on file  . Years of  education: Not on file  . Highest education level: Not on file  Occupational History  . Occupation: Retired  Tobacco Use  . Smoking status: Former Smoker    Packs/day: 1.00    Years: 30.00    Pack years: 30.00    Quit date: 09/01/2000    Years since quitting: 18.6  . Smokeless tobacco: Never Used  Substance and Sexual Activity  . Alcohol use: Yes    Alcohol/week: 0.0 standard drinks  . Drug use: No  . Sexual activity: Not on file  Other Topics Concern  . Not on file  Social History Narrative  . Not on file   Social Determinants of Health   Financial Resource Strain:   . Difficulty of Paying Living Expenses: Not on file  Food Insecurity:   . Worried About Charity fundraiser in the Last Year: Not on file  . Ran Out of Food in the Last Year: Not on file  Transportation Needs:   . Lack of Transportation (Medical): Not on file  . Lack of Transportation (Non-Medical): Not on file  Physical Activity:   . Days of Exercise per Week: Not on file  . Minutes of Exercise per Session: Not on file  Stress:   . Feeling of Stress : Not on file  Social Connections:   .  Frequency of Communication with Friends and Family: Not on file  . Frequency of Social Gatherings with Friends and Family: Not on file  . Attends Religious Services: Not on file  . Active Member of Clubs or Organizations: Not on file  . Attends Banker Meetings: Not on file  . Marital Status: Not on file  Intimate Partner Violence:   . Fear of Current or Ex-Partner: Not on file  . Emotionally Abused: Not on file  . Physically Abused: Not on file  . Sexually Abused: Not on file     Physical Exam  Vital Signs and Nursing Notes reviewed Vitals:   04/10/19 2200 04/10/19 2230  BP: 94/80   Pulse: 97 93  Resp: 18 16  Temp:    SpO2: 97% 99%    CONSTITUTIONAL: Chronically ill-appearing, NAD, obese NEURO: Awake, alert, oriented to name, moving all extremities EYES:  eyes equal and reactive ENT/NECK:  no  LAD, no JVD CARDIO: Regular rate, well-perfused, normal S1 and S2 PULM:  CTAB no wheezing or rhonchi GI/GU:  normal bowel sounds, non-distended, non-tender MSK/SPINE:  No gross deformities, no edema SKIN:  no rash, atraumatic PSYCH:  Appropriate speech and behavior  Diagnostic and Interventional Summary    EKG Interpretation  Date/Time:  Tuesday April 10 2019 19:51:58 EST Ventricular Rate:  96 PR Interval:    QRS Duration: 132 QT Interval:  413 QTC Calculation: 522 R Axis:   -96 Text Interpretation: Sinus rhythm LBBB Pattern No significant change was found Confirmed by Kennis Carina [phone]) on 04/10/2019 7:54:55 PM      Labs Reviewed  CBC - Abnormal; Notable for the following components:      Result Value   RBC 3.97 (*)    Hemoglobin 11.0 (*)    HCT 36.8 (*)    MCHC 29.9 (*)    All other components within normal limits  COMPREHENSIVE METABOLIC PANEL - Abnormal; Notable for the following components:   Glucose, Bld 118 (*)    BUN 42 (*)    Creatinine, Ser 1.60 (*)    Albumin 3.3 (*)    GFR calc non Af Amer 41 (*)    GFR calc Af Amer 47 (*)    All other components within normal limits  BRAIN NATRIURETIC PEPTIDE - Abnormal; Notable for the following components:   B Natriuretic Peptide 254.0 (*)    All other components within normal limits  PROTIME-INR - Abnormal; Notable for the following components:   Prothrombin Time 25.1 (*)    INR 2.3 (*)    All other components within normal limits  BLOOD GAS, VENOUS - Abnormal; Notable for the following components:   pCO2, Ven 60.4 (*)    Acid-Base Excess 5.5 (*)    All other components within normal limits  SARS CORONAVIRUS 2 (TAT 6-24 HRS)  LACTIC ACID, PLASMA  CK  URINALYSIS, ROUTINE W REFLEX MICROSCOPIC    XR Chest Single View  Final Result    CT Head  Final Result      Medications - No data to display   Procedures  /  Critical Care .Critical Care Performed by: Sabas Sous, MD Authorized by: Sabas Sous, MD   Critical care provider statement:    Critical care time (minutes):  34   Critical care was necessary to treat or prevent imminent or life-threatening deterioration of the following conditions:  CNS failure or compromise (Delirium)   Critical care was time spent personally by me on the following  activities:  Discussions with consultants, evaluation of patient's response to treatment, examination of patient, ordering and performing treatments and interventions, ordering and review of laboratory studies, ordering and review of radiographic studies, pulse oximetry, re-evaluation of patient's condition, obtaining history from patient or surrogate and review of old charts    ED Course and Medical Decision Making  I have reviewed the triage vital signs and the nursing notes.  Pertinent labs & imaging results that were available during my care of the patient were reviewed by me and considered in my medical decision making (see below for details).     History obtained from both patient and patient's wife over the phone.  Acute altered mental status, concerning for delirium.  Patient is moving all extremities, no facial droop, no aphasia, no focal neurological findings to suggest stroke.  Patient does have a history of intracranial hemorrhage, will need CT imaging of the head.  Also considering metabolic disarray, early infection or sepsis, UTI.  Work-up pending.  Work-up largely unremarkable, UA still pending, admitted to hospital service for further care.  Elmer Sow. Pilar Plate, MD [facility] Emergency Medicine [facility] [email or web address]  Final Clinical Impressions(s) / ED Diagnoses     ICD-10-CM   1. Delirium  R41.0   2. Altered behavior  R46.89 XR Chest Single View    XR Chest Single View    ED Discharge Orders    None       Discharge Instructions Discussed with and Provided to Patient:   Discharge Instructions   None       Sabas Sous,  MD 04/10/19 2249

## 2019-04-10 NOTE — ED Notes (Signed)
Patient states that he would rather not have an in-and-out cath. Patient wants a few minutes to see if he can urinate.

## 2019-04-10 NOTE — ED Triage Notes (Signed)
Patient brought in by EMS for altered mental status x 1 day. 157 cbg. Patient has a pacemaker. Patient on 3 liters of home oxygen. Patient alert and stable at this time.

## 2019-04-10 NOTE — ED Notes (Signed)
Patient transported to CT 

## 2019-04-10 NOTE — ED Notes (Signed)
Patient refused in-and -out cath. Condom cath placed on patient in order to collect a urine.

## 2019-04-11 ENCOUNTER — Encounter (HOSPITAL_COMMUNITY): Payer: Self-pay | Admitting: Family Medicine

## 2019-04-11 DIAGNOSIS — G40909 Epilepsy, unspecified, not intractable, without status epilepticus: Secondary | ICD-10-CM | POA: Diagnosis not present

## 2019-04-11 DIAGNOSIS — G9340 Encephalopathy, unspecified: Secondary | ICD-10-CM | POA: Diagnosis not present

## 2019-04-11 DIAGNOSIS — I5022 Chronic systolic (congestive) heart failure: Secondary | ICD-10-CM | POA: Diagnosis not present

## 2019-04-11 DIAGNOSIS — I480 Paroxysmal atrial fibrillation: Secondary | ICD-10-CM | POA: Diagnosis not present

## 2019-04-11 DIAGNOSIS — G4733 Obstructive sleep apnea (adult) (pediatric): Secondary | ICD-10-CM | POA: Diagnosis not present

## 2019-04-11 DIAGNOSIS — N1832 Chronic kidney disease, stage 3b: Secondary | ICD-10-CM | POA: Diagnosis not present

## 2019-04-11 LAB — COMPREHENSIVE METABOLIC PANEL
ALT: 13 U/L (ref 0–44)
AST: 14 U/L — ABNORMAL LOW (ref 15–41)
Albumin: 2.8 g/dL — ABNORMAL LOW (ref 3.5–5.0)
Alkaline Phosphatase: 61 U/L (ref 38–126)
Anion gap: 12 (ref 5–15)
BUN: 41 mg/dL — ABNORMAL HIGH (ref 8–23)
CO2: 28 mmol/L (ref 22–32)
Calcium: 9.2 mg/dL (ref 8.9–10.3)
Chloride: 103 mmol/L (ref 98–111)
Creatinine, Ser: 1.57 mg/dL — ABNORMAL HIGH (ref 0.61–1.24)
GFR calc Af Amer: 48 mL/min — ABNORMAL LOW (ref >60)
GFR calc non Af Amer: 42 mL/min — ABNORMAL LOW (ref >60)
Glucose, Bld: 92 mg/dL (ref 70–99)
Potassium: 4.7 mmol/L (ref 3.5–5.1)
Sodium: 143 mmol/L (ref 135–145)
Total Bilirubin: 0.8 mg/dL (ref 0.3–1.2)
Total Protein: 6.2 g/dL — ABNORMAL LOW (ref 6.5–8.1)

## 2019-04-11 LAB — URINALYSIS, ROUTINE W REFLEX MICROSCOPIC
Bacteria, UA: NONE SEEN
Bilirubin Urine: NEGATIVE
Glucose, UA: NEGATIVE mg/dL
Ketones, ur: NEGATIVE mg/dL
Nitrite: NEGATIVE
Protein, ur: 100 mg/dL — AB
RBC / HPF: >50 RBC/hpf — ABNORMAL HIGH (ref 0–5)
Specific Gravity, Urine: 1.01 (ref 1.005–1.030)
pH: 5 (ref 5.0–8.0)

## 2019-04-11 LAB — CBC
HCT: 33.3 % — ABNORMAL LOW (ref 39.0–52.0)
Hemoglobin: 10 g/dL — ABNORMAL LOW (ref 13.0–17.0)
MCH: 27.5 pg (ref 26.0–34.0)
MCHC: 30 g/dL (ref 30.0–36.0)
MCV: 91.7 fL (ref 80.0–100.0)
Platelets: 153 10*3/uL (ref 150–400)
RBC: 3.63 MIL/uL — ABNORMAL LOW (ref 4.22–5.81)
RDW: 13.3 % (ref 11.5–15.5)
WBC: 9.8 10*3/uL (ref 4.0–10.5)
nRBC: 0 % (ref 0.0–0.2)

## 2019-04-11 LAB — GLUCOSE, CAPILLARY
Glucose-Capillary: 84 mg/dL (ref 70–99)
Glucose-Capillary: 101 mg/dL — ABNORMAL HIGH (ref 70–99)
Glucose-Capillary: 80 mg/dL (ref 70–99)
Glucose-Capillary: 119 mg/dL — ABNORMAL HIGH (ref 70–99)

## 2019-04-11 LAB — TSH: TSH: 3.689 u[IU]/mL (ref 0.350–4.500)

## 2019-04-11 LAB — PROTIME-INR
INR: 2.7 — ABNORMAL HIGH (ref 0.8–1.2)
Prothrombin Time: 28.6 seconds — ABNORMAL HIGH (ref 11.4–15.2)

## 2019-04-11 LAB — RPR: RPR Ser Ql: NONREACTIVE

## 2019-04-11 LAB — SARS CORONAVIRUS 2 (TAT 6-24 HRS): SARS Coronavirus 2: NEGATIVE

## 2019-04-11 LAB — AMMONIA: Ammonia: 34 umol/L (ref 9–35)

## 2019-04-11 LAB — HIV ANTIBODY (ROUTINE TESTING W REFLEX): HIV Screen 4th Generation wRfx: NONREACTIVE

## 2019-04-11 LAB — VITAMIN B12: Vitamin B-12: 407 pg/mL (ref 180–914)

## 2019-04-11 MED ORDER — ACETAMINOPHEN 650 MG RE SUPP: 650.0000 mg | Freq: Four times a day (QID) | RECTAL | Status: DC | PRN

## 2019-04-11 MED ORDER — SODIUM CHLORIDE 0.9% FLUSH: 3.0000 mL | INTRAVENOUS | Status: DC | PRN

## 2019-04-11 MED ORDER — WARFARIN SODIUM 2.5 MG PO TABS
2.5000 mg | ORAL_TABLET | Freq: Once | ORAL | Status: AC
  Administered 2019-04-11: 2.5 mg via ORAL
  Filled 2019-04-11: qty 1

## 2019-04-11 MED ORDER — ASPIRIN EC 81 MG PO TBEC
81.0000 mg | DELAYED_RELEASE_TABLET | Freq: Every day | ORAL | Status: DC
  Administered 2019-04-11 – 2019-04-25 (×15): 81 mg via ORAL
  Filled 2019-04-11 (×15): qty 1

## 2019-04-11 MED ORDER — WARFARIN SODIUM 2.5 MG PO TABS: 2.5000 mg | ORAL_TABLET | ORAL | Status: DC

## 2019-04-11 MED ORDER — FLUOXETINE HCL 20 MG PO CAPS
20.0000 mg | ORAL_CAPSULE | Freq: Every evening | ORAL | Status: DC
  Administered 2019-04-11 – 2019-04-24 (×14): 20 mg via ORAL
  Filled 2019-04-11 (×7): qty 1
  Filled 2019-04-11: qty 2
  Filled 2019-04-11 (×6): qty 1

## 2019-04-11 MED ORDER — ACETAMINOPHEN 325 MG PO TABS
650.0000 mg | ORAL_TABLET | Freq: Four times a day (QID) | ORAL | Status: DC | PRN
  Administered 2019-04-16 – 2019-04-17 (×2): 650 mg via ORAL
  Filled 2019-04-11 (×2): qty 2

## 2019-04-11 MED ORDER — SODIUM CHLORIDE 0.9 % IV SOLN: 250.0000 mL | INTRAVENOUS | Status: DC | PRN

## 2019-04-11 MED ORDER — WARFARIN - PHARMACIST DOSING INPATIENT: Freq: Every day | Status: DC

## 2019-04-11 MED ORDER — WARFARIN SODIUM 5 MG PO TABS: 5.0000 mg | ORAL_TABLET | ORAL | Status: DC

## 2019-04-11 MED ORDER — SODIUM CHLORIDE 0.9% FLUSH
3.0000 mL | Freq: Two times a day (BID) | INTRAVENOUS | Status: DC
  Administered 2019-04-11 – 2019-04-25 (×26): 3 mL via INTRAVENOUS

## 2019-04-11 MED ORDER — INSULIN ASPART 100 UNIT/ML ~~LOC~~ SOLN
0.0000 [IU] | Freq: Three times a day (TID) | SUBCUTANEOUS | Status: DC
  Administered 2019-04-13: 3 [IU] via SUBCUTANEOUS
  Administered 2019-04-13 – 2019-04-14 (×2): 2 [IU] via SUBCUTANEOUS
  Administered 2019-04-14: 3 [IU] via SUBCUTANEOUS
  Administered 2019-04-14 – 2019-04-15 (×3): 2 [IU] via SUBCUTANEOUS
  Administered 2019-04-15 – 2019-04-16 (×2): 3 [IU] via SUBCUTANEOUS
  Administered 2019-04-16: 5 [IU] via SUBCUTANEOUS
  Administered 2019-04-16: 7 [IU] via SUBCUTANEOUS
  Administered 2019-04-17 – 2019-04-18 (×4): 2 [IU] via SUBCUTANEOUS
  Administered 2019-04-18: 1 [IU] via SUBCUTANEOUS
  Administered 2019-04-18: 2 [IU] via SUBCUTANEOUS
  Administered 2019-04-19 (×2): 1 [IU] via SUBCUTANEOUS
  Administered 2019-04-20 – 2019-04-21 (×2): 2 [IU] via SUBCUTANEOUS
  Administered 2019-04-21: 1 [IU] via SUBCUTANEOUS
  Administered 2019-04-21 – 2019-04-24 (×8): 2 [IU] via SUBCUTANEOUS
  Administered 2019-04-24: 3 [IU] via SUBCUTANEOUS
  Administered 2019-04-25 (×2): 2 [IU] via SUBCUTANEOUS

## 2019-04-11 MED ORDER — FUROSEMIDE 40 MG PO TABS: 40.0000 mg | ORAL_TABLET | ORAL | Status: DC

## 2019-04-11 MED ORDER — NYSTATIN 100000 UNIT/GM EX POWD
Freq: Two times a day (BID) | CUTANEOUS | Status: DC
  Filled 2019-04-11 (×2): qty 15

## 2019-04-11 MED ORDER — ONDANSETRON HCL 4 MG/2ML IJ SOLN: 4.0000 mg | Freq: Four times a day (QID) | INTRAMUSCULAR | Status: DC | PRN

## 2019-04-11 MED ORDER — ONDANSETRON HCL 4 MG PO TABS: 4.0000 mg | ORAL_TABLET | Freq: Four times a day (QID) | ORAL | Status: DC | PRN

## 2019-04-11 MED ORDER — SACUBITRIL-VALSARTAN 49-51 MG PO TABS
1.0000 | ORAL_TABLET | Freq: Two times a day (BID) | ORAL | Status: DC
  Administered 2019-04-11 – 2019-04-14 (×7): 1 via ORAL
  Filled 2019-04-11 (×7): qty 1

## 2019-04-11 MED ORDER — FUROSEMIDE 40 MG PO TABS
40.0000 mg | ORAL_TABLET | Freq: Every day | ORAL | Status: DC
  Administered 2019-04-11 – 2019-04-12 (×2): 40 mg via ORAL
  Filled 2019-04-11 (×2): qty 1

## 2019-04-11 MED ORDER — SODIUM CHLORIDE 0.9% FLUSH
3.0000 mL | Freq: Two times a day (BID) | INTRAVENOUS | Status: DC
  Administered 2019-04-11 – 2019-04-25 (×17): 3 mL via INTRAVENOUS

## 2019-04-11 MED ORDER — FINASTERIDE 5 MG PO TABS
5.0000 mg | ORAL_TABLET | Freq: Every evening | ORAL | Status: DC
  Administered 2019-04-11 – 2019-04-24 (×14): 5 mg via ORAL
  Filled 2019-04-11 (×14): qty 1

## 2019-04-11 MED ORDER — METOPROLOL SUCCINATE ER 50 MG PO TB24
100.0000 mg | ORAL_TABLET | Freq: Every day | ORAL | Status: DC
  Administered 2019-04-11 – 2019-04-14 (×4): 100 mg via ORAL
  Filled 2019-04-11 (×4): qty 2

## 2019-04-11 MED ORDER — GABAPENTIN 100 MG PO CAPS: 100.0000 mg | ORAL_CAPSULE | ORAL | Status: DC

## 2019-04-11 MED ORDER — SENNOSIDES-DOCUSATE SODIUM 8.6-50 MG PO TABS: 1.0000 | ORAL_TABLET | Freq: Every evening | ORAL | Status: DC | PRN

## 2019-04-11 MED ORDER — GABAPENTIN 100 MG PO CAPS
100.0000 mg | ORAL_CAPSULE | Freq: Every day | ORAL | Status: DC
  Administered 2019-04-11 – 2019-04-25 (×15): 100 mg via ORAL
  Filled 2019-04-11 (×15): qty 1

## 2019-04-11 MED ORDER — FUROSEMIDE 80 MG PO TABS
80.0000 mg | ORAL_TABLET | ORAL | Status: DC
  Administered 2019-04-11 – 2019-04-13 (×3): 80 mg via ORAL
  Filled 2019-04-11 (×3): qty 1

## 2019-04-11 MED ORDER — ATORVASTATIN CALCIUM 40 MG PO TABS
40.0000 mg | ORAL_TABLET | Freq: Every day | ORAL | Status: DC
  Administered 2019-04-11 – 2019-04-24 (×14): 40 mg via ORAL
  Filled 2019-04-11 (×14): qty 1

## 2019-04-11 MED ORDER — LEVETIRACETAM 500 MG PO TABS
750.0000 mg | ORAL_TABLET | Freq: Two times a day (BID) | ORAL | Status: DC
  Administered 2019-04-11 – 2019-04-16 (×12): 750 mg via ORAL
  Filled 2019-04-11 (×12): qty 1

## 2019-04-11 MED ORDER — GABAPENTIN 100 MG PO CAPS
200.0000 mg | ORAL_CAPSULE | Freq: Every day | ORAL | Status: DC
  Administered 2019-04-11 – 2019-04-24 (×14): 200 mg via ORAL
  Filled 2019-04-11 (×14): qty 2

## 2019-04-11 MED ORDER — TAMSULOSIN HCL 0.4 MG PO CAPS
0.8000 mg | ORAL_CAPSULE | Freq: Every evening | ORAL | Status: DC
  Administered 2019-04-11 – 2019-04-24 (×14): 0.8 mg via ORAL
  Filled 2019-04-11 (×14): qty 2

## 2019-04-11 MED ORDER — FAMOTIDINE 20 MG PO TABS
20.0000 mg | ORAL_TABLET | Freq: Every day | ORAL | Status: DC
  Administered 2019-04-11 – 2019-04-24 (×14): 20 mg via ORAL
  Filled 2019-04-11 (×14): qty 1

## 2019-04-11 MED ORDER — PANTOPRAZOLE SODIUM 40 MG PO TBEC
40.0000 mg | DELAYED_RELEASE_TABLET | Freq: Every day | ORAL | Status: DC
  Administered 2019-04-11 – 2019-04-25 (×15): 40 mg via ORAL
  Filled 2019-04-11 (×15): qty 1

## 2019-04-11 NOTE — H&P (Addendum)
History and Physical    Noah [name] ERX:[id number] DOB: Mar 24, 1941 DOA: 04/10/2019  PCP: Manon Hilding, MD   Patient coming from: Home   Chief Complaint: Confusion   HPI: Noah [name] is a 78 y.o. male with medical history significant for CAD, ischemic cardiomyopathy, insulin-dependent diabetes mellitus, OSA, chronic 3 L/min supplemental oxygen requirement, chronic kidney disease stage III, and seizures, presenting to the emergency department with 1 day of confusion.  Patient was reportedly in his usual state yesterday which is alert and fully oriented, but has been confused today per report of his family.  The patient also acknowledges feeling confused, but denies any headache, change in vision or hearing, or new focal numbness or weakness.  There has not been any recent fall or trauma reported and the patient is not known to use alcohol or illicit substances.  ED Course: Upon arrival to the ED, patient is found to be afebrile, saturating 99% on 3 L/min of supplemental oxygen, and with stable blood pressure.  EKG features sinus rhythm with chronic LBBB.  Noncontrast head CT is negative for acute intracranial abnormality.  Chest x-ray is negative for acute cardiopulmonary disease.  Chemistry panel is notable for creatinine 1.60, similar to priors.  CBC with mild normocytic anemia.  INR is therapeutic at 2.3.  Lactic acid reassuringly normal.  COVID-19 PCR screening test has not yet resulted.  Urinalysis ordered but not yet resulted.  Hospitalist asked to admit.  Review of Systems:  All other systems reviewed and apart from HPI, are negative.  Past Medical History:  Diagnosis Date  . CAD S/P percutaneous coronary angioplasty    remote PCIs in 1990's- last CFX PCI 2008  . Chronic kidney disease, stage III (moderate)   . Diabetes mellitus with complication (HCC)    with hyperglycemia and diabetic autonomic poly neuropathy  . Gastro-esophageal reflux disease with esophagitis   .  Hyperlipidemia   . Hypertension   . Ischemic cardiomyopathy 01/20/2018   St Jude ICD   . Morbid obesity ([place])   . Obstructive sleep apnea    on CPAP  . Polyneuropathy   . Primary insomnia     Past Surgical History:  Procedure Laterality Date  . BIV ICD INSERTION CRT-D N/A 01/20/2018   Procedure: BIV ICD INSERTION CRT-D;  Surgeon: Evans Lance, MD;  Location: [facility];  Service: Cardiovascular;  Laterality: N/A;  . BIV PACEMAKER INSERTION CRT-P  01/20/2018   St Jude- Dr Lovena Le  . CARDIAC CATHETERIZATION     multiple angioplasty X 8, 4 stents      reports that he quit smoking about 18 years ago. He has a 30.00 pack-year smoking history. He has never used smokeless tobacco. He reports current alcohol use. He reports that he does not use drugs.  Allergies  Allergen Reactions  . Codeine Other (See Comments)    constipation  . Erythromycin-Sulfisoxazole Other (See Comments)    Causes infection in throat and eyes    Family History  Problem Relation Age of Onset  . Heart attack Father   . Breast cancer Mother   . CAD Mother   . Cancer Brother      Prior to Admission medications   Medication Sig Start Date End Date Taking? Authorizing Provider  ENTRESTO 49-51 MG TAKE 1 TABLET BY MOUTH TWICE A DAY 04/03/19  Yes Herminio Commons, MD  famotidine (PEPCID) 20 MG tablet Take 20 mg by mouth at bedtime. 08/15/18  Yes [provider]  finasteride (PROSCAR) 5 MG tablet Take 5 mg by mouth every evening.  08/13/17  Yes [provider]  FLUoxetine (PROZAC) 20 MG capsule Take 20 mg by mouth every evening.   Yes [provider]  furosemide (LASIX) 40 MG tablet Take 1-2 tablets (40-80 mg total) by mouth See admin instructions. Take 2 tabs (80mg ) by mouth every morning & 1 tab (40mg ) every afternoon 04/01/19  Yes Johnson, Clanford L, MD  gabapentin (NEURONTIN) 100 MG capsule Take 100-200 mg by mouth See admin instructions. 100mg  in the morning and 200mg   at bedtime 10/05/17  Yes [provider]  Insulin Glargine-Lixisenatide (SOLIQUA) 100-33 UNT-MCG/ML SOPN Inject 30 Units into the skin every evening. 03/31/19  Yes Johnson, Clanford L, MD  levETIRAcetam (KEPPRA) 750 MG tablet Take 750 mg by mouth 2 (two) times daily. 07/10/18  Yes [provider]  metoprolol succinate (TOPROL-XL) 100 MG 24 hr tablet Take 100 mg by mouth daily. 01/02/19  Yes [provider]  omeprazole (PRILOSEC) 20 MG capsule Take 20 mg by mouth daily.   Yes [provider]  simvastatin (ZOCOR) 80 MG tablet TAKE 80 MG (1 TABLET) DAILY. Patient taking differently: Take 80 mg by mouth daily.  11/15/18  Yes Laqueta Linden, MD  Tamsulosin HCl (FLOMAX) 0.4 MG CAPS Take 0.8 mg by mouth every evening.   Yes [provider]  warfarin (COUMADIN) 5 MG tablet Take 0.5-1 tablets (2.5-5 mg total) by mouth See admin instructions. Take 1/2 tablet (2.5mg ) daily except 1 tablet (5mg ) on Tuesdays, Thursdays and Saturdays 03/31/19  Yes Johnson, Clanford L, MD  acetaminophen (TYLENOL) 650 MG CR tablet Take 650-1,300 mg by mouth daily as needed for pain.     [provider]  aspirin EC 81 MG tablet Take 1 tablet (81 mg total) by mouth daily. 10/10/17   Laqueta Linden, MD  loratadine (ALLERGY RELIEF) 10 MG tablet Take 10 mg by mouth daily as needed for allergies.    [provider]  metFORMIN (GLUCOPHAGE-XR) 500 MG 24 hr tablet Take 1,500 mg by mouth daily.    [provider]  nystatin cream (MYCOSTATIN) Apply 1 application topically daily as needed for dry skin.    [provider]  Omega-3 Fatty Acids (FISH OIL) 1000 MG CAPS Take 1,000 mg by mouth 2 (two) times daily.     [provider]    Physical Exam: Vitals:   04/10/19 2230 04/10/19 2300 04/10/19 2330 04/11/19 0000  BP:  120/78 126/60 133/69  Pulse: 93 94 97 98  Resp: 16 17 17 15   Temp:      TempSrc:      SpO2: 99% 97% 97% 98%  Weight:        Height:        Constitutional: NAD, calm  Eyes: PERTLA, lids and conjunctivae normal ENMT: Mucous membranes are moist. Posterior pharynx clear of any exudate or lesions.   Neck: normal, supple, no masses, no thyromegaly Respiratory:  no wheezing, no crackles. No accessory muscle use.  Cardiovascular: S1 & S2 heard, regular rate and rhythm. Mild lower leg swelling bilaterally.  Abdomen: obese, non-tender, soft. Bowel sounds active.  Musculoskeletal: no clubbing / cyanosis. No joint deformity upper and lower extremities.  Skin: no significant rashes, lesions, ulcers. Warm, dry, well-perfused. Neurologic: No gross facial asymmetry. Mild dysarthria. No aphasia. Sensation intact. Moving all extremities.   Psychiatric:  Alert and oriented to person and place only. Calm, cooperative.     Labs on Admission:  I have personally reviewed following labs and imaging studies  CBC: Recent Labs  Lab 04/10/19 1954  WBC 8.7  HGB 11.0*  HCT 36.8*  MCV 92.7  PLT 159   Basic Metabolic Panel: Recent Labs  Lab 04/10/19 1954  NA 143  K 4.9  CL 105  CO2 29  GLUCOSE 118*  BUN 42*  CREATININE 1.60*  CALCIUM 9.3   GFR: Estimated Creatinine Clearance: 50 mL/min (A) (by C-G formula based on SCr of 1.6 mg/dL (H)). Liver Function Tests: Recent Labs  Lab 04/10/19 1954  AST 15  ALT 15  ALKPHOS 68  BILITOT 0.6  PROT 6.9  ALBUMIN 3.3*   No results for input(s): LIPASE, AMYLASE in the last 168 hours. No results for input(s): AMMONIA in the last 168 hours. Coagulation Profile: Recent Labs  Lab 04/10/19 1954  INR 2.3*   Cardiac Enzymes: Recent Labs  Lab 04/10/19 1954  CKTOTAL 74   BNP (last 3 results) No results for input(s): PROBNP in the last 8760 hours. HbA1C: No results for input(s): HGBA1C in the last 72 hours. CBG: No results for input(s): GLUCAP in the last 168 hours. Lipid Profile: No results for input(s): CHOL, HDL, LDLCALC, TRIG, CHOLHDL, LDLDIRECT in the last 72  hours. Thyroid Function Tests: No results for input(s): TSH, T4TOTAL, FREET4, T3FREE, THYROIDAB in the last 72 hours. Anemia Panel: No results for input(s): VITAMINB12, FOLATE, FERRITIN, TIBC, IRON, RETICCTPCT in the last 72 hours. Urine analysis:    Component Value Date/Time   COLORURINE STRAW (A) 03/28/2019 2031   APPEARANCEUR CLEAR 03/28/2019 2031   LABSPEC 1.006 03/28/2019 2031   PHURINE 5.0 03/28/2019 2031   GLUCOSEU NEGATIVE 03/28/2019 2031   HGBUR SMALL (A) 03/28/2019 2031   BILIRUBINUR NEGATIVE 03/28/2019 2031   KETONESUR NEGATIVE 03/28/2019 2031   PROTEINUR 30 (A) 03/28/2019 2031   NITRITE NEGATIVE 03/28/2019 2031   LEUKOCYTESUR NEGATIVE 03/28/2019 2031   Sepsis Labs: @LABRCNTIP (procalcitonin:4,lacticidven:4) )No results found for this or any previous visit (from the past 240 hour(s)).   Radiological Exams on Admission: CT Head  Result Date: 04/10/2019 CLINICAL DATA:  Encephalopathy. History of intracranial hemorrhage. EXAM: CT HEAD WITHOUT CONTRAST TECHNIQUE: Contiguous axial images were obtained from the base of the skull through the vertex without intravenous contrast. COMPARISON:  Head CT 10/24/2018 FINDINGS: Brain: There is no mass, hemorrhage or extra-axial collection. The size and configuration of the ventricles and extra-axial CSF spaces are normal. There is hypoattenuation of the white matter, most commonly indicating chronic small vessel disease. Vascular: Atherosclerotic calcification of the vertebral and internal carotid arteries at the skull base. No abnormal hyperdensity of the major intracranial arteries or dural venous sinuses. Skull: The visualized skull base, calvarium and extracranial soft tissues are normal. Sinuses/Orbits: No fluid levels or advanced mucosal thickening of the visualized paranasal sinuses. No mastoid or middle ear effusion. The orbits are normal. 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 Electronically  Signed   By: Deatra RobinsonKevin  Herman M.D.   On: 04/10/2019 20:16   XR Chest Single View  Result Date: 04/10/2019 CLINICAL DATA:  Altered mental status. 3 L home O2 EXAM: PORTABLE CHEST 1 VIEW COMPARISON:  03/28/2019 FINDINGS: Cardiomediastinal contours are stable. Lung volumes remain low. There is a multi lead pacer defibrillator with power pack over left chest as before. Azygos fissure again noted. Lungs are clear. No acute bone finding. IMPRESSION: Stable exam. No active cardiopulmonary disease. Electronically Signed   By: Donzetta KohutGeoffrey  Wile M.D.   On: 04/10/2019 20:14  EKG: Independently reviewed. Sinus rhythm, chronic LBBB.   Assessment/Plan   1. Acute encephalopathy  - Presents with one day of confusion, has mild dysarthria but no acute focal neurologic deficit detected, no acute findings on CT head, and no significant hypercarbia on VBG  - Check UA, TSH, ammonia, B12, and RPR  - Continue supportive care, consider MRI brain if pending studies unrevealing    2. CAD  - No anginal complaints  - Continue ASA, statin, beta-blocker   3. Chronic systolic CHF  - Appears compensated  - Continue Lasix, Entresto, beta-blocker, daily wt    4. Insulin-dependent DM  - A1c was 8.7% in December 2020  - Hold metformin and Soliqua, use SSI with Novolog for now    5. CKD stage III  - SCr is 1.60 on admission, consistent with his apparent baseline  - Renally-dose medications, monitor    6. Paroxysmal atrial fibrillation  - In sinus rhythm in ED  - CHADS-VASc is 49 (age x2, CAD, CHF, DM) - Continue warfarin, metoprolol    7. Epilepsy  - Continue Keppra and Neurontin   8. OSA; chronic hypoxic respiratory failure  - Continue supplemental O2, CPAP at bedtime -    DVT prophylaxis: warfarin  Code Status: Full  Family Communication: Discussed with patient  Consults called: None  Admission status: Observation    Briscoe Deutscher, MD [facility] Pager [phone]  If 7PM-7AM, please contact  night-coverage [email or web address] Password [facility]  04/11/2019, 12:56 AM

## 2019-04-11 NOTE — Progress Notes (Signed)
ANTICOAGULATION CONSULT NOTE - Pharmacy Consult for Coumadin Indication: atrial fibrillation  Allergies  Allergen Reactions  . Codeine Other (See Comments)    constipation  . Erythromycin-Sulfisoxazole Other (See Comments)    Causes infection in throat and eyes    Patient Measurements: Height: 5\' 8"  (172.7 cm) Weight: 280 lb 13.9 oz (127.4 kg) IBW/kg (Calculated) : 68.4  Vital Signs: Temp: 98.4 F (36.9 C) (12/23 0630) BP: 128/48 (12/23 0630) Pulse Rate: 100 (12/23 0630)  Labs: Recent Labs    04/10/19 1954 04/11/19 0512  HGB 11.0* 10.0*  HCT 36.8* 33.3*  PLT 159 153  LABPROT 25.1* 28.6*  INR 2.3* 2.7*  CREATININE 1.60* 1.57*  CKTOTAL 74  --     Estimated Creatinine Clearance: 50.5 mL/min (A) (by C-G formula based on SCr of 1.57 mg/dL (H)).   Medical History: Past Medical History:  Diagnosis Date  . CAD S/P percutaneous coronary angioplasty    remote PCIs in 1990's- last CFX PCI 2008  . Chronic kidney disease, stage III (moderate)   . Diabetes mellitus with complication (HCC)    with hyperglycemia and diabetic autonomic poly neuropathy  . Gastro-esophageal reflux disease with esophagitis   . Hyperlipidemia   . Hypertension   . Ischemic cardiomyopathy 01/20/2018   St Jude ICD   . Morbid obesity ([place])   . Obstructive sleep apnea    on CPAP  . Polyneuropathy   . Primary insomnia     Medications:  See electronic med rec  Assessment: 78 y.o. M presents with confusion. Pt on coumadin PTA for afib. Admission INR 2.3 (therapeutic). CBC ok on admission Home dose: 2.5mg  daily except for 5mg  on Tues/Thur/Sat INR 2.3>>2.7  Goal of Therapy:  INR 2-3 Monitor platelets by anticoagulation protocol: Yes   Plan:  Coumadin 2.5mg  today Daily INR Monitor for S/S of bleeding  Isac Sarna, BS Vena [place], BCPS Clinical Pharmacist Pager [phone] 04/11/2019,11:29 AM

## 2019-04-11 NOTE — Progress Notes (Signed)
PROGRESS NOTE    Noah [name]  UUE:[id number] DOB: 03/22/1941 DOA: 04/10/2019 PCP: Estanislado Pandy, MD    Brief Narrative:  78 year old male with a history of diabetes, hypertension, obstructive sleep apnea, chronic kidney disease stage III, seizure disorder, admitted to the hospital with 1 day of confusion.  Basic work-up including CT head, blood cultures were found to be unrevealing.  He remains confused.  Further work-up in process.   Assessment & Plan:   Principal Problem:   Acute encephalopathy Active Problems:   DM (diabetes mellitus), type 2 with complications (HCC)   Chronic systolic (congestive) heart failure (HCC)   CAD S/P percutaneous coronary angioplasty   Atrial fibrillation (HCC)   CKD (chronic kidney disease) stage 3, GFR 30-59 ml/min   OSA (obstructive sleep apnea)   Epilepsy (HCC)   1. Acute encephalopathy.  He is noted to have mild dysarthria, but no acute focal neurologic deficits.  No significant hypercarbia on blood gas.  Urinalysis, TSH, ammonia, B12 and RPR unrevealing.  Will check MRI brain.  We will also check EEG. 2. Coronary artery disease.  No complaints of chest pain.  Continue aspirin, statin and beta-blocker 3. Chronic systolic CHF.  Continue Lasix, Entresto, beta-blocker. 4. Insulin dependent diabetes.  A1c was 8.7 earlier this month.  Continue on sliding scale for now. 5. Chronic kidney disease stage III.  Creatinine 1.6 on admission which is consistent with his baseline.  Continue to monitor. 6. Paroxysmal atrial fibrillation.  Rate controlled with metoprolol.  Anticoagulated with Coumadin. 7. Epilepsy.  Continue on Keppra and Neurontin. 8. Obstructive sleep apnea.  Continue supplemental O2 at night.  Continue CPAP.   DVT prophylaxis: Warfarin Code Status: Full code Family Communication: None present Disposition Plan: Discharge home   Consultants:     Procedures:     Antimicrobials:       Subjective: Patient is confused, does  not know that he is in the hospital.  Objective: Vitals:   04/11/19 0251 04/11/19 0630 04/11/19 1306 04/11/19 1953  BP: (!) 100/42 (!) 128/48 (!) 113/50   Pulse:  100 (!) 104   Resp:  17 18   Temp:  98.4 F (36.9 C) 98.6 F (37 C)   TempSrc:   Oral   SpO2:  96% 91% 92%  Weight:      Height:        Intake/Output Summary (Last 24 hours) at 04/11/2019 2034 Last data filed at 04/11/2019 1700 Gross per 24 hour  Intake 720 ml  Output -  Net 720 ml   Filed Weights   04/10/19 1912 04/11/19 0237  Weight: 129.7 kg 127.4 kg    Examination:  General exam: Appears calm and comfortable, confused, obese Respiratory system: Crackles at bases, mild wheeze. Respiratory effort normal. Cardiovascular system: S1 & S2 heard, RRR. No JVD, murmurs, rubs, gallops or clicks.  1-2+ edema bilaterally Gastrointestinal system: Abdomen is nondistended, soft and nontender. No organomegaly or masses felt. Normal bowel sounds heard. Central nervous system:  No focal neurological deficits. Extremities: Symmetric 5 x 5 power. Skin: No rashes, lesions or ulcers Psychiatry: Confused    Data Reviewed: I have personally reviewed following labs and imaging studies  CBC: Recent Labs  Lab 04/10/19 1954 04/11/19 0512  WBC 8.7 9.8  HGB 11.0* 10.0*  HCT 36.8* 33.3*  MCV 92.7 91.7  PLT 159 153   Basic Metabolic Panel: Recent Labs  Lab 04/10/19 1954 04/11/19 0512  NA 143 143  K 4.9 4.7  CL 105  103  CO2 29 28  GLUCOSE 118* 92  BUN 42* 41*  CREATININE 1.60* 1.57*  CALCIUM 9.3 9.2   GFR: Estimated Creatinine Clearance: 50.5 mL/min (A) (by C-G formula based on SCr of 1.57 mg/dL (H)). Liver Function Tests: Recent Labs  Lab 04/10/19 1954 04/11/19 0512  AST 15 14*  ALT 15 13  ALKPHOS 68 61  BILITOT 0.6 0.8  PROT 6.9 6.2*  ALBUMIN 3.3* 2.8*   No results for input(s): LIPASE, AMYLASE in the last 168 hours. Recent Labs  Lab 04/10/19 1954  AMMONIA 34   Coagulation Profile: Recent Labs   Lab 04/10/19 1954 04/11/19 0512  INR 2.3* 2.7*   Cardiac Enzymes: Recent Labs  Lab 04/10/19 1954  CKTOTAL 74   BNP (last 3 results) No results for input(s): PROBNP in the last 8760 hours. HbA1C: No results for input(s): HGBA1C in the last 72 hours. CBG: Recent Labs  Lab 04/11/19 0713 04/11/19 1057 04/11/19 1611  GLUCAP 84 101* 80   Lipid Profile: No results for input(s): CHOL, HDL, LDLCALC, TRIG, CHOLHDL, LDLDIRECT in the last 72 hours. Thyroid Function Tests: Recent Labs    04/10/19 1954  TSH 3.689   Anemia Panel: Recent Labs    04/10/19 1954  VITAMINB12 407   Sepsis Labs: Recent Labs  Lab 04/10/19 1954  LATICACIDVEN 0.8    Recent Results (from the past 240 hour(s))  SARS CORONAVIRUS 2 (TAT 6-24 HRS) Nasopharyngeal Nasopharyngeal Swab     Status: None   Collection Time: 04/10/19 10:20 PM   Specimen: Nasopharyngeal Swab  Result Value Ref Range Status   SARS Coronavirus 2 NEGATIVE NEGATIVE Final    Comment: (NOTE) SARS-CoV-2 target nucleic acids are NOT DETECTED. The SARS-CoV-2 RNA is generally detectable in upper and lower respiratory specimens during the acute phase of infection. Negative results do not preclude SARS-CoV-2 infection, do not rule out co-infections with other pathogens, and should not be used as the sole basis for treatment or other patient management decisions. Negative results must be combined with clinical observations, patient history, and epidemiological information. The expected result is Negative. Fact Sheet for Patients: HairSlick.no Fact Sheet for Healthcare Providers: quierodirigir.com This test is not yet approved or cleared by the [place] FDA and  has been authorized for detection and/or diagnosis of SARS-CoV-2 by FDA under an Emergency Use Authorization (EUA). This EUA will remain  in effect (meaning this test can be used) for the duration of the COVID-19  declaration under Section 56 4(b)(1) of the Act, 21 U.S.C. section 360bbb-3(b)(1), unless the authorization is terminated or revoked sooner. Performed at [facility], [address], [place], [place] [address]          Radiology Studies: CT Head  Result Date: 04/10/2019 CLINICAL DATA:  Encephalopathy. History of intracranial hemorrhage. EXAM: CT HEAD WITHOUT CONTRAST TECHNIQUE: Contiguous axial images were obtained from the base of the skull through the vertex without intravenous contrast. COMPARISON:  Head CT 10/24/2018 FINDINGS: Brain: There is no mass, hemorrhage or extra-axial collection. The size and configuration of the ventricles and extra-axial CSF spaces are normal. There is hypoattenuation of the white matter, most commonly indicating chronic small vessel disease. Vascular: Atherosclerotic calcification of the vertebral and internal carotid arteries at the skull base. No abnormal hyperdensity of the major intracranial arteries or dural venous sinuses. Skull: The visualized skull base, calvarium and extracranial soft tissues are normal. Sinuses/Orbits: No fluid levels or advanced mucosal thickening of the visualized paranasal sinuses. No mastoid or  middle ear effusion. The orbits are normal. 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 Electronically Signed   By: Ulyses Jarred M.D.   On: 04/10/2019 20:16   XR Chest Single View  Result Date: 04/10/2019 CLINICAL DATA:  Altered mental status. 3 L home O2 EXAM: PORTABLE CHEST 1 VIEW COMPARISON:  03/28/2019 FINDINGS: Cardiomediastinal contours are stable. Lung volumes remain low. There is a multi lead pacer defibrillator with power pack over left chest as before. Azygos fissure again noted. Lungs are clear. No acute bone finding. IMPRESSION: Stable exam. No active cardiopulmonary disease. Electronically Signed   By: Zetta Bills M.D.   On: 04/10/2019 20:14        Scheduled Meds: . aspirin  EC  81 mg Oral Daily  . atorvastatin  40 mg Oral q1800  . famotidine  20 mg Oral QHS  . finasteride  5 mg Oral QPM  . FLUoxetine  20 mg Oral QPM  . furosemide  40 mg Oral q1800  . furosemide  80 mg Oral Q24H  . gabapentin  100 mg Oral Daily  . gabapentin  200 mg Oral QHS  . insulin aspart  0-9 Units Subcutaneous TID WC  . levETIRAcetam  750 mg Oral BID  . metoprolol succinate  100 mg Oral Daily  . nystatin   Topical BID  . pantoprazole  40 mg Oral Daily  . sacubitril-valsartan  1 tablet Oral BID  . sodium chloride flush  3 mL Intravenous Q12H  . sodium chloride flush  3 mL Intravenous Q12H  . tamsulosin  0.8 mg Oral QPM  . Warfarin - Pharmacist Dosing Inpatient   Does not apply q1800   Continuous Infusions: . sodium chloride       LOS: 0 days    Time spent: 22mins    Kathie Dike, MD [facility]   If 7PM-7AM, please contact night-coverage [email or web address]  04/11/2019, 8:34 PM

## 2019-04-11 NOTE — Progress Notes (Signed)
Patient arrived to floor, only a&O x 1.  Patient takes a while to reorient and to understand why I hospital.  Patient placed near desk.  Will continue to monitor patient.

## 2019-04-11 NOTE — Progress Notes (Signed)
ANTICOAGULATION CONSULT NOTE - Initial Consult  Pharmacy Consult for Coumadin Indication: atrial fibrillation  Allergies  Allergen Reactions  . Codeine Other (See Comments)    constipation  . Erythromycin-Sulfisoxazole Other (See Comments)    Causes infection in throat and eyes    Patient Measurements: Height: 5\' 8"  (172.7 cm) Weight: 286 lb (129.7 kg) IBW/kg (Calculated) : 68.4  Vital Signs: Temp: 97.8 F (36.6 C) (12/22 1918) Temp Source: Oral (12/22 1918) BP: 133/69 (12/23 0000) Pulse Rate: 98 (12/23 0000)  Labs: Recent Labs    04/10/19 1954  HGB 11.0*  HCT 36.8*  PLT 159  LABPROT 25.1*  INR 2.3*  CREATININE 1.60*  CKTOTAL 74    Estimated Creatinine Clearance: 50 mL/min (A) (by C-G formula based on SCr of 1.6 mg/dL (H)).   Medical History: Past Medical History:  Diagnosis Date  . CAD S/P percutaneous coronary angioplasty    remote PCIs in 1990's- last CFX PCI 2008  . Chronic kidney disease, stage III (moderate)   . Diabetes mellitus with complication (HCC)    with hyperglycemia and diabetic autonomic poly neuropathy  . Gastro-esophageal reflux disease with esophagitis   . Hyperlipidemia   . Hypertension   . Ischemic cardiomyopathy 01/20/2018   St Jude ICD   . Morbid obesity ([place])   . Obstructive sleep apnea    on CPAP  . Polyneuropathy   . Primary insomnia     Medications:  See electronic med rec  Assessment: 78 y.o. M presents with confusion. Pt on coumadin PTA for afib. Admission INR 2.3 (therapeutic). CBC ok on admission Home dose: 2.5mg  daily except for 5mg  on Tues/Thur/Sat  Goal of Therapy:  INR 2-3 Monitor platelets by anticoagulation protocol: Yes   Plan:  Daily INR Continue home dose of 2.5mg  daily except for 5mg  on Tues/Thur/Sat  Nihar Klus G Iktan Aikman 04/11/2019,1:19 AM

## 2019-04-12 ENCOUNTER — Observation Stay (HOSPITAL_COMMUNITY)
Admit: 2019-04-12 | Discharge: 2019-04-12 | Disposition: A | Discharge status: Home / Self Care | Payer: Medicare HMO | Attending: Internal Medicine | Admitting: Internal Medicine

## 2019-04-12 ENCOUNTER — Observation Stay (HOSPITAL_COMMUNITY): Payer: Medicare HMO

## 2019-04-12 ENCOUNTER — Inpatient Hospital Stay (HOSPITAL_COMMUNITY): Payer: Medicare HMO

## 2019-04-12 DIAGNOSIS — E1165 Type 2 diabetes mellitus with hyperglycemia: Secondary | ICD-10-CM | POA: Diagnosis not present

## 2019-04-12 DIAGNOSIS — R0602 Shortness of breath: Secondary | ICD-10-CM | POA: Diagnosis not present

## 2019-04-12 DIAGNOSIS — I259 Chronic ischemic heart disease, unspecified: Secondary | ICD-10-CM | POA: Diagnosis not present

## 2019-04-12 DIAGNOSIS — G40909 Epilepsy, unspecified, not intractable, without status epilepticus: Secondary | ICD-10-CM | POA: Diagnosis not present

## 2019-04-12 DIAGNOSIS — I482 Chronic atrial fibrillation, unspecified: Secondary | ICD-10-CM | POA: Diagnosis not present

## 2019-04-12 DIAGNOSIS — F5101 Primary insomnia: Secondary | ICD-10-CM | POA: Diagnosis present

## 2019-04-12 DIAGNOSIS — T465X5A Adverse effect of other antihypertensive drugs, initial encounter: Secondary | ICD-10-CM | POA: Diagnosis not present

## 2019-04-12 DIAGNOSIS — Z803 Family history of malignant neoplasm of breast: Secondary | ICD-10-CM | POA: Diagnosis not present

## 2019-04-12 DIAGNOSIS — Z87891 Personal history of nicotine dependence: Secondary | ICD-10-CM | POA: Diagnosis not present

## 2019-04-12 DIAGNOSIS — I959 Hypotension, unspecified: Secondary | ICD-10-CM | POA: Diagnosis not present

## 2019-04-12 DIAGNOSIS — I130 Hypertensive heart and chronic kidney disease with heart failure and stage 1 through stage 4 chronic kidney disease, or unspecified chronic kidney disease: Secondary | ICD-10-CM | POA: Diagnosis not present

## 2019-04-12 DIAGNOSIS — E1122 Type 2 diabetes mellitus with diabetic chronic kidney disease: Secondary | ICD-10-CM | POA: Diagnosis not present

## 2019-04-12 DIAGNOSIS — E785 Hyperlipidemia, unspecified: Secondary | ICD-10-CM | POA: Diagnosis present

## 2019-04-12 DIAGNOSIS — E1151 Type 2 diabetes mellitus with diabetic peripheral angiopathy without gangrene: Secondary | ICD-10-CM | POA: Diagnosis present

## 2019-04-12 DIAGNOSIS — N400 Enlarged prostate without lower urinary tract symptoms: Secondary | ICD-10-CM | POA: Diagnosis not present

## 2019-04-12 DIAGNOSIS — I480 Paroxysmal atrial fibrillation: Secondary | ICD-10-CM | POA: Diagnosis not present

## 2019-04-12 DIAGNOSIS — J9610 Chronic respiratory failure, unspecified whether with hypoxia or hypercapnia: Secondary | ICD-10-CM | POA: Diagnosis not present

## 2019-04-12 DIAGNOSIS — I252 Old myocardial infarction: Secondary | ICD-10-CM | POA: Diagnosis not present

## 2019-04-12 DIAGNOSIS — Z20822 Contact with and (suspected) exposure to covid-19: Secondary | ICD-10-CM | POA: Diagnosis not present

## 2019-04-12 DIAGNOSIS — Z7401 Bed confinement status: Secondary | ICD-10-CM | POA: Diagnosis not present

## 2019-04-12 DIAGNOSIS — N183 Chronic kidney disease, stage 3 unspecified: Secondary | ICD-10-CM | POA: Diagnosis not present

## 2019-04-12 DIAGNOSIS — I5022 Chronic systolic (congestive) heart failure: Secondary | ICD-10-CM | POA: Diagnosis not present

## 2019-04-12 DIAGNOSIS — N170 Acute kidney failure with tubular necrosis: Secondary | ICD-10-CM | POA: Diagnosis not present

## 2019-04-12 DIAGNOSIS — Z9861 Coronary angioplasty status: Secondary | ICD-10-CM | POA: Diagnosis not present

## 2019-04-12 DIAGNOSIS — Z950 Presence of cardiac pacemaker: Secondary | ICD-10-CM | POA: Diagnosis not present

## 2019-04-12 DIAGNOSIS — G40209 Localization-related (focal) (partial) symptomatic epilepsy and epileptic syndromes with complex partial seizures, not intractable, without status epilepticus: Secondary | ICD-10-CM | POA: Diagnosis not present

## 2019-04-12 DIAGNOSIS — I2510 Atherosclerotic heart disease of native coronary artery without angina pectoris: Secondary | ICD-10-CM | POA: Diagnosis present

## 2019-04-12 DIAGNOSIS — N179 Acute kidney failure, unspecified: Secondary | ICD-10-CM | POA: Diagnosis not present

## 2019-04-12 DIAGNOSIS — J9601 Acute respiratory failure with hypoxia: Secondary | ICD-10-CM | POA: Diagnosis not present

## 2019-04-12 DIAGNOSIS — E869 Volume depletion, unspecified: Secondary | ICD-10-CM | POA: Diagnosis not present

## 2019-04-12 DIAGNOSIS — I255 Ischemic cardiomyopathy: Secondary | ICD-10-CM | POA: Diagnosis present

## 2019-04-12 DIAGNOSIS — R69 Illness, unspecified: Secondary | ICD-10-CM | POA: Diagnosis not present

## 2019-04-12 DIAGNOSIS — G4733 Obstructive sleep apnea (adult) (pediatric): Secondary | ICD-10-CM | POA: Diagnosis not present

## 2019-04-12 DIAGNOSIS — R29898 Other symptoms and signs involving the musculoskeletal system: Secondary | ICD-10-CM | POA: Diagnosis not present

## 2019-04-12 DIAGNOSIS — I447 Left bundle-branch block, unspecified: Secondary | ICD-10-CM | POA: Diagnosis present

## 2019-04-12 DIAGNOSIS — Z95810 Presence of automatic (implantable) cardiac defibrillator: Secondary | ICD-10-CM | POA: Diagnosis not present

## 2019-04-12 DIAGNOSIS — D649 Anemia, unspecified: Secondary | ICD-10-CM | POA: Diagnosis not present

## 2019-04-12 DIAGNOSIS — N1832 Chronic kidney disease, stage 3b: Secondary | ICD-10-CM | POA: Diagnosis not present

## 2019-04-12 DIAGNOSIS — I5020 Unspecified systolic (congestive) heart failure: Secondary | ICD-10-CM | POA: Diagnosis not present

## 2019-04-12 DIAGNOSIS — Z8249 Family history of ischemic heart disease and other diseases of the circulatory system: Secondary | ICD-10-CM | POA: Diagnosis not present

## 2019-04-12 DIAGNOSIS — J9611 Chronic respiratory failure with hypoxia: Secondary | ICD-10-CM | POA: Diagnosis not present

## 2019-04-12 DIAGNOSIS — K2100 Gastro-esophageal reflux disease with esophagitis, without bleeding: Secondary | ICD-10-CM | POA: Diagnosis present

## 2019-04-12 DIAGNOSIS — R4182 Altered mental status, unspecified: Secondary | ICD-10-CM | POA: Diagnosis not present

## 2019-04-12 DIAGNOSIS — J189 Pneumonia, unspecified organism: Secondary | ICD-10-CM | POA: Diagnosis not present

## 2019-04-12 DIAGNOSIS — G9340 Encephalopathy, unspecified: Secondary | ICD-10-CM | POA: Diagnosis not present

## 2019-04-12 LAB — BLOOD GAS, ARTERIAL
Acid-Base Excess: 2.2 mmol/L — ABNORMAL HIGH (ref 0.0–2.0)
Bicarbonate: 26.4 mmol/L (ref 20.0–28.0)
Drawn by: 41977
FIO2: 28
O2 Saturation: 95.9 %
Patient temperature: 37
pCO2 arterial: 40.1 mmHg (ref 32.0–48.0)
pH, Arterial: 7.43 (ref 7.350–7.450)
pO2, Arterial: 87.3 mmHg (ref 83.0–108.0)

## 2019-04-12 LAB — GLUCOSE, CAPILLARY
Glucose-Capillary: 101 mg/dL — ABNORMAL HIGH (ref 70–99)
Glucose-Capillary: 97 mg/dL (ref 70–99)
Glucose-Capillary: 103 mg/dL — ABNORMAL HIGH (ref 70–99)

## 2019-04-12 LAB — BASIC METABOLIC PANEL
Anion gap: 13 (ref 5–15)
BUN: 42 mg/dL — ABNORMAL HIGH (ref 8–23)
CO2: 28 mmol/L (ref 22–32)
Calcium: 9.4 mg/dL (ref 8.9–10.3)
Chloride: 105 mmol/L (ref 98–111)
Creatinine, Ser: 1.8 mg/dL — ABNORMAL HIGH (ref 0.61–1.24)
GFR calc Af Amer: 41 mL/min — ABNORMAL LOW (ref >60)
GFR calc non Af Amer: 35 mL/min — ABNORMAL LOW (ref >60)
Glucose, Bld: 111 mg/dL — ABNORMAL HIGH (ref 70–99)
Potassium: 4.5 mmol/L (ref 3.5–5.1)
Sodium: 146 mmol/L — ABNORMAL HIGH (ref 135–145)

## 2019-04-12 LAB — CBC
HCT: 35.2 % — ABNORMAL LOW (ref 39.0–52.0)
Hemoglobin: 10.4 g/dL — ABNORMAL LOW (ref 13.0–17.0)
MCH: 28.1 pg (ref 26.0–34.0)
MCHC: 29.5 g/dL — ABNORMAL LOW (ref 30.0–36.0)
MCV: 95.1 fL (ref 80.0–100.0)
Platelets: 187 10*3/uL (ref 150–400)
RBC: 3.7 MIL/uL — ABNORMAL LOW (ref 4.22–5.81)
RDW: 13.3 % (ref 11.5–15.5)
WBC: 8.2 10*3/uL (ref 4.0–10.5)
nRBC: 0 % (ref 0.0–0.2)

## 2019-04-12 LAB — URINE CULTURE: Culture: <10000 — AB

## 2019-04-12 LAB — PROTIME-INR
INR: 2.1 — ABNORMAL HIGH (ref 0.8–1.2)
Prothrombin Time: 23.7 seconds — ABNORMAL HIGH (ref 11.4–15.2)

## 2019-04-12 MED ORDER — WARFARIN SODIUM 5 MG PO TABS
5.0000 mg | ORAL_TABLET | Freq: Once | ORAL | Status: AC
  Administered 2019-04-12: 5 mg via ORAL
  Filled 2019-04-12: qty 1

## 2019-04-12 NOTE — Progress Notes (Signed)
PROGRESS NOTE    Noah [name]  TML:[id number] DOB: Oct 03, 1940 DOA: 04/10/2019 PCP: Manon Hilding, MD    Brief Narrative:  78 year old male with a history of diabetes, hypertension, obstructive sleep apnea, chronic kidney disease stage III, seizure disorder, admitted to the hospital with 1 day of confusion.  Basic work-up including CT head, blood cultures were found to be unrevealing.  He remains confused.  Further work-up in process.   Assessment & Plan:   Principal Problem:   Acute encephalopathy Active Problems:   DM (diabetes mellitus), type 2 with complications (HCC)   Chronic systolic (congestive) heart failure (HCC)   CAD S/P percutaneous coronary angioplasty   Atrial fibrillation (HCC)   CKD (chronic kidney disease) stage 3, GFR 30-59 ml/min   OSA (obstructive sleep apnea)   Epilepsy ([place])   1. Acute encephalopathy.  He is noted to have mild dysarthria, but no acute focal neurologic deficits.  No significant hypercarbia on blood gas.  Urinalysis, TSH, ammonia, B12 and RPR unrevealing.  Cannot perform MRI due to presence of pacemaker.  EEG did not show any epileptiform discharges.  Repeat ABG and chest x-ray.  Mental status is mildly better today.  Wife does not report any recent medication changes and it is unlikely that he took any extra medications. 2. Coronary artery disease.  No complaints of chest pain.  Continue aspirin, statin and beta-blocker 3. Chronic systolic CHF.  Continue Lasix, Entresto, beta-blocker.  Still has some evidence of lower extremity edema.  Recheck chest x-ray. 4. Insulin dependent diabetes.  A1c was 8.7 earlier this month.  Continue on sliding scale for now. 5. Chronic kidney disease stage III.  Creatinine 1.6 on admission which is consistent with his baseline.  Continue to monitor. 6. Paroxysmal atrial fibrillation.  Rate controlled with metoprolol.  Anticoagulated with Coumadin. 7. Epilepsy.  Continue on Keppra and Neurontin. 8. Obstructive  sleep apnea.  Continue supplemental O2 at night.  Continue CPAP.   DVT prophylaxis: Warfarin Code Status: Full code Family Communication: Discussed with wife Disposition Plan: Discharge home when mental status has improved   Consultants:     Procedures:     Antimicrobials:       Subjective: Patient knows that he is in the hospital, knows the year and the month, becomes confused when asked deeper questions.  Objective: Vitals:   04/11/19 2227 04/12/19 0438 04/12/19 0943 04/12/19 1335  BP:  113/60 (!) 118/51 (!) 125/59  Pulse:  67 73 71  Resp:  18  20  Temp:  98.2 F (36.8 C)  98.2 F (36.8 C)  TempSrc:      SpO2: 96% 99%  96%  Weight:  125.2 kg    Height:        Intake/Output Summary (Last 24 hours) at 04/12/2019 1942 Last data filed at 04/12/2019 1818 Gross per 24 hour  Intake 600 ml  Output 300 ml  Net 300 ml   Filed Weights   04/10/19 1912 04/11/19 0237 04/12/19 0438  Weight: 129.7 kg 127.4 kg 125.2 kg    Examination:  General exam: Alert, awake, no distress Respiratory system: Crackles at bases. Respiratory effort normal. Cardiovascular system:RRR. No murmurs, rubs, gallops. Gastrointestinal system: Abdomen is nondistended, soft and nontender. No organomegaly or masses felt. Normal bowel sounds heard. Central nervous system: Alert and oriented. No focal neurological deficits. Extremities: 1+ edema bilaterally Skin: No rashes, lesions or ulcers Psychiatry: Still remains confused, mildly better than yesterday     Data Reviewed: I have personally  reviewed following labs and imaging studies  CBC: Recent Labs  Lab 04/10/19 1954 04/11/19 0512 04/12/19 0530  WBC 8.7 9.8 8.2  HGB 11.0* 10.0* 10.4*  HCT 36.8* 33.3* 35.2*  MCV 92.7 91.7 95.1  PLT 159 153 187   Basic Metabolic Panel: Recent Labs  Lab 04/10/19 1954 04/11/19 0512 04/12/19 0530  NA 143 143 146*  K 4.9 4.7 4.5  CL 105 103 105  CO2 29 28 28   GLUCOSE 118* 92 111*  BUN 42*  41* 42*  CREATININE 1.60* 1.57* 1.80*  CALCIUM 9.3 9.2 9.4   GFR: Estimated Creatinine Clearance: 43.6 mL/min (A) (by C-G formula based on SCr of 1.8 mg/dL (H)). Liver Function Tests: Recent Labs  Lab 04/10/19 1954 04/11/19 0512  AST 15 14*  ALT 15 13  ALKPHOS 68 61  BILITOT 0.6 0.8  PROT 6.9 6.2*  ALBUMIN 3.3* 2.8*   No results for input(s): LIPASE, AMYLASE in the last 168 hours. Recent Labs  Lab 04/10/19 1954  AMMONIA 34   Coagulation Profile: Recent Labs  Lab 04/10/19 1954 04/11/19 0512 04/12/19 0530  INR 2.3* 2.7* 2.1*   Cardiac Enzymes: Recent Labs  Lab 04/10/19 1954  CKTOTAL 74   BNP (last 3 results) No results for input(s): PROBNP in the last 8760 hours. HbA1C: No results for input(s): HGBA1C in the last 72 hours. CBG: Recent Labs  Lab 04/11/19 1611 04/11/19 2121 04/12/19 0726 04/12/19 1123 04/12/19 1616  GLUCAP 80 119* 101* 97 103*   Lipid Profile: No results for input(s): CHOL, HDL, LDLCALC, TRIG, CHOLHDL, LDLDIRECT in the last 72 hours. Thyroid Function Tests: Recent Labs    04/10/19 1954  TSH 3.689   Anemia Panel: Recent Labs    04/10/19 1954  VITAMINB12 407   Sepsis Labs: Recent Labs  Lab 04/10/19 1954  LATICACIDVEN 0.8    Recent Results (from the past 240 hour(s))  SARS CORONAVIRUS 2 (TAT 6-24 HRS) Nasopharyngeal Nasopharyngeal Swab     Status: None   Collection Time: 04/10/19 10:20 PM   Specimen: Nasopharyngeal Swab  Result Value Ref Range Status   SARS Coronavirus 2 NEGATIVE NEGATIVE Final    Comment: (NOTE) SARS-CoV-2 target nucleic acids are NOT DETECTED. The SARS-CoV-2 RNA is generally detectable in upper and lower respiratory specimens during the acute phase of infection. Negative results do not preclude SARS-CoV-2 infection, do not rule out co-infections with other pathogens, and should not be used as the sole basis for treatment or other patient management decisions. Negative results must be combined with  clinical observations, patient history, and epidemiological information. The expected result is Negative. Fact Sheet for Patients: HairSlick.nohttps://www.fda.gov/media/138098/download Fact Sheet for Healthcare Providers: quierodirigir.comhttps://www.fda.gov/media/138095/download This test is not yet approved or cleared by the [place] FDA and  has been authorized for detection and/or diagnosis of SARS-CoV-2 by FDA under an Emergency Use Authorization (EUA). This EUA will remain  in effect (meaning this test can be used) for the duration of the COVID-19 declaration under Section 56 4(b)(1) of the Act, 21 U.S.C. section 360bbb-3(b)(1), unless the authorization is terminated or revoked sooner. Performed at [facility], [address] [place], [place] [address]   Urine culture     Status: Abnormal   Collection Time: 04/11/19 12:20 AM   Specimen: Urine, Random  Result Value Ref Range Status   Specimen Description   Final    URINE, RANDOM Performed at [facility], [address] [place], [place] [address]    Special Requests  Final    NONE Performed at [facility], [address], [place], [place] [address]    Culture (A)  Final    <10,000 COLONIES/mL INSIGNIFICANT GROWTH Performed at [facility], [address], [place], [place] [address]    Report Status 04/12/2019 FINAL  Final         Radiology Studies: EEG  Result Date: 04/12/2019 Charlsie Quest, MD     04/12/2019 11:20 AM Patient Name: TAYVIEN KANE MRN: [id number] Epilepsy Attending: Charlsie Quest Referring Physician/Provider: Dr Erick Blinks Date: 04/12/2019 Duration: 22.54 minutes Patient history: 78 year old old male with history of epilepsy now presented with altered mental status.  EEG to evaluate for seizures. Level of alertness: Awake AEDs during EEG study: Keppra, gabapentin Technical aspects: This EEG study was done with scalp electrodes positioned according to the 10-20 International system of electrode  placement. Electrical activity was acquired at a sampling rate of 500Hz  and reviewed with a high frequency filter of 70Hz  and a low frequency filter of 1Hz . EEG data were recorded continuously and digitally stored. Description: During awake state, no clear posterior dominant rhythm was seen.  EEG showed continuous generalized 3 to 6 Hz theta-delta slowing. Hyperventilation and photic stimulation were not performed. Abnormality -Continuous slow, generalized IMPRESSION: This study is suggestive of mild to moderate diffuse encephalopathy, nonspecific to etiology.  No seizures or epileptiform discharges were seen throughout the recording.   CT Head  Result Date: 04/10/2019 CLINICAL DATA:  Encephalopathy. History of intracranial hemorrhage. EXAM: CT HEAD WITHOUT CONTRAST TECHNIQUE: Contiguous axial images were obtained from the base of the skull through the vertex without intravenous contrast. COMPARISON:  Head CT 10/24/2018 FINDINGS: Brain: There is no mass, hemorrhage or extra-axial collection. The size and configuration of the ventricles and extra-axial CSF spaces are normal. There is hypoattenuation of the white matter, most commonly indicating chronic small vessel disease. Vascular: Atherosclerotic calcification of the vertebral and internal carotid arteries at the skull base. No abnormal hyperdensity of the major intracranial arteries or dural venous sinuses. Skull: The visualized skull base, calvarium and extracranial soft tissues are normal. Sinuses/Orbits: No fluid levels or advanced mucosal thickening of the visualized paranasal sinuses. No mastoid or middle ear effusion. The orbits are normal. 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 Electronically Signed   By: Charlsie Quest M.D.   On: 04/10/2019 20:16   XR Chest Single View  Result Date: 04/10/2019 CLINICAL DATA:  Altered mental status. 3 L home O2 EXAM: PORTABLE CHEST 1 VIEW COMPARISON:   03/28/2019 FINDINGS: Cardiomediastinal contours are stable. Lung volumes remain low. There is a multi lead pacer defibrillator with power pack over left chest as before. Azygos fissure again noted. Lungs are clear. No acute bone finding. IMPRESSION: Stable exam. No active cardiopulmonary disease. Electronically Signed   By: 04/12/2019 M.D.   On: 04/10/2019 20:14        Scheduled Meds: . aspirin EC  81 mg Oral Daily  . atorvastatin  40 mg Oral q1800  . famotidine  20 mg Oral QHS  . finasteride  5 mg Oral QPM  . FLUoxetine  20 mg Oral QPM  . furosemide  40 mg Oral q1800  . furosemide  80 mg Oral Q24H  . gabapentin  100 mg Oral Daily  . gabapentin  200 mg Oral QHS  . insulin aspart  0-9 Units Subcutaneous TID WC  . levETIRAcetam  750 mg Oral BID  . metoprolol succinate  100 mg Oral Daily  . nystatin   Topical BID  . pantoprazole  40 mg Oral Daily  . sacubitril-valsartan  1 tablet Oral BID  . sodium chloride flush  3 mL Intravenous Q12H  . sodium chloride flush  3 mL Intravenous Q12H  . tamsulosin  0.8 mg Oral QPM  . Warfarin - Pharmacist Dosing Inpatient   Does not apply q1800   Continuous Infusions: . sodium chloride       LOS: 0 days    Time spent:    Erick Blinks, MD [facility]   If 7PM-7AM, please contact night-coverage [email or web address]  04/12/2019, 7:42 PM

## 2019-04-12 NOTE — Procedures (Signed)
Patient Name: Noah [name]  MRN: [id number]  Epilepsy Attending: Lora Havens  Referring Physician/Provider: Dr Kathie Dike Date: 04/12/2019 Duration: 22.54 minutes  Patient history: 78 year old old male with history of epilepsy now presented with altered mental status.  EEG to evaluate for seizures.  Level of alertness: Awake  AEDs during EEG study: Keppra, gabapentin  Technical aspects: This EEG study was done with scalp electrodes positioned according to the 10-20 International system of electrode placement. Electrical activity was acquired at a sampling rate of 500Hz  and reviewed with a high frequency filter of 70Hz  and a low frequency filter of 1Hz . EEG data were recorded continuously and digitally stored.   Description: During awake state, no clear posterior dominant rhythm was seen.  EEG showed continuous generalized 3 to 6 Hz theta-delta slowing. Hyperventilation and photic stimulation were not performed.  Abnormality -Continuous slow, generalized  IMPRESSION: This study is suggestive of mild to moderate diffuse encephalopathy, nonspecific to etiology.  No seizures or epileptiform discharges were seen throughout the recording.  Staphanie Harbison Barbra Sarks

## 2019-04-12 NOTE — Progress Notes (Signed)
ANTICOAGULATION CONSULT NOTE - Pharmacy Consult for Coumadin Indication: atrial fibrillation  Allergies  Allergen Reactions  . Codeine Other (See Comments)    constipation  . Erythromycin-Sulfisoxazole Other (See Comments)    Causes infection in throat and eyes    Patient Measurements: Height: 5\' 8"  (172.7 cm) Weight: 276 lb 0.3 oz (125.2 kg) IBW/kg (Calculated) : 68.4  Vital Signs: Temp: 98.2 F (36.8 C) (12/24 0438) Temp Source: Oral (12/23 2122) BP: 113/60 (12/24 0438) Pulse Rate: 67 (12/24 0438)  Labs: Recent Labs    04/10/19 1954 04/11/19 0512 04/12/19 0530  HGB 11.0* 10.0* 10.4*  HCT 36.8* 33.3* 35.2*  PLT 159 153 187  LABPROT 25.1* 28.6* 23.7*  INR 2.3* 2.7* 2.1*  CREATININE 1.60* 1.57* 1.80*  CKTOTAL 74  --   --     Estimated Creatinine Clearance: 43.6 mL/min (A) (by C-G formula based on SCr of 1.8 mg/dL (H)).   Medical History: Past Medical History:  Diagnosis Date  . CAD S/P percutaneous coronary angioplasty    remote PCIs in 1990's- last CFX PCI 2008  . Chronic kidney disease, stage III (moderate)   . Diabetes mellitus with complication (HCC)    with hyperglycemia and diabetic autonomic poly neuropathy  . Gastro-esophageal reflux disease with esophagitis   . Hyperlipidemia   . Hypertension   . Ischemic cardiomyopathy 01/20/2018   [employer] ICD   . Morbid obesity ([place])   . Obstructive sleep apnea    on CPAP  . Polyneuropathy   . Primary insomnia     Medications:  See electronic med rec  Assessment: 78 y.o. M presents with confusion. Pt on coumadin PTA for afib. Admission INR 2.3 (therapeutic). CBC ok on admission Home dose: 2.5mg  daily except for 5mg  on Tues/Thur/Sat INR 2.1  Goal of Therapy:  INR 2-3 Monitor platelets by anticoagulation protocol: Yes   Plan:  Coumadin 5mg  today Daily INR Monitor for S/S of bleeding  Margot Ables, PharmD Clinical Pharmacist 04/12/2019 8:38 AM

## 2019-04-12 NOTE — Care Management Obs Status (Signed)
[facility] NOTIFICATION   Patient Details  Name: Noah [name] MRN: [id number] Date of Birth: 1940/05/18   Medicare Observation Status Notification Given:  Yes    Shade Flood, LCSW 04/12/2019, 9:23 AM

## 2019-04-12 NOTE — Progress Notes (Signed)
EEG complete - results pending 

## 2019-04-13 LAB — CBC
HCT: 36.5 % — ABNORMAL LOW (ref 39.0–52.0)
Hemoglobin: 10.8 g/dL — ABNORMAL LOW (ref 13.0–17.0)
MCH: 28 pg (ref 26.0–34.0)
MCHC: 29.6 g/dL — ABNORMAL LOW (ref 30.0–36.0)
MCV: 94.6 fL (ref 80.0–100.0)
Platelets: 186 10*3/uL (ref 150–400)
RBC: 3.86 MIL/uL — ABNORMAL LOW (ref 4.22–5.81)
RDW: 13.2 % (ref 11.5–15.5)
WBC: 10.3 10*3/uL (ref 4.0–10.5)
nRBC: 0 % (ref 0.0–0.2)

## 2019-04-13 LAB — BASIC METABOLIC PANEL
Anion gap: 14 (ref 5–15)
BUN: 48 mg/dL — ABNORMAL HIGH (ref 8–23)
CO2: 28 mmol/L (ref 22–32)
Calcium: 9.1 mg/dL (ref 8.9–10.3)
Chloride: 104 mmol/L (ref 98–111)
Creatinine, Ser: 2.12 mg/dL — ABNORMAL HIGH (ref 0.61–1.24)
GFR calc Af Amer: 34 mL/min — ABNORMAL LOW (ref >60)
GFR calc non Af Amer: 29 mL/min — ABNORMAL LOW (ref >60)
Glucose, Bld: 118 mg/dL — ABNORMAL HIGH (ref 70–99)
Potassium: 4.3 mmol/L (ref 3.5–5.1)
Sodium: 146 mmol/L — ABNORMAL HIGH (ref 135–145)

## 2019-04-13 LAB — GLUCOSE, CAPILLARY
Glucose-Capillary: 109 mg/dL — ABNORMAL HIGH (ref 70–99)
Glucose-Capillary: 197 mg/dL — ABNORMAL HIGH (ref 70–99)
Glucose-Capillary: 200 mg/dL — ABNORMAL HIGH (ref 70–99)
Glucose-Capillary: 253 mg/dL — ABNORMAL HIGH (ref 70–99)

## 2019-04-13 LAB — PROTIME-INR
INR: 2.6 — ABNORMAL HIGH (ref 0.8–1.2)
Prothrombin Time: 27.7 seconds — ABNORMAL HIGH (ref 11.4–15.2)

## 2019-04-13 MED ORDER — AMOXICILLIN-POT CLAVULANATE 875-125 MG PO TABS
1.0000 | ORAL_TABLET | Freq: Two times a day (BID) | ORAL | Status: DC
  Administered 2019-04-13 – 2019-04-16 (×6): 1 via ORAL
  Filled 2019-04-13 (×6): qty 1

## 2019-04-13 MED ORDER — WARFARIN SODIUM 5 MG PO TABS
5.0000 mg | ORAL_TABLET | Freq: Once | ORAL | Status: AC
  Administered 2019-04-13: 5 mg via ORAL
  Filled 2019-04-13: qty 1

## 2019-04-13 NOTE — Plan of Care (Signed)

## 2019-04-13 NOTE — Progress Notes (Signed)
Oriented to self but stated was somewhere in [place] this morning and at lunch said he was [facility].  Did feed self all of breakfast.

## 2019-04-13 NOTE — Progress Notes (Signed)
Feed self lunch and this afternoon did say that he was at [facility].

## 2019-04-13 NOTE — Progress Notes (Addendum)
PROGRESS NOTE    Noah CharonJeffrey E [name]  ZOX:[id number] DOB: 10/30/1940 DOA: 04/10/2019 PCP: Estanislado PandySasser, Paul W, MD    Brief Narrative:  78 year old male with a history of diabetes, hypertension, obstructive sleep apnea, chronic kidney disease stage III, seizure disorder, admitted to the hospital with 1 day of confusion.  Basic work-up including CT head, blood cultures were found to be unrevealing.  He remains confused.  Further work-up in process.   Assessment & Plan:   Principal Problem:   Acute encephalopathy Active Problems:   DM (diabetes mellitus), type 2 with complications (HCC)   Chronic systolic (congestive) heart failure (HCC)   CAD S/P percutaneous coronary angioplasty   Atrial fibrillation (HCC)   CKD (chronic kidney disease) stage 3, GFR 30-59 ml/min   OSA (obstructive sleep apnea)   Epilepsy (HCC)   1. Acute encephalopathy.  He is noted to have mild dysarthria, but no acute focal neurologic deficits.  No significant hypercarbia on blood gas.  Urinalysis, TSH, ammonia, B12 and RPR unrevealing.  Cannot perform MRI due to presence of pacemaker.  EEG did not show any epileptiform discharges.  Mental status is continues to slowly improve, although not back to baseline.  Wife does not report any recent medication changes and it is unlikely that he took any extra medications. 2. Coronary artery disease.  No complaints of chest pain.  Continue aspirin, statin and beta-blocker 3. Chronic systolic CHF.  EF 40-45%. Holding lasix due to elevated creatinine. Continue Entresto, beta-blocker.  Still has some evidence of lower extremity edema.  Recheck chest x-ray. 4. Insulin dependent diabetes.  A1c was 8.7 earlier this month.  Continue on sliding scale for now. 5. AKI on chronic kidney disease stage III.  Creatinine 1.6 on admission which is consistent with his baseline.  This is trended up with diuresis.  Continue to hold Lasix for now.  Continue to monitor. 6. Paroxysmal atrial fibrillation.   Rate controlled with metoprolol.  Anticoagulated with Coumadin. 7. Epilepsy.  Continue on Keppra and Neurontin. 8. Obstructive sleep apnea.  Continue supplemental O2 at night.  Continue CPAP. 9. Pneumonia.  Patient noted to have small infiltrate at left base.  He does appear to be short of breath and reports having a cough.  He becomes short of breath in conversation.  We will start the patient on antibiotics.   DVT prophylaxis: Warfarin Code Status: Full code Family Communication: Discussed with wife Disposition Plan: Discharge home when mental status has improved   Consultants:     Procedures:     Antimicrobials:       Subjective: Reports having shortness of breath, has some cough.  He admits to feeling confused  Objective: Vitals:   04/13/19 0921 04/13/19 1416 04/13/19 2015 04/13/19 2114  BP:  (!) 148/136  109/63  Pulse:  (!) 121  77  Resp:  16  20  Temp:  98.4 F (36.9 C)  (!) 97.4 F (36.3 C)  TempSrc:  Oral  Oral  SpO2: 96% 97% 97% 96%  Weight:      Height:       No intake or output data in the 24 hours ending 04/13/19 2124 Filed Weights   04/11/19 0237 04/12/19 0438 04/13/19 0617  Weight: 127.4 kg 125.2 kg 125.3 kg    Examination:  General exam: Alert, awake, no distress Respiratory system: Crackles at left base, overall increased respiratory effort. Cardiovascular system:RRR. No murmurs, rubs, gallops. Gastrointestinal system: Abdomen is nondistended, soft and nontender. No organomegaly or masses felt. Normal  bowel sounds heard. Central nervous system: Alert and oriented. No focal neurological deficits. Extremities: 1+ edema bilaterally Skin: No rashes, lesions or ulcers Psychiatry: Confusion is slowly improving     Data Reviewed: I have personally reviewed following labs and imaging studies  CBC: Recent Labs  Lab 04/10/19 1954 04/11/19 0512 04/12/19 0530 04/13/19 0556  WBC 8.7 9.8 8.2 10.3  HGB 11.0* 10.0* 10.4* 10.8*  HCT 36.8* 33.3*  35.2* 36.5*  MCV 92.7 91.7 95.1 94.6  PLT 159 153 187 186   Basic Metabolic Panel: Recent Labs  Lab 04/10/19 1954 04/11/19 0512 04/12/19 0530 04/13/19 0556  NA 143 143 146* 146*  K 4.9 4.7 4.5 4.3  CL 105 103 105 104  CO2 29 28 28 28   GLUCOSE 118* 92 111* 118*  BUN 42* 41* 42* 48*  CREATININE 1.60* 1.57* 1.80* 2.12*  CALCIUM 9.3 9.2 9.4 9.1   GFR: Estimated Creatinine Clearance: 37 mL/min (A) (by C-G formula based on SCr of 2.12 mg/dL (H)). Liver Function Tests: Recent Labs  Lab 04/10/19 1954 04/11/19 0512  AST 15 14*  ALT 15 13  ALKPHOS 68 61  BILITOT 0.6 0.8  PROT 6.9 6.2*  ALBUMIN 3.3* 2.8*   No results for input(s): LIPASE, AMYLASE in the last 168 hours. Recent Labs  Lab 04/10/19 1954  AMMONIA 34   Coagulation Profile: Recent Labs  Lab 04/10/19 1954 04/11/19 0512 04/12/19 0530 04/13/19 0556  INR 2.3* 2.7* 2.1* 2.6*   Cardiac Enzymes: Recent Labs  Lab 04/10/19 1954  CKTOTAL 74   BNP (last 3 results) No results for input(s): PROBNP in the last 8760 hours. HbA1C: No results for input(s): HGBA1C in the last 72 hours. CBG: Recent Labs  Lab 04/12/19 1616 04/13/19 0756 04/13/19 1134 04/13/19 1717 04/13/19 2024  GLUCAP 103* 109* 197* 200* 253*   Lipid Profile: No results for input(s): CHOL, HDL, LDLCALC, TRIG, CHOLHDL, LDLDIRECT in the last 72 hours. Thyroid Function Tests: No results for input(s): TSH, T4TOTAL, FREET4, T3FREE, THYROIDAB in the last 72 hours. Anemia Panel: No results for input(s): VITAMINB12, FOLATE, FERRITIN, TIBC, IRON, RETICCTPCT in the last 72 hours. Sepsis Labs: Recent Labs  Lab 04/10/19 1954  LATICACIDVEN 0.8    Recent Results (from the past 240 hour(s))  SARS CORONAVIRUS 2 (TAT 6-24 HRS) Nasopharyngeal Nasopharyngeal Swab     Status: None   Collection Time: 04/10/19 10:20 PM   Specimen: Nasopharyngeal Swab  Result Value Ref Range Status   SARS Coronavirus 2 NEGATIVE NEGATIVE Final    Comment: (NOTE)  SARS-CoV-2 target nucleic acids are NOT DETECTED. The SARS-CoV-2 RNA is generally detectable in upper and lower respiratory specimens during the acute phase of infection. Negative results do not preclude SARS-CoV-2 infection, do not rule out co-infections with other pathogens, and should not be used as the sole basis for treatment or other patient management decisions. Negative results must be combined with clinical observations, patient history, and epidemiological information. The expected result is Negative. Fact Sheet for Patients: HairSlick.no Fact Sheet for Healthcare Providers: quierodirigir.com This test is not yet approved or cleared by the [place] FDA and  has been authorized for detection and/or diagnosis of SARS-CoV-2 by FDA under an Emergency Use Authorization (EUA). This EUA will remain  in effect (meaning this test can be used) for the duration of the COVID-19 declaration under Section 56 4(b)(1) of the Act, 21 U.S.C. section 360bbb-3(b)(1), unless the authorization is terminated or revoked sooner. Performed at [facility], [address] [place], [place] [address]   Urine culture     Status: Abnormal   Collection Time: 04/11/19 12:20 AM   Specimen: Urine, Random  Result Value Ref Range Status   Specimen Description   Final    URINE, RANDOM Performed at [facility], [address], [place], [place] [address]    Special Requests   Final    NONE Performed at [facility], [address] [place], [place] [address]    Culture (A)  Final    <10,000 COLONIES/mL INSIGNIFICANT GROWTH Performed at [facility], [place] [address], [place], [place] [address]    Report Status 04/12/2019 FINAL  Final         Radiology Studies: EEG  Result Date: 04/12/2019 Lora Havens, MD     04/12/2019 11:20 AM Patient Name: Noah [name] MRN: [id number] Epilepsy Attending: Lora Havens Referring  Physician/Provider: Dr Kathie Dike Date: 04/12/2019 Duration: 22.54 minutes Patient history: 78 year old old male with history of epilepsy now presented with altered mental status.  EEG to evaluate for seizures. Level of alertness: Awake AEDs during EEG study: Keppra, gabapentin Technical aspects: This EEG study was done with scalp electrodes positioned according to the 10-20 International system of electrode placement. Electrical activity was acquired at a sampling rate of 500Hz  and reviewed with a high frequency filter of 70Hz  and a low frequency filter of 1Hz . EEG data were recorded continuously and digitally stored. Description: During awake state, no clear posterior dominant rhythm was seen.  EEG showed continuous generalized 3 to 6 Hz theta-delta slowing. Hyperventilation and photic stimulation were not performed. Abnormality -Continuous slow, generalized IMPRESSION: This study is suggestive of mild to moderate diffuse encephalopathy, nonspecific to etiology.  No seizures or epileptiform discharges were seen throughout the recording. Lora Havens   DG CHEST PORT 1 VIEW  Result Date: 04/12/2019 CLINICAL DATA:  Shortness of breath and weakness. EXAM: PORTABLE CHEST 1 VIEW COMPARISON:  04/10/2019 FINDINGS: 2832 hours. The cardio pericardial silhouette is enlarged. There is pulmonary vascular congestion without overt pulmonary edema. Patchy retrocardiac airspace disease noted with tiny left pleural effusion. Old posterior right rib fractures noted. Left permanent pacemaker remains in place. Telemetry leads overlie the chest. IMPRESSION: Cardiomegaly with left base atelectasis or infiltrate and small left pleural effusion. Electronically Signed   By: Misty Stanley M.D.   On: 04/12/2019 20:44        Scheduled Meds: . aspirin EC  81 mg Oral Daily  . atorvastatin  40 mg Oral q1800  . famotidine  20 mg Oral QHS  . finasteride  5 mg Oral QPM  . FLUoxetine  20 mg Oral QPM  . gabapentin  100 mg  Oral Daily  . gabapentin  200 mg Oral QHS  . insulin aspart  0-9 Units Subcutaneous TID WC  . levETIRAcetam  750 mg Oral BID  . metoprolol succinate  100 mg Oral Daily  . nystatin   Topical BID  . pantoprazole  40 mg Oral Daily  . sacubitril-valsartan  1 tablet Oral BID  . sodium chloride flush  3 mL Intravenous Q12H  . sodium chloride flush  3 mL Intravenous Q12H  . tamsulosin  0.8 mg Oral QPM  . Warfarin - Pharmacist Dosing Inpatient   Does not apply q1800   Continuous Infusions: . sodium chloride       LOS: 1 day    Time spent: 6mins    Kathie Dike, MD [facility]   If 7PM-7AM, please  contact night-coverage [email or web address]  04/13/2019, 9:24 PM

## 2019-04-13 NOTE — Progress Notes (Signed)
ANTICOAGULATION CONSULT NOTE - Pharmacy Consult for Coumadin Indication: atrial fibrillation  Allergies  Allergen Reactions  . Codeine Other (See Comments)    constipation  . Erythromycin-Sulfisoxazole Other (See Comments)    Causes infection in throat and eyes    Patient Measurements: Height: 5\' 8"  (172.7 cm) Weight: 276 lb 3.8 oz (125.3 kg) IBW/kg (Calculated) : 68.4  Vital Signs: Temp: 97.8 F (36.6 C) (12/25 0617) Temp Source: Oral (12/24 2139) BP: 105/48 (12/25 0617) Pulse Rate: 77 (12/25 0617)  Labs: Recent Labs    04/10/19 1954 04/11/19 0512 04/12/19 0530 04/13/19 0556  HGB 11.0* 10.0* 10.4* 10.8*  HCT 36.8* 33.3* 35.2* 36.5*  PLT 159 153 187 186  LABPROT 25.1* 28.6* 23.7* 27.7*  INR 2.3* 2.7* 2.1* 2.6*  CREATININE 1.60* 1.57* 1.80* 2.12*  CKTOTAL 74  --   --   --     Estimated Creatinine Clearance: 37 mL/min (A) (by C-G formula based on SCr of 2.12 mg/dL (H)).   Medical History: Past Medical History:  Diagnosis Date  . CAD S/P percutaneous coronary angioplasty    remote PCIs in 1990's- last CFX PCI 2008  . Chronic kidney disease, stage III (moderate)   . Diabetes mellitus with complication (HCC)    with hyperglycemia and diabetic autonomic poly neuropathy  . Gastro-esophageal reflux disease with esophagitis   . Hyperlipidemia   . Hypertension   . Ischemic cardiomyopathy 01/20/2018   St Jude ICD   . Morbid obesity ([place])   . Obstructive sleep apnea    on CPAP  . Polyneuropathy   . Primary insomnia     Medications:  See electronic med rec  Assessment: 78 y.o. M presents with confusion. Pt on coumadin PTA for afib. Admission INR 2.3 (therapeutic). CBC ok on admission Home dose: 2.5mg  daily except for 5mg  on Tues/Thur/Sat INR 2.1>> 2.6  Goal of Therapy:  INR 2-3 Monitor platelets by anticoagulation protocol: Yes   Plan:  Coumadin 2.5mg  today Daily INR Monitor for S/S of bleeding  Noah [name], BS [place], BCPS Clinical  Pharmacist Pager [phone] 04/13/2019 9:36 AM

## 2019-04-14 LAB — CBC
HCT: 35.6 % — ABNORMAL LOW (ref 39.0–52.0)
Hemoglobin: 10.2 g/dL — ABNORMAL LOW (ref 13.0–17.0)
MCH: 27.6 pg (ref 26.0–34.0)
MCHC: 28.7 g/dL — ABNORMAL LOW (ref 30.0–36.0)
MCV: 96.2 fL (ref 80.0–100.0)
Platelets: 165 10*3/uL (ref 150–400)
RBC: 3.7 MIL/uL — ABNORMAL LOW (ref 4.22–5.81)
RDW: 13 % (ref 11.5–15.5)
WBC: 11.9 10*3/uL — ABNORMAL HIGH (ref 4.0–10.5)
nRBC: 0 % (ref 0.0–0.2)

## 2019-04-14 LAB — BASIC METABOLIC PANEL
Anion gap: 10 (ref 5–15)
BUN: 59 mg/dL — ABNORMAL HIGH (ref 8–23)
CO2: 31 mmol/L (ref 22–32)
Calcium: 8.6 mg/dL — ABNORMAL LOW (ref 8.9–10.3)
Chloride: 101 mmol/L (ref 98–111)
Creatinine, Ser: 2.41 mg/dL — ABNORMAL HIGH (ref 0.61–1.24)
GFR calc Af Amer: 29 mL/min — ABNORMAL LOW (ref >60)
GFR calc non Af Amer: 25 mL/min — ABNORMAL LOW (ref >60)
Glucose, Bld: 237 mg/dL — ABNORMAL HIGH (ref 70–99)
Potassium: 4.7 mmol/L (ref 3.5–5.1)
Sodium: 142 mmol/L (ref 135–145)

## 2019-04-14 LAB — GLUCOSE, CAPILLARY
Glucose-Capillary: 199 mg/dL — ABNORMAL HIGH (ref 70–99)
Glucose-Capillary: 210 mg/dL — ABNORMAL HIGH (ref 70–99)
Glucose-Capillary: 160 mg/dL — ABNORMAL HIGH (ref 70–99)
Glucose-Capillary: 213 mg/dL — ABNORMAL HIGH (ref 70–99)

## 2019-04-14 LAB — PROTIME-INR
INR: 3.7 — ABNORMAL HIGH (ref 0.8–1.2)
Prothrombin Time: 36.8 seconds — ABNORMAL HIGH (ref 11.4–15.2)

## 2019-04-14 MED ORDER — TRAZODONE HCL 50 MG PO TABS
50.0000 mg | ORAL_TABLET | Freq: Once | ORAL | Status: AC
  Administered 2019-04-14: 50 mg via ORAL
  Filled 2019-04-14: qty 1

## 2019-04-14 MED ORDER — SODIUM CHLORIDE 0.9 % IV BOLUS
500.0000 mL | Freq: Once | INTRAVENOUS | Status: AC
  Administered 2019-04-14: 500 mL via INTRAVENOUS

## 2019-04-14 MED ORDER — SODIUM CHLORIDE 0.9 % IV BOLUS
1000.0000 mL | Freq: Once | INTRAVENOUS | Status: AC
  Administered 2019-04-14: 1000 mL via INTRAVENOUS

## 2019-04-14 NOTE — Progress Notes (Signed)
ANTICOAGULATION CONSULT NOTE - Pharmacy Consult for Coumadin Indication: atrial fibrillation  Allergies  Allergen Reactions  . Codeine Other (See Comments)    constipation  . Erythromycin-Sulfisoxazole Other (See Comments)    Causes infection in throat and eyes    Patient Measurements: Height: 5\' 8"  (172.7 cm) Weight: 278 lb 3.5 oz (126.2 kg) IBW/kg (Calculated) : 68.4  Vital Signs: Temp: 97.6 F (36.4 C) (12/26 0500) Temp Source: Oral (12/25 2114) BP: 110/52 (12/26 0500) Pulse Rate: 70 (12/26 0500)  Labs: Recent Labs    04/12/19 0530 04/13/19 0556 04/14/19 0555  HGB 10.4* 10.8* 10.2*  HCT 35.2* 36.5* 35.6*  PLT 187 186 165  LABPROT 23.7* 27.7* 36.8*  INR 2.1* 2.6* 3.7*  CREATININE 1.80* 2.12* 2.41*    Estimated Creatinine Clearance: 32.7 mL/min (A) (by C-G formula based on SCr of 2.41 mg/dL (H)).   Medical History: Past Medical History:  Diagnosis Date  . CAD S/P percutaneous coronary angioplasty    remote PCIs in 1990's- last CFX PCI 2008  . Chronic kidney disease, stage III (moderate)   . Diabetes mellitus with complication (HCC)    with hyperglycemia and diabetic autonomic poly neuropathy  . Gastro-esophageal reflux disease with esophagitis   . Hyperlipidemia   . Hypertension   . Ischemic cardiomyopathy 01/20/2018   St Jude ICD   . Morbid obesity ([place])   . Obstructive sleep apnea    on CPAP  . Polyneuropathy   . Primary insomnia     Medications:  See electronic med rec  Assessment: 78 y.o. M presents with confusion. Pt on coumadin PTA for afib. Admission INR 2.3 (therapeutic). CBC ok on admission Home dose: 2.5mg  daily except for 5mg  on Tues/Thur/Sat INR 2.1>> 2.6 >3.7  Goal of Therapy:  INR 2-3 Monitor platelets by anticoagulation protocol: Yes   Plan:  Hold warfarin today  Daily INR Monitor for S/S of bleeding  Thomasenia Sales, PharmD, MBA, BCGP Clinical Pharmacist  04/14/2019 8:03 AM

## 2019-04-14 NOTE — Progress Notes (Signed)
PROGRESS NOTE    Noah [name]  TUU:[id number] DOB: 04-22-1940 DOA: 04/10/2019 PCP: Estanislado Pandy, MD    Brief Narrative:  78 year old male with a history of diabetes, hypertension, obstructive sleep apnea, chronic kidney disease stage III, seizure disorder, admitted to the hospital with 1 day of confusion.  Basic work-up including CT head, blood cultures were found to be unrevealing.  He remains confused.  Further work-up in process.   Assessment & Plan:   Principal Problem:   Acute encephalopathy Active Problems:   DM (diabetes mellitus), type 2 with complications (HCC)   Chronic systolic (congestive) heart failure (HCC)   CAD S/P percutaneous coronary angioplasty   Atrial fibrillation (HCC)   CKD (chronic kidney disease) stage 3, GFR 30-59 ml/min   OSA (obstructive sleep apnea)   Epilepsy (HCC)   1. Acute encephalopathy.  He is noted to have mild dysarthria, but no acute focal neurologic deficits.  No significant hypercarbia on blood gas.  Urinalysis, TSH, ammonia, B12 and RPR unrevealing.  Cannot perform MRI due to presence of pacemaker.  EEG did not show any epileptiform discharges. Wife does not report any recent medication changes and it is unlikely that he took any extra medications.  Initially, patient's mental status had significantly improved, but today he was increasingly lethargic.  This was likely related to hypotension since his mental status began to improve once he was receiving IV fluids.   2. Hypotension.  Likely related to volume depletion in the setting of antihypertensive medications.  Metoprolol and Entresto have been discontinued.  He received IV fluid boluses.  Overall blood pressure is improving. 3. Coronary artery disease.  No complaints of chest pain.  Continue aspirin, statin and beta-blocker 4. Chronic systolic CHF.  EF 40-45%. Holding lasix due to elevated creatinine. Still has some evidence of lower extremity edema.  Chest x-ray does not show any  evidence of congestive heart failure. 5. Insulin dependent diabetes.  A1c was 8.7 earlier this month.  Continue on sliding scale for now. 6. AKI on chronic kidney disease stage III.  Creatinine 1.6 on admission which is consistent with his baseline.  This has trended up with diuresis.  Continue to hold Lasix for now.  Continue to monitor. 7. Paroxysmal atrial fibrillation.  Rate controlled with metoprolol.  Anticoagulated with Coumadin. 8. Epilepsy.  Continue on Keppra and Neurontin. 9. Obstructive sleep apnea.  Continue supplemental O2 at night.  Continue CPAP. 10. Pneumonia.  Patient noted to have small infiltrate at left base.  He does appear to be short of breath and reports having a cough.  He becomes short of breath in conversation.  He was started on Augmentin   DVT prophylaxis: Warfarin Code Status: Full code Family Communication: Discussed with wife Disposition Plan: Discharge home when mental status has improved, physical therapy evaluation   Consultants:     Procedures:     Antimicrobials:       Subjective: Patient was hypotensive earlier today.  He was seen increasingly somnolent.  Objective: Vitals:   04/14/19 1300 04/14/19 1453 04/14/19 1545 04/14/19 1805  BP: (!) 83/41 (!) 85/41 (!) 81/62 92/60  Pulse: 68 69 68 68  Resp: 18   18  Temp: 97.8 F (36.6 C)   97.8 F (36.6 C)  TempSrc: Oral   Axillary  SpO2: 98%   99%  Weight:      Height:        Intake/Output Summary (Last 24 hours) at 04/14/2019 2113 Last data filed at 04/14/2019  1805 Gross per 24 hour  Intake 1552.81 ml  Output --  Net 1552.81 ml   Filed Weights   04/12/19 0438 04/13/19 0617 04/14/19 0500  Weight: 125.2 kg 125.3 kg 126.2 kg    Examination:  General exam: Somnolent today Respiratory system: Clear to auscultation. Respiratory effort normal. Cardiovascular system: Irregular. No murmurs, rubs, gallops. Gastrointestinal system: Abdomen is nondistended, soft and nontender. No  organomegaly or masses felt. Normal bowel sounds heard. Central nervous system:  No focal neurological deficits. Extremities: 1+ edema bilaterally Skin: No rashes, lesions or ulcers Psychiatry: Somnolent      Data Reviewed: I have personally reviewed following labs and imaging studies  CBC: Recent Labs  Lab 04/10/19 1954 04/11/19 0512 04/12/19 0530 04/13/19 0556 04/14/19 0555  WBC 8.7 9.8 8.2 10.3 11.9*  HGB 11.0* 10.0* 10.4* 10.8* 10.2*  HCT 36.8* 33.3* 35.2* 36.5* 35.6*  MCV 92.7 91.7 95.1 94.6 96.2  PLT 159 153 187 186 127   Basic Metabolic Panel: Recent Labs  Lab 04/10/19 1954 04/11/19 0512 04/12/19 0530 04/13/19 0556 04/14/19 0555  NA 143 143 146* 146* 142  K 4.9 4.7 4.5 4.3 4.7  CL 105 103 105 104 101  CO2 29 28 28 28 31   GLUCOSE 118* 92 111* 118* 237*  BUN 42* 41* 42* 48* 59*  CREATININE 1.60* 1.57* 1.80* 2.12* 2.41*  CALCIUM 9.3 9.2 9.4 9.1 8.6*   GFR: Estimated Creatinine Clearance: 32.7 mL/min (A) (by C-G formula based on SCr of 2.41 mg/dL (H)). Liver Function Tests: Recent Labs  Lab 04/10/19 1954 04/11/19 0512  AST 15 14*  ALT 15 13  ALKPHOS 68 61  BILITOT 0.6 0.8  PROT 6.9 6.2*  ALBUMIN 3.3* 2.8*   No results for input(s): LIPASE, AMYLASE in the last 168 hours. Recent Labs  Lab 04/10/19 1954  AMMONIA 34   Coagulation Profile: Recent Labs  Lab 04/10/19 1954 04/11/19 0512 04/12/19 0530 04/13/19 0556 04/14/19 0555  INR 2.3* 2.7* 2.1* 2.6* 3.7*   Cardiac Enzymes: Recent Labs  Lab 04/10/19 1954  CKTOTAL 74   BNP (last 3 results) No results for input(s): PROBNP in the last 8760 hours. HbA1C: No results for input(s): HGBA1C in the last 72 hours. CBG: Recent Labs  Lab 04/13/19 1717 04/13/19 2024 04/14/19 0741 04/14/19 1134 04/14/19 1649  GLUCAP 200* 253* 199* 210* 160*   Lipid Profile: No results for input(s): CHOL, HDL, LDLCALC, TRIG, CHOLHDL, LDLDIRECT in the last 72 hours. Thyroid Function Tests: No results for  input(s): TSH, T4TOTAL, FREET4, T3FREE, THYROIDAB in the last 72 hours. Anemia Panel: No results for input(s): VITAMINB12, FOLATE, FERRITIN, TIBC, IRON, RETICCTPCT in the last 72 hours. Sepsis Labs: Recent Labs  Lab 04/10/19 1954  LATICACIDVEN 0.8    Recent Results (from the past 240 hour(s))  SARS CORONAVIRUS 2 (TAT 6-24 HRS) Nasopharyngeal Nasopharyngeal Swab     Status: None   Collection Time: 04/10/19 10:20 PM   Specimen: Nasopharyngeal Swab  Result Value Ref Range Status   SARS Coronavirus 2 NEGATIVE NEGATIVE Final    Comment: (NOTE) SARS-CoV-2 target nucleic acids are NOT DETECTED. The SARS-CoV-2 RNA is generally detectable in upper and lower respiratory specimens during the acute phase of infection. Negative results do not preclude SARS-CoV-2 infection, do not rule out co-infections with other pathogens, and should not be used as the sole basis for treatment or other patient management decisions. Negative results must be combined with clinical observations, patient history, and epidemiological information. The expected result is Negative. Fact  Sheet for Patients: HairSlick.no Fact Sheet for Healthcare Providers: quierodirigir.com This test is not yet approved or cleared by the [place] FDA and  has been authorized for detection and/or diagnosis of SARS-CoV-2 by FDA under an Emergency Use Authorization (EUA). This EUA will remain  in effect (meaning this test can be used) for the duration of the COVID-19 declaration under Section 56 4(b)(1) of the Act, 21 U.S.C. section 360bbb-3(b)(1), unless the authorization is terminated or revoked sooner. Performed at [facility], [address] [place], [place] [address]   Urine culture     Status: Abnormal   Collection Time: 04/11/19 12:20 AM   Specimen: Urine, Random  Result Value Ref Range Status   Specimen Description   Final    URINE, RANDOM Performed at  [facility], [address] [place], [place] [address]    Special Requests   Final    NONE Performed at [facility], [address] [place], [place] [address]    Culture (A)  Final    <10,000 COLONIES/mL INSIGNIFICANT GROWTH Performed at [facility], [address] [place], [place] [address]    Report Status 04/12/2019 FINAL  Final         Radiology Studies: No results found.      Scheduled Meds: . amoxicillin-clavulanate  1 tablet Oral Q12H  . aspirin EC  81 mg Oral Daily  . atorvastatin  40 mg Oral q1800  . famotidine  20 mg Oral QHS  . finasteride  5 mg Oral QPM  . FLUoxetine  20 mg Oral QPM  . gabapentin  100 mg Oral Daily  . gabapentin  200 mg Oral QHS  . insulin aspart  0-9 Units Subcutaneous TID WC  . levETIRAcetam  750 mg Oral BID  . nystatin   Topical BID  . pantoprazole  40 mg Oral Daily  . sodium chloride flush  3 mL Intravenous Q12H  . sodium chloride flush  3 mL Intravenous Q12H  . tamsulosin  0.8 mg Oral QPM  . traZODone  50 mg Oral Once  . Warfarin - Pharmacist Dosing Inpatient   Does not apply q1800   Continuous Infusions: . sodium chloride       LOS: 2 days    Time spent:    Erick Blinks, MD [facility]   If 7PM-7AM, please contact night-coverage [email or web address]  04/14/2019, 9:13 PM

## 2019-04-15 LAB — BASIC METABOLIC PANEL
Anion gap: 13 (ref 5–15)
BUN: 67 mg/dL — ABNORMAL HIGH (ref 8–23)
CO2: 23 mmol/L (ref 22–32)
Calcium: 8.2 mg/dL — ABNORMAL LOW (ref 8.9–10.3)
Chloride: 105 mmol/L (ref 98–111)
Creatinine, Ser: 3.29 mg/dL — ABNORMAL HIGH (ref 0.61–1.24)
GFR calc Af Amer: 20 mL/min — ABNORMAL LOW (ref >60)
GFR calc non Af Amer: 17 mL/min — ABNORMAL LOW (ref >60)
Glucose, Bld: 176 mg/dL — ABNORMAL HIGH (ref 70–99)
Potassium: 5.1 mmol/L (ref 3.5–5.1)
Sodium: 141 mmol/L (ref 135–145)

## 2019-04-15 LAB — CBC
HCT: 38.1 % — ABNORMAL LOW (ref 39.0–52.0)
Hemoglobin: 10.8 g/dL — ABNORMAL LOW (ref 13.0–17.0)
MCH: 27.8 pg (ref 26.0–34.0)
MCHC: 28.3 g/dL — ABNORMAL LOW (ref 30.0–36.0)
MCV: 98.2 fL (ref 80.0–100.0)
Platelets: 150 10*3/uL (ref 150–400)
RBC: 3.88 MIL/uL — ABNORMAL LOW (ref 4.22–5.81)
RDW: 12.8 % (ref 11.5–15.5)
WBC: 9.6 10*3/uL (ref 4.0–10.5)
nRBC: 0 % (ref 0.0–0.2)

## 2019-04-15 LAB — GLUCOSE, CAPILLARY
Glucose-Capillary: 151 mg/dL — ABNORMAL HIGH (ref 70–99)
Glucose-Capillary: 180 mg/dL — ABNORMAL HIGH (ref 70–99)
Glucose-Capillary: 244 mg/dL — ABNORMAL HIGH (ref 70–99)
Glucose-Capillary: 263 mg/dL — ABNORMAL HIGH (ref 70–99)

## 2019-04-15 LAB — PROTIME-INR
INR: 4.1 (ref 0.8–1.2)
Prothrombin Time: 40.1 seconds — ABNORMAL HIGH (ref 11.4–15.2)

## 2019-04-15 LAB — MAGNESIUM: Magnesium: 1.9 mg/dL (ref 1.7–2.4)

## 2019-04-15 MED ORDER — LACTATED RINGERS IV SOLN: INTRAVENOUS | Status: DC

## 2019-04-15 NOTE — Progress Notes (Signed)
PROGRESS NOTE    Noah [name]  FFM:[id number] DOB: 03/21/1941 DOA: 04/10/2019 PCP: Manon Hilding, MD    Brief Narrative:  78 year old male with a history of diabetes, hypertension, obstructive sleep apnea, chronic kidney disease stage III, seizure disorder, admitted to the hospital with 1 day of confusion.  Basic work-up including CT head, blood cultures were found to be unrevealing.  He remains confused.  Further work-up in process.   Assessment & Plan:   Principal Problem:   Acute encephalopathy Active Problems:   DM (diabetes mellitus), type 2 with complications (HCC)   Chronic systolic (congestive) heart failure (HCC)   CAD S/P percutaneous coronary angioplasty   Atrial fibrillation (HCC)   CKD (chronic kidney disease) stage 3, GFR 30-59 ml/min   OSA (obstructive sleep apnea)   Epilepsy ([place])   1. Acute encephalopathy.  He is noted to have mild dysarthria, but no acute focal neurologic deficits.  No significant hypercarbia on blood gas.  Urinalysis, TSH, ammonia, B12 and RPR unrevealing.  Cannot perform MRI due to presence of pacemaker.  EEG did not show any epileptiform discharges. Wife does not report any recent medication changes and it is unlikely that he took any extra medications.  Initially, patient's mental status had significantly improved, but then began to become lethargic, likely due to hypotension.  Continue IV fluids and monitor mental status.   2. Hypotension.  Likely related to volume depletion in the setting of antihypertensive medications.  Metoprolol and Entresto have been discontinued.  He received IV fluid boluses.  Overall blood pressure is improving, but still low.  Will start on gentle maintenance fluids. 3. Coronary artery disease.  No complaints of chest pain.  Continue aspirin, statin and beta-blocker 4. Chronic systolic CHF.  EF 40-45%. Holding lasix due to elevated creatinine. Still has some evidence of lower extremity edema.  Chest x-ray does not show  any evidence of congestive heart failure. 5. Insulin dependent diabetes.  A1c was 8.7 earlier this month.  Continue on sliding scale for now. 6. AKI on chronic kidney disease stage III.  Baseline creatinine of 1.6..  Patient was continued on his home dose of diuretics and Entresto.  His creatinine has since trended up to 3.29.  This is likely being contributed by episodes of hypotension yesterday.  Will request nephrology input.  He has been started on IV hydration. 7. Paroxysmal atrial fibrillation.  Rate controlled with metoprolol.  Anticoagulated with Coumadin. 8. Epilepsy.  Continue on Keppra and Neurontin. 9. Obstructive sleep apnea.  Continue supplemental O2 at night.  Continue CPAP. 10. Pneumonia.  Patient noted to have small infiltrate at left base.  He does appear to be mildly short of breath and reports having a cough.  He becomes short of breath in conversation.  He was started on Augmentin   DVT prophylaxis: Warfarin Code Status: Full code Family Communication: Discussed with wife Disposition Plan: Discharge home when mental status has improved, physical therapy evaluation   Consultants:     Procedures:     Antimicrobials:       Subjective: Patient was more alert earlier today, was able to get up to go to the bathroom.  At this time, he is sleeping, but wakes up to voice.  He does answer questions.  Objective: Vitals:   04/15/19 1518 04/15/19 1855 04/15/19 1951 04/15/19 2142  BP: (!) 102/52 (!) 106/40  (!) 83/56  Pulse: 66 69  61  Resp:    18  Temp:    97.8  F (36.6 C)  TempSrc:      SpO2:   99% 100%  Weight:      Height:        Intake/Output Summary (Last 24 hours) at 04/15/2019 2150 Last data filed at 04/15/2019 1608 Gross per 24 hour  Intake 26.88 ml  Output 400 ml  Net -373.12 ml   Filed Weights   04/12/19 0438 04/13/19 0617 04/14/19 0500  Weight: 125.2 kg 125.3 kg 126.2 kg    Examination:  General exam: No distress, he is lethargic, but  wakes up to voice Respiratory system: Clear to auscultation. Respiratory effort normal. Cardiovascular system: Irregular. No murmurs, rubs, gallops. Gastrointestinal system: Abdomen is nondistended, soft and nontender. No organomegaly or masses felt. Normal bowel sounds heard. Central nervous system:  No focal neurological deficits. Extremities: 1+ edema bilaterally Skin: No rashes, lesions or ulcers Psychiatry: Somnolent, wakes up to voice  Data Reviewed: I have personally reviewed following labs and imaging studies  CBC: Recent Labs  Lab 04/11/19 0512 04/12/19 0530 04/13/19 0556 04/14/19 0555 04/15/19 0603  WBC 9.8 8.2 10.3 11.9* 9.6  HGB 10.0* 10.4* 10.8* 10.2* 10.8*  HCT 33.3* 35.2* 36.5* 35.6* 38.1*  MCV 91.7 95.1 94.6 96.2 98.2  PLT 153 187 186 165 150   Basic Metabolic Panel: Recent Labs  Lab 04/11/19 0512 04/12/19 0530 04/13/19 0556 04/14/19 0555 04/15/19 0603  NA 143 146* 146* 142 141  K 4.7 4.5 4.3 4.7 5.1  CL 103 105 104 101 105  CO2 28 28 28 31 23   GLUCOSE 92 111* 118* 237* 176*  BUN 41* 42* 48* 59* 67*  CREATININE 1.57* 1.80* 2.12* 2.41* 3.29*  CALCIUM 9.2 9.4 9.1 8.6* 8.2*  MG  --   --   --   --  1.9   GFR: Estimated Creatinine Clearance: 23.9 mL/min (A) (by C-G formula based on SCr of 3.29 mg/dL (H)). Liver Function Tests: Recent Labs  Lab 04/10/19 1954 04/11/19 0512  AST 15 14*  ALT 15 13  ALKPHOS 68 61  BILITOT 0.6 0.8  PROT 6.9 6.2*  ALBUMIN 3.3* 2.8*   No results for input(s): LIPASE, AMYLASE in the last 168 hours. Recent Labs  Lab 04/10/19 1954  AMMONIA 34   Coagulation Profile: Recent Labs  Lab 04/11/19 0512 04/12/19 0530 04/13/19 0556 04/14/19 0555 04/15/19 0603  INR 2.7* 2.1* 2.6* 3.7* 4.1*   Cardiac Enzymes: Recent Labs  Lab 04/10/19 1954  CKTOTAL 74   BNP (last 3 results) No results for input(s): PROBNP in the last 8760 hours. HbA1C: No results for input(s): HGBA1C in the last 72 hours. CBG: Recent Labs  Lab  04/14/19 2122 04/15/19 0745 04/15/19 1150 04/15/19 1719 04/15/19 2034  GLUCAP 213* 151* 180* 244* 263*   Lipid Profile: No results for input(s): CHOL, HDL, LDLCALC, TRIG, CHOLHDL, LDLDIRECT in the last 72 hours. Thyroid Function Tests: No results for input(s): TSH, T4TOTAL, FREET4, T3FREE, THYROIDAB in the last 72 hours. Anemia Panel: No results for input(s): VITAMINB12, FOLATE, FERRITIN, TIBC, IRON, RETICCTPCT in the last 72 hours. Sepsis Labs: Recent Labs  Lab 04/10/19 1954  LATICACIDVEN 0.8    Recent Results (from the past 240 hour(s))  SARS CORONAVIRUS 2 (TAT 6-24 HRS) Nasopharyngeal Nasopharyngeal Swab     Status: None   Collection Time: 04/10/19 10:20 PM   Specimen: Nasopharyngeal Swab  Result Value Ref Range Status   SARS Coronavirus 2 NEGATIVE NEGATIVE Final    Comment: (NOTE) SARS-CoV-2 target nucleic acids are NOT DETECTED. The  SARS-CoV-2 RNA is generally detectable in upper and lower respiratory specimens during the acute phase of infection. Negative results do not preclude SARS-CoV-2 infection, do not rule out co-infections with other pathogens, and should not be used as the sole basis for treatment or other patient management decisions. Negative results must be combined with clinical observations, patient history, and epidemiological information. The expected result is Negative. Fact Sheet for Patients: HairSlick.no Fact Sheet for Healthcare Providers: quierodirigir.com This test is not yet approved or cleared by the [place] FDA and  has been authorized for detection and/or diagnosis of SARS-CoV-2 by FDA under an Emergency Use Authorization (EUA). This EUA will remain  in effect (meaning this test can be used) for the duration of the COVID-19 declaration under Section 56 4(b)(1) of the Act, 21 U.S.C. section 360bbb-3(b)(1), unless the authorization is terminated or revoked sooner. Performed at  [facility], [address] [place], [place] [address]   Urine culture     Status: Abnormal   Collection Time: 04/11/19 12:20 AM   Specimen: Urine, Random  Result Value Ref Range Status   Specimen Description   Final    URINE, RANDOM Performed at [facility], [address] [place], [place] [address]    Special Requests   Final    NONE Performed at [facility], [address], [place], [place] [address]    Culture (A)  Final    <10,000 COLONIES/mL INSIGNIFICANT GROWTH Performed at [facility], [address] [place], [place] [address]    Report Status 04/12/2019 FINAL  Final         Radiology Studies: No results found.      Scheduled Meds: . amoxicillin-clavulanate  1 tablet Oral Q12H  . aspirin EC  81 mg Oral Daily  . atorvastatin  40 mg Oral q1800  . famotidine  20 mg Oral QHS  . finasteride  5 mg Oral QPM  . FLUoxetine  20 mg Oral QPM  . gabapentin  100 mg Oral Daily  . gabapentin  200 mg Oral QHS  . insulin aspart  0-9 Units Subcutaneous TID WC  . levETIRAcetam  750 mg Oral BID  . nystatin   Topical BID  . pantoprazole  40 mg Oral Daily  . sodium chloride flush  3 mL Intravenous Q12H  . sodium chloride flush  3 mL Intravenous Q12H  . tamsulosin  0.8 mg Oral QPM  . Warfarin - Pharmacist Dosing Inpatient   Does not apply q1800   Continuous Infusions: . sodium chloride    . lactated ringers 100 mL/hr at 04/15/19 1522     LOS: 3 days    Time spent:    Erick Blinks, MD [facility]   If 7PM-7AM, please contact night-coverage [email or web address]  04/15/2019, 9:50 PM

## 2019-04-15 NOTE — Progress Notes (Signed)
CRITICAL VALUE ALERT  Critical Value:  INR 4.1  Date & Time Notied:  04/15/2019 0901  Provider Notified: Dr. Roderic Palau  Orders Received/Actions taken: awaiting call back/ orders

## 2019-04-15 NOTE — Progress Notes (Signed)
ANTICOAGULATION CONSULT NOTE - Pharmacy Consult for Coumadin Indication: atrial fibrillation  Allergies  Allergen Reactions  . Codeine Other (See Comments)    constipation  . Erythromycin-Sulfisoxazole Other (See Comments)    Causes infection in throat and eyes    Patient Measurements: Height: 5\' 8"  (172.7 cm) Weight: 278 lb 3.5 oz (126.2 kg) IBW/kg (Calculated) : 68.4  Vital Signs: Temp: 97.5 F (36.4 C) (12/27 0615) Temp Source: Oral (12/27 0615) BP: 86/46 (12/27 0615) Pulse Rate: 71 (12/27 0615)  Labs: Recent Labs    04/13/19 0556 04/14/19 0555 04/15/19 0603  HGB 10.8* 10.2* 10.8*  HCT 36.5* 35.6* 38.1*  PLT 186 165 150  LABPROT 27.7* 36.8* 40.1*  INR 2.6* 3.7* 4.1*  CREATININE 2.12* 2.41*  --     Estimated Creatinine Clearance: 32.7 mL/min (A) (by C-G formula based on SCr of 2.41 mg/dL (H)).   Medical History: Past Medical History:  Diagnosis Date  . CAD S/P percutaneous coronary angioplasty    remote PCIs in 1990's- last CFX PCI 2008  . Chronic kidney disease, stage III (moderate)   . Diabetes mellitus with complication (HCC)    with hyperglycemia and diabetic autonomic poly neuropathy  . Gastro-esophageal reflux disease with esophagitis   . Hyperlipidemia   . Hypertension   . Ischemic cardiomyopathy 01/20/2018   St Jude ICD   . Morbid obesity ([place])   . Obstructive sleep apnea    on CPAP  . Polyneuropathy   . Primary insomnia     Medications:  See electronic med rec  Assessment: 78 y.o. M presents with confusion. Pt on coumadin PTA for afib. Admission INR 2.3 (therapeutic). CBC ok on admission Home dose: 2.5mg  daily except for 5mg  on Tues/Thur/Sat INR 2.1>> 2.6 >3.7>4.1  Goal of Therapy:  INR 2-3 Monitor platelets by anticoagulation protocol: Yes   Plan:  Hold warfarin today  Daily INR Monitor for S/S of bleeding  Thomasenia Sales, PharmD, MBA, BCGP Clinical Pharmacist  04/15/2019 10:04 AM

## 2019-04-15 NOTE — Progress Notes (Signed)
Per MD order to continue to monitor.

## 2019-04-15 NOTE — Progress Notes (Signed)
Pt BP=86/46, temp=97.5 oral, message sent to on call Dr. Olevia Bowens, awaiting further instructions.

## 2019-04-15 NOTE — Progress Notes (Signed)
Per telemetry patient had a 17 beat run of V-Tach, strip saved, MD notified.

## 2019-04-16 ENCOUNTER — Inpatient Hospital Stay (HOSPITAL_COMMUNITY): Payer: Medicare HMO

## 2019-04-16 LAB — CBC
HCT: 33.6 % — ABNORMAL LOW (ref 39.0–52.0)
Hemoglobin: 9.6 g/dL — ABNORMAL LOW (ref 13.0–17.0)
MCH: 28.1 pg (ref 26.0–34.0)
MCHC: 28.6 g/dL — ABNORMAL LOW (ref 30.0–36.0)
MCV: 98.2 fL (ref 80.0–100.0)
Platelets: 108 10*3/uL — ABNORMAL LOW (ref 150–400)
RBC: 3.42 MIL/uL — ABNORMAL LOW (ref 4.22–5.81)
RDW: 12.8 % (ref 11.5–15.5)
WBC: 9.3 10*3/uL (ref 4.0–10.5)
nRBC: 0 % (ref 0.0–0.2)

## 2019-04-16 LAB — URINALYSIS, COMPLETE (UACMP) WITH MICROSCOPIC
Bacteria, UA: NONE SEEN
Bilirubin Urine: NEGATIVE
Glucose, UA: 50 mg/dL — AB
Ketones, ur: NEGATIVE mg/dL
Leukocytes,Ua: NEGATIVE
Nitrite: NEGATIVE
Protein, ur: 30 mg/dL — AB
RBC / HPF: >50 RBC/hpf — ABNORMAL HIGH (ref 0–5)
Specific Gravity, Urine: 1.014 (ref 1.005–1.030)
pH: 5 (ref 5.0–8.0)

## 2019-04-16 LAB — RENAL FUNCTION PANEL
Albumin: 2.7 g/dL — ABNORMAL LOW (ref 3.5–5.0)
Anion gap: 14 (ref 5–15)
BUN: 72 mg/dL — ABNORMAL HIGH (ref 8–23)
CO2: 25 mmol/L (ref 22–32)
Calcium: 8.1 mg/dL — ABNORMAL LOW (ref 8.9–10.3)
Chloride: 100 mmol/L (ref 98–111)
Creatinine, Ser: 3.5 mg/dL — ABNORMAL HIGH (ref 0.61–1.24)
GFR calc Af Amer: 18 mL/min — ABNORMAL LOW (ref >60)
GFR calc non Af Amer: 16 mL/min — ABNORMAL LOW (ref >60)
Glucose, Bld: 256 mg/dL — ABNORMAL HIGH (ref 70–99)
Phosphorus: 6.4 mg/dL — ABNORMAL HIGH (ref 2.5–4.6)
Potassium: 4.9 mmol/L (ref 3.5–5.1)
Sodium: 139 mmol/L (ref 135–145)

## 2019-04-16 LAB — PROTIME-INR
INR: 4.3 (ref 0.8–1.2)
Prothrombin Time: 40.9 seconds — ABNORMAL HIGH (ref 11.4–15.2)

## 2019-04-16 LAB — PROTEIN / CREATININE RATIO, URINE
Creatinine, Urine: 168.84 mg/dL
Protein Creatinine Ratio: 0.21 mg/mg{Cre} — ABNORMAL HIGH (ref 0.00–0.15)
Total Protein, Urine: 36 mg/dL

## 2019-04-16 LAB — GLUCOSE, CAPILLARY
Glucose-Capillary: 225 mg/dL — ABNORMAL HIGH (ref 70–99)
Glucose-Capillary: 266 mg/dL — ABNORMAL HIGH (ref 70–99)
Glucose-Capillary: 321 mg/dL — ABNORMAL HIGH (ref 70–99)
Glucose-Capillary: 225 mg/dL — ABNORMAL HIGH (ref 70–99)

## 2019-04-16 MED ORDER — AMOXICILLIN-POT CLAVULANATE 500-125 MG PO TABS
1.0000 | ORAL_TABLET | Freq: Two times a day (BID) | ORAL | Status: DC
  Administered 2019-04-16 – 2019-04-17 (×4): 500 mg via ORAL
  Filled 2019-04-16 (×4): qty 1

## 2019-04-16 MED ORDER — CHLORHEXIDINE GLUCONATE CLOTH 2 % EX PADS
6.0000 | MEDICATED_PAD | Freq: Every day | CUTANEOUS | Status: DC
  Administered 2019-04-16 – 2019-04-23 (×8): 6 via TOPICAL

## 2019-04-16 MED ORDER — LEVETIRACETAM 500 MG PO TABS
500.0000 mg | ORAL_TABLET | Freq: Two times a day (BID) | ORAL | Status: DC
  Administered 2019-04-16 – 2019-04-17 (×3): 500 mg via ORAL
  Filled 2019-04-16 (×3): qty 1

## 2019-04-16 MED ORDER — GUAIFENESIN 100 MG/5ML PO SOLN
15.0000 mL | Freq: Three times a day (TID) | ORAL | Status: DC
  Administered 2019-04-16 – 2019-04-25 (×28): 300 mg via ORAL
  Filled 2019-04-16: qty 15
  Filled 2019-04-16: qty 5
  Filled 2019-04-16 (×2): qty 15
  Filled 2019-04-16: qty 5
  Filled 2019-04-16 (×6): qty 15
  Filled 2019-04-16: qty 5
  Filled 2019-04-16: qty 15
  Filled 2019-04-16: qty 5
  Filled 2019-04-16 (×4): qty 15
  Filled 2019-04-16: qty 5
  Filled 2019-04-16: qty 15
  Filled 2019-04-16: qty 10
  Filled 2019-04-16: qty 5
  Filled 2019-04-16 (×2): qty 15
  Filled 2019-04-16: qty 5
  Filled 2019-04-16 (×4): qty 15

## 2019-04-16 NOTE — Progress Notes (Signed)
PROGRESS NOTE    Noah [name]  VFI:[id number] DOB: 21-Jun-1940 DOA: 04/10/2019 PCP: Estanislado Pandy, MD    Brief Narrative:  78 year old male with a history of diabetes, hypertension, obstructive sleep apnea, chronic kidney disease stage III, seizure disorder, admitted to the hospital with 1 day of confusion.  Basic work-up including CT head, blood cultures were found to be unrevealing.  He remains confused.  Further work-up in process.   Assessment & Plan:   Principal Problem:   Acute encephalopathy Active Problems:   DM (diabetes mellitus), type 2 with complications (HCC)   Chronic systolic (congestive) heart failure (HCC)   CAD S/P percutaneous coronary angioplasty   Atrial fibrillation (HCC)   CKD (chronic kidney disease) stage 3, GFR 30-59 ml/min   OSA (obstructive sleep apnea)   Epilepsy (HCC)   1)Acute encephalopathy-- He is noted to have mild dysarthria, but no acute focal neurologic deficits.  No significant hypercarbia on blood gas.  Urinalysis, TSH, ammonia, B12 and RPR unrevealing.  Cannot perform MRI due to presence of pacemaker.  EEG did not show any epileptiform discharges. Wife does not report any recent medication changes and it is unlikely that he took any extra medications.  Patient had hypotension.  Likely related to volume depletion in the setting of antihypertensive medications.   Metoprolol, Lasix and Entresto have been discontinued.  He received IV fluid boluses.    2)Coronary artery disease.  No complaints of chest pain.  Continue aspirin, statin and beta-blocker  3)Pneumonia--.  Patient noted to have small infiltrate at left base.  He does appear to be mildly short of breath and reports having a cough.--Cannot rule out aspiration component, okay to continue Augmentin.   4)Chronic systolic CHF.  EF 40-45%. Holding lasix and Entresto due to elevated creatinine.   5)Insulin dependent diabetes.  A1c was 8.7 earlier this month.  Continue on sliding scale for  now.  6)AKI on chronic kidney disease stage III.  Baseline creatinine of 1.6..   Hold Lasix, hold Entresto, hold Metformin, renal ultrasound requested  -Nephrology consult appreciated -Creatinine is up to 3.5 per nephrologist okay to discontinue IV fluids  7)Paroxysmal atrial fibrillation.  Rate controlled with metoprolol.  Anticoagulated with Coumadin.  8)Epilepsy.  Continue on Keppra and Neurontin--dose adjusted for renal function  9)Obstructive sleep apnea.  Continue supplemental O2 at night.  Continue CPAP.  10)FEN--speech pathologist recommends regular solids and thin liquids   DVT prophylaxis: Warfarin Code Status: Full code Family Communication: Discussed with wife Disposition Plan: -SNF rehab  Consultants:     Procedures:     Antimicrobials:      Subjective: -Resting, able to answer simple questions -Had possible choking episode while trying to eat this morning --- Speech pathologist evaluation appreciated  Objective: Vitals:   04/16/19 0608 04/16/19 0718 04/16/19 1406 04/16/19 1408  BP: (!) 114/93  (!) 82/65 (!) 101/50  Pulse: 72  73 71  Resp: 19  17 18   Temp: 98 F (36.7 C)  98.1 F (36.7 C)   TempSrc:      SpO2: 100% 96% 99% 100%  Weight:      Height:        Intake/Output Summary (Last 24 hours) at 04/16/2019 1901 Last data filed at 04/16/2019 1800 Gross per 24 hour  Intake 360 ml  Output 650 ml  Net -290 ml   Filed Weights   04/13/19 0617 04/14/19 0500 04/16/19 0500  Weight: 125.3 kg 126.2 kg 126.2 kg    Examination:  General  exam: No distress, he is lethargic, but wakes up to voice Respiratory system: Clear to auscultation. Respiratory effort normal. Cardiovascular system: Irregular. No murmurs, rubs, gallops. Gastrointestinal system: Abdomen is nondistended, soft and nontender. No organomegaly or masses felt. Normal bowel sounds heard. Central nervous system:  No focal neurological deficits. Extremities: 1+ edema bilaterally Skin:  No rashes, lesions or ulcers Psychiatry: Somnolent, wakes up to voice  Data Reviewed: I have personally reviewed following labs and imaging studies  CBC: Recent Labs  Lab 04/12/19 0530 04/13/19 0556 04/14/19 0555 04/15/19 0603 04/16/19 0625  WBC 8.2 10.3 11.9* 9.6 9.3  HGB 10.4* 10.8* 10.2* 10.8* 9.6*  HCT 35.2* 36.5* 35.6* 38.1* 33.6*  MCV 95.1 94.6 96.2 98.2 98.2  PLT 187 186 165 150 108*   Basic Metabolic Panel: Recent Labs  Lab 04/12/19 0530 04/13/19 0556 04/14/19 0555 04/15/19 0603 04/16/19 0625  NA 146* 146* 142 141 139  K 4.5 4.3 4.7 5.1 4.9  CL 105 104 101 105 100  CO2 28 28 31 23 25   GLUCOSE 111* 118* 237* 176* 256*  BUN 42* 48* 59* 67* 72*  CREATININE 1.80* 2.12* 2.41* 3.29* 3.50*  CALCIUM 9.4 9.1 8.6* 8.2* 8.1*  MG  --   --   --  1.9  --   PHOS  --   --   --   --  6.4*   GFR: Estimated Creatinine Clearance: 22.5 mL/min (A) (by C-G formula based on SCr of 3.5 mg/dL (H)). Liver Function Tests: Recent Labs  Lab 04/10/19 1954 04/11/19 0512 04/16/19 0625  AST 15 14*  --   ALT 15 13  --   ALKPHOS 68 61  --   BILITOT 0.6 0.8  --   PROT 6.9 6.2*  --   ALBUMIN 3.3* 2.8* 2.7*   No results for input(s): LIPASE, AMYLASE in the last 168 hours. Recent Labs  Lab 04/10/19 1954  AMMONIA 34   Coagulation Profile: Recent Labs  Lab 04/12/19 0530 04/13/19 0556 04/14/19 0555 04/15/19 0603 04/16/19 0625  INR 2.1* 2.6* 3.7* 4.1* 4.3*   Cardiac Enzymes: Recent Labs  Lab 04/10/19 1954  CKTOTAL 74   BNP (last 3 results) No results for input(s): PROBNP in the last 8760 hours. HbA1C: No results for input(s): HGBA1C in the last 72 hours. CBG: Recent Labs  Lab 04/15/19 1719 04/15/19 2034 04/16/19 0713 04/16/19 1104 04/16/19 1559  GLUCAP 244* 263* 225* 266* 321*   Lipid Profile: No results for input(s): CHOL, HDL, LDLCALC, TRIG, CHOLHDL, LDLDIRECT in the last 72 hours. Thyroid Function Tests: No results for input(s): TSH, T4TOTAL, FREET4, T3FREE,  THYROIDAB in the last 72 hours. Anemia Panel: No results for input(s): VITAMINB12, FOLATE, FERRITIN, TIBC, IRON, RETICCTPCT in the last 72 hours. Sepsis Labs: Recent Labs  Lab 04/10/19 1954  LATICACIDVEN 0.8    Recent Results (from the past 240 hour(s))  SARS CORONAVIRUS 2 (TAT 6-24 HRS) Nasopharyngeal Nasopharyngeal Swab     Status: None   Collection Time: 04/10/19 10:20 PM   Specimen: Nasopharyngeal Swab  Result Value Ref Range Status   SARS Coronavirus 2 NEGATIVE NEGATIVE Final    Comment: (NOTE) SARS-CoV-2 target nucleic acids are NOT DETECTED. The SARS-CoV-2 RNA is generally detectable in upper and lower respiratory specimens during the acute phase of infection. Negative results do not preclude SARS-CoV-2 infection, do not rule out co-infections with other pathogens, and should not be used as the sole basis for treatment or other patient management decisions. Negative results must be combined  with clinical observations, patient history, and epidemiological information. The expected result is Negative. Fact Sheet for Patients: SugarRoll.be Fact Sheet for Healthcare Providers: https://www.woods-mathews.com/ This test is not yet approved or cleared by the [place] FDA and  has been authorized for detection and/or diagnosis of SARS-CoV-2 by FDA under an Emergency Use Authorization (EUA). This EUA will remain  in effect (meaning this test can be used) for the duration of the COVID-19 declaration under Section 56 4(b)(1) of the Act, 21 U.S.C. section 360bbb-3(b)(1), unless the authorization is terminated or revoked sooner. Performed at [facility], [place] [address], [place], [place] [address]   Urine culture     Status: Abnormal   Collection Time: 04/11/19 12:20 AM   Specimen: Urine, Random  Result Value Ref Range Status   Specimen Description   Final    URINE, RANDOM Performed at [facility], [address]  [place], [place] [address]    Special Requests   Final    NONE Performed at [facility], [address] [place], [place] [address]    Culture (A)  Final    <10,000 COLONIES/mL INSIGNIFICANT GROWTH Performed at [facility], [place] [address], [place], [place] [address]    Report Status 04/12/2019 FINAL  Final    Radiology Studies: US RENAL  Result Date: 04/16/2019 CLINICAL DATA:  Acute kidney injury. EXAM: RENAL / URINARY TRACT ULTRASOUND COMPLETE COMPARISON:  None. FINDINGS: Right Kidney: Renal measurements: 11.0 x 8.4 x 7.9 cm = volume: 379 mL . Echogenicity within normal limits. No mass or hydronephrosis visualized. Left Kidney: Renal measurements: 12.2 x 7.9 x 8.6 cm = volume: 433 mL. Echogenicity within normal limits. No mass or hydronephrosis visualized. Bladder: Bilateral ureteral jets visible. Other: None. IMPRESSION: Bilateral renal enlargement by overall measurements. Large amount of fat in the renal hilar regions, probably artificially increasing the actual renal size. There is no hydronephrosis. There may be some renal enlargement component due to acute nephritis or diabetic glomerular nephropathy. Electronically Signed   By: Nelson Chimes M.D.   On: 04/16/2019 10:36   Scheduled Meds: . amoxicillin-clavulanate  1 tablet Oral BID  . aspirin EC  81 mg Oral Daily  . atorvastatin  40 mg Oral q1800  . Chlorhexidine Gluconate Cloth  6 each Topical Daily  . famotidine  20 mg Oral QHS  . finasteride  5 mg Oral QPM  . FLUoxetine  20 mg Oral QPM  . gabapentin  100 mg Oral Daily  . gabapentin  200 mg Oral QHS  . guaiFENesin  15 mL Oral TID  . insulin aspart  0-9 Units Subcutaneous TID WC  . levETIRAcetam  500 mg Oral BID  . nystatin   Topical BID  . pantoprazole  40 mg Oral Daily  . sodium chloride flush  3 mL Intravenous Q12H  . sodium chloride flush  3 mL Intravenous Q12H  . tamsulosin  0.8 mg Oral QPM  . Warfarin - Pharmacist Dosing Inpatient   Does not apply q1800   Continuous  Infusions: . sodium chloride       LOS: 4 days   Roxan Hockey, MD [facility]   If 7PM-7AM, please contact night-coverage [email or web address]  04/16/2019, 7:01 PM

## 2019-04-16 NOTE — Evaluation (Signed)
Physical Therapy Evaluation Patient Details Name: Noah [name] MRN: [id number] DOB: 1940/06/23 Today's Date: 04/16/2019   History of Present Illness  Noah [name] is a 78 y.o. male with medical history significant for CAD, ischemic cardiomyopathy, insulin-dependent diabetes mellitus, OSA, chronic 3 L/min supplemental oxygen requirement, chronic kidney disease stage III, and seizures, presenting to the emergency department with 1 day of confusion.  Patient was reportedly in his usual state yesterday which is alert and fully oriented, but has been confused today per report of his family.  The patient also acknowledges feeling confused, but denies any headache, change in vision or hearing, or new focal numbness or weakness.  There has not been any recent fall or trauma reported and the patient is not known to use alcohol or illicit substances.    Clinical Impression  Patient limited for functional mobility as stated below secondary to BLE weakness, fatigue and poor standing balance.  Patient has much difficulty sitting up at bedside, had to be pulled to sitting and limited to a few steps at bedside. Patient will benefit from continued physical therapy in hospital and recommended venue below to increase strength, balance, endurance for safe ADLs and gait.     Follow Up Recommendations SNF;Supervision for mobility/OOB;Supervision - Intermittent    Equipment Recommendations  None recommended by PT    Recommendations for Other Services       Precautions / Restrictions Precautions Precautions: Fall Restrictions Weight Bearing Restrictions: No      Mobility  Bed Mobility Overal bed mobility: Needs Assistance Bed Mobility: Supine to Sit     Supine to sit: Mod assist;Max assist     General bed mobility comments: vert slow labored movement with frequent falling backwards to rest  Transfers Overall transfer level: Needs assistance Equipment used: Rolling walker (2  wheeled) Transfers: Sit to/from [employer] Sit to Stand: Min assist Stand pivot transfers: Min assist;Mod assist       General transfer comment: slow labored movement  Ambulation/Gait Ambulation/Gait assistance: Min assist;Mod assist Gait Distance (Feet): 5 Feet Assistive device: Rolling walker (2 wheeled) Gait Pattern/deviations: Decreased step length - right;Decreased step length - left;Decreased stride length Gait velocity: slow   General Gait Details: limited to 5-6 slow labored steps at bedside due to c/o fatigue  Stairs            Wheelchair Mobility    Modified Rankin (Stroke Patients Only)       Balance Overall balance assessment: Needs assistance Sitting-balance support: Feet supported;No upper extremity supported Sitting balance-Leahy Scale: Fair Sitting balance - Comments: fair/good seated at bedside   Standing balance support: During functional activity;Bilateral upper extremity supported Standing balance-Leahy Scale: Fair Standing balance comment: using RW                             Pertinent Vitals/Pain Pain Assessment: Faces Faces Pain Scale: Hurts even more Pain Location: at site of foley catheter insertion Pain Descriptors / Indicators: Sore Pain Intervention(s): Limited activity within patient's tolerance;Monitored during session    Home Living Family/patient expects to be discharged to:: Private residence Living Arrangements: Spouse/significant other Available Help at Discharge: Family;Available 24 hours/day Type of Home: House Home Access: Stairs to enter Entrance Stairs-Rails: None Entrance Stairs-Number of Steps: 2 Home Layout: Two level;Bed/bath upstairs Home Equipment: Cane - single point;Walker - 4 wheels      Prior Function Level of Independence: Independent with assistive device(s)  Comments: [place] ambulator with SPC, does not drive     Hand Dominance   Dominant Hand: Right     Extremity/Trunk Assessment   Upper Extremity Assessment Upper Extremity Assessment: Generalized weakness    Lower Extremity Assessment Lower Extremity Assessment: Generalized weakness    Cervical / Trunk Assessment Cervical / Trunk Assessment: Normal  Communication   Communication: HOH  Cognition Arousal/Alertness: Awake/alert Behavior During Therapy: WFL for tasks assessed/performed Overall Cognitive Status: Within Functional Limits for tasks assessed                                        General Comments      Exercises     Assessment/Plan    PT Assessment Patient needs continued PT services  PT Problem List Decreased strength;Decreased activity tolerance;Decreased balance;Decreased mobility       PT Treatment Interventions Balance training;Gait training;Stair training;Functional mobility training;Therapeutic activities;Therapeutic exercise;Patient/family education    PT Goals (Current goals can be found in the Care Plan section)  Acute Rehab PT Goals Patient Stated Goal: return home with family to assist PT Goal Formulation: With patient Time For Goal Achievement: 04/30/19 Potential to Achieve Goals: Good    Frequency Min 3X/week   Barriers to discharge        Co-evaluation               AM-PAC PT "6 Clicks" Mobility  Outcome Measure Help needed turning from your back to your side while in a flat bed without using bedrails?: A Lot Help needed moving from lying on your back to sitting on the side of a flat bed without using bedrails?: A Lot Help needed moving to and from a bed to a chair (including a wheelchair)?: A Lot Help needed standing up from a chair using your arms (e.g., wheelchair or bedside chair)?: A Little Help needed to walk in hospital room?: A Lot Help needed climbing 3-5 steps with a railing? : A Lot 6 Click Score: 13    End of Session   Activity Tolerance: Patient tolerated treatment well;Patient limited by  fatigue Patient left: in chair;with call bell/phone within reach;with chair alarm set Nurse Communication: Mobility status PT Visit Diagnosis: Unsteadiness on feet (R26.81);Other abnormalities of gait and mobility (R26.89);Muscle weakness (generalized) (M62.81)    Time: 8242-3536 PT Time Calculation (min) (ACUTE ONLY): 29 min   Charges:   PT Evaluation $PT Eval Moderate Complexity: 1 Mod PT Treatments $Therapeutic Activity: 23-37 mins        3:38 PM, 04/16/19 Lonell Grandchild, MPT Physical Therapist with [facility] [phone] office [phone] mobile phone

## 2019-04-16 NOTE — Consult Note (Addendum)
[facility] Renal Consultation Note  Requesting MD: Kathie Dike, MD  Indication for Consultation: Acute renal failure  Chief complaint: Altered mental status  HPI:  Noah [name] is a 78 y.o. male with a history of diabetes, hypertension, obstructive sleep apnea, seizure disorder and CKD stage III who presented to the hospital with a 1 day history of confusion.  He had a CT head which was negative for acute process.  He was found to have developed acute renal failure over course of hospitalization with creatinine trends as below.  Cr was at his approximate baseline of 1.60 on 12/22 and steadily increased to 3.5 on the date of consult, 12/28.  Note had straight cath for 400 mL on 12/27.  Home meds include entresto and lasix; lasix was stopped 12/25 and entresto stopped 12/26.  He has a history of HTN but has experienced hypotension here.  Intake UA 100 mg/dL protein and > 50 RBC.   Has been on both tamsulosin and finasteride; he states that he has had to have a Foley catheter before.  He is conversant and does have some delay in answering questions but answers.  He would want dialysis if indicated.  His wife is worried about the effect on his heart but would want dialysis if he needed it emergently.  He states that he takes acetaminophen at home for pain.  His wife makes his medical decisions when he is not able to.  His brother has kidney failure to some degree however is not on dialysis.  Didn't have strict ins/outs but had 400 mL UOP and 3 uncharted voids.   Creatinine, Ser  Date/Time Value Ref Range Status  04/16/2019 06:25 AM 3.50 (H) 0.61 - 1.24 mg/dL Final  04/15/2019 06:03 AM 3.29 (H) 0.61 - 1.24 mg/dL Final  04/14/2019 05:55 AM 2.41 (H) 0.61 - 1.24 mg/dL Final  04/13/2019 05:56 AM 2.12 (H) 0.61 - 1.24 mg/dL Final  04/12/2019 05:30 AM 1.80 (H) 0.61 - 1.24 mg/dL Final  04/11/2019 05:12 AM 1.57 (H) 0.61 - 1.24 mg/dL Final  04/10/2019 07:54 PM 1.60 (H) 0.61 - 1.24 mg/dL  Final  03/31/2019 07:07 AM 1.81 (H) 0.61 - 1.24 mg/dL Final  03/30/2019 06:44 AM 1.67 (H) 0.61 - 1.24 mg/dL Final  03/29/2019 03:25 AM 1.61 (H) 0.61 - 1.24 mg/dL Final  03/28/2019 04:25 PM 1.64 (H) 0.61 - 1.24 mg/dL Final  05/23/2018 05:12 AM 1.55 (H) 0.61 - 1.24 mg/dL Final  05/22/2018 03:35 AM 1.63 (H) 0.61 - 1.24 mg/dL Final  05/21/2018 04:59 AM 2.05 (H) 0.61 - 1.24 mg/dL Final  05/20/2018 05:47 AM 1.78 (H) 0.61 - 1.24 mg/dL Final  05/19/2018 06:07 AM 1.38 (H) 0.61 - 1.24 mg/dL Final  05/17/2018 10:28 PM 1.40 (H) 0.61 - 1.24 mg/dL Final  01/17/2018 03:35 PM 1.62 (H) 0.61 - 1.24 mg/dL Final  06/20/2012 02:55 PM 1.5 0.4 - 1.5 mg/dL Final  09/09/2007 03:00 AM 1.19  Final     PMHx:   Past Medical History:  Diagnosis Date  . CAD S/P percutaneous coronary angioplasty    remote PCIs in 1990's- last CFX PCI 2008  . Chronic kidney disease, stage III (moderate)   . Diabetes mellitus with complication (HCC)    with hyperglycemia and diabetic autonomic poly neuropathy  . Gastro-esophageal reflux disease with esophagitis   . Hyperlipidemia   . Hypertension   . Ischemic cardiomyopathy 01/20/2018   St Jude ICD   . Morbid obesity ([place])   . Obstructive sleep apnea  on CPAP  . Polyneuropathy   . Primary insomnia     Past Surgical History:  Procedure Laterality Date  . BIV ICD INSERTION CRT-D N/A 01/20/2018   Procedure: BIV ICD INSERTION CRT-D;  Surgeon: Marinus Maw, MD;  Location: [facility] INVASIVE CV LAB;  Service: Cardiovascular;  Laterality: N/A;  . BIV PACEMAKER INSERTION CRT-P  01/20/2018   St Jude- Dr Ladona Ridgel  . CARDIAC CATHETERIZATION     multiple angioplasty X 8, 4 stents     Family Hx:  Family History  Problem Relation Age of Onset  . Heart attack Father   . Breast cancer Mother   . CAD Mother   . Cancer Brother     Social History:  reports that he quit smoking about 18 years ago. He has a 30.00 pack-year smoking history. He has never used smokeless tobacco. He  reports current alcohol use. He reports that he does not use drugs.  Allergies:  Allergies  Allergen Reactions  . Codeine Other (See Comments)    constipation  . Erythromycin-Sulfisoxazole Other (See Comments)    Causes infection in throat and eyes    Medications: Prior to Admission medications   Medication Sig Start Date End Date Taking? Authorizing Provider  ENTRESTO 49-51 MG TAKE 1 TABLET BY MOUTH TWICE A DAY 04/03/19  Yes Laqueta Linden, MD  famotidine (PEPCID) 20 MG tablet Take 20 mg by mouth at bedtime. 08/15/18  Yes [provider]  finasteride (PROSCAR) 5 MG tablet Take 5 mg by mouth every evening.  08/13/17  Yes [provider]  FLUoxetine (PROZAC) 20 MG capsule Take 20 mg by mouth every evening.   Yes [provider]  furosemide (LASIX) 40 MG tablet Take 1-2 tablets (40-80 mg total) by mouth See admin instructions. Take 2 tabs (80mg ) by mouth every morning & 1 tab (40mg ) every afternoon 04/01/19  Yes Johnson, Clanford L, MD  gabapentin (NEURONTIN) 100 MG capsule Take 100-200 mg by mouth See admin instructions. 100mg  in the morning and 200mg  at bedtime 10/05/17  Yes [provider]  Insulin Glargine-Lixisenatide (SOLIQUA) 100-33 UNT-MCG/ML SOPN Inject 30 Units into the skin every evening. 03/31/19  Yes Johnson, Clanford L, MD  levETIRAcetam (KEPPRA) 750 MG tablet Take 750 mg by mouth 2 (two) times daily. 07/10/18  Yes [provider]  metoprolol succinate (TOPROL-XL) 100 MG 24 hr tablet Take 100 mg by mouth daily. 01/02/19  Yes [provider]  omeprazole (PRILOSEC) 20 MG capsule Take 20 mg by mouth daily.   Yes [provider]  simvastatin (ZOCOR) 80 MG tablet TAKE 80 MG (1 TABLET) DAILY. Patient taking differently: Take 80 mg by mouth daily.  11/15/18  Yes Laqueta Linden, MD  Tamsulosin HCl (FLOMAX) 0.4 MG CAPS Take 0.8 mg by mouth every evening.   Yes [provider]  warfarin (COUMADIN) 5 MG tablet  Take 0.5-1 tablets (2.5-5 mg total) by mouth See admin instructions. Take 1/2 tablet (2.5mg ) daily except 1 tablet (5mg ) on Tuesdays, Thursdays and Saturdays 03/31/19  Yes Johnson, Clanford L, MD  acetaminophen (TYLENOL) 650 MG CR tablet Take 650-1,300 mg by mouth daily as needed for pain.     [provider]  aspirin EC 81 MG tablet Take 1 tablet (81 mg total) by mouth daily. 10/10/17   Laqueta Linden, MD  loratadine (ALLERGY RELIEF) 10 MG tablet Take 10 mg by mouth daily as needed for allergies.    [provider]  metFORMIN (GLUCOPHAGE-XR) 500 MG 24  hr tablet Take 1,500 mg by mouth daily.    [provider]  nystatin cream (MYCOSTATIN) Apply 1 application topically daily as needed for dry skin.    [provider]  Omega-3 Fatty Acids (FISH OIL) 1000 MG CAPS Take 1,000 mg by mouth 2 (two) times daily.     [provider]    I have reviewed the patient's current medications.  Labs:  BMP Latest Ref Rng & Units 04/16/2019 04/15/2019 04/14/2019  Glucose 70 - 99 mg/dL 782(N) 562(Z) 308(M)  BUN 8 - 23 mg/dL 57(Q) 46(N) 62(X)  Creatinine 0.61 - 1.24 mg/dL 5.28(U) 1.32(G) 4.01(U)  Sodium 135 - 145 mmol/L 139 141 142  Potassium 3.5 - 5.1 mmol/L 4.9 5.1 4.7  Chloride 98 - 111 mmol/L 100 105 101  CO2 22 - 32 mmol/L 25 23 31   Calcium 8.9 - 10.3 mg/dL 8.1(L) 8.2(L) 8.6(L)    Urinalysis    Component Value Date/Time   COLORURINE STRAW (A) 04/11/2019 0020   APPEARANCEUR CLEAR 04/11/2019 0020   LABSPEC 1.010 04/11/2019 0020   PHURINE 5.0 04/11/2019 0020   GLUCOSEU NEGATIVE 04/11/2019 0020   HGBUR MODERATE (A) 04/11/2019 0020   BILIRUBINUR NEGATIVE 04/11/2019 0020   KETONESUR NEGATIVE 04/11/2019 0020   PROTEINUR 100 (A) 04/11/2019 0020   NITRITE NEGATIVE 04/11/2019 0020   LEUKOCYTESUR TRACE (A) 04/11/2019 0020     ROS:  Pertinent items noted in HPI and remainder of comprehensive ROS otherwise negative. however may be limited secondary to  AMS  Physical Exam: Vitals:   04/15/19 2142 04/16/19 0608  BP: (!) 83/56 (!) 114/93  Pulse: 61 72  Resp: 18 19  Temp: 97.8 F (36.6 C) 98 F (36.7 C)  SpO2: 100% 100%     General: Elderly male in bed in no acute distress at rest HEENT: Normocephalic atraumatic Eyes: Extraocular movements intact sclera anicteric Neck: Supple trachea midline Heart: S1-S2 no rub appreciated Lungs: on 2.5 - 3 liters oxygen  Abdomen: Soft nontender distended/obese habitus; normal bowel sounds Extremities: no lower extremity edema; no clubbing    Skin: No rash on extremities exposed Neuro: Patient is oriented to person and location and basic events knows it is December 2020; able to come up with year after some delay  Assessment/Plan:  # AKI  - Secondary in part to ATN from hypotension; also noted retention with 400 mL in/out cath on 12/27.  Over 50 RBC noted on intake UA - Will obtain a renal ultrasound  - Will discontinue LR; can use low dose normal saline bolus if needed for hypotension given CHF (ie. 250 mL once and reassess)   - Given retention and AKI will place foley catheter  - Check repeat UA and obtain up/cr ratio  - Hold entresto for now  - Hold lasix for now unless needed for resp distress  - Hold metformin  - Changed to renal diabetic diet  # CKD stage III  - baseline Cr near 1.6  # AMS - Improved  - work-up per primary team  - Given AKI would provide no more than 300 mg gabapentin daily however given confusion would discontinue gabapentin per primary team discretion   # Hypotension - hx HTN with hypotension  - Improved on last check at 114/93 this AM - Can use low dose normal saline bolus if needed for hypotension given CHF (ie. 250 mL once and reassess)   # Ischemic cardiomyopathy with chronic systolic CHF - 04/2018 TTE with EF 40-45%  - Hold entresto  for now  - Hold lasix for now unless needed for resp distress    # Chronic hypoxic resp failure  - states has 2 liter  oxygen requirement at home   # Normocytic anemia - no acute indication for PRBC's    Estanislado EmmsLori C Josetta Wigal 04/16/2019, 9:38 AM

## 2019-04-16 NOTE — Progress Notes (Signed)
CRITICAL VALUE ALERT  Critical Value:  INR 4.3  Date & Time Notied:  04/16/2019 @ 0700  Provider Notified: Dr. Maurene Capes  Orders Received/Actions taken: awaiting orders.

## 2019-04-16 NOTE — Progress Notes (Signed)
ANTICOAGULATION CONSULT NOTE - Pharmacy Consult for Coumadin Indication: atrial fibrillation  Allergies  Allergen Reactions  . Codeine Other (See Comments)    constipation  . Erythromycin-Sulfisoxazole Other (See Comments)    Causes infection in throat and eyes    Patient Measurements: Height: 5\' 8"  (172.7 cm) Weight: 278 lb 3.5 oz (126.2 kg) IBW/kg (Calculated) : 68.4  Vital Signs: Temp: 98 F (36.7 C) (12/28 0608) BP: 114/93 (12/28 0608) Pulse Rate: 72 (12/28 0608)  Labs: Recent Labs    04/14/19 0555 04/15/19 0603 04/16/19 0625  HGB 10.2* 10.8* 9.6*  HCT 35.6* 38.1* 33.6*  PLT 165 150 108*  LABPROT 36.8* 40.1* 40.9*  INR 3.7* 4.1* 4.3*  CREATININE 2.41* 3.29* 3.50*    Estimated Creatinine Clearance: 22.5 mL/min (A) (by C-G formula based on SCr of 3.5 mg/dL (H)).   Medical History: Past Medical History:  Diagnosis Date  . CAD S/P percutaneous coronary angioplasty    remote PCIs in 1990's- last CFX PCI 2008  . Chronic kidney disease, stage III (moderate)   . Diabetes mellitus with complication (HCC)    with hyperglycemia and diabetic autonomic poly neuropathy  . Gastro-esophageal reflux disease with esophagitis   . Hyperlipidemia   . Hypertension   . Ischemic cardiomyopathy 01/20/2018   St Jude ICD   . Morbid obesity ([place])   . Obstructive sleep apnea    on CPAP  . Polyneuropathy   . Primary insomnia     Medications:  See electronic med rec  Assessment: 78 y.o. M presents with confusion. Pt on coumadin PTA for afib. Admission INR 2.3 (therapeutic). CBC ok on admission Home dose: 2.5mg  daily except for 5mg  on Tues/Thur/Sat  INR 2.1>> 2.6 >3.7>4.1>4.3   Goal of Therapy:  INR 2-3 Monitor platelets by anticoagulation protocol: Yes   Plan:  Hold warfarin today  Daily INR Monitor for S/S of bleeding  Margot Ables, PharmD Clinical Pharmacist 04/16/2019 1:23 PM

## 2019-04-16 NOTE — Evaluation (Signed)
Clinical/Bedside Swallow Evaluation Patient Details  Name: Noah [name] MRN: [id number] Date of Birth: [birth year]  Today's Date: 04/16/2019 Time: SLP Start Time (ACUTE ONLY): 3016 SLP Stop Time (ACUTE ONLY): 1453 SLP Time Calculation (min) (ACUTE ONLY): 31 min  Past Medical History:  Past Medical History:  Diagnosis Date  . CAD S/P percutaneous coronary angioplasty    remote PCIs in 1990's- last CFX PCI 2008  . Chronic kidney disease, stage III (moderate)   . Diabetes mellitus with complication (HCC)    with hyperglycemia and diabetic autonomic poly neuropathy  . Gastro-esophageal reflux disease with esophagitis   . Hyperlipidemia   . Hypertension   . Ischemic cardiomyopathy 01/20/2018   St Jude ICD   . Morbid obesity ([place])   . Obstructive sleep apnea    on CPAP  . Polyneuropathy   . Primary insomnia    Past Surgical History:  Past Surgical History:  Procedure Laterality Date  . BIV ICD INSERTION CRT-D N/A 01/20/2018   Procedure: BIV ICD INSERTION CRT-D;  Surgeon: Evans Lance, MD;  Location: [facility] CV LAB;  Service: Cardiovascular;  Laterality: N/A;  . BIV PACEMAKER INSERTION CRT-P  01/20/2018   St Jude- Dr Lovena Le  . CARDIAC CATHETERIZATION     multiple angioplasty X 8, 4 stents    HPI:  78 year old male with a history of diabetes, hypertension, obstructive sleep apnea, chronic kidney disease stage III, seizure disorder, admitted to the hospital with 1 day of confusion.  Basic work-up including CT head, blood cultures were found to be unrevealing.  He remains confused. RN reported that Pt was clearing his throat, coughing, and gagging after breakfast meal this AM and requested BSE.    Assessment / Plan / Recommendation Clinical Impression  Clinical swallow evaluaton completed. Oral motor examination is WNL. Pt denies difficulty swallowing typically, however reports recent coughing and gagging during meals since admission. Pt with increased body habitus. He reports  no recent bowel movement, which may be impacting digestion. Oral motor examination is WNL. He shows no overt signs or symptoms of aspiration during assessment, however he does endorses "eating quickly" at times and "gagging". Recommend continuing diet as ordered and SLP will check back tomorrow for diet tolerance. Will ask RN about last BM.  SLP Visit Diagnosis: Dysphagia, unspecified (R13.10)    Aspiration Risk  Mild aspiration risk    Diet Recommendation Regular;Thin liquid   Liquid Administration via: Straw;Cup Medication Administration: Whole meds with liquid Supervision: Patient able to self feed;Intermittent supervision to cue for compensatory strategies Compensations: Slow rate Postural Changes: Seated upright at 90 degrees;Remain upright for at least 30 minutes after po intake    Other  Recommendations Oral Care Recommendations: Oral care BID Other Recommendations: Clarify dietary restrictions   Follow up Recommendations None      Frequency and Duration min 2x/week  1 week       Prognosis Prognosis for Safe Diet Advancement: Good      Swallow Study   General Date of Onset: 04/16/19 HPI: 78 year old male with a history of diabetes, hypertension, obstructive sleep apnea, chronic kidney disease stage III, seizure disorder, admitted to the hospital with 1 day of confusion.  Basic work-up including CT head, blood cultures were found to be unrevealing.  He remains confused. RN reported that Pt was clearing his throat, coughing, and gagging after breakfast meal this AM and requested BSE.  Type of Study: Bedside Swallow Evaluation Diet Prior to this Study: Regular;Thin liquids Temperature Spikes Noted: No Respiratory  Status: Nasal cannula History of Recent Intubation: No Behavior/Cognition: Alert;Cooperative;Pleasant mood Oral Cavity Assessment: Within Functional Limits Oral Care Completed by SLP: No Oral Cavity - Dentition: Adequate natural dentition Vision: Functional for  self-feeding Self-Feeding Abilities: Able to feed self Patient Positioning: Upright in bed Baseline Vocal Quality: Normal Volitional Cough: Strong Volitional Swallow: Able to elicit    Oral/Motor/Sensory Function Overall Oral Motor/Sensory Function: Within functional limits   Ice Chips Ice chips: Within functional limits Presentation: Spoon   Thin Liquid Thin Liquid: Within functional limits Presentation: Cup;Self Fed;Straw    Nectar Thick Nectar Thick Liquid: Not tested   Honey Thick Honey Thick Liquid: Not tested   Puree Puree: Within functional limits Presentation: Spoon   Solid     Solid: Within functional limits Presentation: Self Fed     Thank you,  Havery Moros, CCC-SLP [phone]  Sayre Mazor 04/16/2019,3:00 PM

## 2019-04-16 NOTE — Plan of Care (Signed)
  Problem: Acute Rehab PT Goals(only PT should resolve) Goal: Pt Will Go Supine/Side To Sit Outcome: Progressing Flowsheets (Taken 04/16/2019 1539) Pt will go Supine/Side to Sit: with minimal assist Goal: Patient Will Transfer Sit To/From Stand Outcome: Progressing Flowsheets (Taken 04/16/2019 1539) Patient will transfer sit to/from stand:  with minimal assist  with min guard assist Goal: Pt Will Transfer Bed To Chair/Chair To Bed Outcome: Progressing Flowsheets (Taken 04/16/2019 1539) Pt will Transfer Bed to Chair/Chair to Bed: with min assist Goal: Pt Will Ambulate Outcome: Progressing Flowsheets (Taken 04/16/2019 1539) Pt will Ambulate:  50 feet  with minimal assist  with rolling walker  with cane   3:40 PM, 04/16/19 Lonell Grandchild, MPT Physical Therapist with [facility] [phone] office [phone] mobile phone

## 2019-04-16 NOTE — Progress Notes (Addendum)
Inpatient Diabetes Program Recommendations  AACE/ADA: New Consensus Statement on Inpatient Glycemic Control (2015)  Target Ranges:  Prepandial:   less than 140 mg/dL      Peak postprandial:   less than 180 mg/dL (1-2 hours)      Critically ill patients:  140 - 180 mg/dL   Results for Noah [name], Noah [name] (MRN [id number]) as of 04/16/2019 11:31  Ref. Range 04/15/2019 07:45 04/15/2019 11:50 04/15/2019 17:19 04/15/2019 20:34  Glucose-Capillary Latest Ref Range: 70 - 99 mg/dL 151 (H)  2 units NOVOLOG  180 (H)  2 units NOVOLOG  244 (H)  3 units NOVOLOG  263 (H)   Results for Noah [name], Noah [name] (MRN [id number]) as of 04/16/2019 11:31  Ref. Range 04/16/2019 07:13 04/16/2019 11:04  Glucose-Capillary Latest Ref Range: 70 - 99 mg/dL 225 (H)  3 units NOVOLOG  266 (H)    Admit with: AMS  History: DM, CKD  Home DM Meds: Soliqua (insulin glargine/lixisenatide) 30 units Daily       Metformin 1500 mg Daily  Current Orders: Novolog Sensitive Correction Scale/ SSI (0-9 units) TID AC    MD- Note patient takes Soliqua 30 units Daily at home.  This medication and dose provides patient with 30 units insulin glargine per day.  Please consider starting Lantus 15 units Daily (50% total amount of insulin patient gets at home)    --Will follow patient during hospitalization--  Wyn Quaker RN, MSN, CDE Diabetes Coordinator Inpatient Glycemic Control Team Team Pager: [phone] (8a-5p)

## 2019-04-17 DIAGNOSIS — R4182 Altered mental status, unspecified: Secondary | ICD-10-CM | POA: Diagnosis not present

## 2019-04-17 LAB — RENAL FUNCTION PANEL
Albumin: 2.7 g/dL — ABNORMAL LOW (ref 3.5–5.0)
Anion gap: 9 (ref 5–15)
BUN: 67 mg/dL — ABNORMAL HIGH (ref 8–23)
CO2: 26 mmol/L (ref 22–32)
Calcium: 7.7 mg/dL — ABNORMAL LOW (ref 8.9–10.3)
Chloride: 103 mmol/L (ref 98–111)
Creatinine, Ser: 2.65 mg/dL — ABNORMAL HIGH (ref 0.61–1.24)
GFR calc Af Amer: 26 mL/min — ABNORMAL LOW (ref >60)
GFR calc non Af Amer: 22 mL/min — ABNORMAL LOW (ref >60)
Glucose, Bld: 223 mg/dL — ABNORMAL HIGH (ref 70–99)
Phosphorus: 4.4 mg/dL (ref 2.5–4.6)
Potassium: 4.6 mmol/L (ref 3.5–5.1)
Sodium: 138 mmol/L (ref 135–145)

## 2019-04-17 LAB — GLUCOSE, CAPILLARY
Glucose-Capillary: 195 mg/dL — ABNORMAL HIGH (ref 70–99)
Glucose-Capillary: 181 mg/dL — ABNORMAL HIGH (ref 70–99)
Glucose-Capillary: 190 mg/dL — ABNORMAL HIGH (ref 70–99)
Glucose-Capillary: 219 mg/dL — ABNORMAL HIGH (ref 70–99)

## 2019-04-17 LAB — PROTIME-INR
INR: 3.2 — ABNORMAL HIGH (ref 0.8–1.2)
Prothrombin Time: 33 seconds — ABNORMAL HIGH (ref 11.4–15.2)

## 2019-04-17 MED ORDER — FUROSEMIDE 10 MG/ML IJ SOLN: 40.0000 mg | Freq: Once | INTRAMUSCULAR | Status: DC

## 2019-04-17 MED ORDER — FUROSEMIDE 10 MG/ML IJ SOLN
60.0000 mg | Freq: Once | INTRAMUSCULAR | Status: AC
  Administered 2019-04-17: 60 mg via INTRAVENOUS
  Filled 2019-04-17: qty 6

## 2019-04-17 MED ORDER — WARFARIN SODIUM 2 MG PO TABS
2.0000 mg | ORAL_TABLET | Freq: Once | ORAL | Status: AC
  Administered 2019-04-17: 2 mg via ORAL
  Filled 2019-04-17: qty 1

## 2019-04-17 NOTE — Progress Notes (Addendum)
ANTICOAGULATION CONSULT NOTE - Pharmacy Consult for Coumadin Indication: atrial fibrillation  Allergies  Allergen Reactions  . Codeine Other (See Comments)    constipation  . Erythromycin-Sulfisoxazole Other (See Comments)    Causes infection in throat and eyes    Patient Measurements: Height: 5\' 8"  (172.7 cm) Weight: 289 lb 3.9 oz (131.2 kg) IBW/kg (Calculated) : 68.4  Vital Signs: Temp: 97.5 F (36.4 C) (12/29 0556) Temp Source: Oral (12/29 0556) BP: 129/76 (12/29 0556) Pulse Rate: 81 (12/29 0556)  Labs: Recent Labs    04/15/19 0603 04/16/19 0625 04/17/19 0622  HGB 10.8* 9.6*  --   HCT 38.1* 33.6*  --   PLT 150 108*  --   LABPROT 40.1* 40.9* 33.0*  INR 4.1* 4.3* 3.2*  CREATININE 3.29* 3.50* 2.65*    Estimated Creatinine Clearance: 30.4 mL/min (A) (by C-G formula based on SCr of 2.65 mg/dL (H)).   Medical History: Past Medical History:  Diagnosis Date  . CAD S/P percutaneous coronary angioplasty    remote PCIs in 1990's- last CFX PCI 2008  . Chronic kidney disease, stage III (moderate)   . Diabetes mellitus with complication (HCC)    with hyperglycemia and diabetic autonomic poly neuropathy  . Gastro-esophageal reflux disease with esophagitis   . Hyperlipidemia   . Hypertension   . Ischemic cardiomyopathy 01/20/2018   St Jude ICD   . Morbid obesity ([place])   . Obstructive sleep apnea    on CPAP  . Polyneuropathy   . Primary insomnia     Medications:  See electronic med rec  Assessment: 78 y.o. M presents with confusion. Pt on coumadin PTA for afib. Admission INR 2.3 (therapeutic). CBC ok on admission Home dose: 2.5mg  daily except for 5mg  on Tues/Thur/Sat  INR 2.1>> 2.6 >3.7>4.1>4.3>3.2   Goal of Therapy:  INR 2-3 Monitor platelets by anticoagulation protocol: Yes   Plan:  Warfarin 2 mg x 1. Anticipate INR to be under 3 by this evening so low dose to prevent rapid drop tomorrow. Daily INR Monitor for S/S of bleeding  Margot Ables,  PharmD Clinical Pharmacist 04/17/2019 8:53 AM

## 2019-04-17 NOTE — Plan of Care (Signed)

## 2019-04-17 NOTE — Consult Note (Signed)
[facility] Noah [name] A. Noah Pilgrimoonquah, MD     [email or web address]          Clare CharonJeffrey E [name] is an 78 y.o. male.   ASSESSMENT/PLAN: 1. RESOLVING ENCEPHALOPATHY: Etiology is mostly unrevealing. However, given that the patient has a baseline history of epilepsy, it is conceivable the patient may have had a seizure that was not witnessed. I think for now, we should maintain his current seizure medications. 2. The patient likely has baseline cognitive impairment which is Mild to moderate.    This is a 78 year old white male who presents with acute altered mental changes. There appears to have been associated mild dysarthria but otherwise the presentation was nonfocal. Initial workup has been mostly unrevealing for metabolic cause. Initial CT scan was unrevealing. MRI was ordered but given cardiac pacing, this could not be done. The patient has gradually improved over the course of the evaluation but he still is disoriented. He cannot provide an adequate history as to why he is in the hospital or why he has the symptoms. He does not report much in the way of complaints at this time. He does relate that he has a history of seizures. He has had seizures for the last year. He has had about 89 seizures. The patient tells me that he seems to have generalized convulsion and is amnestic. He does not report having urinary or bowel incontinence but may sometimes have oral trauma. He has been placed on Keppra by his physician and tells me that his seizures response to the medications. Side effects not reported. The review of systems otherwise unremarkable.   GENERAL:   This is a pleasant male who is doing well at this time in no acute distress.  HEENT:  No trauma appreciated and supple.  ABDOMEN:  Obese  EXTREMITIES: No edema; there is significant arthritic changes noted throughout   BACK:  Normal  SKIN: Normal by inspection.    MENTAL STATUS:  He is awake and alert. He is somewhat oriented. Speech  is fluent and no dysarthria is appreciated. Patient states that is December 2019. He is not oriented to hospital or city. He is unable to provide a history as to his current medical condition.  CRANIAL NERVES: Pupils are equal, round and reactive to light and accomodation; extra ocular movements are full, there is no significant nystagmus; visual fields are full; upper and lower facial muscles are normal in strength and symmetric, there is no flattening of the nasolabial folds; tongue is midline; uvula is midline; shoulder elevation is normal.  MOTOR: Normal tone, bulk and strength; no pronator drift.  COORDINATION: Left finger to nose is normal, right finger to nose is normal, No rest tremor; no intention tremor; no postural tremor; no bradykinesia.  REFLEXES: Deep tendon reflexes are symmetrical and normal.   SENSATION: Normal pain.        Blood pressure (!) 145/69, pulse 87, temperature 97.9 F (36.6 C), temperature source Oral, resp. rate 18, height 5\' 8"  (1.727 m), weight 131.2 kg, SpO2 99 %.  Past Medical History:  Diagnosis Date  . CAD S/P percutaneous coronary angioplasty    remote PCIs in 1990's- last CFX PCI 2008  . Chronic kidney disease, stage III (moderate)   . Diabetes mellitus with complication (HCC)    with hyperglycemia and diabetic autonomic poly neuropathy  . Gastro-esophageal reflux disease with esophagitis   . Hyperlipidemia   . Hypertension   . Ischemic cardiomyopathy 01/20/2018   St Jude ICD   . Morbid  obesity ([place])   . Obstructive sleep apnea    on CPAP  . Polyneuropathy   . Primary insomnia     Past Surgical History:  Procedure Laterality Date  . BIV ICD INSERTION CRT-D N/A 01/20/2018   Procedure: BIV ICD INSERTION CRT-D;  Surgeon: Evans Lance, MD;  Location: [facility];  Service: Cardiovascular;  Laterality: N/A;  . BIV PACEMAKER INSERTION CRT-P  01/20/2018   St Jude- Dr Lovena Le  . CARDIAC CATHETERIZATION     multiple angioplasty X 8,  4 stents     Family History  Problem Relation Age of Onset  . Heart attack Father   . Breast cancer Mother   . CAD Mother   . Cancer Brother     Social History:  reports that he quit smoking about 18 years ago. He has a 30.00 pack-year smoking history. He has never used smokeless tobacco. He reports current alcohol use. He reports that he does not use drugs.  Allergies:  Allergies  Allergen Reactions  . Codeine Other (See Comments)    constipation  . Erythromycin-Sulfisoxazole Other (See Comments)    Causes infection in throat and eyes    Medications: Prior to Admission medications   Medication Sig Start Date End Date Taking? Authorizing Provider  acetaminophen (TYLENOL) 650 MG CR tablet Take 650-1,300 mg by mouth daily as needed for pain.    Yes [provider]  aspirin EC 81 MG tablet Take 1 tablet (81 mg total) by mouth daily. 10/10/17  Yes Herminio Commons, MD  cetirizine (ZYRTEC) 10 MG tablet Take 10 mg by mouth daily.   Yes [provider]  ENTRESTO 49-51 MG TAKE 1 TABLET BY MOUTH TWICE A DAY Patient taking differently: Take 1 tablet by mouth 2 (two) times daily.  04/03/19  Yes Herminio Commons, MD  famotidine (PEPCID) 20 MG tablet Take 20 mg by mouth at bedtime. 08/15/18  Yes [provider]  finasteride (PROSCAR) 5 MG tablet Take 5 mg by mouth every evening.  08/13/17  Yes [provider]  FLUoxetine (PROZAC) 20 MG capsule Take 20 mg by mouth every evening.   Yes [provider]  furosemide (LASIX) 40 MG tablet Take 1-2 tablets (40-80 mg total) by mouth See admin instructions. Take 2 tabs (80mg ) by mouth every morning & 1 tab (40mg ) every afternoon 04/01/19  Yes Johnson, Clanford L, MD  gabapentin (NEURONTIN) 100 MG capsule Take 100-200 mg by mouth See admin instructions. 100mg  in the morning and 200mg  at bedtime 10/05/17  Yes [provider]  Insulin Glargine-Lixisenatide (SOLIQUA) 100-33 UNT-MCG/ML SOPN Inject 30  Units into the skin every evening. 03/31/19  Yes Johnson, Clanford L, MD  levETIRAcetam (KEPPRA) 750 MG tablet Take 750 mg by mouth 2 (two) times daily. 07/10/18  Yes [provider]  metFORMIN (GLUCOPHAGE-XR) 500 MG 24 hr tablet Take 1,500 mg by mouth daily.   Yes [provider]  metoprolol succinate (TOPROL-XL) 100 MG 24 hr tablet Take 100 mg by mouth daily. 01/02/19  Yes [provider]  Omega-3 Fatty Acids (FISH OIL) 1000 MG CAPS Take 1,000 mg by mouth 2 (two) times daily.    Yes [provider]  omeprazole (PRILOSEC) 20 MG capsule Take 20 mg by mouth daily.   Yes [provider]  simvastatin (ZOCOR) 80 MG tablet TAKE 80 MG (1 TABLET) DAILY. Patient taking differently: Take 80 mg by mouth daily.  11/15/18  Yes Herminio Commons, MD  Tamsulosin HCl (  FLOMAX) 0.4 MG CAPS Take 0.8 mg by mouth every evening.   Yes [provider]  warfarin (COUMADIN) 5 MG tablet Take 0.5-1 tablets (2.5-5 mg total) by mouth See admin instructions. Take 1/2 tablet (2.5mg ) daily except 1 tablet (5mg ) on Tuesdays, Thursdays and Saturdays 03/31/19  Yes Johnson, Clanford L, MD    Scheduled Meds: . amoxicillin-clavulanate  1 tablet Oral BID  . aspirin EC  81 mg Oral Daily  . atorvastatin  40 mg Oral q1800  . Chlorhexidine Gluconate Cloth  6 each Topical Daily  . famotidine  20 mg Oral QHS  . finasteride  5 mg Oral QPM  . FLUoxetine  20 mg Oral QPM  . gabapentin  100 mg Oral Daily  . gabapentin  200 mg Oral QHS  . guaiFENesin  15 mL Oral TID  . insulin aspart  0-9 Units Subcutaneous TID WC  . levETIRAcetam  500 mg Oral BID  . nystatin   Topical BID  . pantoprazole  40 mg Oral Daily  . sodium chloride flush  3 mL Intravenous Q12H  . sodium chloride flush  3 mL Intravenous Q12H  . tamsulosin  0.8 mg Oral QPM  . Warfarin - Pharmacist Dosing Inpatient   Does not apply q1800   Continuous Infusions: . sodium chloride     PRN Meds:.sodium chloride,  acetaminophen **OR** acetaminophen, ondansetron **OR** ondansetron (ZOFRAN) IV, senna-docusate, sodium chloride flush     Results for orders placed or performed during the hospital encounter of 04/10/19 (from the past 48 hour(s))  Glucose, capillary     Status: Abnormal   Collection Time: 04/15/19  8:34 PM  Result Value Ref Range   Glucose-Capillary 263 (H) 70 - 99 mg/dL   Comment 1 Notify RN   Protime-INR     Status: Abnormal   Collection Time: 04/16/19  6:25 AM  Result Value Ref Range   Prothrombin Time 40.9 (H) 11.4 - 15.2 seconds   INR 4.3 (HH) 0.8 - 1.2    Comment: CRITICAL RESULT CALLED TO, READ BACK BY AND VERIFIED WITH: R. BONDURANT,RN @0701  04/16/2019 KAY (NOTE) INR goal varies based on device and disease states. Performed at [facility], [address] [place], [place] [address]   Renal function panel     Status: Abnormal   Collection Time: 04/16/19  6:25 AM  Result Value Ref Range   Sodium 139 135 - 145 mmol/L   Potassium 4.9 3.5 - 5.1 mmol/L   Chloride 100 98 - 111 mmol/L   CO2 25 22 - 32 mmol/L   Glucose, Bld 256 (H) 70 - 99 mg/dL   BUN 72 (H) 8 - 23 mg/dL   Creatinine, Ser 6.043.50 (H) 0.61 - 1.24 mg/dL   Calcium 8.1 (L) 8.9 - 10.3 mg/dL   Phosphorus 6.4 (H) 2.5 - 4.6 mg/dL   Albumin 2.7 (L) 3.5 - 5.0 g/dL   GFR calc non Af Amer 16 (L) >60 mL/min   GFR calc Af Amer 18 (L) >60 mL/min   Anion gap 14 5 - 15    Comment: Performed at [facility], [address] [place], [place] [address]  CBC in AM     Status: Abnormal   Collection Time: 04/16/19  6:25 AM  Result Value Ref Range   WBC 9.3 4.0 - 10.5 K/uL   RBC 3.42 (L) 4.22 - 5.81 MIL/uL   Hemoglobin 9.6 (L) 13.0 - 17.0 g/dL   HCT 11.933.6 (L) 14.739.0 - 82.952.0 %   MCV 98.2 80.0 -  100.0 fL   MCH 28.1 26.0 - 34.0 pg   MCHC 28.6 (L) 30.0 - 36.0 g/dL   RDW 90.9 31.1 - 21.6 %   Platelets 108 (L) 150 - 400 K/uL    Comment: PLATELET COUNT CONFIRMED BY SMEAR SPECIMEN CHECKED FOR CLOTS    nRBC 0.0 0.0 - 0.2 %    Comment:  Performed at [facility], [address], [place], [place] [address]  Glucose, capillary     Status: Abnormal   Collection Time: 04/16/19  7:13 AM  Result Value Ref Range   Glucose-Capillary 225 (H) 70 - 99 mg/dL  Urinalysis, Complete w Microscopic     Status: Abnormal   Collection Time: 04/16/19  9:52 AM  Result Value Ref Range   Color, Urine YELLOW YELLOW   APPearance HAZY (A) CLEAR   Specific Gravity, Urine 1.014 1.005 - 1.030   pH 5.0 5.0 - 8.0   Glucose, UA 50 (A) NEGATIVE mg/dL   Hgb urine dipstick LARGE (A) NEGATIVE   Bilirubin Urine NEGATIVE NEGATIVE   Ketones, ur NEGATIVE NEGATIVE mg/dL   Protein, ur 30 (A) NEGATIVE mg/dL   Nitrite NEGATIVE NEGATIVE   Leukocytes,Ua NEGATIVE NEGATIVE   RBC / HPF >50 (H) 0 - 5 RBC/hpf   WBC, UA 0-5 0 - 5 WBC/hpf   Bacteria, UA NONE SEEN NONE SEEN   Squamous Epithelial / LPF 0-5 0 - 5   Mucus PRESENT    Hyaline Casts, UA PRESENT     Comment: Performed at [facility], [address] [place], [place] [address]  Protein / creatinine ratio, urine     Status: Abnormal   Collection Time: 04/16/19  9:53 AM  Result Value Ref Range   Creatinine, Urine 168.84 mg/dL   Total Protein, Urine 36 mg/dL    Comment: NO NORMAL RANGE ESTABLISHED FOR THIS TEST   Protein Creatinine Ratio 0.21 (H) 0.00 - 0.15 mg/mg[Cre]    Comment: Performed at [facility], [address], [place], [place] [address]  Glucose, capillary     Status: Abnormal   Collection Time: 04/16/19 11:04 AM  Result Value Ref Range   Glucose-Capillary 266 (H) 70 - 99 mg/dL  Glucose, capillary     Status: Abnormal   Collection Time: 04/16/19  3:59 PM  Result Value Ref Range   Glucose-Capillary 321 (H) 70 - 99 mg/dL  Glucose, capillary     Status: Abnormal   Collection Time: 04/16/19  8:58 PM  Result Value Ref Range   Glucose-Capillary 225 (H) 70 - 99 mg/dL  Protime-INR     Status: Abnormal   Collection Time: 04/17/19  6:22 AM  Result Value Ref Range   Prothrombin Time 33.0 (H) 11.4 -  15.2 seconds   INR 3.2 (H) 0.8 - 1.2    Comment: (NOTE) INR goal varies based on device and disease states. Performed at [facility], [address] [place], [place] [address]   Renal function panel     Status: Abnormal   Collection Time: 04/17/19  6:22 AM  Result Value Ref Range   Sodium 138 135 - 145 mmol/L   Potassium 4.6 3.5 - 5.1 mmol/L   Chloride 103 98 - 111 mmol/L   CO2 26 22 - 32 mmol/L   Glucose, Bld 223 (H) 70 - 99 mg/dL   BUN 67 (H) 8 - 23 mg/dL   Creatinine, Ser 8.25 (H) 0.61 - 1.24 mg/dL   Calcium 7.7 (L) 8.9 - 10.3 mg/dL   Phosphorus 4.4 2.5 -  4.6 mg/dL   Albumin 2.7 (L) 3.5 - 5.0 g/dL   GFR calc non Af Amer 22 (L) >60 mL/min   GFR calc Af Amer 26 (L) >60 mL/min   Anion gap 9 5 - 15    Comment: Performed at [facility], [address] [place], [place] [address]  Glucose, capillary     Status: Abnormal   Collection Time: 04/17/19  7:35 AM  Result Value Ref Range   Glucose-Capillary 195 (H) 70 - 99 mg/dL  Glucose, capillary     Status: Abnormal   Collection Time: 04/17/19 11:43 AM  Result Value Ref Range   Glucose-Capillary 181 (H) 70 - 99 mg/dL  Glucose, capillary     Status: Abnormal   Collection Time: 04/17/19  5:05 PM  Result Value Ref Range   Glucose-Capillary 190 (H) 70 - 99 mg/dL    Studies/Results:  HEAD CT FINDINGS: Brain: There is no mass, hemorrhage or extra-axial collection. The size and configuration of the ventricles and extra-axial CSF spaces are normal. There is hypoattenuation of the white matter, most commonly indicating chronic small vessel disease.  Vascular: Atherosclerotic calcification of the vertebral and internal carotid arteries at the skull base. No abnormal hyperdensity of the major intracranial arteries or dural venous sinuses.  Skull: The visualized skull base, calvarium and extracranial soft tissues are normal.  Sinuses/Orbits: No fluid levels or advanced mucosal thickening of the visualized paranasal sinuses. No  mastoid or middle ear effusion. The orbits are normal.  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   The head CT scan is reviewed in person. There is mild template moderate global atrophy. There is also mild periventricular white matter disease. No acute changes are noted.  EEG Description: During awake state, no clear posterior dominant rhythm was seen.  EEG showed continuous generalized 3 to 6 Hz theta-delta slowing. Hyperventilation and photic stimulation were not performed.  Abnormality -Continuous slow, generalized  IMPRESSION: This study is suggestive of mild to moderate diffuse encephalopathy, nonspecific to etiology.  No seizures or epileptiform discharges were seen throughout the recording.           Azavier Creson A. Noah Pilgrim, M.D.  Diplomate, Biomedical engineer of Psychiatry and Neurology ( Neurology). 04/17/2019, 7:35 PM

## 2019-04-17 NOTE — [facility] Notes (Signed)
  Speech Language Pathology Treatment: Dysphagia  Patient Details Name: Noah [name] MRN: [id number] DOB: 03/06/1941 Today's Date: 04/17/2019 Time: 1157-2620 SLP Time Calculation (min) (ACUTE ONLY): 18 min  Assessment / Plan / Recommendation Clinical Impression  Pt seen in room for ongoing skilled diagnostic dysphagia intervention and observed with regular textures and thin liquids via straw sips. Pt self fed and reminded to avoid talking during po intake. Pt without overt signs or symptoms of aspiration. Pt is at risk for aspiration due to difficulty positioning in bed due to increased body habitus and occasional impulsivity with intake (increased rate and talking while eating). Continue diet as ordered and SLP will follow.    HPI HPI: 78 year old male with a history of diabetes, hypertension, obstructive sleep apnea, chronic kidney disease stage III, seizure disorder, admitted to the hospital with 1 day of confusion.  Basic work-up including CT head, blood cultures were found to be unrevealing.  He remains confused. RN reported that Pt was clearing his throat, coughing, and gagging after breakfast meal this AM and requested BSE.       SLP Plan  Continue with current plan of care       Recommendations  Diet recommendations: Regular;Thin liquid Liquids provided via: Cup;Straw Medication Administration: Whole meds with liquid Supervision: Patient able to self feed;Intermittent supervision to cue for compensatory strategies Compensations: Slow rate Postural Changes and/or Swallow Maneuvers: Seated upright 90 degrees;Upright 30-60 min after meal                Oral Care Recommendations: Oral care BID Follow up Recommendations: None SLP Visit Diagnosis: Dysphagia, unspecified (R13.10) Plan: Continue with current plan of care       Thank you,  Genene Churn, [place]                 [place] 04/17/2019, 3:31 PM

## 2019-04-17 NOTE — Progress Notes (Signed)
Inpatient Diabetes Program Recommendations  AACE/ADA: New Consensus Statement on Inpatient Glycemic Control (2015)  Target Ranges:  Prepandial:   less than 140 mg/dL      Peak postprandial:   less than 180 mg/dL (1-2 hours)      Critically ill patients:  140 - 180 mg/dL   Results for MOATAZ, TAVIS (MRN [id number]) as of 04/17/2019 11:26  Ref. Range 04/16/2019 07:13 04/16/2019 11:04 04/16/2019 15:59 04/16/2019 20:58  Glucose-Capillary Latest Ref Range: 70 - 99 mg/dL 225 (H)  3 units NOVOLOG  266 (H)  5 units NOVOLOG  321 (H)  7 units NOVOLOG  225 (H)   Results for TYRAIL, GRANDFIELD (MRN [id number]) as of 04/17/2019 11:26  Ref. Range 04/17/2019 07:35  Glucose-Capillary Latest Ref Range: 70 - 99 mg/dL 195 (H)  2 units NOVOLOG      Home DM Meds: Soliqua (insulin glargine/lixisenatide) 30 units Daily                             Metformin 1500 mg Daily  Current Orders: Novolog Sensitive Correction Scale/ SSI (0-9 units) TID AC    MD- Note patient takes Soliqua 30 units Daily at home.  This medication and dose provides patient with 30 units insulin glargine per day.  Please consider starting Lantus 15 units Daily (50% total amount of insulin patient gets at home)    --Will follow patient during hospitalization--  Wyn Quaker RN, MSN, CDE Diabetes Coordinator Inpatient Glycemic Control Team Team Pager: [phone] (8a-5p)

## 2019-04-17 NOTE — TOC Initial Note (Signed)
Transition of Care [facility]) - Initial/Assessment Note    Patient Details  Name: Noah [name] MRN: [id number] Date of Birth: 1940/08/13  Transition of [facility]) CM/SW Contact:    Boneta Lucks, RN Phone Number: 04/17/2019, 10:38 AM  Clinical Narrative:        Patient admitted for acute encephalopathy. Patient is high risk for readmission. Patient is confused, spoke with his wife Noah [name].  PT is recommending SNF. Noah [name] agrees it would be better for him to get stronger before he comes home. Nephrology consulted.  FL2 completed and send out.  Wife first choice is [facility] [place], Noah [name] states they are interested. Patient is not medically ready. TOC to follow.            Expected Discharge Plan: Skilled Nursing Facility Barriers to Discharge: Continued Medical Work up   Patient Goals and CMS Choice Patient states their goals for this hospitalization and ongoing recovery are:: go to SNF then back home. CMS Medicare.gov Compare Post Acute Care list provided to:: Patient Represenative (must comment) Choice offered to / list presented to : Spouse  Expected Discharge Plan and Services Expected Discharge Plan: [place]       Prior Living Arrangements/Services              Need for Family Participation in Patient Care: Yes (Comment) Care giver support system in place?: Yes (comment)   Criminal Activity/Legal Involvement Pertinent to Current Situation/Hospitalization: No - Comment as needed  Activities of Daily Living Home Assistive Devices/Equipment: Cane (specify quad or straight) ADL Screening (condition at time of admission) Patient's cognitive ability adequate to safely complete daily activities?: Yes Is the patient deaf or have difficulty hearing?: No Does the patient have difficulty seeing, even when wearing glasses/contacts?: No Does the patient have difficulty concentrating, remembering, or making decisions?: Yes Patient able to express need for  assistance with ADLs?: Yes Does the patient have difficulty dressing or bathing?: Yes Independently performs ADLs?: No Communication: Independent Grooming: Needs assistance Is this a change from baseline?: Pre-admission baseline Feeding: Independent Bathing: Needs assistance Is this a change from baseline?: Pre-admission baseline Toileting: Needs assistance Is this a change from baseline?: Pre-admission baseline In/Out Bed: Needs assistance Is this a change from baseline?: Pre-admission baseline Walks in Home: Needs assistance Is this a change from baseline?: Pre-admission baseline Does the patient have difficulty walking or climbing stairs?: Yes Weakness of Legs: Both Weakness of Arms/Hands: None  Permission Sought/Granted Permission sought to share information with : Family Supports, Case Manager    Share Information with NAME: Noah [name]     Permission granted to share info w Relationship: Wife     Emotional Assessment       Orientation: : Oriented to Self Alcohol / Substance Use: Not Applicable Psych Involvement: No (comment)  Admission diagnosis:  Delirium [R41.0] Acute encephalopathy [G93.40] Altered behavior [R46.89] Patient Active Problem List   Diagnosis Date Noted  . Epilepsy ([place]) 04/11/2019  . Acute encephalopathy 04/10/2019  . Acute on chronic combined systolic and diastolic congestive heart failure ([place]) 03/29/2019  . Acute exacerbation of CHF (congestive heart failure) ([place]) 03/28/2019  . Fall at home, initial encounter 05/18/2018  . CKD (chronic kidney disease) stage 3, GFR 30-59 ml/min 05/18/2018  . OSA (obstructive sleep apnea) 05/18/2018  . Atrial fibrillation ([place]) 02/21/2018  . Encounter for therapeutic drug monitoring 02/21/2018  . Ischemic cardiomyopathy 01/21/2018  . 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 CAD S/P percutaneous coronary angioplasty 01/21/2018  . Chronic  systolic (congestive) heart failure (HCC) 01/20/2018   . 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 Dyslipidemia, goal LDL below 70 01/16/2009  . OVERWEIGHT/OBESITY 01/16/2009  . SOB (shortness of breath) 01/16/2009   PCP:  Estanislado Pandy, MD Pharmacy:   [facility] [phone] - [place], [place] - [address] [place] [address] Phone: [phone] Fax: [phone]     Social Determinants of Health (SDOH) Interventions    Readmission Risk Interventions Readmission Risk Prevention Plan 04/17/2019  Transportation Screening Complete  PCP or Specialist Appt within 3-5 Days Not Complete  HRI or Home Care Consult Complete  Social Work Consult for Recovery Care Planning/Counseling Complete  Palliative Care Screening Not Complete  Medication Review Oceanographer) Complete  Some recent data might be hidden

## 2019-04-17 NOTE — Progress Notes (Signed)
PROGRESS NOTE    Noah [name]  PYK:[id number] DOB: Jul 04, 1940 DOA: 04/10/2019 PCP: Estanislado Pandy, MD    Brief Narrative:  78 year old male with a history of diabetes, hypertension, obstructive sleep apnea, chronic kidney disease stage III, seizure disorder, admitted to the hospital with 1 day of confusion.  Basic work-up including CT head, blood cultures were found to be unrevealing.  He remains confused.  Further work-up in process.   Assessment & Plan:   Principal Problem:   Acute encephalopathy Active Problems:   DM (diabetes mellitus), type 2 with complications (HCC)   Chronic systolic (congestive) heart failure (HCC)   CAD S/P percutaneous coronary angioplasty   Atrial fibrillation (HCC)   CKD (chronic kidney disease) stage 3, GFR 30-59 ml/min   OSA (obstructive sleep apnea)   Epilepsy (HCC)   1)Acute encephalopathy-- He is noted to have mild dysarthria, but no acute focal neurologic deficits.  No significant hypercarbia on blood gas.  Urinalysis, TSH, ammonia, B12 and RPR unrevealing.  Cannot perform MRI due to presence of pacemaker.  EEG did not show any epileptiform discharges. Wife does not report any recent medication changes and it is unlikely that he took any extra medications.  Patient had hypotension.  Likely related to volume depletion in the setting of antihypertensive medications.   Metoprolol, Lasix and Entresto have been discontinued.  He received IV fluid boluses.    2)Coronary artery disease.  No complaints of chest pain.  Continue aspirin, statin and beta-blocker  3)Pneumonia--.  Patient noted to have small infiltrate at left base.  He does appear to be mildly short of breath and reports having a cough.--Cannot rule out aspiration component, okay to continue Augmentin.   4)Chronic systolic CHF.  EF 40-45%. Holding lasix and Entresto due to elevated creatinine.   5)Insulin dependent diabetes.  A1c was 8.7 earlier this month.  Continue on sliding scale for  now.  6)AKI on chronic kidney disease stage III.  Baseline creatinine of 1.6..   Hold Lasix, hold Entresto, hold Metformin, renal ultrasound requested  -Nephrology consult appreciated -Creatinine is down to 2.6 from 3.5   7)Paroxysmal atrial fibrillation.  Rate controlled with metoprolol.  Anticoagulated with Coumadin.  8)Epilepsy.  Continue on Keppra and Neurontin--dose adjusted for renal function  9)Obstructive sleep apnea.  Continue supplemental O2 at night.  Continue CPAP.  10)FEN--speech pathologist recommends regular solids and thin liquids  Disposition-awaiting further improvement in renal function and mental status and availability of SNF bed for placement   DVT prophylaxis: Warfarin Code Status: Full code Family Communication: Discussed with wife Disposition Plan: -SNF rehab  Consultants:     Procedures:     Antimicrobials:      Subjective: -More awake, more interactive, oral intake is still poor  Objective: Vitals:   04/17/19 0500 04/17/19 0556 04/17/19 0746 04/17/19 1800  BP:  129/76  (!) 145/69  Pulse:  81  87  Resp:  17  18  Temp:  (!) 97.5 F (36.4 C)  97.9 F (36.6 C)  TempSrc:  Oral  Oral  SpO2:  99% 98% 99%  Weight: 131.2 kg     Height:        Intake/Output Summary (Last 24 hours) at 04/17/2019 1858 Last data filed at 04/17/2019 1800 Gross per 24 hour  Intake 240 ml  Output 2550 ml  Net -2310 ml   Filed Weights   04/14/19 0500 04/16/19 0500 04/17/19 0500  Weight: 126.2 kg 126.2 kg 131.2 kg    Examination:  General exam: No distress, more awake, responsive Respiratory system: Clear to auscultation. Respiratory effort normal. Cardiovascular system: Irregular. No murmurs, rubs, gallops. Gastrointestinal system: Abdomen is nondistended, soft and nontender.  Normal bowel sounds heard. Central nervous system:  No focal neurological deficits. Extremities: 1+ edema bilaterally Skin: No rashes, lesions or ulcers Psychiatry: More  interactive,   Data Reviewed:   CBC: Recent Labs  Lab 04/12/19 0530 04/13/19 0556 04/14/19 0555 04/15/19 0603 04/16/19 0625  WBC 8.2 10.3 11.9* 9.6 9.3  HGB 10.4* 10.8* 10.2* 10.8* 9.6*  HCT 35.2* 36.5* 35.6* 38.1* 33.6*  MCV 95.1 94.6 96.2 98.2 98.2  PLT 187 186 165 150 108*   Basic Metabolic Panel: Recent Labs  Lab 04/13/19 0556 04/14/19 0555 04/15/19 0603 04/16/19 0625 04/17/19 0622  NA 146* 142 141 139 138  K 4.3 4.7 5.1 4.9 4.6  CL 104 101 105 100 103  CO2 28 31 23 25 26   GLUCOSE 118* 237* 176* 256* 223*  BUN 48* 59* 67* 72* 67*  CREATININE 2.12* 2.41* 3.29* 3.50* 2.65*  CALCIUM 9.1 8.6* 8.2* 8.1* 7.7*  MG  --   --  1.9  --   --   PHOS  --   --   --  6.4* 4.4   GFR: Estimated Creatinine Clearance: 30.4 mL/min (A) (by C-G formula based on SCr of 2.65 mg/dL (H)). Liver Function Tests: Recent Labs  Lab 04/10/19 1954 04/11/19 0512 04/16/19 0625 04/17/19 0622  AST 15 14*  --   --   ALT 15 13  --   --   ALKPHOS 68 61  --   --   BILITOT 0.6 0.8  --   --   PROT 6.9 6.2*  --   --   ALBUMIN 3.3* 2.8* 2.7* 2.7*   No results for input(s): LIPASE, AMYLASE in the last 168 hours. Recent Labs  Lab 04/10/19 1954  AMMONIA 34   Coagulation Profile: Recent Labs  Lab 04/13/19 0556 04/14/19 0555 04/15/19 0603 04/16/19 0625 04/17/19 0622  INR 2.6* 3.7* 4.1* 4.3* 3.2*   Cardiac Enzymes: Recent Labs  Lab 04/10/19 1954  CKTOTAL 74   BNP (last 3 results) No results for input(s): PROBNP in the last 8760 hours. HbA1C: No results for input(s): HGBA1C in the last 72 hours. CBG: Recent Labs  Lab 04/16/19 1559 04/16/19 2058 04/17/19 0735 04/17/19 1143 04/17/19 1705  GLUCAP 321* 225* 195* 181* 190*   Lipid Profile: No results for input(s): CHOL, HDL, LDLCALC, TRIG, CHOLHDL, LDLDIRECT in the last 72 hours. Thyroid Function Tests: No results for input(s): TSH, T4TOTAL, FREET4, T3FREE, THYROIDAB in the last 72 hours. Anemia Panel: No results for input(s):  VITAMINB12, FOLATE, FERRITIN, TIBC, IRON, RETICCTPCT in the last 72 hours. Sepsis Labs: Recent Labs  Lab 04/10/19 1954  LATICACIDVEN 0.8    Recent Results (from the past 240 hour(s))  SARS CORONAVIRUS 2 (TAT 6-24 HRS) Nasopharyngeal Nasopharyngeal Swab     Status: None   Collection Time: 04/10/19 10:20 PM   Specimen: Nasopharyngeal Swab  Result Value Ref Range Status   SARS Coronavirus 2 NEGATIVE NEGATIVE Final    Comment: (NOTE) SARS-CoV-2 target nucleic acids are NOT DETECTED. The SARS-CoV-2 RNA is generally detectable in upper and lower respiratory specimens during the acute phase of infection. Negative results do not preclude SARS-CoV-2 infection, do not rule out co-infections with other pathogens, and should not be used as the sole basis for treatment or other patient management decisions. Negative results must be combined with clinical  observations, patient history, and epidemiological information. The expected result is Negative. Fact Sheet for Patients: SugarRoll.be Fact Sheet for Healthcare Providers: https://www.woods-mathews.com/ This test is not yet approved or cleared by the [place] FDA and  has been authorized for detection and/or diagnosis of SARS-CoV-2 by FDA under an Emergency Use Authorization (EUA). This EUA will remain  in effect (meaning this test can be used) for the duration of the COVID-19 declaration under Section 56 4(b)(1) of the Act, 21 U.S.C. section 360bbb-3(b)(1), unless the authorization is terminated or revoked sooner. Performed at [facility], [place] [address], [place], [place] [address]   Urine culture     Status: Abnormal   Collection Time: 04/11/19 12:20 AM   Specimen: Urine, Random  Result Value Ref Range Status   Specimen Description   Final    URINE, RANDOM Performed at [facility], [address] [place], [place] [address]    Special Requests   Final    NONE Performed at [facility], [address], [place], [place] [address]    Culture (A)  Final    <10,000 COLONIES/mL INSIGNIFICANT GROWTH Performed at [facility], [place] [address] [place] [address] [place], [place] [address]    Report Status 04/12/2019 FINAL  Final    Radiology Studies: US RENAL  Result Date: 04/16/2019 CLINICAL DATA:  Acute kidney injury. EXAM: RENAL / URINARY TRACT ULTRASOUND COMPLETE COMPARISON:  None. FINDINGS: Right Kidney: Renal measurements: 11.0 x 8.4 x 7.9 cm = volume: 379 mL . Echogenicity within normal limits. No mass or hydronephrosis visualized. Left Kidney: Renal measurements: 12.2 x 7.9 x 8.6 cm = volume: 433 mL. Echogenicity within normal limits. No mass or hydronephrosis visualized. Bladder: Bilateral ureteral jets visible. Other: None. IMPRESSION: Bilateral renal enlargement by overall measurements. Large amount of fat in the renal hilar regions, probably artificially increasing the actual renal size. There is no hydronephrosis. There may be some renal enlargement component due to acute nephritis or diabetic glomerular nephropathy. Electronically Signed   By: Nelson Chimes M.D.   On: 04/16/2019 10:36   Scheduled Meds: . amoxicillin-clavulanate  1 tablet Oral BID  . aspirin EC  81 mg Oral Daily  . atorvastatin  40 mg Oral q1800  . Chlorhexidine Gluconate Cloth  6 each Topical Daily  . famotidine  20 mg Oral QHS  . finasteride  5 mg Oral QPM  . FLUoxetine  20 mg Oral QPM  . gabapentin  100 mg Oral Daily  . gabapentin  200 mg Oral QHS  . guaiFENesin  15 mL Oral TID  . insulin aspart  0-9 Units Subcutaneous TID WC  . levETIRAcetam  500 mg Oral BID  . nystatin   Topical BID  . pantoprazole  40 mg Oral Daily  . sodium chloride flush  3 mL Intravenous Q12H  . sodium chloride flush  3 mL Intravenous Q12H  . tamsulosin  0.8 mg Oral QPM  . Warfarin - Pharmacist Dosing Inpatient   Does not apply q1800   Continuous Infusions: . sodium chloride      LOS: 5 days   Roxan Hockey, MD [facility]  If 7PM-7AM, please contact night-coverage [email or web address]  04/17/2019, 6:58 PM

## 2019-04-17 NOTE — Progress Notes (Signed)
[facility] Progress Note  Name: Noah [name] MRN: [id number] DOB: [birth year]  Chief Complaint:  Altered mental status   Subjective:  Patient had 1.4 liters UOP over 12/28.  He had a foley catheter placed on 12/28 with 600 mL urine output per nursing report.  Renal ultrasound with no hydro.  He reports mild nosebleed overnight - hasn't ever happened before that he recalls.  Has had some shortness of breath - normally on lasix at home - former smoker but doesn't use inhalers regularly he states.  Review of systems:  States some shortness of breath  No chest pain  Nausea without vomiting  Taking some PO  ------------ Background on consult:  LADISLAO COHENOUR is a 78 y.o. male with a history of diabetes, hypertension, obstructive sleep apnea, seizure disorder and CKD stage III who presented to the hospital with a 1 day history of confusion.  He had a CT head which was negative for acute process.  He was found to have developed acute renal failure over course of hospitalization with creatinine trends as below.  Cr was at his approximate baseline of 1.60 on 12/22 and steadily increased to 3.5 on the date of consult, 12/28.  Note had straight cath for 400 mL on 12/27.  Home meds include entresto and lasix; lasix was stopped 12/25 and entresto stopped 12/26.  He has a history of HTN but has experienced hypotension here.  Intake UA 100 mg/dL protein and > 50 RBC.   Has been on both tamsulosin and finasteride; he states that he has had to have a Foley catheter before.  He is conversant and does have some delay in answering questions but answers.  He would want dialysis if indicated.  His wife is worried about the effect on his heart but would want dialysis if he needed it emergently.  He states that he takes acetaminophen at home for pain.  His wife makes his medical decisions when he is not able to.  His brother has kidney failure to some degree however is not on dialysis.  Didn't have strict  ins/outs but had 400 mL UOP and 3 uncharted voids.   Intake/Output Summary (Last 24 hours) at 04/17/2019 0828 Last data filed at 04/17/2019 0557 Gross per 24 hour  Intake 360 ml  Output 1350 ml  Net -990 ml    Vitals:  Vitals:   04/16/19 2312 04/17/19 0500 04/17/19 0556 04/17/19 0746  BP:   129/76   Pulse:   81   Resp:   17   Temp:   (!) 97.5 F (36.4 C)   TempSrc:   Oral   SpO2: 100%  99% 98%  Weight:  131.2 kg    Height:         Physical Exam:  General elderly male in bed in no acute distress HEENT normocephalic atraumatic extraocular movements intact sclera anicteric Neck supple trachea midline Lungs decreased breath sounds with occ wheezing; on 3 liters; normal work of breathing at rest  Heart S1S2 no rubs appreciated Abdomen soft nontender obese habitus  Extremities no lower extremity edema  Psych normal mood and affect   Medications reviewed   Labs:  BMP Latest Ref Rng & Units 04/17/2019 04/16/2019 04/15/2019  Glucose 70 - 99 mg/dL 223(H) 256(H) 176(H)  BUN 8 - 23 mg/dL 67(H) 72(H) 67(H)  Creatinine 0.61 - 1.24 mg/dL 2.65(H) 3.50(H) 3.29(H)  Sodium 135 - 145 mmol/L 138 139 141  Potassium 3.5 - 5.1 mmol/L 4.6 4.9 5.1  Chloride 98 - 111 mmol/L 103 100 105  CO2 22 - 32 mmol/L 26 25 23   Calcium 8.9 - 10.3 mg/dL 7.7(L) 8.1(L) 8.2(L)    Assessment/Plan:   # AKI  - Secondary in part to ATN from hypotension; also noted retention with 400 mL in/out cath on 12/27 and 600 mL returned with foley placement per nursing report.  Ruling out GN as over 50 RBC noted on UA and confirmed on repeat.  Up/cr ratio 210 mg/g. Renal ultrasound with no hydro  - Improving with supportive care  - Given retention and AKI continue foley catheter  - Hold entresto for now  - Lasix IV once now, 12/29  - Hold metformin  - With hematuria sent ANCA, ANA, complement and anti-GBM.    # Epistaxis  - changed to humidified oxygen  - GN work-up sent as above  - supportive care per primary  team   # CKD stage III  - baseline Cr near 1.6  # AMS - Improved  - work-up per primary team  - Given AKI would provide no more than 300 mg gabapentin daily however given confusion would discontinue gabapentin per primary team discretion   # Hypotension - hx HTN with hypotension this admission - hypotension improved   # Ischemic cardiomyopathy with chronic systolic CHF - 07/7652 TTE with EF 40-45%  - Hold entresto for now  - lasix IV once now, 12/29  # Chronic hypoxic resp failure  - states has 2 liter oxygen requirement at home  - optimize volume as above   # Normocytic anemia - no acute indication for PRBC's    Updated patient's wife on 12/29 - renal function slightly improved.   Claudia Desanctis, MD 04/17/2019 8:28 AM

## 2019-04-17 NOTE — Progress Notes (Signed)
Nursing home Choices:  [facility] [address] [place], [place] [address] [phone] Overall rating Much above average 2. 2.4 [facility] [address] [place], [place] [address] [phone] Overall rating Below average 3. 8.3 [facility] [address] [place], [place] [address] [phone]

## 2019-04-17 NOTE — [place] FL2 (Signed)
[place] MEDICAID FL2 LEVEL OF CARE SCREENING TOOL     IDENTIFICATION  Patient Name: Noah [name] Birthdate: 10-22-1940 Sex: male Admission Date (Current Location): 04/10/2019  [facility] and [place] Number:  [employer] and Address:         Provider Number: [id number]  Attending Physician Name and Address:  Shon Hale, MD  Relative Name and Phone Number:  Aswad Wandrey - wife [phone]    Current Level of Care: Hospital Recommended Level of Care: Skilled Nursing Facility Prior Approval Number:    Date Approved/Denied:   PASRR Number: [id number]  Discharge Plan: SNF    Current Diagnoses: Patient Active Problem List   Diagnosis Date Noted  . Epilepsy (HCC) 04/11/2019  . Acute encephalopathy 04/10/2019  . Acute on chronic combined systolic and diastolic congestive heart failure (HCC) 03/29/2019  . Acute exacerbation of CHF (congestive heart failure) (HCC) 03/28/2019  . Fall at home, initial encounter 05/18/2018  . CKD (chronic kidney disease) stage 3, GFR 30-59 ml/min 05/18/2018  . OSA (obstructive sleep apnea) 05/18/2018  . Atrial fibrillation (HCC) 02/21/2018  . Encounter for therapeutic drug monitoring 02/21/2018  . Ischemic cardiomyopathy 01/21/2018  . 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 CAD S/P percutaneous coronary angioplasty 01/21/2018  . Chronic systolic (congestive) heart failure (HCC) 01/20/2018  . 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 Dyslipidemia, goal LDL below 70 01/16/2009  . OVERWEIGHT/OBESITY 01/16/2009  . SOB (shortness of breath) 01/16/2009    Orientation RESPIRATION BLADDER Height & Weight     Self, Time, Situation, Place  Normal Indwelling catheter Weight: 131.2 kg Height:  5\' 8"  (172.7 cm)  BEHAVIORAL SYMPTOMS/MOOD NEUROLOGICAL BOWEL NUTRITION STATUS      Continent Diet(see discharge summary)  AMBULATORY STATUS COMMUNICATION OF NEEDS Skin   Extensive  Assist Verbally Bruising(arms and legs)                       Personal Care Assistance Level of Assistance  Bathing, Feeding, Dressing Bathing Assistance: Limited assistance Feeding assistance: Limited assistance Dressing Assistance: Limited assistance     Functional Limitations Info  Sight, Hearing, Speech Sight Info: Impaired Hearing Info: Impaired Speech Info: Adequate    SPECIAL CARE FACTORS FREQUENCY  PT (By licensed PT)     PT Frequency: 5 times a week              Contractures Contractures Info: Not present    Additional Factors Info  Code Status, Allergies Code Status Info: FULL Allergies Info: codeine erythromycin           Current Medications (04/17/2019):  This is the current hospital active medication list Current Facility-Administered Medications  Medication Dose Route Frequency Provider Last Rate Last Admin  . 0.9 %  sodium chloride infusion  250 mL Intravenous PRN Opyd, 04/19/2019, MD      . acetaminophen (TYLENOL) tablet 650 mg  650 mg Oral Q6H PRN Opyd, Lavone Neri, MD   650 mg at 04/16/19 1328   Or  . acetaminophen (TYLENOL) suppository 650 mg  650 mg Rectal Q6H PRN Opyd, 04/18/19, MD      . amoxicillin-clavulanate (AUGMENTIN) 500-125 MG per tablet 500 mg  1 tablet Oral BID Lavone Neri, MD   500 mg at 04/16/19 2155  . aspirin EC tablet 81 mg  81 mg Oral Daily Opyd, 2156, MD   81 mg at 04/16/19 0846  . atorvastatin (LIPITOR) tablet 40 mg  40 mg Oral q1800 Vianne Bulls, MD   40 mg at 04/16/19 1813  . Chlorhexidine Gluconate Cloth 2 % PADS 6 each  6 each Topical Daily Kathie Dike, MD   6 each at 04/16/19 1400  . famotidine (PEPCID) tablet 20 mg  20 mg Oral QHS Opyd, Ilene Qua, MD   20 mg at 04/16/19 2155  . finasteride (PROSCAR) tablet 5 mg  5 mg Oral QPM Opyd, Ilene Qua, MD   5 mg at 04/16/19 1813  . FLUoxetine (PROZAC) capsule 20 mg  20 mg Oral QPM Opyd, Ilene Qua, MD   20 mg at 04/16/19 1813  . furosemide (LASIX) injection 60  mg  60 mg Intravenous Once Harrie Jeans C, MD      . gabapentin (NEURONTIN) capsule 100 mg  100 mg Oral Daily Opyd, Ilene Qua, MD   100 mg at 04/16/19 0846  . gabapentin (NEURONTIN) capsule 200 mg  200 mg Oral QHS Opyd, Ilene Qua, MD   200 mg at 04/16/19 2155  . guaiFENesin (ROBITUSSIN) 100 MG/5ML solution 300 mg  15 mL Oral TID Roxan Hockey, MD   300 mg at 04/16/19 2155  . insulin aspart (novoLOG) injection 0-9 Units  0-9 Units Subcutaneous TID WC Opyd, Ilene Qua, MD   7 Units at 04/16/19 1813  . levETIRAcetam (KEPPRA) tablet 500 mg  500 mg Oral BID Roxan Hockey, MD   500 mg at 04/16/19 2155  . nystatin (MYCOSTATIN/NYSTOP) topical powder   Topical BID Kathie Dike, MD   Given at 04/16/19 2154  . ondansetron (ZOFRAN) tablet 4 mg  4 mg Oral Q6H PRN Opyd, Ilene Qua, MD       Or  . ondansetron (ZOFRAN) injection 4 mg  4 mg Intravenous Q6H PRN Opyd, Ilene Qua, MD      . pantoprazole (PROTONIX) EC tablet 40 mg  40 mg Oral Daily Opyd, Ilene Qua, MD   40 mg at 04/16/19 0846  . senna-docusate (Senokot-S) tablet 1 tablet  1 tablet Oral QHS PRN Opyd, Ilene Qua, MD      . sodium chloride flush (NS) 0.9 % injection 3 mL  3 mL Intravenous Q12H Opyd, Ilene Qua, MD   3 mL at 04/16/19 2154  . sodium chloride flush (NS) 0.9 % injection 3 mL  3 mL Intravenous Q12H Opyd, Ilene Qua, MD   3 mL at 04/16/19 2259  . sodium chloride flush (NS) 0.9 % injection 3 mL  3 mL Intravenous PRN Opyd, Ilene Qua, MD      . tamsulosin (FLOMAX) capsule 0.8 mg  0.8 mg Oral QPM Opyd, Ilene Qua, MD   0.8 mg at 04/16/19 1813  . warfarin (COUMADIN) tablet 2 mg  2 mg Oral ONCE-1800 Roxan Hockey, MD      . Warfarin - Pharmacist Dosing Inpatient   Does not apply q1800 Franky Macho [facility]   Given at 04/16/19 1814     Discharge Medications: Please see discharge summary for a list of discharge medications.  Relevant Imaging Results:  Relevant Lab Results:   Additional Information SS# [id number]  Boneta Lucks,  RN

## 2019-04-18 LAB — CBC
HCT: 33.4 % — ABNORMAL LOW (ref 39.0–52.0)
Hemoglobin: 9.8 g/dL — ABNORMAL LOW (ref 13.0–17.0)
MCH: 27.7 pg (ref 26.0–34.0)
MCHC: 29.3 g/dL — ABNORMAL LOW (ref 30.0–36.0)
MCV: 94.4 fL (ref 80.0–100.0)
Platelets: 144 10*3/uL — ABNORMAL LOW (ref 150–400)
RBC: 3.54 MIL/uL — ABNORMAL LOW (ref 4.22–5.81)
RDW: 12.7 % (ref 11.5–15.5)
WBC: 9.5 10*3/uL (ref 4.0–10.5)
nRBC: 0 % (ref 0.0–0.2)

## 2019-04-18 LAB — RENAL FUNCTION PANEL
Albumin: 2.7 g/dL — ABNORMAL LOW (ref 3.5–5.0)
Anion gap: 11 (ref 5–15)
BUN: 52 mg/dL — ABNORMAL HIGH (ref 8–23)
CO2: 28 mmol/L (ref 22–32)
Calcium: 8.5 mg/dL — ABNORMAL LOW (ref 8.9–10.3)
Chloride: 106 mmol/L (ref 98–111)
Creatinine, Ser: 1.98 mg/dL — ABNORMAL HIGH (ref 0.61–1.24)
GFR calc Af Amer: 36 mL/min — ABNORMAL LOW (ref >60)
GFR calc non Af Amer: 31 mL/min — ABNORMAL LOW (ref >60)
Glucose, Bld: 179 mg/dL — ABNORMAL HIGH (ref 70–99)
Phosphorus: 2.2 mg/dL — ABNORMAL LOW (ref 2.5–4.6)
Potassium: 5.1 mmol/L (ref 3.5–5.1)
Sodium: 145 mmol/L (ref 135–145)

## 2019-04-18 LAB — GLUCOSE, CAPILLARY
Glucose-Capillary: 148 mg/dL — ABNORMAL HIGH (ref 70–99)
Glucose-Capillary: 188 mg/dL — ABNORMAL HIGH (ref 70–99)
Glucose-Capillary: 152 mg/dL — ABNORMAL HIGH (ref 70–99)
Glucose-Capillary: 145 mg/dL — ABNORMAL HIGH (ref 70–99)

## 2019-04-18 LAB — C4 COMPLEMENT: Complement C4, Body Fluid: 37 mg/dL (ref 12–38)

## 2019-04-18 LAB — ANA: Anti Nuclear Antibody (ANA): NEGATIVE

## 2019-04-18 LAB — MPO/PR-3 (ANCA) ANTIBODIES
ANCA Proteinase 3: <3.5 U/mL (ref 0.0–3.5)
Myeloperoxidase Abs: <9 U/mL (ref 0.0–9.0)

## 2019-04-18 LAB — PROTIME-INR
INR: 2.4 — ABNORMAL HIGH (ref 0.8–1.2)
Prothrombin Time: 26.2 seconds — ABNORMAL HIGH (ref 11.4–15.2)

## 2019-04-18 LAB — GLOMERULAR BASEMENT MEMBRANE ANTIBODIES: GBM Ab: 2 units (ref 0–20)

## 2019-04-18 LAB — C3 COMPLEMENT: C3 Complement: 146 mg/dL (ref 82–167)

## 2019-04-18 MED ORDER — AMOXICILLIN-POT CLAVULANATE 875-125 MG PO TABS
1.0000 | ORAL_TABLET | Freq: Two times a day (BID) | ORAL | Status: DC
  Administered 2019-04-18 – 2019-04-22 (×9): 1 via ORAL
  Filled 2019-04-18 (×9): qty 1

## 2019-04-18 MED ORDER — WARFARIN SODIUM 2.5 MG PO TABS
2.5000 mg | ORAL_TABLET | Freq: Once | ORAL | Status: AC
  Administered 2019-04-18: 2.5 mg via ORAL
  Filled 2019-04-18: qty 1

## 2019-04-18 MED ORDER — LEVETIRACETAM 500 MG PO TABS
750.0000 mg | ORAL_TABLET | Freq: Two times a day (BID) | ORAL | Status: DC
  Administered 2019-04-18 – 2019-04-25 (×15): 750 mg via ORAL
  Filled 2019-04-18 (×15): qty 1

## 2019-04-18 NOTE — Progress Notes (Signed)
CSW received call back from patients daughter [name] [facility]: [phone]) and patients wife concerning bed offers. CSW offered family [facility] and family was receptive and asked for overnight to decide. TOC team will continue to follow patient for discharge related needs.  [place] [facility]  Clinical Social Worker  Ph: [phone]

## 2019-04-18 NOTE — Progress Notes (Signed)
PROGRESS NOTE    Noah [name]  HER:[id number] DOB: 1940-04-21 DOA: 04/10/2019 PCP: Estanislado Pandy, MD    Brief Narrative:  78 year old male with a history of diabetes, hypertension, obstructive sleep apnea, chronic kidney disease stage III, seizure disorder, admitted to the hospital with 1 day of confusion.  Basic work-up including CT head, blood cultures were found to be unrevealing.  He remains confused.  Further work-up in process.   Assessment & Plan:   Principal Problem:   Acute encephalopathy Active Problems:   DM (diabetes mellitus), type 2 with complications (HCC)   Chronic systolic (congestive) heart failure (HCC)   CAD S/P percutaneous coronary angioplasty   Atrial fibrillation (HCC)   CKD (chronic kidney disease) stage 3, GFR 30-59 ml/min   OSA (obstructive sleep apnea)   Epilepsy (HCC)   1)Acute encephalopathy-- He is noted to have mild dysarthria, but no acute focal neurologic deficits.  No significant hypercarbia on blood gas.  Urinalysis, TSH, ammonia, B12 and RPR unrevealing.  Cannot perform MRI due to presence of pacemaker.  EEG did not show any epileptiform discharges. Wife does not report any recent medication changes and it is unlikely that he took any extra medications.  Patient had hypotension.  Likely related to volume depletion in the setting of antihypertensive medications.   Metoprolol, Lasix and Entresto have been discontinued.  He received IV fluid boluses.   -Cognitively improving, official neurology consult appreciated, may have had a seizure per neurologist, continue current antiseizure regimen adjusted for renal function  2)Coronary artery disease.  No complaints of chest pain.  Continue aspirin, statin and beta-blocker  3)Pneumonia--.  Patient noted to have small infiltrate at left base.  He does appear to be mildly short of breath and reports having a cough.--Cannot rule out aspiration component, okay to complete Augmentin.   4)Chronic systolic  CHF.  EF 40-45%. Holding lasix and Entresto due to elevated creatinine.   5)Insulin dependent diabetes.  A1c was 8.7 earlier this month.  Continue on sliding scale for now.  6)AKI on chronic kidney disease stage III.  Baseline creatinine of 1.6..   Hold Lasix, hold Entresto, hold Metformin, renal ultrasound requested  -Nephrology consult appreciated -Creatinine is down to 1.98 from 3.5   7)Paroxysmal atrial fibrillation.  Rate controlled with metoprolol.  Anticoagulated with Coumadin.--- INR is 2.4  8)Epilepsy.  Continue on Keppra and Neurontin--dose adjusted for renal function  9)Obstructive sleep apnea.  Continue supplemental O2 at night.  Continue CPAP.  10)FEN--speech pathologist recommends regular solids and thin liquids  Disposition-awaiting further improvement in renal function and mental status and availability of SNF bed for placement -Also awaiting family decision regarding SNF bed   DVT prophylaxis: Warfarin Code Status: Full code Family Communication: Discussed with wife Disposition Plan: -SNF rehab  Consultants:     Procedures:     Antimicrobials:      Subjective: -More coherent, more interactive, oral intake improving  Objective: Vitals:   04/17/19 2051 04/17/19 2054 04/18/19 0500 04/18/19 0522  BP: (!) 150/71   (!) 139/58  Pulse: 86   82  Resp: 18   20  Temp: 97.9 F (36.6 C)   98.2 F (36.8 C)  TempSrc: Oral   Oral  SpO2: 94% 94%  93%  Weight:   129.5 kg   Height:        Intake/Output Summary (Last 24 hours) at 04/18/2019 1931 Last data filed at 04/18/2019 1244 Gross per 24 hour  Intake 240 ml  Output 1500 ml  Net -1260 ml   Filed Weights   04/16/19 0500 04/17/19 0500 04/18/19 0500  Weight: 126.2 kg 131.2 kg 129.5 kg    Examination:  General exam: No distress, more awake, responsive Respiratory system: Clear to auscultation. Respiratory effort normal. Cardiovascular system: Irregular. No murmurs, rubs, gallops. Gastrointestinal  system: Abdomen is nondistended, soft and nontender.  Normal bowel sounds heard. Central nervous system: Generalized weakness, no focal neurological deficits. Extremities: 1+ edema bilaterally Skin: No rashes, lesions or ulcers Psychiatry: More interactive, oriented x3  Data Reviewed:   CBC: Recent Labs  Lab 04/13/19 0556 04/14/19 0555 04/15/19 0603 04/16/19 0625 04/18/19 0857  WBC 10.3 11.9* 9.6 9.3 9.5  HGB 10.8* 10.2* 10.8* 9.6* 9.8*  HCT 36.5* 35.6* 38.1* 33.6* 33.4*  MCV 94.6 96.2 98.2 98.2 94.4  PLT 186 165 150 108* 144*   Basic Metabolic Panel: Recent Labs  Lab 04/14/19 0555 04/15/19 0603 04/16/19 0625 04/17/19 0622 04/18/19 0629  NA 142 141 139 138 145  K 4.7 5.1 4.9 4.6 5.1  CL 101 105 100 103 106  CO2 31 23 25 26 28   GLUCOSE 237* 176* 256* 223* 179*  BUN 59* 67* 72* 67* 52*  CREATININE 2.41* 3.29* 3.50* 2.65* 1.98*  CALCIUM 8.6* 8.2* 8.1* 7.7* 8.5*  MG  --  1.9  --   --   --   PHOS  --   --  6.4* 4.4 2.2*   GFR: Estimated Creatinine Clearance: 40.4 mL/min (A) (by C-G formula based on SCr of 1.98 mg/dL (H)). Liver Function Tests: Recent Labs  Lab 04/16/19 0625 04/17/19 0622 04/18/19 0629  ALBUMIN 2.7* 2.7* 2.7*   No results for input(s): LIPASE, AMYLASE in the last 168 hours. No results for input(s): AMMONIA in the last 168 hours. Coagulation Profile: Recent Labs  Lab 04/14/19 0555 04/15/19 0603 04/16/19 0625 04/17/19 0622 04/18/19 0629  INR 3.7* 4.1* 4.3* 3.2* 2.4*   Cardiac Enzymes: No results for input(s): CKTOTAL, CKMB, CKMBINDEX, TROPONINI in the last 168 hours. BNP (last 3 results) No results for input(s): PROBNP in the last 8760 hours. HbA1C: No results for input(s): HGBA1C in the last 72 hours. CBG: Recent Labs  Lab 04/17/19 1705 04/17/19 2053 04/18/19 0743 04/18/19 1209 04/18/19 1627  GLUCAP 190* 219* 148* 188* 152*   Lipid Profile: No results for input(s): CHOL, HDL, LDLCALC, TRIG, CHOLHDL, LDLDIRECT in the last 72  hours. Thyroid Function Tests: No results for input(s): TSH, T4TOTAL, FREET4, T3FREE, THYROIDAB in the last 72 hours. Anemia Panel: No results for input(s): VITAMINB12, FOLATE, FERRITIN, TIBC, IRON, RETICCTPCT in the last 72 hours. Sepsis Labs: No results for input(s): PROCALCITON, LATICACIDVEN in the last 168 hours.  Recent Results (from the past 240 hour(s))  SARS CORONAVIRUS 2 (TAT 6-24 HRS) Nasopharyngeal Nasopharyngeal Swab     Status: None   Collection Time: 04/10/19 10:20 PM   Specimen: Nasopharyngeal Swab  Result Value Ref Range Status   SARS Coronavirus 2 NEGATIVE NEGATIVE Final    Comment: (NOTE) SARS-CoV-2 target nucleic acids are NOT DETECTED. The SARS-CoV-2 RNA is generally detectable in upper and lower respiratory specimens during the acute phase of infection. Negative results do not preclude SARS-CoV-2 infection, do not rule out co-infections with other pathogens, and should not be used as the sole basis for treatment or other patient management decisions. Negative results must be combined with clinical observations, patient history, and epidemiological information. The expected result is Negative. Fact Sheet for Patients: 04/12/19 Fact Sheet for Healthcare Providers: HairSlick.no This test  is not yet approved or cleared by the [place] and  has been authorized for detection and/or diagnosis of SARS-CoV-2 by FDA under an Emergency Use Authorization (EUA). This EUA will remain  in effect (meaning this test can be used) for the duration of the COVID-19 declaration under Section 56 4(b)(1) of the Act, 21 U.S.C. section 360bbb-3(b)(1), unless the authorization is terminated or revoked sooner. Performed at [facility], [place] [address] [place], [place] [address]   Urine culture     Status: Abnormal   Collection Time: 04/11/19 12:20 AM   Specimen: Urine, Random  Result Value Ref Range Status    Specimen Description   Final    URINE, RANDOM Performed at [facility], [address] [place], [place] [address]    Special Requests   Final    NONE Performed at [facility], [address] [place], [place] [address]    Culture (A)  Final    <10,000 COLONIES/mL INSIGNIFICANT GROWTH Performed at [facility], [place] [address] [place],  [address]    Report Status 04/12/2019 FINAL  Final    Radiology Studies: No results found. Scheduled Meds: . amoxicillin-clavulanate  1 tablet Oral Q12H  . aspirin EC  81 mg Oral Daily  . atorvastatin  40 mg Oral q1800  . Chlorhexidine Gluconate Cloth  6 each Topical Daily  . famotidine  20 mg Oral QHS  . finasteride  5 mg Oral QPM  . FLUoxetine  20 mg Oral QPM  . gabapentin  100 mg Oral Daily  . gabapentin  200 mg Oral QHS  . guaiFENesin  15 mL Oral TID  . insulin aspart  0-9 Units Subcutaneous TID WC  . levETIRAcetam  750 mg Oral BID  . nystatin   Topical BID  . pantoprazole  40 mg Oral Daily  . sodium chloride flush  3 mL Intravenous Q12H  . sodium chloride flush  3 mL Intravenous Q12H  . tamsulosin  0.8 mg Oral QPM  . Warfarin - Pharmacist Dosing Inpatient   Does not apply q1800   Continuous Infusions: . sodium chloride      LOS: 6 days   Roxan Hockey, MD [facility]  If 7PM-7AM, please contact night-coverage [email or web address]  04/18/2019, 7:31 PM

## 2019-04-18 NOTE — Progress Notes (Signed)
CSW in attempted to contact patients spouse to offer placement options but was unsuccessful. CSW left HIPAA compliant VM requesting call back.   CSW also attempted to contact patients daughter [name] [phone] without success.  TOC team will continue to follow patient for discharge needs. Pt is noted to be accepted into [facility]. [place] [facility]  Clinical Social Worker  Ph: [phone]

## 2019-04-18 NOTE — Progress Notes (Signed)
Physical Therapy Treatment Patient Details Name: Noah [name] MRN: [id number] DOB: [birth year] Today's Date: 04/18/2019    History of Present Illness Noah [name] is a 78 y.o. male with medical history significant for CAD, ischemic cardiomyopathy, insulin-dependent diabetes mellitus, OSA, chronic 3 L/min supplemental oxygen requirement, chronic kidney disease stage III, and seizures, presenting to the emergency department with 1 day of confusion.  Patient was reportedly in his usual state yesterday which is alert and fully oriented, but has been confused today per report of his family.  The patient also acknowledges feeling confused, but denies any headache, change in vision or hearing, or new focal numbness or weakness.  There has not been any recent fall or trauma reported and the patient is not known to use alcohol or illicit substances.    PT Comments    Patient demonstrates slight improvement for sitting up at bedside with head of bed raised and use of bed rail, requires frequent verbal/tactile cueing for proper hand placement with fair carryover, limited to take steps at bedside mostly due to c/o fatigue and tolerated sitting up in chair after therapy - RN aware.  Patient will benefit from continued physical therapy in hospital and recommended venue below to increase strength, balance, endurance for safe ADLs and gait.    Follow Up Recommendations  SNF;Supervision for mobility/OOB;Supervision - Intermittent     Equipment Recommendations  None recommended by PT    Recommendations for Other Services       Precautions / Restrictions Precautions Precautions: Fall Restrictions Weight Bearing Restrictions: No    Mobility  Bed Mobility Overal bed mobility: Needs Assistance Bed Mobility: Supine to Sit     Supine to sit: Mod assist     General bed mobility comments: requires use of bed rail and frequent verbal/tactile cueing for proper hand placement with fair  carryover  Transfers Overall transfer level: Needs assistance Equipment used: Rolling walker (2 wheeled) Transfers: Sit to/from [facility] Sit to Stand: Min assist;Mod assist Stand pivot transfers: Min assist;Mod assist       General transfer comment: slow labored movement  Ambulation/Gait Ambulation/Gait assistance: Min assist;Mod assist Gait Distance (Feet): 6 Feet Assistive device: Rolling walker (2 wheeled) Gait Pattern/deviations: Decreased step length - right;Decreased step length - left;Decreased stride length Gait velocity: slow   General Gait Details: limited to 6-7 slow labored side steps due to BLE weakness and c/o fatigue   Stairs             Wheelchair Mobility    Modified Rankin (Stroke Patients Only)       Balance Overall balance assessment: Needs assistance Sitting-balance support: Feet supported;No upper extremity supported Sitting balance-Leahy Scale: Fair Sitting balance - Comments: fair/good seated at bedside   Standing balance support: During functional activity;Bilateral upper extremity supported Standing balance-Leahy Scale: Fair Standing balance comment: using RW                            Cognition Arousal/Alertness: Awake/alert Behavior During Therapy: WFL for tasks assessed/performed;Impulsive Overall Cognitive Status: Within Functional Limits for tasks assessed                                        Exercises General Exercises - Lower Extremity Long Arc Quad: Seated;AROM;Strengthening;Both;5 reps Toe Raises: Seated;AROM;Strengthening;Both;5 reps Heel Raises: Seated;AROM;Strengthening;Both;5 reps    General Comments  Pertinent Vitals/Pain Pain Assessment: Faces Faces Pain Scale: Hurts little more Pain Location: at site of foley catheter insertion Pain Descriptors / Indicators: Sore    Home Living                      Prior Function            PT Goals  (current goals can now be found in the care plan section) Acute Rehab PT Goals Patient Stated Goal: return home with family to assist PT Goal Formulation: With patient Time For Goal Achievement: 04/30/19 Potential to Achieve Goals: Good Progress towards PT goals: Progressing toward goals    Frequency    Min 3X/week      PT Plan Current plan remains appropriate    Co-evaluation              AM-PAC PT "6 Clicks" Mobility   Outcome Measure  Help needed turning from your back to your side while in a flat bed without using bedrails?: A Lot Help needed moving from lying on your back to sitting on the side of a flat bed without using bedrails?: A Lot Help needed moving to and from a bed to a chair (including a wheelchair)?: A Lot Help needed standing up from a chair using your arms (e.g., wheelchair or bedside chair)?: A Lot Help needed to walk in hospital room?: A Little Help needed climbing 3-5 steps with a railing? : A Lot 6 Click Score: 13    End of Session Equipment Utilized During Treatment: Oxygen Activity Tolerance: Patient tolerated treatment well;Patient limited by fatigue Patient left: in chair;with call bell/phone within reach;with chair alarm set Nurse Communication: Mobility status PT Visit Diagnosis: Unsteadiness on feet (R26.81);Other abnormalities of gait and mobility (R26.89);Muscle weakness (generalized) (M62.81)     Time: 7106-2694 PT Time Calculation (min) (ACUTE ONLY): 24 min  Charges:  $Therapeutic Activity: 23-37 mins                     12:26 PM, 04/18/19 Ocie Bob, MPT Physical Therapist with [facility] 336 [phone] office [phone] mobile phone

## 2019-04-18 NOTE — Progress Notes (Signed)
ANTICOAGULATION CONSULT NOTE - Pharmacy Consult for Coumadin Indication: atrial fibrillation  Allergies  Allergen Reactions  . Codeine Other (See Comments)    constipation  . Erythromycin-Sulfisoxazole Other (See Comments)    Causes infection in throat and eyes    Patient Measurements: Height: 5\' 8"  (172.7 cm) Weight: 285 lb 7.9 oz (129.5 kg) IBW/kg (Calculated) : 68.4  Vital Signs: Temp: 98.2 F (36.8 C) (12/30 0522) Temp Source: Oral (12/30 0522) BP: 139/58 (12/30 0522) Pulse Rate: 82 (12/30 0522)  Labs: Recent Labs    04/16/19 0625 04/17/19 0622 04/18/19 0629  HGB 9.6*  --   --   HCT 33.6*  --   --   PLT 108*  --   --   LABPROT 40.9* 33.0* 26.2*  INR 4.3* 3.2* 2.4*  CREATININE 3.50* 2.65*  --     Estimated Creatinine Clearance: 30.2 mL/min (A) (by C-G formula based on SCr of 2.65 mg/dL (H)).   Medical History: Past Medical History:  Diagnosis Date  . CAD S/P percutaneous coronary angioplasty    remote PCIs in 1990's- last CFX PCI 2008  . Chronic kidney disease, stage III (moderate)   . Diabetes mellitus with complication (HCC)    with hyperglycemia and diabetic autonomic poly neuropathy  . Gastro-esophageal reflux disease with esophagitis   . Hyperlipidemia   . Hypertension   . Ischemic cardiomyopathy 01/20/2018   St Jude ICD   . Morbid obesity ([place])   . Obstructive sleep apnea    on CPAP  . Polyneuropathy   . Primary insomnia     Medications:  See electronic med rec  Assessment: 78 y.o. M presents with confusion. Pt on coumadin PTA for afib. Admission INR 2.3 (therapeutic). CBC ok on admission Home dose: 2.5mg  daily except for 5mg  on Tues/Thur/Sat  INR 2.1>> 2.6 >3.7>4.1>4.3>3.2>2.4   Goal of Therapy:  INR 2-3 Monitor platelets by anticoagulation protocol: Yes   Plan:  Warfarin 2.5 mg x 1. . Daily INR Monitor for S/S of bleeding  Margot Ables, PharmD Clinical Pharmacist 04/18/2019 7:45 AM

## 2019-04-19 DIAGNOSIS — I259 Chronic ischemic heart disease, unspecified: Secondary | ICD-10-CM | POA: Diagnosis not present

## 2019-04-19 DIAGNOSIS — I482 Chronic atrial fibrillation, unspecified: Secondary | ICD-10-CM | POA: Diagnosis not present

## 2019-04-19 LAB — RENAL FUNCTION PANEL
Albumin: 2.7 g/dL — ABNORMAL LOW (ref 3.5–5.0)
Anion gap: 10 (ref 5–15)
BUN: 46 mg/dL — ABNORMAL HIGH (ref 8–23)
CO2: 28 mmol/L (ref 22–32)
Calcium: 8.6 mg/dL — ABNORMAL LOW (ref 8.9–10.3)
Chloride: 107 mmol/L (ref 98–111)
Creatinine, Ser: 1.74 mg/dL — ABNORMAL HIGH (ref 0.61–1.24)
GFR calc Af Amer: 43 mL/min — ABNORMAL LOW (ref >60)
GFR calc non Af Amer: 37 mL/min — ABNORMAL LOW (ref >60)
Glucose, Bld: 142 mg/dL — ABNORMAL HIGH (ref 70–99)
Phosphorus: 3 mg/dL (ref 2.5–4.6)
Potassium: 4.5 mmol/L (ref 3.5–5.1)
Sodium: 145 mmol/L (ref 135–145)

## 2019-04-19 LAB — GLUCOSE, CAPILLARY
Glucose-Capillary: 121 mg/dL — ABNORMAL HIGH (ref 70–99)
Glucose-Capillary: 144 mg/dL — ABNORMAL HIGH (ref 70–99)
Glucose-Capillary: 120 mg/dL — ABNORMAL HIGH (ref 70–99)
Glucose-Capillary: 147 mg/dL — ABNORMAL HIGH (ref 70–99)

## 2019-04-19 LAB — PROTIME-INR
INR: 2.2 — ABNORMAL HIGH (ref 0.8–1.2)
Prothrombin Time: 24.5 seconds — ABNORMAL HIGH (ref 11.4–15.2)

## 2019-04-19 MED ORDER — WARFARIN SODIUM 5 MG PO TABS
5.0000 mg | ORAL_TABLET | Freq: Once | ORAL | Status: AC
  Administered 2019-04-19: 5 mg via ORAL
  Filled 2019-04-19: qty 1

## 2019-04-19 MED ORDER — RAMELTEON 8 MG PO TABS
8.0000 mg | ORAL_TABLET | Freq: Once | ORAL | Status: AC
  Administered 2019-04-19: 8 mg via ORAL
  Filled 2019-04-19: qty 1

## 2019-04-19 NOTE — Progress Notes (Signed)
Patient is refusing the use of CPAP at this time RN aware 

## 2019-04-19 NOTE — Care Management Important Message (Signed)
Important Message  Patient Details  Name: Noah [name] MRN: [id number] Date of Birth: 1940-05-26   Medicare Important Message Given:  Yes(RN will deliver to patient)     Tommy Medal 04/19/2019, 12:17 PM

## 2019-04-19 NOTE — Progress Notes (Signed)
ANTICOAGULATION CONSULT NOTE - Pharmacy Consult for Coumadin Indication: atrial fibrillation  Allergies  Allergen Reactions  . Codeine Other (See Comments)    constipation  . Erythromycin-Sulfisoxazole Other (See Comments)    Causes infection in throat and eyes    Patient Measurements: Height: 5\' 8"  (172.7 cm) Weight: 281 lb 15.5 oz (127.9 kg) IBW/kg (Calculated) : 68.4  Vital Signs: Temp: 97.9 F (36.6 C) (12/31 0557) Temp Source: Oral (12/31 0557) BP: 154/76 (12/31 0557) Pulse Rate: 85 (12/31 0557)  Labs: Recent Labs    04/17/19 0622 04/18/19 0629 04/18/19 0857 04/19/19 0513  HGB  --   --  9.8*  --   HCT  --   --  33.4*  --   PLT  --   --  144*  --   LABPROT 33.0* 26.2*  --  24.5*  INR 3.2* 2.4*  --  2.2*  CREATININE 2.65* 1.98*  --  1.74*    Estimated Creatinine Clearance: 45.6 mL/min (A) (by C-G formula based on SCr of 1.74 mg/dL (H)).   Medical History: Past Medical History:  Diagnosis Date  . CAD S/P percutaneous coronary angioplasty    remote PCIs in 1990's- last CFX PCI 2008  . Chronic kidney disease, stage III (moderate)   . Diabetes mellitus with complication (HCC)    with hyperglycemia and diabetic autonomic poly neuropathy  . Gastro-esophageal reflux disease with esophagitis   . Hyperlipidemia   . Hypertension   . Ischemic cardiomyopathy 01/20/2018   St Jude ICD   . Morbid obesity ([place])   . Obstructive sleep apnea    on CPAP  . Polyneuropathy   . Primary insomnia     Medications:  See electronic med rec  Assessment: 78 y.o. M presents with confusion. Pt on coumadin PTA for afib. Admission INR 2.3 (therapeutic). CBC ok on admission Home dose: 2.5mg  daily except for 5mg  on Tues/Thur/Sat  INR 2.1>> 2.6 >3.7>4.1>4.3>3.2>2.4>2.2   Goal of Therapy:  INR 2-3 Monitor platelets by anticoagulation protocol: Yes   Plan:  Warfarin 5 mg x 1. . Daily INR Monitor for S/S of bleeding  Margot Ables, PharmD Clinical Pharmacist 04/19/2019  10:18 AM

## 2019-04-19 NOTE — Progress Notes (Signed)
CSW attempted to contact patients spouse Noah [name] and his daughter Noah [name] concerning bed offers. TOC team will continue to follow patient for discharge related needs. Pt is medically cleared and ready for discharge.

## 2019-04-19 NOTE — Progress Notes (Signed)
PROGRESS NOTE    Noah [name]  TTS:[id number] DOB: 08/17/1940 DOA: 04/10/2019 PCP: Manon Hilding, MD    Brief Narrative:  78 year old male with a history of diabetes, hypertension, obstructive sleep apnea, chronic kidney disease stage III, seizure disorder, admitted to the hospital with 1 day of confusion.  Basic work-up including CT head, blood cultures were found to be unrevealing.  He remains confused.  Further work-up in process.   Assessment & Plan:   Principal Problem:   Acute encephalopathy Active Problems:   DM (diabetes mellitus), type 2 with complications (HCC)   Chronic systolic (congestive) heart failure (HCC)   CAD S/P percutaneous coronary angioplasty   Atrial fibrillation (HCC)   CKD (chronic kidney disease) stage 3, GFR 30-59 ml/min   OSA (obstructive sleep apnea)   Epilepsy ([place])   1)Acute encephalopathy--more awake, more talkative, more interactive, more oriented  - no acute focal neurologic deficits.  No significant hypercarbia on blood gas.  Urinalysis, TSH, ammonia, B12 and RPR unrevealing.  Cannot perform MRI due to presence of pacemaker.  EEG did not show any epileptiform discharges. Wife does not report any recent medication changes and it is unlikely that he took any extra medications.  Patient had hypotension.  Likely related to volume depletion in the setting of antihypertensive medications.   Metoprolol, Lasix and Entresto have been discontinued.  He received IV fluid boluses.   -Cognitively improving, official neurology consult appreciated, may have had a seizure per neurologist, continue current antiseizure regimen adjusted for renal function  2)Coronary artery disease.  No complaints of chest pain.  Continue aspirin, statin and beta-blocker  3)Pneumonia--.  Patient noted to have small infiltrate at left base.  He does appear to be mildly short of breath and reports having a cough.--Cannot rule out aspiration component, okay to complete Augmentin.    4)Chronic systolic CHF.  EF 40-45%. Holding lasix and Entresto due to elevated creatinine.   5)Insulin dependent diabetes.  A1c was 8.7 earlier this month.  Continue on sliding scale for now.  6)AKI on chronic kidney disease stage III.  Baseline creatinine of 1.6..   Hold Lasix, hold Entresto, hold Metformin, renal ultrasound requested  -Nephrology consult appreciated -Creatinine is down to 1.7 from 3.5   7)Paroxysmal atrial fibrillation.  Rate controlled with metoprolol.  Anticoagulated with Coumadin.--- INR is 2.2  8)Epilepsy.  Continue on Keppra and Neurontin--dose adjusted for renal function  9)Obstructive sleep apnea.  Continue supplemental O2 at night.  Continue CPAP.  10)FEN--speech pathologist recommends regular solids and thin liquids  Disposition-improvement in renal function and mental status noted----Also awaiting transfer to SNF when bed is available   DVT prophylaxis: Warfarin Code Status: Full code Family Communication: Discussed with wife Disposition Plan: -SNF rehab  Consultants:     Procedures:     Antimicrobials:      Subjective: No new complaints  -No fevers no chest pains  Objective: Vitals:   04/18/19 2150 04/19/19 0500 04/19/19 0557 04/19/19 1435  BP: (!) 153/77  (!) 154/76 (!) 148/69  Pulse: 86  85 75  Resp: 20  20 (!) 22  Temp: 98.4 F (36.9 C)  97.9 F (36.6 C) 97.8 F (36.6 C)  TempSrc: Oral  Oral Oral  SpO2: 100%  100% 100%  Weight:  127.9 kg    Height:        Intake/Output Summary (Last 24 hours) at 04/19/2019 1913 Last data filed at 04/19/2019 1843 Gross per 24 hour  Intake 50 ml  Output 2175  ml  Net -2125 ml   Filed Weights   04/17/19 0500 04/18/19 0500 04/19/19 0500  Weight: 131.2 kg 129.5 kg 127.9 kg    Examination:  General exam: No distress, more awake,  Respiratory system: Clear to auscultation. Respiratory effort normal. Cardiovascular system: Irregular. No murmurs, rubs, gallops. Gastrointestinal  system: Abdomen is nondistended, soft and nontender.  Normal bowel sounds heard. Central nervous system: Generalized weakness, no focal neurological deficits. Extremities: 1+ edema bilaterally Skin: No rashes, lesions or ulcers Psychiatry: More interactive, oriented x3 GU-Foley with clear urine  Data Reviewed:   CBC: Recent Labs  Lab 04/13/19 0556 04/14/19 0555 04/15/19 0603 04/16/19 0625 04/18/19 0857  WBC 10.3 11.9* 9.6 9.3 9.5  HGB 10.8* 10.2* 10.8* 9.6* 9.8*  HCT 36.5* 35.6* 38.1* 33.6* 33.4*  MCV 94.6 96.2 98.2 98.2 94.4  PLT 186 165 150 108* 144*   Basic Metabolic Panel: Recent Labs  Lab 04/15/19 0603 04/16/19 0625 04/17/19 0622 04/18/19 0629 04/19/19 0513  NA 141 139 138 145 145  K 5.1 4.9 4.6 5.1 4.5  CL 105 100 103 106 107  CO2 23 25 26 28 28   GLUCOSE 176* 256* 223* 179* 142*  BUN 67* 72* 67* 52* 46*  CREATININE 3.29* 3.50* 2.65* 1.98* 1.74*  CALCIUM 8.2* 8.1* 7.7* 8.5* 8.6*  MG 1.9  --   --   --   --   PHOS  --  6.4* 4.4 2.2* 3.0   GFR: Estimated Creatinine Clearance: 45.6 mL/min (A) (by C-G formula based on SCr of 1.74 mg/dL (H)). Liver Function Tests: Recent Labs  Lab 04/16/19 0625 04/17/19 0622 04/18/19 0629 04/19/19 0513  ALBUMIN 2.7* 2.7* 2.7* 2.7*   No results for input(s): LIPASE, AMYLASE in the last 168 hours. No results for input(s): AMMONIA in the last 168 hours. Coagulation Profile: Recent Labs  Lab 04/15/19 0603 04/16/19 0625 04/17/19 0622 04/18/19 0629 04/19/19 0513  INR 4.1* 4.3* 3.2* 2.4* 2.2*   Cardiac Enzymes: No results for input(s): CKTOTAL, CKMB, CKMBINDEX, TROPONINI in the last 168 hours. BNP (last 3 results) No results for input(s): PROBNP in the last 8760 hours. HbA1C: No results for input(s): HGBA1C in the last 72 hours. CBG: Recent Labs  Lab 04/18/19 1627 04/18/19 2152 04/19/19 0740 04/19/19 1142 04/19/19 1659  GLUCAP 152* 145* 121* 144* 120*   Lipid Profile: No results for input(s): CHOL, HDL,  LDLCALC, TRIG, CHOLHDL, LDLDIRECT in the last 72 hours. Thyroid Function Tests: No results for input(s): TSH, T4TOTAL, FREET4, T3FREE, THYROIDAB in the last 72 hours. Anemia Panel: No results for input(s): VITAMINB12, FOLATE, FERRITIN, TIBC, IRON, RETICCTPCT in the last 72 hours. Sepsis Labs: No results for input(s): PROCALCITON, LATICACIDVEN in the last 168 hours.  Recent Results (from the past 240 hour(s))  SARS CORONAVIRUS 2 (TAT 6-24 HRS) Nasopharyngeal Nasopharyngeal Swab     Status: None   Collection Time: 04/10/19 10:20 PM   Specimen: Nasopharyngeal Swab  Result Value Ref Range Status   SARS Coronavirus 2 NEGATIVE NEGATIVE Final    Comment: (NOTE) SARS-CoV-2 target nucleic acids are NOT DETECTED. The SARS-CoV-2 RNA is generally detectable in upper and lower respiratory specimens during the acute phase of infection. Negative results do not preclude SARS-CoV-2 infection, do not rule out co-infections with other pathogens, and should not be used as the sole basis for treatment or other patient management decisions. Negative results must be combined with clinical observations, patient history, and epidemiological information. The expected result is Negative. Fact Sheet for Patients: 04/12/19 Fact  Sheet for Healthcare Providers: quierodirigir.com This test is not yet approved or cleared by the [place] FDA and  has been authorized for detection and/or diagnosis of SARS-CoV-2 by FDA under an Emergency Use Authorization (EUA). This EUA will remain  in effect (meaning this test can be used) for the duration of the COVID-19 declaration under Section 56 4(b)(1) of the Act, 21 U.S.C. section 360bbb-3(b)(1), unless the authorization is terminated or revoked sooner. Performed at [facility], [address], [place], [place] [address]   Urine culture     Status: Abnormal   Collection Time: 04/11/19 12:20 AM    Specimen: Urine, Random  Result Value Ref Range Status   Specimen Description   Final    URINE, RANDOM Performed at [facility], [address] [place], [place] [address]    Special Requests   Final    NONE Performed at [facility], [address], [place], [place] [address]    Culture (A)  Final    <10,000 COLONIES/mL INSIGNIFICANT GROWTH Performed at [facility], [address], [place], [place] [address]    Report Status 04/12/2019 FINAL  Final    Radiology Studies: No results found. Scheduled Meds: . amoxicillin-clavulanate  1 tablet Oral Q12H  . aspirin EC  81 mg Oral Daily  . atorvastatin  40 mg Oral q1800  . Chlorhexidine Gluconate Cloth  6 each Topical Daily  . famotidine  20 mg Oral QHS  . finasteride  5 mg Oral QPM  . FLUoxetine  20 mg Oral QPM  . gabapentin  100 mg Oral Daily  . gabapentin  200 mg Oral QHS  . guaiFENesin  15 mL Oral TID  . insulin aspart  0-9 Units Subcutaneous TID WC  . levETIRAcetam  750 mg Oral BID  . nystatin   Topical BID  . pantoprazole  40 mg Oral Daily  . sodium chloride flush  3 mL Intravenous Q12H  . sodium chloride flush  3 mL Intravenous Q12H  . tamsulosin  0.8 mg Oral QPM  . Warfarin - Pharmacist Dosing Inpatient   Does not apply q1800   Continuous Infusions: . sodium chloride      LOS: 7 days   Shon Hale, MD [facility]  If 7PM-7AM, please contact night-coverage [email or web address]  04/19/2019, 7:13 PM

## 2019-04-19 NOTE — Progress Notes (Signed)
SLP Cancellation Note  Patient Details Name: Noah [name] MRN: [id number] DOB: [birth year]   Cancelled treatment:       Reason Eval/Treat Not Completed: Patient declined, no reason specified; pt napping when SLP entered room; refused POs; ST will continue efforts in acute setting.   Elvina Sidle, M.S., [place] 04/19/2019, 12:16 PM

## 2019-04-20 LAB — GLUCOSE, CAPILLARY
Glucose-Capillary: 95 mg/dL (ref 70–99)
Glucose-Capillary: 159 mg/dL — ABNORMAL HIGH (ref 70–99)
Glucose-Capillary: 120 mg/dL — ABNORMAL HIGH (ref 70–99)
Glucose-Capillary: 136 mg/dL — ABNORMAL HIGH (ref 70–99)

## 2019-04-20 LAB — RENAL FUNCTION PANEL
Albumin: 2.5 g/dL — ABNORMAL LOW (ref 3.5–5.0)
Anion gap: 8 (ref 5–15)
BUN: 34 mg/dL — ABNORMAL HIGH (ref 8–23)
CO2: 27 mmol/L (ref 22–32)
Calcium: 8.6 mg/dL — ABNORMAL LOW (ref 8.9–10.3)
Chloride: 106 mmol/L (ref 98–111)
Creatinine, Ser: 1.41 mg/dL — ABNORMAL HIGH (ref 0.61–1.24)
GFR calc Af Amer: 55 mL/min — ABNORMAL LOW (ref >60)
GFR calc non Af Amer: 47 mL/min — ABNORMAL LOW (ref >60)
Glucose, Bld: 108 mg/dL — ABNORMAL HIGH (ref 70–99)
Phosphorus: 2.6 mg/dL (ref 2.5–4.6)
Potassium: 4.6 mmol/L (ref 3.5–5.1)
Sodium: 141 mmol/L (ref 135–145)

## 2019-04-20 LAB — PROTIME-INR
INR: 2.8 — ABNORMAL HIGH (ref 0.8–1.2)
Prothrombin Time: 29.7 seconds — ABNORMAL HIGH (ref 11.4–15.2)

## 2019-04-20 MED ORDER — WARFARIN SODIUM 1 MG PO TABS
1.0000 mg | ORAL_TABLET | Freq: Once | ORAL | Status: AC
  Administered 2019-04-20: 1 mg via ORAL
  Filled 2019-04-20: qty 1

## 2019-04-20 NOTE — Progress Notes (Signed)
PROGRESS NOTE    Noah [name]  RJJ:[id number] DOB: [birth year] DOA: 04/10/2019 PCP: Estanislado Pandy, MD    Brief Narrative:  79 year old male with a history of diabetes, hypertension, obstructive sleep apnea, chronic kidney disease stage III, seizure disorder, admitted to the hospital with 1 day of confusion.  Basic work-up including CT head, blood cultures were found to be unrevealing.  He was confused.  Much improved now  Assessment & Plan:   Principal Problem:   Acute encephalopathy Active Problems:   DM (diabetes mellitus), type 2 with complications (HCC)   Chronic systolic (congestive) heart failure (HCC)   CAD S/P percutaneous coronary angioplasty   Atrial fibrillation (HCC)   CKD (chronic kidney disease) stage 3, GFR 30-59 ml/min   OSA (obstructive sleep apnea)   Epilepsy (HCC)   1)Acute encephalopathy--more awake, more talkative, more interactive, more oriented  - no acute focal neurologic deficits.  No significant hypercarbia on blood gas.  Urinalysis, TSH, ammonia, B12 and RPR unrevealing.  Cannot perform MRI due to presence of pacemaker.  EEG did not show any epileptiform discharges. Wife does not report any recent medication changes and it is unlikely that he took any extra medications.  Patient had hypotension.  Likely related to volume depletion in the setting of antihypertensive medications.   Metoprolol, Lasix and Entresto have been discontinued.  He received IV fluid boluses.   -Cognitively much improved -- official neurology consult appreciated, may have had a seizure per neurologist, continue current antiseizure regimen adjusted for renal function  2)Coronary artery disease.  No complaints of chest pain.  Continue aspirin, statin and beta-blocker  3)Pneumonia--.  Patient noted to have small infiltrate at left base.  He does appear to be mildly short of breath and reports having a cough.--Cannot rule out aspiration component, okay to complete Augmentin.    4)Chronic systolic CHF.  EF 40-45%. Holding lasix and Entresto due to elevated creatinine.   5)Insulin dependent diabetes.  A1c was 8.7 earlier this month.  Continue on sliding scale for now.  6)AKI on chronic kidney disease stage III.  Baseline creatinine of 1.6..   Hold Lasix, hold Entresto, hold Metformin, renal ultrasound requested  -Nephrology consult appreciated -Creatinine is down to 1.4 from 3.5   7)Paroxysmal atrial fibrillation.  Rate controlled with metoprolol.  Anticoagulated with Coumadin.--- INR is 2.2  8)Epilepsy.  Continue on Keppra and Neurontin--dose adjusted for renal function  9)Obstructive sleep apnea.  Continue supplemental O2 at night.  Continue CPAP.  10)FEN--speech pathologist recommends regular solids and thin liquids  Disposition--- medically ready for discharge, awaiting transfer to SNF when bed is available   DVT prophylaxis: Warfarin Code Status: Full code Family Communication: Discussed with wife previously, left voicemail for wife Disposition Plan: -SNF rehab  Consultants:     Procedures:     Antimicrobials:      Subjective:  -Eating okay, no fever no chills no vomiting  Objective: Vitals:   04/19/19 0557 04/19/19 1435 04/19/19 2110 04/20/19 0541  BP: (!) 154/76 (!) 148/69 (!) 157/72 (!) 166/71  Pulse: 85 75 71 73  Resp: 20 (!) 22 18 18   Temp: 97.9 F (36.6 C) 97.8 F (36.6 C) 98.2 F (36.8 C) 98 F (36.7 C)  TempSrc: Oral Oral Oral Oral  SpO2: 100% 100% 95% 98%  Weight:    125.3 kg  Height:        Intake/Output Summary (Last 24 hours) at 04/20/2019 0746 Last data filed at 04/20/2019 0500 Gross per 24 hour  Intake 530 ml  Output 1875 ml  Net -1345 ml   Filed Weights   04/18/19 0500 04/19/19 0500 04/20/19 0541  Weight: 129.5 kg 127.9 kg 125.3 kg    Examination:  General exam: No distress, more awake,  Respiratory system: Clear to auscultation. Respiratory effort normal. Cardiovascular system: Irregular. No  murmurs, rubs, gallops. Gastrointestinal system: Abdomen is nondistended, soft and nontender.  Normal bowel sounds heard. Central nervous system: Generalized weakness, no focal neurological deficits. Extremities: 1+ edema bilaterally Skin: No rashes, lesions or ulcers Psychiatry: More interactive, oriented x3 GU-Foley with clear urine--- removed  Data Reviewed:   CBC: Recent Labs  Lab 04/14/19 0555 04/15/19 0603 04/16/19 0625 04/18/19 0857  WBC 11.9* 9.6 9.3 9.5  HGB 10.2* 10.8* 9.6* 9.8*  HCT 35.6* 38.1* 33.6* 33.4*  MCV 96.2 98.2 98.2 94.4  PLT 165 150 108* 144*   Basic Metabolic Panel: Recent Labs  Lab 04/15/19 0603 04/16/19 0625 04/17/19 0622 04/18/19 0629 04/19/19 0513 04/20/19 0543  NA 141 139 138 145 145 141  K 5.1 4.9 4.6 5.1 4.5 4.6  CL 105 100 103 106 107 106  CO2 23 25 26 28 28 27   GLUCOSE 176* 256* 223* 179* 142* 108*  BUN 67* 72* 67* 52* 46* 34*  CREATININE 3.29* 3.50* 2.65* 1.98* 1.74* 1.41*  CALCIUM 8.2* 8.1* 7.7* 8.5* 8.6* 8.6*  MG 1.9  --   --   --   --   --   PHOS  --  6.4* 4.4 2.2* 3.0 2.6   GFR: Estimated Creatinine Clearance: 55.7 mL/min (A) (by C-G formula based on SCr of 1.41 mg/dL (H)). Liver Function Tests: Recent Labs  Lab 04/16/19 0625 04/17/19 0622 04/18/19 0629 04/19/19 0513 04/20/19 0543  ALBUMIN 2.7* 2.7* 2.7* 2.7* 2.5*   No results for input(s): LIPASE, AMYLASE in the last 168 hours. No results for input(s): AMMONIA in the last 168 hours. Coagulation Profile: Recent Labs  Lab 04/16/19 0625 04/17/19 0622 04/18/19 0629 04/19/19 0513 04/20/19 0543  INR 4.3* 3.2* 2.4* 2.2* 2.8*   Cardiac Enzymes: No results for input(s): CKTOTAL, CKMB, CKMBINDEX, TROPONINI in the last 168 hours. BNP (last 3 results) No results for input(s): PROBNP in the last 8760 hours. HbA1C: No results for input(s): HGBA1C in the last 72 hours. CBG: Recent Labs  Lab 04/18/19 2152 04/19/19 0740 04/19/19 1142 04/19/19 1659 04/19/19 2111   GLUCAP 145* 121* 144* 120* 147*   Lipid Profile: No results for input(s): CHOL, HDL, LDLCALC, TRIG, CHOLHDL, LDLDIRECT in the last 72 hours. Thyroid Function Tests: No results for input(s): TSH, T4TOTAL, FREET4, T3FREE, THYROIDAB in the last 72 hours. Anemia Panel: No results for input(s): VITAMINB12, FOLATE, FERRITIN, TIBC, IRON, RETICCTPCT in the last 72 hours. Sepsis Labs: No results for input(s): PROCALCITON, LATICACIDVEN in the last 168 hours.  Recent Results (from the past 240 hour(s))  SARS CORONAVIRUS 2 (TAT 6-24 HRS) Nasopharyngeal Nasopharyngeal Swab     Status: None   Collection Time: 04/10/19 10:20 PM   Specimen: Nasopharyngeal Swab  Result Value Ref Range Status   SARS Coronavirus 2 NEGATIVE NEGATIVE Final    Comment: (NOTE) SARS-CoV-2 target nucleic acids are NOT DETECTED. The SARS-CoV-2 RNA is generally detectable in upper and lower respiratory specimens during the acute phase of infection. Negative results do not preclude SARS-CoV-2 infection, do not rule out co-infections with other pathogens, and should not be used as the sole basis for treatment or other patient management decisions. Negative results must be combined with clinical  observations, patient history, and epidemiological information. The expected result is Negative. Fact Sheet for Patients: SugarRoll.be Fact Sheet for Healthcare Providers: https://www.woods-mathews.com/ This test is not yet approved or cleared by the [place] FDA and  has been authorized for detection and/or diagnosis of SARS-CoV-2 by FDA under an Emergency Use Authorization (EUA). This EUA will remain  in effect (meaning this test can be used) for the duration of the COVID-19 declaration under Section 56 4(b)(1) of the Act, 21 U.S.C. section 360bbb-3(b)(1), unless the authorization is terminated or revoked sooner. Performed at [facility], [place] [address], [place],  [place] [address]   Urine culture     Status: Abnormal   Collection Time: 04/11/19 12:20 AM   Specimen: Urine, Random  Result Value Ref Range Status   Specimen Description   Final    URINE, RANDOM Performed at [facility], [address], [place], [place] [address]    Special Requests   Final    NONE Performed at [facility], [address], [place], [place] [address]    Culture (A)  Final    <10,000 COLONIES/mL INSIGNIFICANT GROWTH Performed at [facility], [place] [address], [place], [place] [address]    Report Status 04/12/2019 FINAL  Final    Radiology Studies: No results found. Scheduled Meds: . amoxicillin-clavulanate  1 tablet Oral Q12H  . aspirin EC  81 mg Oral Daily  . atorvastatin  40 mg Oral q1800  . Chlorhexidine Gluconate Cloth  6 each Topical Daily  . famotidine  20 mg Oral QHS  . finasteride  5 mg Oral QPM  . FLUoxetine  20 mg Oral QPM  . gabapentin  100 mg Oral Daily  . gabapentin  200 mg Oral QHS  . guaiFENesin  15 mL Oral TID  . insulin aspart  0-9 Units Subcutaneous TID WC  . levETIRAcetam  750 mg Oral BID  . nystatin   Topical BID  . pantoprazole  40 mg Oral Daily  . sodium chloride flush  3 mL Intravenous Q12H  . sodium chloride flush  3 mL Intravenous Q12H  . tamsulosin  0.8 mg Oral QPM  . Warfarin - Pharmacist Dosing Inpatient   Does not apply q1800   Continuous Infusions: . sodium chloride      LOS: 8 days   Roxan Hockey, MD [facility]  If 7PM-7AM, please contact night-coverage [email or web address]  04/20/2019, 7:46 AM

## 2019-04-20 NOTE — Progress Notes (Signed)
ANTICOAGULATION CONSULT NOTE - Pharmacy Consult for Coumadin Indication: atrial fibrillation  Allergies  Allergen Reactions  . Codeine Other (See Comments)    constipation  . Erythromycin-Sulfisoxazole Other (See Comments)    Causes infection in throat and eyes    Patient Measurements: Height: 5\' 8"  (172.7 cm) Weight: 276 lb 3.8 oz (125.3 kg) IBW/kg (Calculated) : 68.4  Vital Signs: Temp: 98 F (36.7 C) (01/01 0541) Temp Source: Oral (01/01 0541) BP: 166/71 (01/01 0541) Pulse Rate: 73 (01/01 0541)  Labs: Recent Labs    04/18/19 0629 04/18/19 0857 04/19/19 0513 04/20/19 0543  HGB  --  9.8*  --   --   HCT  --  33.4*  --   --   PLT  --  144*  --   --   LABPROT 26.2*  --  24.5* 29.7*  INR 2.4*  --  2.2* 2.8*  CREATININE 1.98*  --  1.74* 1.41*    Estimated Creatinine Clearance: 55.7 mL/min (A) (by C-G formula based on SCr of 1.41 mg/dL (H)).   Medical History: Past Medical History:  Diagnosis Date  . CAD S/P percutaneous coronary angioplasty    remote PCIs in 1990's- last CFX PCI 2008  . Chronic kidney disease, stage III (moderate)   . Diabetes mellitus with complication (HCC)    with hyperglycemia and diabetic autonomic poly neuropathy  . Gastro-esophageal reflux disease with esophagitis   . Hyperlipidemia   . Hypertension   . Ischemic cardiomyopathy 01/20/2018   St Jude ICD   . Morbid obesity (HCC)   . Obstructive sleep apnea    on CPAP  . Polyneuropathy   . Primary insomnia     Medications:  See electronic med rec  Assessment: 79 y.o. M presents with confusion. Pt on coumadin PTA for afib. Admission INR 2.3 (therapeutic). CBC ok on admission Home dose: 2.5mg  daily except for 5mg  on Tues/Thur/Sat  INR 2.1>> 2.6 >3.7>4.1>4.3>3.2>2.4>2.2>2.8 (decent jump)   Goal of Therapy:  INR 2-3 Monitor platelets by anticoagulation protocol: Yes   Plan:  Warfarin 1 mg x 1. . Daily INR Monitor for S/S of bleeding  70, PharmD, MBA,  BCGP Clinical Pharmacist  04/20/2019 10:19 AM

## 2019-04-20 NOTE — Progress Notes (Signed)
Patient refused CPAP will let know later.

## 2019-04-20 NOTE — Progress Notes (Signed)
CSW followed up with [facility], family's first preferred placement option. [facility] explains that they are not currently accepting admissions. CSW later followed up with [facility], who intially accepted patient on Hub. Debbie from [facility] report that they do not have a bed at the moment and is unsure when one would be available.   CSW in contact with [facility] to inform them of referral sent through the Hub. [facility] Admission coordinator states that she will review the referral. CSW sent out several other referrals through the Hub.   CSW informed patients wife, Noah [name] of bed placement status. Still awaiting bed offers. TOC team will continue to follow for discharge related needs.  Noah [name] Noah [name] LCSWA Transitions of Care  Clinical Social Worker  Ph: [phone]

## 2019-04-20 NOTE — Care Management Important Message (Signed)
Important Message  Patient Details  Name: Noah [name] MRN: [id number] Date of Birth: 02/19/1941   Medicare Important Message Given:  Yes(RN will deliver to patient)     Corey Harold 04/20/2019, 3:01 PM

## 2019-04-21 LAB — RENAL FUNCTION PANEL
Albumin: 2.5 g/dL — ABNORMAL LOW (ref 3.5–5.0)
Anion gap: 8 (ref 5–15)
BUN: 28 mg/dL — ABNORMAL HIGH (ref 8–23)
CO2: 28 mmol/L (ref 22–32)
Calcium: 9.1 mg/dL (ref 8.9–10.3)
Chloride: 107 mmol/L (ref 98–111)
Creatinine, Ser: 1.27 mg/dL — ABNORMAL HIGH (ref 0.61–1.24)
GFR calc Af Amer: >60 mL/min (ref >60)
GFR calc non Af Amer: 54 mL/min — ABNORMAL LOW (ref >60)
Glucose, Bld: 151 mg/dL — ABNORMAL HIGH (ref 70–99)
Phosphorus: 3 mg/dL (ref 2.5–4.6)
Potassium: 4.7 mmol/L (ref 3.5–5.1)
Sodium: 143 mmol/L (ref 135–145)

## 2019-04-21 LAB — GLUCOSE, CAPILLARY
Glucose-Capillary: 125 mg/dL — ABNORMAL HIGH (ref 70–99)
Glucose-Capillary: 189 mg/dL — ABNORMAL HIGH (ref 70–99)
Glucose-Capillary: 195 mg/dL — ABNORMAL HIGH (ref 70–99)
Glucose-Capillary: 205 mg/dL — ABNORMAL HIGH (ref 70–99)

## 2019-04-21 LAB — PROTIME-INR
INR: 3.3 — ABNORMAL HIGH (ref 0.8–1.2)
Prothrombin Time: 33.5 seconds — ABNORMAL HIGH (ref 11.4–15.2)

## 2019-04-21 MED ORDER — HYDROXYZINE HCL 25 MG PO TABS
25.0000 mg | ORAL_TABLET | Freq: Once | ORAL | Status: AC
  Administered 2019-04-21: 25 mg via ORAL
  Filled 2019-04-21: qty 1

## 2019-04-21 MED ORDER — LABETALOL HCL 5 MG/ML IV SOLN
10.0000 mg | INTRAVENOUS | Status: DC | PRN
  Administered 2019-04-21: 10 mg via INTRAVENOUS
  Filled 2019-04-21: qty 4

## 2019-04-21 MED ORDER — METOPROLOL TARTRATE 50 MG PO TABS
50.0000 mg | ORAL_TABLET | Freq: Two times a day (BID) | ORAL | Status: DC
  Administered 2019-04-21 – 2019-04-25 (×8): 50 mg via ORAL
  Filled 2019-04-21 (×9): qty 1

## 2019-04-21 MED ORDER — SACUBITRIL-VALSARTAN 24-26 MG PO TABS
1.0000 | ORAL_TABLET | Freq: Two times a day (BID) | ORAL | Status: DC
  Administered 2019-04-22 – 2019-04-25 (×7): 1 via ORAL
  Filled 2019-04-21 (×7): qty 1

## 2019-04-21 NOTE — Progress Notes (Signed)
ANTICOAGULATION CONSULT NOTE - Pharmacy Consult for Coumadin Indication: atrial fibrillation  Allergies  Allergen Reactions  . Codeine Other (See Comments)    constipation  . Erythromycin-Sulfisoxazole Other (See Comments)    Causes infection in throat and eyes    Patient Measurements: Height: 5\' 8"  (172.7 cm) Weight: 280 lb 6.8 oz (127.2 kg) IBW/kg (Calculated) : 68.4  Vital Signs: Temp: 98 F (36.7 C) (01/02 1349) Temp Source: Oral (01/02 0623) BP: 179/77 (01/02 1349) Pulse Rate: 72 (01/02 1349)  Labs: Recent Labs    04/19/19 0513 04/20/19 0543 04/21/19 0553 04/21/19 0554  LABPROT 24.5* 29.7* 33.5*  --   INR 2.2* 2.8* 3.3*  --   CREATININE 1.74* 1.41*  --  1.27*    Estimated Creatinine Clearance: 62.3 mL/min (A) (by C-G formula based on SCr of 1.27 mg/dL (H)).   Medical History: Past Medical History:  Diagnosis Date  . CAD S/P percutaneous coronary angioplasty    remote PCIs in 1990's- last CFX PCI 2008  . Chronic kidney disease, stage III (moderate)   . Diabetes mellitus with complication (HCC)    with hyperglycemia and diabetic autonomic poly neuropathy  . Gastro-esophageal reflux disease with esophagitis   . Hyperlipidemia   . Hypertension   . Ischemic cardiomyopathy 01/20/2018   St Jude ICD   . Morbid obesity (HCC)   . Obstructive sleep apnea    on CPAP  . Polyneuropathy   . Primary insomnia     Medications:  See electronic med rec  Assessment: 79 y.o. M presents with confusion. Pt on coumadin PTA for afib. Admission INR 2.3 (therapeutic). CBC ok on admission Home dose: 2.5mg  daily except for 5mg  on Tues/Thur/Sat  INR 2.1>> 2.6 >3.7>4.1>4.3>3.2>2.4>2.2>2.8 (decent jump)> 3.3    Goal of Therapy:  INR 2-3 Monitor platelets by anticoagulation protocol: Yes   Plan:  Warfarin hold tonights dose  Daily INR Monitor for S/S of bleeding  70, PharmD, MBA, BCGP Clinical Pharmacist  04/21/2019 1:51 PM

## 2019-04-21 NOTE — Progress Notes (Signed)
PROGRESS NOTE    Noah [name]  LZJ:[id number] DOB: 1940/08/02 DOA: 04/10/2019 PCP: Manon Hilding, MD    Brief Narrative:  79 year old male with a history of diabetes, hypertension, obstructive sleep apnea, chronic kidney disease stage III, seizure disorder, admitted to the hospital with 1 day of confusion.  Basic work-up including CT head, blood cultures were found to be unrevealing.  He was confused.  Much improved now  Assessment & Plan:   Principal Problem:   Acute encephalopathy Active Problems:   DM (diabetes mellitus), type 2 with complications (HCC)   Chronic systolic (congestive) heart failure (HCC)   CAD S/P percutaneous coronary angioplasty   Atrial fibrillation (HCC)   CKD (chronic kidney disease) stage 3, GFR 30-59 ml/min   OSA (obstructive sleep apnea)   Epilepsy ([place])   1)Acute metabolic encephalopathy--resolved at this time, more awake, more talkative, more interactive, more oriented  - no acute focal neurologic deficits.  No significant hypercarbia on blood gas.  Urinalysis, TSH, ammonia, B12 and RPR unrevealing.  Cannot perform MRI due to presence of pacemaker.  EEG did not show any epileptiform discharges. Wife does not report any recent medication changes and it is unlikely that he took any extra medications.  Patient had hypotension.  Likely related to volume depletion in the setting of antihypertensive medications.   Metoprolol, Lasix and Entresto have been discontinued.  He received IV fluid boluses.   -Cognitively much improved -- official neurology consult appreciated, may have had a seizure per neurologist, continue current antiseizure regimen   2)Coronary artery disease.  No complaints of chest pain.  Continue aspirin 81 mg daily, Lipitor 40 mg daily and restart metoprolol at 50 mg twice daily  3)Pneumonia--.  Patient noted to have small infiltrate at left base.  He does appear to be mildly short of breath and reports having a cough.--Cannot rule out  aspiration component, okay to complete Augmentin.   4)Chronic systolic CHF.  EF 40-45%.  -Continue to hold Lasix,  May restart Entresto at half dose on 04/22/2019   5)Insulin dependent diabetes.  A1c was 8.7 earlier this month.  Continue on sliding scale for now.  6)AKI on chronic kidney disease stage III.  Baseline creatinine of 1.6..   -Continue to hold Metformin,  renal ultrasound without obstructive uropathy -Nephrology consult appreciated  -Creatinine is down to 1.27 from 3.5  -Continue to hold Lasix, okay to restart half dose Entresto on 04/22/2019 monitor renal function closely  7)Paroxysmal atrial fibrillation.  Rate controlled with metoprolol.  Anticoagulated with Coumadin.--- INR is 2.2  8)Epilepsy--  Continue on Keppra and Neurontin--dose adjusted for renal function  9)Obstructive sleep apnea.  Continue supplemental O2 at night.  Continue CPAP.  10)FEN--speech pathologist recommends regular solids and thin liquids  11)Elevated blood pressure noted--- metoprolol 50 twice daily restarted as above, may restart Entresto on 04/21/2018 at half dose  may use IV labetalol when necessary  Every 4 hours for systolic blood pressure over 160 mmhg  Disposition--- medically ready for discharge, awaiting transfer to SNF when bed is available   DVT prophylaxis: Warfarin Code Status: Full code  Family Communication: Discussed with wife   Disposition Plan: -SNF rehab  Consultants:  -Nephrology  Procedures:     Antimicrobials:    Augmentin   Subjective: -No new concerns, voiding okay -Refuses to use CPAP from time to time -  Objective: Vitals:   04/20/19 2300 04/21/19 0500 04/21/19 0623 04/21/19 1349  BP:   (!) 166/67 (!) 179/77  Pulse:  73  63 72  Resp: 20  20 18   Temp:   (!) 97.5 F (36.4 C) 98 F (36.7 C)  TempSrc:   Oral   SpO2: 98%  98% 98%  Weight:  127.2 kg    Height:        Intake/Output Summary (Last 24 hours) at 04/21/2019 1526 Last data filed at 04/21/2019  1300 Gross per 24 hour  Intake 720 ml  Output --  Net 720 ml   Filed Weights   04/19/19 0500 04/20/19 0541 04/21/19 0500  Weight: 127.9 kg 125.3 kg 127.2 kg    Examination:  General exam: No distress, obese, in NAD  Respiratory system: Clear to auscultation. Respiratory effort normal. Cardiovascular system: Irregular. No murmurs, rubs, gallops. Gastrointestinal system: Abdomen is nondistended, soft and nontender.  Normal bowel sounds heard. Central nervous system: Generalized weakness, no focal neurological deficits. Extremities: 1+ edema bilaterally Skin: No rashes, lesions or ulcers Psychiatry: More interactive, oriented x3 GU-Foley with clear urine--- removed  Data Reviewed:   CBC: Recent Labs  Lab 04/15/19 0603 04/16/19 0625 04/18/19 0857  WBC 9.6 9.3 9.5  HGB 10.8* 9.6* 9.8*  HCT 38.1* 33.6* 33.4*  MCV 98.2 98.2 94.4  PLT 150 108* 144*   Basic Metabolic Panel: Recent Labs  Lab 04/15/19 0603 04/17/19 0622 04/18/19 0629 04/19/19 0513 04/20/19 0543 04/21/19 0554  NA 141 138 145 145 141 143  K 5.1 4.6 5.1 4.5 4.6 4.7  CL 105 103 106 107 106 107  CO2 23 26 28 28 27 28   GLUCOSE 176* 223* 179* 142* 108* 151*  BUN 67* 67* 52* 46* 34* 28*  CREATININE 3.29* 2.65* 1.98* 1.74* 1.41* 1.27*  CALCIUM 8.2* 7.7* 8.5* 8.6* 8.6* 9.1  MG 1.9  --   --   --   --   --   PHOS  --  4.4 2.2* 3.0 2.6 3.0   GFR: Estimated Creatinine Clearance: 62.3 mL/min (A) (by C-G formula based on SCr of 1.27 mg/dL (H)). Liver Function Tests: Recent Labs  Lab 04/17/19 0622 04/18/19 0629 04/19/19 0513 04/20/19 0543 04/21/19 0554  ALBUMIN 2.7* 2.7* 2.7* 2.5* 2.5*   No results for input(s): LIPASE, AMYLASE in the last 168 hours. No results for input(s): AMMONIA in the last 168 hours. Coagulation Profile: Recent Labs  Lab 04/17/19 0622 04/18/19 0629 04/19/19 0513 04/20/19 0543 04/21/19 0553  INR 3.2* 2.4* 2.2* 2.8* 3.3*   Cardiac Enzymes: No results for input(s): CKTOTAL,  CKMB, CKMBINDEX, TROPONINI in the last 168 hours. BNP (last 3 results) No results for input(s): PROBNP in the last 8760 hours. HbA1C: No results for input(s): HGBA1C in the last 72 hours. CBG: Recent Labs  Lab 04/20/19 1203 04/20/19 1659 04/20/19 2031 04/21/19 0748 04/21/19 1124  GLUCAP 159* 120* 136* 125* 189*   Lipid Profile: No results for input(s): CHOL, HDL, LDLCALC, TRIG, CHOLHDL, LDLDIRECT in the last 72 hours. Thyroid Function Tests: No results for input(s): TSH, T4TOTAL, FREET4, T3FREE, THYROIDAB in the last 72 hours. Anemia Panel: No results for input(s): VITAMINB12, FOLATE, FERRITIN, TIBC, IRON, RETICCTPCT in the last 72 hours. Sepsis Labs: No results for input(s): PROCALCITON, LATICACIDVEN in the last 168 hours.  No results found for this or any previous visit (from the past 240 hour(s)).  Radiology Studies: No results found. Scheduled Meds:  amoxicillin-clavulanate  1 tablet Oral Q12H   aspirin EC  81 mg Oral Daily   atorvastatin  40 mg Oral q1800   Chlorhexidine Gluconate Cloth  6 each  Topical Daily   famotidine  20 mg Oral QHS   finasteride  5 mg Oral QPM   FLUoxetine  20 mg Oral QPM   gabapentin  100 mg Oral Daily   gabapentin  200 mg Oral QHS   guaiFENesin  15 mL Oral TID   insulin aspart  0-9 Units Subcutaneous TID WC   levETIRAcetam  750 mg Oral BID   metoprolol tartrate  50 mg Oral BID   nystatin   Topical BID   pantoprazole  40 mg Oral Daily   [START ON 04/22/2019] sacubitril-valsartan  1 tablet Oral BID   sodium chloride flush  3 mL Intravenous Q12H   sodium chloride flush  3 mL Intravenous Q12H   tamsulosin  0.8 mg Oral QPM   Warfarin - Pharmacist Dosing Inpatient   Does not apply q1800   Continuous Infusions:  sodium chloride      LOS: 9 days   Shon Hale, MD [facility]  If 7PM-7AM, please contact night-coverage [email or web address]  04/21/2019, 3:26 PM

## 2019-04-22 LAB — RENAL FUNCTION PANEL
Albumin: 2.8 g/dL — ABNORMAL LOW (ref 3.5–5.0)
Anion gap: 9 (ref 5–15)
BUN: 22 mg/dL (ref 8–23)
CO2: 29 mmol/L (ref 22–32)
Calcium: 9.2 mg/dL (ref 8.9–10.3)
Chloride: 103 mmol/L (ref 98–111)
Creatinine, Ser: 1.23 mg/dL (ref 0.61–1.24)
GFR calc Af Amer: >60 mL/min (ref >60)
GFR calc non Af Amer: 56 mL/min — ABNORMAL LOW (ref >60)
Glucose, Bld: 141 mg/dL — ABNORMAL HIGH (ref 70–99)
Phosphorus: 3.2 mg/dL (ref 2.5–4.6)
Potassium: 4.5 mmol/L (ref 3.5–5.1)
Sodium: 141 mmol/L (ref 135–145)

## 2019-04-22 LAB — GLUCOSE, CAPILLARY
Glucose-Capillary: 118 mg/dL — ABNORMAL HIGH (ref 70–99)
Glucose-Capillary: 157 mg/dL — ABNORMAL HIGH (ref 70–99)
Glucose-Capillary: 174 mg/dL — ABNORMAL HIGH (ref 70–99)

## 2019-04-22 LAB — PROTIME-INR
INR: 2.6 — ABNORMAL HIGH (ref 0.8–1.2)
Prothrombin Time: 27.8 seconds — ABNORMAL HIGH (ref 11.4–15.2)

## 2019-04-22 MED ORDER — WARFARIN SODIUM 1 MG PO TABS
1.0000 mg | ORAL_TABLET | Freq: Once | ORAL | Status: AC
  Administered 2019-04-22: 1 mg via ORAL
  Filled 2019-04-22: qty 1

## 2019-04-22 NOTE — Progress Notes (Signed)
PROGRESS NOTE    Noah [name]  NWG:[id number] DOB: 01-21-1941 DOA: 04/10/2019 PCP: Manon Hilding, MD    Brief Narrative:  79 year old male with a history of diabetes, hypertension, obstructive sleep apnea, chronic kidney disease stage III, seizure disorder, admitted to the hospital with 1 day of confusion.  Basic work-up including CT head, blood cultures were found to be unrevealing.  He was confused.  Much improved now -Currently medically stable, awaiting transfer to SNF when bed is available    Assessment & Plan:   Principal Problem:   Acute encephalopathy Active Problems:   DM (diabetes mellitus), type 2 with complications (HCC)   Chronic systolic (congestive) heart failure (HCC)   CAD S/P percutaneous coronary angioplasty   Atrial fibrillation (HCC)   CKD (chronic kidney disease) stage 3, GFR 30-59 ml/min   OSA (obstructive sleep apnea)   Epilepsy ([place])   1)Acute metabolic encephalopathy--resolved at this time, more awake, more talkative, more interactive, more oriented  - no acute focal neurologic deficits.  No significant hypercarbia on blood gas.  Urinalysis, TSH, ammonia, B12 and RPR unrevealing.  Could not perform MRI due to presence of pacemaker.  EEG did not show any epileptiform discharges. Wife does not report any recent medication changes and it is unlikely that he took any extra medications.  Patient had hypotension.  Likely related to volume depletion in the setting of antihypertensive medications.   Metoprolol, Lasix and Entresto have been discontinued.  He received IV fluid boluses.   -Cognitively appears back to baseline much improved -- official Neurology consult appreciated, may have had a seizure per neurologist, continue current anti-seizure regimen   2)Coronary artery disease--- asymptomatic,  Continue aspirin 81 mg daily, Lipitor 40 mg daily and c/n Metoprolol at 50 mg twice daily  3)Pneumonia--.  Patient noted to have small infiltrate at left base.  He  does appear to be mildly short of breath and reports having a cough.--Cannot rule out aspiration component, okay to completed Augmentin -last dose 04/21/2018.  -Continue supplemental  oxygen  4)Chronic systolic CHF.  EF 40-45%--appears euvolemic, stable, -Continue to hold Lasix,  Restarted Entresto at half dose on 04/22/2019   5)Insulin dependent diabetes.  A1c was 8.7 earlier this month.  Continue on sliding scale for now.  6)AKI on chronic kidney disease stage III.  Baseline creatinine of 1.6..   -Continue to hold Metformin,  renal ultrasound without obstructive uropathy -Nephrology consult appreciated  -Creatinine is down to 1.2 from 3.5  -Continue to hold Lasix, - Restarted half dose Entresto on 04/22/2019 - monitor renal function closely  7)Paroxysmal atrial fibrillation.  Rate controlled with metoprolol.  Anticoagulated with Coumadin.--- INR is 2.2  8)Epilepsy--  Continue on Keppra and Neurontin--dose adjusted for renal function  9)Obstructive sleep apnea.  Continue supplemental O2 at night.  Continue CPAP.  10)FEN--speech pathologist recommends regular solids and thin liquids  11)Elevated blood pressure noted--- metoprolol 50 twice daily restarted as above, Restarted Entresto on 04/21/2018 at half dose  may use IV labetalol when necessary  Every 4 hours for systolic blood pressure over 160 mmhg  Disposition--- medically ready for discharge, awaiting transfer to SNF when bed is available   DVT prophylaxis: Warfarin Code Status: Full code  Family Communication: Discussed with wife   Disposition Plan: -SNF rehab  Consultants:  -Nephrology  Procedures:     Antimicrobials:    Augmentin   Subjective: -Eating or drinking well, no fevers, no new concerns -  Objective: Vitals:   04/21/19 0623 04/21/19  1349 04/21/19 2137 04/22/19 0521  BP: (!) 166/67 (!) 179/77 (!) 126/55 (!) 147/71  Pulse: 63 72 62 66  Resp: 20 18 19 18   Temp: (!) 97.5 F (36.4 C) 98 F (36.7 C) 97.8  F (36.6 C) 98 F (36.7 C)  TempSrc: Oral   Oral  SpO2: 98% 98% 100% 98%  Weight:      Height:        Intake/Output Summary (Last 24 hours) at 04/22/2019 1125 Last data filed at 04/22/2019 0700 Gross per 24 hour  Intake 720 ml  Output 1000 ml  Net -280 ml   Filed Weights   04/19/19 0500 04/20/19 0541 04/21/19 0500  Weight: 127.9 kg 125.3 kg 127.2 kg    Examination:  General exam: No distress, obese, in NAD  Respiratory system: Clear to auscultation. Respiratory effort normal. Cardiovascular system: Irregular. No murmurs, rubs, gallops. Gastrointestinal system: Abdomen is nondistended, soft and nontender.  Normal bowel sounds heard. Central nervous system: Generalized weakness, no focal neurological deficits. Extremities: 1+ edema bilaterally Skin: No rashes, lesions or ulcers Psychiatry: More interactive, oriented x3 GU-Foley with clear urine--- removed  Data Reviewed:   CBC: Recent Labs  Lab 04/16/19 0625 04/18/19 0857  WBC 9.3 9.5  HGB 9.6* 9.8*  HCT 33.6* 33.4*  MCV 98.2 94.4  PLT 108* 144*   Basic Metabolic Panel: Recent Labs  Lab 04/18/19 0629 04/19/19 0513 04/20/19 0543 04/21/19 0554 04/22/19 0854  NA 145 145 141 143 141  K 5.1 4.5 4.6 4.7 4.5  CL 106 107 106 107 103  CO2 28 28 27 28 29   GLUCOSE 179* 142* 108* 151* 141*  BUN 52* 46* 34* 28* 22  CREATININE 1.98* 1.74* 1.41* 1.27* 1.23  CALCIUM 8.5* 8.6* 8.6* 9.1 9.2  PHOS 2.2* 3.0 2.6 3.0 3.2   GFR: Estimated Creatinine Clearance: 64.3 mL/min (by C-G formula based on SCr of 1.23 mg/dL). Liver Function Tests: Recent Labs  Lab 04/18/19 0629 04/19/19 0513 04/20/19 0543 04/21/19 0554 04/22/19 0854  ALBUMIN 2.7* 2.7* 2.5* 2.5* 2.8*   No results for input(s): LIPASE, AMYLASE in the last 168 hours. No results for input(s): AMMONIA in the last 168 hours. Coagulation Profile: Recent Labs  Lab 04/18/19 0629 04/19/19 0513 04/20/19 0543 04/21/19 0553 04/22/19 0854  INR 2.4* 2.2* 2.8* 3.3*  2.6*   Cardiac Enzymes: No results for input(s): CKTOTAL, CKMB, CKMBINDEX, TROPONINI in the last 168 hours. BNP (last 3 results) No results for input(s): PROBNP in the last 8760 hours. HbA1C: No results for input(s): HGBA1C in the last 72 hours. CBG: Recent Labs  Lab 04/21/19 1124 04/21/19 1623 04/21/19 2035 04/22/19 0733 04/22/19 1100  GLUCAP 189* 195* 205* 118* 157*   Lipid Profile: No results for input(s): CHOL, HDL, LDLCALC, TRIG, CHOLHDL, LDLDIRECT in the last 72 hours. Thyroid Function Tests: No results for input(s): TSH, T4TOTAL, FREET4, T3FREE, THYROIDAB in the last 72 hours. Anemia Panel: No results for input(s): VITAMINB12, FOLATE, FERRITIN, TIBC, IRON, RETICCTPCT in the last 72 hours. Sepsis Labs: No results for input(s): PROCALCITON, LATICACIDVEN in the last 168 hours.  No results found for this or any previous visit (from the past 240 hour(s)).  Radiology Studies: No results found. Scheduled Meds: . aspirin EC  81 mg Oral Daily  . atorvastatin  40 mg Oral q1800  . Chlorhexidine Gluconate Cloth  6 each Topical Daily  . famotidine  20 mg Oral QHS  . finasteride  5 mg Oral QPM  . FLUoxetine  20 mg Oral  QPM  . gabapentin  100 mg Oral Daily  . gabapentin  200 mg Oral QHS  . guaiFENesin  15 mL Oral TID  . insulin aspart  0-9 Units Subcutaneous TID WC  . levETIRAcetam  750 mg Oral BID  . metoprolol tartrate  50 mg Oral BID  . nystatin   Topical BID  . pantoprazole  40 mg Oral Daily  . sacubitril-valsartan  1 tablet Oral BID  . sodium chloride flush  3 mL Intravenous Q12H  . sodium chloride flush  3 mL Intravenous Q12H  . tamsulosin  0.8 mg Oral QPM  . warfarin  1 mg Oral ONCE-1800  . Warfarin - Pharmacist Dosing Inpatient   Does not apply q1800   Continuous Infusions: . sodium chloride      LOS: 10 days   Shon Hale, MD [facility]  If 7PM-7AM, please contact night-coverage [email or web address]  04/22/2019, 11:25 AM

## 2019-04-22 NOTE — Progress Notes (Signed)
ANTICOAGULATION CONSULT NOTE - Pharmacy Consult for Coumadin Indication: atrial fibrillation  Allergies  Allergen Reactions  . Codeine Other (See Comments)    constipation  . Erythromycin-Sulfisoxazole Other (See Comments)    Causes infection in throat and eyes    Patient Measurements: Height: 5\' 8"  (172.7 cm) Weight: 280 lb 6.8 oz (127.2 kg) IBW/kg (Calculated) : 68.4  Vital Signs: Temp: 98 F (36.7 C) (01/03 0521) Temp Source: Oral (01/03 0521) BP: 147/71 (01/03 0521) Pulse Rate: 66 (01/03 0521)  Labs: Recent Labs    04/20/19 0543 04/21/19 0553 04/21/19 0554 04/22/19 0854  LABPROT 29.7* 33.5*  --  27.8*  INR 2.8* 3.3*  --  2.6*  CREATININE 1.41*  --  1.27* 1.23    Estimated Creatinine Clearance: 64.3 mL/min (by C-G formula based on SCr of 1.23 mg/dL).   Medical History: Past Medical History:  Diagnosis Date  . CAD S/P percutaneous coronary angioplasty    remote PCIs in 1990's- last CFX PCI 2008  . Chronic kidney disease, stage III (moderate)   . Diabetes mellitus with complication (HCC)    with hyperglycemia and diabetic autonomic poly neuropathy  . Gastro-esophageal reflux disease with esophagitis   . Hyperlipidemia   . Hypertension   . Ischemic cardiomyopathy 01/20/2018   St Jude ICD   . Morbid obesity (HCC)   . Obstructive sleep apnea    on CPAP  . Polyneuropathy   . Primary insomnia     Medications:  See electronic med rec  Assessment: 79 y.o. M presents with confusion. Pt on coumadin PTA for afib. Admission INR 2.3 (therapeutic). CBC ok on admission Home dose: 2.5mg  daily except for 5mg  on Tues/Thur/Sat  INR 2.1>> 2.6 >3.7>4.1>4.3>3.2>2.4>2.2>2.8 (decent jump)> 3.3 > 2.6   Goal of Therapy:  INR 2-3 Monitor platelets by anticoagulation protocol: Yes   Plan:  Warfarin 1mg  x 1  Daily INR  Monitor for S/S of bleeding  70, PharmD, MBA, BCGP Clinical Pharmacist  04/22/2019 11:04 AM

## 2019-04-23 LAB — CBC
HCT: 34.8 % — ABNORMAL LOW (ref 39.0–52.0)
Hemoglobin: 10.4 g/dL — ABNORMAL LOW (ref 13.0–17.0)
MCH: 28.2 pg (ref 26.0–34.0)
MCHC: 29.9 g/dL — ABNORMAL LOW (ref 30.0–36.0)
MCV: 94.3 fL (ref 80.0–100.0)
Platelets: 209 10*3/uL (ref 150–400)
RBC: 3.69 MIL/uL — ABNORMAL LOW (ref 4.22–5.81)
RDW: 12.7 % (ref 11.5–15.5)
WBC: 9.9 10*3/uL (ref 4.0–10.5)
nRBC: 0 % (ref 0.0–0.2)

## 2019-04-23 LAB — GLUCOSE, CAPILLARY
Glucose-Capillary: 177 mg/dL — ABNORMAL HIGH (ref 70–99)
Glucose-Capillary: 173 mg/dL — ABNORMAL HIGH (ref 70–99)
Glucose-Capillary: 185 mg/dL — ABNORMAL HIGH (ref 70–99)
Glucose-Capillary: 219 mg/dL — ABNORMAL HIGH (ref 70–99)

## 2019-04-23 LAB — RENAL FUNCTION PANEL
Albumin: 2.6 g/dL — ABNORMAL LOW (ref 3.5–5.0)
Anion gap: 7 (ref 5–15)
BUN: 24 mg/dL — ABNORMAL HIGH (ref 8–23)
CO2: 30 mmol/L (ref 22–32)
Calcium: 8.9 mg/dL (ref 8.9–10.3)
Chloride: 103 mmol/L (ref 98–111)
Creatinine, Ser: 1.25 mg/dL — ABNORMAL HIGH (ref 0.61–1.24)
GFR calc Af Amer: >60 mL/min (ref >60)
GFR calc non Af Amer: 55 mL/min — ABNORMAL LOW (ref >60)
Glucose, Bld: 212 mg/dL — ABNORMAL HIGH (ref 70–99)
Phosphorus: 2.9 mg/dL (ref 2.5–4.6)
Potassium: 4.6 mmol/L (ref 3.5–5.1)
Sodium: 140 mmol/L (ref 135–145)

## 2019-04-23 LAB — RESPIRATORY PANEL BY RT PCR (FLU A&B, COVID)
Influenza A by PCR: NEGATIVE
Influenza B by PCR: NEGATIVE
SARS Coronavirus 2 by RT PCR: NEGATIVE

## 2019-04-23 LAB — PROTIME-INR
INR: 2.2 — ABNORMAL HIGH (ref 0.8–1.2)
Prothrombin Time: 24.2 seconds — ABNORMAL HIGH (ref 11.4–15.2)

## 2019-04-23 MED ORDER — WARFARIN SODIUM 2 MG PO TABS
2.0000 mg | ORAL_TABLET | Freq: Once | ORAL | Status: AC
  Administered 2019-04-23: 2 mg via ORAL
  Filled 2019-04-23: qty 1

## 2019-04-23 NOTE — Progress Notes (Signed)
  Speech Language Pathology Treatment: Dysphagia  Patient Details Name: Noah [name] MRN: [id number] DOB: [birth year] Today's Date: 04/23/2019 Time: [id number] SLP Time Calculation (min) (ACUTE ONLY): 15 min  Assessment / Plan / Recommendation Clinical Impression  Pt seen for follow up dysphagia intervention during lunch meal. Pt is difficult to position in bed due to increased body habitus. He was able to self feed after set up assist. He presented with delayed cough after rice and Pt was reminded to decrease rate and avoid talking during po consumption. Pt would benefit from sitting up in chair for all meals to reduce aspiration risk. Pt will d/c to SNF when bed available. SLP will sign off at this time.    HPI HPI: 79 year old male with a history of diabetes, hypertension, obstructive sleep apnea, chronic kidney disease stage III, seizure disorder, admitted to the hospital with 1 day of confusion.  Basic work-up including CT head, blood cultures were found to be unrevealing.  He remains confused. RN reported that Pt was clearing his throat, coughing, and gagging after breakfast meal this AM and requested BSE.       SLP Plan  All goals met;Discharge SLP treatment due to (comment)       Recommendations  Diet recommendations: Regular;Thin liquid Liquids provided via: Cup;Straw Medication Administration: Whole meds with liquid Supervision: Patient able to self feed;Intermittent supervision to cue for compensatory strategies Compensations: Slow rate Postural Changes and/or Swallow Maneuvers: Seated upright 90 degrees;Upright 30-60 min after meal                Oral Care Recommendations: Oral care BID Follow up Recommendations: None SLP Visit Diagnosis: Dysphagia, unspecified (R13.10) Plan: All goals met;Discharge SLP treatment due to (comment)       Thank you,  Genene Churn, [place]                 [place] 04/23/2019, 1:12 PM

## 2019-04-23 NOTE — Progress Notes (Signed)
Physical Therapy Treatment Patient Details Name: Noah [name] MRN: [id number] DOB: [birth year] Today's Date: 04/23/2019    History of Present Illness Noah [name] is a 79 y.o. male with medical history significant for CAD, ischemic cardiomyopathy, insulin-dependent diabetes mellitus, OSA, chronic 3 L/min supplemental oxygen requirement, chronic kidney disease stage III, and seizures, presenting to the emergency department with 1 day of confusion.  Patient was reportedly in his usual state yesterday which is alert and fully oriented, but has been confused today per report of his family.  The patient also acknowledges feeling confused, but denies any headache, change in vision or hearing, or new focal numbness or weakness.  There has not been any recent fall or trauma reported and the patient is not known to use alcohol or illicit substances.    PT Comments    Patient requires mod assist along with verbal  Cueing for transitioning to EOB. He is able to complete seated exercises without c/o fatigue. He transitions to standing with using RW and mod assist with verbal cueing for sequencing and UE placement. Patient limited to a few small shuffled steps in room with RW secondary to impaired activity tolerance. Patient performs all tasks today slowly and requires rest breaks. He has c/o discomfort from condom catheter throughout today's session. Patient left in chair - RN notified. Patient will benefit from continued physical therapy in hospital and recommended venue below to increase strength, balance, endurance for safe ADLs and gait.    Follow Up Recommendations  SNF;Supervision for mobility/OOB;Supervision - Intermittent     Equipment Recommendations  None recommended by PT    Recommendations for Other Services       Precautions / Restrictions Precautions Precautions: Fall Restrictions Weight Bearing Restrictions: No    Mobility  Bed Mobility Overal bed mobility: Needs Assistance Bed  Mobility: Supine to Sit     Supine to sit: Mod assist;HOB elevated     General bed mobility comments: requires use of bed rail and HOB elevated and frequent verbal/tactile cueing for proper hand placement  and sequencing  Transfers Overall transfer level: Needs assistance Equipment used: Rolling walker (2 wheeled) Transfers: Sit to/from [employer] Sit to Stand: Min assist;Mod assist Stand pivot transfers: Min assist;Mod assist       General transfer comment: slow labored movement  Ambulation/Gait Ambulation/Gait assistance: Min assist Gait Distance (Feet): 8 Feet Assistive device: Rolling walker (2 wheeled) Gait Pattern/deviations: Decreased step length - right;Decreased step length - left;Decreased stride length;Shuffle Gait velocity: slow   General Gait Details: patient ambulates with small shuffled steps with RW and assist, slow and labored movements   Stairs             Wheelchair Mobility    Modified Rankin (Stroke Patients Only)       Balance Overall balance assessment: Needs assistance Sitting-balance support: Feet supported;No upper extremity supported Sitting balance-Leahy Scale: Good Sitting balance - Comments: seated EOB   Standing balance support: During functional activity;Bilateral upper extremity supported Standing balance-Leahy Scale: Fair Standing balance comment: using RW                            Cognition Arousal/Alertness: Awake/alert Behavior During Therapy: WFL for tasks assessed/performed Overall Cognitive Status: Within Functional Limits for tasks assessed  Exercises General Exercises - Lower Extremity Ankle Circles/Pumps: AROM;Seated;Both;20 reps Long Arc Quad: AROM;Both;20 reps;Seated Hip Flexion/Marching: AROM;Both;Seated;20 reps Toe Raises: AROM;Both;20 reps;Seated Heel Raises: AROM;Both;20 reps;Seated    General Comments         Pertinent Vitals/Pain Pain Assessment: No/denies pain    Home Living                      Prior Function            PT Goals (current goals can now be found in the care plan section) Acute Rehab PT Goals Patient Stated Goal: return home with family to assist PT Goal Formulation: With patient Time For Goal Achievement: 04/30/19 Potential to Achieve Goals: Good Progress towards PT goals: Progressing toward goals    Frequency    Min 3X/week      PT Plan Current plan remains appropriate    Co-evaluation              AM-PAC PT "6 Clicks" Mobility   Outcome Measure  Help needed turning from your back to your side while in a flat bed without using bedrails?: A Lot Help needed moving from lying on your back to sitting on the side of a flat bed without using bedrails?: A Lot Help needed moving to and from a bed to a chair (including a wheelchair)?: A Lot Help needed standing up from a chair using your arms (e.g., wheelchair or bedside chair)?: A Lot Help needed to walk in hospital room?: A Little Help needed climbing 3-5 steps with a railing? : A Lot 6 Click Score: 13    End of Session Equipment Utilized During Treatment: Oxygen Activity Tolerance: Patient tolerated treatment well;Patient limited by fatigue Patient left: in chair;with call bell/phone within reach Nurse Communication: Mobility status PT Visit Diagnosis: Unsteadiness on feet (R26.81);Other abnormalities of gait and mobility (R26.89);Muscle weakness (generalized) (M62.81)     Time: 2023-3435 PT Time Calculation (min) (ACUTE ONLY): 39 min  Charges:  $Therapeutic Exercise: 8-22 mins $Therapeutic Activity: 23-37 mins                     3:45 PM, 04/23/19 Mearl Latin PT, DPT Physical Therapist at [facility]

## 2019-04-23 NOTE — Progress Notes (Signed)
CSW in contact with the following SNF to inquire about male bed availability:  -[facility] -[facility] -[facility] -[facility] - [facility] ( insurance not in network)   All who has replied that they have no male availability at this time and/or facility is not currently accepting any referrals at this time.   CSW currently awaiting to hear back from [facility] and [facility] concerning referral.   Hilbert Odor Transitions of Care  Clinical Social Worker  Ph: [phone]

## 2019-04-23 NOTE — Progress Notes (Signed)
ANTICOAGULATION CONSULT NOTE - Pharmacy Consult for Coumadin Indication: atrial fibrillation  Allergies  Allergen Reactions  . Codeine Other (See Comments)    constipation  . Erythromycin-Sulfisoxazole Other (See Comments)    Causes infection in throat and eyes    Patient Measurements: Height: 5\' 8"  (172.7 cm) Weight: 284 lb 6.3 oz (129 kg) IBW/kg (Calculated) : 68.4  Vital Signs: Temp: 97.8 F (36.6 C) (01/04 0540) Temp Source: Oral (01/04 0540) BP: 132/60 (01/04 0540) Pulse Rate: 63 (01/04 0540)  Labs: Recent Labs    04/21/19 0553 04/21/19 0554 04/22/19 0854 04/23/19 0546  HGB  --   --   --  10.4*  HCT  --   --   --  34.8*  PLT  --   --   --  209  LABPROT 33.5*  --  27.8* 24.2*  INR 3.3*  --  2.6* 2.2*  CREATININE  --  1.27* 1.23 1.25*    Estimated Creatinine Clearance: 63.8 mL/min (A) (by C-G formula based on SCr of 1.25 mg/dL (H)).   Medical History: Past Medical History:  Diagnosis Date  . CAD S/P percutaneous coronary angioplasty    remote PCIs in 1990's- last CFX PCI 2008  . Chronic kidney disease, stage III (moderate)   . Diabetes mellitus with complication (HCC)    with hyperglycemia and diabetic autonomic poly neuropathy  . Gastro-esophageal reflux disease with esophagitis   . Hyperlipidemia   . Hypertension   . Ischemic cardiomyopathy 01/20/2018   St Jude ICD   . Morbid obesity (HCC)   . Obstructive sleep apnea    on CPAP  . Polyneuropathy   . Primary insomnia     Medications:  See electronic med rec  Assessment: 79 y.o. M presents with confusion. Pt on coumadin PTA for afib. Admission INR 2.3 (therapeutic). CBC ok on admission Home dose: 2.5mg  daily except for 5mg  on Tues/Thur/Sat  INR 2.1>> 2.6 >3.7>4.1>4.3>3.2>2.4>2.2>2.8 (decent jump)> 3.3 > 2.6>> 2.2   Goal of Therapy:  INR 2-3 Monitor platelets by anticoagulation protocol: Yes   Plan:  Warfarin 2mg  x 1 today Daily INR  Monitor for S/S of bleeding  70, BS , BCPS Clinical Pharmacist Pager [phone] 04/23/2019 12:46 PM

## 2019-04-23 NOTE — Progress Notes (Signed)
PROGRESS NOTE    Noah [name]  UJW:[id number] DOB: 05/08/1940 DOA: 04/10/2019 PCP: Estanislado Pandy, MD    Brief Narrative:  79 year old male with a history of diabetes, hypertension, obstructive sleep apnea, chronic kidney disease stage III, seizure disorder, admitted to the hospital with 1 day of confusion.  Basic work-up including CT head, blood cultures were found to be unrevealing.  He was confused.  Much improved now -Currently medically stable, awaiting transfer to SNF when bed is available    Assessment & Plan:   Principal Problem:   Acute encephalopathy Active Problems:   DM (diabetes mellitus), type 2 with complications (HCC)   Chronic systolic (congestive) heart failure (HCC)   CAD S/P percutaneous coronary angioplasty   Atrial fibrillation (HCC)   CKD (chronic kidney disease) stage 3, GFR 30-59 ml/min   OSA (obstructive sleep apnea)   Epilepsy (HCC)   1)Acute metabolic encephalopathy--resolved at this time, more awake, more talkative, more interactive, more oriented  - no acute focal neurologic deficits.  No significant hypercarbia on blood gas.  Urinalysis, TSH, ammonia, B12 and RPR unrevealing.  Could not perform MRI due to presence of pacemaker.  EEG did not show any epileptiform discharges. Wife does not report any recent medication changes and it is unlikely that he took any extra medications.  Patient had hypotension.  Likely related to volume depletion in the setting of antihypertensive medications.   Metoprolol, Lasix and Entresto have been discontinued.  He received IV fluid boluses.   -Cognitively appears back to baseline much improved -- official Neurology consult appreciated, may have had a seizure per neurologist, continue current anti-seizure regimen   2)Coronary artery disease--- asymptomatic,  Continue aspirin 81 mg daily, Lipitor 40 mg daily and c/n Metoprolol at 50 mg twice daily  3)Pneumonia--.  Patient noted to have small infiltrate at left base.  He  does appear to be mildly short of breath and reports having a cough.--Cannot rule out aspiration component, okay to completed Augmentin -last dose 04/21/2018.  -Continue supplemental  oxygen  4)Chronic systolic CHF.  EF 40-45%--appears euvolemic, stable, -Continue to hold Lasix,  Restarted Entresto at half dose on 04/22/2019   5)Insulin dependent diabetes.  A1c was 8.7 earlier this month.  Continue on sliding scale for now.  6)AKI on chronic kidney disease stage III.  Baseline creatinine of 1.6..   -Continue to hold Metformin,  renal ultrasound without obstructive uropathy -Nephrology consult appreciated  -Creatinine is down to 1.2 from 3.5  -Continue to hold Lasix, - Restarted half dose Entresto on 04/22/2019 - monitor renal function closely  7)Paroxysmal atrial fibrillation.  Rate controlled with metoprolol.  Anticoagulated with Coumadin.--- Continue to monitor INR  8)Epilepsy--  Continue on Keppra and Neurontin--dose adjusted for renal function  9)Obstructive sleep apnea.  Continue supplemental O2 at night.  Continue CPAP.  10)FEN--speech pathologist recommends regular solids and thin liquids -Speech pathology signed off  11)Elevated blood pressure noted--- improved on metoprolol 50 twice daily and  Entresto restarted on 04/21/2018 at half dose  may use IV labetalol when necessary  Every 4 hours for systolic blood pressure over 160 mmhg  Disposition--- medically ready for discharge, awaiting transfer to SNF when bed is available   DVT prophylaxis: Warfarin Code Status: Full code  Family Communication: Discussed with wife   Disposition Plan: -SNF rehab  Consultants:  -Nephrology  Procedures:     Antimicrobials:    Augmentin   Subjective: -Eating or drinking well, no fevers, no new concerns -Patiently awaiting transfer  to SNF rehab -Tolerated CPAP overnight  Objective: Vitals:   04/22/19 1352 04/22/19 2110 04/23/19 0540 04/23/19 0912  BP: 128/60 (!) 143/65 132/60     Pulse: 68 65 63   Resp: 18 16 20    Temp: (!) 97.3 F (36.3 C) 97.7 F (36.5 C) 97.8 F (36.6 C)   TempSrc: Other (Comment) Oral Oral   SpO2: 100% 98% 96% 97%  Weight:   129 kg   Height:        Intake/Output Summary (Last 24 hours) at 04/23/2019 1853 Last data filed at 04/23/2019 1400 Gross per 24 hour  Intake 240 ml  Output 800 ml  Net -560 ml   Filed Weights   04/20/19 0541 04/21/19 0500 04/23/19 0540  Weight: 125.3 kg 127.2 kg 129 kg    Examination:  General exam: No distress, obese, in NAD  Respiratory system: Clear to auscultation. Respiratory effort normal. Cardiovascular system: Irregular. No murmurs, rubs, gallops. Gastrointestinal system: Abdomen is nondistended, soft and nontender.  Normal bowel sounds heard. Central nervous system: Generalized weakness, no focal neurological deficits. Extremities: 1+ edema bilaterally Skin: No rashes, lesions or ulcers Psychiatry: More interactive, oriented x3  Data Reviewed:   CBC: Recent Labs  Lab 04/18/19 0857 04/23/19 0546  WBC 9.5 9.9  HGB 9.8* 10.4*  HCT 33.4* 34.8*  MCV 94.4 94.3  PLT 144* 209   Basic Metabolic Panel: Recent Labs  Lab 04/19/19 0513 04/20/19 0543 04/21/19 0554 04/22/19 0854 04/23/19 0546  NA 145 141 143 141 140  K 4.5 4.6 4.7 4.5 4.6  CL 107 106 107 103 103  CO2 28 27 28 29 30   GLUCOSE 142* 108* 151* 141* 212*  BUN 46* 34* 28* 22 24*  CREATININE 1.74* 1.41* 1.27* 1.23 1.25*  CALCIUM 8.6* 8.6* 9.1 9.2 8.9  PHOS 3.0 2.6 3.0 3.2 2.9   GFR: Estimated Creatinine Clearance: 63.8 mL/min (A) (by C-G formula based on SCr of 1.25 mg/dL (H)). Liver Function Tests: Recent Labs  Lab 04/19/19 0513 04/20/19 0543 04/21/19 0554 04/22/19 0854 04/23/19 0546  ALBUMIN 2.7* 2.5* 2.5* 2.8* 2.6*   No results for input(s): LIPASE, AMYLASE in the last 168 hours. No results for input(s): AMMONIA in the last 168 hours. Coagulation Profile: Recent Labs  Lab 04/19/19 0513 04/20/19 0543  04/21/19 0553 04/22/19 0854 04/23/19 0546  INR 2.2* 2.8* 3.3* 2.6* 2.2*   Cardiac Enzymes: No results for input(s): CKTOTAL, CKMB, CKMBINDEX, TROPONINI in the last 168 hours. BNP (last 3 results) No results for input(s): PROBNP in the last 8760 hours. HbA1C: No results for input(s): HGBA1C in the last 72 hours. CBG: Recent Labs  Lab 04/22/19 1100 04/22/19 1618 04/23/19 0721 04/23/19 1120 04/23/19 1646  GLUCAP 157* 174* 177* 173* 185*   Lipid Profile: No results for input(s): CHOL, HDL, LDLCALC, TRIG, CHOLHDL, LDLDIRECT in the last 72 hours. Thyroid Function Tests: No results for input(s): TSH, T4TOTAL, FREET4, T3FREE, THYROIDAB in the last 72 hours. Anemia Panel: No results for input(s): VITAMINB12, FOLATE, FERRITIN, TIBC, IRON, RETICCTPCT in the last 72 hours. Sepsis Labs: No results for input(s): PROCALCITON, LATICACIDVEN in the last 168 hours.  Recent Results (from the past 240 hour(s))  Respiratory Panel by RT PCR (Flu A&B, Covid) - Nasopharyngeal Swab     Status: None   Collection Time: 04/23/19 10:02 AM   Specimen: Nasopharyngeal Swab  Result Value Ref Range Status   SARS Coronavirus 2 by RT PCR NEGATIVE NEGATIVE Final    Comment: (NOTE) SARS-CoV-2 target nucleic acids  are NOT DETECTED. The SARS-CoV-2 RNA is generally detectable in upper respiratoy specimens during the acute phase of infection. The lowest concentration of SARS-CoV-2 viral copies this assay can detect is 131 copies/mL. A negative result does not preclude SARS-Cov-2 infection and should not be used as the sole basis for treatment or other patient management decisions. A negative result may occur with  improper specimen collection/handling, submission of specimen other than nasopharyngeal swab, presence of viral mutation(s) within the areas targeted by this assay, and inadequate number of viral copies (<131 copies/mL). A negative result must be combined with clinical observations, patient history,  and epidemiological information. The expected result is Negative. Fact Sheet for Patients:  PinkCheek.be Fact Sheet for Healthcare Providers:  GravelBags.it This test is not yet ap proved or cleared by the [place] FDA and  has been authorized for detection and/or diagnosis of SARS-CoV-2 by FDA under an Emergency Use Authorization (EUA). This EUA will remain  in effect (meaning this test can be used) for the duration of the COVID-19 declaration under Section 564(b)(1) of the Act, 21 U.S.C. section 360bbb-3(b)(1), unless the authorization is terminated or revoked sooner.    Influenza A by PCR NEGATIVE NEGATIVE Final   Influenza B by PCR NEGATIVE NEGATIVE Final    Comment: (NOTE) The Xpert Xpress SARS-CoV-2/FLU/RSV assay is intended as an aid in  the diagnosis of influenza from Nasopharyngeal swab specimens and  should not be used as a sole basis for treatment. Nasal washings and  aspirates are unacceptable for Xpert Xpress SARS-CoV-2/FLU/RSV  testing. Fact Sheet for Patients: PinkCheek.be Fact Sheet for Healthcare Providers: GravelBags.it This test is not yet approved or cleared by the [place] FDA and  has been authorized for detection and/or diagnosis of SARS-CoV-2 by  FDA under an Emergency Use Authorization (EUA). This EUA will remain  in effect (meaning this test can be used) for the duration of the  Covid-19 declaration under Section 564(b)(1) of the Act, 21  U.S.C. section 360bbb-3(b)(1), unless the authorization is  terminated or revoked. Performed at [facility], [address] [place], [place] [address]     Radiology Studies: No results found. Scheduled Meds: . aspirin EC  81 mg Oral Daily  . atorvastatin  40 mg Oral q1800  . Chlorhexidine Gluconate Cloth  6 each Topical Daily  . famotidine  20 mg Oral QHS  . finasteride  5 mg Oral QPM  .  FLUoxetine  20 mg Oral QPM  . gabapentin  100 mg Oral Daily  . gabapentin  200 mg Oral QHS  . guaiFENesin  15 mL Oral TID  . insulin aspart  0-9 Units Subcutaneous TID WC  . levETIRAcetam  750 mg Oral BID  . metoprolol tartrate  50 mg Oral BID  . nystatin   Topical BID  . pantoprazole  40 mg Oral Daily  . sacubitril-valsartan  1 tablet Oral BID  . sodium chloride flush  3 mL Intravenous Q12H  . sodium chloride flush  3 mL Intravenous Q12H  . tamsulosin  0.8 mg Oral QPM  . Warfarin - Pharmacist Dosing Inpatient   Does not apply q1800   Continuous Infusions: . sodium chloride      LOS: 11 days   Roxan Hockey, MD [facility]  If 7PM-7AM, please contact night-coverage [email or web address]  04/23/2019, 6:53 PM

## 2019-04-24 LAB — GLUCOSE, CAPILLARY
Glucose-Capillary: 188 mg/dL — ABNORMAL HIGH (ref 70–99)
Glucose-Capillary: 209 mg/dL — ABNORMAL HIGH (ref 70–99)
Glucose-Capillary: 163 mg/dL — ABNORMAL HIGH (ref 70–99)
Glucose-Capillary: 171 mg/dL — ABNORMAL HIGH (ref 70–99)

## 2019-04-24 LAB — RENAL FUNCTION PANEL
Albumin: 2.6 g/dL — ABNORMAL LOW (ref 3.5–5.0)
Anion gap: 6 (ref 5–15)
BUN: 26 mg/dL — ABNORMAL HIGH (ref 8–23)
CO2: 28 mmol/L (ref 22–32)
Calcium: 8.6 mg/dL — ABNORMAL LOW (ref 8.9–10.3)
Chloride: 105 mmol/L (ref 98–111)
Creatinine, Ser: 1.33 mg/dL — ABNORMAL HIGH (ref 0.61–1.24)
GFR calc Af Amer: 59 mL/min — ABNORMAL LOW (ref >60)
GFR calc non Af Amer: 51 mL/min — ABNORMAL LOW (ref >60)
Glucose, Bld: 197 mg/dL — ABNORMAL HIGH (ref 70–99)
Phosphorus: 3.7 mg/dL (ref 2.5–4.6)
Potassium: 4.4 mmol/L (ref 3.5–5.1)
Sodium: 139 mmol/L (ref 135–145)

## 2019-04-24 LAB — PROTIME-INR
INR: 1.8 — ABNORMAL HIGH (ref 0.8–1.2)
Prothrombin Time: 20.6 seconds — ABNORMAL HIGH (ref 11.4–15.2)

## 2019-04-24 MED ORDER — FUROSEMIDE 10 MG/ML IJ SOLN
40.0000 mg | Freq: Once | INTRAMUSCULAR | Status: DC
  Filled 2019-04-24: qty 4

## 2019-04-24 MED ORDER — WARFARIN SODIUM 2.5 MG PO TABS
2.5000 mg | ORAL_TABLET | Freq: Once | ORAL | Status: AC
  Administered 2019-04-24: 2.5 mg via ORAL
  Filled 2019-04-24: qty 1

## 2019-04-24 MED ORDER — FUROSEMIDE 10 MG/ML IJ SOLN
40.0000 mg | Freq: Every day | INTRAMUSCULAR | Status: DC
  Administered 2019-04-24: 40 mg via INTRAVENOUS
  Filled 2019-04-24: qty 4

## 2019-04-24 MED ORDER — FUROSEMIDE 40 MG PO TABS
40.0000 mg | ORAL_TABLET | Freq: Every day | ORAL | Status: DC
  Administered 2019-04-25: 40 mg via ORAL
  Filled 2019-04-24: qty 1

## 2019-04-24 NOTE — Progress Notes (Signed)
PROGRESS NOTE    Noah [name]  QMG:[id number] DOB: 08-31-1940 DOA: 04/10/2019 PCP: Estanislado Pandy, MD    Brief Narrative:  79 year old male with a history of diabetes, hypertension, obstructive sleep apnea, chronic kidney disease stage III, seizure disorder, admitted to the hospital with 1 day of confusion.  Basic work-up including CT head, blood cultures were found to be unrevealing.  He was confused.  Much improved now -Currently medically stable, awaiting transfer to SNF when bed is available   Assessment & Plan:   Principal Problem:   Acute encephalopathy Active Problems:   DM (diabetes mellitus), type 2 with complications (HCC)   Chronic systolic (congestive) heart failure (HCC)   CAD S/P percutaneous coronary angioplasty   Atrial fibrillation (HCC)   CKD (chronic kidney disease) stage 3, GFR 30-59 ml/min   OSA (obstructive sleep apnea)   Epilepsy (HCC)   1)Acute metabolic encephalopathy--resolved at this time, more awake, more talkative, more interactive, more oriented  - no acute focal neurologic deficits.  No significant hypercarbia on blood gas.  Urinalysis, TSH, ammonia, B12 and RPR unrevealing.  Could not perform MRI due to presence of pacemaker.  EEG did not show any epileptiform discharges. Wife does not report any recent medication changes and it is unlikely that he took any extra medications.  Patient had hypotension.  Likely related to volume depletion in the setting of antihypertensive medications.   Metoprolol, Lasix and Entresto have been discontinued.  He received IV fluid boluses.   -Cognitively appears back to baseline much improved -- official Neurology consult appreciated, may have had a seizure per neurologist, continue current anti-seizure regimen   2)Coronary artery disease--- asymptomatic,  Continue aspirin 81 mg daily, Lipitor 40 mg daily and c/n Metoprolol at 50 mg twice daily  3)Pneumonia--.  Patient noted to have small infiltrate at left base.  He  does appear to be mildly short of breath and reports having a cough.--Cannot rule out aspiration component, okay to completed Augmentin -last dose 04/21/2018.  -Continue supplemental  oxygen  4)Chronic systolic CHF.  EF 40-45%--appears euvolemic, stable, --Restart Lasix at 40 mg daily (PTA was on 80 mg qam and 40 mg q pm) Restarted Entresto at half dose on 04/22/2019   5)Insulin dependent diabetes.  A1c was 8.7 earlier this month.  Continue on sliding scale for now.  6)AKI on chronic kidney disease stage III--- Baseline creatinine of 1.6..   -Continue to hold Metformin,  renal ultrasound without obstructive uropathy -Nephrology consult appreciated  -Creatinine is down to 1.2 from 3.5  -Restart Lasix at 40 mg daily (PTA was on 80 mg qam and 40 mg q pm) - Restarted half dose Entresto on 04/22/2019 - monitor renal function closely  7)Paroxysmal atrial fibrillation.  Rate controlled with metoprolol.  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  Continue supplemental O2 at night.  Continue CPAP.  10)FEN--speech pathologist recommends regular solids and thin liquids -Speech pathology signed off  11)Elevated blood pressure noted--- improved on metoprolol 50 twice daily and  Entresto restarted on 04/21/2018 at half dose  may use IV labetalol when necessary  Every 4 hours for systolic blood pressure over 160 mmhg  Disposition--- medically ready for discharge, awaiting transfer to SNF when bed is available   DVT prophylaxis: Warfarin Code Status: Full code  Family Communication: Discussed with wife,  also Discussed with Pt's daughter--Ms Bernell List  [phone]---    Disposition Plan: -SNF rehab  Consultants:  -Nephrology  Procedures:     Antimicrobials:   Completed Augmentin    Subjective: -Eating or drinking well, no fevers, no new concerns -Patiently awaiting transfer to SNF rehab Needs to be encouraged to be compliant with CPAP overnight  Objective: Vitals:   04/23/19 2109 04/23/19 2148 04/24/19 0539 04/24/19 1417  BP: (!) 106/51 (!) 139/119 (!) 160/78 (!) 135/54  Pulse: 62 (!) 58 63 (!) 59  Resp: 20 20 20 19   Temp: 97.6 F (36.4 C) 98 F (36.7 C) 97.8 F (36.6 C) 98.4 F (36.9 C)  TempSrc: Oral Oral Oral   SpO2: 97% 100% 99% 94%  Weight:   127 kg   Height:        Intake/Output Summary (Last 24 hours) at 04/24/2019 1713 Last data filed at 04/24/2019 1700 Gross per 24 hour  Intake 480 ml  Output 475 ml  Net 5 ml   Filed Weights   04/21/19 0500 04/23/19 0540 04/24/19 0539  Weight: 127.2 kg 129 kg 127 kg    Examination:  General exam: No distress, morbidly obese, in NAD  Respiratory system: Clear to auscultation. Respiratory effort normal. Cardiovascular system: Irregular. No murmurs, rubs, gallops. Gastrointestinal system: Abdomen is soft and nontender.  Normal bowel sounds heard.  Significantly increased truncal adiposity Central nervous system: Generalized weakness, no focal neurological deficits. Extremities: 1+ edema bilaterally Skin: No rashes, lesions or ulcers Psychiatry: Affect is appropriate,cognitively back to baseline ,oriented x3  Data Reviewed:   CBC: Recent Labs  Lab 04/18/19 0857 04/23/19 0546  WBC 9.5 9.9  HGB 9.8* 10.4*  HCT 33.4* 34.8*  MCV 94.4 94.3  PLT 144* 209   Basic Metabolic Panel: Recent Labs  Lab 04/20/19 0543 04/21/19 0554 04/22/19 0854 04/23/19 0546 04/24/19 0546  NA 141 143 141 140 139  K 4.6 4.7 4.5 4.6 4.4  CL 106 107 103 103 105  CO2 27 28 29 30 28   GLUCOSE 108* 151* 141* 212* 197*  BUN 34* 28* 22 24* 26*  CREATININE 1.41* 1.27* 1.23 1.25* 1.33*  CALCIUM 8.6* 9.1 9.2 8.9 8.6*  PHOS 2.6 3.0 3.2 2.9 3.7   GFR: Estimated Creatinine Clearance: 59.4 mL/min (A) (by C-G formula based on  SCr of 1.33 mg/dL (H)). Liver Function Tests: Recent Labs  Lab 04/20/19 0543 04/21/19 0554 04/22/19 0854 04/23/19 0546 04/24/19 0546  ALBUMIN 2.5* 2.5* 2.8* 2.6* 2.6*   No results for input(s): LIPASE, AMYLASE in the last 168 hours. No results for input(s): AMMONIA in the last 168 hours. Coagulation Profile: Recent Labs  Lab 04/20/19 0543 04/21/19 0553 04/22/19 0854 04/23/19 0546 04/24/19 0546  INR 2.8* 3.3* 2.6* 2.2* 1.8*   Cardiac Enzymes: No results for input(s): CKTOTAL, CKMB, CKMBINDEX, TROPONINI in the last 168 hours. BNP (last 3 results) No results for input(s): PROBNP in the last 8760 hours. HbA1C: No results for input(s): HGBA1C in the last 72 hours. CBG: Recent Labs  Lab 04/23/19 1646 04/23/19 2146 04/24/19 0726 04/24/19 1132 04/24/19 1628  GLUCAP 185* 219* 188* 209* 163*   Lipid Profile: No results for input(s): CHOL, HDL, LDLCALC, TRIG, CHOLHDL, LDLDIRECT in the last 72 hours. Thyroid Function Tests: No results for input(s): TSH, T4TOTAL, FREET4, T3FREE, THYROIDAB in the last 72 hours. Anemia Panel: No results for input(s): VITAMINB12, FOLATE, FERRITIN, TIBC, IRON, RETICCTPCT in the last 72 hours. Sepsis Labs: No results for input(s): PROCALCITON, LATICACIDVEN in the  last 168 hours.  Recent Results (from the past 240 hour(s))  Respiratory Panel by RT PCR (Flu A&B, Covid) - Nasopharyngeal Swab     Status: None   Collection Time: 04/23/19 10:02 AM   Specimen: Nasopharyngeal Swab  Result Value Ref Range Status   SARS Coronavirus 2 by RT PCR NEGATIVE NEGATIVE Final    Comment: (NOTE) SARS-CoV-2 target nucleic acids are NOT DETECTED. The SARS-CoV-2 RNA is generally detectable in upper respiratoy specimens during the acute phase of infection. The lowest concentration of SARS-CoV-2 viral copies this assay can detect is 131 copies/mL. A negative result does not preclude SARS-Cov-2 infection and should not be used as the sole basis for treatment  or other patient management decisions. A negative result may occur with  improper specimen collection/handling, submission of specimen other than nasopharyngeal swab, presence of viral mutation(s) within the areas targeted by this assay, and inadequate number of viral copies (<131 copies/mL). A negative result must be combined with clinical observations, patient history, and epidemiological information. The expected result is Negative. Fact Sheet for Patients:  PinkCheek.be Fact Sheet for Healthcare Providers:  GravelBags.it This test is not yet ap proved or cleared by the [place] FDA and  has been authorized for detection and/or diagnosis of SARS-CoV-2 by FDA under an Emergency Use Authorization (EUA). This EUA will remain  in effect (meaning this test can be used) for the duration of the COVID-19 declaration under Section 564(b)(1) of the Act, 21 U.S.C. section 360bbb-3(b)(1), unless the authorization is terminated or revoked sooner.    Influenza A by PCR NEGATIVE NEGATIVE Final   Influenza B by PCR NEGATIVE NEGATIVE Final    Comment: (NOTE) The Xpert Xpress SARS-CoV-2/FLU/RSV assay is intended as an aid in  the diagnosis of influenza from Nasopharyngeal swab specimens and  should not be used as a sole basis for treatment. Nasal washings and  aspirates are unacceptable for Xpert Xpress SARS-CoV-2/FLU/RSV  testing. Fact Sheet for Patients: PinkCheek.be Fact Sheet for Healthcare Providers: GravelBags.it This test is not yet approved or cleared by the [place] FDA and  has been authorized for detection and/or diagnosis of SARS-CoV-2 by  FDA under an Emergency Use Authorization (EUA). This EUA will remain  in effect (meaning this test can be used) for the duration of the  Covid-19 declaration under Section 564(b)(1) of the Act, 21  U.S.C. section  360bbb-3(b)(1), unless the authorization is  terminated or revoked. Performed at [facility], [address], [place], [place] [address]     Radiology Studies: No results found. Scheduled Meds: . aspirin EC  81 mg Oral Daily  . atorvastatin  40 mg Oral q1800  . famotidine  20 mg Oral QHS  . finasteride  5 mg Oral QPM  . FLUoxetine  20 mg Oral QPM  . gabapentin  100 mg Oral Daily  . gabapentin  200 mg Oral QHS  . guaiFENesin  15 mL Oral TID  . insulin aspart  0-9 Units Subcutaneous TID WC  . levETIRAcetam  750 mg Oral BID  . metoprolol tartrate  50 mg Oral BID  . nystatin   Topical BID  . pantoprazole  40 mg Oral Daily  . sacubitril-valsartan  1 tablet Oral BID  . sodium chloride flush  3 mL Intravenous Q12H  . sodium chloride flush  3 mL Intravenous Q12H  . tamsulosin  0.8 mg Oral QPM  . Warfarin - Pharmacist Dosing Inpatient   Does not apply q1800   Continuous Infusions: .  sodium chloride      LOS: 12 days   Shon Hale, MD [facility]  If 7PM-7AM, please contact night-coverage [email or web address]  04/24/2019, 5:13 PM

## 2019-04-24 NOTE — Progress Notes (Signed)
ANTICOAGULATION CONSULT NOTE - Pharmacy Consult for Coumadin Indication: atrial fibrillation  Allergies  Allergen Reactions  . Codeine Other (See Comments)    constipation  . Erythromycin-Sulfisoxazole Other (See Comments)    Causes infection in throat and eyes    Patient Measurements: Height: 5\' 8"  (172.7 cm) Weight: 279 lb 15.8 oz (127 kg) IBW/kg (Calculated) : 68.4  Vital Signs: Temp: 97.8 F (36.6 C) (01/05 0539) Temp Source: Oral (01/05 0539) BP: 160/78 (01/05 0539) Pulse Rate: 63 (01/05 0539)  Labs: Recent Labs    04/22/19 0854 04/23/19 0546 04/24/19 0546  HGB  --  10.4*  --   HCT  --  34.8*  --   PLT  --  209  --   LABPROT 27.8* 24.2* 20.6*  INR 2.6* 2.2* 1.8*  CREATININE 1.23 1.25* 1.33*    Estimated Creatinine Clearance: 59.4 mL/min (A) (by C-G formula based on SCr of 1.33 mg/dL (H)).   Medical History: Past Medical History:  Diagnosis Date  . CAD S/P percutaneous coronary angioplasty    remote PCIs in 1990's- last CFX PCI 2008  . Chronic kidney disease, stage III (moderate)   . Diabetes mellitus with complication (HCC)    with hyperglycemia and diabetic autonomic poly neuropathy  . Gastro-esophageal reflux disease with esophagitis   . Hyperlipidemia   . Hypertension   . Ischemic cardiomyopathy 01/20/2018   St Jude ICD   . Morbid obesity (HCC)   . Obstructive sleep apnea    on CPAP  . Polyneuropathy   . Primary insomnia     Medications:  See electronic med rec  Assessment: 79 y.o. M presents with confusion. Pt on coumadin PTA for afib. Admission INR 2.3 (therapeutic). CBC ok on admission Home dose: 2.5mg  daily except for 5mg  on Tues/Thur/Sat  INR 2.1>> 2.6 >3.7>4.1>4.3>3.2>2.4>2.2>2.8 (decent jump)> 3.3 > 2.6>> 2.2>> 1.8, just slightly subtherapeutic   Goal of Therapy:  INR 2-3 Monitor platelets by anticoagulation protocol: Yes   Plan:  Warfarin 2.5 mg x 1 today Daily INR  Monitor for S/S of bleeding  70, BS ,  BCPS Clinical Pharmacist Pager [phone] 04/24/2019 11:26 AM

## 2019-04-24 NOTE — Progress Notes (Signed)
Physical Therapy Treatment Patient Details Name: Noah [name] MRN: [id number] DOB: 1940/04/26 Today's Date: 04/24/2019    History of Present Illness Noah [name] is a 79 y.o. male with medical history significant for CAD, ischemic cardiomyopathy, insulin-dependent diabetes mellitus, OSA, chronic 3 L/min supplemental oxygen requirement, chronic kidney disease stage III, and seizures, presenting to the emergency department with 1 day of confusion.  Patient was reportedly in his usual state yesterday which is alert and fully oriented, but has been confused today per report of his family.  The patient also acknowledges feeling confused, but denies any headache, change in vision or hearing, or new focal numbness or weakness.  There has not been any recent fall or trauma reported and the patient is not known to use alcohol or illicit substances.    PT Comments    Pt sitting in bed eager to participate with therapy.  Less assistance needed today with transfers and gait with less fatigue and ability to ambulate further.   Pt did require short rest breaks between transitional movements. Therex completed in seated position with cues for form, completing exercises more slowly and controlled with increased ROM.  Pt posititioned in chair at EOS with chair alarm set, call bell and phone in lap.   Follow Up Recommendations        Equipment Recommendations       Recommendations for Other Services       Precautions / Restrictions Precautions Precautions: Fall Restrictions Weight Bearing Restrictions: No    Mobility  Bed Mobility Overal bed mobility: Needs Assistance Bed Mobility: Supine to Sit     Supine to sit: Mod assist;HOB elevated     General bed mobility comments: requires use of bed rail and HOB elevated and frequent verbal/tactile cueing for proper hand placement  and sequencing  Transfers Overall transfer level: Needs assistance Equipment used: Rolling walker (2  wheeled) Transfers: Sit to/from Stand Sit to Stand: Min assist         General transfer comment: slow labored movement with rest breaks between transitioning movements  Ambulation/Gait Ambulation/Gait assistance: Min guard Gait Distance (Feet): 65 Feet(in room) Assistive device: Rolling walker (2 wheeled) Gait Pattern/deviations: Decreased step length - right;Decreased step length - left;Decreased stride length;Shuffle Gait velocity: slow   General Gait Details: patient ambulates with small shuffled steps with RW and assist, slow and labored movements   Stairs             Wheelchair Mobility    Modified Rankin (Stroke Patients Only)       Balance                                            Cognition Arousal/Alertness: Awake/alert Behavior During Therapy: WFL for tasks assessed/performed Overall Cognitive Status: Within Functional Limits for tasks assessed                                        Exercises General Exercises - Lower Extremity Ankle Circles/Pumps: AROM;Seated;Both;20 reps Long Arc Quad: AROM;Both;20 reps;Seated Hip ABduction/ADduction: Seated;AROM;Strengthening;Both;10 reps Toe Raises: AROM;Both;20 reps;Seated Heel Raises: AROM;Both;20 reps;Seated    General Comments        Pertinent Vitals/Pain Pain Assessment: No/denies pain    Home Living  Prior Function            PT Goals (current goals can now be found in the care plan section)      Frequency           PT Plan      Co-evaluation              AM-PAC PT "6 Clicks" Mobility   Outcome Measure  Help needed turning from your back to your side while in a flat bed without using bedrails?: A Little Help needed moving from lying on your back to sitting on the side of a flat bed without using bedrails?: A Little Help needed moving to and from a bed to a chair (including a wheelchair)?: A Lot Help needed  standing up from a chair using your arms (e.g., wheelchair or bedside chair)?: A Lot Help needed to walk in hospital room?: A Little Help needed climbing 3-5 steps with a railing? : A Lot 6 Click Score: 15    End of Session Equipment Utilized During Treatment: Oxygen;Gait belt Activity Tolerance: Patient tolerated treatment well;Patient limited by fatigue Patient left: in chair;with call bell/phone within reach Nurse Communication: Mobility status PT Visit Diagnosis: Unsteadiness on feet (R26.81);Other abnormalities of gait and mobility (R26.89);Muscle weakness (generalized) (M62.81)     Time: 1100-1130 PT Time Calculation (min) (ACUTE ONLY): 30 min  Charges:  $Gait Training: 8-22 mins $Therapeutic Exercise: 8-22 mins                     Teena Irani, PTA/CLT [place], Noah [name] 04/24/2019, 2:05 PM

## 2019-04-24 NOTE — Care Management Important Message (Signed)
Important Message  Patient Details  Name: Noah [name] MRN: [id number] Date of Birth: 1940-05-05   Medicare Important Message Given:  Yes(Tricia, RN will deliver letter to patient)     Corey Harold 04/24/2019, 4:06 PM

## 2019-04-25 DIAGNOSIS — G9341 Metabolic encephalopathy: Secondary | ICD-10-CM | POA: Diagnosis not present

## 2019-04-25 DIAGNOSIS — Z20822 Contact with and (suspected) exposure to covid-19: Secondary | ICD-10-CM | POA: Diagnosis not present

## 2019-04-25 DIAGNOSIS — N179 Acute kidney failure, unspecified: Secondary | ICD-10-CM | POA: Diagnosis not present

## 2019-04-25 DIAGNOSIS — E785 Hyperlipidemia, unspecified: Secondary | ICD-10-CM | POA: Diagnosis not present

## 2019-04-25 DIAGNOSIS — E559 Vitamin D deficiency, unspecified: Secondary | ICD-10-CM | POA: Diagnosis not present

## 2019-04-25 DIAGNOSIS — E1122 Type 2 diabetes mellitus with diabetic chronic kidney disease: Secondary | ICD-10-CM | POA: Diagnosis not present

## 2019-04-25 DIAGNOSIS — I5022 Chronic systolic (congestive) heart failure: Secondary | ICD-10-CM | POA: Diagnosis not present

## 2019-04-25 DIAGNOSIS — E1165 Type 2 diabetes mellitus with hyperglycemia: Secondary | ICD-10-CM | POA: Diagnosis not present

## 2019-04-25 DIAGNOSIS — J189 Pneumonia, unspecified organism: Secondary | ICD-10-CM | POA: Diagnosis not present

## 2019-04-25 DIAGNOSIS — I959 Hypotension, unspecified: Secondary | ICD-10-CM | POA: Diagnosis not present

## 2019-04-25 DIAGNOSIS — I509 Heart failure, unspecified: Secondary | ICD-10-CM | POA: Diagnosis not present

## 2019-04-25 DIAGNOSIS — R29898 Other symptoms and signs involving the musculoskeletal system: Secondary | ICD-10-CM | POA: Diagnosis not present

## 2019-04-25 DIAGNOSIS — G40909 Epilepsy, unspecified, not intractable, without status epilepticus: Secondary | ICD-10-CM | POA: Diagnosis not present

## 2019-04-25 DIAGNOSIS — I10 Essential (primary) hypertension: Secondary | ICD-10-CM | POA: Diagnosis not present

## 2019-04-25 DIAGNOSIS — G4733 Obstructive sleep apnea (adult) (pediatric): Secondary | ICD-10-CM | POA: Diagnosis not present

## 2019-04-25 DIAGNOSIS — N400 Enlarged prostate without lower urinary tract symptoms: Secondary | ICD-10-CM | POA: Diagnosis not present

## 2019-04-25 DIAGNOSIS — I480 Paroxysmal atrial fibrillation: Secondary | ICD-10-CM | POA: Diagnosis not present

## 2019-04-25 DIAGNOSIS — Z79899 Other long term (current) drug therapy: Secondary | ICD-10-CM | POA: Diagnosis not present

## 2019-04-25 DIAGNOSIS — I482 Chronic atrial fibrillation, unspecified: Secondary | ICD-10-CM | POA: Diagnosis not present

## 2019-04-25 DIAGNOSIS — R0902 Hypoxemia: Secondary | ICD-10-CM | POA: Diagnosis not present

## 2019-04-25 DIAGNOSIS — D649 Anemia, unspecified: Secondary | ICD-10-CM | POA: Diagnosis not present

## 2019-04-25 DIAGNOSIS — I2510 Atherosclerotic heart disease of native coronary artery without angina pectoris: Secondary | ICD-10-CM | POA: Diagnosis not present

## 2019-04-25 DIAGNOSIS — E782 Mixed hyperlipidemia: Secondary | ICD-10-CM | POA: Diagnosis not present

## 2019-04-25 DIAGNOSIS — R69 Illness, unspecified: Secondary | ICD-10-CM | POA: Diagnosis not present

## 2019-04-25 DIAGNOSIS — G9340 Encephalopathy, unspecified: Secondary | ICD-10-CM | POA: Diagnosis not present

## 2019-04-25 DIAGNOSIS — Z7401 Bed confinement status: Secondary | ICD-10-CM | POA: Diagnosis not present

## 2019-04-25 DIAGNOSIS — E1140 Type 2 diabetes mellitus with diabetic neuropathy, unspecified: Secondary | ICD-10-CM | POA: Diagnosis not present

## 2019-04-25 DIAGNOSIS — E039 Hypothyroidism, unspecified: Secondary | ICD-10-CM | POA: Diagnosis not present

## 2019-04-25 DIAGNOSIS — N189 Chronic kidney disease, unspecified: Secondary | ICD-10-CM | POA: Diagnosis not present

## 2019-04-25 DIAGNOSIS — E119 Type 2 diabetes mellitus without complications: Secondary | ICD-10-CM | POA: Diagnosis not present

## 2019-04-25 DIAGNOSIS — E109 Type 1 diabetes mellitus without complications: Secondary | ICD-10-CM | POA: Diagnosis not present

## 2019-04-25 DIAGNOSIS — I130 Hypertensive heart and chronic kidney disease with heart failure and stage 1 through stage 4 chronic kidney disease, or unspecified chronic kidney disease: Secondary | ICD-10-CM | POA: Diagnosis not present

## 2019-04-25 DIAGNOSIS — Z1152 Encounter for screening for COVID-19: Secondary | ICD-10-CM | POA: Diagnosis not present

## 2019-04-25 DIAGNOSIS — J9610 Chronic respiratory failure, unspecified whether with hypoxia or hypercapnia: Secondary | ICD-10-CM | POA: Diagnosis not present

## 2019-04-25 DIAGNOSIS — E7800 Pure hypercholesterolemia, unspecified: Secondary | ICD-10-CM | POA: Diagnosis not present

## 2019-04-25 DIAGNOSIS — D518 Other vitamin B12 deficiency anemias: Secondary | ICD-10-CM | POA: Diagnosis not present

## 2019-04-25 DIAGNOSIS — Z950 Presence of cardiac pacemaker: Secondary | ICD-10-CM | POA: Diagnosis not present

## 2019-04-25 DIAGNOSIS — I5020 Unspecified systolic (congestive) heart failure: Secondary | ICD-10-CM | POA: Diagnosis not present

## 2019-04-25 DIAGNOSIS — R410 Disorientation, unspecified: Secondary | ICD-10-CM | POA: Diagnosis not present

## 2019-04-25 LAB — GLUCOSE, CAPILLARY
Glucose-Capillary: 158 mg/dL — ABNORMAL HIGH (ref 70–99)
Glucose-Capillary: 188 mg/dL — ABNORMAL HIGH (ref 70–99)
Glucose-Capillary: 164 mg/dL — ABNORMAL HIGH (ref 70–99)
Glucose-Capillary: 181 mg/dL — ABNORMAL HIGH (ref 70–99)
Glucose-Capillary: 170 mg/dL — ABNORMAL HIGH (ref 70–99)

## 2019-04-25 LAB — PROTIME-INR
INR: 2 — ABNORMAL HIGH (ref 0.8–1.2)
Prothrombin Time: 22.4 seconds — ABNORMAL HIGH (ref 11.4–15.2)

## 2019-04-25 LAB — RENAL FUNCTION PANEL
Albumin: 2.6 g/dL — ABNORMAL LOW (ref 3.5–5.0)
Anion gap: 8 (ref 5–15)
BUN: 28 mg/dL — ABNORMAL HIGH (ref 8–23)
CO2: 29 mmol/L (ref 22–32)
Calcium: 8.8 mg/dL — ABNORMAL LOW (ref 8.9–10.3)
Chloride: 103 mmol/L (ref 98–111)
Creatinine, Ser: 1.49 mg/dL — ABNORMAL HIGH (ref 0.61–1.24)
GFR calc Af Amer: 51 mL/min — ABNORMAL LOW (ref >60)
GFR calc non Af Amer: 44 mL/min — ABNORMAL LOW (ref >60)
Glucose, Bld: 197 mg/dL — ABNORMAL HIGH (ref 70–99)
Phosphorus: 3.7 mg/dL (ref 2.5–4.6)
Potassium: 4.7 mmol/L (ref 3.5–5.1)
Sodium: 140 mmol/L (ref 135–145)

## 2019-04-25 MED ORDER — PANTOPRAZOLE SODIUM 40 MG PO TBEC: 40.0000 mg | DELAYED_RELEASE_TABLET | Freq: Every day | ORAL | 2 refills | Status: AC

## 2019-04-25 MED ORDER — INSULIN ASPART 100 UNIT/ML ~~LOC~~ SOLN: SUBCUTANEOUS | 3 refills | Status: DC

## 2019-04-25 MED ORDER — SACUBITRIL-VALSARTAN 24-26 MG PO TABS: 1.0000 | ORAL_TABLET | Freq: Two times a day (BID) | ORAL | 3 refills | Status: DC

## 2019-04-25 MED ORDER — GUAIFENESIN 100 MG/5ML PO SOLN: 15.0000 mL | Freq: Three times a day (TID) | ORAL | 0 refills | Status: AC

## 2019-04-25 MED ORDER — SENNOSIDES-DOCUSATE SODIUM 8.6-50 MG PO TABS: 1.0000 | ORAL_TABLET | Freq: Every day | ORAL | 3 refills | Status: DC

## 2019-04-25 MED ORDER — FUROSEMIDE 40 MG PO TABS: 40.0000 mg | ORAL_TABLET | Freq: Every day | ORAL | 2 refills | Status: AC

## 2019-04-25 MED ORDER — METOPROLOL SUCCINATE ER 50 MG PO TB24: 50.0000 mg | ORAL_TABLET | Freq: Every day | ORAL | 3 refills | Status: AC

## 2019-04-25 MED ORDER — GABAPENTIN 100 MG PO CAPS: 100.0000 mg | ORAL_CAPSULE | Freq: Two times a day (BID) | ORAL | 3 refills | Status: DC

## 2019-04-25 MED ORDER — ONDANSETRON HCL 4 MG PO TABS: 4.0000 mg | ORAL_TABLET | Freq: Four times a day (QID) | ORAL | 0 refills | Status: DC | PRN

## 2019-04-25 MED ORDER — ASPIRIN EC 81 MG PO TBEC: 81.0000 mg | DELAYED_RELEASE_TABLET | Freq: Every day | ORAL | 3 refills | Status: DC

## 2019-04-25 MED ORDER — ATORVASTATIN CALCIUM 40 MG PO TABS: 40.0000 mg | ORAL_TABLET | Freq: Every evening | ORAL | 2 refills | Status: DC

## 2019-04-25 NOTE — Progress Notes (Signed)
Nsg Discharge Note  Admit Date:  04/10/2019 Discharge date: 04/25/2019   Darvin Dials Jefcoat to be D/C'd to [facility] per MD order.  AVS completed.  Copy for chart, and copy for patient signed, and dated. Patient/caregiver able to verbalize understanding.  Discharge Medication: Allergies as of 04/25/2019      Reactions   Codeine Other (See Comments)   constipation   Erythromycin-sulfisoxazole Other (See Comments)   Causes infection in throat and eyes      Medication List    STOP taking these medications   Entresto 49-51 MG Generic drug: sacubitril-valsartan Replaced by: sacubitril-valsartan 24-26 MG   famotidine 20 MG tablet Commonly known as: PEPCID   metFORMIN 500 MG 24 hr tablet Commonly known as: GLUCOPHAGE-XR   omeprazole 20 MG capsule Commonly known as: PRILOSEC Replaced by: pantoprazole 40 MG tablet   simvastatin 80 MG tablet Commonly known as: ZOCOR Replaced by: atorvastatin 40 MG tablet     TAKE these medications   acetaminophen 650 MG CR tablet Commonly known as: TYLENOL Take 650-1,300 mg by mouth daily as needed for pain.   aspirin EC 81 MG tablet Take 1 tablet (81 mg total) by mouth daily with breakfast. What changed: when to take this   atorvastatin 40 MG tablet Commonly known as: LIPITOR Take 1 tablet (40 mg total) by mouth every evening. Replaces: simvastatin 80 MG tablet   cetirizine 10 MG tablet Commonly known as: ZYRTEC Take 10 mg by mouth daily.   finasteride 5 MG tablet Commonly known as: PROSCAR Take 5 mg by mouth every evening.   Fish Oil 1000 MG Caps Take 1,000 mg by mouth 2 (two) times daily.   FLUoxetine 20 MG capsule Commonly known as: PROZAC Take 20 mg by mouth every evening.   furosemide 40 MG tablet Commonly known as: LASIX Take 1 tablet (40 mg total) by mouth daily. Start taking on: April 26, 2019 What changed:   how much to take  when to take this  additional instructions   gabapentin 100 MG capsule Commonly  known as: NEURONTIN Take 1 capsule (100 mg total) by mouth 2 (two) times daily. What changed:   how much to take  when to take this  additional instructions   guaiFENesin 100 MG/5ML Soln Commonly known as: ROBITUSSIN Take 15 mLs (300 mg total) by mouth 3 (three) times daily for 5 days.   insulin aspart 100 UNIT/ML injection Commonly known as: NovoLOG insulin aspart (novoLOG) injection 0-9 Units 0-9 Units, Subcutaneous, 3 times daily with meals, CBG < 70: Implement Hypoglycemia Standing Orders and refer to Hypoglycemia Standing Orders sidebar report  CBG 70 - 120: 0 units CBG 121 - 150: 0 unit CBG 151 - 200: 2 units CBG 201 - 250: 3 units CBG 251 - 300: 5 units CBG 301 - 350: 7 units CBG 351 - 400: 9 units CBG > 400:   levETIRAcetam 750 MG tablet Commonly known as: KEPPRA Take 750 mg by mouth 2 (two) times daily.   metoprolol succinate 50 MG 24 hr tablet Commonly known as: Toprol XL Take 1 tablet (50 mg total) by mouth daily. Take with or immediately following a meal. What changed:   medication strength  how much to take  additional instructions   ondansetron 4 MG tablet Commonly known as: ZOFRAN Take 1 tablet (4 mg total) by mouth every 6 (six) hours as needed for nausea.   pantoprazole 40 MG tablet Commonly known as: PROTONIX Take 1 tablet (40 mg total)  by mouth daily. Start taking on: April 26, 2019 Replaces: omeprazole 20 MG capsule   sacubitril-valsartan 24-26 MG Commonly known as: ENTRESTO Take 1 tablet by mouth 2 (two) times daily. Replaces: Entresto 49-51 MG   senna-docusate 8.6-50 MG tablet Commonly known as: Senokot-S Take 1 tablet by mouth at bedtime.   [place] 100-33 UNT-MCG/ML Sopn Generic drug: Insulin Glargine-Lixisenatide Inject 30 Units into the skin every evening.   tamsulosin 0.4 MG Caps capsule Commonly known as: FLOMAX Take 0.8 mg by mouth every evening.   warfarin 5 MG tablet Commonly known as: COUMADIN Take as directed. If you are  unsure how to take this medication, talk to your nurse or doctor. Original instructions: Take 0.5-1 tablets (2.5-5 mg total) by mouth See admin instructions. Take 1/2 tablet (2.5mg ) daily except 1 tablet (5mg ) on Tuesdays, Thursdays and Saturdays       Discharge Assessment: Vitals:   04/25/19 0551 04/25/19 1357  BP: 131/69 (!) 143/67  Pulse: 61 62  Resp: 16 18  Temp: 98.4 F (36.9 C) 98 F (36.7 C)  SpO2: 99% 96%   Skin clean, dry and intact without evidence of skin break down, no evidence of skin tears noted. IV catheter discontinued intact. Site without signs and symptoms of complications - no redness or edema noted at insertion site, patient denies c/o pain - only slight tenderness at site.  Dressing with slight pressure applied.  D/c Instructions-Education: Discharge instructions given to facility with verbalized understanding. D/c education completed with patient/family including follow up instructions, medication list, d/c activities limitations if indicated, with other d/c instructions as indicated by MD - patient able to verbalize understanding, all questions fully answered. Patient instructed to return to ED, call 911, or call MD for any changes in condition.  Patient escorted via [place], and D/C home via private auto.  Zachery Conch, RN 04/25/2019 5:05 PM

## 2019-04-25 NOTE — Plan of Care (Signed)

## 2019-04-25 NOTE — Discharge Summary (Signed)
Noah [name], is a 79 y.o. male  DOB [birth year]  MRN [id number].  Admission date:  04/10/2019  Admitting Physician  Erick Blinks, MD  Discharge Date:  04/25/2019   Primary MD  Estanislado Pandy, MD  Recommendations for primary care physician for things to follow:   -1)Avoid ibuprofen/Advil/Aleve/Motrin/Goody Powders/Naproxen/BC powders/Meloxicam/Diclofenac/Indomethacin and other Nonsteroidal anti-inflammatory medications as these will make you more likely to bleed and can cause stomach ulcers, can also cause Kidney problems.   2)insulin aspart (novoLOG) injection 0-9 Units 0-9 Units, Subcutaneous, 3 times daily with meals, CBG < 70: Implement Hypoglycemia Standing Orders and refer to Hypoglycemia Standing Orders sidebar report  CBG 70 - 120: 0 units CBG 121 - 150: 0 unit CBG 151 - 200: 2 units CBG 201 - 250: 3 units CBG 251 - 300: 5 units CBG 301 - 350: 7 units CBG 351 - 400: 9 units CBG > 400:  3)Repeat BMP, PT/INR and CBC every Monday starting April 30, 2019  Admission Diagnosis  Delirium [R41.0] Acute encephalopathy [G93.40] Altered behavior [R46.89]   Discharge Diagnosis  Delirium [R41.0] Acute encephalopathy [G93.40] Altered behavior [R46.89]    Principal Problem:   Acute encephalopathy Active Problems:   DM (diabetes mellitus), type 2 with complications (HCC)   Chronic systolic (congestive) heart failure (HCC)   CAD S/P percutaneous coronary angioplasty   Atrial fibrillation (HCC)   CKD (chronic kidney disease) stage 3, GFR 30-59 ml/min   OSA (obstructive sleep apnea)   Epilepsy (HCC)      Past Medical History:  Diagnosis Date  . CAD S/P percutaneous coronary angioplasty    remote PCIs in 1990's- last CFX PCI 2008  . Chronic kidney disease, stage III (moderate)   . Diabetes mellitus with complication (HCC)    with hyperglycemia and diabetic autonomic poly neuropathy  .  Gastro-esophageal reflux disease with esophagitis   . Hyperlipidemia   . Hypertension   . Ischemic cardiomyopathy 01/20/2018   St Jude ICD   . Morbid obesity (HCC)   . Obstructive sleep apnea    on CPAP  . Polyneuropathy   . Primary insomnia     Past Surgical History:  Procedure Laterality Date  . BIV ICD INSERTION CRT-D N/A 01/20/2018   Procedure: BIV ICD INSERTION CRT-D;  Surgeon: Marinus Maw, MD;  Location: [facility] INVASIVE CV LAB;  Service: Cardiovascular;  Laterality: N/A;  . BIV PACEMAKER INSERTION CRT-P  01/20/2018   St Jude- Dr Ladona Ridgel  . CARDIAC CATHETERIZATION     multiple angioplasty X 8, 4 stents      HPI  from the history and physical done on the day of admission:   Chief Complaint: Confusion   HPI: Noah [name] is a 79 y.o. male with medical history significant for CAD, ischemic cardiomyopathy, insulin-dependent diabetes mellitus, OSA, chronic 3 L/min supplemental oxygen requirement, chronic kidney disease stage III, and seizures, presenting to the emergency department with 1 day of confusion.  Patient was reportedly in his usual state yesterday which is alert and fully  oriented, but has been confused today per report of his family.  The patient also acknowledges feeling confused, but denies any headache, change in vision or hearing, or new focal numbness or weakness.  There has not been any recent fall or trauma reported and the patient is not known to use alcohol or illicit substances.  ED Course: Upon arrival to the ED, patient is found to be afebrile, saturating 99% on 3 L/min of supplemental oxygen, and with stable blood pressure.  EKG features sinus rhythm with chronic LBBB.  Noncontrast head CT is negative for acute intracranial abnormality.  Chest x-ray is negative for acute cardiopulmonary disease.  Chemistry panel is notable for creatinine 1.60, similar to priors.  CBC with mild normocytic anemia.  INR is therapeutic at 2.3.  Lactic acid reassuringly normal.   COVID-19 PCR screening test has not yet resulted.  Urinalysis ordered but not yet resulted.  Hospitalist asked to admit.  Review of Systems:  All other systems reviewed and apart from HPI, are negative    Hospital Course:   Brief Narrative:  79 year old male with a history of diabetes, hypertension, obstructive sleep apnea, chronic kidney disease stage III, seizure disorder, admitted to the hospital with 1 day of confusion.  Basic work-up including CT head, blood cultures were found to be unrevealing.  He was confused.  Much improved now, cognitively back to baseline as per wife and daughter -Currently medically stable,  transfer to SNF when bed is available   Assessment & Plan:   Principal Problem:   Acute encephalopathy Active Problems:   DM (diabetes mellitus), type 2 with complications (HCC)   Chronic systolic (congestive) heart failure (HCC)   CAD S/P percutaneous coronary angioplasty   Atrial fibrillation (HCC)   CKD (chronic kidney disease) stage 3, GFR 30-59 ml/min   OSA (obstructive sleep apnea)   Epilepsy (HCC)   1)Acute metabolic encephalopathy--resolved at this time,cognitively back to baseline as per wife and daughter - no acute focal neurologic deficits.  No significant hypercarbia on blood gas.  Urinalysis, TSH, ammonia, B12 and RPR unrevealing.  Could not perform MRI due to presence of pacemaker.  EEG did not show any epileptiform discharges. Wife does not report any recent medication changes and it is unlikely that he took any extra medications.  Patient had hypotension.  Likely related to volume depletion in the setting of antihypertensive medications.   Metoprolol, Lasix and Entresto have been will initially discontinued.  He received IV fluid boluses.   -Cognitively appears back to baseline ... much improved -- official Neurology consult appreciated, may have had a seizure per neurologist, continue current anti-seizure regimen   2)Coronary artery disease---  asymptomatic,  Continue aspirin 81 mg daily, Lipitor 40 mg daily and c/n Metoprolol   3)Pneumonia--.  Patient noted to have small infiltrate at left base.  He does appear to be mildly short of breath and reports having a cough.--Cannot rule out aspiration component, okay to completed Augmentin -last dose 04/21/2018.  -Continue supplemental  oxygen  4)Chronic systolic CHF.  EF 40-45%--appears euvolemic, stable, --continue Lasix at 40 mg daily (PTA was on 80 mg qam and 40 mg q pm) Restarted Entresto at half dose on 04/22/2019 (rather than full dose due to kidney concerns) -Check weekly BMP q. Mondays--anticipate creatinine will trend back up with restart of Lasix and Entresto (prior baseline creatinine was 1.6-1.8  5)Insulin dependent diabetes.  A1c was 8.7 earlier this month. -Restart home insulin regimen along with sliding scale for now. -Stop Metformin  due to renal impairment  6)AKI on chronic kidney disease stage III--- Baseline creatinine of 1.6 to 1.8 Discontinue Metformin,  renal ultrasound without obstructive uropathy -Nephrology consult appreciated  -Creatinine is  1.4  from a peak of 3.5  -Restartws Lasix at 40 mg daily (PTA was on 80 mg qam and 40 mg q pm) - Restarted half dose Entresto on 04/22/2019 (avoid full dose for now due to renal concerns) --Check weekly BMP q. Mondays--anticipate creatinine will trend back up with restart of Lasix and Entresto (prior baseline creatinine was 1.6-1.8  7)Paroxysmal atrial fibrillation.  Rate controlled with metoprolol.  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  Continue supplemental O2 at night.  Continue CPAP.  10)FEN--speech pathologist recommends regular solids and thin liquids -Speech pathology signed off  11)Elevated blood pressure noted--- improved on  metoprolol and  Entresto restarted on 04/21/2018 at half dose  Disposition--- medically ready for discharge, awaiting transfer to SNF when bed is available   DVT prophylaxis: Warfarin Code Status: Full code  Family Communication: Discussed with wife, also Discussed with Pt's daughter--Ms Bernell List  [phone]---    Disposition Plan: -SNF rehab-   Consultants:  -Nephrology  Procedures:     Antimicrobials:   Completed Augmentin   Discharge Condition: Stable,  Follow UP--- PCP within a week of discharge from rehab  Diet and Activity recommendation:  As advised  Discharge Instructions   Discharge Instructions    Call MD for:  difficulty breathing, headache or visual disturbances   Complete by: As directed    Call MD for:  persistant dizziness or light-headedness   Complete by: As directed    Call MD for:  persistant nausea and vomiting   Complete by: As directed    Call MD for:  severe uncontrolled pain   Complete by: As directed    Call MD for:  temperature >100.4   Complete by: As directed    Diet - low sodium heart healthy   Complete by: As directed    Diet Carb Modified   Complete by: As directed    Discharge instructions   Complete by: As directed    -1)Avoid ibuprofen/Advil/Aleve/Motrin/Goody Powders/Naproxen/BC powders/Meloxicam/Diclofenac/Indomethacin and other Nonsteroidal anti-inflammatory medications as these will make you more likely to bleed and can cause stomach ulcers, can also cause Kidney problems.   2)insulin aspart (novoLOG) injection 0-9 Units 0-9 Units, Subcutaneous, 3 times daily with meals, CBG < 70: Implement Hypoglycemia Standing Orders and refer to Hypoglycemia Standing Orders sidebar report  CBG 70 - 120: 0 units CBG 121 - 150: 0 unit CBG 151 - 200: 2 units CBG 201 - 250: 3 units CBG 251 - 300: 5 units CBG 301 - 350: 7 units CBG 351 - 400: 9 units CBG > 400:  3)Repeat BMP, PT/INR and CBC every Monday starting April 30, 2019    Increase activity slowly   Complete by: As directed        Discharge Medications     Allergies as of 04/25/2019      Reactions   Codeine Other (See Comments)   constipation   Erythromycin-sulfisoxazole Other (See Comments)   Causes infection in throat and eyes      Medication List    STOP taking these medications   Entresto 49-51 MG Generic drug: sacubitril-valsartan Replaced by: sacubitril-valsartan 24-26 MG   famotidine 20 MG  tablet Commonly known as: PEPCID   metFORMIN 500 MG 24 hr tablet Commonly known as: GLUCOPHAGE-XR   omeprazole 20 MG capsule Commonly known as: PRILOSEC Replaced by: pantoprazole 40 MG tablet   simvastatin 80 MG tablet Commonly known as: ZOCOR Replaced by: atorvastatin 40 MG tablet     TAKE these medications   acetaminophen 650 MG CR tablet Commonly known as: TYLENOL Take 650-1,300 mg by mouth daily as needed for pain.   aspirin EC 81 MG tablet Take 1 tablet (81 mg total) by mouth daily with breakfast. What changed: when to take this   atorvastatin 40 MG tablet Commonly known as: LIPITOR Take 1 tablet (40 mg total) by mouth every evening. Replaces: simvastatin 80 MG tablet   cetirizine 10 MG tablet Commonly known as: ZYRTEC Take 10 mg by mouth daily.   finasteride 5 MG tablet Commonly known as: PROSCAR Take 5 mg by mouth every evening.   Fish Oil 1000 MG Caps Take 1,000 mg by mouth 2 (two) times daily.   FLUoxetine 20 MG capsule Commonly known as: PROZAC Take 20 mg by mouth every evening.   furosemide 40 MG tablet Commonly known as: LASIX Take 1 tablet (40 mg total) by mouth daily. Start taking on: April 26, 2019 What changed:   how much to take  when to take this  additional instructions   gabapentin 100 MG capsule Commonly known as: NEURONTIN Take 1 capsule (100 mg total) by mouth 2 (two) times daily. What changed:   how much to take  when to take this  additional instructions   guaiFENesin 100 MG/5ML  Soln Commonly known as: ROBITUSSIN Take 15 mLs (300 mg total) by mouth 3 (three) times daily for 5 days.   insulin aspart 100 UNIT/ML injection Commonly known as: NovoLOG insulin aspart (novoLOG) injection 0-9 Units 0-9 Units, Subcutaneous, 3 times daily with meals, CBG < 70: Implement Hypoglycemia Standing Orders and refer to Hypoglycemia Standing Orders sidebar report  CBG 70 - 120: 0 units CBG 121 - 150: 0 unit CBG 151 - 200: 2 units CBG 201 - 250: 3 units CBG 251 - 300: 5 units CBG 301 - 350: 7 units CBG 351 - 400: 9 units CBG > 400:   levETIRAcetam 750 MG tablet Commonly known as: KEPPRA Take 750 mg by mouth 2 (two) times daily.   metoprolol succinate 50 MG 24 hr tablet Commonly known as: Toprol XL Take 1 tablet (50 mg total) by mouth daily. Take with or immediately following a meal. What changed:   medication strength  how much to take  additional instructions   ondansetron 4 MG tablet Commonly known as: ZOFRAN Take 1 tablet (4 mg total) by mouth every 6 (six) hours as needed for nausea.   pantoprazole 40 MG tablet Commonly known as: PROTONIX Take 1 tablet (40 mg total) by mouth daily. Start taking on: April 26, 2019 Replaces: omeprazole 20 MG capsule   sacubitril-valsartan 24-26 MG Commonly known as: ENTRESTO Take 1 tablet by mouth 2 (two) times daily. Replaces: Entresto 49-51 MG   senna-docusate 8.6-50 MG tablet Commonly known as: Senokot-S Take 1 tablet by mouth at bedtime.   Soliqua 100-33 UNT-MCG/ML Sopn Generic drug: Insulin Glargine-Lixisenatide Inject 30 Units into the skin every evening.   tamsulosin 0.4 MG Caps capsule Commonly known as: FLOMAX Take 0.8 mg by mouth every evening.   warfarin 5 MG tablet Commonly known as: COUMADIN Take as directed. If you are unsure how to take this medication, talk  to your nurse or doctor. Original instructions: Take 0.5-1 tablets (2.5-5 mg total) by mouth See admin instructions. Take 1/2 tablet (2.5mg ) daily  except 1 tablet (5mg ) on Tuesdays, Thursdays and Saturdays       Major procedures and Radiology Reports - PLEASE review detailed and final reports for all details, in brief -    EEG  Result Date: 04/12/2019 Charlsie Quest, MD     04/12/2019 11:20 AM Patient Name: AVRIEL BOLIVAR MRN: [id number] Epilepsy Attending: Charlsie Quest Referring Physician/Provider: Dr Erick Blinks Date: 04/12/2019 Duration: 22.54 minutes Patient history: 79 year old old male with history of epilepsy now presented with altered mental status.  EEG to evaluate for seizures. Level of alertness: Awake AEDs during EEG study: Keppra, gabapentin Technical aspects: This EEG study was done with scalp electrodes positioned according to the 10-20 International system of electrode placement. Electrical activity was acquired at a sampling rate of 500Hz  and reviewed with a high frequency filter of 70Hz  and a low frequency filter of 1Hz . EEG data were recorded continuously and digitally stored. Description: During awake state, no clear posterior dominant rhythm was seen.  EEG showed continuous generalized 3 to 6 Hz theta-delta slowing. Hyperventilation and photic stimulation were not performed. Abnormality -Continuous slow, generalized IMPRESSION: This study is suggestive of mild to moderate diffuse encephalopathy, nonspecific to etiology.  No seizures or epileptiform discharges were seen throughout the recording. Charlsie Quest   CT Head  Result Date: 04/10/2019 CLINICAL DATA:  Encephalopathy. History of intracranial hemorrhage. EXAM: CT HEAD WITHOUT CONTRAST TECHNIQUE: Contiguous axial images were obtained from the base of the skull through the vertex without intravenous contrast. COMPARISON:  Head CT 10/24/2018 FINDINGS: Brain: There is no mass, hemorrhage or extra-axial collection. The size and configuration of the ventricles and extra-axial CSF spaces are normal. There is hypoattenuation of the white matter, most commonly  indicating chronic small vessel disease. Vascular: Atherosclerotic calcification of the vertebral and internal carotid arteries at the skull base. No abnormal hyperdensity of the major intracranial arteries or dural venous sinuses. Skull: The visualized skull base, calvarium and extracranial soft tissues are normal. Sinuses/Orbits: No fluid levels or advanced mucosal thickening of the visualized paranasal sinuses. No mastoid or middle ear effusion. The orbits are normal. 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 Electronically Signed   By: Deatra Robinson M.D.   On: 04/10/2019 20:16   US RENAL  Result Date: 04/16/2019 CLINICAL DATA:  Acute kidney injury. EXAM: RENAL / URINARY TRACT ULTRASOUND COMPLETE COMPARISON:  None. FINDINGS: Right Kidney: Renal measurements: 11.0 x 8.4 x 7.9 cm = volume: 379 mL . Echogenicity within normal limits. No mass or hydronephrosis visualized. Left Kidney: Renal measurements: 12.2 x 7.9 x 8.6 cm = volume: 433 mL. Echogenicity within normal limits. No mass or hydronephrosis visualized. Bladder: Bilateral ureteral jets visible. Other: None. IMPRESSION: Bilateral renal enlargement by overall measurements. Large amount of fat in the renal hilar regions, probably artificially increasing the actual renal size. There is no hydronephrosis. There may be some renal enlargement component due to acute nephritis or diabetic glomerular nephropathy. Electronically Signed   By: Paulina Fusi M.D.   On: 04/16/2019 10:36   DG CHEST PORT 1 VIEW  Result Date: 04/12/2019 CLINICAL DATA:  Shortness of breath and weakness. EXAM: PORTABLE CHEST 1 VIEW COMPARISON:  04/10/2019 FINDINGS: 2832 hours. The cardio pericardial silhouette is enlarged. There is pulmonary vascular congestion without overt pulmonary edema. Patchy retrocardiac airspace disease noted with tiny left pleural effusion.  Old posterior right rib fractures noted. Left permanent pacemaker remains in  place. Telemetry leads overlie the chest. IMPRESSION: Cardiomegaly with left base atelectasis or infiltrate and small left pleural effusion. Electronically Signed   By: Kennith Center M.D.   On: 04/12/2019 20:44   XR Chest Single View  Result Date: 04/10/2019 CLINICAL DATA:  Altered mental status. 3 L home O2 EXAM: PORTABLE CHEST 1 VIEW COMPARISON:  03/28/2019 FINDINGS: Cardiomediastinal contours are stable. Lung volumes remain low. There is a multi lead pacer defibrillator with power pack over left chest as before. Azygos fissure again noted. Lungs are clear. No acute bone finding. IMPRESSION: Stable exam. No active cardiopulmonary disease. Electronically Signed   By: Donzetta Kohut M.D.   On: 04/10/2019 20:14   DG Chest Port 1 View  Result Date: 03/28/2019 CLINICAL DATA:  Shortness of breath, history coronary artery disease post angioplasty, ischemic cardiomyopathy, diabetes mellitus, hypertension EXAM: PORTABLE CHEST 1 VIEW COMPARISON:  Portable exam 1655 hours compared to 02/16/2019 FINDINGS: LEFT subclavian ICD with leads projecting over RIGHT atrium, RIGHT ventricle and coronary sinus. Enlargement of cardiac silhouette with pulmonary vascular congestion. Azygos fissure noted. Mediastinal contour stable. Minimal bibasilar atelectasis. Lungs otherwise clear. No pleural effusion or pneumothorax. IMPRESSION: Enlargement of cardiac silhouette with vascular congestion post AICD. Minimal bibasilar atelectasis. Electronically Signed   By: Ulyses Southward M.D.   On: 03/28/2019 17:02    Micro Results    Recent Results (from the past 240 hour(s))  Respiratory Panel by RT PCR (Flu A&B, Covid) - Nasopharyngeal Swab     Status: None   Collection Time: 04/23/19 10:02 AM   Specimen: Nasopharyngeal Swab  Result Value Ref Range Status   SARS Coronavirus 2 by RT PCR NEGATIVE NEGATIVE Final    Comment: (NOTE) SARS-CoV-2 target nucleic acids are NOT DETECTED. The SARS-CoV-2 RNA is generally detectable in upper  respiratoy specimens during the acute phase of infection. The lowest concentration of SARS-CoV-2 viral copies this assay can detect is 131 copies/mL. A negative result does not preclude SARS-Cov-2 infection and should not be used as the sole basis for treatment or other patient management decisions. A negative result may occur with  improper specimen collection/handling, submission of specimen other than nasopharyngeal swab, presence of viral mutation(s) within the areas targeted by this assay, and inadequate number of viral copies (<131 copies/mL). A negative result must be combined with clinical observations, patient history, and epidemiological information. The expected result is Negative. Fact Sheet for Patients:  https://www.moore.com/ Fact Sheet for Healthcare Providers:  https://www.young.biz/ This test is not yet ap proved or cleared by the [place] FDA and  has been authorized for detection and/or diagnosis of SARS-CoV-2 by FDA under an Emergency Use Authorization (EUA). This EUA will remain  in effect (meaning this test can be used) for the duration of the COVID-19 declaration under Section 564(b)(1) of the Act, 21 U.S.C. section 360bbb-3(b)(1), unless the authorization is terminated or revoked sooner.    Influenza A by PCR NEGATIVE NEGATIVE Final   Influenza B by PCR NEGATIVE NEGATIVE Final    Comment: (NOTE) The Xpert Xpress SARS-CoV-2/FLU/RSV assay is intended as an aid in  the diagnosis of influenza from Nasopharyngeal swab specimens and  should not be used as a sole basis for treatment. Nasal washings and  aspirates are unacceptable for Xpert Xpress SARS-CoV-2/FLU/RSV  testing. Fact Sheet for Patients: https://www.moore.com/ Fact Sheet for Healthcare Providers: https://www.young.biz/ This test is not yet approved or cleared by the [place] and  has been authorized for  detection and/or diagnosis of SARS-CoV-2 by  FDA under an Emergency Use Authorization (EUA). This EUA will remain  in effect (meaning this test can be used) for the duration of the  Covid-19 declaration under Section 564(b)(1) of the Act, 21  U.S.C. section 360bbb-3(b)(1), unless the authorization is  terminated or revoked. Performed at [facility], [address], [address] [place], [place] [address]     Today   [place] today has no new complaints No fever  Or chills   No Nausea, Vomiting or Diarrhea -Voiding okay, generalized weakness persists        Patient has been seen and examined prior to discharge   Objective   Blood pressure 131/69, pulse 61, temperature 98.4 F (36.9 C), resp. rate 16, height 5\' 8"  (1.727 m), weight 128.1 kg, SpO2 99 %.   Intake/Output Summary (Last 24 hours) at 04/25/2019 1033 Last data filed at 04/25/2019 0900 Gross per 24 hour  Intake 720 ml  Output 1675 ml  Net -955 ml    Exam General exam: No distress, morbidly obese, in NAD  Respiratory system: Clear to auscultation. Respiratory effort normal. Cardiovascular system: Irregular. No murmurs, rubs, gallops. Gastrointestinal system: Abdomen is soft and nontender.  Normal bowel sounds heard.  Significantly increased truncal adiposity Central nervous system: Generalized weakness, no focal neurological deficits. Extremities: 1+ edema bilaterally Skin: No rashes, lesions or ulcers Psychiatry: Affect is appropriate,cognitively back to baseline ,oriented x3   Data Review   CBC w Diff:  Lab Results  Component Value Date   WBC 9.9 04/23/2019   HGB 10.4 (L) 04/23/2019   HCT 34.8 (L) 04/23/2019   PLT 209 04/23/2019   LYMPHOPCT 13 03/28/2019   MONOPCT 7 03/28/2019   EOSPCT 2 03/28/2019   BASOPCT 1 03/28/2019    CMP:  Lab Results  Component Value Date   NA 140 04/25/2019   K 4.7 04/25/2019   CL 103 04/25/2019   CO2 29 04/25/2019   BUN 28 (H) 04/25/2019   CREATININE 1.49 (H)  04/25/2019   PROT 6.2 (L) 04/11/2019   ALBUMIN 2.6 (L) 04/25/2019   BILITOT 0.8 04/11/2019   ALKPHOS 61 04/11/2019   AST 14 (L) 04/11/2019   ALT 13 04/11/2019  .   Total Discharge time is about 33 minutes  Roxan Hockey M.D on 04/25/2019 at 10:33 AM  Go to [email or web address] -  for contact info  [facility] - Office  [phone]

## 2019-04-25 NOTE — TOC Transition Note (Signed)
Transition of Care [facility]) - CM/SW Discharge Note   Patient Details  Name: Noah [name] MRN: [id number] Date of Birth: [birth year]  Transition of Care [facility]) CM/SW Contact:  Breckyn Troyer Sherryle Lis, LCSW Phone Number: 04/25/2019, 4:45 PM   Clinical Narrative:   Patient discharging to [facility] for short term rehab today via [facility].   Patient received call from [facility] regarding insurance authorization. Authorization was approved today. Patients wife, Noah [name] made aware of transfer by CSW. Noah [name] is agreeable and satisfied.   No further needs at this time Kaelynn Igo Tomma Rakers Transitions of Care  Clinical Social Worker  Ph: [phone]    Final next level of care: Skilled Nursing Facility Barriers to Discharge: No Barriers Identified   Patient Goals and CMS Choice Patient states their goals for this hospitalization and ongoing recovery are:: to discharge to SNF and then to return home CMS Medicare.gov Compare Post Acute Care list provided to:: Patient Represenative (must comment) Choice offered to / list presented to : Spouse  Discharge Placement              Patient chooses bed at: [facility] Patient to be transferred to facility by: [facility] Name of family member notified: Noah [name] [facility]: [phone] Patient and family notified of of transfer: 04/25/19  Discharge Plan and Services                                     Social Determinants of Health (SDOH) Interventions     Readmission Risk Interventions Readmission Risk Prevention Plan 04/25/2019 04/17/2019  Transportation Screening Complete Complete  PCP or Specialist Appt within 3-5 Days - Not Complete  HRI or Home Care Consult - Complete  Social Work Consult for Recovery Care Planning/Counseling - Complete  Palliative Care Screening - Not Complete  Medication Review [employer]) Complete Complete  PCP or Specialist appointment within 3-5 days of discharge Not Complete -  PCP/Specialist Appt Not  Complete comments patient discharging to SNF -  HRI or Home Care Consult Not Complete -  SW Recovery Care/Counseling Consult Complete -  Palliative Care Screening Not Applicable -  Skilled Nursing Facility Complete -  Some recent data might be hidden

## 2019-04-25 NOTE — Discharge Instructions (Signed)
-  1)Avoid ibuprofen/Advil/Aleve/Motrin/Goody Powders/Naproxen/BC powders/Meloxicam/Diclofenac/Indomethacin and other Nonsteroidal anti-inflammatory medications as these will make you more likely to bleed and can cause stomach ulcers, can also cause Kidney problems.   2)insulin aspart (novoLOG) injection 0-9 Units 0-9 Units, Subcutaneous, 3 times daily with meals, CBG < 70: Implement Hypoglycemia Standing Orders and refer to Hypoglycemia Standing Orders sidebar report  CBG 70 - 120: 0 units CBG 121 - 150: 0 unit CBG 151 - 200: 2 units CBG 201 - 250: 3 units CBG 251 - 300: 5 units CBG 301 - 350: 7 units CBG 351 - 400: 9 units CBG > 400:  3) repeat BMP, PT/INR and CBC every Monday starting April 30, 2019

## 2019-04-25 NOTE — Progress Notes (Signed)
CSW in contact with Admission Coordinator who explains that this person has ben approved for a 3 day stay at [facility] but still needs to confirm with her regional nursing director prior to transfer. Coordinator explains that she will be back in in contact with CSW later to confirm transfer.   TOC team will continue to follow patient for discharge related needs.   Talley Kreiser Sherryle Lis LCSWA Transitions of Care  Clinical Social Worker  Ph: [phone]

## 2019-04-26 DIAGNOSIS — E109 Type 1 diabetes mellitus without complications: Secondary | ICD-10-CM | POA: Diagnosis not present

## 2019-04-26 DIAGNOSIS — N179 Acute kidney failure, unspecified: Secondary | ICD-10-CM | POA: Diagnosis not present

## 2019-04-26 DIAGNOSIS — I480 Paroxysmal atrial fibrillation: Secondary | ICD-10-CM | POA: Diagnosis not present

## 2019-04-26 DIAGNOSIS — G9341 Metabolic encephalopathy: Secondary | ICD-10-CM | POA: Diagnosis not present

## 2019-04-26 DIAGNOSIS — I5020 Unspecified systolic (congestive) heart failure: Secondary | ICD-10-CM | POA: Diagnosis not present

## 2019-04-26 DIAGNOSIS — J189 Pneumonia, unspecified organism: Secondary | ICD-10-CM | POA: Diagnosis not present

## 2019-04-26 DIAGNOSIS — I959 Hypotension, unspecified: Secondary | ICD-10-CM | POA: Diagnosis not present

## 2019-04-30 DIAGNOSIS — E1122 Type 2 diabetes mellitus with diabetic chronic kidney disease: Secondary | ICD-10-CM | POA: Diagnosis not present

## 2019-04-30 DIAGNOSIS — I10 Essential (primary) hypertension: Secondary | ICD-10-CM | POA: Diagnosis not present

## 2019-04-30 DIAGNOSIS — E782 Mixed hyperlipidemia: Secondary | ICD-10-CM | POA: Diagnosis not present

## 2019-04-30 DIAGNOSIS — I509 Heart failure, unspecified: Secondary | ICD-10-CM | POA: Diagnosis not present

## 2019-04-30 DIAGNOSIS — R410 Disorientation, unspecified: Secondary | ICD-10-CM | POA: Diagnosis not present

## 2019-04-30 DIAGNOSIS — I482 Chronic atrial fibrillation, unspecified: Secondary | ICD-10-CM | POA: Diagnosis not present

## 2019-04-30 DIAGNOSIS — G4733 Obstructive sleep apnea (adult) (pediatric): Secondary | ICD-10-CM | POA: Diagnosis not present

## 2019-04-30 DIAGNOSIS — R0902 Hypoxemia: Secondary | ICD-10-CM | POA: Diagnosis not present

## 2019-05-01 DIAGNOSIS — G4733 Obstructive sleep apnea (adult) (pediatric): Secondary | ICD-10-CM | POA: Diagnosis not present

## 2019-05-01 DIAGNOSIS — I5022 Chronic systolic (congestive) heart failure: Secondary | ICD-10-CM | POA: Diagnosis not present

## 2019-05-03 ENCOUNTER — Other Ambulatory Visit: Payer: Self-pay | Admitting: *Deleted

## 2019-05-03 MED ORDER — WARFARIN SODIUM 5 MG PO TABS: 2.5000 mg | ORAL_TABLET | ORAL | 0 refills | Status: DC

## 2019-05-06 DIAGNOSIS — Z20822 Contact with and (suspected) exposure to covid-19: Secondary | ICD-10-CM | POA: Diagnosis not present

## 2019-05-06 DIAGNOSIS — Z1152 Encounter for screening for COVID-19: Secondary | ICD-10-CM | POA: Diagnosis not present

## 2019-05-09 DIAGNOSIS — D649 Anemia, unspecified: Secondary | ICD-10-CM | POA: Diagnosis not present

## 2019-05-09 DIAGNOSIS — D518 Other vitamin B12 deficiency anemias: Secondary | ICD-10-CM | POA: Diagnosis not present

## 2019-05-09 DIAGNOSIS — N189 Chronic kidney disease, unspecified: Secondary | ICD-10-CM | POA: Diagnosis not present

## 2019-05-14 DIAGNOSIS — E109 Type 1 diabetes mellitus without complications: Secondary | ICD-10-CM | POA: Diagnosis not present

## 2019-05-14 DIAGNOSIS — G9341 Metabolic encephalopathy: Secondary | ICD-10-CM | POA: Diagnosis not present

## 2019-05-14 DIAGNOSIS — Z1152 Encounter for screening for COVID-19: Secondary | ICD-10-CM | POA: Diagnosis not present

## 2019-05-14 DIAGNOSIS — I959 Hypotension, unspecified: Secondary | ICD-10-CM | POA: Diagnosis not present

## 2019-05-14 DIAGNOSIS — I5020 Unspecified systolic (congestive) heart failure: Secondary | ICD-10-CM | POA: Diagnosis not present

## 2019-05-14 DIAGNOSIS — I480 Paroxysmal atrial fibrillation: Secondary | ICD-10-CM | POA: Diagnosis not present

## 2019-05-14 DIAGNOSIS — Z20822 Contact with and (suspected) exposure to covid-19: Secondary | ICD-10-CM | POA: Diagnosis not present

## 2019-05-17 ENCOUNTER — Ambulatory Visit (INDEPENDENT_AMBULATORY_CARE_PROVIDER_SITE_OTHER): Payer: Medicare HMO | Admitting: *Deleted

## 2019-05-17 DIAGNOSIS — R69 Illness, unspecified: Secondary | ICD-10-CM | POA: Diagnosis not present

## 2019-05-17 DIAGNOSIS — Z79899 Other long term (current) drug therapy: Secondary | ICD-10-CM | POA: Diagnosis not present

## 2019-05-17 DIAGNOSIS — Z7901 Long term (current) use of anticoagulants: Secondary | ICD-10-CM | POA: Diagnosis not present

## 2019-05-17 DIAGNOSIS — E1165 Type 2 diabetes mellitus with hyperglycemia: Secondary | ICD-10-CM | POA: Diagnosis not present

## 2019-05-17 DIAGNOSIS — I480 Paroxysmal atrial fibrillation: Secondary | ICD-10-CM | POA: Diagnosis not present

## 2019-05-17 DIAGNOSIS — Z9181 History of falling: Secondary | ICD-10-CM | POA: Diagnosis not present

## 2019-05-17 DIAGNOSIS — Z9981 Dependence on supplemental oxygen: Secondary | ICD-10-CM | POA: Diagnosis not present

## 2019-05-17 DIAGNOSIS — Z7982 Long term (current) use of aspirin: Secondary | ICD-10-CM | POA: Diagnosis not present

## 2019-05-17 DIAGNOSIS — Z794 Long term (current) use of insulin: Secondary | ICD-10-CM | POA: Diagnosis not present

## 2019-05-17 DIAGNOSIS — R443 Hallucinations, unspecified: Secondary | ICD-10-CM | POA: Diagnosis not present

## 2019-05-17 LAB — CUP PACEART REMOTE DEVICE CHECK
Battery Remaining Longevity: 67 mo
Battery Remaining Percentage: 78 %
Battery Voltage: 2.98 V
Brady Statistic AP VP Percent: 7.5 %
Brady Statistic AP VS Percent: 1 %
Brady Statistic AS VP Percent: 90 %
Brady Statistic AS VS Percent: 1 %
Brady Statistic RA Percent Paced: 5.2 %
Date Time Interrogation Session: 20210127161602
HighPow Impedance: 91 Ohm
HighPow Impedance: 91 Ohm
Implantable Lead Implant Date: 20191004
Implantable Lead Implant Date: 20191004
Implantable Lead Implant Date: 20191004
Implantable Lead Location: 753858
Implantable Lead Location: 753859
Implantable Lead Location: 753860
Implantable Lead Model: 7122
Implantable Pulse Generator Implant Date: 20191004
Lead Channel Impedance Value: 380 Ohm
Lead Channel Impedance Value: 410 Ohm
Lead Channel Impedance Value: 840 Ohm
Lead Channel Pacing Threshold Amplitude: 0.5 V
Lead Channel Pacing Threshold Amplitude: 0.875 V
Lead Channel Pacing Threshold Amplitude: 1.625 V
Lead Channel Pacing Threshold Pulse Width: 0.5 ms
Lead Channel Pacing Threshold Pulse Width: 0.5 ms
Lead Channel Pacing Threshold Pulse Width: 0.7 ms
Lead Channel Sensing Intrinsic Amplitude: 12 mV
Lead Channel Sensing Intrinsic Amplitude: 2.4 mV
Lead Channel Setting Pacing Amplitude: 2 V
Lead Channel Setting Pacing Amplitude: 2 V
Lead Channel Setting Pacing Amplitude: 2.125
Lead Channel Setting Pacing Pulse Width: 0.5 ms
Lead Channel Setting Pacing Pulse Width: 0.7 ms
Lead Channel Setting Sensing Sensitivity: 0.5 mV
Pulse Gen Serial Number: 9810994

## 2019-05-17 NOTE — Clinical Social Work Note (Signed)
Patient's daughter, Noah [name], called and requested patient be referred to [facility] in [place]. Referral sent via fax to [phone].     Mackenzie Lia, Juleen China, LCSW

## 2019-05-17 NOTE — Progress Notes (Signed)
ICD Remote  

## 2019-05-18 DIAGNOSIS — Z79899 Other long term (current) drug therapy: Secondary | ICD-10-CM | POA: Diagnosis not present

## 2019-05-18 DIAGNOSIS — Z9181 History of falling: Secondary | ICD-10-CM | POA: Diagnosis not present

## 2019-05-18 DIAGNOSIS — R69 Illness, unspecified: Secondary | ICD-10-CM | POA: Diagnosis not present

## 2019-05-18 DIAGNOSIS — I10 Essential (primary) hypertension: Secondary | ICD-10-CM | POA: Diagnosis not present

## 2019-05-18 DIAGNOSIS — R5381 Other malaise: Secondary | ICD-10-CM | POA: Diagnosis not present

## 2019-05-18 DIAGNOSIS — Z9981 Dependence on supplemental oxygen: Secondary | ICD-10-CM | POA: Diagnosis not present

## 2019-05-18 DIAGNOSIS — Z794 Long term (current) use of insulin: Secondary | ICD-10-CM | POA: Diagnosis not present

## 2019-05-18 DIAGNOSIS — Z7982 Long term (current) use of aspirin: Secondary | ICD-10-CM | POA: Diagnosis not present

## 2019-05-18 DIAGNOSIS — E1165 Type 2 diabetes mellitus with hyperglycemia: Secondary | ICD-10-CM | POA: Diagnosis not present

## 2019-05-18 DIAGNOSIS — Z7901 Long term (current) use of anticoagulants: Secondary | ICD-10-CM | POA: Diagnosis not present

## 2019-05-18 DIAGNOSIS — R443 Hallucinations, unspecified: Secondary | ICD-10-CM | POA: Diagnosis not present

## 2019-05-18 DIAGNOSIS — I482 Chronic atrial fibrillation, unspecified: Secondary | ICD-10-CM | POA: Diagnosis not present

## 2019-05-21 DIAGNOSIS — R443 Hallucinations, unspecified: Secondary | ICD-10-CM | POA: Diagnosis not present

## 2019-05-21 DIAGNOSIS — Z9181 History of falling: Secondary | ICD-10-CM | POA: Diagnosis not present

## 2019-05-21 DIAGNOSIS — Z7901 Long term (current) use of anticoagulants: Secondary | ICD-10-CM | POA: Diagnosis not present

## 2019-05-21 DIAGNOSIS — E1165 Type 2 diabetes mellitus with hyperglycemia: Secondary | ICD-10-CM | POA: Diagnosis not present

## 2019-05-21 DIAGNOSIS — Z79899 Other long term (current) drug therapy: Secondary | ICD-10-CM | POA: Diagnosis not present

## 2019-05-21 DIAGNOSIS — R69 Illness, unspecified: Secondary | ICD-10-CM | POA: Diagnosis not present

## 2019-05-21 DIAGNOSIS — Z7982 Long term (current) use of aspirin: Secondary | ICD-10-CM | POA: Diagnosis not present

## 2019-05-21 DIAGNOSIS — Z794 Long term (current) use of insulin: Secondary | ICD-10-CM | POA: Diagnosis not present

## 2019-05-21 DIAGNOSIS — Z9981 Dependence on supplemental oxygen: Secondary | ICD-10-CM | POA: Diagnosis not present

## 2019-05-22 DIAGNOSIS — I482 Chronic atrial fibrillation, unspecified: Secondary | ICD-10-CM | POA: Diagnosis not present

## 2019-05-22 DIAGNOSIS — N183 Chronic kidney disease, stage 3 unspecified: Secondary | ICD-10-CM | POA: Diagnosis not present

## 2019-05-22 DIAGNOSIS — I5022 Chronic systolic (congestive) heart failure: Secondary | ICD-10-CM | POA: Diagnosis not present

## 2019-05-22 DIAGNOSIS — K2100 Gastro-esophageal reflux disease with esophagitis, without bleeding: Secondary | ICD-10-CM | POA: Diagnosis not present

## 2019-05-22 DIAGNOSIS — I10 Essential (primary) hypertension: Secondary | ICD-10-CM | POA: Diagnosis not present

## 2019-05-23 ENCOUNTER — Encounter: Payer: Self-pay | Admitting: Nutrition

## 2019-05-23 ENCOUNTER — Encounter: Payer: Medicare HMO | Attending: Family Medicine | Admitting: Nutrition

## 2019-05-23 ENCOUNTER — Other Ambulatory Visit: Payer: Self-pay

## 2019-05-23 DIAGNOSIS — R443 Hallucinations, unspecified: Secondary | ICD-10-CM | POA: Diagnosis not present

## 2019-05-23 DIAGNOSIS — E669 Obesity, unspecified: Secondary | ICD-10-CM | POA: Diagnosis not present

## 2019-05-23 DIAGNOSIS — Z794 Long term (current) use of insulin: Secondary | ICD-10-CM

## 2019-05-23 DIAGNOSIS — E1121 Type 2 diabetes mellitus with diabetic nephropathy: Secondary | ICD-10-CM | POA: Diagnosis not present

## 2019-05-23 DIAGNOSIS — Z9181 History of falling: Secondary | ICD-10-CM | POA: Diagnosis not present

## 2019-05-23 DIAGNOSIS — I5043 Acute on chronic combined systolic (congestive) and diastolic (congestive) heart failure: Secondary | ICD-10-CM | POA: Insufficient documentation

## 2019-05-23 DIAGNOSIS — N183 Chronic kidney disease, stage 3 unspecified: Secondary | ICD-10-CM | POA: Insufficient documentation

## 2019-05-23 DIAGNOSIS — Z79899 Other long term (current) drug therapy: Secondary | ICD-10-CM | POA: Diagnosis not present

## 2019-05-23 DIAGNOSIS — N1831 Chronic kidney disease, stage 3a: Secondary | ICD-10-CM | POA: Diagnosis not present

## 2019-05-23 DIAGNOSIS — R69 Illness, unspecified: Secondary | ICD-10-CM | POA: Diagnosis not present

## 2019-05-23 DIAGNOSIS — Z7982 Long term (current) use of aspirin: Secondary | ICD-10-CM | POA: Diagnosis not present

## 2019-05-23 DIAGNOSIS — Z9981 Dependence on supplemental oxygen: Secondary | ICD-10-CM | POA: Diagnosis not present

## 2019-05-23 DIAGNOSIS — Z7901 Long term (current) use of anticoagulants: Secondary | ICD-10-CM | POA: Diagnosis not present

## 2019-05-23 DIAGNOSIS — E1165 Type 2 diabetes mellitus with hyperglycemia: Secondary | ICD-10-CM | POA: Diagnosis not present

## 2019-05-23 NOTE — Progress Notes (Signed)
Medical Nutrition Therapy:  Appt start time: 1530 end time:  1630.   Assessment:  Primary concerns today: Diabetes Type 2.  History  of falls and brain bleed. Has had hallucinations starting about a month ago. He is on seizure medication.   Lives with his wife. He does the cooking and shopping. Sees Dr. Neita Carp, PCP. Scheduled to see Dr. Fransico Him, Endocrinology next week. Wt fluctuates  15-20 lbs. Lost about 120 lbs in the last year. Went to rehab after his brain bleed.  Use to see an Endocrinologist in [place]. Now scheduled to see Dr. Fransico Him.   Soliqua 30 units a day. Novolog with meals.   No longer taking Metformin due to CKD. FBS 200's  Usually taking 3-5 units of Novolog with meals depending on premeal BS.  Yesterday: FBS 230 mg/dl    Before lunch 062 mg/dl   Before dinner 376 mg/dl. Eating 3 meals per day and occasionally a snack before bed. Drinks a lot of SF green tea/lemonade mixture. Willing to work on meals and portions to improve his DM.  High Fall risk.  Lab Results  Component Value Date   HGBA1C 8.7 (H) 03/28/2019   CMP Latest Ref Rng & Units 04/25/2019 04/24/2019 04/23/2019  Glucose 70 - 99 mg/dL 283(T) 517(O) 160(V)  BUN 8 - 23 mg/dL 37(T) 06(Y) 69(S)  Creatinine 0.61 - 1.24 mg/dL 8.54(O) 2.70(J) 5.00(X)  Sodium 135 - 145 mmol/L 140 139 140  Potassium 3.5 - 5.1 mmol/L 4.7 4.4 4.6  Chloride 98 - 111 mmol/L 103 105 103  CO2 22 - 32 mmol/L 29 28 30   Calcium 8.9 - 10.3 mg/dL ) 3.8(H) 8.9  Total Protein 6.5 - 8.1 g/dL - - -  Total Bilirubin 0.3 - 1.2 mg/dL - - -  Alkaline Phos 38 - 126 U/L - - -  AST 15 - 41 U/L - - -  ALT 0 - 44 U/L - - -   Lipid Panel     Component Value Date/Time   CHOL  09/09/2007 0300    121        ATP III CLASSIFICATION:  <200     mg/dL   Desirable  09/11/2007  mg/dL   Borderline High  937-169    mg/dL   High   TRIG 92 >=678 0300   HDL 28 (L) 09/09/2007 0300   CHOLHDL 4.3 09/09/2007 0300   VLDL 18 09/09/2007 0300   LDLCALC  09/09/2007 0300   75        Total Cholesterol/HDL:CHD Risk Coronary Heart Disease Risk Table                     Men   Women  1/2 Average Risk   3.4   3.3      Preferred Learning Style:    No preference indicated   Learning Readiness:  Ready  Change in progress   MEDICATIONS:   DIETARY INTAKE:   24-hr recall:  B ( AM):  Eggs, wheat toast 2, grits and apples, diet ocean spray. Snk ( AM):  L ( PM): grilled cheese, or cottage cheese and fruit, [place] diet diet /lemonade Snk ( PM):  D ( PM): shrimp, vegetables, pasta, Diet green tea Snk ( PM): cottage cheese, grapefruit sections  Beverages: diet lemonade and water  Usual physical activity: ADL   Estimated energy needs: 1800 calories 200 g carbohydrates 135 g protein 50 g fat  Progress Towards Goal(s):  In progress.   Nutritional Diagnosis:  NB-1.1  Food and nutrition-related knowledge deficit As related to Diabetes Type 2.  As evidenced by A1C 8.7%.    Intervention:  Nutrition and Diabetes education provided on My Plate, CHO counting, meal planning, portion sizes, timing of meals, avoiding snacks between meals unless having a low blood sugar, target ranges for A1C and blood sugars, signs/symptoms and treatment of hyper/hypoglycemia, monitoring blood sugars, taking medications as prescribed, benefits of exercising 30 minutes per day and prevention of complications of DM.   Goals Follow My Plate  Eat 70-48 grams of CHO per meal Drink only water and cut out sf beverages Increase fresh fruits and lower carb vegetables. Eat meals on time Don't eat past 7 pm or between meals unless a low blood sugar. Stop eating grapefruit.  Teaching Method Utilized:  Visual Auditory Hands on  Handouts given during visit include:  The Plate Method   Meal Plan  Diabetes Instructions    Barriers to learning/adherence to lifestyle change: none  Demonstrated degree of understanding via:  Teach Back   Monitoring/Evaluation:  Dietary intake,  exercise, , and body weight in 1 month(s).

## 2019-05-23 NOTE — Patient Instructions (Signed)
Goals Follow My Plate  Eat 15-52 grams of CHO per meal Drink only water and cut out sf beverages Increase fresh fruits and lower carb vegetables. Eat meals on time Don't eat past 7 pm or between meals unless a low blood sugar. Stop eating grapefruit.

## 2019-05-24 ENCOUNTER — Ambulatory Visit (INDEPENDENT_AMBULATORY_CARE_PROVIDER_SITE_OTHER): Payer: Medicare HMO | Admitting: Clinical

## 2019-05-24 DIAGNOSIS — Z79899 Other long term (current) drug therapy: Secondary | ICD-10-CM | POA: Diagnosis not present

## 2019-05-24 DIAGNOSIS — E1165 Type 2 diabetes mellitus with hyperglycemia: Secondary | ICD-10-CM | POA: Diagnosis not present

## 2019-05-24 DIAGNOSIS — Z7901 Long term (current) use of anticoagulants: Secondary | ICD-10-CM | POA: Diagnosis not present

## 2019-05-24 DIAGNOSIS — Z9181 History of falling: Secondary | ICD-10-CM | POA: Diagnosis not present

## 2019-05-24 DIAGNOSIS — Z794 Long term (current) use of insulin: Secondary | ICD-10-CM | POA: Diagnosis not present

## 2019-05-24 DIAGNOSIS — R443 Hallucinations, unspecified: Secondary | ICD-10-CM | POA: Diagnosis not present

## 2019-05-24 DIAGNOSIS — F331 Major depressive disorder, recurrent, moderate: Secondary | ICD-10-CM | POA: Diagnosis not present

## 2019-05-24 DIAGNOSIS — R69 Illness, unspecified: Secondary | ICD-10-CM | POA: Diagnosis not present

## 2019-05-24 DIAGNOSIS — Z9981 Dependence on supplemental oxygen: Secondary | ICD-10-CM | POA: Diagnosis not present

## 2019-05-24 DIAGNOSIS — Z7982 Long term (current) use of aspirin: Secondary | ICD-10-CM | POA: Diagnosis not present
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
## 2019-05-28 ENCOUNTER — Encounter: Payer: Self-pay | Admitting: Cardiovascular Disease

## 2019-05-28 ENCOUNTER — Other Ambulatory Visit: Payer: Self-pay

## 2019-05-28 ENCOUNTER — Ambulatory Visit (INDEPENDENT_AMBULATORY_CARE_PROVIDER_SITE_OTHER): Payer: Medicare HMO | Admitting: Cardiovascular Disease

## 2019-05-28 VITALS — BP 139/83 | HR 74 | Ht 68.0 in | Wt 274.0 lb

## 2019-05-28 DIAGNOSIS — I480 Paroxysmal atrial fibrillation: Secondary | ICD-10-CM

## 2019-05-28 DIAGNOSIS — I25118 Atherosclerotic heart disease of native coronary artery with other forms of angina pectoris: Secondary | ICD-10-CM | POA: Diagnosis not present

## 2019-05-28 DIAGNOSIS — E785 Hyperlipidemia, unspecified: Secondary | ICD-10-CM | POA: Diagnosis not present

## 2019-05-28 DIAGNOSIS — I5042 Chronic combined systolic (congestive) and diastolic (congestive) heart failure: Secondary | ICD-10-CM

## 2019-05-28 DIAGNOSIS — N183 Chronic kidney disease, stage 3 unspecified: Secondary | ICD-10-CM | POA: Diagnosis not present

## 2019-05-28 DIAGNOSIS — I10 Essential (primary) hypertension: Secondary | ICD-10-CM | POA: Diagnosis not present

## 2019-05-28 NOTE — Progress Notes (Signed)
SUBJECTIVE: The patient presents for routine follow-up.  He was hospitalized in January for altered mental status/acute encephalopathy deemed secondary to volume depletion in the setting of antihypertensive medications.  Lasix was reduced to 40 mg daily.  Entresto dose was reduced.  He underwent Saint Jude biventricular ICD implantation on 01/20/2018. He has coronary artery disease and chronic systolic heart failure with prior stent placement.  He underwent coronary angiography on 05/30/2006 and underwent percutaneous transluminal coronary angioplasty and drug-eluting stent placement to the circumflex. 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  Echocardiogram on 05/18/2018 demonstrated an improvement in left ventricular systolic function, LVEF up to 40 to 45%.  There was moderate LVH.  Wall motion abnormalities were noted.  Nuclear stress test on 10/14/2017 showed a large prior inferior myocardial infarction. There was no significant ischemia.  ABIs in June 2020 demonstrated left-sided tibial disease.  He denies chest pain, palpitations, leg swelling, orthopnea, and shortness of breath.  He occasionally hallucinates.  He is following up with behavioral therapy.  He also meets with a nutritionist.  He tries to avoid salt.    Review of Systems: As per "subjective", otherwise negative.  Allergies  Allergen Reactions  . Codeine Other (See Comments)    constipation  . Erythromycin-Sulfisoxazole Other (See Comments)    Causes infection in throat and eyes    Current Outpatient Medications  Medication Sig Dispense Refill  . acetaminophen (TYLENOL) 650 MG CR tablet Take 650-1,300 mg by mouth daily as needed for pain.     Marland Kitchen aspirin EC 81 MG tablet Take 1 tablet (81 mg total) by mouth daily with breakfast. 90 tablet 3  . cetirizine (ZYRTEC) 10 MG tablet Take 10 mg by mouth daily.    . finasteride  (PROSCAR) 5 MG tablet Take 5 mg by mouth every evening.   4  . FLUoxetine (PROZAC) 20 MG capsule Take 20 mg by mouth every evening.    . furosemide (LASIX) 40 MG tablet Take 1 tablet (40 mg total) by mouth daily. 30 tablet 2  . gabapentin (NEURONTIN) 100 MG capsule Take 1 capsule (100 mg total) by mouth 2 (two) times daily. 60 capsule 3  . insulin aspart (NOVOLOG) 100 UNIT/ML injection insulin aspart (novoLOG) injection 0-9 Units 0-9 Units, Subcutaneous, 3 times daily with meals, CBG < 70: Implement Hypoglycemia Standing Orders and refer to Hypoglycemia Standing Orders sidebar report  CBG 70 - 120: 0 units CBG 121 - 150: 0 unit CBG 151 - 200: 2 units CBG 201 - 250: 3 units CBG 251 - 300: 5 units CBG 301 - 350: 7 units CBG 351 - 400: 9 units CBG > 400: 10 mL 3  . Insulin Glargine-Lixisenatide (SOLIQUA) 100-33 UNT-MCG/ML SOPN Inject 30 Units into the skin every evening.    . levETIRAcetam (KEPPRA) 750 MG tablet Take 750 mg by mouth 2 (two) times daily.    . metoprolol succinate (TOPROL XL) 50 MG 24 hr tablet Take 1 tablet (50 mg total) by mouth daily. Take with or immediately following a meal. 30 tablet 3  . Omega-3 Fatty Acids (FISH OIL) 1000 MG CAPS Take 1,000 mg by mouth 2 (two) times daily.     . pantoprazole (PROTONIX) 40 MG tablet Take 1 tablet (40 mg total) by mouth daily. 30 tablet 2  . sacubitril-valsartan (ENTRESTO) 24-26 MG Take 1 tablet by mouth 2 (two) times daily. 60 tablet  3  . simvastatin (ZOCOR) 40 MG tablet Take 40 mg by mouth daily.    . Tamsulosin HCl (FLOMAX) 0.4 MG CAPS Take 0.8 mg by mouth every evening.    . warfarin (COUMADIN) 5 MG tablet Take 0.5-1 tablets (2.5-5 mg total) by mouth See admin instructions. Take 1/2 tablet (2.5mg ) daily except 1 tablet (5mg ) on Tuesdays, Thursdays and Saturdays 30 tablet 0  . Zinc 50 MG TABS Take 200 mg by mouth daily.     No current facility-administered medications for this visit.    Past Medical History:  Diagnosis Date  . CAD S/P  percutaneous coronary angioplasty    remote PCIs in 1990's- last CFX PCI 2008  . Chronic kidney disease, stage III (moderate)   . Diabetes mellitus with complication (HCC)    with hyperglycemia and diabetic autonomic poly neuropathy  . Gastro-esophageal reflux disease with esophagitis   . Hyperlipidemia   . Hypertension   . Ischemic cardiomyopathy 01/20/2018   St Jude ICD   . Morbid obesity (HCC)   . Obstructive sleep apnea    on CPAP  . Polyneuropathy   . Primary insomnia     Past Surgical History:  Procedure Laterality Date  . BIV ICD INSERTION CRT-D N/A 01/20/2018   Procedure: BIV ICD INSERTION CRT-D;  Surgeon: 03/22/2018, MD;  Location: [facility] INVASIVE CV LAB;  Service: Cardiovascular;  Laterality: N/A;  . BIV PACEMAKER INSERTION CRT-P  01/20/2018   St Jude- Dr 03/22/2018  . CARDIAC CATHETERIZATION     multiple angioplasty X 8, 4 stents     Social History   Socioeconomic History  . Marital status: Married    Spouse name: Not on file  . Number of children: Not on file  . Years of education: Not on file  . Highest education level: Not on file  Occupational History  . Occupation: Retired  Tobacco Use  . Smoking status: Former Smoker    Packs/day: 1.00    Years: 30.00    Pack years: 30.00    Quit date: 09/01/2000    Years since quitting: 18.7  . Smokeless tobacco: Never Used  Substance and Sexual Activity  . Alcohol use: Yes    Alcohol/week: 0.0 standard drinks  . Drug use: No  . Sexual activity: Not on file  Other Topics Concern  . Not on file  Social History Narrative  . Not on file   Social Determinants of Health   Financial Resource Strain:   . Difficulty of Paying Living Expenses: Not on file  Food Insecurity:   . Worried About 09/03/2000 in the Last Year: Not on file  . Ran Out of Food in the Last Year: Not on file  Transportation Needs:   . Lack of Transportation (Medical): Not on file  . Lack of Transportation (Non-Medical): Not on file    Physical Activity:   . Days of Exercise per Week: Not on file  . Minutes of Exercise per Session: Not on file  Stress:   . Feeling of Stress : Not on file  Social Connections:   . Frequency of Communication with Friends and Family: Not on file  . Frequency of Social Gatherings with Friends and Family: Not on file  . Attends Religious Services: Not on file  . Active Member of Clubs or Organizations: Not on file  . Attends Programme researcher, broadcasting/film/video Meetings: Not on file  . Marital Status: Not on file  Intimate Partner Violence:   . Fear  of Current or Ex-Partner: Not on file  . Emotionally Abused: Not on file  . Physically Abused: Not on file  . Sexually Abused: Not on file     Vitals:   05/28/19 1258  BP: 139/83  Pulse: 74  SpO2: 99%  Weight: 274 lb (124.3 kg)  Height: 5\' 8"  (1.727 m)    Wt Readings from Last 3 Encounters:  05/28/19 274 lb (124.3 kg)  04/25/19 282 lb 6.6 oz (128.1 kg)  04/03/19 295 lb (133.8 kg)     PHYSICAL EXAM General: Morbidly obese male in NAD HEENT: Normal. Neck: No JVD, no thyromegaly. Lungs: Clear to auscultation bilaterally with normal respiratory effort. CV: Regular rate and rhythm, normal S1/S2, no S3/S4, no murmur. No pretibial or periankle edema.    Abdomen: Soft, nontender, obese.  Neurologic: Alert and oriented.  Psych: Normal affect. Skin: Normal. Musculoskeletal: No gross deformities.      Labs:    Lipids: Lab Results  Component Value Date/Time   LDLCALC  09/09/2007 03:00 AM    75        Total Cholesterol/HDL:CHD Risk Coronary Heart Disease Risk Table                     Men   Women  1/2 Average Risk   3.4   3.3   CHOL  09/09/2007 03:00 AM    121        ATP III CLASSIFICATION:  <200     mg/dL   Desirable  200-239  mg/dL   Borderline High  >=240    mg/dL   High   TRIG 92 09/09/2007 03:00 AM   HDL 28 (L) 09/09/2007 03:00 AM       ASSESSMENT AND PLAN: 1. Coronaryarterydisease: Historyof inferior wall MI and  multivessel stenting.Symptomatically stable.Currently on aspirin81mg , Entresto,Toprol-XL,and simvastatin80 mg. Nuclear stress test reviewed above with no evidence of ischemia and large area of inferior myocardial scar.  2. Hypertension: Blood pressure is borderline elevated.  No changes to therapy.  3. Hyperlipidemia:  Continue atorvastatin.  4. 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   Continue Toprol-XL, Entresto, and Lasix.  5. Morbid obesity: He needs significant weight loss.   6. Paroxysmal atrial fibrillation: Anticoagulated with warfarin. Currently on Toprol-XL for rate control.  7. Chronic kidney disease stage III:  BUN 28, creatinine 1.49 on 04/25/2019.    Disposition: Follow up 6 months virtual visit   Kate Sable, M.D., F.A.C.C.

## 2019-05-28 NOTE — Patient Instructions (Addendum)
Medication Instructions:  Continue all current medications.   Labwork: none  Testing/Procedures: none  Follow-Up: 6 months   Any Other Special Instructions Will Be Listed Below (If Applicable).   If you need a refill on your cardiac medications before your next appointment, please call your pharmacy.  

## 2019-05-29 DIAGNOSIS — I4891 Unspecified atrial fibrillation: Secondary | ICD-10-CM | POA: Diagnosis not present

## 2019-06-01 ENCOUNTER — Other Ambulatory Visit: Payer: Self-pay

## 2019-06-01 ENCOUNTER — Ambulatory Visit (INDEPENDENT_AMBULATORY_CARE_PROVIDER_SITE_OTHER): Payer: Medicare HMO | Admitting: "Endocrinology

## 2019-06-01 ENCOUNTER — Encounter: Payer: Self-pay | Admitting: "Endocrinology

## 2019-06-01 VITALS — BP 143/73 | HR 82 | Ht 68.0 in | Wt 270.6 lb

## 2019-06-01 DIAGNOSIS — E782 Mixed hyperlipidemia: Secondary | ICD-10-CM

## 2019-06-01 DIAGNOSIS — Z7982 Long term (current) use of aspirin: Secondary | ICD-10-CM | POA: Diagnosis not present

## 2019-06-01 DIAGNOSIS — Z7901 Long term (current) use of anticoagulants: Secondary | ICD-10-CM | POA: Diagnosis not present

## 2019-06-01 DIAGNOSIS — E1121 Type 2 diabetes mellitus with diabetic nephropathy: Secondary | ICD-10-CM

## 2019-06-01 DIAGNOSIS — Z794 Long term (current) use of insulin: Secondary | ICD-10-CM

## 2019-06-01 DIAGNOSIS — Z79899 Other long term (current) drug therapy: Secondary | ICD-10-CM | POA: Diagnosis not present

## 2019-06-01 DIAGNOSIS — G4733 Obstructive sleep apnea (adult) (pediatric): Secondary | ICD-10-CM | POA: Diagnosis not present

## 2019-06-01 DIAGNOSIS — N1831 Chronic kidney disease, stage 3a: Secondary | ICD-10-CM | POA: Diagnosis not present

## 2019-06-01 DIAGNOSIS — E1165 Type 2 diabetes mellitus with hyperglycemia: Secondary | ICD-10-CM | POA: Diagnosis not present

## 2019-06-01 DIAGNOSIS — Z9181 History of falling: Secondary | ICD-10-CM | POA: Diagnosis not present

## 2019-06-01 DIAGNOSIS — I10 Essential (primary) hypertension: Secondary | ICD-10-CM | POA: Diagnosis not present

## 2019-06-01 DIAGNOSIS — Z9981 Dependence on supplemental oxygen: Secondary | ICD-10-CM | POA: Diagnosis not present

## 2019-06-01 DIAGNOSIS — R69 Illness, unspecified: Secondary | ICD-10-CM | POA: Diagnosis not present

## 2019-06-01 DIAGNOSIS — R443 Hallucinations, unspecified: Secondary | ICD-10-CM | POA: Diagnosis not present

## 2019-06-01 DIAGNOSIS — I5022 Chronic systolic (congestive) heart failure: Secondary | ICD-10-CM | POA: Diagnosis not present

## 2019-06-01 MED ORDER — FREESTYLE LIBRE 14 DAY READER DEVI: 1.0000 | Freq: Once | 0 refills | Status: AC

## 2019-06-01 MED ORDER — SOLIQUA 100-33 UNT-MCG/ML ~~LOC~~ SOPN: 50.0000 [IU] | PEN_INJECTOR | Freq: Every day | SUBCUTANEOUS | Status: DC

## 2019-06-01 MED ORDER — FREESTYLE LIBRE 14 DAY SENSOR MISC: 1.0000 | 2 refills | Status: AC

## 2019-06-01 NOTE — Patient Instructions (Signed)

## 2019-06-01 NOTE — Progress Notes (Signed)
Endocrinology Consult Note       06/01/2019, 10:29 AM   Subjective:    Patient ID: Noah [name], male    DOB: [birth year].  Noah [name] is being seen in consultation for management of currently uncontrolled symptomatic diabetes requested by  Estanislado Pandy, MD.   Past Medical History:  Diagnosis Date  . CAD S/P percutaneous coronary angioplasty    remote PCIs in 1990's- last CFX PCI 2008  . Chronic kidney disease, stage III (moderate)   . Diabetes mellitus with complication (HCC)    with hyperglycemia and diabetic autonomic poly neuropathy  . Gastro-esophageal reflux disease with esophagitis   . Hyperlipidemia   . Hypertension   . Ischemic cardiomyopathy 01/20/2018   St Jude ICD   . Morbid obesity (HCC)   . Obstructive sleep apnea    on CPAP  . Polyneuropathy   . Primary insomnia     Past Surgical History:  Procedure Laterality Date  . BIV ICD INSERTION CRT-D N/A 01/20/2018   Procedure: BIV ICD INSERTION CRT-D;  Surgeon: Marinus Maw, MD;  Location: [facility] INVASIVE CV LAB;  Service: Cardiovascular;  Laterality: N/A;  . BIV PACEMAKER INSERTION CRT-P  01/20/2018   St Jude- Dr Ladona Ridgel  . CARDIAC CATHETERIZATION     multiple angioplasty X 8, 4 stents     Social History   Socioeconomic History  . Marital status: Married    Spouse name: Not on file  . Number of children: Not on file  . Years of education: Not on file  . Highest education level: Not on file  Occupational History  . Occupation: Retired  Tobacco Use  . Smoking status: Former Smoker    Packs/day: 1.00    Years: 30.00    Pack years: 30.00    Quit date: 09/01/2000    Years since quitting: 18.7  . Smokeless tobacco: Never Used  Substance and Sexual Activity  . Alcohol use: Yes    Alcohol/week: 0.0 standard drinks  . Drug use: No  . Sexual activity: Not on file  Other Topics Concern  . Not on file  Social History  Narrative  . Not on file   Social Determinants of Health   Financial Resource Strain:   . Difficulty of Paying Living Expenses: Not on file  Food Insecurity:   . Worried About Programme researcher, broadcasting/film/video in the Last Year: Not on file  . Ran Out of Food in the Last Year: Not on file  Transportation Needs:   . Lack of Transportation (Medical): Not on file  . Lack of Transportation (Non-Medical): Not on file  Physical Activity:   . Days of Exercise per Week: Not on file  . Minutes of Exercise per Session: Not on file  Stress:   . Feeling of Stress : Not on file  Social Connections:   . Frequency of Communication with Friends and Family: Not on file  . Frequency of Social Gatherings with Friends and Family: Not on file  . Attends Religious Services: Not on file  . Active Member of Clubs or Organizations: Not on file  . Attends Club or  Organization Meetings: Not on file  . Marital Status: Not on file    Family History  Problem Relation Age of Onset  . Heart attack Father   . Breast cancer Mother   . CAD Mother   . Cancer Brother     Outpatient Encounter Medications as of 06/01/2019  Medication Sig  . acetaminophen (TYLENOL) 650 MG CR tablet Take 650-1,300 mg by mouth daily as needed for pain.   Marland Kitchen aspirin EC 81 MG tablet Take 1 tablet (81 mg total) by mouth daily with breakfast.  . cetirizine (ZYRTEC) 10 MG tablet Take 10 mg by mouth daily.  . finasteride (PROSCAR) 5 MG tablet Take 5 mg by mouth every evening.   Marland Kitchen FLUoxetine (PROZAC) 20 MG capsule Take 20 mg by mouth every evening.  . furosemide (LASIX) 40 MG tablet Take 1 tablet (40 mg total) by mouth daily.  Marland Kitchen gabapentin (NEURONTIN) 100 MG capsule Take 1 capsule (100 mg total) by mouth 2 (two) times daily.  . Insulin Glargine-Lixisenatide (SOLIQUA) 100-33 UNT-MCG/ML SOPN Inject 50 Units into the skin daily with breakfast.  . levETIRAcetam (KEPPRA) 750 MG tablet Take 750 mg by mouth 2 (two) times daily.  . metoprolol succinate (TOPROL  XL) 50 MG 24 hr tablet Take 1 tablet (50 mg total) by mouth daily. Take with or immediately following a meal.  . Omega-3 Fatty Acids (FISH OIL) 1000 MG CAPS Take 1,000 mg by mouth 2 (two) times daily.   . pantoprazole (PROTONIX) 40 MG tablet Take 1 tablet (40 mg total) by mouth daily.  . sacubitril-valsartan (ENTRESTO) 24-26 MG Take 1 tablet by mouth 2 (two) times daily.  . simvastatin (ZOCOR) 40 MG tablet Take 40 mg by mouth daily.  . Tamsulosin HCl (FLOMAX) 0.4 MG CAPS Take 0.8 mg by mouth every evening.  . warfarin (COUMADIN) 5 MG tablet Take 0.5-1 tablets (2.5-5 mg total) by mouth See admin instructions. Take 1/2 tablet (2.5mg ) daily except 1 tablet (5mg ) on Tuesdays, Thursdays and Saturdays  . Zinc 50 MG TABS Take 200 mg by mouth daily.  . [DISCONTINUED] insulin aspart (NOVOLOG) 100 UNIT/ML injection insulin aspart (novoLOG) injection 0-9 Units 0-9 Units, Subcutaneous, 3 times daily with meals, CBG < 70: Implement Hypoglycemia Standing Orders and refer to Hypoglycemia Standing Orders sidebar report  CBG 70 - 120: 0 units CBG 121 - 150: 0 unit CBG 151 - 200: 2 units CBG 201 - 250: 3 units CBG 251 - 300: 5 units CBG 301 - 350: 7 units CBG 351 - 400: 9 units CBG > 400:  . [DISCONTINUED] Insulin Glargine-Lixisenatide (SOLIQUA) 100-33 UNT-MCG/ML SOPN Inject 30 Units into the skin every evening.   No facility-administered encounter medications on file as of 06/01/2019.    ALLERGIES: Allergies  Allergen Reactions  . Codeine Other (See Comments)    constipation  . Erythromycin-Sulfisoxazole Other (See Comments)    Causes infection in throat and eyes    VACCINATION STATUS: Immunization History  Administered Date(s) Administered  . Influenza-Unspecified 02/08/2018    Diabetes He presents for his initial diabetic visit. He has type 2 diabetes mellitus. Onset time: He was diagnosed at approximate age of 30 years. His disease course has been worsening. There are no hypoglycemic associated  symptoms. Pertinent negatives for hypoglycemia include no confusion, headaches, pallor or seizures. Associated symptoms include polydipsia and polyuria. Pertinent negatives for diabetes include no chest pain, no fatigue, no polyphagia and no weakness. There are no hypoglycemic complications. Symptoms are worsening. Diabetic complications include heart  disease, nephropathy and peripheral neuropathy. Risk factors for coronary artery disease include diabetes mellitus, dyslipidemia, hypertension, male sex, obesity, sedentary lifestyle, tobacco exposure and family history. Current diabetic treatments: He is currently on Soliqua 30 units nightly, NovoLog sliding scale. His weight is decreasing steadily. He is following a generally unhealthy diet. When asked about meal planning, he reported none. He has had a previous visit with a dietitian. He rarely (Has a very limited exercise capacity due to disequilibrium.) participates in exercise. His home blood glucose trend is fluctuating minimally. (She did not bring any logs nor meter to review with him.  Patient is helped by his brother.  He mentions his blood glucose ranges between 150- 250 milligrams per DL. His most recent A1c was 8.7% in December 2020.   ) An ACE inhibitor/angiotensin II receptor blocker is not being taken. Eye exam is current.  Hyperlipidemia This is a chronic problem. The current episode started more than 1 year ago. The problem is controlled. Exacerbating diseases include diabetes and obesity. Pertinent negatives include no chest pain, myalgias or shortness of breath. Risk factors for coronary artery disease include diabetes mellitus, dyslipidemia, family history, obesity, male sex, hypertension and a sedentary lifestyle.  Hypertension This is a chronic problem. The current episode started more than 1 year ago. Pertinent negatives include no chest pain, headaches, neck pain, palpitations or shortness of breath. Risk factors for coronary artery  disease include dyslipidemia, diabetes mellitus, obesity, male gender, family history, sedentary lifestyle and smoking/tobacco exposure. Hypertensive end-organ damage includes kidney disease and CAD/MI.     Review of Systems  Constitutional: Negative for chills, fatigue, fever and unexpected weight change.  HENT: Negative for dental problem, mouth sores and trouble swallowing.   Eyes: Negative for visual disturbance.  Respiratory: Negative for cough, choking, chest tightness, shortness of breath and wheezing.   Cardiovascular: Negative for chest pain, palpitations and leg swelling.  Gastrointestinal: Negative for abdominal distention, abdominal pain, constipation, diarrhea, nausea and vomiting.  Endocrine: Positive for polydipsia and polyuria. Negative for polyphagia.  Genitourinary: Negative for dysuria, flank pain, hematuria and urgency.  Musculoskeletal: Negative for back pain, gait problem, myalgias and neck pain.  Skin: Negative for pallor, rash and wound.  Neurological: Negative for seizures, syncope, weakness, numbness and headaches.  Psychiatric/Behavioral: Negative for confusion and dysphoric mood.    Objective:    Vitals with BMI 06/01/2019 05/28/2019 04/25/2019  Height 5\' 8"  5\' 8"  -  Weight 270 lbs 10 oz 274 lbs -  BMI 41.15 41.67 -  Systolic 143 139  Diastolic 73 83 67  Pulse 82 74 62    BP (!) 143/73   Pulse 82   Ht 5\' 8"  (1.727 m)   Wt 270 lb 9.6 oz (122.7 kg)   BMI 41.14 kg/m   Wt Readings from Last 3 Encounters:  06/01/19 270 lb 9.6 oz (122.7 kg)  05/28/19 274 lb (124.3 kg)  04/25/19 282 lb 6.6 oz (128.1 kg)     Physical Exam Constitutional:      General: He is not in acute distress.    Appearance: He is well-developed.     Comments: Walks with a cane due to disequilibrium.  HENT:     Head: Normocephalic and atraumatic.  Neck:     Thyroid: No thyromegaly.     Trachea: No tracheal deviation.  Cardiovascular:     Rate and Rhythm: Normal rate.      Pulses:          Dorsalis pedis pulses  are 1+ on the right side and 1+ on the left side.       Posterior tibial pulses are 1+ on the right side and 1+ on the left side.     Heart sounds: Normal heart sounds, S1 normal and S2 normal. No murmur. No gallop.   Pulmonary:     Effort: Pulmonary effort is normal. No respiratory distress.     Breath sounds: Normal breath sounds. No wheezing.  Abdominal:     General: Bowel sounds are normal. There is no distension.     Palpations: Abdomen is soft.     Tenderness: There is no abdominal tenderness. There is no guarding.  Musculoskeletal:     Right shoulder: No swelling or deformity.     Cervical back: Normal range of motion and neck supple.  Skin:    General: Skin is warm and dry.     Findings: No rash.     Nails: There is no clubbing.  Neurological:     Mental Status: He is alert and oriented to person, place, and time.     Cranial Nerves: No cranial nerve deficit.     Sensory: No sensory deficit.     Gait: Gait normal.     Deep Tendon Reflexes: Reflexes are normal and symmetric.  Psychiatric:        Speech: Speech normal.        Behavior: Behavior normal. Behavior is cooperative.        Thought Content: Thought content normal.        Judgment: Judgment normal.      CMP     Component Value Date/Time   NA 140 04/25/2019 0526   K 4.7 04/25/2019 0526   CL 103 04/25/2019 0526   CO2 29 04/25/2019 0526   GLUCOSE 197 (H) 04/25/2019 0526   BUN 28 (H) 04/25/2019 0526   CREATININE 1.49 (H) 04/25/2019 0526   CALCIUM 8.8 (L) 04/25/2019 0526   PROT 6.2 (L) 04/11/2019 0512   ALBUMIN 2.6 (L) 04/25/2019 0526   AST 14 (L) 04/11/2019 0512   ALT 13 04/11/2019 0512   ALKPHOS 61 04/11/2019 0512   BILITOT 0.8 04/11/2019 0512   GFRNONAA 44 (L) 04/25/2019 0526   GFRAA 51 (L) 04/25/2019 0526     Diabetic Labs (most recent): Lab Results  Component Value Date   HGBA1C 8.7 (H) 03/28/2019   HGBA1C 9.5 (H) 05/19/2018     Lipid Panel ( most  recent) Lipid Panel     Component Value Date/Time   CHOL  09/09/2007 0300    121        ATP III CLASSIFICATION:  <200     mg/dL   Desirable  784-696  mg/dL   Borderline High  >=295    mg/dL   High   TRIG 92 28/41/3244 0300   HDL 28 (L) 09/09/2007 0300   CHOLHDL 4.3 09/09/2007 0300   VLDL 18 09/09/2007 0300   LDLCALC  09/09/2007 0300    75        Total Cholesterol/HDL:CHD Risk Coronary Heart Disease Risk Table                     Men   Women  1/2 Average Risk   3.4   3.3     TSH 3.68 on April 10, 2019       Assessment & Plan:   1. Type 2 diabetes mellitus with stage 3a chronic kidney disease, with long-term current use of insulin (  HCC) - Noah [name] has currently uncontrolled symptomatic type 2 DM since  79 years of age. Recent labs reviewed.  She did not bring any logs nor meter to review with him.  Patient is being helped by his brother.  He mentions his blood glucose ranges between 150- 250 mg/ DL. His most recent A1c was 8.7% in December 2020.   - I had a long discussion with him about the progressive nature of diabetes and the pathology behind its complications. -his diabetes is complicated by coronary artery disease/CHF, CKD, peripheral neuropathy, obesity/sedentary life and he remains at a high risk for more acute and chronic complications which include CAD, CVA, CKD, retinopathy, and neuropathy. These are all discussed in detail with him.  - I have counseled him on diet  and weight management  by adopting a carbohydrate restricted/protein rich diet. Patient is encouraged to switch to  unprocessed or minimally processed   complex starch and increased protein intake (animal or plant source), fruits, and vegetables. -  he is advised to stick to a routine mealtimes to eat 3 meals  a day and avoid unnecessary snacks ( to snack only to correct hypoglycemia).   - he admits that there is a room for improvement in his food and drink choices. - Suggestion is made for him  to avoid simple carbohydrates  from his diet including Cakes, Sweet Desserts, Ice Cream, Soda (diet and regular), Sweet Tea, Candies, Chips, Cookies, Store Bought Juices, Alcohol in Excess of  1-2 drinks a day, Artificial Sweeteners,  Coffee Creamer, and "Sugar-free" Products. This will help patient to have more stable blood glucose profile and potentially avoid unintended weight gain.  - he will be scheduled with Norm Salt, RDN, CDE for diabetes education.  - I have approached him with the following individualized plan to manage  his diabetes and patient agrees:   - he will benefit from simplified treatment regimen.   -He will tolerate higher dose of Soliqua.  I discussed and increase his Soliqua to 50 units and switch to morning with breakfast, associated with monitoring of blood glucose twice a day-daily before breakfast and at bedtime.   - he is encouraged to call clinic for blood glucose levels less than 70 or above 200 mg /dl in the morning. -He is advised to hold his NovoLog for now. -He will be on a phone visit 10 days from now.  If he is found to have significant postprandial hyperglycemia, he will be considered for low-dose glipizide therapy. - Specific targets for  A1c;  LDL, HDL,  and Triglycerides were discussed with the patient.  2) Blood Pressure /Hypertension:  his blood pressure is not controlled to target.   he is advised to continue his current medications including metoprolol 50 mg daily and Lasix as needed.  He will be considered for low-dose ACE inhibitor if he is found to have significantly above target blood pressure at next visit.   3) Lipids/Hyperlipidemia:   Review of his recent lipid panel showed  controlled  LDL at 75 .  he  is advised to continue    simvastatin 40 mg daily at bedtime.  Side effects and precautions discussed with him.  4)  Weight/Diet:  Body mass index is 41.14 kg/m.  -   clearly complicating his diabetes care.   he is  a candidate for modest  weight loss. I discussed with him the fact that loss of 5 - 10% of his  current body weight will have  the most impact on his diabetes management.  Exercise, and detailed carbohydrates information provided  -  detailed on discharge instructions.  5) Chronic Care/Health Maintenance:  -he  is on and Statin medications and  is encouraged to initiate and continue to follow up with Ophthalmology, Dentist,  Podiatrist at least yearly or according to recommendations, and advised to   stay away from smoking. I have recommended yearly flu vaccine and pneumonia vaccine at least every 5 years; moderate intensity exercise for up to 150 minutes weekly; and  sleep for at least 7 hours a day.  - he is  advised to maintain close follow up with Sasser, Clarene Critchley, MD for primary care needs, as well as his other providers for optimal and coordinated care.   - Time spent in this patient care: 60 min, of which > 50% was spent in  counseling  him about his currently uncontrolled, complicated type 2 diabetes, hyperlipidemia, hypertension and the rest reviewing his blood glucose logs , discussing his hypoglycemia and hyperglycemia episodes, reviewing his current and  previous labs / studies  ( including abstraction from other facilities) and medications  doses and developing a  long term treatment plan based on the latest standards of care/ guidelines; and documenting his care.    Please refer to Patient Instructions for Blood Glucose Monitoring and Insulin/Medications Dosing Guide"  in media tab for additional information. Please  also refer to " Patient Self Inventory" in the Media  tab for reviewed elements of pertinent patient history.  Clare Charon Mulford participated in the discussions, expressed understanding, and voiced agreement with the above plans.  All questions were answered to his satisfaction. he is encouraged to contact clinic should he have any questions or concerns prior to his return visit.   Follow up plan: -  Return in about 10 days (around 06/11/2019), or phone, for Follow up with Meter and Logs Only - no Labs.  Marquis Lunch, MD [facility] [address] [place], [place] [address] Phone: [phone]  Fax: [phone]    06/01/2019, 10:29 AM  This note was partially dictated with voice recognition software. Similar sounding words can be transcribed inadequately or may not  be corrected upon review.

## 2019-06-04 ENCOUNTER — Encounter: Payer: Self-pay | Admitting: Nutrition

## 2019-06-04 DIAGNOSIS — Z9981 Dependence on supplemental oxygen: Secondary | ICD-10-CM | POA: Diagnosis not present

## 2019-06-04 DIAGNOSIS — Z79899 Other long term (current) drug therapy: Secondary | ICD-10-CM | POA: Diagnosis not present

## 2019-06-04 DIAGNOSIS — R443 Hallucinations, unspecified: Secondary | ICD-10-CM | POA: Diagnosis not present

## 2019-06-04 DIAGNOSIS — Z794 Long term (current) use of insulin: Secondary | ICD-10-CM | POA: Diagnosis not present

## 2019-06-04 DIAGNOSIS — Z7901 Long term (current) use of anticoagulants: Secondary | ICD-10-CM | POA: Diagnosis not present

## 2019-06-04 DIAGNOSIS — R69 Illness, unspecified: Secondary | ICD-10-CM | POA: Diagnosis not present

## 2019-06-04 DIAGNOSIS — E1165 Type 2 diabetes mellitus with hyperglycemia: Secondary | ICD-10-CM | POA: Diagnosis not present

## 2019-06-04 DIAGNOSIS — Z7982 Long term (current) use of aspirin: Secondary | ICD-10-CM | POA: Diagnosis not present

## 2019-06-05 DIAGNOSIS — I4891 Unspecified atrial fibrillation: Secondary | ICD-10-CM | POA: Diagnosis not present

## 2019-06-05 DIAGNOSIS — I10 Essential (primary) hypertension: Secondary | ICD-10-CM | POA: Diagnosis not present

## 2019-06-05 DIAGNOSIS — K2100 Gastro-esophageal reflux disease with esophagitis, without bleeding: Secondary | ICD-10-CM | POA: Diagnosis not present

## 2019-06-05 DIAGNOSIS — E1143 Type 2 diabetes mellitus with diabetic autonomic (poly)neuropathy: Secondary | ICD-10-CM | POA: Diagnosis not present

## 2019-06-06 DIAGNOSIS — R443 Hallucinations, unspecified: Secondary | ICD-10-CM | POA: Diagnosis not present

## 2019-06-06 DIAGNOSIS — Z7901 Long term (current) use of anticoagulants: Secondary | ICD-10-CM | POA: Diagnosis not present

## 2019-06-06 DIAGNOSIS — Z794 Long term (current) use of insulin: Secondary | ICD-10-CM | POA: Diagnosis not present

## 2019-06-06 DIAGNOSIS — Z9181 History of falling: Secondary | ICD-10-CM | POA: Diagnosis not present

## 2019-06-06 DIAGNOSIS — Z79899 Other long term (current) drug therapy: Secondary | ICD-10-CM | POA: Diagnosis not present

## 2019-06-06 DIAGNOSIS — Z7982 Long term (current) use of aspirin: Secondary | ICD-10-CM | POA: Diagnosis not present

## 2019-06-06 DIAGNOSIS — E1165 Type 2 diabetes mellitus with hyperglycemia: Secondary | ICD-10-CM | POA: Diagnosis not present

## 2019-06-06 DIAGNOSIS — Z9981 Dependence on supplemental oxygen: Secondary | ICD-10-CM | POA: Diagnosis not present

## 2019-06-06 DIAGNOSIS — R69 Illness, unspecified: Secondary | ICD-10-CM | POA: Diagnosis not present

## 2019-06-08 DIAGNOSIS — Z9181 History of falling: Secondary | ICD-10-CM | POA: Diagnosis not present

## 2019-06-08 DIAGNOSIS — Z7901 Long term (current) use of anticoagulants: Secondary | ICD-10-CM | POA: Diagnosis not present

## 2019-06-08 DIAGNOSIS — R69 Illness, unspecified: Secondary | ICD-10-CM | POA: Diagnosis not present

## 2019-06-08 DIAGNOSIS — Z7982 Long term (current) use of aspirin: Secondary | ICD-10-CM | POA: Diagnosis not present

## 2019-06-08 DIAGNOSIS — Z79899 Other long term (current) drug therapy: Secondary | ICD-10-CM | POA: Diagnosis not present

## 2019-06-08 DIAGNOSIS — R443 Hallucinations, unspecified: Secondary | ICD-10-CM | POA: Diagnosis not present

## 2019-06-08 DIAGNOSIS — E1165 Type 2 diabetes mellitus with hyperglycemia: Secondary | ICD-10-CM | POA: Diagnosis not present

## 2019-06-08 DIAGNOSIS — Z9981 Dependence on supplemental oxygen: Secondary | ICD-10-CM | POA: Diagnosis not present

## 2019-06-08 DIAGNOSIS — Z794 Long term (current) use of insulin: Secondary | ICD-10-CM | POA: Diagnosis not present

## 2019-06-10 ENCOUNTER — Other Ambulatory Visit: Payer: Self-pay | Admitting: Cardiovascular Disease

## 2019-06-11 ENCOUNTER — Encounter: Payer: Self-pay | Admitting: "Endocrinology

## 2019-06-11 ENCOUNTER — Ambulatory Visit (INDEPENDENT_AMBULATORY_CARE_PROVIDER_SITE_OTHER): Payer: Medicare HMO | Admitting: "Endocrinology

## 2019-06-11 DIAGNOSIS — E782 Mixed hyperlipidemia: Secondary | ICD-10-CM | POA: Diagnosis not present

## 2019-06-11 DIAGNOSIS — E1121 Type 2 diabetes mellitus with diabetic nephropathy: Secondary | ICD-10-CM | POA: Diagnosis not present

## 2019-06-11 DIAGNOSIS — I10 Essential (primary) hypertension: Secondary | ICD-10-CM | POA: Diagnosis not present

## 2019-06-11 DIAGNOSIS — N1831 Chronic kidney disease, stage 3a: Secondary | ICD-10-CM | POA: Diagnosis not present

## 2019-06-11 DIAGNOSIS — Z794 Long term (current) use of insulin: Secondary | ICD-10-CM

## 2019-06-11 MED ORDER — SOLIQUA 100-33 UNT-MCG/ML ~~LOC~~ SOPN: 60.0000 [IU] | PEN_INJECTOR | Freq: Every day | SUBCUTANEOUS | 2 refills | Status: DC

## 2019-06-11 MED ORDER — GLIPIZIDE ER 2.5 MG PO TB24: 2.5000 mg | ORAL_TABLET | Freq: Every day | ORAL | 3 refills | Status: AC

## 2019-06-11 NOTE — Patient Instructions (Signed)

## 2019-06-11 NOTE — Progress Notes (Signed)
06/11/2019, 11:51 AM                                                    Endocrinology Telehealth Visit Follow up Note -During COVID -19 Pandemic  This visit type was conducted due to national recommendations for restrictions regarding the COVID-19 Pandemic  in an effort to limit this patient's exposure and mitigate transmission of the corona virus.  Due to his co-morbid illnesses, Noah [name] is at  moderate to high risk for complications without adequate follow up.  This format is felt to be most appropriate for him at this time.  I connected with this patient on 06/11/2019   by telephone and verified that I am speaking with the correct person using two identifiers. Noah [name], [birth year]. he has verbally consented to this visit. All issues noted in this document were discussed and addressed. The format was not optimal for physical exam.    Subjective:    Patient ID: Noah [name], male    DOB: 01/30/1941.  Noah [name] is being engaged in telehealth via telephone after he was seen in consultation for management of currently uncontrolled symptomatic diabetes requested by  Estanislado Pandy, MD.   Past Medical History:  Diagnosis Date  . CAD S/P percutaneous coronary angioplasty    remote PCIs in 1990's- last CFX PCI 2008  . Chronic kidney disease, stage III (moderate)   . Diabetes mellitus with complication (HCC)    with hyperglycemia and diabetic autonomic poly neuropathy  . Gastro-esophageal reflux disease with esophagitis   . Hyperlipidemia   . Hypertension   . Ischemic cardiomyopathy 01/20/2018   St Jude ICD   . Morbid obesity (HCC)   . Obstructive sleep apnea    on CPAP  . Polyneuropathy   . Primary insomnia     Past Surgical History:  Procedure Laterality Date  . BIV ICD INSERTION CRT-D N/A 01/20/2018   Procedure: BIV ICD INSERTION CRT-D;  Surgeon: Marinus Maw, MD;  Location: [facility] INVASIVE CV LAB;  Service:  Cardiovascular;  Laterality: N/A;  . BIV PACEMAKER INSERTION CRT-P  01/20/2018   St Jude- Dr Ladona Ridgel  . CARDIAC CATHETERIZATION     multiple angioplasty X 8, 4 stents     Social History   Socioeconomic History  . Marital status: Married    Spouse name: Not on file  . Number of children: Not on file  . Years of education: Not on file  . Highest education level: Not on file  Occupational History  . Occupation: Retired  Tobacco Use  . Smoking status: Former Smoker    Packs/day: 1.00    Years: 30.00    Pack years: 30.00    Quit date: 09/01/2000    Years since quitting: 18.7  . Smokeless tobacco: Never Used  Substance and Sexual Activity  . Alcohol use: Yes    Alcohol/week: 0.0 standard drinks  . Drug use: No  . Sexual activity: Not on file  Other Topics Concern  . Not on file  Social History Narrative  . Not on file   Social Determinants  of Health   Financial Resource Strain:   . Difficulty of Paying Living Expenses: Not on file  Food Insecurity:   . Worried About Programme researcher, broadcasting/film/video in the Last Year: Not on file  . Ran Out of Food in the Last Year: Not on file  Transportation Needs:   . Lack of Transportation (Medical): Not on file  . Lack of Transportation (Non-Medical): Not on file  Physical Activity:   . Days of Exercise per Week: Not on file  . Minutes of Exercise per Session: Not on file  Stress:   . Feeling of Stress : Not on file  Social Connections:   . Frequency of Communication with Friends and Family: Not on file  . Frequency of Social Gatherings with Friends and Family: Not on file  . Attends Religious Services: Not on file  . Active Member of Clubs or Organizations: Not on file  . Attends Banker Meetings: Not on file  . Marital Status: Not on file    Family History  Problem Relation Age of Onset  . Heart attack Father   . Breast cancer Mother   . CAD Mother   . Cancer Brother     Outpatient Encounter Medications as of 06/11/2019   Medication Sig  . acetaminophen (TYLENOL) 650 MG CR tablet Take 650-1,300 mg by mouth daily as needed for pain.   Marland Kitchen aspirin EC 81 MG tablet Take 1 tablet (81 mg total) by mouth daily with breakfast.  . cetirizine (ZYRTEC) 10 MG tablet Take 10 mg by mouth daily.  . Continuous Blood Gluc Sensor (FREESTYLE LIBRE 14 DAY SENSOR) MISC Inject 1 each into the skin every 14 (fourteen) days. Use as directed.  . finasteride (PROSCAR) 5 MG tablet Take 5 mg by mouth every evening.   Marland Kitchen FLUoxetine (PROZAC) 20 MG capsule Take 20 mg by mouth every evening.  . furosemide (LASIX) 40 MG tablet Take 1 tablet (40 mg total) by mouth daily.  Marland Kitchen gabapentin (NEURONTIN) 100 MG capsule Take 1 capsule (100 mg total) by mouth 2 (two) times daily.  Marland Kitchen glipiZIDE (GLUCOTROL XL) 2.5 MG 24 hr tablet Take 1 tablet (2.5 mg total) by mouth daily with breakfast.  . Insulin Glargine-Lixisenatide (SOLIQUA) 100-33 UNT-MCG/ML SOPN Inject 60 Units into the skin daily with breakfast.  . levETIRAcetam (KEPPRA) 750 MG tablet Take 750 mg by mouth 2 (two) times daily.  . metoprolol succinate (TOPROL XL) 50 MG 24 hr tablet Take 1 tablet (50 mg total) by mouth daily. Take with or immediately following a meal.  . Omega-3 Fatty Acids (FISH OIL) 1000 MG CAPS Take 1,000 mg by mouth 2 (two) times daily.   . pantoprazole (PROTONIX) 40 MG tablet Take 1 tablet (40 mg total) by mouth daily.  . sacubitril-valsartan (ENTRESTO) 24-26 MG Take 1 tablet by mouth 2 (two) times daily.  . simvastatin (ZOCOR) 40 MG tablet Take 40 mg by mouth daily.  . Tamsulosin HCl (FLOMAX) 0.4 MG CAPS Take 0.8 mg by mouth every evening.  . warfarin (COUMADIN) 5 MG tablet TAKE 1/2 TABLET (2.5MG ) DAILY EXCEPT 1 TABLET (5MG ) ON TUESDAYS, THURSDAYS AND SATURDAYS  . Zinc 50 MG TABS Take 200 mg by mouth daily.  . [DISCONTINUED] Insulin Glargine-Lixisenatide (SOLIQUA) 100-33 UNT-MCG/ML SOPN Inject 50 Units into the skin daily with breakfast.   No facility-administered encounter  medications on file as of 06/11/2019.    ALLERGIES: Allergies  Allergen Reactions  . Codeine Other (See Comments)    constipation  .  Erythromycin-Sulfisoxazole Other (See Comments)    Causes infection in throat and eyes    VACCINATION STATUS: Immunization History  Administered Date(s) Administered  . Influenza-Unspecified 02/08/2018    Diabetes He presents for his follow-up diabetic visit. He has type 2 diabetes mellitus. Onset time: He was diagnosed at approximate age of 40 years. His disease course has been fluctuating. There are no hypoglycemic associated symptoms. Pertinent negatives for hypoglycemia include no confusion, headaches, pallor or seizures. Associated symptoms include polydipsia and polyuria. Pertinent negatives for diabetes include no chest pain, no fatigue, no polyphagia and no weakness. There are no hypoglycemic complications. Symptoms are worsening. Diabetic complications include heart disease, nephropathy and peripheral neuropathy. Risk factors for coronary artery disease include diabetes mellitus, dyslipidemia, hypertension, male sex, obesity, sedentary lifestyle, tobacco exposure and family history. Current diabetic treatments: He is currently on Soliqua 30 units nightly, NovoLog sliding scale. His weight is decreasing steadily. He is following a generally unhealthy diet. When asked about meal planning, he reported none. He has had a previous visit with a dietitian. He rarely (Has a very limited exercise capacity due to disequilibrium.) participates in exercise. (He reports his fasting glycemic profile ranges between 141-222 the last 7 days, postprandial blood glucose readings are in 159-269.   No hypoglycemia reported. His most recent A1c was 8.7% in December 2020.   ) An ACE inhibitor/angiotensin II receptor blocker is not being taken. Eye exam is current.  Hyperlipidemia This is a chronic problem. The current episode started more than 1 year ago. The problem is  controlled. Exacerbating diseases include diabetes and obesity. Pertinent negatives include no chest pain, myalgias or shortness of breath. Risk factors for coronary artery disease include diabetes mellitus, dyslipidemia, family history, obesity, male sex, hypertension and a sedentary lifestyle.  Hypertension This is a chronic problem. The current episode started more than 1 year ago. Pertinent negatives include no chest pain, headaches, neck pain, palpitations or shortness of breath. Risk factors for coronary artery disease include dyslipidemia, diabetes mellitus, obesity, male gender, family history, sedentary lifestyle and smoking/tobacco exposure. Hypertensive end-organ damage includes kidney disease and CAD/MI.     Review of Systems  Constitutional: Negative for chills, fatigue, fever and unexpected weight change.  HENT: Negative for dental problem, mouth sores and trouble swallowing.   Eyes: Negative for visual disturbance.  Respiratory: Negative for cough, choking, chest tightness, shortness of breath and wheezing.   Cardiovascular: Negative for chest pain, palpitations and leg swelling.  Gastrointestinal: Negative for abdominal distention, abdominal pain, constipation, diarrhea, nausea and vomiting.  Endocrine: Positive for polydipsia and polyuria. Negative for polyphagia.  Genitourinary: Negative for dysuria, flank pain, hematuria and urgency.  Musculoskeletal: Negative for back pain, gait problem, myalgias and neck pain.  Skin: Negative for pallor, rash and wound.  Neurological: Negative for seizures, syncope, weakness, numbness and headaches.  Psychiatric/Behavioral: Negative for confusion and dysphoric mood.    Objective:    Vitals with BMI 06/01/2019 05/28/2019 04/25/2019  Height 5\' 8"  5\' 8"  -  Weight 270 lbs 10 oz 274 lbs -  BMI 41.15 41.67 -  Systolic 143 139  Diastolic 73 83 67  Pulse 82 74 62    There were no vitals taken for this visit.  Wt Readings from Last 3  Encounters:  06/01/19 270 lb 9.6 oz (122.7 kg)  05/28/19 274 lb (124.3 kg)  04/25/19 282 lb 6.6 oz (128.1 kg)       CMP     Component Value Date/Time   NA  140 04/25/2019 0526   K 4.7 04/25/2019 0526   CL 103 04/25/2019 0526   CO2 29 04/25/2019 0526   GLUCOSE 197 (H) 04/25/2019 0526   BUN 28 (H) 04/25/2019 0526   CREATININE 1.49 (H) 04/25/2019 0526   CALCIUM 8.8 (L) 04/25/2019 0526   PROT 6.2 (L) 04/11/2019 0512   ALBUMIN 2.6 (L) 04/25/2019 0526   AST 14 (L) 04/11/2019 0512   ALT 13 04/11/2019 0512   ALKPHOS 61 04/11/2019 0512   BILITOT 0.8 04/11/2019 0512   GFRNONAA 44 (L) 04/25/2019 0526   GFRAA 51 (L) 04/25/2019 0526     Diabetic Labs (most recent): Lab Results  Component Value Date   HGBA1C 8.7 (H) 03/28/2019   HGBA1C 9.5 (H) 05/19/2018     Lipid Panel ( most recent) Lipid Panel     Component Value Date/Time   CHOL  09/09/2007 0300    121        ATP III CLASSIFICATION:  <200     mg/dL   Desirable  283-151  mg/dL   Borderline High  >=761    mg/dL   High   TRIG 92 60/73/7106 0300   HDL 28 (L) 09/09/2007 0300   CHOLHDL 4.3 09/09/2007 0300   VLDL 18 09/09/2007 0300   LDLCALC  09/09/2007 0300    75        Total Cholesterol/HDL:CHD Risk Coronary Heart Disease Risk Table                     Men   Women  1/2 Average Risk   3.4   3.3     TSH 3.68 on April 10, 2019       Assessment & Plan:   1. Type 2 diabetes mellitus with stage 3a chronic kidney disease, with long-term current use of insulin (HCC) - Noah [name] has currently uncontrolled symptomatic type 2 DM since  79 years of age. Recent labs reviewed.  He reports his fasting glycemic profile ranges between 141-222 the last 7 days, postprandial blood glucose readings are in 159-269.   No hypoglycemia reported. His most recent A1c was 8.7% in December 2020.   - I had a long discussion with him about the progressive nature of diabetes and the pathology behind its complications. -his diabetes  is complicated by coronary artery disease/CHF, CKD, peripheral neuropathy, obesity/sedentary life and he remains at a high risk for more acute and chronic complications which include CAD, CVA, CKD, retinopathy, and neuropathy. These are all discussed in detail with him.  - I have counseled him on diet  and weight management  by adopting a carbohydrate restricted/protein rich diet. Patient is encouraged to switch to  unprocessed or minimally processed   complex starch and increased protein intake (animal or plant source), fruits, and vegetables. -  he is advised to stick to a routine mealtimes to eat 3 meals  a day and avoid unnecessary snacks ( to snack only to correct hypoglycemia).   - he  admits there is a room for improvement in his diet and drink choices. -  Suggestion is made for him to avoid simple carbohydrates  from his diet including Cakes, Sweet Desserts / Pastries, Ice Cream, Soda (diet and regular), Sweet Tea, Candies, Chips, Cookies, Sweet Pastries,  Store Bought Juices, Alcohol in Excess of  1-2 drinks a day, Artificial Sweeteners, Coffee Creamer, and "Sugar-free" Products. This will help patient to have stable blood glucose profile and potentially avoid unintended weight gain.   -  he will be scheduled with Norm Salt, RDN, CDE for diabetes education.  - I have approached him with the following individualized plan to manage  his diabetes and patient agrees:   - he will continue to benefit from simplified treatment regimen.   -He will tolerate higher dose of Soliqua.  He is advised to increase his Soliqua to 60 units every morning with breakfast, associated with monitoring of blood glucose twice a day-daily before breakfast and at bedtime.   - he is encouraged to call clinic for blood glucose levels less than 70 or above 200 mg /dl in the morning. -He is advised to hold to hold NovoLog for now.   -He will benefit from low-dose glipizide.  I discussed and added glipizide 2.5 mg p.o.  daily with breakfast.  This medication will be advanced if tolerated.  - Specific targets for  A1c;  LDL, HDL,  and Triglycerides were discussed with the patient.  2) Blood Pressure /Hypertension:  he is advised to home monitor blood pressure and report if > 140/90 on 2 separate readings.   he is advised to continue his current medications including metoprolol 50 mg daily and Lasix as needed.  He will be considered for low-dose ACE inhibitor if he is found to have significantly above target blood pressure at next visit.   3) Lipids/Hyperlipidemia:   Review of his recent lipid panel showed  controlled  LDL at 75 .  he  is advised to continue    simvastatin 40 mg daily at bedtime.  Side effects and precautions discussed with him.  4)  Weight/Diet: His BMI is 41-   clearly complicating his diabetes care.   he is  a candidate for modest weight loss. I discussed with him the fact that loss of 5 - 10% of his  current body weight will have the most impact on his diabetes management.  Exercise, and detailed carbohydrates information provided  -  detailed on discharge instructions.  5) Chronic Care/Health Maintenance:  -he  is on and Statin medications and  is encouraged to initiate and continue to follow up with Ophthalmology, Dentist,  Podiatrist at least yearly or according to recommendations, and advised to   stay away from smoking. I have recommended yearly flu vaccine and pneumonia vaccine at least every 5 years; moderate intensity exercise for up to 150 minutes weekly; and  sleep for at least 7 hours a day.  - he is  advised to maintain close follow up with Sasser, Clarene Critchley, MD for primary care needs, as well as his other providers for optimal and coordinated care.   - Time spent on this patient care encounter:  35 min, of which >50% was spent in  counseling and the rest reviewing his  current and  previous labs/studies ( including abstraction from other facilities),  previous treatments, his blood  glucose readings, and medications' doses and developing a plan for long-term care based on the latest recommendations for standards of care; and documenting his care.  Clare Charon Surrette participated in the discussions, expressed understanding, and voiced agreement with the above plans.  All questions were answered to his satisfaction. he is encouraged to contact clinic should he have any questions or concerns prior to his return visit.   Follow up plan: - Return in about 9 weeks (around 08/13/2019) for Bring Meter and Logs- A1c in Office, Include 8 log sheets.  Marquis Lunch, MD [facility] [address] [place],  [place] [address] Phone: [phone]  Fax: [phone]    06/11/2019, 11:51 AM  This note was partially dictated with voice recognition software. Similar sounding words can be transcribed inadequately or may not  be corrected upon review.

## 2019-06-12 DIAGNOSIS — I4891 Unspecified atrial fibrillation: Secondary | ICD-10-CM | POA: Diagnosis not present

## 2019-06-14 ENCOUNTER — Ambulatory Visit: Payer: Self-pay | Admitting: *Deleted

## 2019-06-14 ENCOUNTER — Ambulatory Visit (INDEPENDENT_AMBULATORY_CARE_PROVIDER_SITE_OTHER): Payer: Medicare HMO | Admitting: Clinical

## 2019-06-14 ENCOUNTER — Other Ambulatory Visit: Payer: Self-pay

## 2019-06-14 DIAGNOSIS — R69 Illness, unspecified: Secondary | ICD-10-CM | POA: Diagnosis not present

## 2019-06-14 DIAGNOSIS — W19XXXA Unspecified fall, initial encounter: Secondary | ICD-10-CM | POA: Diagnosis not present

## 2019-06-14 DIAGNOSIS — R5381 Other malaise: Secondary | ICD-10-CM | POA: Diagnosis not present

## 2019-06-14 DIAGNOSIS — F331 Major depressive disorder, recurrent, moderate: Secondary | ICD-10-CM | POA: Diagnosis not present

## 2019-06-14 NOTE — Progress Notes (Signed)
Virtual Visit via Telephone Note  I connected with Noah [name] on 06/14/19 at  2:00 PM EST by telephone and verified that I am speaking with the correct person using two identifiers.  Location: Patient: Home Provider: Office   I discussed the limitations, risks, security and privacy concerns of performing an evaluation and management service by telephone and the availability of in person appointments. I also discussed with the patient that there may be a patient responsible charge related to this service. The patient expressed understanding and agreed to proceed.            THERAPIST PROGRESS NOTE  Session Time: 2:00PM-2:40PM  Participation Level: Active  Behavioral Response: CasualAlertDepressed  Type of Therapy: Individual Therapy  Treatment Goals addressed: Diagnosis: Depression  Interventions: CBT and Supportive  Summary: Noah [name] is a 79 y.o. male who presents with Depression. The OPT therapist worked with the patient for his initial session. The OPT therapist utilized Motivational Interviewing to assist in creating therapeutic repore. The patient in the session was engaged and work in collaboration giving feedback about his triggers and symptoms over the past few weeks including health problems and marital discord. The OPT therapist utilized Cognitive Behavioral Therapy through cognitive restructuring as well as worked with the patient on coping strategies to assist in management of mood.  Suicidal/Homicidal: Nowithout intent/plan  Therapist Response: The patient was engaged in his session and gave feedback in relation to triggers, symptoms, and behavior responses over the past few weeks. The OPT therapist worked with the patient utilizing an in session Cognitive Behavioral Therapy exercise. The patient was responsive in the session and verbalized, " I am willing to work on being more active and working together with my wife, I have been trying to think about her  point of view and I realize at times I can be not a lot of fun to be around".  The OPT therapist worked with the patient on his interaction and communication with his partner and continuing to think more empathetically.The OPT therapist will continue treatment work with the patient in his next scheduled session.  Plan: Return again in 3 weeks.  Diagnosis: Axis I: Major depressive disorder, recurrent episode, moderate    Axis II: No diagnosis  I discussed the assessment and treatment plan with the patient. The patient was provided an opportunity to ask questions and all were answered. The patient agreed with the plan and demonstrated an understanding of the instructions.   The patient was advised to call back or seek an in-person evaluation if the symptoms worsen or if the condition fails to improve as anticipated.  I provided 40 minutes of non-face-to-face time during this encounter.  Winfred Burn, LCSW 06/14/2019

## 2019-06-15 DIAGNOSIS — I5023 Acute on chronic systolic (congestive) heart failure: Secondary | ICD-10-CM | POA: Diagnosis not present

## 2019-06-15 DIAGNOSIS — E1122 Type 2 diabetes mellitus with diabetic chronic kidney disease: Secondary | ICD-10-CM | POA: Diagnosis not present

## 2019-06-15 DIAGNOSIS — R410 Disorientation, unspecified: Secondary | ICD-10-CM | POA: Diagnosis not present

## 2019-06-15 DIAGNOSIS — I10 Essential (primary) hypertension: Secondary | ICD-10-CM | POA: Diagnosis not present

## 2019-06-15 DIAGNOSIS — G9340 Encephalopathy, unspecified: Secondary | ICD-10-CM | POA: Diagnosis not present

## 2019-06-15 DIAGNOSIS — I4891 Unspecified atrial fibrillation: Secondary | ICD-10-CM | POA: Diagnosis not present

## 2019-06-22 ENCOUNTER — Telehealth: Payer: Self-pay

## 2019-06-22 DIAGNOSIS — I4891 Unspecified atrial fibrillation: Secondary | ICD-10-CM | POA: Diagnosis not present

## 2019-06-22 NOTE — Telephone Encounter (Signed)
If no plan to convert to SR, need to determine if can turn off AF alerts.

## 2019-06-22 NOTE — Telephone Encounter (Signed)
Called and spoke with patient, he is aware of appt 06/25/19 @2  pm with , PA.

## 2019-06-22 NOTE — Telephone Encounter (Signed)
Pt returned phone call.  States he was aware of being out Rhythm as he has had intermittent feeling of being disoriented, some trouble walking.  At time of call pt was alert, oriented and asymptomatic.  Pt indicates he has attended AFIB clinic in the past and is willing to again.  Advised would forward to AF clinic and he might hear from them to schedule an appt.

## 2019-06-22 NOTE — Telephone Encounter (Signed)
DPR on file, ok to leave VM.  Left message for pt requesting he callback to discuss ongoing AF.  Pt has known history of AF, on Metoprolol 50mg  daily.  Pt is on OAC- Coumadin.

## 2019-06-23 ENCOUNTER — Encounter (HOSPITAL_COMMUNITY): Payer: Self-pay | Admitting: Emergency Medicine

## 2019-06-23 ENCOUNTER — Emergency Department (HOSPITAL_COMMUNITY): Payer: Medicare HMO

## 2019-06-23 ENCOUNTER — Inpatient Hospital Stay (HOSPITAL_COMMUNITY)
Admission: EM | Admit: 2019-06-23 | Discharge: 2019-07-06 | DRG: 871 | Disposition: A | Discharge status: Skilled Nursing Facility | Payer: Medicare HMO | Attending: Student | Admitting: Student

## 2019-06-23 ENCOUNTER — Other Ambulatory Visit: Payer: Self-pay

## 2019-06-23 DIAGNOSIS — D689 Coagulation defect, unspecified: Secondary | ICD-10-CM | POA: Diagnosis not present

## 2019-06-23 DIAGNOSIS — R0600 Dyspnea, unspecified: Secondary | ICD-10-CM

## 2019-06-23 DIAGNOSIS — N1831 Chronic kidney disease, stage 3a: Secondary | ICD-10-CM | POA: Diagnosis present

## 2019-06-23 DIAGNOSIS — E11649 Type 2 diabetes mellitus with hypoglycemia without coma: Secondary | ICD-10-CM | POA: Diagnosis not present

## 2019-06-23 DIAGNOSIS — R0602 Shortness of breath: Secondary | ICD-10-CM

## 2019-06-23 DIAGNOSIS — I959 Hypotension, unspecified: Secondary | ICD-10-CM

## 2019-06-23 DIAGNOSIS — E870 Hyperosmolality and hypernatremia: Secondary | ICD-10-CM

## 2019-06-23 DIAGNOSIS — N179 Acute kidney failure, unspecified: Secondary | ICD-10-CM

## 2019-06-23 DIAGNOSIS — I484 Atypical atrial flutter: Secondary | ICD-10-CM

## 2019-06-23 DIAGNOSIS — R680 Hypothermia, not associated with low environmental temperature: Secondary | ICD-10-CM | POA: Diagnosis present

## 2019-06-23 DIAGNOSIS — A4181 Sepsis due to Enterococcus: Principal | ICD-10-CM | POA: Diagnosis present

## 2019-06-23 DIAGNOSIS — I480 Paroxysmal atrial fibrillation: Secondary | ICD-10-CM

## 2019-06-23 DIAGNOSIS — I130 Hypertensive heart and chronic kidney disease with heart failure and stage 1 through stage 4 chronic kidney disease, or unspecified chronic kidney disease: Secondary | ICD-10-CM | POA: Diagnosis present

## 2019-06-23 DIAGNOSIS — E872 Acidosis: Secondary | ICD-10-CM | POA: Diagnosis present

## 2019-06-23 DIAGNOSIS — G4733 Obstructive sleep apnea (adult) (pediatric): Secondary | ICD-10-CM | POA: Diagnosis not present

## 2019-06-23 DIAGNOSIS — I2510 Atherosclerotic heart disease of native coronary artery without angina pectoris: Secondary | ICD-10-CM | POA: Diagnosis present

## 2019-06-23 DIAGNOSIS — Z9861 Coronary angioplasty status: Secondary | ICD-10-CM

## 2019-06-23 DIAGNOSIS — E785 Hyperlipidemia, unspecified: Secondary | ICD-10-CM | POA: Diagnosis not present

## 2019-06-23 DIAGNOSIS — F5101 Primary insomnia: Secondary | ICD-10-CM | POA: Diagnosis not present

## 2019-06-23 DIAGNOSIS — R0689 Other abnormalities of breathing: Secondary | ICD-10-CM | POA: Diagnosis not present

## 2019-06-23 DIAGNOSIS — I255 Ischemic cardiomyopathy: Secondary | ICD-10-CM | POA: Diagnosis not present

## 2019-06-23 DIAGNOSIS — G9341 Metabolic encephalopathy: Secondary | ICD-10-CM | POA: Diagnosis present

## 2019-06-23 DIAGNOSIS — Z7901 Long term (current) use of anticoagulants: Secondary | ICD-10-CM

## 2019-06-23 DIAGNOSIS — Z7982 Long term (current) use of aspirin: Secondary | ICD-10-CM

## 2019-06-23 DIAGNOSIS — R6521 Severe sepsis with septic shock: Secondary | ICD-10-CM | POA: Diagnosis present

## 2019-06-23 DIAGNOSIS — Z978 Presence of other specified devices: Secondary | ICD-10-CM

## 2019-06-23 DIAGNOSIS — Z9889 Other specified postprocedural states: Secondary | ICD-10-CM

## 2019-06-23 DIAGNOSIS — I252 Old myocardial infarction: Secondary | ICD-10-CM

## 2019-06-23 DIAGNOSIS — Z781 Physical restraint status: Secondary | ICD-10-CM

## 2019-06-23 DIAGNOSIS — Z885 Allergy status to narcotic agent status: Secondary | ICD-10-CM

## 2019-06-23 DIAGNOSIS — Z0189 Encounter for other specified special examinations: Secondary | ICD-10-CM

## 2019-06-23 DIAGNOSIS — Z8249 Family history of ischemic heart disease and other diseases of the circulatory system: Secondary | ICD-10-CM

## 2019-06-23 DIAGNOSIS — Z9981 Dependence on supplemental oxygen: Secondary | ICD-10-CM

## 2019-06-23 DIAGNOSIS — N1832 Chronic kidney disease, stage 3b: Secondary | ICD-10-CM | POA: Diagnosis present

## 2019-06-23 DIAGNOSIS — I5043 Acute on chronic combined systolic (congestive) and diastolic (congestive) heart failure: Secondary | ICD-10-CM | POA: Diagnosis present

## 2019-06-23 DIAGNOSIS — J9622 Acute and chronic respiratory failure with hypercapnia: Secondary | ICD-10-CM | POA: Diagnosis not present

## 2019-06-23 DIAGNOSIS — R400 Somnolence: Secondary | ICD-10-CM

## 2019-06-23 DIAGNOSIS — D696 Thrombocytopenia, unspecified: Secondary | ICD-10-CM | POA: Diagnosis not present

## 2019-06-23 DIAGNOSIS — R531 Weakness: Secondary | ICD-10-CM | POA: Diagnosis not present

## 2019-06-23 DIAGNOSIS — Z6841 Body Mass Index (BMI) 40.0 and over, adult: Secondary | ICD-10-CM

## 2019-06-23 DIAGNOSIS — Z20822 Contact with and (suspected) exposure to covid-19: Secondary | ICD-10-CM | POA: Diagnosis not present

## 2019-06-23 DIAGNOSIS — Z794 Long term (current) use of insulin: Secondary | ICD-10-CM

## 2019-06-23 DIAGNOSIS — I4821 Permanent atrial fibrillation: Secondary | ICD-10-CM | POA: Diagnosis not present

## 2019-06-23 DIAGNOSIS — E1122 Type 2 diabetes mellitus with diabetic chronic kidney disease: Secondary | ICD-10-CM | POA: Diagnosis not present

## 2019-06-23 DIAGNOSIS — N390 Urinary tract infection, site not specified: Secondary | ICD-10-CM | POA: Diagnosis not present

## 2019-06-23 DIAGNOSIS — Z87891 Personal history of nicotine dependence: Secondary | ICD-10-CM

## 2019-06-23 DIAGNOSIS — E860 Dehydration: Secondary | ICD-10-CM | POA: Diagnosis not present

## 2019-06-23 DIAGNOSIS — E2740 Unspecified adrenocortical insufficiency: Secondary | ICD-10-CM | POA: Diagnosis present

## 2019-06-23 DIAGNOSIS — R069 Unspecified abnormalities of breathing: Secondary | ICD-10-CM

## 2019-06-23 DIAGNOSIS — D631 Anemia in chronic kidney disease: Secondary | ICD-10-CM | POA: Diagnosis present

## 2019-06-23 DIAGNOSIS — Z888 Allergy status to other drugs, medicaments and biological substances status: Secondary | ICD-10-CM

## 2019-06-23 DIAGNOSIS — J810 Acute pulmonary edema: Secondary | ICD-10-CM

## 2019-06-23 DIAGNOSIS — E871 Hypo-osmolality and hyponatremia: Secondary | ICD-10-CM | POA: Diagnosis not present

## 2019-06-23 DIAGNOSIS — R918 Other nonspecific abnormal finding of lung field: Secondary | ICD-10-CM | POA: Diagnosis not present

## 2019-06-23 DIAGNOSIS — J9601 Acute respiratory failure with hypoxia: Secondary | ICD-10-CM

## 2019-06-23 DIAGNOSIS — R1310 Dysphagia, unspecified: Secondary | ICD-10-CM

## 2019-06-23 DIAGNOSIS — T68XXXA Hypothermia, initial encounter: Secondary | ICD-10-CM | POA: Diagnosis not present

## 2019-06-23 DIAGNOSIS — I4891 Unspecified atrial fibrillation: Secondary | ICD-10-CM | POA: Diagnosis not present

## 2019-06-23 DIAGNOSIS — R569 Unspecified convulsions: Secondary | ICD-10-CM | POA: Diagnosis present

## 2019-06-23 DIAGNOSIS — J811 Chronic pulmonary edema: Secondary | ICD-10-CM | POA: Diagnosis present

## 2019-06-23 DIAGNOSIS — K219 Gastro-esophageal reflux disease without esophagitis: Secondary | ICD-10-CM | POA: Diagnosis present

## 2019-06-23 DIAGNOSIS — Z79899 Other long term (current) drug therapy: Secondary | ICD-10-CM

## 2019-06-23 DIAGNOSIS — Z95810 Presence of automatic (implantable) cardiac defibrillator: Secondary | ICD-10-CM

## 2019-06-23 LAB — URINALYSIS, ROUTINE W REFLEX MICROSCOPIC
Bilirubin Urine: NEGATIVE
Glucose, UA: NEGATIVE mg/dL
Hgb urine dipstick: NEGATIVE
Ketones, ur: NEGATIVE mg/dL
Leukocytes,Ua: NEGATIVE
Nitrite: NEGATIVE
Protein, ur: 30 mg/dL — AB
Specific Gravity, Urine: 1.015 (ref 1.005–1.030)
pH: 5 (ref 5.0–8.0)

## 2019-06-23 LAB — CBC WITH DIFFERENTIAL/PLATELET
Abs Immature Granulocytes: 0.04 10*3/uL (ref 0.00–0.07)
Basophils Absolute: 0 10*3/uL (ref 0.0–0.1)
Basophils Relative: 0 %
Eosinophils Absolute: 0.1 10*3/uL (ref 0.0–0.5)
Eosinophils Relative: 1 %
HCT: 37.5 % — ABNORMAL LOW (ref 39.0–52.0)
Hemoglobin: 11.2 g/dL — ABNORMAL LOW (ref 13.0–17.0)
Immature Granulocytes: 0 %
Lymphocytes Relative: 9 %
Lymphs Abs: 0.9 10*3/uL (ref 0.7–4.0)
MCH: 27.8 pg (ref 26.0–34.0)
MCHC: 29.9 g/dL — ABNORMAL LOW (ref 30.0–36.0)
MCV: 93.1 fL (ref 80.0–100.0)
Monocytes Absolute: 0.5 10*3/uL (ref 0.1–1.0)
Monocytes Relative: 5 %
Neutro Abs: 8.2 10*3/uL — ABNORMAL HIGH (ref 1.7–7.7)
Neutrophils Relative %: 85 %
Platelets: 86 10*3/uL — ABNORMAL LOW (ref 150–400)
RBC: 4.03 MIL/uL — ABNORMAL LOW (ref 4.22–5.81)
RDW: 13.8 % (ref 11.5–15.5)
WBC: 9.7 10*3/uL (ref 4.0–10.5)
nRBC: 0 % (ref 0.0–0.2)

## 2019-06-23 LAB — BASIC METABOLIC PANEL
Anion gap: 6 (ref 5–15)
BUN: 66 mg/dL — ABNORMAL HIGH (ref 8–23)
CO2: 24 mmol/L (ref 22–32)
Calcium: 8.6 mg/dL — ABNORMAL LOW (ref 8.9–10.3)
Chloride: 111 mmol/L (ref 98–111)
Creatinine, Ser: 1.65 mg/dL — ABNORMAL HIGH (ref 0.61–1.24)
GFR calc Af Amer: 45 mL/min — ABNORMAL LOW (ref >60)
GFR calc non Af Amer: 39 mL/min — ABNORMAL LOW (ref >60)
Glucose, Bld: 159 mg/dL — ABNORMAL HIGH (ref 70–99)
Potassium: 5.1 mmol/L (ref 3.5–5.1)
Sodium: 141 mmol/L (ref 135–145)

## 2019-06-23 LAB — APTT: aPTT: 48 seconds — ABNORMAL HIGH (ref 24–36)

## 2019-06-23 LAB — HEPATIC FUNCTION PANEL
ALT: 54 U/L — ABNORMAL HIGH (ref 0–44)
AST: 39 U/L (ref 15–41)
Albumin: 3 g/dL — ABNORMAL LOW (ref 3.5–5.0)
Alkaline Phosphatase: 101 U/L (ref 38–126)
Bilirubin, Direct: <0.1 mg/dL (ref 0.0–0.2)
Total Bilirubin: 0.6 mg/dL (ref 0.3–1.2)
Total Protein: 6 g/dL — ABNORMAL LOW (ref 6.5–8.1)

## 2019-06-23 LAB — LACTIC ACID, PLASMA
Lactic Acid, Venous: 0.7 mmol/L (ref 0.5–1.9)
Lactic Acid, Venous: 0.6 mmol/L (ref 0.5–1.9)
Lactic Acid, Venous: 0.7 mmol/L (ref 0.5–1.9)

## 2019-06-23 LAB — TROPONIN I (HIGH SENSITIVITY)
Troponin I (High Sensitivity): 6 ng/L (ref <18)
Troponin I (High Sensitivity): 6 ng/L (ref <18)

## 2019-06-23 LAB — PROTIME-INR
INR: 1.2 (ref 0.8–1.2)
Prothrombin Time: 15.4 seconds — ABNORMAL HIGH (ref 11.4–15.2)

## 2019-06-23 LAB — BRAIN NATRIURETIC PEPTIDE: B Natriuretic Peptide: 374 pg/mL — ABNORMAL HIGH (ref 0.0–100.0)

## 2019-06-23 LAB — RESPIRATORY PANEL BY RT PCR (FLU A&B, COVID)
Influenza A by PCR: NEGATIVE
Influenza B by PCR: NEGATIVE
SARS Coronavirus 2 by RT PCR: NEGATIVE

## 2019-06-23 LAB — D-DIMER, QUANTITATIVE: D-Dimer, Quant: 0.39 ug/mL-FEU (ref 0.00–0.50)

## 2019-06-23 MED ORDER — VANCOMYCIN HCL 1500 MG/300ML IV SOLN
1500.0000 mg | INTRAVENOUS | Status: DC
  Administered 2019-06-24: 1500 mg via INTRAVENOUS
  Filled 2019-06-23: qty 300

## 2019-06-23 MED ORDER — METRONIDAZOLE IN NACL 5-0.79 MG/ML-% IV SOLN
500.0000 mg | Freq: Once | INTRAVENOUS | Status: AC
  Administered 2019-06-23: 500 mg via INTRAVENOUS
  Filled 2019-06-23: qty 100

## 2019-06-23 MED ORDER — SODIUM CHLORIDE 0.9 % IV SOLN
2.0000 g | Freq: Three times a day (TID) | INTRAVENOUS | Status: DC
  Administered 2019-06-24 (×2): 2 g via INTRAVENOUS
  Filled 2019-06-23 (×3): qty 2

## 2019-06-23 MED ORDER — SODIUM CHLORIDE 0.9 % IV SOLN
2.0000 g | Freq: Once | INTRAVENOUS | Status: AC
  Administered 2019-06-23: 2 g via INTRAVENOUS
  Filled 2019-06-23: qty 2

## 2019-06-23 MED ORDER — SODIUM CHLORIDE 0.9 % IV BOLUS (SEPSIS): 1000.0000 mL | Freq: Once | INTRAVENOUS | Status: DC

## 2019-06-23 MED ORDER — NOREPINEPHRINE 4 MG/250ML-% IV SOLN
0.0000 ug/min | INTRAVENOUS | Status: DC
  Administered 2019-06-23: 2 ug/min via INTRAVENOUS
  Administered 2019-06-24: 10 ug/min via INTRAVENOUS
  Administered 2019-06-24: 7 ug/min via INTRAVENOUS
  Administered 2019-06-24: 14 ug/min via INTRAVENOUS
  Administered 2019-06-25: 7 ug/min via INTRAVENOUS
  Filled 2019-06-23 (×5): qty 250

## 2019-06-23 MED ORDER — SODIUM CHLORIDE 0.9 % IV BOLUS (SEPSIS)
1000.0000 mL | Freq: Once | INTRAVENOUS | Status: AC
  Administered 2019-06-23: 1000 mL via INTRAVENOUS

## 2019-06-23 MED ORDER — VANCOMYCIN HCL IN DEXTROSE 1-5 GM/200ML-% IV SOLN: 1000.0000 mg | Freq: Once | INTRAVENOUS | Status: DC

## 2019-06-23 MED ORDER — VANCOMYCIN HCL 2000 MG/400ML IV SOLN
2000.0000 mg | Freq: Once | INTRAVENOUS | Status: AC
  Administered 2019-06-23: 2000 mg via INTRAVENOUS
  Filled 2019-06-23: qty 400

## 2019-06-23 NOTE — Progress Notes (Signed)
Per RN, lab was able to collect blood culture x 1 and lactate

## 2019-06-23 NOTE — ED Triage Notes (Signed)
Pt reports for the past few days he has been more sob than usual. Wears O2 prn and has been using it more often. Audible wheezing noted, no edema, no pain.

## 2019-06-23 NOTE — Progress Notes (Signed)
Pharmacy Antibiotic Note  Noah [name] is a 79 y.o. male admitted on 06/23/2019 with unknown source.  Pharmacy has been consulted for Vancomycin and cefepime dosing.  Plan: Vancomycin 2000mg  loading dose, then 1500mg  IV every 24 hours.  Goal trough 15-20 mcg/mL.  Cefepime 2gm IV q8h F/U cxs and clinical progress Monitor V/S, labs, and levels as indicated  Height: 5\' 8"  (172.7 cm) Weight: 272 lb (123.4 kg) IBW/kg (Calculated) : 68.4  Temp (24hrs), Avg:91.1 F (32.8 C), Min:91.1 F (32.8 C), Max:91.1 F (32.8 C)  Recent Labs  Lab 06/23/19 1552  WBC 9.7  CREATININE 1.65*    Estimated Creatinine Clearance: 47.2 mL/min (A) (by C-G formula based on SCr of 1.65 mg/dL (H)).    Allergies  Allergen Reactions  . Codeine Other (See Comments)    constipation  . Erythromycin-Sulfisoxazole Other (See Comments)    Causes infection in throat and eyes    Antimicrobials this admission: Vancomycin 3/6 >>  Cefepime 3/6 >>   Dose adjustments this admission: prn  Microbiology results: 3/6 BCx: pending 3/6    MRSA PCR:   Thank you for allowing pharmacy to be a part of this patient's care.  , BS Pharm D, 08/23/19 Clinical Pharmacist Pager [phone] 06/23/2019 6:02 PM

## 2019-06-23 NOTE — ED Provider Notes (Signed)
[facility] EMERGENCY DEPARTMENT Provider Note   CSN: [id number] Arrival date & time: 06/23/19  1523     History Chief Complaint  Patient presents with  . Shortness of Breath    Noah [name] is a 79 y.o. male.  Patient brought in by EMS for shortness of breath.  Patient supposed to be on 2 L of oxygen at all times.  Facility does that as needed.  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  Some renal insufficiency.  Diabetes hypertension.  Recently admitted in December for acute encephalopathy.  The patient is alert no complaints of any pain states that he feels short of breath states that he is a [facility] in years 2021.  So he seems to be mentating fine here today.  Initial vital signs he was not febrile he was actually hypothermic.  Patient supposed to be on Coumadin.  Patient has ischemic cardiomyopathy he does have a pacemaker and does have a defibrillator in place.  He states that has not fired.  When he arrived his temperature was 91.1.  Blood pressure was 107/63 respirations were 16 and heart rate was a paced rhythm at 70.  Once he was placed on 2 L of oxygen his oxygen sats were 96%.  Patient did not appear to be in any respiratory distress.  Patient also stated that he has had both Covid vaccines he received a second vaccine 2 weeks ago.        Past Medical History:  Diagnosis Date  . CAD S/P percutaneous coronary angioplasty    remote PCIs in 1990's- last CFX PCI 2008  . Chronic kidney disease, stage III (moderate)   . Diabetes mellitus with complication (HCC)    with hyperglycemia and diabetic autonomic poly neuropathy  . Gastro-esophageal reflux disease with esophagitis   . Hyperlipidemia   . Hypertension   . Ischemic cardiomyopathy 01/20/2018   St Jude ICD   . Morbid obesity (HCC)   . Obstructive sleep apnea    on CPAP  . Polyneuropathy   . Primary insomnia     Patient Active Problem  List   Diagnosis Date Noted  . Essential hypertension, benign 06/01/2019  . Epilepsy (HCC) 04/11/2019  . Acute encephalopathy 04/10/2019  . Acute on chronic combined systolic and diastolic congestive heart failure (HCC) 03/29/2019  . Acute exacerbation of CHF (congestive heart failure) (HCC) 03/28/2019  . Fall at home, initial encounter 05/18/2018  . CKD (chronic kidney disease) stage 3, GFR 30-59 ml/min 05/18/2018  . OSA (obstructive sleep apnea) 05/18/2018  . 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 CAD S/P percutaneous coronary angioplasty 01/21/2018  . Chronic systolic (congestive) heart failure (HCC) 01/20/2018  . Type 2 diabetes mellitus with stage 3a chronic kidney disease, with long-term current use of insulin (HCC) 06/08/2012  . Mixed hyperlipidemia 01/16/2009  . OVERWEIGHT/OBESITY 01/16/2009  . SOB (shortness of breath) 01/16/2009    Past Surgical History:  Procedure Laterality Date  . BIV ICD INSERTION CRT-D N/A 01/20/2018   Procedure: BIV ICD INSERTION CRT-D;  Surgeon: Marinus Maw, MD;  Location: [facility] INVASIVE CV LAB;  Service: Cardiovascular;  Laterality: N/A;  . BIV PACEMAKER INSERTION CRT-P  01/20/2018   St Jude- Dr Ladona Ridgel  . CARDIAC CATHETERIZATION     multiple angioplasty X 8, 4 stents        Family History  Problem Relation Age of Onset  . Heart attack Father   . Breast cancer  Mother   . CAD Mother   . Cancer Brother     Social History   Tobacco Use  . Smoking status: Former Smoker    Packs/day: 1.00    Years: 30.00    Pack years: 30.00    Quit date: 09/01/2000    Years since quitting: 18.8  . Smokeless tobacco: Never Used  Substance Use Topics  . Alcohol use: Yes    Alcohol/week: 0.0 standard drinks  . Drug use: No    Home Medications Prior to Admission medications   Medication Sig Start Date End Date Taking? Authorizing Provider  acetaminophen (TYLENOL) 650 MG CR tablet Take 650-1,300 mg by mouth daily as needed for  pain.    Yes [provider]  aspirin EC 81 MG tablet Take 1 tablet (81 mg total) by mouth daily with breakfast. 04/25/19  Yes Emokpae, Courage, MD  cetirizine (ZYRTEC) 10 MG tablet Take 10 mg by mouth daily.   Yes [provider]  Continuous Blood Gluc Sensor (FREESTYLE LIBRE 14 DAY SENSOR) MISC Inject 1 each into the skin every 14 (fourteen) days. Use as directed. 06/01/19  Yes Nida, Denman George, MD  ENTRESTO 49-51 MG Take 1 tablet by mouth 2 (two) times daily. 06/15/19  Yes [provider]  finasteride (PROSCAR) 5 MG tablet Take 5 mg by mouth every evening.  08/13/17  Yes [provider]  FLUoxetine (PROZAC) 20 MG capsule Take 20 mg by mouth every evening.   Yes [provider]  furosemide (LASIX) 40 MG tablet Take 1 tablet (40 mg total) by mouth daily. 04/26/19  Yes Emokpae, Courage, MD  gabapentin (NEURONTIN) 100 MG capsule Take 1 capsule (100 mg total) by mouth 2 (two) times daily. 04/25/19  Yes Emokpae, Courage, MD  glipiZIDE (GLUCOTROL XL) 2.5 MG 24 hr tablet Take 1 tablet (2.5 mg total) by mouth daily with breakfast. 06/11/19  Yes Nida, Denman George, MD  Insulin Glargine-Lixisenatide (SOLIQUA) 100-33 UNT-MCG/ML SOPN Inject 60 Units into the skin daily with breakfast. 06/11/19  Yes Nida, Denman George, MD  levETIRAcetam (KEPPRA) 750 MG tablet Take 750 mg by mouth 2 (two) times daily. 07/10/18  Yes [provider]  metoprolol succinate (TOPROL XL) 50 MG 24 hr tablet Take 1 tablet (50 mg total) by mouth daily. Take with or immediately following a meal. 04/25/19 04/24/20 Yes Emokpae, Courage, MD  Omega-3 Fatty Acids (FISH OIL) 1000 MG CAPS Take 1,000 mg by mouth 2 (two) times daily.    Yes [provider]  pantoprazole (PROTONIX) 40 MG tablet Take 1 tablet (40 mg total) by mouth daily. 04/26/19  Yes Emokpae, Courage, MD  Tamsulosin HCl (FLOMAX) 0.4 MG CAPS Take 0.8 mg by mouth every evening.   Yes [provider]  warfarin (COUMADIN)  5 MG tablet TAKE 1/2 TABLET (2.5MG ) DAILY EXCEPT 1 TABLET ( ) ON TUESDAYS, THURSDAYS AND SATURDAYS 06/11/19  Yes Laqueta Linden, MD  Zinc 50 MG TABS Take 200 mg by mouth daily.   Yes [provider]    Allergies    Codeine and Erythromycin-sulfisoxazole  Review of Systems   Review of Systems  Constitutional: Negative for chills and fever.  HENT: Negative for congestion, rhinorrhea and sore throat.   Eyes: Negative for visual disturbance.  Respiratory: Positive for shortness of breath. Negative for cough.   Cardiovascular: Negative for chest pain and leg swelling.  Gastrointestinal: Negative for abdominal pain, diarrhea, nausea and vomiting.  Genitourinary: Negative for dysuria.  Musculoskeletal: Negative for back pain  and neck pain.  Skin: Negative for rash.  Neurological: Negative for dizziness, light-headedness and headaches.  Hematological: Does not bruise/bleed easily.  Psychiatric/Behavioral: Negative for confusion.    Physical Exam Updated Vital Signs BP (!) 107/48   Pulse 70   Temp (!) 91.4 F (33 C)   Resp 17   Ht 1.727 m (5\' 8" )   Wt 123.4 kg   SpO2 100%   BMI 41.36 kg/m   Physical Exam Vitals and nursing note reviewed.  Constitutional:      General: He is not in acute distress.    Appearance: Normal appearance. He is well-developed.     Comments: Cool to touch.  HENT:     Head: Normocephalic and atraumatic.  Eyes:     Extraocular Movements: Extraocular movements intact.     Conjunctiva/sclera: Conjunctivae normal.     Pupils: Pupils are equal, round, and reactive to light.  Cardiovascular:     Rate and Rhythm: Normal rate and regular rhythm.     Heart sounds: No murmur.  Pulmonary:     Effort: Pulmonary effort is normal. No respiratory distress.     Breath sounds: Normal breath sounds. No wheezing or rales.  Abdominal:     Palpations: Abdomen is soft.     Tenderness: There is no abdominal tenderness.  Musculoskeletal:     Cervical  back: Neck supple.     Right lower leg: Edema present.     Left lower leg: Edema present.  Skin:    General: Skin is warm and dry.  Neurological:     Mental Status: He is alert and oriented to person, place, and time.     Cranial Nerves: No cranial nerve deficit.     Motor: No weakness.     ED Results / Procedures / Treatments   Labs (all labs ordered are listed, but only abnormal results are displayed) Labs Reviewed  CBC WITH DIFFERENTIAL/PLATELET - Abnormal; Notable for the following components:      Result Value   RBC 4.03 (*)    Hemoglobin 11.2 (*)    HCT 37.5 (*)    MCHC 29.9 (*)    Platelets 86 (*)    Neutro Abs 8.2 (*)    All other components within normal limits  BASIC METABOLIC PANEL - Abnormal; Notable for the following components:   Glucose, Bld 159 (*)    BUN 66 (*)    Creatinine, Ser 1.65 (*)    Calcium 8.6 (*)    GFR calc non Af Amer 39 (*)    GFR calc Af Amer 45 (*)    All other components within normal limits  APTT - Abnormal; Notable for the following components:   aPTT 48 (*)    All other components within normal limits  PROTIME-INR - Abnormal; Notable for the following components:   Prothrombin Time 15.4 (*)    All other components within normal limits  URINALYSIS, ROUTINE W REFLEX MICROSCOPIC - Abnormal; Notable for the following components:   Protein, ur 30 (*)    Bacteria, UA RARE (*)    All other components within normal limits  HEPATIC FUNCTION PANEL - Abnormal; Notable for the following components:   Total Protein 6.0 (*)    Albumin 3.0 (*)    ALT 54 (*)    All other components within normal limits  BRAIN NATRIURETIC PEPTIDE - Abnormal; Notable for the following components:   B Natriuretic Peptide 374.0 (*)    All other components within normal limits  CULTURE, BLOOD (ROUTINE X 2)  CULTURE, BLOOD (ROUTINE X 2)  RESPIRATORY PANEL BY RT PCR (FLU A&B, COVID)  URINE CULTURE  LACTIC ACID, PLASMA  LACTIC ACID, PLASMA  D-DIMER, QUANTITATIVE  (NOT AT [facility])  LACTIC ACID, PLASMA  TROPONIN I (HIGH SENSITIVITY)    EKG EKG Interpretation  Date/Time:  Saturday June 23 2019 17:33:05 EST Ventricular Rate:  70 PR Interval:    QRS Duration: 177 QT Interval:  524 QTC Calculation: 566 R Axis:   -103 Text Interpretation: Sinus rhythm Left bundle branch block ? paced Reconfirmed by Vanetta Mulders [phone]) on 06/23/2019 6:15:08 PM   Radiology DG Chest 1V REPEAT Same Day  Result Date: 06/23/2019 CLINICAL DATA:  Evaluate for pulmonary edema. EXAM: CHEST - 1 VIEW SAME DAY COMPARISON:  Chest radiograph 06/23/2019 at 4:19 p.m. FINDINGS: Stable cardiomediastinal contours with enlarged heart size. Left chest pacer in place. Central vascular congestion. There are diffuse bilateral heterogeneous pulmonary opacities likely moderate edema. Mild atelectatic change at the left base. No pneumothorax. Possible trace left pleural effusion. No acute finding in the visualized skeleton. IMPRESSION: Cardiomegaly with central congestion and bilateral pulmonary opacities likely moderate pulmonary edema. Possible trace left effusion. Electronically Signed   By: Emmaline Kluver M.D.   On: 06/23/2019 20:45   DG Chest Portable 1 View  Result Date: 06/23/2019 CLINICAL DATA:  79 year old male with shortness of breath. EXAM: PORTABLE CHEST 1 VIEW COMPARISON:  Chest radiograph dated 04/12/2019. FINDINGS: There is shallow inspiration. Left lung base density, likely chronic atelectasis/scarring. Developing infiltrate is not excluded. An area of airspace density in the right suprahilar region appears similar to prior radiograph, likely chronic. Clinical correlation is recommended. No large pleural effusion. No pneumothorax. There is cardiomegaly with mild vascular congestion. Left pectoral AICD device. No acute osseous pathology. IMPRESSION: 1. Shallow inspiration with left lung base atelectasis/scarring. Developing infiltrate is less likely. Clinical correlation is  recommended. 2. Stable appearing right suprahilar density, likely chronic. 3. Cardiomegaly with mild vascular congestion. Electronically Signed   By: Elgie Collard M.D.   On: 06/23/2019 16:35    Procedures Procedures (including critical care time)    CRITICAL CARE Performed by: Vanetta Mulders Total critical care time: 60 minutes Critical care time was exclusive of separately billable procedures and treating other patients. Critical care was necessary to treat or prevent imminent or life-threatening deterioration. Critical care was time spent personally by me on the following activities: development of treatment plan with patient and/or surrogate as well as nursing, discussions with consultants, evaluation of patient's response to treatment, examination of patient, obtaining history from patient or surrogate, ordering and performing treatments and interventions, ordering and review of laboratory studies, ordering and review of radiographic studies, pulse oximetry and re-evaluation of patient's condition.   Medications Ordered in ED Medications  sodium chloride 0.9 % bolus 1,000 mL (0 mLs Intravenous Stopped 06/23/19 1843)    And  sodium chloride 0.9 % bolus 1,000 mL (0 mLs Intravenous Stopped 06/23/19 1922)    And  sodium chloride 0.9 % bolus 1,000 mL (0 mLs Intravenous Stopped 06/23/19 2030)    And  sodium chloride 0.9 % bolus 1,000 mL (1,000 mLs Intravenous Not Given 06/23/19 2124)  vancomycin (VANCOREADY) IVPB 2000 mg/400 mL ( Intravenous Stopped 06/23/19 2056)    Followed by  vancomycin (VANCOREADY) IVPB 1500 mg/300 mL (has no administration in time range)  ceFEPIme (MAXIPIME) 2 g in sodium chloride 0.9 % 100 mL IVPB (has no administration in time range)  norepinephrine (LEVOPHED) 4mg   in premix infusion (30 mcg/min Intravenous Rate/Dose Verify 06/23/19 2115)  ceFEPIme (MAXIPIME) 2 g in sodium chloride 0.9 % 100 mL IVPB (0 g Intravenous Stopped 06/23/19 1741)  metroNIDAZOLE (FLAGYL) IVPB  500 mg (0 mg Intravenous Stopped 06/23/19 1849)    ED Course  I have reviewed the triage vital signs and the nursing notes.  Pertinent labs & imaging results that were available during my care of the patient were reviewed by me and considered in my medical decision making (see chart for details).    MDM Rules/Calculators/A&P                     Patient initially with hypothermia with some concern for possible sepsis.  Initially was not hypotensive.  But blood pressures shortly after he arrived started to creep down to around 92 systolic.  Sepsis protocol was started.  Based on his size and weight he was to receive 4 L of fluid but we gave him 2-1/2.  The for a brief period pressures were up around 100-1 10 systolic.  And then as we got around 7 in the evening they started to drop and they did seem to stay at 50 or 40 systolic which seemed unusual because his lactic acids x2 were less than 2 patient was still mentating.  He had just wet the bed we put a temp Foley in and he had about 250 cc of urine come out.  Patient was placed on the bear hugger the warmer starting at about 3:30 in the afternoon and with attempt Foley made his temperatures had not really improved much she still is around 33C.  We were confused about the blood pressures.  Patient's chest x-ray at that time the first 1 was normal but the second 1 was [place] show a little bit of pulmonary edema which is understandable because he had received the fluid.  We started norepinephrine.  There was a little bit of confusion with that and then it was infiltrating into his right arm but then when switched to the left his pressures came up quite nicely up to as high as almost 140 systolic titrated down now to just 10 mics of norepinephrine and his pressures are light 108 systolic.  His maps are fine.  Patient is continue to mentate fine.  Continue to make urine and we did a third lactic acid and that was also less than 1 so clearly patient was not  hypoperfusing.  So is possible maybe since he was cold there was a lot of peripheral clamping down and we are getting false blood pressure readings.  Did talk to critical care twice Dr. Cherre Huger,  She wanted me to talk to Dr. Shelda Altes about admission here I did talk to Dr. Sharl Ma he was not comfortable with the overall situation everybody does concur he is probably not truly terribly hypotensive take it with his lactic acid probably was not necessarily even truly septic but hypothermia has been the mainstay.  Called her back made arrangements for him to be transferred to [facility] ED for them to be consulted and for them to see him they were in agreement with that.  Earlier when I spoke to them they felt he definitely warranted ICU admission but there was no ICU beds at [facility] and with Dr.Lama not feeling comfortable with admitting him here that he will need to go to the [facility] ED I contacted Dr. Wilkie Aye at the emergency department she has accepted the patient will get  [facility] to transfer him.  Overall patient is very stable on 2 L of oxygen he satting 100% on the 10 mics of norepinephrine his blood pressure is in the low 100s.   In summary we feel that he is not truly hypotensive otherwise lactic acid is 1 to remain the way they are.  Feel he is may be developing a little bit of pulmonary edema as a known history of CHF secondary to the fluids.  He only received 2.5 L of fluid.  Also his troponin was normal his D-dimer is normal.  When patient arrives to the [facility] ED contact critical care service to evaluate him.  They were talking about possible art line.  Final Clinical Impression(s) / ED Diagnoses Final diagnoses:  SOB (shortness of breath)  Hypotension, unspecified hypotension type    Rx / DC Orders ED Discharge Orders    None       Fredia Sorrow, MD 06/23/19 2351

## 2019-06-23 NOTE — ED Notes (Signed)
Pt alert and oriented to month, place and name.

## 2019-06-23 NOTE — Progress Notes (Signed)
Notified bedside nurse of need to draw lactic acid, know blood cultures are delayed due to difficult stick.

## 2019-06-23 NOTE — ED Notes (Signed)
Pt is a difficult stick, delay in blood cultures.  First antibiotic started.

## 2019-06-23 NOTE — ED Notes (Signed)
Pt's wife Jatorian Renault can be reached at [phone]. She was notified at this time of pt's impending transfer to [facility] ED.

## 2019-06-23 NOTE — Progress Notes (Signed)
Notified bedside nurse of need to administer fluid bolus, pt needs 3702 cc.

## 2019-06-24 DIAGNOSIS — R6521 Severe sepsis with septic shock: Secondary | ICD-10-CM | POA: Diagnosis not present

## 2019-06-24 DIAGNOSIS — N1831 Chronic kidney disease, stage 3a: Secondary | ICD-10-CM | POA: Diagnosis not present

## 2019-06-24 DIAGNOSIS — I5022 Chronic systolic (congestive) heart failure: Secondary | ICD-10-CM | POA: Diagnosis not present

## 2019-06-24 DIAGNOSIS — E1165 Type 2 diabetes mellitus with hyperglycemia: Secondary | ICD-10-CM | POA: Diagnosis not present

## 2019-06-24 DIAGNOSIS — I4811 Longstanding persistent atrial fibrillation: Secondary | ICD-10-CM | POA: Diagnosis not present

## 2019-06-24 DIAGNOSIS — E871 Hypo-osmolality and hyponatremia: Secondary | ICD-10-CM | POA: Diagnosis not present

## 2019-06-24 DIAGNOSIS — Z7401 Bed confinement status: Secondary | ICD-10-CM | POA: Diagnosis not present

## 2019-06-24 DIAGNOSIS — Z950 Presence of cardiac pacemaker: Secondary | ICD-10-CM | POA: Diagnosis not present

## 2019-06-24 DIAGNOSIS — I959 Hypotension, unspecified: Secondary | ICD-10-CM | POA: Diagnosis not present

## 2019-06-24 DIAGNOSIS — I2510 Atherosclerotic heart disease of native coronary artery without angina pectoris: Secondary | ICD-10-CM | POA: Diagnosis not present

## 2019-06-24 DIAGNOSIS — Z9989 Dependence on other enabling machines and devices: Secondary | ICD-10-CM

## 2019-06-24 DIAGNOSIS — E162 Hypoglycemia, unspecified: Secondary | ICD-10-CM | POA: Diagnosis not present

## 2019-06-24 DIAGNOSIS — I5043 Acute on chronic combined systolic (congestive) and diastolic (congestive) heart failure: Secondary | ICD-10-CM | POA: Diagnosis not present

## 2019-06-24 DIAGNOSIS — E872 Acidosis: Secondary | ICD-10-CM | POA: Diagnosis not present

## 2019-06-24 DIAGNOSIS — E785 Hyperlipidemia, unspecified: Secondary | ICD-10-CM | POA: Diagnosis present

## 2019-06-24 DIAGNOSIS — Z6841 Body Mass Index (BMI) 40.0 and over, adult: Secondary | ICD-10-CM | POA: Diagnosis not present

## 2019-06-24 DIAGNOSIS — J810 Acute pulmonary edema: Secondary | ICD-10-CM | POA: Diagnosis not present

## 2019-06-24 DIAGNOSIS — Z95810 Presence of automatic (implantable) cardiac defibrillator: Secondary | ICD-10-CM | POA: Diagnosis not present

## 2019-06-24 DIAGNOSIS — D689 Coagulation defect, unspecified: Secondary | ICD-10-CM | POA: Diagnosis present

## 2019-06-24 DIAGNOSIS — I484 Atypical atrial flutter: Secondary | ICD-10-CM | POA: Diagnosis not present

## 2019-06-24 DIAGNOSIS — J9690 Respiratory failure, unspecified, unspecified whether with hypoxia or hypercapnia: Secondary | ICD-10-CM | POA: Diagnosis not present

## 2019-06-24 DIAGNOSIS — E870 Hyperosmolality and hypernatremia: Secondary | ICD-10-CM | POA: Diagnosis not present

## 2019-06-24 DIAGNOSIS — M2550 Pain in unspecified joint: Secondary | ICD-10-CM | POA: Diagnosis not present

## 2019-06-24 DIAGNOSIS — I499 Cardiac arrhythmia, unspecified: Secondary | ICD-10-CM | POA: Diagnosis not present

## 2019-06-24 DIAGNOSIS — Z20822 Contact with and (suspected) exposure to covid-19: Secondary | ICD-10-CM | POA: Diagnosis not present

## 2019-06-24 DIAGNOSIS — A4181 Sepsis due to Enterococcus: Secondary | ICD-10-CM | POA: Diagnosis not present

## 2019-06-24 DIAGNOSIS — R069 Unspecified abnormalities of breathing: Secondary | ICD-10-CM | POA: Diagnosis not present

## 2019-06-24 DIAGNOSIS — R579 Shock, unspecified: Secondary | ICD-10-CM | POA: Diagnosis not present

## 2019-06-24 DIAGNOSIS — R0602 Shortness of breath: Secondary | ICD-10-CM | POA: Diagnosis not present

## 2019-06-24 DIAGNOSIS — I4821 Permanent atrial fibrillation: Secondary | ICD-10-CM | POA: Diagnosis not present

## 2019-06-24 DIAGNOSIS — G9340 Encephalopathy, unspecified: Secondary | ICD-10-CM | POA: Diagnosis not present

## 2019-06-24 DIAGNOSIS — I480 Paroxysmal atrial fibrillation: Secondary | ICD-10-CM | POA: Diagnosis not present

## 2019-06-24 DIAGNOSIS — N179 Acute kidney failure, unspecified: Secondary | ICD-10-CM | POA: Diagnosis not present

## 2019-06-24 DIAGNOSIS — I4891 Unspecified atrial fibrillation: Secondary | ICD-10-CM | POA: Diagnosis not present

## 2019-06-24 DIAGNOSIS — E1122 Type 2 diabetes mellitus with diabetic chronic kidney disease: Secondary | ICD-10-CM | POA: Diagnosis present

## 2019-06-24 DIAGNOSIS — R1310 Dysphagia, unspecified: Secondary | ICD-10-CM | POA: Diagnosis not present

## 2019-06-24 DIAGNOSIS — Z4682 Encounter for fitting and adjustment of non-vascular catheter: Secondary | ICD-10-CM | POA: Diagnosis not present

## 2019-06-24 DIAGNOSIS — E1140 Type 2 diabetes mellitus with diabetic neuropathy, unspecified: Secondary | ICD-10-CM | POA: Diagnosis not present

## 2019-06-24 DIAGNOSIS — J9601 Acute respiratory failure with hypoxia: Secondary | ICD-10-CM | POA: Diagnosis not present

## 2019-06-24 DIAGNOSIS — F5101 Primary insomnia: Secondary | ICD-10-CM | POA: Diagnosis present

## 2019-06-24 DIAGNOSIS — J811 Chronic pulmonary edema: Secondary | ICD-10-CM | POA: Diagnosis present

## 2019-06-24 DIAGNOSIS — D631 Anemia in chronic kidney disease: Secondary | ICD-10-CM | POA: Diagnosis not present

## 2019-06-24 DIAGNOSIS — R400 Somnolence: Secondary | ICD-10-CM | POA: Diagnosis not present

## 2019-06-24 DIAGNOSIS — N390 Urinary tract infection, site not specified: Secondary | ICD-10-CM | POA: Diagnosis not present

## 2019-06-24 DIAGNOSIS — G4733 Obstructive sleep apnea (adult) (pediatric): Secondary | ICD-10-CM

## 2019-06-24 DIAGNOSIS — I255 Ischemic cardiomyopathy: Secondary | ICD-10-CM | POA: Diagnosis present

## 2019-06-24 DIAGNOSIS — I130 Hypertensive heart and chronic kidney disease with heart failure and stage 1 through stage 4 chronic kidney disease, or unspecified chronic kidney disease: Secondary | ICD-10-CM | POA: Diagnosis not present

## 2019-06-24 DIAGNOSIS — E2740 Unspecified adrenocortical insufficiency: Secondary | ICD-10-CM | POA: Diagnosis present

## 2019-06-24 DIAGNOSIS — R52 Pain, unspecified: Secondary | ICD-10-CM | POA: Diagnosis not present

## 2019-06-24 DIAGNOSIS — Z7901 Long term (current) use of anticoagulants: Secondary | ICD-10-CM | POA: Diagnosis not present

## 2019-06-24 DIAGNOSIS — Z9911 Dependence on respirator [ventilator] status: Secondary | ICD-10-CM | POA: Diagnosis not present

## 2019-06-24 DIAGNOSIS — G9341 Metabolic encephalopathy: Secondary | ICD-10-CM | POA: Diagnosis not present

## 2019-06-24 DIAGNOSIS — J9622 Acute and chronic respiratory failure with hypercapnia: Secondary | ICD-10-CM | POA: Diagnosis not present

## 2019-06-24 LAB — COMPREHENSIVE METABOLIC PANEL
ALT: 57 U/L — ABNORMAL HIGH (ref 0–44)
AST: 35 U/L (ref 15–41)
Albumin: 3 g/dL — ABNORMAL LOW (ref 3.5–5.0)
Alkaline Phosphatase: 115 U/L (ref 38–126)
Anion gap: 9 (ref 5–15)
BUN: 62 mg/dL — ABNORMAL HIGH (ref 8–23)
CO2: 19 mmol/L — ABNORMAL LOW (ref 22–32)
Calcium: 8.3 mg/dL — ABNORMAL LOW (ref 8.9–10.3)
Chloride: 117 mmol/L — ABNORMAL HIGH (ref 98–111)
Creatinine, Ser: 1.72 mg/dL — ABNORMAL HIGH (ref 0.61–1.24)
GFR calc Af Amer: 43 mL/min — ABNORMAL LOW (ref >60)
GFR calc non Af Amer: 37 mL/min — ABNORMAL LOW (ref >60)
Glucose, Bld: 64 mg/dL — ABNORMAL LOW (ref 70–99)
Potassium: 5.5 mmol/L — ABNORMAL HIGH (ref 3.5–5.1)
Sodium: 145 mmol/L (ref 135–145)
Total Bilirubin: 0.7 mg/dL (ref 0.3–1.2)
Total Protein: 6.2 g/dL — ABNORMAL LOW (ref 6.5–8.1)

## 2019-06-24 LAB — CBC
HCT: 38.3 % — ABNORMAL LOW (ref 39.0–52.0)
Hemoglobin: 11.2 g/dL — ABNORMAL LOW (ref 13.0–17.0)
MCH: 27.7 pg (ref 26.0–34.0)
MCHC: 29.2 g/dL — ABNORMAL LOW (ref 30.0–36.0)
MCV: 94.6 fL (ref 80.0–100.0)
Platelets: DECREASED 10*3/uL (ref 150–400)
RBC: 4.05 MIL/uL — ABNORMAL LOW (ref 4.22–5.81)
RDW: 13.8 % (ref 11.5–15.5)
WBC: 12.1 10*3/uL — ABNORMAL HIGH (ref 4.0–10.5)
nRBC: 0.2 % (ref 0.0–0.2)

## 2019-06-24 LAB — GLUCOSE, CAPILLARY
Glucose-Capillary: 37 mg/dL — CL (ref 70–99)
Glucose-Capillary: 90 mg/dL (ref 70–99)
Glucose-Capillary: 152 mg/dL — ABNORMAL HIGH (ref 70–99)
Glucose-Capillary: 109 mg/dL — ABNORMAL HIGH (ref 70–99)

## 2019-06-24 LAB — PROTIME-INR
INR: 1.7 — ABNORMAL HIGH (ref 0.8–1.2)
Prothrombin Time: 19.9 seconds — ABNORMAL HIGH (ref 11.4–15.2)

## 2019-06-24 LAB — MAGNESIUM: Magnesium: 1.9 mg/dL (ref 1.7–2.4)

## 2019-06-24 LAB — CBG MONITORING, ED: Glucose-Capillary: 77 mg/dL (ref 70–99)

## 2019-06-24 LAB — MRSA PCR SCREENING: MRSA by PCR: NEGATIVE

## 2019-06-24 LAB — PHOSPHORUS: Phosphorus: 6 mg/dL — ABNORMAL HIGH (ref 2.5–4.6)

## 2019-06-24 LAB — CORTISOL: Cortisol, Plasma: 11.3 ug/dL

## 2019-06-24 LAB — LACTIC ACID, PLASMA: Lactic Acid, Venous: 0.5 mmol/L (ref 0.5–1.9)

## 2019-06-24 LAB — PROCALCITONIN: Procalcitonin: <0.1 ng/mL

## 2019-06-24 MED ORDER — CHLORHEXIDINE GLUCONATE CLOTH 2 % EX PADS
6.0000 | MEDICATED_PAD | Freq: Every day | CUTANEOUS | Status: DC
  Administered 2019-06-24 – 2019-07-05 (×11): 6 via TOPICAL

## 2019-06-24 MED ORDER — WARFARIN SODIUM 7.5 MG PO TABS
7.5000 mg | ORAL_TABLET | Freq: Once | ORAL | Status: AC
  Administered 2019-06-24: 7.5 mg via ORAL
  Filled 2019-06-24: qty 1

## 2019-06-24 MED ORDER — GABAPENTIN 100 MG PO CAPS
100.0000 mg | ORAL_CAPSULE | Freq: Two times a day (BID) | ORAL | Status: DC
  Administered 2019-06-24 (×2): 100 mg via ORAL
  Filled 2019-06-24 (×3): qty 1

## 2019-06-24 MED ORDER — SACUBITRIL-VALSARTAN 49-51 MG PO TABS
1.0000 | ORAL_TABLET | Freq: Two times a day (BID) | ORAL | Status: DC
  Administered 2019-06-24 (×2): 1 via ORAL
  Filled 2019-06-24 (×2): qty 1

## 2019-06-24 MED ORDER — LEVETIRACETAM 250 MG PO TABS
750.0000 mg | ORAL_TABLET | Freq: Two times a day (BID) | ORAL | Status: DC
  Administered 2019-06-24 (×2): 750 mg via ORAL
  Filled 2019-06-24: qty 1
  Filled 2019-06-24: qty 3
  Filled 2019-06-24: qty 1
  Filled 2019-06-24: qty 3

## 2019-06-24 MED ORDER — SODIUM CHLORIDE 0.9 % IV SOLN
2.0000 g | Freq: Two times a day (BID) | INTRAVENOUS | Status: DC
  Administered 2019-06-25 (×3): 2 g via INTRAVENOUS
  Filled 2019-06-24 (×5): qty 2

## 2019-06-24 MED ORDER — INSULIN GLARGINE 100 UNIT/ML ~~LOC~~ SOLN
60.0000 [IU] | Freq: Every day | SUBCUTANEOUS | Status: DC
  Administered 2019-06-24: 60 [IU] via SUBCUTANEOUS
  Filled 2019-06-24 (×4): qty 0.6

## 2019-06-24 MED ORDER — WARFARIN - PHARMACIST DOSING INPATIENT
Freq: Every day | Status: DC
  Administered 2019-06-29: 1

## 2019-06-24 MED ORDER — LORATADINE 10 MG PO TABS
10.0000 mg | ORAL_TABLET | Freq: Every day | ORAL | Status: DC
  Administered 2019-06-24: 10 mg via ORAL
  Filled 2019-06-24 (×2): qty 1

## 2019-06-24 NOTE — ED Notes (Signed)
Bair hugger applied to patient.

## 2019-06-24 NOTE — Procedures (Signed)
Arterial Catheter Insertion Procedure Note Noah [name] [id number] 1940/09/27  Procedure: Insertion of Arterial Catheter  Indications: Blood pressure monitoring  Procedure Details Consent: Risks of procedure as well as the alternatives and risks of each were explained to the (patient/caregiver).  Consent for procedure obtained. Time Out: Verified patient identification, verified procedure, site/side was marked, verified correct patient position, special equipment/implants available, medications/allergies/relevent history reviewed, required imaging and test results available.  Performed  Maximum sterile technique was used including antiseptics, cap, gloves, gown, hand hygiene and mask. Skin prep: Chlorhexidine; local anesthetic administered 20 gauge catheter was inserted into right radial artery using the Seldinger technique. ULTRASOUND GUIDANCE USED: NO Evaluation Blood flow good; BP tracing good. Complications: No apparent complications.  Right Arterial Line placed, line draws back w/o complications, flushes, and good waveform noted on the monitor.   Benjamine Sprague, BS, RRT, RCP 06/24/2019

## 2019-06-24 NOTE — ED Notes (Signed)
Dr Deretha Emory notified of Blood Glucose of 77. No new orders given.

## 2019-06-24 NOTE — H&P (Signed)
NAME:  Noah [name], MRN:  [id number], DOB:  Jul 31, 1940, LOS: 0 ADMISSION DATE:  06/23/2019, CONSULTATION DATE: 06/24/2019 REFERRING MD:  Vanetta Mulders, MD CHIEF COMPLAINT:  SOB    Brief History   79 yr old M w/ PMHx CAD s/p PCI in 2008, ischemic cardiomyopathy, combined systolic and diastolic dysfunction, s/p BiV ICD, A fib ( on Coumadin AC), DM, HTN, GERD, HLD, OSA ( on 3L O2 per EMR) and CKD Stage 3 presented to [facility] on 3/6 with hypothermia and hypotension. Received 2.5 L IVF subsequent CXR showed pulmonary edema.started on Levophed gtt. Transferred to [facility]  History of present illness   79 yr old M w/ PMHx CAD s/p multiple PCIs last one documented was in 2008, ischemic cardiomyopathy, combined systolic and diastolic dysfunction, Last ECHO showed an EF 45%, s/p BiV ICD, DM, HTN, GERD, HLD, OSA and CKD Stage 3  Was admitted in Jan 2021 and discharged to SNF. Has been following up via Telephone encounters. Noted on 06/22/2019 to have ongoing Afib and was contacted by RN. During telephone encounter he stated he was having trouble walking and feeling disoriented in comparison to his baseline.    On presentation to [facility] on 06/23/2019 per the triage note patient has been more short of breath than usual with audible wheezing no lower ext edema.  Initial vitals:  Pt was noted to be hypothermic and was placed on a Lawyer at 1530. Became hypotensive to systolic BP 92 mmHg Presumed to be sepsis. He had Repeated lactic acid levels all < 2.  Per Dr Darlyn Chamber documentation 30 cc/kg would have been 4L IVF. Pt  Received only 2.5 L IVF and then was noted to have a change in mental status and requiring oxygen via [place] at 2L A subsequent CXR showed pulmonary edema.and increased pulmonary vascular congestion, No further fluids were given. A foley catheter was placed UOP 250cc.Due to persistent hypotension patient was started on Levophed gtt. His Lactic acid x3 < 2 and pt maintained mentation.  Pt was transferred to  [facility] for ICU admission.  Past Medical History   has a past medical history of CAD S/P percutaneous coronary angioplasty, Chronic kidney disease, stage III (moderate), Diabetes mellitus with complication (HCC), Gastro-esophageal reflux disease with esophagitis, Hyperlipidemia, Hypertension, Ischemic cardiomyopathy (01/20/2018), Morbid obesity (HCC), Obstructive sleep apnea, Polyneuropathy, and Primary insomnia. Atrial fibrillation   Significant Hospital Events   Hypotensive Received 2.5L IVF Pulmonary edema with SOB Hypotensive with normal lactate Started on Levophed gtt Transferred to [facility]--> for ICU admission  Consults:  None at time of evaluation  Procedures:  None at time of evaluation  Significant Diagnostic Tests:  LA 0.7--> 0.5 PCT <0.1 Hgb 11.2 WBC 12.1 Plts clumped Glucose 64 K= 5.5 Cr 1.72 UA negative + protein w/ hyaline casts   ECHO 05/18/2018 IMPRESSIONS  1. The left ventricle has mild-moderately reduced systolic function of  40-45%. The cavity size is normal. There is moderate left ventricular wall  thickness. Echo evidence of unable to assess diastolic filling patterns.  2. Hypokinesis if the mid-apical inferior, inferoseptal, anteroseptal and  apical myocardium.  3. Normal left atrial size.  4. Normal right atrial size.  5. Normal tricuspid valve.  6. The inferior vena cava was dilated in size with >50% respiratory  variablity.  7. No atrial level shunt detected by color flow Doppler.  Micro Data:  06/23/2019>>RVP negative 06/23/2019>>SARSCOV2 negative 06/23/2019>>Influenza A and B negative  06/23/2019>>>Blood and urine cx sent at [facility]  Antimicrobials:  06/23/2019>>Vancomycin  06/23/2019>>Cefepime   Objective   Blood pressure 121/75, pulse 78, temperature (!) 95.4 F (35.2 C), resp. rate 17, height 5\' 8"  (1.727 m), weight 123.4 kg, SpO2 100 %.        Intake/Output Summary (Last 24 hours) at 06/24/2019 0255 Last data filed at 06/24/2019 0211 Gross per 24  hour  Intake 3762.08 ml  Output 250 ml  Net 3512.08 ml   Filed Weights   06/23/19 1531  Weight: 123.4 kg    Examination: General: obese male in no acute distress HENT: increased neck circumference on [place] EOMi PERRLA Lungs: + crackles b/l  Min  polyphonic wheezing Cardiovascular: S1 and S2 irreg rhythm Abdomen: soft obese abdomen not distended +BS Extremities: no pitting edema  Neuro: AAO no focal deficit appreciated  Skin: grossly intact  Assessment & Plan:  1. Hypotension- etiology unclear Low index os suspicion for Septic shock LA<1 PCT <0.1 No clear source UA neg for leuk and nitrite CXR does not show lobar consolidation --> more pulm edema picture Pt has a h/o combined systolic and diastolic dysfunction and ischemic cardiomyopathy With Afib recently documented to have repeated events prior to admission May be Cardiogenic Plan: Arterial line for more accurate hemodynamic monitoring MAP goal >66mmHg titrate Levophed using this goal Check cortisol  ECHO to assess EF  Check INR  Continue on Cardia monitoring  HR in 70s not requiring rate control Deescalate Abx when possible given PCT <0.1  2. H/o OSA  On supp O2 at 3L at baseline Plan: Continue on supp O2 to keep Sat >92% CPAP/AVAPS when sleeping   3. Cardiogenic Pulmonary edema CXR reviewed  Occurred s/p 2.5 L IVF IVC on bedside CC 77m not varying with resp and >2 + B lines throughout lung fields No pericardial effusion Plan Diuresis when MAPs improve   4. 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 bicarbonate loss Plan: Avoid nephrotoxic meds including NSAIDs Trend BUN/ CR Monitor UOP    Best practice:  Diet: NPO for now--> reassess Cardiac diet  Pain/Anxiety/Delirium protocol (if indicated): not at this time VAP protocol (if indicated): not intubated DVT prophylaxis: On Anticoagulation with Coumadin for  Afib f/u INR GI prophylaxis: not indicated Glucose control: if BG exceeds 180mg /dl will evaluate for ISS Mobility: Bedrest Code Status: Full  Family Communication: Pt's wife  Is Noah [name]  [phone] Disposition: ICU  Labs   CBC: Recent Labs  Lab 06/23/19 1552  WBC 9.7  NEUTROABS 8.2*  HGB 11.2*  HCT 37.5*  MCV 93.1  PLT 86*    Basic Metabolic Panel: Recent Labs  Lab 06/23/19 1552  NA 141  K 5.1  CL 111  CO2 24  GLUCOSE 159*  BUN 66*  CREATININE 1.65*  CALCIUM 8.6*   GFR: Estimated Creatinine Clearance: 47.2 mL/min (A) (by C-G formula based on SCr of 1.65 mg/dL (H)). Recent Labs  Lab 06/23/19 1552 06/23/19 1727 06/23/19 1903 06/23/19 2125  WBC 9.7  --   --   --   LATICACIDVEN  --  0.7 0.6 0.7    Liver Function Tests: Recent Labs  Lab 06/23/19 1727  AST 39  ALT 54*  ALKPHOS 101  BILITOT 0.6  PROT 6.0*  ALBUMIN 3.0*   No results for input(s): LIPASE, AMYLASE in the last 168 hours. No results for input(s): AMMONIA in the last 168 hours.  ABG  Component Value Date/Time   PHART 7.430 04/12/2019 1955   PCO2ART 40.1 04/12/2019 1955   PO2ART 87.3 04/12/2019 1955   HCO3 26.4 04/12/2019 1955   O2SAT 95.9 04/12/2019 1955     Coagulation Profile: Recent Labs  Lab 06/23/19 1727  INR 1.2    Cardiac Enzymes: No results for input(s): CKTOTAL, CKMB, CKMBINDEX, TROPONINI in the last 168 hours.  HbA1C: Hgb A1c MFr Bld  Date/Time Value Ref Range Status  03/28/2019 08:19 PM 8.7 (H) 4.8 - 5.6 % Final    Comment:    (NOTE) Pre diabetes:          5.7%-6.4% Diabetes:              >6.4% Glycemic control for   <7.0% adults with diabetes   05/19/2018 06:07 AM 9.5 (H) 4.8 - 5.6 % Final    Comment:    (NOTE) Pre diabetes:          5.7%-6.4% Diabetes:              >6.4% Glycemic control for   <7.0% adults with diabetes     CBG: Recent Labs  Lab 06/24/19 0008  GLUCAP 77    Review of Systems:   Marland KitchenMarland KitchenReview of Systems  Constitutional:  Positive for malaise/fatigue. Negative for chills and fever.  HENT: Negative.   Eyes: Negative.   Respiratory: Positive for shortness of breath.   Cardiovascular: Positive for orthopnea.  Gastrointestinal: Negative.   Genitourinary: Negative.   Musculoskeletal: Negative.   Skin: Negative.   Neurological: Positive for weakness. Negative for dizziness.  Endo/Heme/Allergies: Bruises/bleeds easily.  Psychiatric/Behavioral: Negative.      Past Medical History  He,  has a past medical history of CAD S/P percutaneous coronary angioplasty, Chronic kidney disease, stage III (moderate), Diabetes mellitus with complication ([place]), Gastro-esophageal reflux disease with esophagitis, Hyperlipidemia, Hypertension, Ischemic cardiomyopathy (01/20/2018), Morbid obesity ([place]), Obstructive sleep apnea, Polyneuropathy, and Primary insomnia.   Surgical History    Past Surgical History:  Procedure Laterality Date  . BIV ICD INSERTION CRT-D N/A 01/20/2018   Procedure: BIV ICD INSERTION CRT-D;  Surgeon: Evans Lance, MD;  Location: [place] [facility];  Service: Cardiovascular;  Laterality: N/A;  . BIV PACEMAKER INSERTION CRT-P  01/20/2018   St Jude- Dr Lovena Le  . CARDIAC CATHETERIZATION     multiple angioplasty X 8, 4 stents      Social History   reports that he quit smoking about 18 years ago. He has a 30.00 pack-year smoking history. He has never used smokeless tobacco. He reports current alcohol use. He reports that he does not use drugs.   Family History   His family history includes Breast cancer in his mother; CAD in his mother; Cancer in his brother; Heart attack in his father.   Allergies Allergies  Allergen Reactions  . Codeine Other (See Comments)    constipation  . Erythromycin-Sulfisoxazole Other (See Comments)    Causes infection in throat and eyes     Home Medications  Prior to Admission medications   Medication Sig Start Date End Date Taking? Authorizing Provider  acetaminophen  (TYLENOL) 650 MG CR tablet Take 650-1,300 mg by mouth daily as needed for pain.    Yes [provider]  aspirin EC 81 MG tablet Take 1 tablet (81 mg total) by mouth daily with breakfast. 04/25/19  Yes Emokpae, Courage, MD  cetirizine (ZYRTEC) 10 MG tablet Take 10 mg by mouth daily.   Yes [provider]  Continuous Blood  Gluc Sensor (FREESTYLE LIBRE 14 DAY SENSOR) MISC Inject 1 each into the skin every 14 (fourteen) days. Use as directed. 06/01/19  Yes Nida, Denman George, MD  ENTRESTO 49-51 MG Take 1 tablet by mouth 2 (two) times daily. 06/15/19  Yes [provider]  finasteride (PROSCAR) 5 MG tablet Take 5 mg by mouth every evening.  08/13/17  Yes [provider]  FLUoxetine (PROZAC) 20 MG capsule Take 20 mg by mouth every evening.   Yes [provider]  furosemide (LASIX) 40 MG tablet Take 1 tablet (40 mg total) by mouth daily. 04/26/19  Yes Emokpae, Courage, MD  gabapentin (NEURONTIN) 100 MG capsule Take 1 capsule (100 mg total) by mouth 2 (two) times daily. 04/25/19  Yes Emokpae, Courage, MD  glipiZIDE (GLUCOTROL XL) 2.5 MG 24 hr tablet Take 1 tablet (2.5 mg total) by mouth daily with breakfast. 06/11/19  Yes Nida, Denman George, MD  Insulin Glargine-Lixisenatide (SOLIQUA) 100-33 UNT-MCG/ML SOPN Inject 60 Units into the skin daily with breakfast. 06/11/19  Yes Nida, Denman George, MD  levETIRAcetam (KEPPRA) 750 MG tablet Take 750 mg by mouth 2 (two) times daily. 07/10/18  Yes [provider]  metoprolol succinate (TOPROL XL) 50 MG 24 hr tablet Take 1 tablet (50 mg total) by mouth daily. Take with or immediately following a meal. 04/25/19 04/24/20 Yes Emokpae, Courage, MD  Omega-3 Fatty Acids (FISH OIL) 1000 MG CAPS Take 1,000 mg by mouth 2 (two) times daily.    Yes [provider]  pantoprazole (PROTONIX) 40 MG tablet Take 1 tablet (40 mg total) by mouth daily. 04/26/19  Yes Emokpae, Courage, MD  Tamsulosin HCl (FLOMAX) 0.4 MG CAPS Take 0.8 mg by  mouth every evening.   Yes [provider]  warfarin (COUMADIN) 5 MG tablet TAKE 1/2 TABLET (2.5MG ) DAILY EXCEPT 1 TABLET (5MG ) ON TUESDAYS, THURSDAYS AND SATURDAYS 06/11/19  Yes 06/13/19, MD  Zinc 50 MG TABS Take 200 mg by mouth daily.   Yes [provider]    I, Dr Laqueta Linden have personally reviewed patient's available data, including medical history, events of note, physical examination and test results as part of my evaluation. I have discussed with PA and other care providers such as pharmacist, RN and Elink.  In addition,  I personally evaluated patient  The patient is critically ill with multiple organ systems failure and requires high complexity decision making for assessment and support, frequent evaluation and titration of therapies, application of advanced monitoring technologies and extensive interpretation of multiple databases.   Critical Care Time devoted to patient care services described in this note is 55  Minutes. This time reflects time of care of this signee Dr Newell Coral. This critical care time does not reflect procedure time, or teaching time or supervisory time  but could involve care discussion time   Dr. Newell Coral Pulmonary Critical Care Medicine  06/24/2019 6:39 AM    Critical care time: 55 mins

## 2019-06-24 NOTE — ED Provider Notes (Signed)
0145  Patient seen in transfer from [facility].  Continues to have Levophed infusing at 10.  Initial blood pressure 100 systolic.  Repeat blood pressure 73 systolic.  Has no complaints at this time.  Reports shortness of breath.  No chest pain.  He is under the bear hugger.  He is chronically ill-appearing but nontoxic.  He appears to be perfusing and mentating at this point.  Advised ICU of his presence in the ER.  They will be down to evaluate.   Shon Baton, MD 06/24/19 [phone]

## 2019-06-24 NOTE — Progress Notes (Signed)
ANTICOAGULATION CONSULT NOTE - Initial Consult  Pharmacy Consult for Coumadin Indication: atrial fibrillation  Allergies  Allergen Reactions  . Codeine Other (See Comments)    constipation  . Erythromycin-Sulfisoxazole Other (See Comments)    Causes infection in throat and eyes    Patient Measurements: Height: 5\' 8"  (172.7 cm) Weight: 272 lb (123.4 kg) IBW/kg (Calculated) : 68.4  Vital Signs: Temp: 95.5 F (35.3 C) (03/07 0330) Temp Source: Rectal (03/06 1550) BP: 101/58 (03/07 0330) Pulse Rate: 78 (03/07 0330)  Labs: Recent Labs    06/23/19 1552 06/23/19 1727 06/23/19 1903 06/23/19 2125  HGB 11.2*  --   --   --   HCT 37.5*  --   --   --   PLT 86*  --   --   --   APTT  --  48*  --   --   LABPROT  --  15.4*  --   --   INR  --  1.2  --   --   CREATININE 1.65*  --   --   --   TROPONINIHS  --   --  6 6    Estimated Creatinine Clearance: 47.2 mL/min (A) (by C-G formula based on SCr of 1.65 mg/dL (H)).   Medical History: Past Medical History:  Diagnosis Date  . CAD S/P percutaneous coronary angioplasty    remote PCIs in 1990's- last CFX PCI 2008  . Chronic kidney disease, stage III (moderate)   . Diabetes mellitus with complication (HCC)    with hyperglycemia and diabetic autonomic poly neuropathy  . Gastro-esophageal reflux disease with esophagitis   . Hyperlipidemia   . Hypertension   . Ischemic cardiomyopathy 01/20/2018   St Jude ICD   . Morbid obesity (HCC)   . Obstructive sleep apnea    on CPAP  . Polyneuropathy   . Primary insomnia     Assessment: 79yo male presented to [facility] ED c/o SOB, concern for pulmonary edema, tx'd to [facility] for further evaluation, to continue Coumadin for Afib; current INR below goal with last dose of Coumadin taken 3/6.  Goal of Therapy:  INR 2-3   Plan:  Will give boosted dose of Coumadin 7.5mg  and monitor INR for dose adjustments.  [facility], PharmD, BCPS  06/24/2019,3:36 AM

## 2019-06-24 NOTE — Progress Notes (Signed)
Pharmacy Antibiotic Note  Noah [name] is a 79 y.o. male admitted on 06/23/2019 with unknown source.  Pharmacy has been consulted for Vancomycin and cefepime dosing. Cr has risen slightly, will adjust cefepime.  Plan: -Vancomycin 1500mg  q24h -Reduce cefepime to 2g IV q12h  Height: 5\' 8"  (172.7 cm) Weight: 284 lb 13.4 oz (129.2 kg) IBW/kg (Calculated) : 68.4  Temp (24hrs), Avg:93.8 F (34.3 C), Min:82.4 F (28 C), Max:95.5 F (35.3 C)  Recent Labs  Lab 06/23/19 1552 06/23/19 1727 06/23/19 1903 06/23/19 2125 06/24/19 0304 06/24/19 0312  WBC 9.7  --   --   --  12.1*  --   CREATININE 1.65*  --   --   --  1.72*  --   LATICACIDVEN  --  0.7 0.6 0.7  --  0.5    Estimated Creatinine Clearance: 46.4 mL/min (A) (by C-G formula based on SCr of 1.72 mg/dL (H)).    Allergies  Allergen Reactions  . Codeine Other (See Comments)    constipation  . Erythromycin-Sulfisoxazole Other (See Comments)    Causes infection in throat and eyes    Antimicrobials this admission: Vancomycin 3/6 >>  Cefepime 3/6 >>   Microbiology results: 3/6 BCx: pending 3/6 UCx: pending 3/6 MRSA PCR: negative  08/24/19, PharmD, BCPS Clinical Pharmacist [phone] Please check AMION for all [facility] Pharmacy numbers 06/24/2019

## 2019-06-25 ENCOUNTER — Inpatient Hospital Stay (HOSPITAL_COMMUNITY): Payer: Medicare HMO

## 2019-06-25 ENCOUNTER — Ambulatory Visit (HOSPITAL_COMMUNITY): Payer: Medicare HMO | Admitting: Physician Assistant

## 2019-06-25 ENCOUNTER — Other Ambulatory Visit (HOSPITAL_COMMUNITY): Payer: Medicare HMO

## 2019-06-25 DIAGNOSIS — E162 Hypoglycemia, unspecified: Secondary | ICD-10-CM

## 2019-06-25 DIAGNOSIS — Z7901 Long term (current) use of anticoagulants: Secondary | ICD-10-CM

## 2019-06-25 DIAGNOSIS — R579 Shock, unspecified: Secondary | ICD-10-CM

## 2019-06-25 DIAGNOSIS — R400 Somnolence: Secondary | ICD-10-CM

## 2019-06-25 DIAGNOSIS — Z9911 Dependence on respirator [ventilator] status: Secondary | ICD-10-CM

## 2019-06-25 DIAGNOSIS — G9340 Encephalopathy, unspecified: Secondary | ICD-10-CM

## 2019-06-25 DIAGNOSIS — I4891 Unspecified atrial fibrillation: Secondary | ICD-10-CM

## 2019-06-25 LAB — POCT I-STAT 7, (LYTES, BLD GAS, ICA,H+H)
Acid-base deficit: 11 mmol/L — ABNORMAL HIGH (ref 0.0–2.0)
Acid-base deficit: 10 mmol/L — ABNORMAL HIGH (ref 0.0–2.0)
Acid-base deficit: 6 mmol/L — ABNORMAL HIGH (ref 0.0–2.0)
Acid-base deficit: 6 mmol/L — ABNORMAL HIGH (ref 0.0–2.0)
Bicarbonate: 19.4 mmol/L — ABNORMAL LOW (ref 20.0–28.0)
Bicarbonate: 18.6 mmol/L — ABNORMAL LOW (ref 20.0–28.0)
Bicarbonate: 21.5 mmol/L (ref 20.0–28.0)
Bicarbonate: 20.4 mmol/L (ref 20.0–28.0)
Calcium, Ion: 1.26 mmol/L (ref 1.15–1.40)
Calcium, Ion: 1.23 mmol/L (ref 1.15–1.40)
Calcium, Ion: 1.21 mmol/L (ref 1.15–1.40)
Calcium, Ion: 1.19 mmol/L (ref 1.15–1.40)
HCT: 31 % — ABNORMAL LOW (ref 39.0–52.0)
HCT: 29 % — ABNORMAL LOW (ref 39.0–52.0)
HCT: 28 % — ABNORMAL LOW (ref 39.0–52.0)
HCT: 29 % — ABNORMAL LOW (ref 39.0–52.0)
Hemoglobin: 10.5 g/dL — ABNORMAL LOW (ref 13.0–17.0)
Hemoglobin: 9.9 g/dL — ABNORMAL LOW (ref 13.0–17.0)
Hemoglobin: 9.5 g/dL — ABNORMAL LOW (ref 13.0–17.0)
Hemoglobin: 9.9 g/dL — ABNORMAL LOW (ref 13.0–17.0)
O2 Saturation: 88 %
O2 Saturation: 91 %
O2 Saturation: 97 %
O2 Saturation: 98 %
Patient temperature: 35.3
Patient temperature: 35.3
Patient temperature: 35.5
Patient temperature: 35.6
Potassium: 5.1 mmol/L (ref 3.5–5.1)
Potassium: 5 mmol/L (ref 3.5–5.1)
Potassium: 4.9 mmol/L (ref 3.5–5.1)
Potassium: 4.5 mmol/L (ref 3.5–5.1)
Sodium: 146 mmol/L — ABNORMAL HIGH (ref 135–145)
Sodium: 146 mmol/L — ABNORMAL HIGH (ref 135–145)
Sodium: 148 mmol/L — ABNORMAL HIGH (ref 135–145)
Sodium: 147 mmol/L — ABNORMAL HIGH (ref 135–145)
TCO2: 21 mmol/L — ABNORMAL LOW (ref 22–32)
TCO2: 20 mmol/L — ABNORMAL LOW (ref 22–32)
TCO2: 23 mmol/L (ref 22–32)
TCO2: 22 mmol/L (ref 22–32)
pCO2 arterial: 60 mmHg — ABNORMAL HIGH (ref 32.0–48.0)
pCO2 arterial: 51.2 mmHg — ABNORMAL HIGH (ref 32.0–48.0)
pCO2 arterial: 48.9 mmHg — ABNORMAL HIGH (ref 32.0–48.0)
pCO2 arterial: 39 mmHg (ref 32.0–48.0)
pH, Arterial: 7.108 — CL (ref 7.350–7.450)
pH, Arterial: 7.158 — CL (ref 7.350–7.450)
pH, Arterial: 7.243 — ABNORMAL LOW (ref 7.350–7.450)
pH, Arterial: 7.32 — ABNORMAL LOW (ref 7.350–7.450)
pO2, Arterial: 67 mmHg — ABNORMAL LOW (ref 83.0–108.0)
pO2, Arterial: 72 mmHg — ABNORMAL LOW (ref 83.0–108.0)
pO2, Arterial: 101 mmHg (ref 83.0–108.0)
pO2, Arterial: 107 mmHg (ref 83.0–108.0)

## 2019-06-25 LAB — LACTIC ACID, PLASMA: Lactic Acid, Venous: 0.4 mmol/L — ABNORMAL LOW (ref 0.5–1.9)

## 2019-06-25 LAB — PROTIME-INR
INR: 2.4 — ABNORMAL HIGH (ref 0.8–1.2)
Prothrombin Time: 25.8 seconds — ABNORMAL HIGH (ref 11.4–15.2)

## 2019-06-25 LAB — CBC
HCT: 33.8 % — ABNORMAL LOW (ref 39.0–52.0)
Hemoglobin: 10 g/dL — ABNORMAL LOW (ref 13.0–17.0)
MCH: 27.9 pg (ref 26.0–34.0)
MCHC: 29.6 g/dL — ABNORMAL LOW (ref 30.0–36.0)
MCV: 94.2 fL (ref 80.0–100.0)
Platelets: 97 10*3/uL — ABNORMAL LOW (ref 150–400)
RBC: 3.59 MIL/uL — ABNORMAL LOW (ref 4.22–5.81)
RDW: 13.9 % (ref 11.5–15.5)
WBC: 15.4 10*3/uL — ABNORMAL HIGH (ref 4.0–10.5)
nRBC: 0.3 % — ABNORMAL HIGH (ref 0.0–0.2)

## 2019-06-25 LAB — COMPREHENSIVE METABOLIC PANEL
ALT: 44 U/L (ref 0–44)
AST: 21 U/L (ref 15–41)
Albumin: 2.7 g/dL — ABNORMAL LOW (ref 3.5–5.0)
Alkaline Phosphatase: 118 U/L (ref 38–126)
Anion gap: 10 (ref 5–15)
BUN: 63 mg/dL — ABNORMAL HIGH (ref 8–23)
CO2: 18 mmol/L — ABNORMAL LOW (ref 22–32)
Calcium: 8 mg/dL — ABNORMAL LOW (ref 8.9–10.3)
Chloride: 116 mmol/L — ABNORMAL HIGH (ref 98–111)
Creatinine, Ser: 2.03 mg/dL — ABNORMAL HIGH (ref 0.61–1.24)
GFR calc Af Amer: 35 mL/min — ABNORMAL LOW (ref >60)
GFR calc non Af Amer: 30 mL/min — ABNORMAL LOW (ref >60)
Glucose, Bld: 44 mg/dL — CL (ref 70–99)
Potassium: 5.1 mmol/L (ref 3.5–5.1)
Sodium: 144 mmol/L (ref 135–145)
Total Bilirubin: 0.5 mg/dL (ref 0.3–1.2)
Total Protein: 5.8 g/dL — ABNORMAL LOW (ref 6.5–8.1)

## 2019-06-25 LAB — GLUCOSE, CAPILLARY
Glucose-Capillary: 39 mg/dL — CL (ref 70–99)
Glucose-Capillary: 120 mg/dL — ABNORMAL HIGH (ref 70–99)
Glucose-Capillary: 86 mg/dL (ref 70–99)
Glucose-Capillary: 143 mg/dL — ABNORMAL HIGH (ref 70–99)
Glucose-Capillary: 53 mg/dL — ABNORMAL LOW (ref 70–99)
Glucose-Capillary: 69 mg/dL — ABNORMAL LOW (ref 70–99)
Glucose-Capillary: 104 mg/dL — ABNORMAL HIGH (ref 70–99)
Glucose-Capillary: 126 mg/dL — ABNORMAL HIGH (ref 70–99)

## 2019-06-25 LAB — BASIC METABOLIC PANEL
Anion gap: 8 (ref 5–15)
BUN: 62 mg/dL — ABNORMAL HIGH (ref 8–23)
CO2: 20 mmol/L — ABNORMAL LOW (ref 22–32)
Calcium: 7.8 mg/dL — ABNORMAL LOW (ref 8.9–10.3)
Chloride: 114 mmol/L — ABNORMAL HIGH (ref 98–111)
Creatinine, Ser: 1.88 mg/dL — ABNORMAL HIGH (ref 0.61–1.24)
GFR calc Af Amer: 39 mL/min — ABNORMAL LOW (ref >60)
GFR calc non Af Amer: 33 mL/min — ABNORMAL LOW (ref >60)
Glucose, Bld: 127 mg/dL — ABNORMAL HIGH (ref 70–99)
Potassium: 4.7 mmol/L (ref 3.5–5.1)
Sodium: 142 mmol/L (ref 135–145)

## 2019-06-25 LAB — ECHOCARDIOGRAM COMPLETE
Height: 68 in
Weight: 4603.2 oz

## 2019-06-25 LAB — HEMOGLOBIN A1C
Hgb A1c MFr Bld: 8.3 % — ABNORMAL HIGH (ref 4.8–5.6)
Mean Plasma Glucose: 191.51 mg/dL

## 2019-06-25 LAB — TSH: TSH: 3.35 u[IU]/mL (ref 0.350–4.500)

## 2019-06-25 MED ORDER — DEXTROSE 50 % IV SOLN
INTRAVENOUS | Status: AC
  Filled 2019-06-25: qty 50

## 2019-06-25 MED ORDER — INSULIN ASPART 100 UNIT/ML ~~LOC~~ SOLN: 0.0000 [IU] | SUBCUTANEOUS | Status: DC

## 2019-06-25 MED ORDER — VITAL HIGH PROTEIN PO LIQD: 1000.0000 mL | ORAL | Status: DC

## 2019-06-25 MED ORDER — FAMOTIDINE 40 MG/5ML PO SUSR
20.0000 mg | Freq: Every day | ORAL | Status: DC
  Administered 2019-06-25 – 2019-06-27 (×3): 20 mg
  Filled 2019-06-25 (×3): qty 2.5

## 2019-06-25 MED ORDER — FENTANYL CITRATE (PF) 100 MCG/2ML IJ SOLN
25.0000 ug | INTRAMUSCULAR | Status: DC | PRN
  Administered 2019-06-25 (×2): 25 ug via INTRAVENOUS

## 2019-06-25 MED ORDER — ROCURONIUM BROMIDE 50 MG/5ML IV SOLN
100.0000 mg | Freq: Once | INTRAVENOUS | Status: DC
  Filled 2019-06-25: qty 10

## 2019-06-25 MED ORDER — FUROSEMIDE 10 MG/ML IJ SOLN
40.0000 mg | Freq: Once | INTRAMUSCULAR | Status: AC
  Administered 2019-06-25: 40 mg via INTRAVENOUS
  Filled 2019-06-25: qty 4

## 2019-06-25 MED ORDER — ETOMIDATE 2 MG/ML IV SOLN
40.0000 mg | Freq: Once | INTRAVENOUS | Status: AC
  Administered 2019-06-25: 40 mg via INTRAVENOUS

## 2019-06-25 MED ORDER — DEXTROSE 50 % IV SOLN
25.0000 g | INTRAVENOUS | Status: AC
  Administered 2019-06-25: 25 g via INTRAVENOUS

## 2019-06-25 MED ORDER — SODIUM BICARBONATE-DEXTROSE 150-5 MEQ/L-% IV SOLN
150.0000 meq | INTRAVENOUS | Status: DC
  Administered 2019-06-25 – 2019-06-26 (×2): 150 meq via INTRAVENOUS
  Filled 2019-06-25 (×2): qty 1000

## 2019-06-25 MED ORDER — ORAL CARE MOUTH RINSE
15.0000 mL | OROMUCOSAL | Status: DC
  Administered 2019-06-25 – 2019-06-27 (×21): 15 mL via OROMUCOSAL

## 2019-06-25 MED ORDER — DEXTROSE 10 % IV SOLN: INTRAVENOUS | Status: DC

## 2019-06-25 MED ORDER — LEVETIRACETAM 250 MG PO TABS
750.0000 mg | ORAL_TABLET | Freq: Two times a day (BID) | ORAL | Status: AC
  Administered 2019-06-25: 750 mg via ORAL

## 2019-06-25 MED ORDER — PRO-STAT SUGAR FREE PO LIQD
60.0000 mL | Freq: Two times a day (BID) | ORAL | Status: DC
  Administered 2019-06-25 – 2019-06-27 (×4): 60 mL
  Filled 2019-06-25 (×4): qty 60

## 2019-06-25 MED ORDER — DEXMEDETOMIDINE HCL IN NACL 400 MCG/100ML IV SOLN
0.0000 ug/kg/h | INTRAVENOUS | Status: DC
  Administered 2019-06-25: 0.2 ug/kg/h via INTRAVENOUS
  Administered 2019-06-25: 0.7 ug/kg/h via INTRAVENOUS
  Administered 2019-06-26 (×2): 2 ug/kg/h via INTRAVENOUS
  Filled 2019-06-25 (×6): qty 100

## 2019-06-25 MED ORDER — SODIUM BICARBONATE 8.4 % IV SOLN
INTRAVENOUS | Status: AC
  Filled 2019-06-25: qty 50

## 2019-06-25 MED ORDER — SUCCINYLCHOLINE CHLORIDE 20 MG/ML IJ SOLN
200.0000 mg | Freq: Once | INTRAMUSCULAR | Status: AC
  Administered 2019-06-25: 200 mg via INTRAVENOUS
  Filled 2019-06-25: qty 10

## 2019-06-25 MED ORDER — VITAL AF 1.2 CAL PO LIQD
1000.0000 mL | ORAL | Status: DC
  Administered 2019-06-25 – 2019-06-27 (×3): 1000 mL

## 2019-06-25 MED ORDER — LORATADINE 10 MG PO TABS
10.0000 mg | ORAL_TABLET | Freq: Every day | ORAL | Status: DC
  Administered 2019-06-25: 10 mg

## 2019-06-25 MED ORDER — WARFARIN SODIUM 2 MG PO TABS
2.0000 mg | ORAL_TABLET | Freq: Once | ORAL | Status: AC
  Administered 2019-06-25: 2 mg
  Filled 2019-06-25: qty 1

## 2019-06-25 MED ORDER — SODIUM BICARBONATE 8.4 % IV SOLN
100.0000 meq | Freq: Once | INTRAVENOUS | Status: AC
  Administered 2019-06-25: 100 meq via INTRAVENOUS
  Filled 2019-06-25: qty 50

## 2019-06-25 MED ORDER — CHLORHEXIDINE GLUCONATE 0.12% ORAL RINSE (MEDLINE KIT)
15.0000 mL | Freq: Two times a day (BID) | OROMUCOSAL | Status: DC
  Administered 2019-06-25 – 2019-06-27 (×4): 15 mL via OROMUCOSAL

## 2019-06-25 MED ORDER — HYDROCORTISONE NA SUCCINATE PF 100 MG IJ SOLR
50.0000 mg | Freq: Four times a day (QID) | INTRAMUSCULAR | Status: DC
  Administered 2019-06-25 – 2019-06-26 (×4): 50 mg via INTRAVENOUS
  Filled 2019-06-25 (×5): qty 2

## 2019-06-25 MED ORDER — FENTANYL CITRATE (PF) 100 MCG/2ML IJ SOLN
25.0000 ug | INTRAMUSCULAR | Status: DC | PRN
  Administered 2019-06-25 (×4): 100 ug via INTRAVENOUS
  Administered 2019-06-25: 50 ug via INTRAVENOUS
  Administered 2019-06-25 (×5): 100 ug via INTRAVENOUS
  Administered 2019-06-26: 50 ug via INTRAVENOUS
  Administered 2019-06-26: 100 ug via INTRAVENOUS
  Administered 2019-06-27: 50 ug via INTRAVENOUS
  Administered 2019-06-27: 100 ug via INTRAVENOUS
  Administered 2019-06-27: 50 ug via INTRAVENOUS
  Filled 2019-06-25 (×11): qty 2

## 2019-06-25 MED ORDER — DEXTROSE 50 % IV SOLN: 50.0000 mL | Freq: Once | INTRAVENOUS | Status: AC

## 2019-06-25 MED ORDER — PERFLUTREN LIPID MICROSPHERE
1.0000 mL | INTRAVENOUS | Status: AC | PRN
  Administered 2019-06-25: 2 mL via INTRAVENOUS
  Filled 2019-06-25: qty 10

## 2019-06-25 MED ORDER — PRO-STAT SUGAR FREE PO LIQD
30.0000 mL | Freq: Two times a day (BID) | ORAL | Status: DC
  Administered 2019-06-25: 30 mL
  Filled 2019-06-25: qty 30

## 2019-06-25 MED ORDER — GABAPENTIN 250 MG/5ML PO SOLN
100.0000 mg | Freq: Two times a day (BID) | ORAL | Status: DC
  Administered 2019-06-25 – 2019-06-27 (×5): 100 mg
  Filled 2019-06-25 (×7): qty 2

## 2019-06-25 MED ORDER — LEVETIRACETAM 100 MG/ML PO SOLN
750.0000 mg | Freq: Two times a day (BID) | ORAL | Status: DC
  Administered 2019-06-25 – 2019-06-27 (×4): 750 mg
  Filled 2019-06-25 (×6): qty 7.5

## 2019-06-25 MED ORDER — VANCOMYCIN HCL 1500 MG/300ML IV SOLN: 1500.0000 mg | INTRAVENOUS | Status: DC

## 2019-06-25 MED ORDER — FENTANYL CITRATE (PF) 100 MCG/2ML IJ SOLN
50.0000 ug | Freq: Once | INTRAMUSCULAR | Status: AC
  Administered 2019-06-25: 50 ug via INTRAVENOUS
  Filled 2019-06-25: qty 2

## 2019-06-25 NOTE — Progress Notes (Signed)
eLink Physician-Brief Progress Note Patient Name: Noah [name] DOB: 1940/05/25 MRN: [id number]   Date of Service  06/25/2019  HPI/Events of Note  Hypoglycemia - Blood glucose = 44. Patient given D50.  eICU Interventions  Will order: 1. D10W to run IV at 30 mL/hour.      Intervention Category Major Interventions: Other:  [name] 06/25/2019, 4:37 AM

## 2019-06-25 NOTE — Progress Notes (Addendum)
PCCM Interval Progress note:   Paged to evaluate patient for increasing lethargy, ABG 7.108/60/67/19 now on Bipap.  Pt was admitted with hypotension of unclear etiology, though suspected cardiogenic in etiology.   He received Lasix 40mg , Echo pending, continued empiric Vanc and ceftriaxone.  Remains on Levophed .  Remains on home Neurontin, but no other sedating medications  On evaluation, pt is arousable, follows commands but answers questions with one word answers and is not oriented.  No respiratory distress. On Bipap 18/6 30% FiO2.  Belly soft and no obvious discomfort on palpation.  Plan: -stat CXR, CMP, CBC and lactic acid -repeat ABG after one hour on Bipap, he is at risk for intubation -If neuro exam is worsening consider head CT as he is on coumadin  5:22 AM Repeat ABG: 7.158/51.2/72/18.6 CXR with persistent increased interstitial marking, edema still difficult to exclude, give Lasix 40mg  -start isotonic bicarb gtt @50cc /hr, discussed with PCCM attending  -intubate and obtain CT head -Called wife to update of patient's change in status   04-28-1996 R Abygail Galeno, PA-C

## 2019-06-25 NOTE — Progress Notes (Signed)
eLink Physician-Brief Progress Note Patient Name: Noah [name] DOB: [birth year] MRN: [id number]   Date of Service  06/25/2019  HPI/Events of Note  Agitation - Currently on a Precedex IV infusoin with Fentanyl IV PRN.   eICU Interventions  Will increase the ceiling on Precedex IV infusion to 1.6 mcg/kg/min.     Intervention Category Major Interventions: Delirium, psychosis, severe agitation - evaluation and management  Roxana Lai Eugene 06/25/2019, 11:37 PM

## 2019-06-25 NOTE — Progress Notes (Signed)
  Echocardiogram 2D Echocardiogram has been performed.  Noah [name] 06/25/2019, 10:03 AM

## 2019-06-25 NOTE — Procedures (Addendum)
Intubation Procedure Note BADR PIEDRA [id number] 1940/06/26  Procedure: Intubation Indications: Airway protection and maintenance  Procedure Details Consent: Unable to obtain consent because of altered level of consciousness. Time Out: Verified patient identification, verified procedure, site/side was marked, verified correct patient position, special equipment/implants available, medications/allergies/relevent history reviewed, required imaging and test results available.  Performed  Maximum sterile technique was used including gloves, hand hygiene and mask.  4    Evaluation Hemodynamic Status: BP stable throughout; O2 sats: stable throughout Patient's Current Condition: stable Complications: No apparent complications Patient did tolerate procedure well. Chest X-ray ordered to verify placement.  CXR: pending.   Darcella Gasman Happy Begeman 06/25/2019

## 2019-06-25 NOTE — Progress Notes (Signed)
Pharmacy Antibiotic Note  Noah [name] is a 79 y.o. male admitted on 06/23/2019 with unknown source.  Pharmacy has been consulted for Vancomycin and cefepime dosing. Cr continues rising, pt required intubation overnight.  Plan: -Reduce vancomycin to 1500mg  IV q48h - est AUC 405 -Continue cefepime 2g IV q12h  Height: 5\' 8"  (172.7 cm) Weight: 287 lb 11.2 oz (130.5 kg) IBW/kg (Calculated) : 68.4  Temp (24hrs), Avg:95.8 F (35.4 C), Min:94.8 F (34.9 C), Max:97.3 F (36.3 C)  Recent Labs  Lab 06/23/19 1552 06/23/19 1727 06/23/19 1903 06/23/19 2125 06/24/19 0304 06/24/19 0312 06/25/19 0308 06/25/19 0314  WBC 9.7  --   --   --  12.1*  --  15.4*  --   CREATININE 1.65*  --   --   --  1.72*  --  2.03*  --   LATICACIDVEN  --  0.7 0.6 0.7  --  0.5  --  0.4*    Estimated Creatinine Clearance: 39.5 mL/min (A) (by C-G formula based on SCr of 2.03 mg/dL (H)).    Allergies  Allergen Reactions  . Codeine Other (See Comments)    constipation  . Erythromycin-Sulfisoxazole Other (See Comments)    Causes infection in throat and eyes    Antimicrobials this admission: Vancomycin 3/6 >>  Cefepime 3/6 >>   Microbiology results: 3/6 BCx: pending 3/6 UCx: pending 3/6 MRSA PCR: negative  08/25/19, PharmD, BCPS Clinical Pharmacist [phone] Please check AMION for all [facility] Pharmacy numbers 06/25/2019

## 2019-06-25 NOTE — Progress Notes (Signed)
Placed patient on BIPAP per ABG results/MD order.  Will repeat ABG in an hour.

## 2019-06-25 NOTE — Progress Notes (Signed)
Hypoglycemic Event  CBG: 69  Treatment: D50 50 mL (25 gm)  Symptoms: pale  Follow-up CBG: Time:1215 CBG Result:143  Possible Reasons for Event: Other: pt is npo  Comments/MD notified:followed hypoglycemia protocol    Noah [name]

## 2019-06-25 NOTE — Progress Notes (Addendum)
Upon AM assessment  RN noted R pupil to be irregular, sluggish, and 4+ and L pupil to be round, sluggish, and  2+. As noted by PM RN pupils had previously been equal, round, and and brisk at midnight. Full neuro exam completed and MD made aware of change. Patient opens eyes to command and moves all extremities purposefully.    MD at bedside to perform exam.    RN instructed not to call code stroke but to get previously ordered head CT as soon as possible. RN coordinated with respiratory and CTstaff to get head CT as soon as possible. RN presently waiting on transport staff to arrive.

## 2019-06-25 NOTE — Progress Notes (Signed)
Hypoglycemic Event  CBG: 44  Treatment: D50 50 mL (25 gm)  Symptoms: Shaky  Follow-up CBG: [id number] CBG Result:120  Possible Reasons for Event: Other: acidosis  Comments/MD notified: elink    Noah [name] Noah [name]

## 2019-06-25 NOTE — Progress Notes (Signed)
RN made MD aware of repeat ABG following intubation. No new orders at this time. MD noted we will adjust respiratory rate after returning from head CT to assist in correcting acidosis.

## 2019-06-25 NOTE — Progress Notes (Signed)
NAME:  Noah [name], MRN:  [id number], DOB:  28-Jul-1940, LOS: 1 ADMISSION DATE:  06/23/2019, CONSULTATION DATE: 06/24/2019 REFERRING MD:  Fredia Sorrow, MD CHIEF COMPLAINT:  SOB    Brief History   79 y/o M who presented to [facility] on 3/6 with reports of SOB.   The patient was admitted Jan 2021 for encephalopathy thought secondary to volume depletion in the setting of antihypertensives and discharge to a SNF.  Apparently was noted to have AF 3/5 and contacted by an Therapist, sports for phone encounter.  During that call he reported trouble walking and was disoriented compared to baseline.  EMS activated with ER evaluation 3/6 found him to be hypotension and hypothermia. He was treated as sepsis with IVF, empiric abx and levophed. Transferred to [facility] 3/6. Lactic acid remained <2, PCT negative. Patient developed worsening lethargy, hypercarbia am 3/8 requiring intubation.   Past Medical History  CAD s/p PCI in 2008 with DES  Ischemic cardiomyopathy, combined systolic and diastolic dysfunction, s/p BiV ICD A-fib on Coumadin HTN HLD DM GERD OSA on 3L O2  CKD Stage 3  [facility] - 2020, on [facility]   3/06 Admit  3/08 Intubated for hypercarbic respiratory failure  Consults:     Procedures:  ETT 3/8 >>  R Radial ALine 3/7 >>  Significant Diagnostic Tests:   ECHO 3/8 >> LVEF 35-40%, technically difficult study, inferoseptal, inferolateral and apical hypokinesis, overall moderate LV dysfunction, mild MR, RWMA noted in LV, RV systolic function normal, moderately elevated PA systolic pressure  CT Head 3/8 >> mild chronic ischemic white matter disease, no acute abnormality   Micro Data:  RVP 3/6 >> negative  COVID 3/6 >> negative  Influenza A/B 3/6 >> negative  BCx2 3/6 >>  UC 3/6 >>   Antimicrobials:  Vanco 3/6 >>  Cefepime 3/6 >>     Subjective   RN reports concerns this am for irregular / asymmetrical pupils Hypothermia > slowly rewarmed to 96 Multiple episodes of  hypoglycemia, 44, 69, 86 Intubated early am for hypercarbic respiratory failure   Objective   Blood pressure 123/67, pulse 72, temperature (!) 96.1 F (35.6 C), resp. rate (!) 22, height 5\' 8"  (1.727 m), weight 130.5 kg, SpO2 99 %.    Vent Mode: PRVC FiO2 (%):  [40 %] 40 % Set Rate:  [18 bmp] 18 bmp Vt Set:  [540 mL] 540 mL PEEP:  [5 cmH20] 5 cmH20 Plateau Pressure:  [15 cmH20-22 cmH20] 22 cmH20   Intake/Output Summary (Last 24 hours) at 06/25/2019 1246 Last data filed at 06/25/2019 1100 Gross per 24 hour  Intake 910 ml  Output 1700 ml  Net -790 ml   Filed Weights   06/23/19 1531 06/24/19 0500 06/25/19 0500  Weight: 123.4 kg 129.2 kg 130.5 kg    Examination: General: chronically ill appearing elderly male lying in bed on vent in NAD HEENT: MM pink/moist, ETT, R pupil irregular shaped, left pinpoint Neuro: sedate on precedex  CV: s1s2 regular, SR with wide complex in 60's on monitor, no m/r/g PULM:  Non-labored on vent, lungs bilaterally diminished  GI: soft, bsx4 active  Extremities: warm/dry, 1+ BLE pitting edema  Skin: no rashes.  Area of ecchymosis noted on right upper arm.   Assessment & Plan:   Hypotension Etiology unclear, low index of suspicion for septic shock with negative PCT and flat lactic acid. UA negative.  CXR without infiltrate. Hx of combined systolic / diastolic dysfunction with ICM, AF with  recent events prior to admit.  Question component of cardiogenic vs adrenal insufficiency with cortisol low on admit (11.3).  ECHO 3/8 with LVEF 35-40%,slight reduction from prior 40-45% -begin stress dose steroids  -levophed for MAP >65 -ICU monitoring   -continue aline monitoring for now -ECHO as above   Cardiogenic Pulmonary Edema Stable Left Pleural Effusion  CXR with stable left pleural effusion, mild edema  -follow on CXR  -diuresis when BP permits   Acute Metabolic Encephalopathy Suspect multifactorial in the setting of metabolic acidosis, hypercarbia, &  hypoglycemia.  CT Head negative -minimize sedating medications as able -RASS goal 0 to -1  -PAD protocol with precedex, PRN fentanyl  CAD s/p PCI in 2008 with DES  Ischemic cardiomyopathy, combined systolic and diastolic dysfunction, s/p BiV ICD A-fib on Coumadin HTN HLD -continue coumadin per pharmacy  -follow INR  OSA  3L O2 at baseline -will need CPAP post extubation   AKI on CKD III Non gap Metabolic Acidosis  Baseline Cr 1.4 CKD with 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 bicarbonate loss -assess urine sodium, creatinine -Trend BMP / urinary output -Replace electrolytes as indicated -Avoid nephrotoxic agents, ensure adequate renal perfusion  DM  Hypoglycemia  -D10 infusion at 30 ml/hr unit glucose consistently > 100 -begin TF  -stop lantus 60 units -follow trend for need of addition of SSI   At Risk Malnutrition  -TF per Nutrition   Best practice:  Diet: NPO, TF  Pain/Anxiety/Delirium protocol (if indicated): precedex  VAP protocol (if indicated): in place  DVT prophylaxis: Coumadin for Afib  GI prophylaxis: PPI  Glucose control: as above Mobility: Bedrest Code Status: Full Code Family Communication: Wife Jayleen Afonso  [phone] called 3/8 for update, message left for return call Disposition: ICU  Labs   CBC: Recent Labs  Lab 06/23/19 1552 06/23/19 1552 06/24/19 0304 06/24/19 0304 06/25/19 0215 06/25/19 0308 06/25/19 0434 06/25/19 0753 06/25/19 1125  WBC 9.7  --  12.1*  --   --  15.4*  --   --   --   NEUTROABS 8.2*  --   --   --   --   --   --   --   --   HGB 11.2*   < > 11.2*   < > 10.5* 10.0* 9.9* 9.5* 9.9*  HCT 37.5*   < > 38.3*   < > 31.0* 33.8* 29.0* 28.0* 29.0*  MCV 93.1  --  94.6  --   --  94.2  --   --   --   PLT 86*  --  PLATELET CLUMPS NOTED ON SMEAR, COUNT APPEARS DECREASED  --   --  97*  --   --   --    < > = values in this interval not displayed.    Basic Metabolic Panel: Recent Labs  Lab 06/23/19 1552 06/23/19 1552 06/24/19 0304 06/24/19 0304  06/24/19 0351 06/25/19 0215 06/25/19 0308 06/25/19 0434 06/25/19 0753 06/25/19 1125  NA 141   < > 145   < >  --  146* 144 146* 148* 147*  K 5.1   < > 5.5*   < >  --  5.1 5.1 5.0 4.9 4.5  CL 111  --  117*  --   --   --  116*  --   --   --   CO2 24  --  19*  --   --   --  18*  --   --   --   GLUCOSE 159*  --  64*  --   --   --  44*  --   --   --   BUN 66*  --  62*  --   --   --  63*  --   --   --   CREATININE 1.65*  --  1.72*  --   --   --  2.03*  --   --   --   CALCIUM 8.6*  --  8.3*  --   --   --  8.0*  --   --   --   MG  --   --   --   --  1.9  --   --   --   --   --   PHOS  --   --   --   --  6.0*  --   --   --   --   --    < > = values in this interval not displayed.   GFR: Estimated Creatinine Clearance: 39.5 mL/min (A) (by C-G formula based on SCr of 2.03 mg/dL (H)). Recent Labs  Lab 06/23/19 1552 06/23/19 1727 06/23/19 1903 06/23/19 2125 06/24/19 0304 06/24/19 0312 06/25/19 0308 06/25/19 0314  PROCALCITON  --   --   --   --  <0.10  --   --   --   WBC 9.7  --   --   --  12.1*  --  15.4*  --   LATICACIDVEN  --    < > 0.6 0.7  --  0.5  --  0.4*   < > = values in this interval not displayed.    Liver Function Tests: Recent Labs  Lab 06/23/19 1727 06/24/19 0304 06/25/19 0308  AST 39 35 21  ALT 54* 57* 44  ALKPHOS 101 115 118  BILITOT 0.6 0.7 0.5  PROT 6.0* 6.2* 5.8*  ALBUMIN 3.0* 3.0* 2.7*   No results for input(s): LIPASE, AMYLASE in the last 168 hours. No results for input(s): AMMONIA in the last 168 hours.  ABG    Component Value Date/Time   PHART 7.320 (L) 06/25/2019 1125   PCO2ART 39.0 06/25/2019 1125   PO2ART 107.0 06/25/2019 1125   HCO3 20.4 06/25/2019 1125   TCO2 22 06/25/2019 1125   ACIDBASEDEF 6.0 (H) 06/25/2019 1125   O2SAT 98.0 06/25/2019 1125     Coagulation Profile: Recent Labs  Lab 06/23/19 1727 06/24/19 1150 06/25/19 0308  INR 1.2 1.7* 2.4*    Cardiac Enzymes: No results for input(s): CKTOTAL, CKMB, CKMBINDEX, TROPONINI in  the last 168 hours.  HbA1C: Hgb A1c MFr Bld  Date/Time Value Ref Range Status  03/28/2019 08:19 PM 8.7 (H) 4.8 - 5.6 % Final    Comment:    (NOTE) Pre diabetes:          5.7%-6.4% Diabetes:              >6.4% Glycemic control for   <7.0% adults with diabetes   05/19/2018 06:07 AM 9.5 (H) 4.8 - 5.6 % Final    Comment:    (NOTE) Pre diabetes:          5.7%-6.4% Diabetes:              >6.4% Glycemic control for   <7.0% adults with diabetes     CBG: Recent Labs  Lab 06/25/19 0424 06/25/19 0811 06/25/19 1150 06/25/19 1153 06/25/19 1225  GLUCAP 120* 86 53* 69* 143*      Critical care time:  55 mins

## 2019-06-25 NOTE — Progress Notes (Signed)
eLink Physician-Brief Progress Note Patient Name: Noah [name] DOB: 08-05-1940 MRN: [id number]   Date of Service  06/25/2019  HPI/Events of Note  ABG on BiPAP = 7.158/51.2/72.0/18.6. LOC is worse. Intubation?  eICU Interventions  Will order: 1. NaHCO3 100 meq IV now.  2. Will notify ground team to reassess at bedside.      Intervention Category Major Interventions: Acid-Base disturbance - evaluation and management;Respiratory failure - evaluation and management  [name] 06/25/2019, 5:35 AM

## 2019-06-25 NOTE — Progress Notes (Signed)
Initial Nutrition Assessment  DOCUMENTATION CODES:   Obesity unspecified  INTERVENTION:   Tube feeding:  -Vital AF 1.2 @ 50 ml/hr via OGT (1200 ml) -60 ml Prostat BID  Provides: 1840 kcals, 150 grams protein, 973 ml free water.   NUTRITION DIAGNOSIS:   Increased nutrient needs related to acute illness as evidenced by estimated needs  GOAL:   Patient will meet greater than or equal to 90% of their needs  MONITOR:   Weight trends, Labs, I & O's, TF tolerance, Vent status, Diet advancement  REASON FOR ASSESSMENT:   Consult Enteral/tube feeding initiation and management  ASSESSMENT:   Patient with PMH significant for CAD s/p PCI, ischemic cardiomyopathy, CHF, s/p BiV ICD, DM, HTN, GERD, HLD, and CKD III. Presents this admission with hypertension from unclear etiology.   Pt intubated early this am. Having hypoglycemic episodes. Started on D10. Slightly hypernatremic, may consider addition of free water flushes.   Weight shows to fluctuate throughout the year. Likely related to fluid fluctuation give history of CHF. Utilize 129.2 kg as EDW for now.   Patient is currently intubated on ventilator support MV: 9.4 L/min Temp (24hrs), Avg:95.9 F (35.5 C), Min:95.4 F (35.2 C), Max:97.3 F (36.3 C)   I/O: +3,887 ml since admit UOP: 1,490 ml x 24 hrs   Drips: precedex, D10 @ 30 ml/hr, levophed, sodium bicarb  Medications: solucortef, lantus Labs: Na 148 (H) CBG 64-159  NUTRITION - FOCUSED PHYSICAL EXAM:    Most Recent Value  Orbital Region  No depletion  Upper Arm Region  No depletion  Thoracic and Lumbar Region  Unable to assess  Buccal Region  No depletion  Temple Region  No depletion  Clavicle Bone Region  No depletion  Clavicle and Acromion Bone Region  No depletion  Scapular Bone Region  Unable to assess  Dorsal Hand  No depletion  Patellar Region  Mild depletion  Anterior Thigh Region  No depletion  Posterior Calf Region  Mild depletion  Edema (RD  Assessment)  Mild  Hair  Reviewed  Eyes  Unable to assess  Mouth  Unable to assess  Skin  Reviewed  Nails  Reviewed     Diet Order:   Diet Order            Diet NPO time specified  Diet effective now              EDUCATION NEEDS:   Not appropriate for education at this time  Skin:  Skin Assessment: Skin Integrity Issues: Skin Integrity Issues:: Other (Comment) Other: MASD- abdomen  Last BM:  3/7  Height:   Ht Readings from Last 1 Encounters:  06/23/19 5\' 8"  (1.727 m)    Weight:   Wt Readings from Last 1 Encounters:  06/25/19 130.5 kg    Ideal Body Weight:  70 kg  BMI:  Body mass index is 43.74 kg/m.  Estimated Nutritional Needs:   Kcal:  1421-1809 kcal  Protein:  140-175 grams  Fluid:  >/= 1.4 L/day   08/25/19 RD, LDN Clinical Nutrition Pager listed in AMION

## 2019-06-25 NOTE — Progress Notes (Signed)
ANTICOAGULATION CONSULT NOTE  Pharmacy Consult for Coumadin Indication: atrial fibrillation  Allergies  Allergen Reactions  . Codeine Other (See Comments)    constipation  . Erythromycin-Sulfisoxazole Other (See Comments)    Causes infection in throat and eyes    Patient Measurements: Height: 5\' 8"  (172.7 cm) Weight: 287 lb 11.2 oz (130.5 kg) IBW/kg (Calculated) : 68.4  Vital Signs: Temp: 96.1 F (35.6 C) (03/08 1030) Temp Source: Bladder (03/08 0400) BP: 123/67 (03/08 0645) Pulse Rate: 72 (03/08 1030)  Labs: Recent Labs    06/23/19 1552 06/23/19 1552 06/23/19 1727 06/23/19 1903 06/23/19 2125 06/24/19 0304 06/24/19 1150 06/25/19 0215 06/25/19 0308 06/25/19 0308 06/25/19 0434 06/25/19 0753  HGB 11.2*   < >  --   --   --  11.2*  --    < > 10.0*   < > 9.9* 9.5*  HCT 37.5*   < >  --   --   --  38.3*  --    < > 33.8*  --  29.0* 28.0*  PLT 86*  --   --   --   --  PLATELET CLUMPS NOTED ON SMEAR, COUNT APPEARS DECREASED  --   --  97*  --   --   --   APTT  --   --  48*  --   --   --   --   --   --   --   --   --   LABPROT  --   --  15.4*  --   --   --  19.9*  --  25.8*  --   --   --   INR  --   --  1.2  --   --   --  1.7*  --  2.4*  --   --   --   CREATININE 1.65*  --   --   --   --  1.72*  --   --  2.03*  --   --   --   TROPONINIHS  --   --   --  6 6  --   --   --   --   --   --   --    < > = values in this interval not displayed.    Estimated Creatinine Clearance: 39.5 mL/min (A) (by C-G formula based on SCr of 2.03 mg/dL (H)).   Medical History: Past Medical History:  Diagnosis Date  . CAD S/P percutaneous coronary angioplasty    remote PCIs in 1990's- last CFX PCI 2008  . Chronic kidney disease, stage III (moderate)   . Diabetes mellitus with complication (HCC)    with hyperglycemia and diabetic autonomic poly neuropathy  . Gastro-esophageal reflux disease with esophagitis   . Hyperlipidemia   . Hypertension   . Ischemic cardiomyopathy 01/20/2018   St Jude  ICD   . Morbid obesity (HCC)   . Obstructive sleep apnea    on CPAP  . Polyneuropathy   . Primary insomnia     Assessment: 79yo male presented to [facility] ED c/o SOB, concern for pulmonary edema, tx'd to [facility] for further evaluation, to continue Coumadin for Afib.  INR this morning therapeutic at 2.4, CBC and LFTs stable.  *PTA dose = 5mg  TTS, 2.5mg  AODs  Goal of Therapy:  INR 2-3   Plan:  -Warfarin 2mg  PO x1 tonight -Daily protime  [facility], PharmD, BCPS Clinical Pharmacist [phone] Please check AMION for all [facility] numbers  06/25/2019  

## 2019-06-25 NOTE — Progress Notes (Signed)
eLink Physician-Brief Progress Note Patient Name: Noah [name] DOB: 15-Dec-1940 MRN: [id number]   Date of Service  06/25/2019  HPI/Events of Note  ABG on 3 L/min [place] O2 = 7.108/60/67/19. He is not on any narcotics or sedatives.  eICU Interventions  Will order: 1. Trial of BIPAP to see if his mental status improves.  2. Repeat ABG at  3:30 AM. 3. Will ask ground team to evaluate him at bedside.      Intervention Category Major Interventions: Acid-Base disturbance - evaluation and management  Sarahelizabeth Conway Eugene 06/25/2019, 2:29 AM

## 2019-06-25 NOTE — Progress Notes (Signed)
RT Note:  On  previous visit to room patient states he's awake and doesn't wear CPAP.  RN currently at bedside patient states he does wear CPAP. Mental status unclear. Per RN there is some confusion with patient, therefore CPAP [place]. Patient is arousable, Vitals stable. Spo2 96% on 2L [place].

## 2019-06-25 NOTE — Progress Notes (Signed)
eLink Physician-Brief Progress Note Patient Name: Noah [name] DOB: [birth year] MRN: [id number]   Date of Service  06/25/2019  HPI/Events of Note  Patient more lethargic. Refused to wear nocturnal CPAP.  eICU Interventions  Will check ABG now.      Intervention Category Major Interventions: Change in mental status - evaluation and management  Eddye Broxterman Eugene 06/25/2019, 2:08 AM

## 2019-06-26 ENCOUNTER — Inpatient Hospital Stay (HOSPITAL_COMMUNITY): Payer: Medicare HMO

## 2019-06-26 ENCOUNTER — Inpatient Hospital Stay: Payer: Self-pay

## 2019-06-26 DIAGNOSIS — J9601 Acute respiratory failure with hypoxia: Secondary | ICD-10-CM

## 2019-06-26 DIAGNOSIS — N179 Acute kidney failure, unspecified: Secondary | ICD-10-CM

## 2019-06-26 LAB — CBC WITH DIFFERENTIAL/PLATELET
Abs Immature Granulocytes: 0.05 10*3/uL (ref 0.00–0.07)
Basophils Absolute: 0 10*3/uL (ref 0.0–0.1)
Basophils Relative: 0 %
Eosinophils Absolute: 0 10*3/uL (ref 0.0–0.5)
Eosinophils Relative: 0 %
HCT: 33.4 % — ABNORMAL LOW (ref 39.0–52.0)
Hemoglobin: 10.2 g/dL — ABNORMAL LOW (ref 13.0–17.0)
Immature Granulocytes: 1 %
Lymphocytes Relative: 4 %
Lymphs Abs: 0.4 10*3/uL — ABNORMAL LOW (ref 0.7–4.0)
MCH: 27.5 pg (ref 26.0–34.0)
MCHC: 30.5 g/dL (ref 30.0–36.0)
MCV: 90 fL (ref 80.0–100.0)
Monocytes Absolute: 0.4 10*3/uL (ref 0.1–1.0)
Monocytes Relative: 4 %
Neutro Abs: 9.9 10*3/uL — ABNORMAL HIGH (ref 1.7–7.7)
Neutrophils Relative %: 91 %
Platelets: 87 10*3/uL — ABNORMAL LOW (ref 150–400)
RBC: 3.71 MIL/uL — ABNORMAL LOW (ref 4.22–5.81)
RDW: 14.1 % (ref 11.5–15.5)
WBC: 10.7 10*3/uL — ABNORMAL HIGH (ref 4.0–10.5)
nRBC: 0.3 % — ABNORMAL HIGH (ref 0.0–0.2)

## 2019-06-26 LAB — CBC
HCT: 33.2 % — ABNORMAL LOW (ref 39.0–52.0)
Hemoglobin: 10.1 g/dL — ABNORMAL LOW (ref 13.0–17.0)
MCH: 27.6 pg (ref 26.0–34.0)
MCHC: 30.4 g/dL (ref 30.0–36.0)
MCV: 90.7 fL (ref 80.0–100.0)
Platelets: 87 10*3/uL — ABNORMAL LOW (ref 150–400)
RBC: 3.66 MIL/uL — ABNORMAL LOW (ref 4.22–5.81)
RDW: 14.2 % (ref 11.5–15.5)
WBC: 10.8 10*3/uL — ABNORMAL HIGH (ref 4.0–10.5)
nRBC: 0.2 % (ref 0.0–0.2)

## 2019-06-26 LAB — LACTIC ACID, PLASMA: Lactic Acid, Venous: 0.6 mmol/L (ref 0.5–1.9)

## 2019-06-26 LAB — SODIUM, URINE, RANDOM: Sodium, Ur: 64 mmol/L

## 2019-06-26 LAB — POCT I-STAT 7, (LYTES, BLD GAS, ICA,H+H)
Acid-base deficit: 4 mmol/L — ABNORMAL HIGH (ref 0.0–2.0)
Bicarbonate: 21.4 mmol/L (ref 20.0–28.0)
Calcium, Ion: 1.21 mmol/L (ref 1.15–1.40)
HCT: 32 % — ABNORMAL LOW (ref 39.0–52.0)
Hemoglobin: 10.9 g/dL — ABNORMAL LOW (ref 13.0–17.0)
O2 Saturation: 92 %
Patient temperature: 99.5
Potassium: 4.7 mmol/L (ref 3.5–5.1)
Sodium: 145 mmol/L (ref 135–145)
TCO2: 23 mmol/L (ref 22–32)
pCO2 arterial: 39.5 mmHg (ref 32.0–48.0)
pH, Arterial: 7.344 — ABNORMAL LOW (ref 7.350–7.450)
pO2, Arterial: 70 mmHg — ABNORMAL LOW (ref 83.0–108.0)

## 2019-06-26 LAB — BASIC METABOLIC PANEL
Anion gap: 9 (ref 5–15)
BUN: 64 mg/dL — ABNORMAL HIGH (ref 8–23)
CO2: 21 mmol/L — ABNORMAL LOW (ref 22–32)
Calcium: 8.1 mg/dL — ABNORMAL LOW (ref 8.9–10.3)
Chloride: 112 mmol/L — ABNORMAL HIGH (ref 98–111)
Creatinine, Ser: 1.76 mg/dL — ABNORMAL HIGH (ref 0.61–1.24)
GFR calc Af Amer: 42 mL/min — ABNORMAL LOW (ref >60)
GFR calc non Af Amer: 36 mL/min — ABNORMAL LOW (ref >60)
Glucose, Bld: 272 mg/dL — ABNORMAL HIGH (ref 70–99)
Potassium: 4.8 mmol/L (ref 3.5–5.1)
Sodium: 142 mmol/L (ref 135–145)

## 2019-06-26 LAB — URINE CULTURE: Culture: 5000 — AB

## 2019-06-26 LAB — GLUCOSE, CAPILLARY
Glucose-Capillary: 129 mg/dL — ABNORMAL HIGH (ref 70–99)
Glucose-Capillary: 177 mg/dL — ABNORMAL HIGH (ref 70–99)
Glucose-Capillary: 202 mg/dL — ABNORMAL HIGH (ref 70–99)
Glucose-Capillary: 224 mg/dL — ABNORMAL HIGH (ref 70–99)
Glucose-Capillary: 299 mg/dL — ABNORMAL HIGH (ref 70–99)
Glucose-Capillary: 301 mg/dL — ABNORMAL HIGH (ref 70–99)

## 2019-06-26 LAB — CREATININE, URINE, RANDOM: Creatinine, Urine: 34.71 mg/dL

## 2019-06-26 LAB — PROTIME-INR
INR: 3.4 — ABNORMAL HIGH (ref 0.8–1.2)
Prothrombin Time: 34.4 seconds — ABNORMAL HIGH (ref 11.4–15.2)

## 2019-06-26 LAB — SAVE SMEAR(SSMR), FOR PROVIDER SLIDE REVIEW

## 2019-06-26 MED ORDER — HYDRALAZINE HCL 20 MG/ML IJ SOLN
10.0000 mg | INTRAMUSCULAR | Status: DC | PRN
  Administered 2019-06-26 – 2019-06-27 (×2): 10 mg via INTRAVENOUS
  Filled 2019-06-26 (×2): qty 1

## 2019-06-26 MED ORDER — SODIUM BICARBONATE-DEXTROSE 150-5 MEQ/L-% IV SOLN
150.0000 meq | INTRAVENOUS | Status: AC
  Administered 2019-06-26: 150 meq via INTRAVENOUS
  Filled 2019-06-26 (×2): qty 1000

## 2019-06-26 MED ORDER — DOPAMINE-DEXTROSE 3.2-5 MG/ML-% IV SOLN
2.5000 ug/kg/min | INTRAVENOUS | Status: DC
  Administered 2019-06-26: 2.5 ug/kg/min via INTRAVENOUS
  Filled 2019-06-26: qty 250

## 2019-06-26 MED ORDER — SODIUM CHLORIDE 0.9 % IV SOLN
3.0000 g | Freq: Four times a day (QID) | INTRAVENOUS | Status: DC
  Administered 2019-06-26 – 2019-06-27 (×5): 3 g via INTRAVENOUS
  Filled 2019-06-26: qty 3
  Filled 2019-06-26 (×3): qty 8
  Filled 2019-06-26 (×3): qty 3

## 2019-06-26 MED ORDER — MIDAZOLAM HCL 2 MG/2ML IJ SOLN
1.0000 mg | INTRAMUSCULAR | Status: DC | PRN
  Administered 2019-06-26: 2 mg via INTRAVENOUS

## 2019-06-26 MED ORDER — SODIUM CHLORIDE 0.9% FLUSH
10.0000 mL | Freq: Two times a day (BID) | INTRAVENOUS | Status: DC
  Administered 2019-06-26 – 2019-07-05 (×19): 10 mL

## 2019-06-26 MED ORDER — SODIUM CHLORIDE 0.9% FLUSH: 10.0000 mL | INTRAVENOUS | Status: DC | PRN

## 2019-06-26 MED ORDER — FUROSEMIDE 10 MG/ML IJ SOLN
40.0000 mg | Freq: Once | INTRAMUSCULAR | Status: AC
  Administered 2019-06-26: 40 mg via INTRAVENOUS
  Filled 2019-06-26: qty 4

## 2019-06-26 MED ORDER — INSULIN ASPART 100 UNIT/ML ~~LOC~~ SOLN
0.0000 [IU] | SUBCUTANEOUS | Status: DC
  Administered 2019-06-26: 7 [IU] via SUBCUTANEOUS
  Administered 2019-06-26: 3 [IU] via SUBCUTANEOUS
  Administered 2019-06-26: 11 [IU] via SUBCUTANEOUS
  Administered 2019-06-26: 4 [IU] via SUBCUTANEOUS
  Administered 2019-06-27 (×2): 3 [IU] via SUBCUTANEOUS
  Administered 2019-06-27: 4 [IU] via SUBCUTANEOUS
  Administered 2019-06-28 – 2019-06-29 (×3): 3 [IU] via SUBCUTANEOUS
  Administered 2019-06-29: 4 [IU] via SUBCUTANEOUS
  Administered 2019-06-29 – 2019-06-30 (×2): 3 [IU] via SUBCUTANEOUS
  Administered 2019-06-30: 7 [IU] via SUBCUTANEOUS
  Administered 2019-06-30: 4 [IU] via SUBCUTANEOUS
  Administered 2019-07-01: 3 [IU] via SUBCUTANEOUS
  Administered 2019-07-01: 4 [IU] via SUBCUTANEOUS
  Administered 2019-07-01 – 2019-07-02 (×2): 3 [IU] via SUBCUTANEOUS
  Administered 2019-07-02 – 2019-07-03 (×3): 4 [IU] via SUBCUTANEOUS
  Administered 2019-07-03: 7 [IU] via SUBCUTANEOUS
  Administered 2019-07-03 – 2019-07-04 (×4): 4 [IU] via SUBCUTANEOUS
  Administered 2019-07-04: 7 [IU] via SUBCUTANEOUS

## 2019-06-26 MED ORDER — SODIUM BICARBONATE-DEXTROSE 150-5 MEQ/L-% IV SOLN: 150.0000 meq | INTRAVENOUS | Status: DC

## 2019-06-26 MED ORDER — FUROSEMIDE 10 MG/ML IJ SOLN: 40.0000 mg | Freq: Once | INTRAMUSCULAR | Status: DC

## 2019-06-26 MED ORDER — FENTANYL 2500MCG IN NS 250ML (10MCG/ML) PREMIX INFUSION
0.0000 ug/h | INTRAVENOUS | Status: DC
  Administered 2019-06-26: 200 ug/h via INTRAVENOUS
  Administered 2019-06-26: 25 ug/h via INTRAVENOUS
  Administered 2019-06-26: 200 ug/h via INTRAVENOUS
  Administered 2019-06-27: 100 ug/h via INTRAVENOUS
  Filled 2019-06-26 (×4): qty 250

## 2019-06-26 MED ORDER — PROPOFOL 1000 MG/100ML IV EMUL
0.0000 ug/kg/min | INTRAVENOUS | Status: DC
  Administered 2019-06-26 – 2019-06-27 (×3): 10 ug/kg/min via INTRAVENOUS
  Administered 2019-06-27: 30 ug/kg/min via INTRAVENOUS
  Administered 2019-06-27: 20 ug/kg/min via INTRAVENOUS
  Filled 2019-06-26 (×5): qty 100

## 2019-06-26 MED ORDER — MIDAZOLAM 50MG/50ML (1MG/ML) PREMIX INFUSION
0.5000 mg/h | INTRAVENOUS | Status: DC
  Administered 2019-06-26: 0.5 mg/h via INTRAVENOUS
  Filled 2019-06-26 (×2): qty 50

## 2019-06-26 NOTE — Progress Notes (Signed)
eLink Physician-Brief Progress Note Patient Name: Noah [name] DOB: 01/13/1941 MRN: [id number]   Date of Service  06/26/2019  HPI/Events of Note  Agitation - Precedex at ceiling. Request for bilateral soft wrist restraints.   eICU Interventions  Will order: 1. Increase ceiling on Fentanyl IV infusion to 400 mcg/hour.  2. Bilateral soft wrist restraints X 5 hours.      Intervention Category Major Interventions: Delirium, psychosis, severe agitation - evaluation and management  [name] 06/26/2019, 2:50 AM

## 2019-06-26 NOTE — Progress Notes (Signed)
eLink Physician-Brief Progress Note Patient Name: Noah [name] DOB: 1941/01/06 MRN: [id number]   Date of Service  06/26/2019  HPI/Events of Note  Agitation - Still not totally comfortable on Precedex and Fentanyl IV infusions at ceiling. HTN - BP = 192/83 Nursse requests PRN for HTN  eICU Interventions  Will order: 1. Increase ceiling on Precedex IV infusion to 2.0 mcg/kg/hour.  2. Hydralazine 10 mg IV Q 4 hours PRN SBP > 170 or DBP > 100.      Intervention Category Major Interventions: Delirium, psychosis, severe agitation - evaluation and management  Batya Citron Eugene 06/26/2019, 1:51 AM

## 2019-06-26 NOTE — Progress Notes (Signed)
eLink Physician-Brief Progress Note Patient Name: Noah [name] DOB: 06/05/1940 MRN: [id number]   Date of Service  06/26/2019  HPI/Events of Note  Pt with congestive heart failure and hypotension (MAP in the 50's)  eICU Interventions  Dopamine 2.5 mcg/kg/min infusion for inotropic support , hopefully blood pressure improves with increased cardiac output.        Thomasene Lot Ogan 06/26/2019, 8:30 PM

## 2019-06-26 NOTE — Progress Notes (Signed)
Inpatient Diabetes Program Recommendations  AACE/ADA: New Consensus Statement on Inpatient Glycemic Control (2015)  Target Ranges:  Prepandial:   less than 140 mg/dL      Peak postprandial:   less than 180 mg/dL (1-2 hours)      Critically ill patients:  140 - 180 mg/dL   Lab Results  Component Value Date   GLUCAP 299 (H) 06/26/2019   HGBA1C 8.3 (H) 06/25/2019    Review of Glycemic Control Results for SHANON, BECVAR (MRN [id number]) as of 06/26/2019 10:45  Ref. Range 06/25/2019 19:58 06/26/2019 00:25 06/26/2019 04:17 06/26/2019 08:12  Glucose-Capillary Latest Ref Range: 70 - 99 mg/dL 968 (H) 864 (H) 847 (H) 299 (H)   Diabetes history: Type 2 DM Outpatient Diabetes medications: Soliqua 100-33 60 units QAM, Glipizide 2.5 mg QAM Current orders for Inpatient glycemic control: Novolog 0-20 units Q4H Vital @ 60 ml/hr Solucortef 50 mg Q6H  Inpatient Diabetes Program Recommendations:    Noted patient experienced hypoglycemia yesterday on 3/8 of 53 mg/dL and subsequent order changes. Patient was on Lantus 60 units.   Given start of tube feeds, consider:  -Novolog 3 units Q4H -If trends continue to exceed inpatient goals, could consider restarting basal with Levemir 8 units BID.   Thanks, Lujean Rave, MSN, RNC-OB Diabetes Coordinator [phone] (8a-5p)

## 2019-06-26 NOTE — Progress Notes (Addendum)
[place] for Coumadin Indication: atrial fibrillation  Allergies  Allergen Reactions  . Codeine Other (See Comments)    constipation  . Erythromycin-Sulfisoxazole Other (See Comments)    Causes infection in throat and eyes    Patient Measurements: Height: 5\' 8"  (172.7 cm) Weight: 292 lb 5.3 oz (132.6 kg) IBW/kg (Calculated) : 68.4  Vital Signs: Temp: 99.7 F (37.6 C) (03/09 0837) Temp Source: Core (03/09 0400) BP: 74/65 (03/09 0800) Pulse Rate: 122 (03/09 0837)  Labs: Recent Labs    06/23/19 1552 06/23/19 1727 06/23/19 1727 06/23/19 1903 06/23/19 2125 06/24/19 0304 06/24/19 1150 06/25/19 0215 06/25/19 0308 06/25/19 0434 06/25/19 1125 06/25/19 1125 06/25/19 1753 06/26/19 0221 06/26/19 0640  HGB   < >  --   --   --   --  11.2*  --    < > 10.0*   < > 9.9*   < >  --  10.1* 10.9*  HCT   < >  --   --   --   --  38.3*  --    < > 33.8*   < > 29.0*  --   --  33.2* 32.0*  PLT   < >  --   --   --   --  PLATELET CLUMPS NOTED ON SMEAR, COUNT APPEARS DECREASED  --   --  97*  --   --   --   --  87*  --   APTT  --  48*  --   --   --   --   --   --   --   --   --   --   --   --   --   LABPROT  --  15.4*   < >  --   --   --  19.9*  --  25.8*  --   --   --   --  34.4*  --   INR  --  1.2   < >  --   --   --  1.7*  --  2.4*  --   --   --   --  3.4*  --   CREATININE   < >  --    < >  --   --  1.72*  --   --  2.03*  --   --   --  1.88* 1.76*  --   TROPONINIHS  --   --   --  6 6  --   --   --   --   --   --   --   --   --   --    < > = values in this interval not displayed.    Estimated Creatinine Clearance: 46 mL/min (A) (by C-G formula based on SCr of 1.76 mg/dL (H)).   Medical History: Past Medical History:  Diagnosis Date  . CAD S/P percutaneous coronary angioplasty    remote PCIs in 1990's- last CFX PCI 2008  . Chronic kidney disease, stage III (moderate)   . Diabetes mellitus with complication (HCC)    with hyperglycemia and diabetic  autonomic poly neuropathy  . Gastro-esophageal reflux disease with esophagitis   . Hyperlipidemia   . Hypertension   . Ischemic cardiomyopathy 01/20/2018   St Jude ICD   . Morbid obesity ([place])   . Obstructive sleep apnea    on CPAP  . Polyneuropathy   . Primary insomnia  Assessment: 79yo male presented to [facility] ED c/o SOB, concern for pulmonary edema, tx'd to [facility] for further evaluation, to continue Coumadin for Afib.  INR this morning has trended up to above goal at 3.4, hgb and LFTs stable. Thrombocytopenia stable with plt count 87 this am.  No bleeding issues noted.   *PTA dose = 5mg  TTS, 2.5mg  AODs  Antibiotics are being changed to unasyn. Patient currently afebrile, wbc 10.8. Will watch dosing carefully d/t scr of 1.76.  3/6 bld - ngtd 3/6 urine - E.faecalis - S to amp, vanc  Goal of Therapy:  INR 2-3   Plan:  Hold warfarin tonight Unasyn 3g q6 hours  PharmD., BCPS Clinical Pharmacist 06/26/2019 9:14 AM

## 2019-06-26 NOTE — Progress Notes (Signed)
eLink Physician-Brief Progress Note Patient Name: Noah [name] DOB: 1940/10/30 MRN: [id number]   Date of Service  06/26/2019  HPI/Events of Note  Agitation - Currently on a Precedex IV infusion and Fentanyl IV PRN.  eICU Interventions  Will order: 1. Fentanyl IV infusion. Titrate to RASS = 0 to -1.      Intervention Category Major Interventions: Delirium, psychosis, severe agitation - evaluation and management  Christofer Shen Eugene 06/26/2019, 12:33 AM

## 2019-06-26 NOTE — Progress Notes (Signed)
Peripherally Inserted Central Catheter/Midline Placement  The IV Nurse has discussed with the patient and/or persons authorized to consent for the patient, the purpose of this procedure and the potential benefits and risks involved with this procedure.  The benefits include less needle sticks, lab draws from the catheter, and the patient may be discharged home with the catheter. Risks include, but not limited to, infection, bleeding, blood clot (thrombus formation), and puncture of an artery; nerve damage and irregular heartbeat and possibility to perform a PICC exchange if needed/ordered by physician.  Alternatives to this procedure were also discussed.  Bard Power PICC patient education guide, fact sheet on infection prevention and patient information card has been provided to patient /or left at bedside.    PICC/Midline Placement Documentation  PICC Double Lumen 06/26/19 PICC Right Basilic 47 cm 0 cm (Active)  Indication for Insertion or Continuance of Line Vasoactive infusions 06/26/19 1505  Exposed Catheter (cm) 0 cm 06/26/19 1505  Site Assessment Clean;Dry;Intact 06/26/19 1505  Lumen #1 Status Flushed;Blood return noted 06/26/19 1505  Lumen #2 Status Flushed;Blood return noted 06/26/19 1505  Dressing Type Transparent 06/26/19 1505  Dressing Status Clean;Dry;Intact;Antimicrobial disc in place;Other (Comment) 06/26/19 1505  Dressing Intervention New dressing 06/26/19 1505  Dressing Change Due 07/03/19 06/26/19 1505   Telephone consent signed by daughter    Maximino Greenland 06/26/2019, 3:06 PM

## 2019-06-26 NOTE — Progress Notes (Addendum)
eLink Physician-Brief Progress Note Patient Name: Noah [name] DOB: 04-12-1941 MRN: [id number]   Date of Service  06/26/2019  HPI/Events of Note  Agitation - Presently on Fentanyl and Precedex IV infusions at ceiling doses.   eICU Interventions  Will order: 1. Versed IV infusion. Titrate to RASS = 0 to -1.  2 Versed 1-2 mg IV Q 1 hour PRN agitation or sedation.      Intervention Category Major Interventions: Delirium, psychosis, severe agitation - evaluation and management  Lucyle Alumbaugh Eugene 06/26/2019, 4:02 AM

## 2019-06-26 NOTE — Progress Notes (Addendum)
NAME:  Noah [name], MRN:  [id number], DOB:  [birth year], LOS: 2 ADMISSION DATE:  06/23/2019, CONSULTATION DATE: 06/24/2019 REFERRING MD:  Noah Sorrow, MD CHIEF COMPLAINT:  SOB    Brief History   79 y/o M who presented to [facility] on 3/6 with reports of SOB.   The patient was admitted Jan 2021 for encephalopathy thought secondary to volume depletion in the setting of antihypertensives and discharge to a SNF.  Apparently was noted to have AF 3/5 and contacted by an Therapist, sports for phone encounter.  During that call he reported trouble walking and was disoriented compared to baseline.  EMS activated with ER evaluation 3/6 found him to be hypotension and hypothermia. He was treated as sepsis with IVF, empiric abx and levophed. Transferred to [facility] 3/6. Lactic acid remained <2, PCT negative. Patient developed worsening lethargy, hypercarbia am 3/8 requiring intubation.   Past Medical History  CAD s/p PCI in 2008 with DES  Ischemic cardiomyopathy, combined systolic and diastolic dysfunction, s/p BiV ICD A-fib on Coumadin HTN HLD DM GERD OSA on 3L O2  CKD Stage 3  SAH - 2020, on [facility]   3/06 Admit  3/08 Intubated for hypercarbic respiratory failure 3/09 Intermittent agitation overnight  Consults:     Procedures:  ETT 3/8 >>  R Radial ALine 3/7 >>  Significant Diagnostic Tests:   ECHO 3/8 >> LVEF 35-40%, technically difficult study, inferoseptal, inferolateral and apical hypokinesis, overall moderate LV dysfunction, mild MR, RWMA noted in LV, RV systolic function normal, moderately elevated PA systolic pressure  CT Head 3/8 >> mild chronic ischemic white matter disease, no acute abnormality   Micro Data:  RVP 3/6 >> negative  COVID 3/6 >> negative  Influenza A/B 3/6 >> negative  BCx2 3/6 >>  UC 3/6 >> enterococcus faecalis >> pan sensitive   Antimicrobials:  Vanco 3/6 >> 3/8 Cefepime 3/6 >> 3/9 Unasyn 3/9 >>   Subjective   RN reports intermittent  agitation overnight > pt's fentanyl / precedex stopped and pt started on versed gtt.  Remains agitated this am.   Remains on D10  Glucose range 224-301  I/O 1.9L UOP, +963ml in 24h Tmax 99.7 / WBC 10.8 Remains on 40%, PEEP 5  Off vasopressors   Objective   Blood pressure (!) 74/65, pulse (!) 122, temperature 99.7 F (37.6 C), resp. rate (!) 26, height 5\' 8"  (1.727 m), weight 132.6 kg, SpO2 96 %.    Vent Mode: PRVC FiO2 (%):  [40 %] 40 % Set Rate:  [18 bmp-22 bmp] 22 bmp Vt Set:  [540 mL] 540 mL PEEP:  [5 cmH20] 5 cmH20 Pressure Support:  [5 cmH20] 5 cmH20 Plateau Pressure:  [17 cmH20-22 cmH20] 17 cmH20   Intake/Output Summary (Last 24 hours) at 06/26/2019 0847 Last data filed at 06/26/2019 0813 Gross per 24 hour  Intake 2872.73 ml  Output 2030 ml  Net 842.73 ml   Filed Weights   06/24/19 0500 06/25/19 0500 06/26/19 0213  Weight: 129.2 kg 130.5 kg 132.6 kg    Examination: General: chronically ill appearing elderly male lying in bed on vent in NAD  HEENT: MM pink/moist, ETT, irregular shaped R pupil, left pinpoint  Neuro: awakens, eyes open, wiggles toes to command, intermittent agitation CV: s1s2 ST, ? V intermittent pacing, no m/r/g PULM: non-labored on vent, lungs bilaterally coarse GI: soft, bsx4 active  Extremities: warm/dry, trace to 1+ generalized edema  Skin: no rashes, ecchymosis noted on R upper arm  Assessment & Plan:   Hypotension - resolved  Adrenal Insufficiency  Enterococcus UTI  Initial low index of suspicion for septic shock with negative PCT, flat lactic acid.  UA negative but UC growing enterococcus which may be a colonization.  Cortisol low which raises concern for adrenal insufficiency. BP improved after stress dose steroids. Question component of cardiogenic shock as well with new slight reduction of LVEF to 35-40% (was 40-45%).  -continue stress dose steroids  -continue aline for now  -narrow abx as above   Cardiogenic Pulmonary Edema Stable  Left Pleural Effusion  CXR with stable left pleural effusion, mild edema  -follow intermittent CXR  -Lasix 40 mg IV x1   Acute Metabolic Encephalopathy Suspect multifactorial in the setting of metabolic acidosis, hypercarbia, & hypoglycemia.  CT Head negative -change to propofol + fentanyl  -RASS Goal 0 to -1  -stop benzodiazepine infusion (in isolation)  CAD s/p PCI in 2008 with DES  Ischemic cardiomyopathy, combined systolic and diastolic dysfunction, s/p BiV ICD V-Pacing am 3/9  A-fib on Coumadin HTN HLD -tele monitoring  -coumadin per pharmacy  -follow INR  -EKG now with Vpacing   Coagulopathy  Thrombocytopenia  AKI, AMS, renal failure, thrombocytopenia on admit, r/o TTP, HUS -assess LFT's -coumadin per pharmacy  -assess peripheral smear   OSA  3L O2 at baseline -will need CPAP post extubation   AKI on CKD III Non gap Metabolic Acidosis  Baseline Cr 1.4 CKD with 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 bicarbonate loss -assess renal US, urine sodium, urine creatinine -stop bicarbonate after current bag -Trend BMP / urinary output -Replace electrolytes as indicated -Avoid nephrotoxic agents, ensure adequate renal perfusion  DM  Hypoglycemia  -stop D10  -continue TF  -SSI, resistant scale   At Risk Malnutrition  -continue TF per nutrition   Best practice:  Diet: NPO, TF  Pain/Anxiety/Delirium protocol (if indicated): precedex  VAP protocol (if indicated): in place  DVT prophylaxis: Coumadin for Afib  GI prophylaxis: PPI  Glucose control: as above Mobility: Bedrest Code Status: Full Code Family Communication: Wife Noah [name]  [phone].  Will update on arrival 3/9.  Disposition: ICU  Labs   CBC: Recent Labs  Lab 06/23/19 1552 06/23/19 1552 06/24/19 0304 06/25/19 0215 06/25/19 0308 06/25/19 0308 06/25/19 0434 06/25/19 0753 06/25/19 1125 06/26/19 0221 06/26/19 0640  WBC 9.7  --  12.1*  --  15.4*  --   --   --   --  10.8*  --   NEUTROABS 8.2*  --   --   --   --    --   --   --   --   --   --   HGB 11.2*   < > 11.2*   < > 10.0*   < > 9.9* 9.5* 9.9* 10.1* 10.9*  HCT 37.5*   < > 38.3*   < > 33.8*   < > 29.0* 28.0* 29.0* 33.2* 32.0*  MCV 93.1  --  94.6  --  94.2  --   --   --   --  90.7  --   PLT 86*  --  PLATELET CLUMPS NOTED ON SMEAR, COUNT APPEARS DECREASED  --  97*  --   --   --   --  87*  --    < > = values in this interval not displayed.    Basic Metabolic Panel: Recent Labs  Lab 06/23/19 1552 06/23/19 1552 06/24/19 0304 06/24/19 9937 06/25/19 0215 06/25/19 0308 06/25/19 1696 06/25/19 0753 06/25/19  1125 06/25/19 1753 06/26/19 0221 06/26/19 0640  NA 141   < > 145  --    < > 144   < > 148* 147* 142 142 145  K 5.1   < > 5.5*  --    < > 5.1   < > 4.9 4.5 4.7 4.8 4.7  CL 111  --  117*  --   --  116*  --   --   --  114* 112*  --   CO2 24  --  19*  --   --  18*  --   --   --  20* 21*  --   GLUCOSE 159*  --  64*  --   --  44*  --   --   --  127* 272*  --   BUN 66*  --  62*  --   --  63*  --   --   --  62* 64*  --   CREATININE 1.65*  --  1.72*  --   --  2.03*  --   --   --  1.88* 1.76*  --   CALCIUM 8.6*  --  8.3*  --   --  8.0*  --   --   --  7.8* 8.1*  --   MG  --   --   --  1.9  --   --   --   --   --   --   --   --   PHOS  --   --   --  6.0*  --   --   --   --   --   --   --   --    < > = values in this interval not displayed.   GFR: Estimated Creatinine Clearance: 46 mL/min (A) (by C-G formula based on SCr of 1.76 mg/dL (H)). Recent Labs  Lab 06/23/19 1552 06/23/19 1727 06/23/19 2125 06/24/19 0304 06/24/19 1937 06/25/19 0308 06/25/19 0314 06/26/19 0208 06/26/19 0221  PROCALCITON  --   --   --  <0.10  --   --   --   --   --   WBC 9.7  --   --  12.1*  --  15.4*  --   --  10.8*  LATICACIDVEN  --    < > 0.7  --  0.5  --  0.4* 0.6  --    < > = values in this interval not displayed.    Liver Function Tests: Recent Labs  Lab 06/23/19 1727 06/24/19 0304 06/25/19 0308  AST 39 35 21  ALT 54* 57* 44  ALKPHOS 101 115 118    BILITOT 0.6 0.7 0.5  PROT 6.0* 6.2* 5.8*  ALBUMIN 3.0* 3.0* 2.7*   No results for input(s): LIPASE, AMYLASE in the last 168 hours. No results for input(s): AMMONIA in the last 168 hours.  ABG    Component Value Date/Time   PHART 7.344 (L) 06/26/2019 0640   PCO2ART 39.5 06/26/2019 0640   PO2ART 70.0 (L) 06/26/2019 0640   HCO3 21.4 06/26/2019 0640   TCO2 23 06/26/2019 0640   ACIDBASEDEF 4.0 (H) 06/26/2019 0640   O2SAT 92.0 06/26/2019 0640     Coagulation Profile: Recent Labs  Lab 06/23/19 1727 06/24/19 1150 06/25/19 0308 06/26/19 0221  INR 1.2 1.7* 2.4* 3.4*    Cardiac Enzymes: No results for input(s): CKTOTAL, CKMB, CKMBINDEX, TROPONINI in the last 168 hours.  HbA1C: Hgb A1c  MFr Bld  Date/Time Value Ref Range Status  06/25/2019 03:15 PM 8.3 (H) 4.8 - 5.6 % Final    Comment:    (NOTE) Pre diabetes:          5.7%-6.4% Diabetes:              >6.4% Glycemic control for   <7.0% adults with diabetes   03/28/2019 08:19 PM 8.7 (H) 4.8 - 5.6 % Final    Comment:    (NOTE) Pre diabetes:          5.7%-6.4% Diabetes:              >6.4% Glycemic control for   <7.0% adults with diabetes     CBG: Recent Labs  Lab 06/25/19 1533 06/25/19 1958 06/26/19 0025 06/26/19 0417 06/26/19 0812  GLUCAP 104* 126* 224* 301* 299*      Critical care time: 35 minutes    Canary Brim, MSN, NP-C [facility] 06/26/2019, 9:14 AM   Please see [email or web address] for pager details.

## 2019-06-27 ENCOUNTER — Inpatient Hospital Stay (HOSPITAL_COMMUNITY): Payer: Medicare HMO

## 2019-06-27 DIAGNOSIS — N179 Acute kidney failure, unspecified: Secondary | ICD-10-CM

## 2019-06-27 DIAGNOSIS — N390 Urinary tract infection, site not specified: Secondary | ICD-10-CM

## 2019-06-27 LAB — POCT I-STAT 7, (LYTES, BLD GAS, ICA,H+H)
Acid-Base Excess: 3 mmol/L — ABNORMAL HIGH (ref 0.0–2.0)
Bicarbonate: 29.6 mmol/L — ABNORMAL HIGH (ref 20.0–28.0)
Calcium, Ion: 1.32 mmol/L (ref 1.15–1.40)
HCT: 28 % — ABNORMAL LOW (ref 39.0–52.0)
Hemoglobin: 9.5 g/dL — ABNORMAL LOW (ref 13.0–17.0)
O2 Saturation: 98 %
Patient temperature: 36.6
Potassium: 4.3 mmol/L (ref 3.5–5.1)
Sodium: 150 mmol/L — ABNORMAL HIGH (ref 135–145)
TCO2: 31 mmol/L (ref 22–32)
pCO2 arterial: 52 mmHg — ABNORMAL HIGH (ref 32.0–48.0)
pH, Arterial: 7.362 (ref 7.350–7.450)
pO2, Arterial: 119 mmHg — ABNORMAL HIGH (ref 83.0–108.0)

## 2019-06-27 LAB — COMPREHENSIVE METABOLIC PANEL
ALT: 25 U/L (ref 0–44)
AST: 13 U/L — ABNORMAL LOW (ref 15–41)
Albumin: 2.2 g/dL — ABNORMAL LOW (ref 3.5–5.0)
Alkaline Phosphatase: 101 U/L (ref 38–126)
Anion gap: 9 (ref 5–15)
BUN: 79 mg/dL — ABNORMAL HIGH (ref 8–23)
CO2: 25 mmol/L (ref 22–32)
Calcium: 8.3 mg/dL — ABNORMAL LOW (ref 8.9–10.3)
Chloride: 114 mmol/L — ABNORMAL HIGH (ref 98–111)
Creatinine, Ser: 1.62 mg/dL — ABNORMAL HIGH (ref 0.61–1.24)
GFR calc Af Amer: 46 mL/min — ABNORMAL LOW (ref >60)
GFR calc non Af Amer: 40 mL/min — ABNORMAL LOW (ref >60)
Glucose, Bld: 125 mg/dL — ABNORMAL HIGH (ref 70–99)
Potassium: 3.9 mmol/L (ref 3.5–5.1)
Sodium: 148 mmol/L — ABNORMAL HIGH (ref 135–145)
Total Bilirubin: 0.6 mg/dL (ref 0.3–1.2)
Total Protein: 5.4 g/dL — ABNORMAL LOW (ref 6.5–8.1)

## 2019-06-27 LAB — GLUCOSE, CAPILLARY
Glucose-Capillary: 106 mg/dL — ABNORMAL HIGH (ref 70–99)
Glucose-Capillary: 109 mg/dL — ABNORMAL HIGH (ref 70–99)
Glucose-Capillary: 112 mg/dL — ABNORMAL HIGH (ref 70–99)
Glucose-Capillary: 116 mg/dL — ABNORMAL HIGH (ref 70–99)
Glucose-Capillary: 132 mg/dL — ABNORMAL HIGH (ref 70–99)
Glucose-Capillary: 150 mg/dL — ABNORMAL HIGH (ref 70–99)
Glucose-Capillary: 165 mg/dL — ABNORMAL HIGH (ref 70–99)

## 2019-06-27 LAB — TRIGLYCERIDES: Triglycerides: 156 mg/dL — ABNORMAL HIGH (ref <150)

## 2019-06-27 LAB — CBC
HCT: 30.8 % — ABNORMAL LOW (ref 39.0–52.0)
Hemoglobin: 9.5 g/dL — ABNORMAL LOW (ref 13.0–17.0)
MCH: 27.9 pg (ref 26.0–34.0)
MCHC: 30.8 g/dL (ref 30.0–36.0)
MCV: 90.6 fL (ref 80.0–100.0)
Platelets: 121 10*3/uL — ABNORMAL LOW (ref 150–400)
RBC: 3.4 MIL/uL — ABNORMAL LOW (ref 4.22–5.81)
RDW: 14.3 % (ref 11.5–15.5)
WBC: 10.6 10*3/uL — ABNORMAL HIGH (ref 4.0–10.5)
nRBC: 0 % (ref 0.0–0.2)

## 2019-06-27 LAB — PROTIME-INR
INR: 3.3 — ABNORMAL HIGH (ref 0.8–1.2)
Prothrombin Time: 33.5 seconds — ABNORMAL HIGH (ref 11.4–15.2)

## 2019-06-27 MED ORDER — ASPIRIN 81 MG PO CHEW
CHEWABLE_TABLET | ORAL | Status: AC
  Administered 2019-06-27: 81 mg
  Filled 2019-06-27: qty 1

## 2019-06-27 MED ORDER — NOREPINEPHRINE 4 MG/250ML-% IV SOLN
INTRAVENOUS | Status: AC
  Filled 2019-06-27: qty 250

## 2019-06-27 MED ORDER — SODIUM CHLORIDE 0.9 % IV SOLN
750.0000 mg | Freq: Two times a day (BID) | INTRAVENOUS | Status: DC
  Administered 2019-06-27 – 2019-06-29 (×4): 750 mg via INTRAVENOUS
  Filled 2019-06-27 (×5): qty 7.5

## 2019-06-27 MED ORDER — FUROSEMIDE 10 MG/ML IJ SOLN
40.0000 mg | Freq: Once | INTRAMUSCULAR | Status: AC
  Administered 2019-06-27: 40 mg via INTRAVENOUS
  Filled 2019-06-27: qty 4

## 2019-06-27 MED ORDER — METOPROLOL TARTRATE 5 MG/5ML IV SOLN
5.0000 mg | Freq: Four times a day (QID) | INTRAVENOUS | Status: AC
  Administered 2019-06-27 – 2019-06-28 (×5): 5 mg via INTRAVENOUS
  Filled 2019-06-27 (×6): qty 5

## 2019-06-27 MED ORDER — DEXMEDETOMIDINE HCL IN NACL 400 MCG/100ML IV SOLN
0.1000 ug/kg/h | INTRAVENOUS | Status: DC
  Administered 2019-06-27: 0.4 ug/kg/h via INTRAVENOUS
  Administered 2019-06-28: 0.6 ug/kg/h via INTRAVENOUS
  Filled 2019-06-27: qty 200

## 2019-06-27 MED ORDER — ORAL CARE MOUTH RINSE
15.0000 mL | Freq: Two times a day (BID) | OROMUCOSAL | Status: DC
  Administered 2019-06-27 – 2019-07-05 (×17): 15 mL via OROMUCOSAL

## 2019-06-27 MED ORDER — LEVETIRACETAM 100 MG/ML PO SOLN
750.0000 mg | Freq: Two times a day (BID) | ORAL | Status: DC
  Filled 2019-06-27: qty 7.5

## 2019-06-27 MED ORDER — NOREPINEPHRINE 16 MG/250ML-% IV SOLN
0.0000 ug/min | INTRAVENOUS | Status: DC
  Administered 2019-06-27: 2 ug/min via INTRAVENOUS
  Filled 2019-06-27: qty 250

## 2019-06-27 MED ORDER — ASPIRIN 81 MG PO CHEW
81.0000 mg | CHEWABLE_TABLET | Freq: Every day | ORAL | Status: DC
  Administered 2019-06-27: 81 mg

## 2019-06-27 MED ORDER — METOPROLOL TARTRATE 5 MG/5ML IV SOLN: 2.5000 mg | INTRAVENOUS | Status: DC | PRN

## 2019-06-27 MED ORDER — METOPROLOL TARTRATE 12.5 MG HALF TABLET: 12.5000 mg | ORAL_TABLET | Freq: Two times a day (BID) | ORAL | Status: DC

## 2019-06-27 NOTE — Plan of Care (Signed)
Plan of care note  Called to bedside that patient was having issues with agitation and keeping mask on. He will go from being completely sedated to agitated and he is interfering with medical care. Given the above will put in for precedex to help him sleep overnight and hopefull help with agitated delirium.    Vertis Kelch DO Internal Medicine/Pediatrics Pulmonary and Critical Care Fellow PGY-6

## 2019-06-27 NOTE — Evaluation (Signed)
Physical Therapy Evaluation Patient Details Name: Noah [name] MRN: [id number] DOB: 02-12-1941 Today's Date: 06/27/2019   History of Present Illness  79 y/o M who presented to [facility] on 3/6 with reports of SOB, found to by hypotensive and hypothermic, treated for sepsis. Pt required intubation on 3/8 due to letahrgy and hypercarbia. Extubated on 3/10.  Clinical Impression  Pt presents to PT with deficits in functional mobility, gait, balance, endurance, power, strength, cognition, safety awareness. Pt currently requires physical assistance to perform all bed mobility, and is confused at times during session, attempting to pull off BiPAP despite PT instruction. Pt is limited by reports of dizziness once sitting at edge of bed reporting feeling better once returned to supine and repositioned. Pt will benefit from acute PT POC to improve mobility and reduce caregiver burden. Pt will benefit from inpatient PT services at the time of discharge due to functional mobility and safety awareness deficits.    Follow Up Recommendations CIR;Supervision/Assistance - 24 hour    Equipment Recommendations  (defer to post-acute setting)    Recommendations for Other Services       Precautions / Restrictions Precautions Precautions: Fall Restrictions Weight Bearing Restrictions: No      Mobility  Bed Mobility Overal bed mobility: Needs Assistance Bed Mobility: Supine to Sit;Sit to Supine;Rolling Rolling: Min assist   Supine to sit: Mod assist Sit to supine: Mod assist      Transfers Overall transfer level: (deferred 2/2 dizziness)                  Ambulation/Gait                Stairs            Wheelchair Mobility    Modified Rankin (Stroke Patients Only)       Balance Overall balance assessment: Needs assistance Sitting-balance support: Single extremity supported;Feet supported Sitting balance-Leahy Scale: Fair Sitting balance - Comments: minG-minA at edge of  bed                                     Pertinent Vitals/Pain Pain Assessment: No/denies pain    Home Living Family/patient expects to be discharged to:: Private residence Living Arrangements: Spouse/significant other Available Help at Discharge: Family;Available 24 hours/day Type of Home: House Home Access: Stairs to enter Entrance Stairs-Rails: None Entrance Stairs-Number of Steps: 2 Home Layout: Two level;Able to live on main level with bedroom/bathroom(bed/bath may be upstairs according to previous admission) Home Equipment: Cane - single point;Walker - 4 wheels Additional Comments: some history obtained from previous admission due to patient confusion    Prior Function Level of Independence: Independent with assistive device(s)         Comments: pt reports being a household ambulator with cane     Hand Dominance   Dominant Hand: Right    Extremity/Trunk Assessment   Upper Extremity Assessment Upper Extremity Assessment: Overall WFL for tasks assessed    Lower Extremity Assessment Lower Extremity Assessment: Generalized weakness    Cervical / Trunk Assessment Cervical / Trunk Assessment: Other exceptions(obesity)  Communication   Communication: HOH  Cognition Arousal/Alertness: Awake/alert Behavior During Therapy: Flat affect Overall Cognitive Status: Impaired/Different from baseline Area of Impairment: Orientation;Memory;Following commands;Safety/judgement                 Orientation Level: Disoriented to;Time;Person(requires cues for birth year)   Memory: Decreased recall of precautions;Decreased  short-term memory Following Commands: Follows one step commands consistently;Follows one step commands with increased time Safety/Judgement: Decreased awareness of safety;Decreased awareness of deficits            General Comments General comments (skin integrity, edema, etc.): pt on BiPAP throughout session, VSS    Exercises      Assessment/Plan    PT Assessment Patient needs continued PT services  PT Problem List Decreased strength;Decreased activity tolerance;Decreased balance;Decreased mobility;Decreased cognition;Decreased safety awareness;Decreased knowledge of precautions;Cardiopulmonary status limiting activity       PT Treatment Interventions DME instruction;Gait training;Stair training;Functional mobility training;Therapeutic activities;Therapeutic exercise;Balance training;Neuromuscular re-education;Cognitive remediation;Patient/family education    PT Goals (Current goals can be found in the Care Plan section)  Acute Rehab PT Goals Patient Stated Goal: To improve mobility PT Goal Formulation: With patient/family Time For Goal Achievement: 07/11/19 Potential to Achieve Goals: Good    Frequency Min 3X/week   Barriers to discharge        Co-evaluation               AM-PAC PT "6 Clicks" Mobility  Outcome Measure Help needed turning from your back to your side while in a flat bed without using bedrails?: A Lot Help needed moving from lying on your back to sitting on the side of a flat bed without using bedrails?: A Lot Help needed moving to and from a bed to a chair (including a wheelchair)?: Total Help needed standing up from a chair using your arms (e.g., wheelchair or bedside chair)?: Total Help needed to walk in hospital room?: Total Help needed climbing 3-5 steps with a railing? : Total 6 Click Score: 8    End of Session Equipment Utilized During Treatment: Oxygen Activity Tolerance: Treatment limited secondary to medical complications (Comment)(dizziness) Patient left: in bed;with call bell/phone within reach;with family/visitor present;with bed alarm set Nurse Communication: Mobility status PT Visit Diagnosis: Muscle weakness (generalized) (M62.81)    Time: 8366-2947 PT Time Calculation (min) (ACUTE ONLY): 27 min   Charges:   PT Evaluation $PT Eval Moderate Complexity: 1  Mod PT Treatments $Therapeutic Activity: 8-22 mins        Zenaida Niece, PT, DPT Acute Rehabilitation Pager: [phone]   Zenaida Niece 06/27/2019, 6:07 PM

## 2019-06-27 NOTE — Procedures (Signed)
Extubation Procedure Note  Patient Details:   Name: Noah [name] DOB: 04/17/1941 MRN: [id number]   Airway Documentation:    Vent end date: 06/27/19 Vent end time: 1257   Evaluation  O2 sats: stable throughout Complications: No apparent complications Patient did tolerate procedure well. Bilateral Breath Sounds: Clear, Diminished   Yes   Patient extubated to 4L nasal cannula per MD order.  Positive cuff leak noted.  No evidence of stridor.  Patient able to speak post extubation.  Sats currently 91%. Vitals are stable.  No complications noted.   Elyn Peers 06/27/2019, 1:01 PM

## 2019-06-27 NOTE — Progress Notes (Signed)
ANTICOAGULATION CONSULT NOTE  Pharmacy Consult for Coumadin Indication: atrial fibrillation  Allergies  Allergen Reactions  . Codeine Other (See Comments)    constipation  . Erythromycin-Sulfisoxazole Other (See Comments)    Causes infection in throat and eyes    Patient Measurements: Height: 5\' 8"  (172.7 cm) Weight: 292 lb 15.9 oz (132.9 kg) IBW/kg (Calculated) : 68.4  Vital Signs: Temp: 98.2 F (36.8 C) (03/10 0930) Temp Source: Bladder (03/10 0800) BP: 187/56 (03/10 1158) Pulse Rate: 98 (03/10 1158)  Labs: Recent Labs    06/25/19 0308 06/25/19 0434 06/25/19 1125 06/25/19 1753 06/26/19 0221 06/26/19 0221 06/26/19 0640 06/27/19 0423  HGB 10.0*   < >   < >  --  10.2*  10.1*   < > 10.9* 9.5*  HCT 33.8*   < >   < >  --  33.4*  33.2*  --  32.0* 30.8*  PLT 97*  --   --   --  87*  87*  --   --  121*  LABPROT 25.8*  --   --   --  34.4*  --   --  33.5*  INR 2.4*  --   --   --  3.4*  --   --  3.3*  CREATININE 2.03*   < >  --  1.88* 1.76*  --   --  1.62*   < > = values in this interval not displayed.    Estimated Creatinine Clearance: 50.1 mL/min (A) (by C-G formula based on SCr of 1.62 mg/dL (H)).   Medical History: Past Medical History:  Diagnosis Date  . CAD S/P percutaneous coronary angioplasty    remote PCIs in 1990's- last CFX PCI 2008  . Chronic kidney disease, stage III (moderate)   . Diabetes mellitus with complication (HCC)    with hyperglycemia and diabetic autonomic poly neuropathy  . Gastro-esophageal reflux disease with esophagitis   . Hyperlipidemia   . Hypertension   . Ischemic cardiomyopathy 01/20/2018   St Jude ICD   . Morbid obesity (HCC)   . Obstructive sleep apnea    on CPAP  . Polyneuropathy   . Primary insomnia     Assessment: 79yo male presented to [facility] ED c/o SOB, concern for pulmonary edema, tx'd to [facility] for further evaluation, to continue Coumadin for Afib. Admission INR low at 1.2.  INR today remains elevated at 3.3. Renal  function is improving as today's creatinine is 1.62. H&H is stable at 9.5/30.8, plts are low but increased since yesterday at 121. He is on tube feeds that contain vitamin K.   *PTA dose = 2.5 daily per recent anti-coag visit on 2/25.  Goal of Therapy:  INR 2-3   Plan:  Will hold warfarin tonight again  Daily INR and CBC   Thank you,   3/25, PharmD PGY-1 Pharmacy Resident   Please check amion for clinical pharmacist contact number

## 2019-06-27 NOTE — Progress Notes (Signed)
Pt agitated on bipap. Screaming for help and pulling off bipap, which causes him to desat to mid-low 80's. Bipap alarming frequently. Pt is sometimes redirectable, but the moment we step out of the room, he is trying to climb out of bed/pull IV's, and is attempting to pull off bipap mask again. Will call elink to discuss possible options to keep pt calm & keep bipap on for his safety & oxygenation.    Repositioned pt several times, given water swabs, turned in bed, and intervened multiple times in order to help pt become more comfortable.

## 2019-06-27 NOTE — Progress Notes (Signed)
eLink Physician-Brief Progress Note Patient Name: Noah [name] DOB: 1941-01-27 MRN: [id number]   Date of Service  06/27/2019  HPI/Events of Note  Tachycardia and hypotension on low dose Dopamine.  eICU Interventions  Dopamine discontinued, Norepinephrine infusion at 0-15 mcg titrate for MAP of 65 mmHg        Shenelle Klas U Jaidy Cottam 06/27/2019, 4:38 AM

## 2019-06-27 NOTE — Progress Notes (Signed)
eLink Physician-Brief Progress Note Patient Name: Noah [name] DOB: Aug 05, 1940 MRN: [id number]   Date of Service  06/27/2019  HPI/Events of Note  Agitation - All sedatives stopped. Patient extubated to BiPAP. Reluctant to resedate and, given his history, not sure that I can do this safely on BiPAP.   eICU Interventions  Will order: 1. ABG STAT. 2. Will ask ground team to evaluate him at bedside.         Dayanne Yiu Dennard Nip 06/27/2019, 10:17 PM

## 2019-06-27 NOTE — Progress Notes (Addendum)
NAME:  Noah [name], MRN:  [id number], DOB:  05-19-1940, LOS: 3 ADMISSION DATE:  06/23/2019, CONSULTATION DATE: 06/24/2019 REFERRING MD:  Vanetta Mulders, MD CHIEF COMPLAINT:  SOB    Brief History   79 y/o M who presented to [facility] on 3/6 with reports of SOB.   The patient was admitted Jan 2021 for encephalopathy thought secondary to volume depletion in the setting of antihypertensives and discharge to a SNF.  Apparently was noted to have AF 3/5 and contacted by an Charity fundraiser for phone encounter.  During that call he reported trouble walking and was disoriented compared to baseline.  EMS activated with ER evaluation 3/6 found him to be hypotension and hypothermia. He was treated as sepsis with IVF, empiric abx and levophed. Transferred to [facility] 3/6. Lactic acid remained <2, PCT negative. Patient developed worsening lethargy, hypercarbia am 3/8 requiring intubation.   Past Medical History  CAD s/p PCI in 2008 with DES  Ischemic cardiomyopathy, combined systolic and diastolic dysfunction, s/p BiV ICD A-fib on Coumadin HTN HLD DM GERD OSA on 3L O2  CKD Stage 3  SAH - 2020, on keppra   Significant Hospital Events   3/06 Admit  3/08 Intubated for hypercarbic respiratory failure 3/09 Intermittent agitation overnight  Consults:     Procedures:  ETT 3/8 >>  R Radial ALine 3/7 >>  Significant Diagnostic Tests:   ECHO 3/8 >> LVEF 35-40%, technically difficult study, inferoseptal, inferolateral and apical hypokinesis, overall moderate LV dysfunction, mild MR, RWMA noted in LV, RV systolic function normal, moderately elevated PA systolic pressure  CT Head 3/8 >> mild chronic ischemic white matter disease, no acute abnormality   Micro Data:  RVP 3/6 >> negative  COVID 3/6 >> negative  Influenza A/B 3/6 >> negative  BCx2 3/6 >>  UC 3/6 >> enterococcus faecalis >> pan sensitive   Antimicrobials:  Vanco 3/6 >> 3/8 Cefepime 3/6 >> 3/9 Unasyn 3/9 >> 3/10  Subjective   RN reports pt weaning, on  mild sedation as gets very agitated when awake, tries to pull ETT.  Hypertensive when awake.    Glucose 106-125 I/O 1.9L UOP, +1.8L for 24 hours  Tmax 98.2 / WBC 10.6  Objective   Blood pressure (!) 175/68, pulse (!) 119, temperature 98.2 F (36.8 C), resp. rate (!) 24, height 5\' 8"  (1.727 m), weight 132.9 kg, SpO2 97 %.    Vent Mode: PSV;CPAP FiO2 (%):  [40 %] 40 % Set Rate:  [22 bmp] 22 bmp Vt Set:  [540 mL] 540 mL PEEP:  [5 cmH20] 5 cmH20 Pressure Support:  [8 cmH20] 8 cmH20 Plateau Pressure:  [19 cmH20-22 cmH20] 19 cmH20   Intake/Output Summary (Last 24 hours) at 06/27/2019 1153 Last data filed at 06/27/2019 1031 Gross per 24 hour  Intake 3990.93 ml  Output 1925 ml  Net 2065.93 ml   Filed Weights   06/25/19 0500 06/26/19 0213 06/27/19 0409  Weight: 130.5 kg 132.6 kg 132.9 kg    Examination: General: chronically ill appearing, elderly gentleman lying in bed in NAD HEENT: MM pink/moist, ETT, anicteric  Neuro: sedate, nods to questions  CV: s1s2 wide QRS, ? Intermittent V pacing, tachy, no m/r/g PULM:  Non-labored on PSV, lungs bilaterally clear anterior  GI: soft, bsx4 active  Extremities: warm/dry, trace edema  Skin: no rashes, ecchymosis on right upper arm   Assessment & Plan:   Hypotension - resolved  Adrenal Insufficiency  Enterococcus UTI  Initial low index of suspicion for septic shock with  negative PCT, flat lactic acid.  UA negative but UC growing enterococcus which may be a colonization.  Cortisol low which raises concern for adrenal insufficiency. BP improved after stress dose steroids. Question component of cardiogenic shock as well with new slight reduction of LVEF to 35-40% (was 40-45%).   -D5 of abx, stop 3/10  Cardiogenic Pulmonary Edema Stable Left Pleural Effusion  CXR with stable left pleural effusion, mild edema  -follow intermittent CXR -lasix 40 mg IVx1    Acute Metabolic Encephalopathy Suspect multifactorial in the setting of metabolic  acidosis, hypercarbia, & hypoglycemia.  CT Head negative -stop sedation in anticipation for extubation   CAD s/p PCI in 2008 with DES  Ischemic cardiomyopathy, combined systolic and diastolic dysfunction, s/p BiV ICD V-Pacing am 3/9  A-fib on Coumadin HTN HLD -tele monitoring  -continue coumadin per pharmacy  -trend INR  -add IV lopressor 5 mg IV Q6, ~ reduced dose from home dosing   -hold home PO entresto, lasix for now   Coagulopathy  Thrombocytopenia  AKI, AMS, renal failure, thrombocytopenia on admit, r/o TTP, HUS -trend LFT's  -coumadin per pharmacy   OSA  3L O2 at baseline -will need nocturnal CPAP post extubation   AKI on CKD III Non gap Metabolic Acidosis  Baseline Cr 1.4 CKD with 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 bicarbonate loss.  FENa 2.29%.  Renal US negative.  -Trend BMP / urinary output -Replace electrolytes as indicated -Avoid nephrotoxic agents, ensure adequate renal perfusion  DM  Hypoglycemia  -may need D5 while TF off / NPO -SSI, resistant scale   At Risk Malnutrition  -NPO post extubation   Best practice:  Diet: NPO, TF  Pain/Anxiety/Delirium protocol (if indicated): precedex  VAP protocol (if indicated): in place  DVT prophylaxis: Coumadin for Afib  GI prophylaxis: PPI  Glucose control: as above Mobility: Bedrest Code Status: Full Code Family Communication: Wife [name]  [phone].  Updated 3/10 at bedside.  Disposition: ICU  Labs   CBC: Recent Labs  Lab 06/23/19 1552 06/23/19 1552 06/24/19 0304 06/25/19 0215 06/25/19 0308 06/25/19 2297 06/25/19 0753 06/25/19 1125 06/26/19 0221 06/26/19 0640 06/27/19 0423  WBC 9.7  --  12.1*  --  15.4*  --   --   --  10.7*  10.8*  --  10.6*  NEUTROABS 8.2*  --   --   --   --   --   --   --  9.9*  --   --   HGB 11.2*   < > 11.2*   < > 10.0*   < > 9.5* 9.9* 10.2*  10.1* 10.9* 9.5*  HCT 37.5*   < > 38.3*   < > 33.8*   < > 28.0* 29.0* 33.4*  33.2* 32.0* 30.8*  MCV 93.1  --  94.6  --  94.2  --   --   --   90.0  90.7  --  90.6  PLT 86*  --  PLATELET CLUMPS NOTED ON SMEAR, COUNT APPEARS DECREASED  --  97*  --   --   --  87*  87*  --  121*   < > = values in this interval not displayed.    Basic Metabolic Panel: Recent Labs  Lab 06/24/19 0304 06/24/19 0351 06/25/19 0215 06/25/19 0308 06/25/19 0434 06/25/19 1125 06/25/19 1753 06/26/19 0221 06/26/19 0640 06/27/19 0423  NA 145  --    < > 144   < > 147* 142 142 145 148*  K 5.5*  --    < >  5.1   < > 4.5 4.7 4.8 4.7 3.9  CL 117*  --   --  116*  --   --  114* 112*  --  114*  CO2 19*  --   --  18*  --   --  20* 21*  --  25  GLUCOSE 64*  --   --  44*  --   --  127* 272*  --  125*  BUN 62*  --   --  63*  --   --  62* 64*  --  79*  CREATININE 1.72*  --   --  2.03*  --   --  1.88* 1.76*  --  1.62*  CALCIUM 8.3*  --   --  8.0*  --   --  7.8* 8.1*  --  8.3*  MG  --  1.9  --   --   --   --   --   --   --   --   PHOS  --  6.0*  --   --   --   --   --   --   --   --    < > = values in this interval not displayed.   GFR: Estimated Creatinine Clearance: 50.1 mL/min (A) (by C-G formula based on SCr of 1.62 mg/dL (H)). Recent Labs  Lab 06/23/19 1552 06/23/19 2125 06/24/19 0304 06/24/19 7846 06/25/19 0308 06/25/19 0314 06/26/19 0208 06/26/19 0221 06/27/19 0423  PROCALCITON  --   --  <0.10  --   --   --   --   --   --   WBC   < >  --  12.1*  --  15.4*  --   --  10.7*  10.8* 10.6*  LATICACIDVEN  --  0.7  --  0.5  --  0.4* 0.6  --   --    < > = values in this interval not displayed.    Liver Function Tests: Recent Labs  Lab 06/23/19 1727 06/24/19 0304 06/25/19 0308 06/27/19 0423  AST 39 35 21 13*  ALT 54* 57* 44 25  ALKPHOS 101 115 118 101  BILITOT 0.6 0.7 0.5 0.6  PROT 6.0* 6.2* 5.8* 5.4*  ALBUMIN 3.0* 3.0* 2.7* 2.2*   No results for input(s): LIPASE, AMYLASE in the last 168 hours. No results for input(s): AMMONIA in the last 168 hours.  ABG    Component Value Date/Time   PHART 7.344 (L) 06/26/2019 0640   PCO2ART 39.5  06/26/2019 0640   PO2ART 70.0 (L) 06/26/2019 0640   HCO3 21.4 06/26/2019 0640   TCO2 23 06/26/2019 0640   ACIDBASEDEF 4.0 (H) 06/26/2019 0640   O2SAT 92.0 06/26/2019 0640     Coagulation Profile: Recent Labs  Lab 06/23/19 1727 06/24/19 1150 06/25/19 0308 06/26/19 0221 06/27/19 0423  INR 1.2 1.7* 2.4* 3.4* 3.3*    Cardiac Enzymes: No results for input(s): CKTOTAL, CKMB, CKMBINDEX, TROPONINI in the last 168 hours.  HbA1C: Hgb A1c MFr Bld  Date/Time Value Ref Range Status  06/25/2019 03:15 PM 8.3 (H) 4.8 - 5.6 % Final    Comment:    (NOTE) Pre diabetes:          5.7%-6.4% Diabetes:              >6.4% Glycemic control for   <7.0% adults with diabetes   03/28/2019 08:19 PM 8.7 (H) 4.8 - 5.6 % Final    Comment:    (NOTE)  Pre diabetes:          5.7%-6.4% Diabetes:              >6.4% Glycemic control for   <7.0% adults with diabetes     CBG: Recent Labs  Lab 06/26/19 2021 06/27/19 0007 06/27/19 0422 06/27/19 0432 06/27/19 0745  GLUCAP 129* 116* 109* 106* 112*      Critical care time: 32 minutes    Noe Gens, MSN, NP-C [facility] 06/27/2019, 11:53 AM   Please see [email or web address] for pager details.    PCCM attending:  79 year old gentleman, shock state secondary to enterococcal UTI, possible adrenal insufficiency, cardiogenic component with known depressed ejection fraction.  Also found to have some mild pulmonary edema small left pleural effusion.  Cephalopathy resolved.  Needs CAD status post PCI 2008 with DES BiV pacer.  BP (!) 170/69 (BP Location: Left Arm)   Pulse 97   Temp 98.1 F (36.7 C) (Bladder)   Resp 10   Ht 5\' 8"  (1.727 m)   Wt 132.9 kg   SpO2 98%   BMI 44.55 kg/m   General: Elderly male, intubated critically ill mechanical life support. Neck: Endotracheal tube in place Heart: Regular rhythm S1-S2  Lungs: Bilateral mechanically ventilated breath sounds  Labs: Reviewed, serum creatinine 1.62, sodium 148 Chest  x-ray: Small left pleural effusion, resolving pulmonary edema bilateral infiltrate. The patient's images have been independently reviewed by me.    Assessment: Shock, hypotension Adrenal insufficiency Sepsis enterococcal UTI possible septic shock Cardiogenic component, bilateral pulmonary edema  Plan: Patient tolerating SBT SAT this morning. Plans for liberation from mechanical ventilator Continue to observe in intensive care unit for respiratory status post extubation. Holding some of his heart failure regimen at this time Continue Coumadin per pharmacy She will dose of diuresis today Likely start tapering stress dose steroids.  This patient is critically ill with multiple organ system failure; which, requires frequent high complexity decision making, assessment, support, evaluation, and titration of therapies. This was completed through the application of advanced monitoring technologies and extensive interpretation of multiple databases. During this encounter critical care time was devoted to patient care services described in this note for 33 minutes.  [facility] 06/27/2019 6:03 PM

## 2019-06-28 ENCOUNTER — Inpatient Hospital Stay (HOSPITAL_COMMUNITY): Payer: Medicare HMO

## 2019-06-28 DIAGNOSIS — A4181 Sepsis due to Enterococcus: Principal | ICD-10-CM

## 2019-06-28 LAB — CULTURE, BLOOD (ROUTINE X 2)
Culture: NO GROWTH
Culture: NO GROWTH
Special Requests: ADEQUATE
Special Requests: ADEQUATE

## 2019-06-28 LAB — BASIC METABOLIC PANEL
Anion gap: 10 (ref 5–15)
BUN: 76 mg/dL — ABNORMAL HIGH (ref 8–23)
CO2: 27 mmol/L (ref 22–32)
Calcium: 8.6 mg/dL — ABNORMAL LOW (ref 8.9–10.3)
Chloride: 114 mmol/L — ABNORMAL HIGH (ref 98–111)
Creatinine, Ser: 1.4 mg/dL — ABNORMAL HIGH (ref 0.61–1.24)
GFR calc Af Amer: 55 mL/min — ABNORMAL LOW (ref >60)
GFR calc non Af Amer: 48 mL/min — ABNORMAL LOW (ref >60)
Glucose, Bld: 123 mg/dL — ABNORMAL HIGH (ref 70–99)
Potassium: 4.2 mmol/L (ref 3.5–5.1)
Sodium: 151 mmol/L — ABNORMAL HIGH (ref 135–145)

## 2019-06-28 LAB — GLUCOSE, CAPILLARY
Glucose-Capillary: 101 mg/dL — ABNORMAL HIGH (ref 70–99)
Glucose-Capillary: 112 mg/dL — ABNORMAL HIGH (ref 70–99)
Glucose-Capillary: 116 mg/dL — ABNORMAL HIGH (ref 70–99)
Glucose-Capillary: 126 mg/dL — ABNORMAL HIGH (ref 70–99)
Glucose-Capillary: 95 mg/dL (ref 70–99)
Glucose-Capillary: 97 mg/dL (ref 70–99)

## 2019-06-28 LAB — CBC
HCT: 29.5 % — ABNORMAL LOW (ref 39.0–52.0)
Hemoglobin: 8.6 g/dL — ABNORMAL LOW (ref 13.0–17.0)
MCH: 27.4 pg (ref 26.0–34.0)
MCHC: 29.2 g/dL — ABNORMAL LOW (ref 30.0–36.0)
MCV: 93.9 fL (ref 80.0–100.0)
Platelets: 109 10*3/uL — ABNORMAL LOW (ref 150–400)
RBC: 3.14 MIL/uL — ABNORMAL LOW (ref 4.22–5.81)
RDW: 14.2 % (ref 11.5–15.5)
WBC: 8.6 10*3/uL (ref 4.0–10.5)
nRBC: 0 % (ref 0.0–0.2)

## 2019-06-28 LAB — PROTIME-INR
INR: 2.6 — ABNORMAL HIGH (ref 0.8–1.2)
Prothrombin Time: 27.9 seconds — ABNORMAL HIGH (ref 11.4–15.2)

## 2019-06-28 MED ORDER — WARFARIN SODIUM 3 MG PO TABS
3.0000 mg | ORAL_TABLET | Freq: Once | ORAL | Status: AC
  Administered 2019-06-28: 3 mg via ORAL
  Filled 2019-06-28: qty 1

## 2019-06-28 MED ORDER — FUROSEMIDE 10 MG/ML IJ SOLN
40.0000 mg | Freq: Once | INTRAMUSCULAR | Status: AC
  Administered 2019-06-28: 40 mg via INTRAVENOUS
  Filled 2019-06-28: qty 4

## 2019-06-28 MED ORDER — METOPROLOL TARTRATE 25 MG PO TABS
25.0000 mg | ORAL_TABLET | Freq: Two times a day (BID) | ORAL | Status: DC
  Administered 2019-06-28 – 2019-06-29 (×2): 25 mg via ORAL
  Filled 2019-06-28 (×3): qty 1

## 2019-06-28 MED ORDER — TAMSULOSIN HCL 0.4 MG PO CAPS
0.8000 mg | ORAL_CAPSULE | Freq: Every evening | ORAL | Status: DC
  Administered 2019-06-28 – 2019-07-05 (×8): 0.8 mg via ORAL
  Filled 2019-06-28 (×8): qty 2

## 2019-06-28 MED ORDER — FLUOXETINE HCL 20 MG PO CAPS
20.0000 mg | ORAL_CAPSULE | Freq: Every evening | ORAL | Status: DC
  Administered 2019-06-28 – 2019-07-05 (×8): 20 mg via ORAL
  Filled 2019-06-28 (×8): qty 1

## 2019-06-28 MED ORDER — DEXTROSE 5 % IV SOLN: INTRAVENOUS | Status: DC

## 2019-06-28 MED ORDER — QUETIAPINE FUMARATE 50 MG PO TABS
25.0000 mg | ORAL_TABLET | Freq: Every day | ORAL | Status: DC
  Administered 2019-06-28 – 2019-07-05 (×8): 25 mg via ORAL
  Filled 2019-06-28 (×8): qty 1

## 2019-06-28 MED ORDER — SACUBITRIL-VALSARTAN 49-51 MG PO TABS
1.0000 | ORAL_TABLET | Freq: Two times a day (BID) | ORAL | Status: DC
  Administered 2019-06-28 – 2019-06-29 (×3): 1 via ORAL
  Filled 2019-06-28 (×5): qty 1

## 2019-06-28 NOTE — Progress Notes (Signed)
Rehab Admissions Coordinator Note:  Per PT recommendation, patient was screened by Stephania Fragmin for appropriateness for an Inpatient Acute Rehab Consult.  At this time, will follow for tolerance before requesting an order for a formal assessment.     Stephania Fragmin 06/28/2019, 11:51 AM  I can be reached at [phone].

## 2019-06-28 NOTE — Progress Notes (Signed)
ANTICOAGULATION CONSULT NOTE  Pharmacy Consult for Coumadin Indication: atrial fibrillation  Allergies  Allergen Reactions  . Codeine Other (See Comments)    constipation  . Erythromycin-Sulfisoxazole Other (See Comments)    Causes infection in throat and eyes    Patient Measurements: Height: 5\' 8"  (172.7 cm) Weight: 290 lb 9.1 oz (131.8 kg) IBW/kg (Calculated) : 68.4  Vital Signs: Temp: 97.5 F (36.4 C) (03/11 1000) Temp Source: Bladder (03/11 0500) BP: 185/78 (03/11 1000) Pulse Rate: 91 (03/11 1000)  Labs: Recent Labs    06/26/19 0221 06/26/19 0640 06/27/19 0423 06/27/19 0423 06/27/19 2236 06/28/19 0405  HGB 10.2*  10.1*   < > 9.5*   < > 9.5* 8.6*  HCT 33.4*  33.2*   < > 30.8*  --  28.0* 29.5*  PLT 87*  87*  --  121*  --   --  109*  LABPROT 34.4*  --  33.5*  --   --  27.9*  INR 3.4*  --  3.3*  --   --  2.6*  CREATININE 1.76*  --  1.62*  --   --  1.40*   < > = values in this interval not displayed.    Estimated Creatinine Clearance: 57.7 mL/min (A) (by C-G formula based on SCr of 1.4 mg/dL (H)).   Medical History: Past Medical History:  Diagnosis Date  . CAD S/P percutaneous coronary angioplasty    remote PCIs in 1990's- last CFX PCI 2008  . Chronic kidney disease, stage III (moderate)   . Diabetes mellitus with complication (HCC)    with hyperglycemia and diabetic autonomic poly neuropathy  . Gastro-esophageal reflux disease with esophagitis   . Hyperlipidemia   . Hypertension   . Ischemic cardiomyopathy 01/20/2018   St Jude ICD   . Morbid obesity (HCC)   . Obstructive sleep apnea    on CPAP  . Polyneuropathy   . Primary insomnia     Assessment: 79yo male presented to [facility] ED c/o SOB, concern for pulmonary edema, tx'd to [facility] for further evaluation, to continue Coumadin for Afib. Admission INR low at 1.2.  INR today is therapeutic at 2.6. Renal function is improving as today's creatinine is 1.4. H&H has slightly downtrended, today at 8.6/29.5.  Platelets have decreased as well, today at 109.   Patient is currently NPO and is awaiting an SLP evaluation prior to resuming medications PO. Will order a dose of warfarin for tonight but if unable to take oral medications he may likely need to transition to IV heparin pending his INR tomorrow morning.   *PTA dose = 2.5 daily per recent anti-coag visit on 2/25.  Goal of Therapy:  INR 2-3   Plan:  Warfarin 3 mg x 1 tonight if able to take PO  Will re-assess INR in the morning if unable to take PO warfarin tonight  Daily INR and CBC Follow up speech evaluation    Thank you,   3/25, PharmD PGY-1 Pharmacy Resident   Please check amion for clinical pharmacist contact number

## 2019-06-28 NOTE — Evaluation (Signed)
Clinical/Bedside Swallow Evaluation Patient Details  Name: Noah [name] MRN: [id number] Date of Birth: 06/25/1940  Today's Date: 06/28/2019 Time: SLP Start Time (ACUTE ONLY): 1135 SLP Stop Time (ACUTE ONLY): 1146 SLP Time Calculation (min) (ACUTE ONLY): 11 min  Past Medical History:  Past Medical History:  Diagnosis Date  . CAD S/P percutaneous coronary angioplasty    remote PCIs in 1990's- last CFX PCI 2008  . Chronic kidney disease, stage III (moderate)   . Diabetes mellitus with complication (HCC)    with hyperglycemia and diabetic autonomic poly neuropathy  . Gastro-esophageal reflux disease with esophagitis   . Hyperlipidemia   . Hypertension   . Ischemic cardiomyopathy 01/20/2018   St Jude ICD   . Morbid obesity ([place])   . Obstructive sleep apnea    on CPAP  . Polyneuropathy   . Primary insomnia    Past Surgical History:  Past Surgical History:  Procedure Laterality Date  . BIV ICD INSERTION CRT-D N/A 01/20/2018   Procedure: BIV ICD INSERTION CRT-D;  Surgeon: Evans Lance, MD;  Location: [facility];  Service: Cardiovascular;  Laterality: N/A;  . BIV PACEMAKER INSERTION CRT-P  01/20/2018   St Jude- Dr Lovena Le  . CARDIAC CATHETERIZATION     multiple angioplasty X 8, 4 stents    HPI:  Pt is a 79 yr old with PMHx CAD s/p PCI in 2008, ischemic cardiomyopathy, combined systolic and diastolic dysfunction, s/p BiV ICD, A fib, DM, HTN, GERD, HLD, OSA, and CKD Stage 3 who presented to [facility] ED on 3/6 with hypothermia and hypotension. Received 2.5 L IVF subsequent CXR showed pulmonary edema, started on Levophed gtt and transferred to [facility]. Chest x-ray 3/11: Low lung volumes with evidence of mild congestive heart failure. ETT 3/8-3/10   Assessment / Plan / Recommendation Clinical Impression  Pt was seen for bedside swallow evaluation. He initially reported a history of oropharyngeal dysphagia but then stated that he only started having difficulty since  admission. RN removed pr from BiPAP and placed him on HFNC for the evaluation since there was question as to whether he would be able to tolerate oral medications. Oral mechanism exam was limited due to pt's difficulty/willingness to follow commands; however, oral motor strength and ROM appeared grossly WFL and dentition was adequate.   He tolerated puree solids and ice chips without overt s/sx of aspiration. Coughing was inconsistently noted with thin liquids via tsp and consistently with liquids via cup, suggesting possible aspiration. Additional trials were deferred since pt started stating, "I need more! Give me more!" Upon clarification regarding more of what, he indicated that he wanted more oxygen and that he felt dyspneic. O2 sats and RR were within normal range at that time and RN was advised of the pt's request. It is recommended that pt's NPO status be maintained at this time. Should pt continue to need BiPAP, short-term non-oral alimentation may be needed. SLP will follow up to assess improvement in swallow function and the need for an instrumental assessment.  SLP Visit Diagnosis: Dysphagia, pharyngeal phase (R13.13)    Aspiration Risk  Mild aspiration risk;Moderate aspiration risk    Diet Recommendation NPO;Ice chips PRN after oral care   Medication Administration: Crushed with puree Postural Changes: Seated upright at 90 degrees    Other  Recommendations Oral Care Recommendations: Oral care QID   Follow up Recommendations Other (comment)(TBD)      Frequency and Duration min 2x/week  2 weeks  Prognosis Prognosis for Safe Diet Advancement: Fair Barriers to Reach Goals: Motivation;Behavior      Swallow Study   General Date of Onset: 06/27/19 HPI: Pt is a 79 yr old with PMHx CAD s/p PCI in 2008, ischemic cardiomyopathy, combined systolic and diastolic dysfunction, s/p BiV ICD, A fib, DM, HTN, GERD, HLD, OSA, and CKD Stage 3 who presented to [facility] ED on 3/6 with  hypothermia and hypotension. Received 2.5 L IVF subsequent CXR showed pulmonary edema, started on Levophed gtt and transferred to [facility]. Chest x-ray 3/11: Low lung volumes with evidence of mild congestive heart failure. ETT 3/8-3/10 Type of Study: Bedside Swallow Evaluation Previous Swallow Assessment: None Diet Prior to this Study: NPO Temperature Spikes Noted: No Respiratory Status: Other (comment)(HFNC for evaluation; on BiPAP most consistently) History of Recent Intubation: Yes Length of Intubations (days): 2 days Date extubated: 07/07/19 Behavior/Cognition: Alert;Impulsive;Requires cueing Oral Cavity Assessment: Within Functional Limits Oral Care Completed by SLP: No Oral Cavity - Dentition: Adequate natural dentition Vision: Functional for self-feeding Self-Feeding Abilities: Total assist Patient Positioning: Upright in bed;Postural control adequate for testing Baseline Vocal Quality: Hoarse(MIldly) Volitional Swallow: Able to elicit    Oral/Motor/Sensory Function Overall Oral Motor/Sensory Function: Within functional limits   Ice Chips Ice chips: Within functional limits Presentation: Spoon   Thin Liquid Thin Liquid: Impaired Presentation: Cup;Spoon Pharyngeal  Phase Impairments: Cough - Delayed;Cough - Immediate    Nectar Thick Nectar Thick Liquid: Not tested   Honey Thick Honey Thick Liquid: Not tested   Puree Puree: Within functional limits Presentation: Spoon   Solid     Solid: Not tested     [place] I. Vear Clock, MS, CCC-SLP Acute Rehabilitation Services Office number [phone] Pager [phone]  [name] 06/28/2019,12:18 PM

## 2019-06-28 NOTE — Progress Notes (Signed)
Physical Therapy Treatment Patient Details Name: Noah [name] MRN: [id number] DOB: Sep 19, 1940 Today's Date: 06/28/2019    History of Present Illness 79 y/o M who presented to [facility] on 3/6 with reports of SOB, found to by hypotensive and hypothermic, treated for sepsis. Pt required intubation on 3/8 due to letahrgy and hypercarbia. Extubated on 3/10.    PT Comments    Pt was limited by reports of SOB and dizziness during session along with confusion. Pt continues to require physical assistance to perform all mobility, however is able to progress to limited OOB mobility this session. Pt remains confused and mildly impulsive, increasing his fall risk. Pt will benefit from aggressive mobilization to improve activity tolerance and reduce caregiver burden. PT updating recommendations to SNF as patient remains with limited activity tolerance.   Follow Up Recommendations  SNF;Supervision/Assistance - 24 hour     Equipment Recommendations  (defer to post-acute setting)    Recommendations for Other Services       Precautions / Restrictions Precautions Precautions: Fall Restrictions Weight Bearing Restrictions: No    Mobility  Bed Mobility Overal bed mobility: Needs Assistance Bed Mobility: Supine to Sit;Sit to Supine Rolling: Mod assist   Supine to sit: Min assist        Transfers Overall transfer level: Needs assistance Equipment used: 1 person hand Lapier assist Transfers: Sit to/from Stand Sit to Stand: Mod assist            Ambulation/Gait Ambulation/Gait assistance: Mod assist Gait Distance (Feet): 2 Feet Assistive device: 1 person hand Bisch assist Gait Pattern/deviations: Shuffle Gait velocity: reduced Gait velocity interpretation: <1.31 ft/sec, indicative of household ambulator General Gait Details: small shuffling steps to right side to advance toward head of bed   Stairs             Wheelchair Mobility    Modified Rankin (Stroke Patients Only)        Balance Overall balance assessment: Needs assistance Sitting-balance support: Single extremity supported;Feet supported Sitting balance-Leahy Scale: Poor Sitting balance - Comments: minA sitting at edge of bed   Standing balance support: Bilateral upper extremity supported Standing balance-Leahy Scale: Poor Standing balance comment: min-modA with BUE support of PT                            Cognition Arousal/Alertness: Awake/alert Behavior During Therapy: Flat affect Overall Cognitive Status: Impaired/Different from baseline Area of Impairment: Orientation;Memory;Following commands;Safety/judgement                 Orientation Level: Disoriented to;Time;Situation   Memory: Decreased recall of precautions;Decreased short-term memory Following Commands: Follows one step commands consistently;Follows one step commands with increased time Safety/Judgement: Decreased awareness of safety;Decreased awareness of deficits     General Comments: pt is confused and somewhat impulsive, requiring frequent PT redirection during mobility      Exercises      General Comments General comments (skin integrity, edema, etc.): pt on 8L HFNC, reporting SOB although saturating well during session, VSS      Pertinent Vitals/Pain Pain Assessment: No/denies pain    Home Living                      Prior Function            PT Goals (current goals can now be found in the care plan section) Acute Rehab PT Goals Patient Stated Goal: To improve mobility Progress towards PT goals:  Progressing toward goals    Frequency    Min 2X/week      PT Plan Discharge plan needs to be updated    Co-evaluation              AM-PAC PT "6 Clicks" Mobility   Outcome Measure  Help needed turning from your back to your side while in a flat bed without using bedrails?: A Lot Help needed moving from lying on your back to sitting on the side of a flat bed without  using bedrails?: A Lot Help needed moving to and from a bed to a chair (including a wheelchair)?: A Lot Help needed standing up from a chair using your arms (e.g., wheelchair or bedside chair)?: A Lot Help needed to walk in hospital room?: A Lot Help needed climbing 3-5 steps with a railing? : Total 6 Click Score: 11    End of Session Equipment Utilized During Treatment: Oxygen Activity Tolerance: Other (comment)(pt reporting dizziness, SOB) Patient left: in bed;with call bell/phone within reach;with bed alarm set Nurse Communication: Mobility status PT Visit Diagnosis: Muscle weakness (generalized) (M62.81)     Time: 7989-2119 PT Time Calculation (min) (ACUTE ONLY): 19 min  Charges:  $Therapeutic Activity: 8-22 mins                     Noah [name], PT, DPT Acute Rehabilitation Pager: [phone]    Noah [name] 06/28/2019, 5:26 PM

## 2019-06-28 NOTE — Progress Notes (Addendum)
NAME:  Noah [name], MRN:  [id number], DOB:  25-Oct-1940, LOS: 4 ADMISSION DATE:  06/23/2019, CONSULTATION DATE: 06/24/2019 REFERRING MD:  Fredia Sorrow, MD CHIEF COMPLAINT:  SOB    Brief History   79 y/o M who presented to [facility] on 3/6 with reports of SOB.   The patient was admitted Jan 2021 for encephalopathy thought secondary to volume depletion in the setting of antihypertensives and discharge to a SNF.  Apparently was noted to have AF 3/5 and contacted by an Therapist, sports for phone encounter.  During that call he reported trouble walking and was disoriented compared to baseline.  EMS activated with ER evaluation 3/6 found him to be hypotension and hypothermia. He was treated as sepsis with IVF, empiric abx and levophed. Transferred to [facility] 3/6. Lactic acid remained <2, PCT negative. Patient developed worsening lethargy, hypercarbia am 3/8 requiring intubation.   Past Medical History  CAD s/p PCI in 2008 with DES  Ischemic cardiomyopathy, combined systolic and diastolic dysfunction, s/p BiV ICD A-fib on Coumadin HTN HLD DM GERD OSA on 3L O2  CKD Stage 3  [facility] - 2020, on [facility]   3/06 Admit  3/08 Intubated for hypercarbic respiratory failure 3/09 Intermittent agitation overnight  Consults:     Procedures:  ETT 3/8 >>  R Radial ALine 3/7 >>  Significant Diagnostic Tests:   ECHO 3/8 >> LVEF 35-40%, technically difficult study, inferoseptal, inferolateral and apical hypokinesis, overall moderate LV dysfunction, mild MR, RWMA noted in LV, RV systolic function normal, moderately elevated PA systolic pressure  CT Head 3/8 >> mild chronic ischemic white matter disease, no acute abnormality   Micro Data:  RVP 3/6 >> negative  COVID 3/6 >> negative  Influenza A/B 3/6 >> negative  BCx2 3/6 >> negative  UC 3/6 >> enterococcus faecalis >> pan sensitive   Antimicrobials:  Vanco 3/6 >> 3/8 Cefepime 3/6 >> 3/9 Unasyn 3/9 >> 3/10  Subjective   RN reports pt  wore bipap overnight Wore 6L  for several hours this am but asked for bipap   Afebrile  Glucose range 95-123 I/O 2.8L UOP, -2L in 24 hours   Objective   Blood pressure (!) 152/74, pulse 94, temperature 98.6 F (37 C), resp. rate 13, height 5\' 8"  (1.727 m), weight 131.8 kg, SpO2 94 %.    Vent Mode: BIPAP FiO2 (%):  [40 %] 40 % Set Rate:  [14 bmp] 14 bmp PEEP:  [5 cmH20] 5 cmH20 Pressure Support:  [5 cmH20] 5 cmH20   Intake/Output Summary (Last 24 hours) at 06/28/2019 1501 Last data filed at 06/28/2019 1400 Gross per 24 hour  Intake 358.05 ml  Output 3100 ml  Net -2741.95 ml   Filed Weights   06/26/19 0213 06/27/19 0409 06/28/19 0500  Weight: 132.6 kg 132.9 kg 131.8 kg    Examination: General: chronically ill appearing adult male lying in bed on BiPAP in NAD HEENT: MM pink/moist, mask in place, anicteric  Neuro: Awakens, alert, asks for medications to make him sleep CV: s1s2 irr irr, no m/r/g PULM:  Non-labored on BiPAP, lungs bilaterally clear anterior, diminished bases  GI: soft, bsx4 active  Extremities: warm/dry, generalized 1+ edema  Skin: no rashes or lesions, bruising to RUE  Assessment & Plan:   Hypotension - resolved  Adrenal Insufficiency  Enterococcus UTI  Initial low index of suspicion for septic shock with negative PCT, flat lactic acid.  UA negative but UC growing enterococcus which may be a colonization.  Cortisol low which raises concern for adrenal insufficiency. BP improved after stress dose steroids. Question component of cardiogenic shock as well with new slight reduction of LVEF to 35-40% (was 40-45%).   -monitor off abx  -follow fever curve / WBC trend   Cardiogenic Pulmonary Edema Stable Left Pleural Effusion  CXR with stable left pleural effusion, mild edema  -follow intermittent CXR  -lasix 40 mg IV x1   Acute Metabolic Encephalopathy Suspect multifactorial in the setting of metabolic acidosis, hypercarbia, & hypoglycemia.  CT Head  negative -promote sleep-wake cycle  -delirium prevention measures   CAD s/p PCI in 2008 with DES  Ischemic cardiomyopathy, combined systolic and diastolic dysfunction, s/p BiV ICD V-Pacing am 3/9  A-fib on Coumadin HTN HLD -tele monitoring  -coumadin per pharmacy  -follow INR  -add stop time to IV lopressor  -resume lopressor PO, 25 mg BID  -resume home entresto -lasix as above  Coagulopathy  Thrombocytopenia  AKI, AMS, renal failure, thrombocytopenia on admit, r/o TTP, HUS -trend LFT's  -coumadin per pharmacy -monitor for bleeding    OSA  3L O2 at baseline -nocturnal CPAP   AKI on CKD III Hypernatremia  Non gap Metabolic Acidosis  Baseline Cr 1.4 CKD with 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 bicarbonate loss.  FENa 2.29%.  Renal US negative.  -add D5w at 105ml/hr while NPO with hypernatremia  -Trend BMP / urinary output -Replace electrolytes as indicated -Avoid nephrotoxic agents, ensure adequate renal perfusion  DM  Hypoglycemia  -SSI, resistant scale   At Risk Malnutrition  -SLP evaluation appreciated  -NPO x meds with applesauce for now   Best practice:  Diet: NPO, TF  Pain/Anxiety/Delirium protocol (if indicated): n/a VAP protocol (if indicated): n/a  DVT prophylaxis: Coumadin for Afib  GI prophylaxis: PPI  Glucose control: as above Mobility: Bedrest Code Status: Full Code Family Communication: Wife Deondra Wigger  [phone]. Will update on arrival.  Disposition: ICU  Labs   CBC: Recent Labs  Lab 06/23/19 1552 06/23/19 1552 06/24/19 0304 06/25/19 0215 06/25/19 0308 06/25/19 1937 06/26/19 0221 06/26/19 9024 06/27/19 0423 06/27/19 2236 06/28/19 0405  WBC 9.7   < > 12.1*  --  15.4*  --  10.7*  10.8*  --  10.6*  --  8.6  NEUTROABS 8.2*  --   --   --   --   --  9.9*  --   --   --   --   HGB 11.2*   < > 11.2*   < > 10.0*   < > 10.2*  10.1* 10.9* 9.5* 9.5* 8.6*  HCT 37.5*   < > 38.3*   < > 33.8*   < > 33.4*  33.2* 32.0* 30.8* 28.0* 29.5*  MCV 93.1   < > 94.6   --  94.2  --  90.0  90.7  --  90.6  --  93.9  PLT 86*   < > PLATELET CLUMPS NOTED ON SMEAR, COUNT APPEARS DECREASED  --  97*  --  87*  87*  --  121*  --  109*   < > = values in this interval not displayed.    Basic Metabolic Panel: Recent Labs  Lab 06/24/19 0351 06/25/19 0215 06/25/19 0308 06/25/19 0434 06/25/19 1753 06/25/19 1753 06/26/19 0221 06/26/19 0640 06/27/19 0423 06/27/19 2236 06/28/19 0405  NA  --    < > 144   < > 142   < > 142 145 148* 150* 151*  K  --    < >  5.1   < > 4.7   < > 4.8 4.7 3.9 4.3 4.2  CL  --   --  116*  --  114*  --  112*  --  114*  --  114*  CO2  --   --  18*  --  20*  --  21*  --  25  --  27  GLUCOSE  --   --  44*  --  127*  --  272*  --  125*  --  123*  BUN  --   --  63*  --  62*  --  64*  --  79*  --  76*  CREATININE  --   --  2.03*  --  1.88*  --  1.76*  --  1.62*  --  1.40*  CALCIUM  --   --  8.0*  --  7.8*  --  8.1*  --  8.3*  --  8.6*  MG 1.9  --   --   --   --   --   --   --   --   --   --   PHOS 6.0*  --   --   --   --   --   --   --   --   --   --    < > = values in this interval not displayed.   GFR: Estimated Creatinine Clearance: 57.7 mL/min (A) (by C-G formula based on SCr of 1.4 mg/dL (H)). Recent Labs  Lab 06/23/19 1552 06/23/19 2125 06/24/19 0304 06/24/19 0304 06/24/19 2130 06/25/19 0308 06/25/19 0314 06/26/19 0208 06/26/19 0221 06/27/19 0423 06/28/19 0405  PROCALCITON  --   --  <0.10  --   --   --   --   --   --   --   --   WBC   < >  --  12.1*   < >  --  15.4*  --   --  10.7*  10.8* 10.6* 8.6  LATICACIDVEN  --  0.7  --   --  0.5  --  0.4* 0.6  --   --   --    < > = values in this interval not displayed.    Liver Function Tests: Recent Labs  Lab 06/23/19 1727 06/24/19 0304 06/25/19 0308 06/27/19 0423  AST 39 35 21 13*  ALT 54* 57* 44 25  ALKPHOS 101 115 118 101  BILITOT 0.6 0.7 0.5 0.6  PROT 6.0* 6.2* 5.8* 5.4*  ALBUMIN 3.0* 3.0* 2.7* 2.2*   No results for input(s): LIPASE, AMYLASE in the last 168  hours. No results for input(s): AMMONIA in the last 168 hours.  ABG    Component Value Date/Time   PHART 7.362 06/27/2019 2236   PCO2ART 52.0 (H) 06/27/2019 2236   PO2ART 119.0 (H) 06/27/2019 2236   HCO3 29.6 (H) 06/27/2019 2236   TCO2 31 06/27/2019 2236   ACIDBASEDEF 4.0 (H) 06/26/2019 0640   O2SAT 98.0 06/27/2019 2236     Coagulation Profile: Recent Labs  Lab 06/24/19 1150 06/25/19 0308 06/26/19 0221 06/27/19 0423 06/28/19 0405  INR 1.7* 2.4* 3.4* 3.3* 2.6*    Cardiac Enzymes: No results for input(s): CKTOTAL, CKMB, CKMBINDEX, TROPONINI in the last 168 hours.  HbA1C: Hgb A1c MFr Bld  Date/Time Value Ref Range Status  06/25/2019 03:15 PM 8.3 (H) 4.8 - 5.6 % Final    Comment:    (NOTE) Pre diabetes:  5.7%-6.4% Diabetes:              >6.4% Glycemic control for   <7.0% adults with diabetes   03/28/2019 08:19 PM 8.7 (H) 4.8 - 5.6 % Final    Comment:    (NOTE) Pre diabetes:          5.7%-6.4% Diabetes:              >6.4% Glycemic control for   <7.0% adults with diabetes     CBG: Recent Labs  Lab 06/27/19 2033 06/28/19 0033 06/28/19 0337 06/28/19 0840 06/28/19 1207  GLUCAP 132* 101* 116* 97 95      Critical care time:      Canary Brim, MSN, NP-C [facility] 06/28/2019, 3:01 PM   Please see [email or web address] for pager details.    PCCM attending:  This is 79 year old male admitted for hypotension, possible adrenal insufficiency enterococcal UTI, acute on chronic systolic heart failure, cardiogenic pulmonary edema, encephalopathy.  Patient improved with antimicrobials diuresis and mechanical support.  Extubated yesterday to BiPAP.  Blood pressure 127/64, pulse 96, temperature 99.1 F (37.3 C), resp. rate 12, height 5\' 8"  (1.727 m), weight 131.8 kg, SpO2 91 %. General: Elderly debilitated male on BiPAP, critically ill in respiratory failure Neck: No significant JVD, large neck Heart: Regular rhythm S1-S2 Lungs:  Bilateral mechanically ventilated breath sounds  Labs: Reviewed Chest x-ray: Bilateral increased vascular congestion  Assessment: Acute on chronic systolic heart failure Bilateral pulmonary edema Pleural effusion Hypotension, resolved Sepsis possible septic shock, questionable adrenal insufficiency low cortisol Enterococcal UTI  Plan: Diuresis again today Continue on BiPAP as needed Continue antimicrobials for Enterococcus Continue weaning steroids Mains in the ICU for close observation.  At risk for need of reintubation.  This patient is critically ill with multiple organ system failure; which, requires frequent high complexity decision making, assessment, support, evaluation, and titration of therapies. This was completed through the application of advanced monitoring technologies and extensive interpretation of multiple databases. During this encounter critical care time was devoted to patient care services described in this note for 32 minutes.  , DO [facility] 06/28/2019 6:09 PM

## 2019-06-28 NOTE — Evaluation (Signed)
Occupational Therapy Evaluation Patient Details Name: Noah [name] MRN: [id number] DOB: Oct 20, 1940 Today's Date: 06/28/2019    History of Present Illness 79 y/o M who presented to [facility] on 3/6 with reports of SOB, found to by hypotensive and hypothermic, treated for sepsis. Pt required intubation on 3/8 due to [place] and hypercarbia. Extubated on 3/10.   Clinical Impression   Pt admitted with above diagnoses, with PLOF taken from chart review 2/2 pt confusion. Pt states he lived at home with spouse and used a cane to get around house. At time of eval, pt completing bed mobility with min A and sit <> stand with mod A. He presents with the cognitive deficits described below and frequently needs multimodal cueing and redirection to complete simple tasks. Given current status, recommend SNF at d/c for continued progression of BADL prior to returning home. Will continue to follow per POC listed below.    Follow Up Recommendations  SNF;Supervision/Assistance - 24 hour    Equipment Recommendations  3 in 1 bedside commode    Recommendations for Other Services       Precautions / Restrictions Precautions Precautions: Fall Restrictions Weight Bearing Restrictions: No      Mobility Bed Mobility Overal bed mobility: Needs Assistance Bed Mobility: Supine to Sit;Sit to Supine Rolling: Mod assist   Supine to sit: Min assist     General bed mobility comments: min A to come to edge of bed for trunk support  Transfers Overall transfer level: Needs assistance Equipment used: 1 person hand Rosengren assist Transfers: Sit to/from Stand Sit to Stand: Mod assist         General transfer comment: mod A to rise and steady    Balance Overall balance assessment: Needs assistance Sitting-balance support: Single extremity supported;Feet supported Sitting balance-Leahy Scale: Poor Sitting balance - Comments: minA sitting at edge of bed   Standing balance support: Bilateral upper extremity  supported Standing balance-Leahy Scale: Poor Standing balance comment: reliant on external support                           ADL either performed or assessed with clinical judgement   ADL Overall ADL's : Needs assistance/impaired Eating/Feeding: NPO   Grooming: Set up;Cueing for sequencing;Cueing for safety;Sitting   Upper Body Bathing: Moderate assistance;Sitting   Lower Body Bathing: Moderate assistance;Sit to/from stand;Sitting/lateral leans   Upper Body Dressing : Moderate assistance;Sitting   Lower Body Dressing: Moderate assistance;Sit to/from stand;Sitting/lateral leans   Toilet Transfer: Moderate assistance;Stand-pivot;BSC;RW   Toileting- Clothing Manipulation and Hygiene: Maximal assistance       Functional mobility during ADLs: Moderate assistance;Rolling walker;Cueing for safety;Cueing for sequencing General ADL Comments: limited by cognitive status and generalized weakness     Vision Baseline Vision/History: Wears glasses Wears Glasses: At all times Patient Visual Report: No change from baseline       Perception     Praxis      Pertinent Vitals/Pain Pain Assessment: No/denies pain     Hand Dominance     Extremity/Trunk Assessment Upper Extremity Assessment Upper Extremity Assessment: Generalized weakness   Lower Extremity Assessment Lower Extremity Assessment: Defer to PT evaluation       Communication Communication Communication: HOH   Cognition Arousal/Alertness: Awake/alert Behavior During Therapy: Flat affect Overall Cognitive Status: Impaired/Different from baseline Area of Impairment: Orientation;Memory;Following commands;Safety/judgement                 Orientation Level: Disoriented to;Time;Situation   Memory:  Decreased recall of precautions;Decreased short-term memory Following Commands: Follows one step commands consistently;Follows one step commands with increased time Safety/Judgement: Decreased awareness of  safety;Decreased awareness of deficits     General Comments: pt is disoriented, impulsive, and needing multimodal cueing to complete multi step tasks   General Comments  pt on 8L HFNC, reporting SOB although saturating well during session, VSS    Exercises     Shoulder Instructions      Home Living Family/patient expects to be discharged to:: Private residence Living Arrangements: Spouse/significant other Available Help at Discharge: Family;Available 24 hours/day Type of Home: House Home Access: Stairs to enter [employer] of Steps: 2 Entrance Stairs-Rails: None Home Layout: Two level;Able to live on main level with bedroom/bathroom Alternate Level Stairs-Number of Steps: 16 Alternate Level Stairs-Rails: Right Bathroom Shower/Tub: Chief Strategy Officer: Standard     Home Equipment: Cane - single point;Walker - 4 wheels   Additional Comments: some history obtained from previous admission due to patient confusion      Prior Functioning/Environment Level of Independence: Independent with assistive device(s)        Comments: pt reports being a household ambulator with cane        OT Problem List: Decreased strength;Decreased knowledge of use of DME or AE;Decreased activity tolerance;Decreased cognition;Impaired balance (sitting and/or standing);Cardiopulmonary status limiting activity      OT Treatment/Interventions: Self-care/ADL training;Therapeutic exercise;Patient/family education;Balance training;Energy conservation;Therapeutic activities;DME and/or AE instruction    OT Goals(Current goals can be found in the care plan section) Acute Rehab OT Goals Patient Stated Goal: improve breathing OT Goal Formulation: With patient Time For Goal Achievement: 07/12/19 Potential to Achieve Goals: Good  OT Frequency: Min 2X/week   Barriers to D/C:            Co-evaluation PT/OT/SLP Co-Evaluation/Treatment: Yes Reason for Co-Treatment: For  patient/therapist safety;To address functional/ADL transfers PT goals addressed during session: Mobility/safety with mobility OT goals addressed during session: ADL's and self-care      AM-PAC OT "6 Clicks" Daily Activity     Outcome Measure Help from another person eating meals?: Total(NPO) Help from another person taking care of personal grooming?: A Little Help from another person toileting, which includes using toliet, bedpan, or urinal?: A Lot Help from another person bathing (including washing, rinsing, drying)?: A Lot Help from another person to put on and taking off regular upper body clothing?: A Lot Help from another person to put on and taking off regular lower body clothing?: A Lot 6 Click Score: 12   End of Session Equipment Utilized During Treatment: Gait belt;Rolling walker Nurse Communication: Mobility status  Activity Tolerance: Patient tolerated treatment well Patient left: in bed;with call bell/phone within reach;with bed alarm set  OT Visit Diagnosis: Unsteadiness on feet (R26.81);Other abnormalities of gait and mobility (R26.89);Muscle weakness (generalized) (M62.81);Other symptoms and signs involving cognitive function                Time: 2951-8841 OT Time Calculation (min): 19 min Charges:  OT General Charges $OT Visit: 1 Visit OT Evaluation $OT Eval Moderate Complexity: 1 Mod  Dalphine Handing, MSOT, OTR/L Acute Rehabilitation Services [facility] Office Number: [phone] Pager: [phone]   Dalphine Handing 06/28/2019, 6:24 PM

## 2019-06-28 NOTE — Progress Notes (Signed)
eLink Physician-Brief Progress Note Patient Name: Noah [name] DOB: 14-Apr-1941 MRN: [id number]   Date of Service  06/28/2019  HPI/Events of Note  Agitation.  eICU Interventions  Will order: 1. Seroquel 25 mg PO Q HS.     Intervention Category Major Interventions: Delirium, psychosis, severe agitation - evaluation and management  Daliya Parchment [place] 06/28/2019, 9:15 PM

## 2019-06-29 ENCOUNTER — Inpatient Hospital Stay (HOSPITAL_COMMUNITY): Payer: Medicare HMO

## 2019-06-29 LAB — GLUCOSE, CAPILLARY
Glucose-Capillary: 115 mg/dL — ABNORMAL HIGH (ref 70–99)
Glucose-Capillary: 116 mg/dL — ABNORMAL HIGH (ref 70–99)
Glucose-Capillary: 130 mg/dL — ABNORMAL HIGH (ref 70–99)
Glucose-Capillary: 141 mg/dL — ABNORMAL HIGH (ref 70–99)
Glucose-Capillary: 146 mg/dL — ABNORMAL HIGH (ref 70–99)
Glucose-Capillary: 151 mg/dL — ABNORMAL HIGH (ref 70–99)
Glucose-Capillary: 96 mg/dL (ref 70–99)

## 2019-06-29 LAB — PROTIME-INR
INR: 2.5 — ABNORMAL HIGH (ref 0.8–1.2)
Prothrombin Time: 26.8 seconds — ABNORMAL HIGH (ref 11.4–15.2)

## 2019-06-29 MED ORDER — MELATONIN 3 MG PO TABS
3.0000 mg | ORAL_TABLET | Freq: Every day | ORAL | Status: DC
  Administered 2019-06-29 – 2019-07-05 (×7): 3 mg via ORAL
  Filled 2019-06-29 (×9): qty 1

## 2019-06-29 MED ORDER — LEVETIRACETAM 750 MG PO TABS
750.0000 mg | ORAL_TABLET | Freq: Two times a day (BID) | ORAL | Status: DC
  Administered 2019-06-29 – 2019-07-06 (×14): 750 mg via ORAL
  Filled 2019-06-29 (×15): qty 1

## 2019-06-29 MED ORDER — WARFARIN SODIUM 2.5 MG PO TABS
2.5000 mg | ORAL_TABLET | Freq: Once | ORAL | Status: AC
  Administered 2019-06-29: 2.5 mg via ORAL
  Filled 2019-06-29: qty 1

## 2019-06-29 MED ORDER — RESOURCE THICKENUP CLEAR PO POWD
ORAL | Status: DC | PRN
  Filled 2019-06-29: qty 125

## 2019-06-29 MED ORDER — POTASSIUM CHLORIDE CRYS ER 20 MEQ PO TBCR
40.0000 meq | EXTENDED_RELEASE_TABLET | Freq: Once | ORAL | Status: AC
  Administered 2019-06-29: 40 meq via ORAL
  Filled 2019-06-29: qty 2

## 2019-06-29 MED ORDER — FUROSEMIDE 10 MG/ML IJ SOLN
40.0000 mg | Freq: Once | INTRAMUSCULAR | Status: AC
  Administered 2019-06-29: 40 mg via INTRAVENOUS
  Filled 2019-06-29: qty 4

## 2019-06-29 NOTE — Plan of Care (Signed)

## 2019-06-29 NOTE — Progress Notes (Signed)
  Speech Language Pathology Treatment: Dysphagia  Patient Details Name: Noah [name] MRN: [id number] DOB: 10/24/1940 Today's Date: 06/29/2019 Time: 8502-7741 SLP Time Calculation (min) (ACUTE ONLY): 11 min  Assessment / Plan / Recommendation Clinical Impression  Pt was seen for dysphagia treatment to assess improvement in swallow function. Nursing reported that the pt has been stable with regards to O2 sats but has been requesting more oxygen possibly due to anxiety. Pt frequently asked, "Am I dying?" and stated, "I feel like I'm dying" but benefited from reassurance regarding his progress thus far. He tolerated puree solids and individual swallows of thin liquids without overt s/sx of aspiration. A reduction in secondary swallows was noted suggesting a possible reduction in pharyngeal residue. Coughing was inconsistently noted with consecutive swallows of thin liquids possibly indicating aspiration. A modified barium swallow study is recommended at this time to further assess swallow function and it is currently scheduled for today at 1400. Pt and RN verbalized agreement with this plan.    HPI HPI: Pt is a 79 yr old with PMHx CAD s/p PCI in 2008, ischemic cardiomyopathy, combined systolic and diastolic dysfunction, s/p BiV ICD, A fib, DM, HTN, GERD, HLD, OSA, and CKD Stage 3 who presented to [facility] ED on 3/6 with hypothermia and hypotension. Received 2.5 L IVF subsequent CXR showed pulmonary edema, started on Levophed gtt and transferred to [facility]. Chest x-ray 3/11: Low lung volumes with evidence of mild congestive heart failure. ETT 3/8-3/10      SLP Plan  New goals to be determined pending instrumental study       Recommendations  Diet recommendations: NPO Medication Administration: Crushed with puree                Oral Care Recommendations: Oral care QID Follow up Recommendations: Other (comment)(TBD) SLP Visit Diagnosis: Dysphagia, pharyngeal phase (R13.13) Plan: New  goals to be determined pending instrumental study       Dellie Piasecki I. Vear Clock, MS, CCC-SLP Acute Rehabilitation Services Office number [phone] Pager [phone]               [name] 06/29/2019, 9:36 AM

## 2019-06-29 NOTE — Progress Notes (Addendum)
NAME:  Noah [name], MRN:  [id number], DOB:  10/29/1940, LOS: 5 ADMISSION DATE:  06/23/2019, CONSULTATION DATE: 06/24/2019 REFERRING MD:  Vanetta Mulders, MD CHIEF COMPLAINT:  SOB    Brief History   79 y/o M who presented to [facility] on 3/6 with reports of SOB.   The patient was admitted Jan 2021 for encephalopathy thought secondary to volume depletion in the setting of antihypertensives and discharge to a SNF.  Apparently was noted to have AF 3/5 and contacted by an Charity fundraiser for phone encounter.  During that call he reported trouble walking and was disoriented compared to baseline.  EMS activated with ER evaluation 3/6 found him to be hypotension and hypothermia. He was treated as sepsis with IVF, empiric abx and levophed. Transferred to [facility] 3/6. Lactic acid remained <2, PCT negative. Patient developed worsening lethargy, hypercarbia am 3/8 requiring intubation.   Past Medical History  CAD s/p PCI in 2008 with DES  Ischemic cardiomyopathy, combined systolic and diastolic dysfunction, s/p BiV ICD A-fib on Coumadin HTN HLD DM GERD OSA on 3L O2  CKD Stage 3  SAH - 2020, on keppra   Significant Hospital Events   3/06 Admit  3/08 Intubated for hypercarbic respiratory failure 3/09 Intermittent agitation overnight  Consults:     Procedures:  ETT 3/8 >> 3/10 R Radial ALine 3/7 >>  Significant Diagnostic Tests:   ECHO 3/8 >> LVEF 35-40%, technically difficult study, inferoseptal, inferolateral and apical hypokinesis, overall moderate LV dysfunction, mild MR, RWMA noted in LV, RV systolic function normal, moderately elevated PA systolic pressure  CT Head 3/8 >> mild chronic ischemic white matter disease, no acute abnormality   Micro Data:  RVP 3/6 >> negative  COVID 3/6 >> negative  Influenza A/B 3/6 >> negative  BCx2 3/6 >> negative  UC 3/6 >> enterococcus faecalis >> pan sensitive   Antimicrobials:  Vanco 3/6 >> 3/8 Cefepime 3/6 >> 3/9 Unasyn 3/9 >> 3/10  Subjective   Continue  episodes of confusion/agitation but appears improved per chart review.   Objective   Blood pressure 125/62, pulse 85, temperature 98.8 F (37.1 C), resp. rate 12, height 5\' 8"  (1.727 m), weight 124.6 kg, SpO2 98 %.    FiO2 (%):  [40 %] 40 %   Intake/Output Summary (Last 24 hours) at 06/29/2019 0947 Last data filed at 06/29/2019 0518 Gross per 24 hour  Intake 744.31 ml  Output 2775 ml  Net -2030.69 ml   Filed Weights   06/27/19 0409 06/28/19 0500 06/29/19 0515  Weight: 132.9 kg 131.8 kg 124.6 kg    Examination: General: Chronically ill appearing elderly male in NAD HEENT: MC/AT, HFNC in place  MM pink/moist, PERRL,  Neuro: Alert and able to answer most questions appropriately but continues to remain confused and agitated at times  CV: s1s2 regular rate and rhythm, no murmur, rubs, or gallops,  PULM:  Clear to ascultation bilaterally, no increased work of breathing, no added breath sounds, oxygen saturations 97-100 on 6Lnc personally decreased oxygen to 4Lnc with no desaturation seen   GI: soft, bowel sounds active in all 4 quadrants, non-tender, non-distended,  Extremities: warm/dry, no edema  Skin: no rashes or lesions   Resolved problems:  Hypotension  Non gap Metabolic Acidosis   Assessment & Plan:   Adrenal Insufficiency  Enterococcus UTI  -Initial low index of suspicion for septic shock with negative PCT, flat lactic acid.  UA negative but UC growing enterococcus which may be a colonization.  Cortisol low which  raises concern for adrenal insufficiency. BP improved after stress dose steroids. Question component of cardiogenic shock as well with new slight reduction of LVEF to 35-40% (was 40-45%).   -Stress dose steroids stopped 3/9 P: Remains stable off antibiotic, follow  Follow fever curve and WBC   Cardiogenic Pulmonary Edema Stable Left Pleural Effusion  -CXR with stable left pleural effusion, mild edema  P: Follow CXR as needed  Repeat lasix dosing Follow  volume status  Strict intake and output   Acute Metabolic Encephalopathy -Suspect multifactorial in the setting of metabolic acidosis, hypercarbia, & hypoglycemia.  CT Head negative P: Delirium precautions Promote sleep-wake cycle  Mobilize as able  Minimize sedation  Move out of the ICU setting when appropriate  Will add nightly melatonin   CAD s/p PCI in 2008 with DES  Ischemic cardiomyopathy, combined systolic and diastolic dysfunction, s/p BiV ICD V-Pacing am 3/9  A-fib on Coumadin HTN HLD P: Continuous telemetry  Coumadin per pharmacy Follow INR Continue PO beta blocker  Continue home Entresto Lasix as above   Coagulopathy  Thrombocytopenia  -AKI, AMS, renal failure, thrombocytopenia on admit, r/o TTP, HUS P: LFT WNL Coumadin per pharmacy  Follow INR Monitor for bleeding  Plt count stable  OSA  3L O2 at baseline P: Nocturnal CPAP as tolerated  Wean supplemental oxygen with goal back to baseline   AKI on CKD III Hypernatremia  -Baseline Cr 1.4 -CKD with 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 bicarbonate loss.  FENa 2.29%.  Renal US negative P: Creatine now appear back to baseline  Continue D5w at 48ml/hr while NPO Trend Bmet Follow renal output  Avoid nephrotoxins   DM  Hypoglycemia  P: SSI CBG checks q4 while NPO  At Risk Malnutrition  P: SLP to preform modified barrium swallow this afternoon  NPO except ice chips   Best practice:  Diet: NPO  Pain/Anxiety/Delirium protocol (if indicated): n/a VAP protocol (if indicated): n/a  DVT prophylaxis: Coumadin for Afib  GI prophylaxis: PPI  Glucose control: as above Mobility: Bedrest Code Status: Full Code Family Communication: Wife Celia Gibbons  [phone]. Will update on arrival.  Disposition: Can likely transfer out of the ICU today   Labs   CBC: Recent Labs  Lab 06/23/19 1552 06/23/19 1552 06/24/19 0304 06/25/19 0215 06/25/19 0308 06/25/19 5621 06/26/19 0221 06/26/19 3086 06/27/19 0423 06/27/19 2236  06/28/19 0405  WBC 9.7   < > 12.1*  --  15.4*  --  10.7*  10.8*  --  10.6*  --  8.6  NEUTROABS 8.2*  --   --   --   --   --  9.9*  --   --   --   --   HGB 11.2*   < > 11.2*   < > 10.0*   < > 10.2*  10.1* 10.9* 9.5* 9.5* 8.6*  HCT 37.5*   < > 38.3*   < > 33.8*   < > 33.4*  33.2* 32.0* 30.8* 28.0* 29.5*  MCV 93.1   < > 94.6  --  94.2  --  90.0  90.7  --  90.6  --  93.9  PLT 86*   < > PLATELET CLUMPS NOTED ON SMEAR, COUNT APPEARS DECREASED  --  97*  --  87*  87*  --  121*  --  109*   < > = values in this interval not displayed.    Basic Metabolic Panel: Recent Labs  Lab 06/24/19 0351 06/25/19 0215 06/25/19 0308 06/25/19 0434 06/25/19 1753  06/25/19 1753 06/26/19 0221 06/26/19 0640 06/27/19 0423 06/27/19 2236 06/28/19 0405  NA  --    < > 144   < > 142   < > 142 145 148* 150* 151*  K  --    < > 5.1   < > 4.7   < > 4.8 4.7 3.9 4.3 4.2  CL  --   --  116*  --  114*  --  112*  --  114*  --  114*  CO2  --   --  18*  --  20*  --  21*  --  25  --  27  GLUCOSE  --   --  44*  --  127*  --  272*  --  125*  --  123*  BUN  --   --  63*  --  62*  --  64*  --  79*  --  76*  CREATININE  --   --  2.03*  --  1.88*  --  1.76*  --  1.62*  --  1.40*  CALCIUM  --   --  8.0*  --  7.8*  --  8.1*  --  8.3*  --  8.6*  MG 1.9  --   --   --   --   --   --   --   --   --   --   PHOS 6.0*  --   --   --   --   --   --   --   --   --   --    < > = values in this interval not displayed.   GFR: Estimated Creatinine Clearance: 55.9 mL/min (A) (by C-G formula based on SCr of 1.4 mg/dL (H)). Recent Labs  Lab 06/23/19 1552 06/23/19 2125 06/24/19 0304 06/24/19 0304 06/24/19 1950 06/25/19 0308 06/25/19 0314 06/26/19 0208 06/26/19 0221 06/27/19 0423 06/28/19 0405  PROCALCITON  --   --  <0.10  --   --   --   --   --   --   --   --   WBC   < >  --  12.1*   < >  --  15.4*  --   --  10.7*  10.8* 10.6* 8.6  LATICACIDVEN  --  0.7  --   --  0.5  --  0.4* 0.6  --   --   --    < > = values in this interval not  displayed.    Liver Function Tests: Recent Labs  Lab 06/23/19 1727 06/24/19 0304 06/25/19 0308 06/27/19 0423  AST 39 35 21 13*  ALT 54* 57* 44 25  ALKPHOS 101 115 118 101  BILITOT 0.6 0.7 0.5 0.6  PROT 6.0* 6.2* 5.8* 5.4*  ALBUMIN 3.0* 3.0* 2.7* 2.2*   No results for input(s): LIPASE, AMYLASE in the last 168 hours. No results for input(s): AMMONIA in the last 168 hours.  ABG    Component Value Date/Time   PHART 7.362 06/27/2019 2236   PCO2ART 52.0 (H) 06/27/2019 2236   PO2ART 119.0 (H) 06/27/2019 2236   HCO3 29.6 (H) 06/27/2019 2236   TCO2 31 06/27/2019 2236   ACIDBASEDEF 4.0 (H) 06/26/2019 0640   O2SAT 98.0 06/27/2019 2236     Coagulation Profile: Recent Labs  Lab 06/25/19 0308 06/26/19 0221 06/27/19 0423 06/28/19 0405 06/29/19 0239  INR 2.4* 3.4* 3.3* 2.6* 2.5*    Cardiac Enzymes: No results for input(s):  CKTOTAL, CKMB, CKMBINDEX, TROPONINI in the last 168 hours.  HbA1C: Hgb A1c MFr Bld  Date/Time Value Ref Range Status  06/25/2019 03:15 PM 8.3 (H) 4.8 - 5.6 % Final    Comment:    (NOTE) Pre diabetes:          5.7%-6.4% Diabetes:              >6.4% Glycemic control for   <7.0% adults with diabetes   03/28/2019 08:19 PM 8.7 (H) 4.8 - 5.6 % Final    Comment:    (NOTE) Pre diabetes:          5.7%-6.4% Diabetes:              >6.4% Glycemic control for   <7.0% adults with diabetes     CBG: Recent Labs  Lab 06/28/19 1627 06/28/19 2035 06/29/19 0055 06/29/19 0336 06/29/19 0807  GLUCAP 112* 126* 141* 115* 96      Critical care time:      Delfin Gant, NP-C [facility] Contact / Pager information can be found on Amion  06/29/2019, 10:12 AM    PCCM attending:  79 year old admitted for encephalopathy, possible adrenal insufficiency, enterococcal UTI, possible acute systolic heart failure bilateral pulmonary edema. Patient was on mechanical support for short period of time liberated to BiPAP.  Tolerated  extubation and doing well in the ICU. Has had some issues with delirium overnight.  BP (!) 96/36   Pulse 82   Temp 99.1 F (37.3 C)   Resp 14   Ht 5\' 8"  (1.727 m)   Wt 124.6 kg   SpO2 100%   BMI 41.77 kg/m   General: Elderly male resting in bed Neck: Trachea midline Heart: Regular rate rhythm S1-S2 Lungs: Bilateral breath sounds Abdomen: Nondistended  Labs: Reviewed Chest x-ray: Reviewed, bilateral vascular congestion. The patient's images have been independently reviewed by me.    Assessment: Enterococcal UTI Pulmonary edema, Acute hypoxemic respiratory failure, resolved Metabolic encephalopathy secondary to above, resolved Confusion at times, possible ICU delirium AKI on CKD 3  Plan: Off antimicrobials Continue diuresis Delirium precautions Stable for transfer from the ICU. Continue BiPAP nightly   , DO [facility] 06/29/2019 3:55 PM

## 2019-06-29 NOTE — Progress Notes (Signed)
  Speech Language Pathology Treatment: Dysphagia  Patient Details Name: Noah [name] MRN: [id number] DOB: 1940/12/01 Today's Date: 06/29/2019 Time: 2025-4270 SLP Time Calculation (min) (ACUTE ONLY): 12 min  Assessment / Plan / Recommendation Clinical Impression  Pt was seen for dysphagia treatment to assess his ability to safely tolerate a p.o. diet. He tolerated 1/2 boluses of puree solids and nectar thick liquids without overt s/sx of aspiration. Coughing was noted with 1/5 full-tsp boluses of puree suggesting possible aspiration. A further reduction in secondary swallows was noted during this session. A dysphagia 1 (puree) diet with nectar thick liquids will be initiated at this time. SLP will follow to ensure diet tolerance and to possibly repeat instrumental assessment.    HPI HPI: Pt is a 79 yr old with PMHx CAD s/p PCI in 2008, ischemic cardiomyopathy, combined systolic and diastolic dysfunction, s/p BiV ICD, A fib, DM, HTN, GERD, HLD, OSA, and CKD Stage 3 who presented to [facility] ED on 3/6 with hypothermia and hypotension. Received 2.5 L IVF subsequent CXR showed pulmonary edema, started on Levophed gtt and transferred to [facility]. Chest x-ray 3/11: Low lung volumes with evidence of mild congestive heart failure. ETT 3/8-3/10      SLP Plan  Goals updated       Recommendations  Diet recommendations: Dysphagia 1 (puree);Nectar-thick liquid Liquids provided via: Straw;Cup Medication Administration: Crushed with puree Supervision: Full supervision/cueing for compensatory strategies Compensations: Slow rate;Small sips/bites;Follow solids with liquid Postural Changes and/or Swallow Maneuvers: Seated upright 90 degrees;Upright 30-60 min after meal                Oral Care Recommendations: Oral care BID Follow up Recommendations: Skilled Nursing facility SLP Visit Diagnosis: Dysphagia, pharyngeal phase (R13.13) Plan: Goals updated       Evolett Somarriba I. Vear Clock, MS,  CCC-SLP Acute Rehabilitation Services Office number [phone] Pager [phone]                [name] 06/29/2019, 4:56 PM

## 2019-06-29 NOTE — Progress Notes (Addendum)
Objective Swallowing Evaluation: Type of Study: MBS-Modified Barium Swallow Study   Patient Details  Name: Noah [name] MRN: [id number] Date of Birth: 1941-03-03  Today's Date: 06/29/2019 Time: SLP Start Time (ACUTE ONLY): 1505 -SLP Stop Time (ACUTE ONLY): 1520  SLP Time Calculation (min) (ACUTE ONLY): 15 min   Past Medical History:  Past Medical History:  Diagnosis Date  . CAD S/P percutaneous coronary angioplasty    remote PCIs in 1990's- last CFX PCI 2008  . Chronic kidney disease, stage III (moderate)   . Diabetes mellitus with complication (HCC)    with hyperglycemia and diabetic autonomic poly neuropathy  . Gastro-esophageal reflux disease with esophagitis   . Hyperlipidemia   . Hypertension   . Ischemic cardiomyopathy 01/20/2018   St Jude ICD   . Morbid obesity (HCC)   . Obstructive sleep apnea    on CPAP  . Polyneuropathy   . Primary insomnia    Past Surgical History:  Past Surgical History:  Procedure Laterality Date  . BIV ICD INSERTION CRT-D N/A 01/20/2018   Procedure: BIV ICD INSERTION CRT-D;  Surgeon: Marinus Maw, MD;  [facility] INVASIVE CV LAB;  Service: Cardiovascular;  Laterality: N/A;  . BIV PACEMAKER INSERTION CRT-P  01/20/2018   St Jude- Dr Ladona Ridgel  . CARDIAC CATHETERIZATION     multiple angioplasty X 8, 4 stents    HPI: Pt is a 79 yr old with PMHx CAD s/p PCI in 2008, ischemic cardiomyopathy, combined systolic and diastolic dysfunction, s/p BiV ICD, A fib, DM, HTN, GERD, HLD, OSA, and CKD Stage 3 who presented to [facility] ED on 3/6 with hypothermia and hypotension. Received 2.5 L IVF subsequent CXR showed pulmonary edema, started on Levophed gtt and transferred to [facility]. Chest x-ray 3/11: Low lung volumes with evidence of mild congestive heart failure. ETT 3/8-3/10  Limited MBS completed 3/12 secondary to pt's shoulders blocking larynx.  Pt diet advanced to regular/thin yesterday.  Today visit to assess po tolerance of advancement and  for pt education.    Assessment / Plan / Recommendation  CHL IP CLINICAL IMPRESSIONS 07/04/2019  Clinical Impression Pt was seen in radiology suite for modified barium swallow study. Visualization of the pt's airway was obscured due to the pt's shoulders. SLP attempted to reposition pt to facilitate a better view; however, pt ultimately requested that the study be concluded due to his fatigue. Pt was advised that it should not take long to try to reposition him further but he stated, "I can't do this anymore". Trials were limited to thin liquids for this reason but a pharyngeal delay and possible penetration (PAS 3) of thin liquids were noted. Aspiration could not be ruled out due to suboptimal visualization. SLP will see pt at bedside to assess his ability to safely tolerate nectar thick liquids start a p.o. diet. A repeat instrumental assessment may be completed next week if clinically indicated.   SLP Visit Diagnosis Dysphagia, pharyngeal phase (R13.13)  Attention and concentration deficit following --  Frontal lobe and executive function deficit following --  Impact on safety and function Mild aspiration risk;Moderate aspiration risk      CHL IP TREATMENT RECOMMENDATION 06/29/2019  Treatment Recommendations Therapy as outlined in treatment plan below     Prognosis 06/29/2019  Prognosis for Safe Diet Advancement Fair  Barriers to Reach Goals Motivation;Behavior  Barriers/Prognosis Comment --    CHL IP DIET RECOMMENDATION 07/04/2019  SLP Diet Recommendations NPO  Liquid Administration via --  Medication Administration Crushed  with puree  Compensations Slow rate;Small sips/bites  Postural Changes --      CHL IP OTHER RECOMMENDATIONS 07/04/2019  Recommended Consults --  Oral Care Recommendations Oral care BID  Other Recommendations --      CHL IP FOLLOW UP RECOMMENDATIONS 07/03/2019  Follow up Recommendations Skilled Nursing facility      [facility] IP FREQUENCY AND DURATION 06/29/2019   Speech Therapy Frequency (ACUTE ONLY) min 2x/week  Treatment Duration 2 weeks           CHL IP ORAL PHASE 06/29/2019  Oral Phase WFL  Oral - Pudding Teaspoon --  Oral - Pudding Cup --  Oral - Honey Teaspoon --  Oral - Honey Cup --  Oral - Nectar Teaspoon --  Oral - Nectar Cup --  Oral - Nectar Straw --  Oral - Thin Teaspoon --  Oral - Thin Cup --  Oral - Thin Straw --  Oral - Puree --  Oral - Mech Soft --  Oral - Regular --  Oral - Multi-Consistency --  Oral - Pill --  Oral Phase - Comment --    CHL IP PHARYNGEAL PHASE 06/29/2019  Pharyngeal Phase Impaired  Pharyngeal- Pudding Teaspoon --  Pharyngeal --  Pharyngeal- Pudding Cup --  Pharyngeal --  Pharyngeal- Honey Teaspoon --  Pharyngeal --  Pharyngeal- Honey Cup --  Pharyngeal --  Pharyngeal- Nectar Teaspoon --  Pharyngeal --  Pharyngeal- Nectar Cup --  Pharyngeal --  Pharyngeal- Nectar Straw --  Pharyngeal --  Pharyngeal- Thin Teaspoon --  Pharyngeal --  Pharyngeal- Thin Cup Penetration/Aspiration before swallow;Penetration/Aspiration during swallow;Delayed swallow initiation-vallecula;Delayed swallow initiation-pyriform sinuses  Pharyngeal Material enters airway, remains ABOVE vocal cords and not ejected out  Pharyngeal- Thin Straw Penetration/Aspiration before swallow;Penetration/Aspiration during swallow;Delayed swallow initiation-vallecula;Delayed swallow initiation-pyriform sinuses  Pharyngeal Material enters airway, remains ABOVE vocal cords and not ejected out  Pharyngeal- Puree --  Pharyngeal --  Pharyngeal- Mechanical Soft --  Pharyngeal --  Pharyngeal- Regular --  Pharyngeal --  Pharyngeal- Multi-consistency --  Pharyngeal --  Pharyngeal- Pill --  Pharyngeal --  Pharyngeal Comment --     CHL IP CERVICAL ESOPHAGEAL PHASE 06/29/2019  Cervical Esophageal Phase (No Data)  Pudding Teaspoon --  Pudding Cup --  Honey Teaspoon --  Honey Cup --  Nectar Teaspoon --  Nectar Cup --  Nectar Straw --   Thin Teaspoon --  Thin Cup --  Thin Straw --  Puree --  Mechanical Soft --  Regular --  Multi-consistency --  Pill --  Cervical Esophageal Comment --   Taijon Vink I. Hardin Negus, [place], [place] Office number [phone] Pager [place] 07/04/2019, 8:54 AM

## 2019-06-29 NOTE — Progress Notes (Signed)
Leotis Shames requested a [place] or [employer]. This Chaplain passed off the request to the Spiritual Care Assistant Director Rayfield Citizen who is working on contacting a Dispensing optician for [employer]. Chaplain remains available for support as needs arise.   Chaplain Resident, Amado Coe, M Div [phone] on-call pager

## 2019-06-29 NOTE — Progress Notes (Signed)
ANTICOAGULATION CONSULT NOTE  Pharmacy Consult for Coumadin Indication: atrial fibrillation  Allergies  Allergen Reactions  . Codeine Other (See Comments)    constipation  . Erythromycin-Sulfisoxazole Other (See Comments)    Causes infection in throat and eyes    Patient Measurements: Height: 5\' 8"  (172.7 cm) Weight: 274 lb 11.1 oz (124.6 kg) IBW/kg (Calculated) : 68.4  Vital Signs: Temp: 99.1 F (37.3 C) (03/12 1100) BP: 102/86 (03/12 1100) Pulse Rate: 80 (03/12 1100)  Labs: Recent Labs    06/27/19 0423 06/27/19 0423 06/27/19 2236 06/28/19 0405 06/29/19 0239  HGB 9.5*   < > 9.5* 8.6*  --   HCT 30.8*  --  28.0* 29.5*  --   PLT 121*  --   --  109*  --   LABPROT 33.5*  --   --  27.9* 26.8*  INR 3.3*  --   --  2.6* 2.5*  CREATININE 1.62*  --   --  1.40*  --    < > = values in this interval not displayed.    Estimated Creatinine Clearance: 55.9 mL/min (A) (by C-G formula based on SCr of 1.4 mg/dL (H)).   Medical History: Past Medical History:  Diagnosis Date  . CAD S/P percutaneous coronary angioplasty    remote PCIs in 1990's- last CFX PCI 2008  . Chronic kidney disease, stage III (moderate)   . Diabetes mellitus with complication (HCC)    with hyperglycemia and diabetic autonomic poly neuropathy  . Gastro-esophageal reflux disease with esophagitis   . Hyperlipidemia   . Hypertension   . Ischemic cardiomyopathy 01/20/2018   St Jude ICD   . Morbid obesity (HCC)   . Obstructive sleep apnea    on CPAP  . Polyneuropathy   . Primary insomnia     Assessment: 79yo male presented to [facility] ED c/o SOB, concern for pulmonary edema, tx'd to [facility] for further evaluation, to continue Coumadin for Afib. Admission INR low at 1.2.  INR today is therapeutic at 2.5. No cbc today. No bleeding noted.    *PTA dose = 2.5 daily per recent anti-coag visit on 2/25.  Goal of Therapy:  INR 2-3   Plan:  Warfarin 2.5 mg x 1 tonight  Daily INR, check cbc in am   Thank you,   3/25 PharmD., BCPS Clinical Pharmacist 06/29/2019 2:05 PM

## 2019-06-30 DIAGNOSIS — R1310 Dysphagia, unspecified: Secondary | ICD-10-CM

## 2019-06-30 DIAGNOSIS — N390 Urinary tract infection, site not specified: Secondary | ICD-10-CM

## 2019-06-30 LAB — GLUCOSE, CAPILLARY
Glucose-Capillary: 117 mg/dL — ABNORMAL HIGH (ref 70–99)
Glucose-Capillary: 123 mg/dL — ABNORMAL HIGH (ref 70–99)
Glucose-Capillary: 152 mg/dL — ABNORMAL HIGH (ref 70–99)
Glucose-Capillary: 208 mg/dL — ABNORMAL HIGH (ref 70–99)

## 2019-06-30 LAB — BASIC METABOLIC PANEL
Anion gap: 8 (ref 5–15)
BUN: 55 mg/dL — ABNORMAL HIGH (ref 8–23)
CO2: 29 mmol/L (ref 22–32)
Calcium: 8.7 mg/dL — ABNORMAL LOW (ref 8.9–10.3)
Chloride: 105 mmol/L (ref 98–111)
Creatinine, Ser: 1.34 mg/dL — ABNORMAL HIGH (ref 0.61–1.24)
GFR calc Af Amer: 58 mL/min — ABNORMAL LOW (ref >60)
GFR calc non Af Amer: 50 mL/min — ABNORMAL LOW (ref >60)
Glucose, Bld: 340 mg/dL — ABNORMAL HIGH (ref 70–99)
Potassium: 4.3 mmol/L (ref 3.5–5.1)
Sodium: 142 mmol/L (ref 135–145)

## 2019-06-30 LAB — CBC
HCT: 28.1 % — ABNORMAL LOW (ref 39.0–52.0)
Hemoglobin: 8.1 g/dL — ABNORMAL LOW (ref 13.0–17.0)
MCH: 27.7 pg (ref 26.0–34.0)
MCHC: 28.8 g/dL — ABNORMAL LOW (ref 30.0–36.0)
MCV: 96.2 fL (ref 80.0–100.0)
Platelets: 102 10*3/uL — ABNORMAL LOW (ref 150–400)
RBC: 2.92 MIL/uL — ABNORMAL LOW (ref 4.22–5.81)
RDW: 13.5 % (ref 11.5–15.5)
WBC: 7.7 10*3/uL (ref 4.0–10.5)
nRBC: 0 % (ref 0.0–0.2)

## 2019-06-30 LAB — PROTIME-INR
INR: 2.7 — ABNORMAL HIGH (ref 0.8–1.2)
Prothrombin Time: 28.4 seconds — ABNORMAL HIGH (ref 11.4–15.2)

## 2019-06-30 MED ORDER — FUROSEMIDE 10 MG/ML IJ SOLN
40.0000 mg | Freq: Once | INTRAMUSCULAR | Status: AC
  Administered 2019-06-30: 40 mg via INTRAVENOUS
  Filled 2019-06-30: qty 4

## 2019-06-30 MED ORDER — LEVALBUTEROL HCL 1.25 MG/0.5ML IN NEBU: 1.2500 mg | INHALATION_SOLUTION | Freq: Four times a day (QID) | RESPIRATORY_TRACT | Status: DC

## 2019-06-30 MED ORDER — LEVALBUTEROL HCL 1.25 MG/0.5ML IN NEBU: 1.2500 mg | INHALATION_SOLUTION | Freq: Three times a day (TID) | RESPIRATORY_TRACT | Status: DC

## 2019-06-30 MED ORDER — FUROSEMIDE 10 MG/ML IJ SOLN
40.0000 mg | Freq: Every day | INTRAMUSCULAR | Status: DC
  Administered 2019-06-30: 40 mg via INTRAVENOUS
  Filled 2019-06-30: qty 4

## 2019-06-30 MED ORDER — LEVALBUTEROL HCL 1.25 MG/0.5ML IN NEBU
1.2500 mg | INHALATION_SOLUTION | Freq: Four times a day (QID) | RESPIRATORY_TRACT | Status: AC
  Administered 2019-06-30 – 2019-07-01 (×3): 1.25 mg via RESPIRATORY_TRACT
  Filled 2019-06-30 (×3): qty 0.5

## 2019-06-30 MED ORDER — METOPROLOL TARTRATE 12.5 MG HALF TABLET
12.5000 mg | ORAL_TABLET | Freq: Two times a day (BID) | ORAL | Status: DC
  Administered 2019-06-30 – 2019-07-02 (×6): 12.5 mg via ORAL
  Filled 2019-06-30 (×6): qty 1

## 2019-06-30 MED ORDER — WARFARIN SODIUM 2.5 MG PO TABS
2.5000 mg | ORAL_TABLET | Freq: Once | ORAL | Status: AC
  Administered 2019-06-30: 2.5 mg via ORAL
  Filled 2019-06-30: qty 1

## 2019-06-30 NOTE — Progress Notes (Signed)
Spoke with patient's daughter, Acie Fredrickson [phone]) wants to know if patient would benefit from a psych consult or behavior management.  She states that his dependency and hollering out has gotten progressively worse over the past several months.  She states family is at "their wits end" and doesn't know where to go from here.  MD notified of family's concerns.

## 2019-06-30 NOTE — Plan of Care (Signed)

## 2019-06-30 NOTE — Progress Notes (Addendum)
PROGRESS NOTE        PATIENT DETAILS Name: Noah [name] Age: 79 y.o. Sex: male Date of Birth: August 03, 1940 Admit Date: 06/23/2019 Admitting Physician Carin Hock, MD WUJ:[id number], Clarene Critchley, MD  Brief Narrative: Patient is a 79 y.o. male with past medical history of CAD s/p PCI in 2008, ischemic cardiomyopathy, combined chronic systolic and diastolic heart failure, s/p BiV ICD, A. fib on anticoagulation, DM, HTN, GERD, HLD, OSA-3 L nocturnally, CKD stage IIIa who presented to [facility] on 2/6 with hypothermia and hypotension.  Chest x-ray showed pulmonary edema, patient was started on Levophed infusion and transferred to [facility] course complicated by worsening somnolence, hypercarbia- required intubation on 3/8.  Significant events: 3/06 Admit  3/08 Intubated for hypercarbic respiratory failure 3/08 TTE-EF 35-40% 3/09 Intermittent agitation overnight 3/13 transfer to [facility]  Antimicrobial therapy: Vancomycin: 3/6>> 3/7 Cefepime: 3/6>> 3/8 Unasyn: 3/9>> 3/10  Microbiology data: 3/6 influenza/Covid: Negative 3/6 blood cultures: Negative 3/6 urine culture: Enterococcus faecalis 3/7 MRSA PCR negative  Procedures : ETT 3/8 >> 3/10 R Radial ALine 3/7 >>  Consults: P[facility]M Cardiology on 3/13  DVT Prophylaxis : Warfarin  Subjective: Wants water-claims his breathing is not at baseline-however appears comfortable.  Per RN staff-waxing/waning confusion. He is on 3 L of oxygen.  He denies any chest pain or shortness of breath.  Blood pressure is in the 90s systolic this morning.  Assessment/Plan: Shock/hypotension: Not felt to be secondary to septic shock-felt to be a combination of ?relative adrenal insufficiency and perhaps cardiogenic shock (EF 35-40%).  BP improved after initiation of stress dose steroids-cortisol on 3/7-low for critical clinical situation-hydrocortisone discontinued on 3/9.  BP remains soft-in the 90s this morning-hold  Entresto for now-decrease metoprolol to 12.5 mg twice daily.  If BP remains low-may need to perform a ACTH stimulation test once steroid washout is complete  Enterococcus UTI: Has completed a course of treatment-follow fever curve and WBC.  Acute metabolic encephalopathy: Secondary to metabolic acidosis, hypercarbia-CT head negative.  Had delirium post-Extubation while in the ICU-currently is relatively awake and alert-and is following simple commands.  Continue with supportive care-on seroquel qhs.  Acute on chronic hypercarbic/hypoxic respiratory failure: Secondary to decompensated systolic/diastolic heart failure.  Intubated for worsening hypercarbia on 3/8-extubated on 3/10.  On BiPAP at night for OSA-currently stable on 3 L.  Acute on chronic systolic/diastolic heart failure (EF 35-40% by TTE on 3/8):1+ edema-3 L of oxygen-has a few bibasilar rales-soft blood pressure limits use of diuretics today.  Stop Entresto given blood pressure only in the 90s this morning-decrease metoprolol to 12.5 mg twice daily.  Have consulted cardiology.  Atrial fibrillation: Telemetry with paced rhythm-rate controlled with metoprolol-Coumadin per pharmacy.  INR therapeutic.  AKI on CKD stage IIIa: AKI hemodynamically mediated-creatinine close to baseline.  UA negative for proteinuria, renal ultrasound on 3/9 neg for hydronephrosis.  Follow electrolytes periodically.  OSA with chronic hypoxic respiratory failure on 3 L of oxygen at baseline: CPAP nightly as tolerated  DM-2: CBGs stable-SSI  Recent Labs    06/29/19 2031 06/29/19 2326 06/30/19 0525  GLUCAP 151* 146* 117*    Dysphagia: SLP following-started on dysphagia 1 diet on 3/12  Deconditioning/debility: Secondary to acute illness-suspect some amount of frailty at baseline.  Evaluated by rehab services-plans are for SNF on discharge.  Nutrition Problem: Nutrition Problem: Increased nutrient needs Etiology:  acute illness Signs/Symptoms: estimated  needs Interventions: Tube feeding, Prostat  Obesity: Estimated body mass index is 41.77 kg/m as calculated from the following:   Height as of this encounter: 5\' 8"  (1.727 m).   Weight as of this encounter: 124.6 kg.    Diet: Diet Order            DIET - DYS 1 Room service appropriate? Yes with Assist; Fluid consistency: Nectar Thick  Diet effective now               Code Status: Full code   Family Communication: Spoke with daughter over the phone 3/13-she will update rest of the family members  Disposition Plan: SNF when ready for discharge  Barriers to Discharge: ICU delirium, dysphagia-ongoing decompensated heart failure   Antimicrobial agents: Anti-infectives (From admission, onward)   Start     Dose/Rate Route Frequency Ordered Stop   06/26/19 1800  vancomycin (VANCOREADY) IVPB 1500 mg/300 mL  Status:  Discontinued     1,500 mg 150 mL/hr over 120 Minutes Intravenous Every 48 hours 06/25/19 1002 06/25/19 1431   06/26/19 1000  Ampicillin-Sulbactam (UNASYN) 3 g in sodium chloride 0.9 % 100 mL IVPB  Status:  Discontinued     3 g 200 mL/hr over 30 Minutes Intravenous Every 6 hours 06/26/19 0919 06/27/19 1337   06/24/19 2200  ceFEPIme (MAXIPIME) 2 g in sodium chloride 0.9 % 100 mL IVPB  Status:  Discontinued     2 g 200 mL/hr over 30 Minutes Intravenous Every 12 hours 06/24/19 1134 06/26/19 0851   06/24/19 1800  vancomycin (VANCOREADY) IVPB 1500 mg/300 mL  Status:  Discontinued     1,500 mg 150 mL/hr over 120 Minutes Intravenous Every 24 hours 06/23/19 1759 06/25/19 1002   06/24/19 0100  ceFEPIme (MAXIPIME) 2 g in sodium chloride 0.9 % 100 mL IVPB  Status:  Discontinued     2 g 200 mL/hr over 30 Minutes Intravenous Every 8 hours 06/23/19 1759 06/24/19 1134   06/23/19 1800  vancomycin (VANCOREADY) IVPB 2000 mg/400 mL     2,000 mg 200 mL/hr over 120 Minutes Intravenous  Once 06/23/19 1759 06/23/19 2056   06/23/19 1700  ceFEPIme (MAXIPIME) 2 g in sodium chloride 0.9 %  100 mL IVPB     2 g 200 mL/hr over 30 Minutes Intravenous  Once 06/23/19 1655 06/23/19 1741   06/23/19 1700  metroNIDAZOLE (FLAGYL) IVPB 500 mg     500 mg 100 mL/hr over 60 Minutes Intravenous  Once 06/23/19 1655 06/23/19 1849   06/23/19 1700  vancomycin (VANCOCIN) IVPB 1000 mg/200 mL premix  Status:  Discontinued     1,000 mg 200 mL/hr over 60 Minutes Intravenous  Once 06/23/19 1655 06/23/19 1759      Time spent: 25 minutes-Greater than 50% of this time was spent in counseling, explanation of diagnosis, planning of further management, and coordination of care.  MEDICATIONS: Scheduled Meds: . Chlorhexidine Gluconate Cloth  6 each Topical Daily  . FLUoxetine  20 mg Oral QPM  . insulin aspart  0-20 Units Subcutaneous Q4H  . levETIRAcetam  750 mg Oral BID  . mouth rinse  15 mL Mouth Rinse BID  . Melatonin  3 mg Oral QHS  . metoprolol tartrate  25 mg Oral BID  . QUEtiapine  25 mg Oral QHS  . sacubitril-valsartan  1 tablet Oral BID  . sodium chloride flush  10-40 mL Intracatheter Q12H  . tamsulosin  0.8 mg Oral QPM  . Warfarin -  Pharmacist Dosing Inpatient   Does not apply q1800   Continuous Infusions: . dextrose 40 mL/hr at 06/30/19 0400   PRN Meds:.Resource ThickenUp Clear, sodium chloride flush   PHYSICAL EXAM: Vital signs: Vitals:   06/29/19 2322 06/30/19 0000 06/30/19 0400 06/30/19 0523  BP: (!) 115/59   (!) 97/44  Pulse:  78 76 71  Resp:  16 12 14   Temp: 97.8 F (36.6 C)     TempSrc: Oral     SpO2:  99% 100% 98%  Weight:      Height:       Filed Weights   06/27/19 0409 06/28/19 0500 06/29/19 0515  Weight: 132.9 kg 131.8 kg 124.6 kg   Body mass index is 41.77 kg/m.   Gen Exam:Alert awake-not in any distress HEENT:atraumatic, normocephalic Chest: B/L rales CVS:S1S2 regular Abdomen:soft non tender, non distended Extremities:no edema Neurology: Non focal Skin: no rash  I have personally reviewed following labs and imaging studies  LABORATORY  DATA: CBC: Recent Labs  Lab 06/23/19 1552 06/24/19 0304 06/25/19 0308 06/25/19 0434 06/26/19 0221 06/26/19 0221 06/26/19 1157 06/27/19 0423 06/27/19 2236 06/28/19 0405 06/30/19 0607  WBC 9.7   < > 15.4*  --  10.7*  10.8*  --   --  10.6*  --  8.6 7.7  NEUTROABS 8.2*  --   --   --  9.9*  --   --   --   --   --   --   HGB 11.2*   < > 10.0*   < > 10.2*  10.1*   < > 10.9* 9.5* 9.5* 8.6* 8.1*  HCT 37.5*   < > 33.8*   < > 33.4*  33.2*   < > 32.0* 30.8* 28.0* 29.5* 28.1*  MCV 93.1   < > 94.2  --  90.0  90.7  --   --  90.6  --  93.9 96.2  PLT 86*   < > 97*  --  87*  87*  --   --  121*  --  109* 102*   < > = values in this interval not displayed.    Basic Metabolic Panel: Recent Labs  Lab 06/24/19 0351 06/25/19 0215 06/25/19 1753 06/25/19 1753 06/26/19 0221 06/26/19 0221 06/26/19 2620 06/27/19 0423 06/27/19 2236 06/28/19 0405 06/30/19 3559  NA  --    < > 142   < > 142   < > 145 148* 150* 151* 142  K  --    < > 4.7   < > 4.8   < > 4.7 3.9 4.3 4.2 4.3  CL  --    < > 114*  --  112*  --   --  114*  --  114* 105  CO2  --    < > 20*  --  21*  --   --  25  --  27 29  GLUCOSE  --    < > 127*  --  272*  --   --  125*  --  123* 340*  BUN  --    < > 62*  --  64*  --   --  79*  --  76* 55*  CREATININE  --    < > 1.88*  --  1.76*  --   --  1.62*  --  1.40* 1.34*  CALCIUM  --    < > 7.8*  --  8.1*  --   --  8.3*  --  8.6* 8.7*  MG 1.9  --   --   --   --   --   --   --   --   --   --   PHOS 6.0*  --   --   --   --   --   --   --   --   --   --    < > = values in this interval not displayed.    GFR: Estimated Creatinine Clearance: 58.4 mL/min (A) (by C-G formula based on SCr of 1.34 mg/dL (H)).  Liver Function Tests: Recent Labs  Lab 06/23/19 1727 06/24/19 0304 06/25/19 0308 06/27/19 0423  AST 39 35 21 13*  ALT 54* 57* 44 25  ALKPHOS 101 115 118 101  BILITOT 0.6 0.7 0.5 0.6  PROT 6.0* 6.2* 5.8* 5.4*  ALBUMIN 3.0* 3.0* 2.7* 2.2*   No results for input(s): LIPASE, AMYLASE  in the last 168 hours. No results for input(s): AMMONIA in the last 168 hours.  Coagulation Profile: Recent Labs  Lab 06/26/19 0221 06/27/19 0423 06/28/19 0405 06/29/19 0239 06/30/19 0607  INR 3.4* 3.3* 2.6* 2.5* 2.7*    Cardiac Enzymes: No results for input(s): CKTOTAL, CKMB, CKMBINDEX, TROPONINI in the last 168 hours.  BNP (last 3 results) No results for input(s): PROBNP in the last 8760 hours.  Lipid Profile: No results for input(s): CHOL, HDL, LDLCALC, TRIG, CHOLHDL, LDLDIRECT in the last 72 hours.  Thyroid Function Tests: No results for input(s): TSH, T4TOTAL, FREET4, T3FREE, THYROIDAB in the last 72 hours.  Anemia Panel: No results for input(s): VITAMINB12, FOLATE, FERRITIN, TIBC, IRON, RETICCTPCT in the last 72 hours.  Urine analysis:    Component Value Date/Time   COLORURINE YELLOW 06/23/2019 2030   APPEARANCEUR CLEAR 06/23/2019 2030   LABSPEC 1.015 06/23/2019 2030   PHURINE 5.0 06/23/2019 2030   GLUCOSEU NEGATIVE 06/23/2019 2030   HGBUR NEGATIVE 06/23/2019 2030   BILIRUBINUR NEGATIVE 06/23/2019 2030   KETONESUR NEGATIVE 06/23/2019 2030   PROTEINUR 30 (A) 06/23/2019 2030   NITRITE NEGATIVE 06/23/2019 2030   LEUKOCYTESUR NEGATIVE 06/23/2019 2030    Sepsis Labs: Lactic Acid, Venous    Component Value Date/Time   LATICACIDVEN 0.6 06/26/2019 0208    MICROBIOLOGY: Recent Results (from the past 240 hour(s))  Blood Culture (routine x 2)     Status: None   Collection Time: 06/23/19  5:28 PM   Specimen: BLOOD RIGHT FOREARM  Result Value Ref Range Status   Specimen Description BLOOD RIGHT FOREARM  Final   Special Requests   Final    BOTTLES DRAWN AEROBIC AND ANAEROBIC Blood Culture adequate volume   Culture   Final    NO GROWTH 5 DAYS Performed at [facility], [address], [place], [place] [address]    Report Status 06/28/2019 FINAL  Final  Blood Culture (routine x 2)     Status: None   Collection Time: 06/23/19  5:33 PM   Specimen: BLOOD RIGHT  HAND  Result Value Ref Range Status   Specimen Description BLOOD RIGHT HAND  Final   Special Requests   Final    BOTTLES DRAWN AEROBIC AND ANAEROBIC Blood Culture adequate volume   Culture   Final    NO GROWTH 5 DAYS Performed at [facility], [address] [place], [place] [address]    Report Status 06/28/2019 FINAL  Final  Respiratory Panel by RT PCR (Flu A&B, Covid) - Nasopharyngeal Swab     Status: None   Collection Time: 06/23/19  6:10 PM   Specimen: Nasopharyngeal Swab  Result Value Ref Range Status   SARS Coronavirus 2 by RT PCR NEGATIVE NEGATIVE Final    Comment: (NOTE) SARS-CoV-2 target nucleic acids are NOT DETECTED. The SARS-CoV-2 RNA is generally detectable in upper respiratoy specimens during the acute phase of infection. The lowest concentration of SARS-CoV-2 viral copies this assay can detect is 131 copies/mL. A negative result does not preclude SARS-Cov-2 infection and should not be used as the sole basis for treatment or other patient management decisions. A negative result may occur with  improper specimen collection/handling, submission of specimen other than nasopharyngeal swab, presence of viral mutation(s) within the areas targeted by this assay, and inadequate number of viral copies (<131 copies/mL). A negative result must be combined with clinical observations, patient history, and epidemiological information. The expected result is Negative. Fact Sheet for Patients:  https://www.moore.com/ Fact Sheet for Healthcare Providers:  https://www.young.biz/ This test is not yet ap proved or cleared by the [place] FDA and  has been authorized for detection and/or diagnosis of SARS-CoV-2 by FDA under an Emergency Use Authorization (EUA). This EUA will remain  in effect (meaning this test can be used) for the duration of the COVID-19 declaration under Section 564(b)(1) of the Act, 21 U.S.C. section 360bbb-3(b)(1), unless  the authorization is terminated or revoked sooner.    Influenza A by PCR NEGATIVE NEGATIVE Final   Influenza B by PCR NEGATIVE NEGATIVE Final    Comment: (NOTE) The Xpert Xpress SARS-CoV-2/FLU/RSV assay is intended as an aid in  the diagnosis of influenza from Nasopharyngeal swab specimens and  should not be used as a sole basis for treatment. Nasal washings and  aspirates are unacceptable for Xpert Xpress SARS-CoV-2/FLU/RSV  testing. Fact Sheet for Patients: https://www.moore.com/ Fact Sheet for Healthcare Providers: https://www.young.biz/ This test is not yet approved or cleared by the [place] FDA and  has been authorized for detection and/or diagnosis of SARS-CoV-2 by  FDA under an Emergency Use Authorization (EUA). This EUA will remain  in effect (meaning this test can be used) for the duration of the  Covid-19 declaration under Section 564(b)(1) of the Act, 21  U.S.C. section 360bbb-3(b)(1), unless the authorization is  terminated or revoked. Performed at [facility], [address] [place], [place] [address]   Urine culture     Status: Abnormal   Collection Time: 06/23/19  8:30 PM   Specimen: In/Out Cath Urine  Result Value Ref Range Status   Specimen Description   Final    IN/OUT CATH URINE Performed at [facility], [address] [place], [place] [address]    Special Requests   Final    NONE Performed at [facility], [address], [place], [place] [address]    Culture 5,000 COLONIES/mL ENTEROCOCCUS FAECALIS (A)  Final   Report Status 06/26/2019 FINAL  Final   Organism ID, Bacteria ENTEROCOCCUS FAECALIS (A)  Final      Susceptibility   Enterococcus faecalis - MIC*    AMPICILLIN <=2 SENSITIVE Sensitive     NITROFURANTOIN <=16 SENSITIVE Sensitive     VANCOMYCIN 1 SENSITIVE Sensitive     * 5,000 COLONIES/mL ENTEROCOCCUS FAECALIS  MRSA PCR Screening     Status: None   Collection Time: 06/24/19  4:02 AM   Specimen:  Nasopharyngeal  Result Value Ref Range Status   MRSA by PCR NEGATIVE NEGATIVE Final    Comment:        The GeneXpert MRSA Assay (FDA approved for NASAL  specimens only), is one component of a comprehensive MRSA colonization surveillance program. It is not intended to diagnose MRSA infection nor to guide or monitor treatment for MRSA infections. Performed at [facility], [address] [place], [place] [address]     RADIOLOGY STUDIES/RESULTS: DG Fluoro Rm 1-60 Min - No Report  Result Date: 06/29/2019 Fluoroscopy was utilized by the requesting physician.  No radiographic interpretation.     LOS: 6 days   Jeoffrey Massed, MD  [facility]    To contact the attending provider between 7A-7P or the covering provider during after hours 7P-7A, please log into the web site [email or web address] and access using universal [place] password for that web site. If you do not have the password, please call the hospital operator.  06/30/2019, 7:09 AM

## 2019-06-30 NOTE — Progress Notes (Signed)
ANTICOAGULATION CONSULT NOTE  Pharmacy Consult for Coumadin Indication: atrial fibrillation  Allergies  Allergen Reactions  . Codeine Other (See Comments)    constipation  . Erythromycin-Sulfisoxazole Other (See Comments)    Causes infection in throat and eyes    Patient Measurements: Height: 5\' 8"  (172.7 cm) Weight: 274 lb 11.1 oz (124.6 kg) IBW/kg (Calculated) : 68.4  Vital Signs: Temp: 97.6 F (36.4 C) (03/13 1043) Temp Source: Oral (03/13 1043) BP: 113/44 (03/13 1222) Pulse Rate: 88 (03/13 1222)  Labs: Recent Labs    06/27/19 2236 06/27/19 2236 06/28/19 0405 06/29/19 0239 06/30/19 0607  HGB 9.5*   < > 8.6*  --  8.1*  HCT 28.0*  --  29.5*  --  28.1*  PLT  --   --  109*  --  102*  LABPROT  --   --  27.9* 26.8* 28.4*  INR  --   --  2.6* 2.5* 2.7*  CREATININE  --   --  1.40*  --  1.34*   < > = values in this interval not displayed.    Estimated Creatinine Clearance: 58.4 mL/min (A) (by C-G formula based on SCr of 1.34 mg/dL (H)).   Medical History: Past Medical History:  Diagnosis Date  . CAD S/P percutaneous coronary angioplasty    remote PCIs in 1990's- last CFX PCI 2008  . Chronic kidney disease, stage III (moderate)   . Diabetes mellitus with complication (HCC)    with hyperglycemia and diabetic autonomic poly neuropathy  . Gastro-esophageal reflux disease with esophagitis   . Hyperlipidemia   . Hypertension   . Ischemic cardiomyopathy 01/20/2018   St Jude ICD   . Morbid obesity (HCC)   . Obstructive sleep apnea    on CPAP  . Polyneuropathy   . Primary insomnia     Assessment: 79yo male presented to [facility] ED c/o SOB, concern for pulmonary edema, tx'd to [facility] for further evaluation, to continue Coumadin for Afib. Admission INR low at 1.2.  INR today is therapeutic at 2.7. CBC stable. No overt bleeding noted.   *PTA dose = 2.5 daily per recent anti-coag visit on 2/25.  Goal of Therapy:  INR 2-3   Plan:  Warfarin 2.5 mg x 1 tonight  Daily  INR, check cbc in am  3/25, PharmD PGY1 Pharmacy Resident Phone: [phone] 06/30/2019  1:50 PM  Please check [email or web address] for unit-specific pharmacy phone numbers.

## 2019-06-30 NOTE — Consult Note (Addendum)
Cardiology Consultation:   Patient ID: Noah [name]; [id number]; 1940/09/09   Admit date: 06/23/2019 Date of Consult: 06/30/2019  Primary Care Provider: Estanislado Pandy, MD Primary Cardiologist: Dr. [name], MD  Patient Profile:   Noah [name] is a 79 y.o. 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 Jerral Ralph.  History of Present Illness:   Noah [name] is a 79 year old male with a history stated above who initially presented to [facility] ED on 06/23/2019 with hypothermia and hypotension. Given this, he was transferred to [facility] for further management, treated for sepsis with IVF and empiric antibiotics.  Given worsening somnolence and hypercarbic respiratory failure he required intubation on 06/25/2019 as well as vasopressor support for hypotension. Echocardiogram performed 06/25/2019 showed EF of 35 to 40%, technically difficult study with inferoseptal, inferolateral and apical hypokinesis, overall moderate LV dysfunction, mild MR, RWMA noted in LV, RV systolic function normal, moderately elevated PA systolic pressure. Respiratory failure felt to be secondary to decompensated systolic/diastolic heart failure.  He was ultimately extubated 06/27/2019 and is no longer requiring vasopressor support although his Sherryll Burger is currently being Steers due to soft BPs.  Noah [name] was last seen by his primary cardiologist, Dr. [name] 05/28/2019 in follow-up.  Appears he was doing well from a CV standpoint at that time.  He was continued on ASA, Entresto, Toprol and simvastatin.  As above, he  underwent [employer] Jude biventricular ICD implantation on 01/20/2018.  He underwent coronary angiography 05/30/2006 and underwent PCI/DES to the LCx.  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  Nuclear stress test from 10/14/2017 showed large prior inferior MI with no significant new ischemia. Echocardiogram from 05/18/2018 demonstrated improvement in LV systolic function up to 40 to 25% with moderate LVH.  There were wall motion abnormalities noted.  ABIs 09/2018 with left-sided tibial disease.  Cardiology has been asked to evaluate for CV management. On my exam, patient has increased work of breathing with communication.  He is tachycardic with low normal BP.  He denies chest pain, palpitations, dizziness or syncope.  Does have LE edema.   Past Medical History:  Diagnosis Date   CAD S/P percutaneous coronary angioplasty    remote PCIs in 1990's- last CFX PCI 2008   Chronic kidney disease, stage III (moderate)    Diabetes mellitus with complication (HCC)    with hyperglycemia and diabetic autonomic poly neuropathy   Gastro-esophageal reflux disease with esophagitis    Hyperlipidemia    Hypertension    Ischemic cardiomyopathy 01/20/2018   St Jude ICD    Morbid obesity (HCC)    Obstructive sleep apnea    on CPAP   Polyneuropathy    Primary insomnia     Past Surgical History:  Procedure Laterality Date   BIV ICD INSERTION CRT-D N/A 01/20/2018   Procedure: BIV ICD INSERTION CRT-D;  Surgeon: Marinus Maw, MD;  Location: [facility] INVASIVE CV LAB;  Service: Cardiovascular;  Laterality: N/A;   BIV PACEMAKER INSERTION CRT-P  01/20/2018  St Jude- Dr Lovena Le   CARDIAC CATHETERIZATION     multiple angioplasty X 8, 4 stents      Prior to Admission medications   Medication Sig Start Date End Date Taking? Authorizing Provider  acetaminophen (TYLENOL) 650 MG CR tablet Take 650-1,300 mg by mouth daily as needed for pain.    Yes [provider]  aspirin EC 81 MG tablet Take 1 tablet (81 mg total) by mouth daily with breakfast. 04/25/19  Yes Emokpae, Courage, MD  cetirizine (ZYRTEC) 10 MG tablet Take 10 mg by mouth daily.   Yes [provider]  Continuous Blood Gluc Sensor (FREESTYLE LIBRE 14 DAY SENSOR) MISC Inject 1 each into the skin every 14 (fourteen) days. Use as directed. 06/01/19  Yes Nida, Marella Chimes, MD  ENTRESTO 49-51 MG Take 1 tablet by mouth 2 (two) times daily. 06/15/19  Yes [provider]  finasteride (PROSCAR) 5 MG tablet Take 5 mg by mouth every evening.  08/13/17  Yes [provider]  FLUoxetine (PROZAC) 20 MG capsule Take 20 mg by mouth every evening.   Yes [provider]  furosemide (LASIX) 40 MG tablet Take 1 tablet (40 mg total) by mouth daily. 04/26/19  Yes Emokpae, Courage, MD  gabapentin (NEURONTIN) 100 MG capsule Take 1 capsule (100 mg total) by mouth 2 (two) times daily. 04/25/19  Yes Emokpae, Courage, MD  glipiZIDE (GLUCOTROL XL) 2.5 MG 24 hr tablet Take 1 tablet (2.5 mg total) by mouth daily with breakfast. 06/11/19  Yes Nida, Marella Chimes, MD  Insulin Glargine-Lixisenatide (SOLIQUA) 100-33 UNT-MCG/ML SOPN Inject 60 Units into the skin daily with breakfast. 06/11/19  Yes Nida, Marella Chimes, MD  levETIRAcetam (KEPPRA) 750 MG tablet Take 750 mg by mouth 2 (two) times daily. 07/10/18  Yes [provider]  metoprolol succinate (TOPROL XL) 50 MG 24 hr tablet Take 1 tablet (50 mg total) by mouth daily. Take with or immediately following a meal. 04/25/19 04/24/20 Yes Emokpae, Courage, MD  Omega-3 Fatty Acids (FISH OIL) 1000 MG CAPS Take 1,000 mg by mouth 2 (two) times daily.    Yes [provider]  pantoprazole (PROTONIX) 40 MG tablet Take 1 tablet (40 mg total) by mouth daily. 04/26/19  Yes Emokpae, Courage, MD  Tamsulosin HCl (FLOMAX) 0.4 MG CAPS Take 0.8 mg by mouth every evening.   Yes [provider]  warfarin (COUMADIN) 5 MG tablet TAKE 1/2  TABLET (2.5MG ) DAILY EXCEPT 1 TABLET (5MG ) ON TUESDAYS, THURSDAYS AND SATURDAYS 06/11/19  Yes Herminio Commons, MD  Zinc 50 MG TABS Take 200 mg by mouth daily.   Yes [provider]    Inpatient Medications: Scheduled Meds:  Chlorhexidine Gluconate Cloth  6 each Topical Daily   FLUoxetine  20 mg Oral QPM   insulin aspart  0-20 Units Subcutaneous Q4H   levETIRAcetam  750 mg Oral BID   mouth rinse  15 mL Mouth Rinse BID   Melatonin  3 mg Oral QHS   metoprolol tartrate  12.5 mg Oral BID   QUEtiapine  25 mg Oral QHS   sodium chloride flush  10-40 mL Intracatheter Q12H   tamsulosin  0.8 mg Oral QPM   Warfarin - Pharmacist Dosing Inpatient   Does not apply q1800   Continuous Infusions:  PRN Meds: Resource ThickenUp Clear, sodium chloride flush  Allergies:    Allergies  Allergen Reactions   Codeine Other (See Comments)    constipation   Erythromycin-Sulfisoxazole Other (See Comments)  Causes infection in throat and eyes    Social History:   Social History   Socioeconomic History   Marital status: Married    Spouse name: Not on file   Number of children: Not on file   Years of education: Not on file   Highest education level: Not on file  Occupational History   Occupation: Retired  Tobacco Use   Smoking status: Former Smoker    Packs/day: 1.00    Years: 30.00    Pack years: 30.00    Quit date: 09/01/2000    Years since quitting: 18.8   Smokeless tobacco: Never Used  Substance and Sexual Activity   Alcohol use: Yes    Alcohol/week: 0.0 standard drinks   Drug use: No   Sexual activity: Not on file  Other Topics Concern   Not on file  Social History Narrative   Not on file   Social Determinants of Health   Financial Resource Strain:    Difficulty of Paying Living Expenses:   Food Insecurity:    Worried About Programme researcher, broadcasting/film/video in the Last Year:    Barista in the Last Year:   Transportation Needs:    Automotive engineer (Medical):    Lack of Transportation (Non-Medical):   Physical Activity:    Days of Exercise per Week:    Minutes of Exercise per Session:   Stress:    Feeling of Stress :   Social Connections:    Frequency of Communication with Friends and Family:    Frequency of Social Gatherings with Friends and Family:    Attends Religious Services:    Active Member of Clubs or Organizations:    Attends Engineer, structural:    Marital Status:   Intimate Partner Violence:    Fear of Current or Ex-Partner:    Emotionally Abused:    Physically Abused:    Sexually Abused:     Family History:   Family History  Problem Relation Age of Onset   Heart attack Father    Breast cancer Mother    CAD Mother    Cancer Brother    Family Status:  Family Status  Relation Name Status   Father  Deceased   Mother  Deceased   Brother  (Not Specified)    ROS:  Please see the history of present illness.  All other ROS reviewed and negative.     Physical Exam/Data:   Vitals:   06/30/19 0523 06/30/19 0745 06/30/19 1043 06/30/19 1222  BP: (!) 97/44 (!) 93/41 (!) 93/51 (!) 113/44  Pulse: 71 79 85 88  Resp: 14 16 18    Temp:   97.6 F (36.4 C)   TempSrc:   Oral   SpO2: 98% 95% 95%   Weight:      Height:        Intake/Output Summary (Last 24 hours) at 06/30/2019 1232 Last data filed at 06/30/2019 0400 Gross per 24 hour  Intake 974.52 ml  Output 1601 ml  Net -626.48 ml   Filed Weights   06/27/19 0409 06/28/19 0500 06/29/19 0515  Weight: 132.9 kg 131.8 kg 124.6 kg   Body mass index is 41.77 kg/m.   General: Obese, , NAD Skin: Warm, dry, intact  Neck: Negative for carotid bruits. No JVD Lungs: Bilateral lower lobe crackles, no wheezes. Breathing is labored. Cardiovascular: RRR with S1 S2. No murmurs Abdomen: Soft, non-tender, non-distended. No obvious abdominal masses. Extremities: 1+ BLE edema. DP/PT pulses 2+  bilaterally Neuro: Alert and  oriented. No focal deficits. No facial asymmetry. MAE spontaneously. Psych: Responds to questions appropriately with normal affect.    EKG:  The EKG was personally reviewed and demonstrates: 06/26/2019 ventricular paced, HR 121 Telemetry:  Telemetry was personally reviewed and demonstrates: 06/30/2019 AV paced, HR 113  Relevant CV Studies:  Echocardiogram: 04/2018 IMPRESSIONS   1. The left ventricle has mild-moderately reduced systolic function of 40-45%. The cavity size is normal. There is moderate left ventricular wall thickness. Echo evidence of unable to assess diastolic filling patterns. 2. Hypokinesis if the mid-apical inferior, inferoseptal, anteroseptal and apical myocardium. 3. Normal left atrial size. 4. Normal right atrial size. 5. Normal tricuspid valve. 6. The inferior vena cava was dilated in size with >50% respiratory variablity. 7. No atrial level shunt detected by color flow Doppler.  CATH:   Laboratory Data:  Chemistry Recent Labs  Lab 06/27/19 0423 06/27/19 0423 06/27/19 2236 06/28/19 0405 06/30/19 0607  NA 148*   < > 150* 151* 142  K 3.9   < > 4.3 4.2 4.3  CL 114*  --   --  114* 105  CO2 25  --   --  27 29  GLUCOSE 125*  --   --  123* 340*  BUN 79*  --   --  76* 55*  CREATININE 1.62*  --   --  1.40* 1.34*  CALCIUM 8.3*  --   --  8.6* 8.7*  GFRNONAA 40*  --   --  48* 50*  GFRAA 46*  --   --  55* 58*  ANIONGAP 9  --   --  10 8   < > = values in this interval not displayed.    Total Protein  Date Value Ref Range Status  06/27/2019 5.4 (L) 6.5 - 8.1 g/dL Final   Albumin  Date Value Ref Range Status  06/27/2019 2.2 (L) 3.5 - 5.0 g/dL Final   AST  Date Value Ref Range Status  06/27/2019 13 (L) 15 - 41 U/L Final   ALT  Date Value Ref Range Status  06/27/2019 25 0 - 44 U/L Final   Alkaline Phosphatase  Date Value Ref Range Status  06/27/2019 101 38 - 126 U/L Final   Total Bilirubin  Date Value Ref Range Status  06/27/2019 0.6 0.3 -  1.2 mg/dL Final   Hematology Recent Labs  Lab 06/27/19 0423 06/27/19 0423 06/27/19 2236 06/28/19 0405 06/30/19 0607  WBC 10.6*  --   --  8.6 7.7  RBC 3.40*  --   --  3.14* 2.92*  HGB 9.5*   < > 9.5* 8.6* 8.1*  HCT 30.8*   < > 28.0* 29.5* 28.1*  MCV 90.6  --   --  93.9 96.2  MCH 27.9  --   --  27.4 27.7  MCHC 30.8  --   --  29.2* 28.8*  RDW 14.3  --   --  14.2 13.5  PLT 121*  --   --  109* 102*   < > = values in this interval not displayed.   Cardiac EnzymesNo results for input(s): TROPONINI in the last 168 hours. No results for input(s): TROPIPOC in the last 168 hours.  BNP Recent Labs  Lab 06/23/19 1728  BNP 374.0*    DDimer  Recent Labs  Lab 06/23/19 1903  DDIMER 0.39   TSH:  Lab Results  Component Value Date   TSH 3.350 06/25/2019   Lipids: Lab Results  Component Value  Date   CHOL  09/09/2007    121        ATP III CLASSIFICATION:  <200     mg/dL   Desirable  162-446  mg/dL   Borderline High  >=950    mg/dL   High   HDL 28 (L) 72/25/7505   LDLCALC  09/09/2007    75        Total Cholesterol/HDL:CHD Risk Coronary Heart Disease Risk Table                     Men   Women  1/2 Average Risk   3.4   3.3   TRIG 156 (H) 06/27/2019   CHOLHDL 4.3 09/09/2007   HgbA1c: Lab Results  Component Value Date   HGBA1C 8.3 (H) 06/25/2019    Radiology/Studies:  US RENAL  Result Date: 07/21/2019 CLINICAL DATA:  Acute kidney injury EXAM: RENAL / URINARY TRACT ULTRASOUND COMPLETE COMPARISON:  Ultrasound 04/16/2019 FINDINGS: Right Kidney: Renal measurements: 11.7 x 6 x 5.7 cm = volume: 206.7 mL . Echogenicity within normal limits. No mass or hydronephrosis visualized. Left Kidney: Renal measurements: 11.6 x 6.6 x 4.7 cm = volume: 186.8 mL. Echogenicity within normal limits. No mass or hydronephrosis visualized. Bladder: Appears nearly empty.  Foley catheter. Other: None. IMPRESSION: Negative for hydronephrosis.  Foley decompression of the bladder. Electronically Signed   By:  Jasmine Pang M.D.   On: 07/21/2019 20:20   DG CHEST PORT 1 VIEW  Result Date: 06/28/2019 CLINICAL DATA:  79 year old male with history of shortness of breath and acute respiratory failure with hypoxia. EXAM: PORTABLE CHEST 1 VIEW COMPARISON:  Chest x-ray 06/27/2019. FINDINGS: Patient has been extubated. Previously noted nasogastric tube has been withdrawn. There is a right upper extremity PICC with tip terminating in the distal superior vena cava. Left-sided biventricular pacemaker/AICD with lead tips projecting over the expected location of the right atrium, right ventricular apex and overlying the the left ventricle via the coronary sinus and coronary veins. Low lung volumes. Patchy multifocal interstitial and airspace disease in the lungs bilaterally. Small bilateral pleural effusions. Cephalization of the pulmonary vasculature. Mild cardiomegaly. Upper mediastinal contours are within normal limits. IMPRESSION: 1. Support apparatus, as above. 2. Low lung volumes with evidence of mild congestive heart failure, as above. Electronically Signed   By: Trudie Reed M.D.   On: 06/28/2019 09:25   DG CHEST PORT 1 VIEW  Result Date: 06/27/2019 CLINICAL DATA:  Respiratory failure EXAM: PORTABLE CHEST 1 VIEW COMPARISON:  2019-07-21 FINDINGS: The endotracheal tube and NG tubes are stable. Persistent cardiac enlargement. Much improved lung aeration suggesting resolving pulmonary edema. There is a persistent small left pleural effusion and left lower lobe atelectasis. IMPRESSION: 1. Stable support apparatus. 2. Improving lung aeration with resolving pulmonary edema. 3. Persistent small left effusion and left basilar atelectasis. Electronically Signed   By: Rudie Meyer M.D.   On: 06/27/2019 09:17   DG Fluoro Rm 1-60 Min - No Report  Result Date: 06/29/2019 Fluoroscopy was utilized by the requesting physician.  No radiographic interpretation.   Assessment and Plan:   1.  Acute on chronic combined systolic  and diastolic CHF/ischemic cardiomyopathy: -Has known reduced LVEF, previously 40 to 45% by echocardiogram however most recent echo shows a slight reduction to 35 to 40% this admission.  He is status post biventricular ICD placement and follows with Dr. Ladona Ridgel.  He was admitted for altered mental status, hypotension and hypothermia followed by PCCM.  Due to somnolence  and hypercarbic respiratory failure, patient was intubated 06/25/2019.  Essentially sepsis work-up has been negative although found to have Enterococcus UTI.  There was concern about decompensated heart failure with cardiogenic shock component given slight reduction in EF to 35 to 40% from prior echocardiogram.  He was ultimately extubated on 06/27/2019 and transferred to progressive care. -CXR shows stable left pleural effusion and mild edema -Previously noted to be taking Lasix 80 mg daily, Toprol XL, Imdur and Entresto.  Sherryll Burger is currently on hold secondary to low BPs. -Currently on metoprolol tartrate 12.5 mg twice daily -Would start IV Lasix 40 mg daily for now and monitor urine output and BP closely -Continue to hold Entresto  2.  CAD status post PCI to LAD and RCA in the 1990s, DES to LCx in 2008: -Most recent nuclear stress test 09/2017 considered low risk with no ischemia -Previously on ASA, noted to be on this along with Coumadin per primary cardiologist, beta-blocker, Imdur and statin therapy  3.  Paroxysmal atrial fibrillation: -Stable, rates moderately elevated in the low 100s despite metoprolol tartrate 12.5 mg twice daily>>> up titration difficult in the setting of soft BPs -Coumadin for anticoagulation -Last device interrogation showed 7.8% AF burden  4.  Hypertension: -Currently having issues with hypotension secondary to questionable cardiogenic shock -113/44, 93/51, 93/41, 97/44 -Continue to metoprolol tartrate 12.5 mg twice daily  5.  HLD:  -Noted to previously be on simvastatin 80 mg?>>  Not on statin therapy  per PTA list -Start statin prior to discharge with close follow-up of lipid panel/LFTs 6 to 8 weeks after initiation  6.  CKD stage III: -Creatinine, 1.34 today -Continue to follow closely with daily lab work -Avoid nephrotoxic medications   For questions or updates, please contact [facility] Please consult [email or web address] for contact info under Cardiology/STEMI.   Raliegh Ip NP-C [facility] Pager: [phone] 06/30/2019 12:32 PM  Complex past history.  Admitted with respiratory failure and hypotension.  But septic evaluation negative.  Required intubation.  Given stress steroids for?  Adrenal insufficiency.  Then off.  Discharge from the ICU with ongoing modest dyspnea.  Increased work of breathing.  Weights are unfortunately useless (272 on admission 293 3/10 and 275 3/12.--Oh well)  He is however considerably better following diuretics per JM-NP  Discussed with hospitalist.  Not sure that I understand the mechanism of his initial collapse--acute CHF does not seem to be to make sense as I don.t normally see that  associated with hypotension and hypothermia   Agree with cortisol stim test   Will continue diuretics for now and with wheezing give him xoponex

## 2019-07-01 ENCOUNTER — Inpatient Hospital Stay (HOSPITAL_COMMUNITY): Payer: Medicare HMO

## 2019-07-01 DIAGNOSIS — E870 Hyperosmolality and hypernatremia: Secondary | ICD-10-CM

## 2019-07-01 DIAGNOSIS — R069 Unspecified abnormalities of breathing: Secondary | ICD-10-CM

## 2019-07-01 LAB — MAGNESIUM: Magnesium: 1.9 mg/dL (ref 1.7–2.4)

## 2019-07-01 LAB — COMPREHENSIVE METABOLIC PANEL
ALT: 19 U/L (ref 0–44)
AST: 15 U/L (ref 15–41)
Albumin: 2.4 g/dL — ABNORMAL LOW (ref 3.5–5.0)
Alkaline Phosphatase: 83 U/L (ref 38–126)
Anion gap: 10 (ref 5–15)
BUN: 49 mg/dL — ABNORMAL HIGH (ref 8–23)
CO2: 32 mmol/L (ref 22–32)
Calcium: 9 mg/dL (ref 8.9–10.3)
Chloride: 109 mmol/L (ref 98–111)
Creatinine, Ser: 1.21 mg/dL (ref 0.61–1.24)
GFR calc Af Amer: >60 mL/min (ref >60)
GFR calc non Af Amer: 57 mL/min — ABNORMAL LOW (ref >60)
Glucose, Bld: 105 mg/dL — ABNORMAL HIGH (ref 70–99)
Potassium: 4.2 mmol/L (ref 3.5–5.1)
Sodium: 151 mmol/L — ABNORMAL HIGH (ref 135–145)
Total Bilirubin: 0.9 mg/dL (ref 0.3–1.2)
Total Protein: 5.6 g/dL — ABNORMAL LOW (ref 6.5–8.1)

## 2019-07-01 LAB — CBC
HCT: 30.3 % — ABNORMAL LOW (ref 39.0–52.0)
Hemoglobin: 8.8 g/dL — ABNORMAL LOW (ref 13.0–17.0)
MCH: 27.6 pg (ref 26.0–34.0)
MCHC: 29 g/dL — ABNORMAL LOW (ref 30.0–36.0)
MCV: 95 fL (ref 80.0–100.0)
Platelets: 117 10*3/uL — ABNORMAL LOW (ref 150–400)
RBC: 3.19 MIL/uL — ABNORMAL LOW (ref 4.22–5.81)
RDW: 13.2 % (ref 11.5–15.5)
WBC: 8.1 10*3/uL (ref 4.0–10.5)
nRBC: 0 % (ref 0.0–0.2)

## 2019-07-01 LAB — GLUCOSE, CAPILLARY
Glucose-Capillary: 103 mg/dL — ABNORMAL HIGH (ref 70–99)
Glucose-Capillary: 108 mg/dL — ABNORMAL HIGH (ref 70–99)
Glucose-Capillary: 129 mg/dL — ABNORMAL HIGH (ref 70–99)
Glucose-Capillary: 136 mg/dL — ABNORMAL HIGH (ref 70–99)
Glucose-Capillary: 163 mg/dL — ABNORMAL HIGH (ref 70–99)
Glucose-Capillary: 196 mg/dL — ABNORMAL HIGH (ref 70–99)
Glucose-Capillary: 94 mg/dL (ref 70–99)

## 2019-07-01 LAB — PROTIME-INR
INR: 2.9 — ABNORMAL HIGH (ref 0.8–1.2)
Prothrombin Time: 30.6 seconds — ABNORMAL HIGH (ref 11.4–15.2)

## 2019-07-01 LAB — BRAIN NATRIURETIC PEPTIDE: B Natriuretic Peptide: 1072.6 pg/mL — ABNORMAL HIGH (ref 0.0–100.0)

## 2019-07-01 MED ORDER — FUROSEMIDE 10 MG/ML IJ SOLN: 40.0000 mg | Freq: Every day | INTRAMUSCULAR | Status: DC

## 2019-07-01 MED ORDER — PANTOPRAZOLE SODIUM 40 MG PO TBEC
40.0000 mg | DELAYED_RELEASE_TABLET | Freq: Every day | ORAL | Status: DC
  Administered 2019-07-01 – 2019-07-06 (×6): 40 mg via ORAL
  Filled 2019-07-01 (×6): qty 1

## 2019-07-01 MED ORDER — DEXTROSE 5 % IV SOLN: INTRAVENOUS | Status: AC

## 2019-07-01 MED ORDER — WARFARIN SODIUM 1 MG PO TABS
1.0000 mg | ORAL_TABLET | Freq: Once | ORAL | Status: AC
  Administered 2019-07-01: 1 mg via ORAL
  Filled 2019-07-01: qty 1

## 2019-07-01 MED ORDER — DEXTROSE 5 % IV SOLN: INTRAVENOUS | Status: DC

## 2019-07-01 NOTE — Plan of Care (Signed)

## 2019-07-01 NOTE — Progress Notes (Signed)
PROGRESS NOTE                                                                                                                                                                                                             Patient Demographics:    Noah [name], is a 79 y.o. male, DOB - October 07, 1940, ZOX:[id number]  Admit date - 06/23/2019   Admitting Physician Carin Hock, MD  Outpatient Primary MD for the patient is Sasser, Clarene Critchley, MD  LOS - 7  Chief Complaint  Patient presents with  . Shortness of Breath       Brief Narrative   Patient is a 79 y.o. male with past medical history of CAD s/p PCI in 2008, ischemic cardiomyopathy, combined chronic systolic and diastolic heart failure, s/p BiV ICD, A. fib on anticoagulation, DM, HTN, GERD, HLD, OSA-3 L nocturnally, CKD stage IIIa who presented to [facility] on 2/6 with hypothermia and hypotension.  Chest x-ray showed pulmonary edema, patient was started on Levophed infusion and transferred to [facility] course complicated by worsening somnolence, hypercarbia- required intubation on 3/8, he was transferred to my care on 07/01/2019 on day 7 of his hospital stay.   Subjective:    [facility] today has, No headache, No chest pain, No abdominal pain - No Nausea, No new weakness tingling or numbness, No Cough - SOB.      Significant events:  3/06 Admit  3/08 Intubated for hypercarbic respiratory failure 3/08 TTE-EF 35-40% 3/09 Intermittent agitation overnight 3/13 transfer to [facility]  Antimicrobial therapy: Vancomycin: 3/6>> 3/7 Cefepime: 3/6>> 3/8 Unasyn: 3/9>> 3/10  Microbiology data: 3/6 influenza/Covid: Negative 3/6 blood cultures: Negative 3/6 urine culture: Enterococcus faecalis 3/7 MRSA PCR negative  Procedures : ETT 3/8 >>3/10 R Radial ALine 3/7 >>  Consults: [facility] Cardiology on 3/13    Assessment  & Plan :     Shock/hypotension most likely due to sepsis caused by Enterococcus UTI:   Treated by critical care, has finished antibiotic treatment and sepsis pathophysiology has resolved, there was question of relative adrenal insufficiency but currently blood pressure is stable off of steroids.  His high-dose blood pressure medications at home could be contributing to his hypotension in the setting of UTI.  He currently seems to be tolerating low-dose beta-blocker well.  Sherryll Burger continues to be on hold.  Will monitor closely.  Enterococcus UTI: Has completed a course of treatment-follow fever curve and WBC.  Acute metabolic encephalopathy: Secondary to metabolic acidosis, hypercarbia-CT  head negative.  Condition has improved, BiPAP settings changed further on 07/01/2019.  No focal deficits.  Sepsis pathophysiology and UTI have resolved.  Acute on chronic hypercarbic/hypoxic respiratory failure: Most likely due to hypercarbia, mild element of CHF.  Intubated for worsening hypercarbia on 3/8-extubated on 3/10.  On BiPAP at night for OSA-currently stable on 3 L in am.  Morbid Obesity with OSA, OHS: BMI of 41, follow with PCP for weight loss, uses CPAP at home, currently on BiPAP, settings adjusted on 07/01/2019 for mild somnolence and likely some underlying retention.  Acute on chronic systolic/diastolic heart failure (EF 35-40% by TTE on 3/8) has underlying ischemic cardiomyopathy, CAD s/p PCI in 2008, has AICD: Edema improved after diuresis, blood pressure has stabilized, on low-dose beta-blocker, Entresto on hold due to severe hypotension upon admission, cardiology following, Lasix Wurth on 07/01/2019 due to hypernatremia and now somewhat of intravascular dehydration.  Chronic atrial fibrillation, [place] vas 2 score of at least 3: Telemetry with paced rhythm-rate controlled with metoprolol-Coumadin per pharmacy.  INR therapeutic.  AKI on CKD stage IIIa: AKI hemodynamically mediated-creatinine close to baseline.  Stable UA and renal ultrasound.  Hyponatremia.  Hold Lasix on 07/01/2019,  500 cc of D5W and monitor.  Dysphagia: SLP following-started on dysphagia 1 diet on 3/12  Deconditioning/debility: Secondary to acute illness-suspect some amount of frailty at baseline.  Evaluated by rehab services-plans are for SNF on discharge if patient is agreeable.  DM-2: CBGs stable-SSI  Lab Results  Component Value Date   HGBA1C 8.3 (H) 06/25/2019   CBG (last 3)  Recent Labs    06/30/19 2001 07/01/19 0324 07/01/19 0734  GLUCAP 208* 108* 94      Family Communication  :  Wife and daughter [name] on 07/01/19  Code Status :  Full  Disposition Plan  :  SNF clinically better, still getting treated for severe CHF, hypernatremia  Consults  : Cards  Procedures  :    TTE  IMPRESSIONS  1. Technically difficult; definity used; inferoseptal, inferolateral and apical hypokinesis; overall moderate LV dysfunction; mild MR.  2. Left ventricular ejection fraction, by estimation, is 35 to 40%. The left ventricle has moderately decreased function. The left ventricle demonstrates regional wall motion abnormalities (see scoring diagram/findings for description). The left ventricular internal cavity size was mildly dilated. Left ventricular diastolic parameters are indeterminate.  3. Right ventricular systolic function is normal. The right ventricular size is normal. There is moderately elevated pulmonary artery systolic pressure.  4. The mitral valve is normal in structure. Mild mitral valve regurgitation. No evidence of mitral stenosis.  5. The aortic valve is tricuspid. Aortic valve regurgitation is not visualized. Mild aortic valve sclerosis is present, with no evidence of aortic valve stenosis.  6. The inferior vena cava is dilated in size with <50% respiratory variability, suggesting right atrial pressure of 15 mmHg.   CT HEAD - non acute  Renal US - non acuite   DVT Prophylaxis  :  Coumadin  Lab Results  Component Value Date   INR 2.9 (H) 07/01/2019   INR 2.7 (H) 06/30/2019     INR 2.5 (H) 06/29/2019    Lab Results  Component Value Date   PLT 117 (L) 07/01/2019    Diet :  Diet Order            DIET - DYS 1 Room service appropriate? Yes with Assist; Fluid consistency: Nectar Thick  Diet effective now  Inpatient Medications Scheduled Meds: . Chlorhexidine Gluconate Cloth  6 each Topical Daily  . FLUoxetine  20 mg Oral QPM  . [START ON 07/02/2019] furosemide  40 mg Intravenous Daily  . insulin aspart  0-20 Units Subcutaneous Q4H  . levETIRAcetam  750 mg Oral BID  . mouth rinse  15 mL Mouth Rinse BID  . Melatonin  3 mg Oral QHS  . metoprolol tartrate  12.5 mg Oral BID  . pantoprazole  40 mg Oral Daily  . QUEtiapine  25 mg Oral QHS  . sodium chloride flush  10-40 mL Intracatheter Q12H  . tamsulosin  0.8 mg Oral QPM  . Warfarin - Pharmacist Dosing Inpatient   Does not apply q1800   Continuous Infusions: . dextrose 100 mL/hr at 07/01/19 0808   PRN Meds:.Resource ThickenUp Clear  Antibiotics  :   Anti-infectives (From admission, onward)   Start     Dose/Rate Route Frequency Ordered Stop   06/26/19 1800  vancomycin (VANCOREADY) IVPB 1500 mg/300 mL  Status:  Discontinued     1,500 mg 150 mL/hr over 120 Minutes Intravenous Every 48 hours 06/25/19 1002 06/25/19 1431   06/26/19 1000  Ampicillin-Sulbactam (UNASYN) 3 g in sodium chloride 0.9 % 100 mL IVPB  Status:  Discontinued     3 g 200 mL/hr over 30 Minutes Intravenous Every 6 hours 06/26/19 0919 06/27/19 1337   06/24/19 2200  ceFEPIme (MAXIPIME) 2 g in sodium chloride 0.9 % 100 mL IVPB  Status:  Discontinued     2 g 200 mL/hr over 30 Minutes Intravenous Every 12 hours 06/24/19 1134 06/26/19 0851   06/24/19 1800  vancomycin (VANCOREADY) IVPB 1500 mg/300 mL  Status:  Discontinued     1,500 mg 150 mL/hr over 120 Minutes Intravenous Every 24 hours 06/23/19 1759 06/25/19 1002   06/24/19 0100  ceFEPIme (MAXIPIME) 2 g in sodium chloride 0.9 % 100 mL IVPB  Status:  Discontinued     2  g 200 mL/hr over 30 Minutes Intravenous Every 8 hours 06/23/19 1759 06/24/19 1134   06/23/19 1800  vancomycin (VANCOREADY) IVPB 2000 mg/400 mL     2,000 mg 200 mL/hr over 120 Minutes Intravenous  Once 06/23/19 1759 06/23/19 2056   06/23/19 1700  ceFEPIme (MAXIPIME) 2 g in sodium chloride 0.9 % 100 mL IVPB     2 g 200 mL/hr over 30 Minutes Intravenous  Once 06/23/19 1655 06/23/19 1741   06/23/19 1700  metroNIDAZOLE (FLAGYL) IVPB 500 mg     500 mg 100 mL/hr over 60 Minutes Intravenous  Once 06/23/19 1655 06/23/19 1849   06/23/19 1700  vancomycin (VANCOCIN) IVPB 1000 mg/200 mL premix  Status:  Discontinued     1,000 mg 200 mL/hr over 60 Minutes Intravenous  Once 06/23/19 1655 06/23/19 1759          Objective:   Vitals:   07/01/19 0400 07/01/19 0402 07/01/19 0706 07/01/19 0817  BP: (!) 146/60   (!) 153/70  Pulse: 71   85  Resp: 13   18  Temp:  98.5 F (36.9 C)  97.7 F (36.5 C)  TempSrc:  Axillary  Oral  SpO2: 98%  98% 95%  Weight:      Height:        SpO2: 95 % O2 Flow Rate (L/min): 4 L/min FiO2 (%): 40 %  Wt Readings from Last 3 Encounters:  06/29/19 124.6 kg  06/01/19 122.7 kg  05/28/19 124.3 kg     Intake/Output Summary (Last 24 hours)  at 07/01/2019 1013 Last data filed at 07/01/2019 0810 Gross per 24 hour  Intake 490 ml  Output --  Net 490 ml     Physical Exam  Awake, but very minimally drowsy, No new F.N deficits, Normal affect [place].AT,PERRAL Supple Neck,No JVD, No cervical lymphadenopathy appriciated.  Symmetrical Chest wall movement, Good air movement bilaterally, CTAB RRR,No Gallops,Rubs or new Murmurs, No Parasternal Heave +ve B.Sounds, Abd Soft, No tenderness, No organomegaly appriciated, No rebound - guarding or rigidity. No Cyanosis, Clubbing, trace edema, No new Rash or bruise       Data Review:    CBC Recent Labs  Lab 06/26/19 0221 06/26/19 0640 06/27/19 0423 06/27/19 2236 06/28/19 0405 06/30/19 0607 07/01/19 0511  WBC 10.7*   10.8*  --  10.6*  --  8.6 7.7 8.1  HGB 10.2*  10.1*   < > 9.5* 9.5* 8.6* 8.1* 8.8*  HCT 33.4*  33.2*   < > 30.8* 28.0* 29.5* 28.1* 30.3*  PLT 87*  87*  --  121*  --  109* 102* 117*  MCV 90.0  90.7  --  90.6  --  93.9 96.2 95.0  MCH 27.5  27.6  --  27.9  --  27.4 27.7 27.6  MCHC 30.5  30.4  --  30.8  --  29.2* 28.8* 29.0*  RDW 14.1  14.2  --  14.3  --  14.2 13.5 13.2  LYMPHSABS 0.4*  --   --   --   --   --   --   MONOABS 0.4  --   --   --   --   --   --   EOSABS 0.0  --   --   --   --   --   --   BASOSABS 0.0  --   --   --   --   --   --    < > = values in this interval not displayed.    Chemistries  Recent Labs  Lab  0000 06/25/19 0308 06/25/19 0434 06/25/19 1322 06/25/19 1515 06/25/19 1753 06/26/19 0221 06/26/19 0640 06/27/19 0423 06/27/19 2236 06/28/19 0405 06/29/19 0239 06/30/19 0607 07/01/19 0511  NA  --  144   < >  --   --    < > 142   < > 148* 150* 151*  --  142 151*  K  --  5.1   < >  --   --    < > 4.8   < > 3.9 4.3 4.2  --  4.3 4.2  CL  --  116*  --   --   --    < > 112*  --  114*  --  114*  --  105 109  CO2  --  18*  --   --   --    < > 21*  --  25  --  27  --  29 32  GLUCOSE  --  44*  --   --   --    < > 272*  --  125*  --  123*  --  340* 105*  BUN  --  63*  --   --   --    < > 64*  --  79*  --  76*  --  55* 49*  CREATININE  --  2.03*  --   --   --    < > 1.76*  --  1.62*  --  1.40*  --  1.34* 1.21  CALCIUM  --  8.0*  --   --   --    < > 8.1*  --  8.3*  --  8.6*  --  8.7* 9.0  AST  --  21  --   --   --   --   --   --  13*  --   --   --   --  15  ALT  --  44  --   --   --   --   --   --  25  --   --   --   --  19  ALKPHOS  --  118  --   --   --   --   --   --  101  --   --   --   --  83  BILITOT  --  0.5  --   --   --   --   --   --  0.6  --   --   --   --  0.9  MG  --   --   --   --   --   --   --   --   --   --   --   --   --  1.9  INR   < > 2.4*  --   --   --   --  3.4*   < > 3.3*  --  2.6* 2.5* 2.7* 2.9*  TSH  --   --   --  3.350  --   --   --   --    --   --   --   --   --   --   HGBA1C  --   --   --   --  8.3*  --   --   --   --   --   --   --   --   --    < > = values in this interval not displayed.   Recent Labs  Lab 07/01/19 0511  BNP 1,072.6*    ------------------------------------------------------------------------------------------------------------------ No results for input(s): CHOL, HDL, LDLCALC, TRIG, CHOLHDL, LDLDIRECT in the last 72 hours.  Lab Results  Component Value Date   HGBA1C 8.3 (H) 06/25/2019   ------------------------------------------------------------------------------------------------------------------ No results for input(s): TSH, T4TOTAL, T3FREE, THYROIDAB in the last 72 hours.  Invalid input(s): FREET3 ------------------------------------------------------------------------------------------------------------------ No results for input(s): VITAMINB12, FOLATE, FERRITIN, TIBC, IRON, RETICCTPCT in the last 72 hours.  Coagulation profile Recent Labs  Lab 06/27/19 0423 06/28/19 0405 06/29/19 0239 06/30/19 0607 07/01/19 0511  INR 3.3* 2.6* 2.5* 2.7* 2.9*    No results for input(s): DDIMER in the last 72 hours.  Cardiac Enzymes No results for input(s): CKMB, TROPONINI, MYOGLOBIN in the last 168 hours.  Invalid input(s): CK ------------------------------------------------------------------------------------------------------------------    Component Value Date/Time   BNP 1,072.6 (H) 07/01/2019 1324    Micro Results Recent Results (from the past 240 hour(s))  Blood Culture (routine x 2)     Status: None   Collection Time: 06/23/19  5:28 PM   Specimen: BLOOD RIGHT FOREARM  Result Value Ref Range Status   Specimen Description BLOOD RIGHT FOREARM  Final   Special Requests   Final    BOTTLES DRAWN AEROBIC AND ANAEROBIC Blood Culture adequate volume   Culture   Final    NO GROWTH 5 DAYS Performed at [facility], [address] [place],  [place] [address]    Report Status 06/28/2019 FINAL   Final  Blood Culture (routine x 2)     Status: None   Collection Time: 06/23/19  5:33 PM   Specimen: BLOOD RIGHT HAND  Result Value Ref Range Status   Specimen Description BLOOD RIGHT HAND  Final   Special Requests   Final    BOTTLES DRAWN AEROBIC AND ANAEROBIC Blood Culture adequate volume   Culture   Final    NO GROWTH 5 DAYS Performed at [facility], [address] [place], [place] [address]    Report Status 06/28/2019 FINAL  Final  Respiratory Panel by RT PCR (Flu A&B, Covid) - Nasopharyngeal Swab     Status: None   Collection Time: 06/23/19  6:10 PM   Specimen: Nasopharyngeal Swab  Result Value Ref Range Status   SARS Coronavirus 2 by RT PCR NEGATIVE NEGATIVE Final    Comment: (NOTE) SARS-CoV-2 target nucleic acids are NOT DETECTED. The SARS-CoV-2 RNA is generally detectable in upper respiratoy specimens during the acute phase of infection. The lowest concentration of SARS-CoV-2 viral copies this assay can detect is 131 copies/mL. A negative result does not preclude SARS-Cov-2 infection and should not be used as the sole basis for treatment or other patient management decisions. A negative result may occur with  improper specimen collection/handling, submission of specimen other than nasopharyngeal swab, presence of viral mutation(s) within the areas targeted by this assay, and inadequate number of viral copies (<131 copies/mL). A negative result must be combined with clinical observations, patient history, and epidemiological information. The expected result is Negative. Fact Sheet for Patients:  PinkCheek.be Fact Sheet for Healthcare Providers:  GravelBags.it This test is not yet ap proved or cleared by the [place] FDA and  has been authorized for detection and/or diagnosis of SARS-CoV-2 by FDA under an Emergency Use Authorization (EUA). This EUA will remain  in effect (meaning this test can be used) for  the duration of the COVID-19 declaration under Section 564(b)(1) of the Act, 21 U.S.C. section 360bbb-3(b)(1), unless the authorization is terminated or revoked sooner.    Influenza A by PCR NEGATIVE NEGATIVE Final   Influenza B by PCR NEGATIVE NEGATIVE Final    Comment: (NOTE) The Xpert Xpress SARS-CoV-2/FLU/RSV assay is intended as an aid in  the diagnosis of influenza from Nasopharyngeal swab specimens and  should not be used as a sole basis for treatment. Nasal washings and  aspirates are unacceptable for Xpert Xpress SARS-CoV-2/FLU/RSV  testing. Fact Sheet for Patients: PinkCheek.be Fact Sheet for Healthcare Providers: GravelBags.it This test is not yet approved or cleared by the [place] FDA and  has been authorized for detection and/or diagnosis of SARS-CoV-2 by  FDA under an Emergency Use Authorization (EUA). This EUA will remain  in effect (meaning this test can be used) for the duration of the  Covid-19 declaration under Section 564(b)(1) of the Act, 21  U.S.C. section 360bbb-3(b)(1), unless the authorization is  terminated or revoked. Performed at [facility], [address], [place], [place] [address]   Urine culture     Status: Abnormal   Collection Time: 06/23/19  8:30 PM   Specimen: In/Out Cath Urine  Result Value Ref Range Status   Specimen Description   Final    IN/OUT CATH URINE Performed at [facility], [address] [place], [place] [address]    Special Requests   Final    NONE Performed at [facility], [address] [place], [place] [address]  Culture 5,000 COLONIES/mL ENTEROCOCCUS FAECALIS (A)  Final   Report Status 06/26/2019 FINAL  Final   Organism ID, Bacteria ENTEROCOCCUS FAECALIS (A)  Final      Susceptibility   Enterococcus faecalis - MIC*    AMPICILLIN <=2 SENSITIVE Sensitive     NITROFURANTOIN <=16 SENSITIVE Sensitive     VANCOMYCIN 1 SENSITIVE Sensitive     * 5,000  COLONIES/mL ENTEROCOCCUS FAECALIS  MRSA PCR Screening     Status: None   Collection Time: 06/24/19  4:02 AM   Specimen: Nasopharyngeal  Result Value Ref Range Status   MRSA by PCR NEGATIVE NEGATIVE Final    Comment:        The GeneXpert MRSA Assay (FDA approved for NASAL specimens only), is one component of a comprehensive MRSA colonization surveillance program. It is not intended to diagnose MRSA infection nor to guide or monitor treatment for MRSA infections. Performed at [facility], [address], [place], [place] [address]     Radiology Reports DG Abd 1 View  Result Date: 06/25/2019 CLINICAL DATA:  Respiratory failure.  OG tube placement EXAM: ABDOMEN - 1 VIEW COMPARISON:  Chest radiograph 06/25/2019 FINDINGS: NG tube extends to level of the diaphragm midline. Difficult to tell if tip of the tube is within stomach or hiatal hernia. IMPRESSION: NG tube extends in the esophagus to the level the diaphragm. Tip may be within the proximal stomach or within a hiatal hernia. Electronically Signed   By: Genevive Bi M.D.   On: 06/25/2019 07:41   CT HEAD WO CONTRAST  Result Date: 06/25/2019 CLINICAL DATA:  Encephalopathy. EXAM: CT HEAD WITHOUT CONTRAST TECHNIQUE: Contiguous axial images were obtained from the base of the skull through the vertex without intravenous contrast. COMPARISON:  April 10, 2019. FINDINGS: Brain: Mild chronic ischemic white matter disease is noted. No mass effect or midline shift is noted. Ventricular size is within normal limits. There is no evidence of mass lesion, hemorrhage or acute infarction. Vascular: No hyperdense vessel or unexpected calcification. Skull: Normal. Negative for fracture or focal lesion. Sinuses/Orbits: No acute finding. Other: None. IMPRESSION: Mild chronic ischemic white matter disease. No acute intracranial abnormality seen. Electronically Signed   By: Lupita Raider M.D.   On: 06/25/2019 08:43   US RENAL  Result Date:  06/26/2019 CLINICAL DATA:  Acute kidney injury EXAM: RENAL / URINARY TRACT ULTRASOUND COMPLETE COMPARISON:  Ultrasound 04/16/2019 FINDINGS: Right Kidney: Renal measurements: 11.7 x 6 x 5.7 cm = volume: 206.7 mL . Echogenicity within normal limits. No mass or hydronephrosis visualized. Left Kidney: Renal measurements: 11.6 x 6.6 x 4.7 cm = volume: 186.8 mL. Echogenicity within normal limits. No mass or hydronephrosis visualized. Bladder: Appears nearly empty.  Foley catheter. Other: None. IMPRESSION: Negative for hydronephrosis.  Foley decompression of the bladder. Electronically Signed   By: Jasmine Pang M.D.   On: 06/26/2019 20:20   DG Chest 1V REPEAT Same Day  Result Date: 06/23/2019 CLINICAL DATA:  Evaluate for pulmonary edema. EXAM: CHEST - 1 VIEW SAME DAY COMPARISON:  Chest radiograph 06/23/2019 at 4:19 p.m. FINDINGS: Stable cardiomediastinal contours with enlarged heart size. Left chest pacer in place. Central vascular congestion. There are diffuse bilateral heterogeneous pulmonary opacities likely moderate edema. Mild atelectatic change at the left base. No pneumothorax. Possible trace left pleural effusion. No acute finding in the visualized skeleton. IMPRESSION: Cardiomegaly with central congestion and bilateral pulmonary opacities likely moderate pulmonary edema. Possible trace left effusion. Electronically Signed   By: Harriett Sine  Pete Glatter M.D.   On: 06/23/2019 20:45   DG Chest Port 1 View  Result Date: 07/01/2019 CLINICAL DATA:  Shortness of breath EXAM: PORTABLE CHEST 1 VIEW COMPARISON:  06/28/2019 FINDINGS: Cardiomegaly with mild interstitial edema. Patchy bilateral lower lobe opacities, likely atelectasis. Small bilateral pleural effusions. Left subclavian ICD. Old right posterior rib fracture deformities. IMPRESSION: Cardiomegaly with mild interstitial edema and small bilateral pleural effusions. Patchy bilateral lower lobe opacities, likely atelectasis. Electronically Signed   By: Charline Bills M.D.   On: 07/01/2019 08:13   DG CHEST PORT 1 VIEW  Result Date: 06/28/2019 CLINICAL DATA:  79 year old male with history of shortness of breath and acute respiratory failure with hypoxia. EXAM: PORTABLE CHEST 1 VIEW COMPARISON:  Chest x-ray 06/27/2019. FINDINGS: Patient has been extubated. Previously noted nasogastric tube has been withdrawn. There is a right upper extremity PICC with tip terminating in the distal superior vena cava. Left-sided biventricular pacemaker/AICD with lead tips projecting over the expected location of the right atrium, right ventricular apex and overlying the the left ventricle via the coronary sinus and coronary veins. Low lung volumes. Patchy multifocal interstitial and airspace disease in the lungs bilaterally. Small bilateral pleural effusions. Cephalization of the pulmonary vasculature. Mild cardiomegaly. Upper mediastinal contours are within normal limits. IMPRESSION: 1. Support apparatus, as above. 2. Low lung volumes with evidence of mild congestive heart failure, as above. Electronically Signed   By: Trudie Reed M.D.   On: 06/28/2019 09:25   DG CHEST PORT 1 VIEW  Result Date: 06/27/2019 CLINICAL DATA:  Respiratory failure EXAM: PORTABLE CHEST 1 VIEW COMPARISON:  06/26/2019 FINDINGS: The endotracheal tube and NG tubes are stable. Persistent cardiac enlargement. Much improved lung aeration suggesting resolving pulmonary edema. There is a persistent small left pleural effusion and left lower lobe atelectasis. IMPRESSION: 1. Stable support apparatus. 2. Improving lung aeration with resolving pulmonary edema. 3. Persistent small left effusion and left basilar atelectasis. Electronically Signed   By: Rudie Meyer M.D.   On: 06/27/2019 09:17   DG CHEST PORT 1 VIEW  Result Date: 06/26/2019 CLINICAL DATA:  Respiratory failure. EXAM: PORTABLE CHEST 1 VIEW COMPARISON:  Chest x-ray 06/25/2019 FINDINGS: The endotracheal tube is 3.5 cm above the carina. The NG tube is  coursing down the esophagus and into the stomach. The pacer wires are stable. Persistent cardiac enlargement. Persistent diffuse interstitial process, bilateral pleural effusions and streaky bibasilar atelectasis. No focal airspace consolidation or pneumothorax. IMPRESSION: 1. Stable support apparatus. 2. Persistent CHF changes. Electronically Signed   By: Rudie Meyer M.D.   On: 06/26/2019 05:04   DG CHEST PORT 1 VIEW  Result Date: 06/25/2019 CLINICAL DATA:  Intubation EXAM: PORTABLE CHEST 1 VIEW COMPARISON:  Earlier today FINDINGS: New endotracheal tube with tip 18 mm above the carina. New enteric tube which reaches the stomach. Cardiomegaly with interstitial opacities and small pleural effusion, the former is intervally improved. No visible pneumothorax. Cardiomegaly and biventricular pacer/ICD. IMPRESSION: 1. New endotracheal tube with tip 18 mm above the carina. 2. Probable CHF.  Mildly improved aeration. Electronically Signed   By: Marnee Spring M.D.   On: 06/25/2019 07:25   DG Chest Port 1 View  Result Date: 06/25/2019 CLINICAL DATA:  79 year old male with shortness of breath. EXAM: PORTABLE CHEST 1 VIEW COMPARISON:  Portable chest 06/23/2019 and earlier. FINDINGS: Portable AP semi upright view at 0331 hours. Stable left chest cardiac AICD. Stable cardiac size and mediastinal contours. No pneumothorax. Chronic small left pleural effusion or pleural scarring appears  stable since December. Other lung markings have not significantly changed since that time. No pneumothorax or air bronchograms. Chronic right rib fractures. No acute osseous abnormality identified. Paucity of bowel gas in the upper abdomen. IMPRESSION: Ventilation not significantly changed compared to December with small left pleural effusion and nonspecific increased interstitial markings. Interstitial edema is difficult to exclude. Electronically Signed   By: Odessa Fleming M.D.   On: 06/25/2019 03:48   DG Chest Portable 1 View  Result  Date: 06/23/2019 CLINICAL DATA:  79 year old male with shortness of breath. EXAM: PORTABLE CHEST 1 VIEW COMPARISON:  Chest radiograph dated 04/12/2019. FINDINGS: There is shallow inspiration. Left lung base density, likely chronic atelectasis/scarring. Developing infiltrate is not excluded. An area of airspace density in the right suprahilar region appears similar to prior radiograph, likely chronic. Clinical correlation is recommended. No large pleural effusion. No pneumothorax. There is cardiomegaly with mild vascular congestion. Left pectoral AICD device. No acute osseous pathology. IMPRESSION: 1. Shallow inspiration with left lung base atelectasis/scarring. Developing infiltrate is less likely. Clinical correlation is recommended. 2. Stable appearing right suprahilar density, likely chronic. 3. Cardiomegaly with mild vascular congestion. Electronically Signed   By: Elgie Collard M.D.   On: 06/23/2019 16:35   DG Fluoro Rm 1-60 Min - No Report  Result Date: 06/29/2019 Fluoroscopy was utilized by the requesting physician.  No radiographic interpretation.   ECHOCARDIOGRAM COMPLETE  Result Date: 06/25/2019    ECHOCARDIOGRAM REPORT   Patient Name:   Jerardo E Gubbels Date of Exam: 06/25/2019 Medical Rec #:  [id number]      Height:       68.0 in Accession #:    [id number]     Weight:       287.7 lb Date of Birth:  11-26-1940      BSA:          2.385 m Patient Age:    78 years       BP:           123/67 mmHg Patient Gender: M              HR:           80 bpm. Exam Location:  Inpatient Procedure: 2D Echo and Intracardiac Opacification Agent Indications:    Atrial Fibrillation I48.91  History:        Patient has prior history of Echocardiogram examinations, most                 recent 05/18/2018. Ischemic Cardiomyopathy, CAD, Pacemaker; Risk                 Factors:Hypertension, Dyslipidemia and Diabetes.  Sonographer:    Thurman Coyer RDCS (AE) Referring Phys: [id number] Aliene Beams  Sonographer Comments: Echo  performed with patient supine and on artificial respirator and patient is morbidly obese. IMPRESSIONS  1. Technically difficult; definity used; inferoseptal, inferolateral and apical hypokinesis; overall moderate LV dysfunction; mild MR.  2. Left ventricular ejection fraction, by estimation, is 35 to 40%. The left ventricle has moderately decreased function. The left ventricle demonstrates regional wall motion abnormalities (see scoring diagram/findings for description). The left ventricular internal cavity size was mildly dilated. Left ventricular diastolic parameters are indeterminate.  3. Right ventricular systolic function is normal. The right ventricular size is normal. There is moderately elevated pulmonary artery systolic pressure.  4. The mitral valve is normal in structure. Mild mitral valve regurgitation. No evidence of mitral stenosis.  5. The aortic valve is tricuspid. Aortic  valve regurgitation is not visualized. Mild aortic valve sclerosis is present, with no evidence of aortic valve stenosis.  6. The inferior vena cava is dilated in size with <50% respiratory variability, suggesting right atrial pressure of 15 mmHg. FINDINGS  Left Ventricle: Left ventricular ejection fraction, by estimation, is 35 to 40%. The left ventricle has moderately decreased function. The left ventricle demonstrates regional wall motion abnormalities. Definity contrast agent was given IV to delineate the left ventricular endocardial borders. The left ventricular internal cavity size was mildly dilated. There is no left ventricular hypertrophy. Left ventricular diastolic parameters are indeterminate. Right Ventricle: The right ventricular size is normal. Right ventricular systolic function is normal. There is moderately elevated pulmonary artery systolic pressure. The tricuspid regurgitant velocity is 3.09 m/s, and with an assumed right atrial pressure of 15 mmHg, the estimated right ventricular systolic pressure is 53.2 mmHg.  Left Atrium: Left atrial size was normal in size. Right Atrium: Right atrial size was normal in size. Pericardium: There is no evidence of pericardial effusion. Mitral Valve: The mitral valve is normal in structure. Normal mobility of the mitral valve leaflets. Mild mitral annular calcification. Mild mitral valve regurgitation. No evidence of mitral valve stenosis. Tricuspid Valve: The tricuspid valve is normal in structure. Tricuspid valve regurgitation is trivial. No evidence of tricuspid stenosis. Aortic Valve: The aortic valve is tricuspid. Aortic valve regurgitation is not visualized. Mild aortic valve sclerosis is present, with no evidence of aortic valve stenosis. Pulmonic Valve: The pulmonic valve was not well visualized. Pulmonic valve regurgitation is not visualized. No evidence of pulmonic stenosis. Aorta: The aortic root is normal in size and structure. Venous: The inferior vena cava is dilated in size with less than 50% respiratory variability, suggesting right atrial pressure of 15 mmHg. IAS/Shunts: No atrial level shunt detected by color flow Doppler. Additional Comments: Technically difficult; definity used; inferoseptal, inferolateral and apical hypokinesis; overall moderate LV dysfunction; mild MR. A pacer wire is visualized.  LEFT VENTRICLE PLAX 2D LVIDd:         5.60 cm  Diastology LVIDs:         4.50 cm  LV e' lateral: 9.36 cm/s LV PW:         1.10 cm  LV e' medial:  9.14 cm/s LV IVS:        1.10 cm LVOT diam:     2.40 cm LV SV:         105 LV SV Index:   44 LVOT Area:     4.52 cm  RIGHT VENTRICLE RV S prime:     7.94 cm/s TAPSE (M-mode): 1.6 cm LEFT ATRIUM             Index       RIGHT ATRIUM           Index LA diam:        3.70 cm 1.55 cm/m  RA Area:     16.90 cm LA Vol (A2C):   51.5 ml 21.59 ml/m RA Volume:   37.00 ml  15.51 ml/m LA Vol (A4C):   50.1 ml 21.00 ml/m LA Biplane Vol: 51.6 ml 21.63 ml/m  AORTIC VALVE LVOT Vmax:   115.00 cm/s LVOT Vmean:  67.800 cm/s LVOT VTI:    0.233 m   AORTA Ao Root diam: 3.30 cm TRICUSPID VALVE TR Peak grad:   38.2 mmHg TR Vmax:        309.00 cm/s  SHUNTS Systemic VTI:  0.23 m Systemic Diam: 2.40 cm  Olga Millers MD Electronically signed by Olga Millers MD Signature Date/Time: 06/25/2019/12:22:15 PM    Final    [place] EKG SITE RITE  Result Date: 06/26/2019 If Site Rite image not attached, placement could not be confirmed due to current cardiac rhythm.   Time Spent in minutes  30   Susa Raring M.D on 07/01/2019 at 10:13 AM  To page go to [email or web address] - password [facility]

## 2019-07-01 NOTE — Progress Notes (Signed)
Rt removed V60 bipap and replaced with dreamstaion with same settings. Pt tolerating well at this time. RT will continue to monitor.

## 2019-07-01 NOTE — Progress Notes (Addendum)
DAILY PROGRESS NOTE   Patient Name: Noah [name] Date of Encounter: 07/01/2019 Cardiologist: Prentice Docker, MD  Chief Complaint   Breathing better, feels like he is "coming down with something" - sore throat, nasal congestion  Patient Profile   Noah [name] is a 79 y.o. 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 Jerral Ralph.  Subjective   No output recorded overnight - weight is 124 kg today (was 131 yesterday) - he says he peed a lot. On 40 mg  IV lasix daily. BP elevated today, however, recently he was hypotensive. Hypernatremic today at 151 (from 142), creatinine improved at 1.21 (from 1.34). BNP elevated at 1072. CXR shows small bilateral pleural effusions.  Objective   Vitals:   07/01/19 0400 07/01/19 0402 07/01/19 0706 07/01/19 0817  BP: (!) 146/60   (!) 153/70  Pulse: 71   85  Resp: 13   18  Temp:  98.5 F (36.9 C)  97.7 F (36.5 C)  TempSrc:  Axillary  Oral  SpO2: 98%  98% 95%  Weight:      Height:        Intake/Output Summary (Last 24 hours) at 07/01/2019 1029 Last data filed at 07/01/2019 0810 Gross per 24 hour  Intake 490 ml  Output --  Net 490 ml   Filed Weights   06/27/19 0409 06/28/19 0500 06/29/19 0515  Weight: 132.9 kg 131.8 kg 124.6 kg    Physical Exam   General appearance: alert, mild distress and morbidly obese Neck: JVD - 3 cm above sternal notch, no carotid bruit and thyroid not enlarged, symmetric, no tenderness/mass/nodules Lungs: diminished breath sounds bilaterally and wheezes expiratory Heart: regular rate and  rhythm Abdomen: obese, protuberant Extremities: edema 1-2+ edema Pulses: 2+ and symmetric Skin: pale, warm, dry Neurologic: Grossly normal Psych: Pleasant  Inpatient Medications    Scheduled Meds: . Chlorhexidine Gluconate Cloth  6 each Topical Daily  . FLUoxetine  20 mg Oral QPM  . [START ON 07/02/2019] furosemide  40 mg Intravenous Daily  . insulin aspart  0-20 Units Subcutaneous Q4H  . levETIRAcetam  750 mg Oral BID  . mouth rinse  15 mL Mouth Rinse BID  . Melatonin  3 mg Oral QHS  . metoprolol tartrate  12.5 mg Oral BID  . pantoprazole  40 mg Oral Daily  . QUEtiapine  25 mg Oral QHS  . sodium chloride flush  10-40 mL Intracatheter Q12H  . tamsulosin  0.8 mg Oral QPM  . Warfarin - Pharmacist Dosing Inpatient   Does not apply q1800    Continuous Infusions: . dextrose 100 mL/hr at 07/01/19 0808    PRN Meds: Resource ThickenUp Clear   Labs   Results for orders placed or performed during the hospital encounter of 06/23/19 (from the past 48 hour(s))  Glucose, capillary     Status: Abnormal   Collection Time: 06/29/19 11:36 AM  Result Value Ref Range   Glucose-Capillary 116 (H) 70 - 99 mg/dL    Comment: Glucose reference range applies only to samples taken after fasting for at least  8 hours.  Glucose, capillary     Status: Abnormal   Collection Time: 06/29/19  3:48 PM  Result Value Ref Range   Glucose-Capillary 130 (H) 70 - 99 mg/dL    Comment: Glucose reference range applies only to samples taken after fasting for at least 8 hours.  Glucose, capillary     Status: Abnormal   Collection Time: 06/29/19  8:31 PM  Result Value Ref Range   Glucose-Capillary 151 (H) 70 - 99 mg/dL    Comment: Glucose reference range applies only to samples taken after fasting for at least 8 hours.  Glucose, capillary     Status: Abnormal   Collection Time: 06/29/19 11:26 PM  Result Value Ref Range   Glucose-Capillary 146 (H) 70 - 99 mg/dL    Comment: Glucose reference range applies only to  samples taken after fasting for at least 8 hours.  Glucose, capillary     Status: Abnormal   Collection Time: 06/30/19  5:25 AM  Result Value Ref Range   Glucose-Capillary 117 (H) 70 - 99 mg/dL    Comment: Glucose reference range applies only to samples taken after fasting for at least 8 hours.   Comment 1 Notify RN   Protime-INR     Status: Abnormal   Collection Time: 06/30/19  6:07 AM  Result Value Ref Range   Prothrombin Time 28.4 (H) 11.4 - 15.2 seconds   INR 2.7 (H) 0.8 - 1.2    Comment: (NOTE) INR goal varies based on device and disease states. Performed at [facility], [address] [place], [place] [address]   CBC     Status: Abnormal   Collection Time: 06/30/19  6:07 AM  Result Value Ref Range   WBC 7.7 4.0 - 10.5 K/uL   RBC 2.92 (L) 4.22 - 5.81 MIL/uL   Hemoglobin 8.1 (L) 13.0 - 17.0 g/dL   HCT 01.7 (L) 51.0 - 25.8 %   MCV 96.2 80.0 - 100.0 fL   MCH 27.7 26.0 - 34.0 pg   MCHC 28.8 (L) 30.0 - 36.0 g/dL   RDW 52.7 78.2 - 42.3 %   Platelets 102 (L) 150 - 400 K/uL    Comment: Immature Platelet Fraction may be clinically indicated, consider ordering this additional test [id number] CONSISTENT WITH PREVIOUS RESULT    nRBC 0.0 0.0 - 0.2 %    Comment: Performed at [facility], [address] [place], [place] [address]  Basic metabolic panel     Status: Abnormal   Collection Time: 06/30/19  6:07 AM  Result Value Ref Range   Sodium 142 135 - 145 mmol/L   Potassium 4.3 3.5 - 5.1 mmol/L   Chloride 105 98 - 111 mmol/L   CO2 29 22 - 32 mmol/L   Glucose, Bld 340 (H) 70 - 99 mg/dL    Comment: Glucose reference range applies only to samples taken after fasting for at least 8 hours.   BUN 55 (H) 8 - 23 mg/dL   Creatinine, Ser 6.76 (H) 0.61 - 1.24 mg/dL   Calcium 8.7 (L) 8.9 - 10.3 mg/dL   GFR calc non Af Amer 50 (L) >60 mL/min   GFR calc Af Amer 58 (L) >60 mL/min   Anion gap 8 5 - 15    Comment: Performed at [facility], [address],  [place], [place] [address]  Glucose, capillary     Status: Abnormal   Collection Time: 06/30/19 11:15 AM  Result Value Ref  Range   Glucose-Capillary 123 (H) 70 - 99 mg/dL    Comment: Glucose reference range applies only to samples taken after fasting for at least 8 hours.   Comment 1 Notify RN    Comment 2 Document in Chart   Glucose, capillary     Status: Abnormal   Collection Time: 06/30/19  3:44 PM  Result Value Ref Range   Glucose-Capillary 152 (H) 70 - 99 mg/dL    Comment: Glucose reference range applies only to samples taken after fasting for at least 8 hours.   Comment 1 Notify RN    Comment 2 Document in Chart   Glucose, capillary     Status: Abnormal   Collection Time: 06/30/19  8:01 PM  Result Value Ref Range   Glucose-Capillary 208 (H) 70 - 99 mg/dL    Comment: Glucose reference range applies only to samples taken after fasting for at least 8 hours.  Glucose, capillary     Status: Abnormal   Collection Time: 07/01/19  3:24 AM  Result Value Ref Range   Glucose-Capillary 108 (H) 70 - 99 mg/dL    Comment: Glucose reference range applies only to samples taken after fasting for at least 8 hours.   Comment 1 Notify RN    Comment 2 Document in Chart   Protime-INR     Status: Abnormal   Collection Time: 07/01/19  5:11 AM  Result Value Ref Range   Prothrombin Time 30.6 (H) 11.4 - 15.2 seconds   INR 2.9 (H) 0.8 - 1.2    Comment: (NOTE) INR goal varies based on device and disease states. Performed at [facility], [place] [address], [place], [place] [address]   CBC     Status: Abnormal   Collection Time: 07/01/19  5:11 AM  Result Value Ref Range   WBC 8.1 4.0 - 10.5 K/uL   RBC 3.19 (L) 4.22 - 5.81 MIL/uL   Hemoglobin 8.8 (L) 13.0 - 17.0 g/dL   HCT 30.3 (L) 39.0 - 52.0 %   MCV 95.0 80.0 - 100.0 fL   MCH 27.6 26.0 - 34.0 pg   MCHC 29.0 (L) 30.0 - 36.0 g/dL   RDW 13.2 11.5 - 15.5 %   Platelets 117 (L) 150 - 400 K/uL    Comment: Immature Platelet Fraction may be clinically  indicated, consider ordering this additional test [id number] CONSISTENT WITH PREVIOUS RESULT    nRBC 0.0 0.0 - 0.2 %    Comment: Performed at [facility], [place] [address] [place], [place] [address]  Magnesium     Status: None   Collection Time: 07/01/19  5:11 AM  Result Value Ref Range   Magnesium 1.9 1.7 - 2.4 mg/dL    Comment: Performed at [place] [address] [place], [place] [address]  Comprehensive metabolic panel     Status: Abnormal   Collection Time: 07/01/19  5:11 AM  Result Value Ref Range   Sodium 151 (H) 135 - 145 mmol/L   Potassium 4.2 3.5 - 5.1 mmol/L   Chloride 109 98 - 111 mmol/L   CO2 32 22 - 32 mmol/L   Glucose, Bld 105 (H) 70 - 99 mg/dL    Comment: Glucose reference range applies only to samples taken after fasting for at least 8 hours.   BUN 49 (H) 8 - 23 mg/dL   Creatinine, Ser 1.21 0.61 - 1.24 mg/dL   Calcium 9.0 8.9 - 10.3 mg/dL   Total Protein 5.6 (L)  6.5 - 8.1 g/dL   Albumin 2.4 (L) 3.5 - 5.0 g/dL   AST 15 15 - 41 U/L   ALT 19 0 - 44 U/L   Alkaline Phosphatase 83 38 - 126 U/L   Total Bilirubin 0.9 0.3 - 1.2 mg/dL   GFR calc non Af Amer 57 (L) >60 mL/min   GFR calc Af Amer >60 >60 mL/min   Anion gap 10 5 - 15    Comment: Performed at [facility], [address] [place], [place] [address]  Brain natriuretic peptide     Status: Abnormal   Collection Time: 07/01/19  5:11 AM  Result Value Ref Range   B Natriuretic Peptide 1,072.6 (H) 0.0 - 100.0 pg/mL    Comment: Performed at [facility], [address], [place], [place] [address]  Glucose, capillary     Status: None   Collection Time: 07/01/19  7:34 AM  Result Value Ref Range   Glucose-Capillary 94 70 - 99 mg/dL    Comment: Glucose reference range applies only to samples taken after fasting for at least 8 hours.   Comment 1 Notify RN    Comment 2 Document in Chart     ECG   N/A  Telemetry    Bi-V paced rhythm - Personally Reviewed  Radiology    DG Chest  Port 1 View  Result Date: 07/01/2019 CLINICAL DATA:  Shortness of breath EXAM: PORTABLE CHEST 1 VIEW COMPARISON:  06/28/2019 FINDINGS: Cardiomegaly with mild interstitial edema. Patchy bilateral lower lobe opacities, likely atelectasis. Small bilateral pleural effusions. Left subclavian ICD. Old right posterior rib fracture deformities. IMPRESSION: Cardiomegaly with mild interstitial edema and small bilateral pleural effusions. Patchy bilateral lower lobe opacities, likely atelectasis. Electronically Signed   By: Charline Bills M.D.   On: 07/01/2019 08:13   DG Fluoro Rm 1-60 Min - No Report  Result Date: 06/29/2019 Fluoroscopy was utilized by the requesting physician.  No radiographic interpretation.    Cardiac Studies   N/A  Assessment   1. Active Problems: 2.   Permanent atrial fibrillation (HCC) 3.   OSA on CPAP 4.   Hypotension 5.   Acute pulmonary edema (HCC) 6.   Somnolence 7.   Acute respiratory failure with hypoxia (HCC) 8.   AKI (acute kidney injury) (HCC) 9.   Urinary tract infection without hematuria 10.   Plan   1. Seems like good diuresis yesterday, remains volume overloaded, but free water deficient. BP improved, sodium up possibly from aggressive diuresis. Creatinine improved. Lasix Aller today by hospital medicine - encourage free water intake and consider resuming gentle diuresis once sodium corrects. Would be cautious about giving volume back. Monitor strict I's.O's and daily weights.   Time Spent Directly with Patient:  I have spent a total of 25 minutes with the patient reviewing hospital notes, telemetry, EKGs, labs and examining the patient as well as establishing an assessment and plan that was discussed personally with the patient.  > 50% of time was spent in direct patient care.  Length of Stay:  LOS: 7 days   Chrystie Nose, MD, [facility], FACP  [place]  [facility]  Medical Director of the Advanced Lipid Disorders &  Cardiovascular Risk  Reduction Clinic Diplomate of the American Board of Clinical Lipidology Attending Cardiologist  Direct Dial: [phone]  Fax: [phone]  Website:  [email or web address] Nicely Daleen Steinhaus 07/01/2019, 10:29 AM

## 2019-07-01 NOTE — Progress Notes (Signed)
ANTICOAGULATION CONSULT NOTE  Pharmacy Consult for Coumadin Indication: atrial fibrillation  Allergies  Allergen Reactions  . Codeine Other (See Comments)    constipation  . Erythromycin-Sulfisoxazole Other (See Comments)    Causes infection in throat and eyes    Patient Measurements: Height: 5\' 8"  (172.7 cm) Weight: 274 lb 11.1 oz (124.6 kg) IBW/kg (Calculated) : 68.4  Vital Signs: Temp: 97.4 F (36.3 C) (03/14 1300) Temp Source: Oral (03/14 1300) BP: 154/109 (03/14 1303) Pulse Rate: 71 (03/14 1303)  Labs: Recent Labs    06/29/19 0239 06/30/19 0607 07/01/19 0511  HGB  --  8.1* 8.8*  HCT  --  28.1* 30.3*  PLT  --  102* 117*  LABPROT 26.8* 28.4* 30.6*  INR 2.5* 2.7* 2.9*  CREATININE  --  1.34* 1.21    Estimated Creatinine Clearance: 64.7 mL/min (by C-G formula based on SCr of 1.21 mg/dL).   Medical History: Past Medical History:  Diagnosis Date  . CAD S/P percutaneous coronary angioplasty    remote PCIs in 1990's- last CFX PCI 2008  . Chronic kidney disease, stage III (moderate)   . Diabetes mellitus with complication (HCC)    with hyperglycemia and diabetic autonomic poly neuropathy  . Gastro-esophageal reflux disease with esophagitis   . Hyperlipidemia   . Hypertension   . Ischemic cardiomyopathy 01/20/2018   St Jude ICD   . Morbid obesity (HCC)   . Obstructive sleep apnea    on CPAP  . Polyneuropathy   . Primary insomnia     Assessment: 79yo male presented to [facility] ED c/o SOB, concern for pulmonary edema, tx'd to [facility] for further evaluation, to continue Coumadin for Afib. Admission INR low at 1.2.  INR today is therapeutic at 2.9, slightly increased from 2.7. CBC stable. No overt bleeding noted.   *PTA dose = 2.5 daily per recent anti-coag visit on 2/25. H/H plts stable no s/sx bleeding noted  Goal of Therapy:  INR 2-3   Plan:  Warfarin 1 mg x 1 tonight  Daily INR, check cbc in am  3/25, PharmD PGY2 Infectious Disease Pharmacy  Resident  07/01/2019  2:26 PM  Please check [email or web address] for unit-specific pharmacy phone numbers.

## 2019-07-02 DIAGNOSIS — Z95810 Presence of automatic (implantable) cardiac defibrillator: Secondary | ICD-10-CM

## 2019-07-02 DIAGNOSIS — I5043 Acute on chronic combined systolic (congestive) and diastolic (congestive) heart failure: Secondary | ICD-10-CM

## 2019-07-02 DIAGNOSIS — I480 Paroxysmal atrial fibrillation: Secondary | ICD-10-CM

## 2019-07-02 LAB — CBC WITH DIFFERENTIAL/PLATELET
Abs Immature Granulocytes: 0.03 10*3/uL (ref 0.00–0.07)
Basophils Absolute: 0 10*3/uL (ref 0.0–0.1)
Basophils Relative: 0 %
Eosinophils Absolute: 0.5 10*3/uL (ref 0.0–0.5)
Eosinophils Relative: 5 %
HCT: 27.2 % — ABNORMAL LOW (ref 39.0–52.0)
Hemoglobin: 7.9 g/dL — ABNORMAL LOW (ref 13.0–17.0)
Immature Granulocytes: 0 %
Lymphocytes Relative: 20 %
Lymphs Abs: 1.7 10*3/uL (ref 0.7–4.0)
MCH: 27.4 pg (ref 26.0–34.0)
MCHC: 29 g/dL — ABNORMAL LOW (ref 30.0–36.0)
MCV: 94.4 fL (ref 80.0–100.0)
Monocytes Absolute: 0.7 10*3/uL (ref 0.1–1.0)
Monocytes Relative: 8 %
Neutro Abs: 5.6 10*3/uL (ref 1.7–7.7)
Neutrophils Relative %: 67 %
Platelets: 122 10*3/uL — ABNORMAL LOW (ref 150–400)
RBC: 2.88 MIL/uL — ABNORMAL LOW (ref 4.22–5.81)
RDW: 13.4 % (ref 11.5–15.5)
WBC: 8.5 10*3/uL (ref 4.0–10.5)
nRBC: 0 % (ref 0.0–0.2)

## 2019-07-02 LAB — GLUCOSE, CAPILLARY
Glucose-Capillary: 86 mg/dL (ref 70–99)
Glucose-Capillary: 102 mg/dL — ABNORMAL HIGH (ref 70–99)
Glucose-Capillary: 102 mg/dL — ABNORMAL HIGH (ref 70–99)
Glucose-Capillary: 152 mg/dL — ABNORMAL HIGH (ref 70–99)
Glucose-Capillary: 197 mg/dL — ABNORMAL HIGH (ref 70–99)
Glucose-Capillary: 142 mg/dL — ABNORMAL HIGH (ref 70–99)

## 2019-07-02 LAB — BASIC METABOLIC PANEL
Anion gap: 10 (ref 5–15)
BUN: 36 mg/dL — ABNORMAL HIGH (ref 8–23)
CO2: 32 mmol/L (ref 22–32)
Calcium: 9 mg/dL (ref 8.9–10.3)
Chloride: 109 mmol/L (ref 98–111)
Creatinine, Ser: 1.2 mg/dL (ref 0.61–1.24)
GFR calc Af Amer: >60 mL/min (ref >60)
GFR calc non Af Amer: 58 mL/min — ABNORMAL LOW (ref >60)
Glucose, Bld: 99 mg/dL (ref 70–99)
Potassium: 4.1 mmol/L (ref 3.5–5.1)
Sodium: 151 mmol/L — ABNORMAL HIGH (ref 135–145)

## 2019-07-02 LAB — PROTIME-INR
INR: 3.2 — ABNORMAL HIGH (ref 0.8–1.2)
Prothrombin Time: 33 seconds — ABNORMAL HIGH (ref 11.4–15.2)

## 2019-07-02 LAB — MAGNESIUM: Magnesium: 1.8 mg/dL (ref 1.7–2.4)

## 2019-07-02 LAB — BRAIN NATRIURETIC PEPTIDE: B Natriuretic Peptide: 1000.5 pg/mL — ABNORMAL HIGH (ref 0.0–100.0)

## 2019-07-02 MED ORDER — FUROSEMIDE 40 MG PO TABS
40.0000 mg | ORAL_TABLET | Freq: Two times a day (BID) | ORAL | Status: DC
  Administered 2019-07-02 – 2019-07-04 (×6): 40 mg via ORAL
  Filled 2019-07-02 (×6): qty 1

## 2019-07-02 NOTE — Progress Notes (Signed)
ANTICOAGULATION CONSULT NOTE  Pharmacy Consult for Coumadin Indication: atrial fibrillation  Allergies  Allergen Reactions  . Codeine Other (See Comments)    constipation  . Erythromycin-Sulfisoxazole Other (See Comments)    Causes infection in throat and eyes    Patient Measurements: Height: 5\' 8"  (172.7 cm) Weight: 274 lb 11.1 oz (124.6 kg) IBW/kg (Calculated) : 68.4  Vital Signs: Temp: 99.2 F (37.3 C) (03/15 1059) Temp Source: Axillary (03/15 1059) BP: 157/63 (03/15 1059) Pulse Rate: 67 (03/15 1059)  Labs: Recent Labs    06/30/19 0607 06/30/19 0607 07/01/19 0511 07/02/19 0436  HGB 8.1*   < > 8.8* 7.9*  HCT 28.1*  --  30.3* 27.2*  PLT 102*  --  117* 122*  LABPROT 28.4*  --  30.6* 33.0*  INR 2.7*  --  2.9* 3.2*  CREATININE 1.34*  --  1.21 1.20   < > = values in this interval not displayed.    Estimated Creatinine Clearance: 65.2 mL/min (by C-G formula based on SCr of 1.2 mg/dL).   Medical History: Past Medical History:  Diagnosis Date  . CAD S/P percutaneous coronary angioplasty    remote PCIs in 1990's- last CFX PCI 2008  . Chronic kidney disease, stage III (moderate)   . Diabetes mellitus with complication (HCC)    with hyperglycemia and diabetic autonomic poly neuropathy  . Gastro-esophageal reflux disease with esophagitis   . Hyperlipidemia   . Hypertension   . Ischemic cardiomyopathy 01/20/2018   St Jude ICD   . Morbid obesity (HCC)   . Obstructive sleep apnea    on CPAP  . Polyneuropathy   . Primary insomnia     Assessment: 79yo male presented to [facility] ED c/o SOB, concern for pulmonary edema, tx'd to [facility] for further evaluation, to continue Coumadin for Afib. Admission INR low at 1.2.  INR today is supratherapeutic at 3.2, continuing to trend up. Hgb down slightly to 7.9, plt 122. No s/sx of bleeding documented in chart. Minimal oral intake documented.  *PTA dose = 2.5 daily per recent anti-coag visit on 2/25.  Goal of Therapy:  INR 2-3    Plan:  Will hold warfarin tonight to curve INR trend Daily INR, check CBC in am  3/25, PharmD, BCCCP Clinical Pharmacist  Phone: [phone]  Please check AMION for all [facility] Pharmacy phone numbers After 10:00 PM, call Main Pharmacy [phone] 07/02/2019  11:41 AM

## 2019-07-02 NOTE — Progress Notes (Signed)
Physical Therapy Treatment Patient Details Name: Noah [name] MRN: [id number] DOB: [birth year] Today's Date: 07/02/2019    History of Present Illness 79 y/o M who presented to [facility] on 3/6 with reports of SOB, found to by hypotensive and hypothermic, treated for sepsis. Pt required intubation on 3/8 due to [place] and hypercarbia. Extubated on 3/10.    PT Comments    Pt up in bariatric recliner with nursing via lift. Performed strengthening exercises due to unable to attempt standing from bariatric recliner due to height of chair. Continue to recommend SNF.    Follow Up Recommendations  SNF;Supervision/Assistance - 24 hour     Equipment Recommendations  (defer to post-acute setting)    Recommendations for Other Services       Precautions / Restrictions Precautions Precautions: Fall Restrictions Weight Bearing Restrictions: No    Mobility  Bed Mobility                  Transfers                 General transfer comment: Pt up in bariatric recliner via lift by nursing  Ambulation/Gait                 Stairs             Wheelchair Mobility    Modified Rankin (Stroke Patients Only)       Balance Overall balance assessment: Needs assistance Sitting-balance support: Single extremity supported;Feet supported Sitting balance-Leahy Scale: Poor Sitting balance - Comments: Brought trunk forward at edge of chair with UE support                                    Cognition Arousal/Alertness: Awake/alert Behavior During Therapy: Flat affect Overall Cognitive Status: Impaired/Different from baseline Area of Impairment: Orientation;Memory;Following commands;Safety/judgement                 Orientation Level: Disoriented to;Time;Situation   Memory: Decreased recall of precautions;Decreased short-term memory Following Commands: Follows one step commands consistently;Follows one step commands with increased  time Safety/Judgement: Decreased awareness of safety;Decreased awareness of deficits            Exercises General Exercises - Lower Extremity Long Arc Quad: AAROM;Both;10 reps;Seated Heel Slides: AAROM;Both;10 reps Hip ABduction/ADduction: AAROM;Both;10 reps;Seated Other Exercises Other Exercises: Pt pulled trunk forward from back of chair using hand holds. 5 x 2 reps    General Comments        Pertinent Vitals/Pain Pain Assessment: No/denies pain    Home Living                      Prior Function            PT Goals (current goals can now be found in the care plan section) Progress towards PT goals: Not progressing toward goals - comment    Frequency    Min 2X/week      PT Plan Current plan remains appropriate    Co-evaluation              AM-PAC PT "6 Clicks" Mobility   Outcome Measure  Help needed turning from your back to your side while in a flat bed without using bedrails?: A Lot Help needed moving from lying on your back to sitting on the side of a flat bed without using bedrails?: A Lot Help needed moving to and from  a bed to a chair (including a wheelchair)?: A Lot Help needed standing up from a chair using your arms (e.g., wheelchair or bedside chair)?: A Lot Help needed to walk in hospital room?: A Lot Help needed climbing 3-5 steps with a railing? : Total 6 Click Score: 11    End of Session Equipment Utilized During Treatment: Oxygen Activity Tolerance: Patient tolerated treatment well Patient left: in chair;with call bell/phone within reach   PT Visit Diagnosis: Muscle weakness (generalized) (M62.81)     Time: 8614-8307 PT Time Calculation (min) (ACUTE ONLY): 12 min  Charges:                        [facility] PT Acute Rehabilitation Services Pager [phone] Office [phone]    Angelina Ok [facility] 07/02/2019, 4:21 PM

## 2019-07-02 NOTE — Progress Notes (Signed)
  Speech Language Pathology Treatment: Dysphagia  Patient Details Name: Noah [name] MRN: [id number] DOB: 13-Feb-1941 Today's Date: 07/02/2019 Time: 3295-1884 SLP Time Calculation (min) (ACUTE ONLY): 16 min  Assessment / Plan / Recommendation Clinical Impression  Pt was seen for dysphagia treatment and was cooperative throughout the session. Pt and nursing reported that the pt has not been eating today since he does not like the puree solids or the thickened liquids. He exhibited coughing with the initial bolus of thin liquids via cup but tolerated puree solids, regular texture solids and 5/6 sets of consecutive swallows of thin liquids via straw without overt s/sx of aspiration. Mastication time was within functional limits and no significant oral residue was noted. Pt's diet will be upgraded to regular texture solids and thin liquids at this time and SLP will follow pt to ensure tolerance of the upgraded diet.    HPI HPI: Pt is a 79 yr old with PMHx CAD s/p PCI in 2008, ischemic cardiomyopathy, combined systolic and diastolic dysfunction, s/p BiV ICD, A fib, DM, HTN, GERD, HLD, OSA, and CKD Stage 3 who presented to [facility] ED on 3/6 with hypothermia and hypotension. Received 2.5 L IVF subsequent CXR showed pulmonary edema, started on Levophed gtt and transferred to [facility]. Chest x-ray 3/11: Low lung volumes with evidence of mild congestive heart failure. ETT 3/8-3/10      SLP Plan  Goals updated       Recommendations  Diet recommendations: Regular;Thin liquid Liquids provided via: Straw;Cup Medication Administration: Whole meds with puree Supervision: Full supervision/cueing for compensatory strategies Compensations: Slow rate;Small sips/bites;Follow solids with liquid Postural Changes and/or Swallow Maneuvers: Seated upright 90 degrees;Upright 30-60 min after meal                Oral Care Recommendations: Oral care BID Follow up Recommendations: Skilled Nursing  facility SLP Visit Diagnosis: Dysphagia, pharyngeal phase (R13.13) Plan: Goals updated       Noah [name] I. Vear Clock, MS, CCC-SLP Acute Rehabilitation Services Office number [phone] Pager [phone]                Noah [name] 07/02/2019, 6:25 PM

## 2019-07-02 NOTE — Progress Notes (Signed)
Called speech therapy, patient is on speech therapy schedule today for re-evaluation----

## 2019-07-02 NOTE — Progress Notes (Signed)
Nutrition Follow up  DOCUMENTATION CODES:   Obesity unspecified  INTERVENTION:    Magic cup TID with meals, each supplement provides 290 kcal and 9 grams of protein  Hormel Shake BID between meals, each supplement provides 520 kcals and 22 grams of protein  MVI daily   NUTRITION DIAGNOSIS:   Increased nutrient needs related to acute illness as evidenced by estimated needs   Ongoing  GOAL:   Patient will meet greater than or equal to 90% of their needs   Not meeting   MONITOR:   Weight trends, Labs, I & O's, TF tolerance, Vent status, Diet advancement  REASON FOR ASSESSMENT:   Consult Enteral/tube feeding initiation and management  ASSESSMENT:   Patient with PMH significant for CAD s/p PCI, ischemic cardiomyopathy, CHF, s/p BiV ICD, DM, HTN, GERD, HLD, and CKD III. Presents this admission with hypertension from unclear etiology.   3/10- extubated  3/12- diet advanced DYS 1 nectar thick liquids   Pt does not like current texture of diet. Meal completions charted as 0-10% since advancement. Plan to work with SLP today. Remains hypernatremic- D5 given yesterday. RD to provide supplements to maximize kcal and protein.   Admission weight: 129.2 kg  Current weight: 124.6 kg   I/O: +2,248 ml since admit  UOP: 500 ml x 24 hrs   Medications: 40 mg lasix BID, SS novolog  Labs: Na 151 (H) CBG 86-196  Diet Order:   Diet Order            DIET - DYS 1 Room service appropriate? Yes with Assist; Fluid consistency: Nectar Thick  Diet effective now              EDUCATION NEEDS:   Not appropriate for education at this time  Skin:  Skin Assessment: Skin Integrity Issues: Skin Integrity Issues:: Other (Comment) Other: MASD- abdomen  Last BM:  3/15  Height:   Ht Readings from Last 1 Encounters:  06/23/19 5\' 8"  (1.727 m)    Weight:   Wt Readings from Last 1 Encounters:  06/29/19 124.6 kg    BMI:  Body mass index is 41.77 kg/m.  Estimated Nutritional  Needs:   Kcal:  2100-2300 kcal  Protein:  105-120 grams  Fluid:  >/= 2.1 L/day   08/29/19 RD, LDN Clinical Nutrition Pager listed in AMION

## 2019-07-02 NOTE — Plan of Care (Signed)
  Problem: Health Behavior/Discharge Planning: Goal: Ability to manage health-related needs will improve Outcome: Progressing   Problem: Clinical Measurements: Goal: Ability to maintain clinical measurements within normal limits will improve Outcome: Progressing Goal: Will remain free from infection Outcome: Progressing Goal: Respiratory complications will improve Outcome: Progressing Goal: Cardiovascular complication will be avoided Outcome: Progressing   

## 2019-07-02 NOTE — TOC Initial Note (Signed)
Transition of Care [facility]) - Initial/Assessment Note    Patient Details  Name: Noah [name] MRN: [id number] Date of Birth: 1940-06-05  Transition of [facility]) CM/SW Contact:    Alberteen Sam, LCSW Phone Number: 07/02/2019, 11:18 AM  Clinical Narrative:                  CSW met with patient regarding SNF rec, patient appears not fully oriented, reports from home with wife but agreeable to SNF.   CSW lvm with patient's spouse. CSW spoke with patient's daughter [name] regarding dc planning. She confirmed patient is from home with spouse, states that he was at [facility] for 3 weeks in January before discharging home.   [name] reports her and patient's spouse agreeable for patient to go to SNF with preferences of [facility] or [facility] has faxed out referrals, pending bed offers at this time.   Expected Discharge Plan: Skilled Nursing Facility Barriers to Discharge: Continued Medical Work up   Patient Goals and CMS Choice   CMS Medicare.gov Compare Post Acute Care list provided to:: Patient Represenative (must comment)(daughter [name]) Choice offered to / list presented to : Adult Children  Expected Discharge Plan and Services Expected Discharge Plan: [place] Acute Care Choice: [place] arrangements for the past 2 months: Single Family Home                                      Prior Living Arrangements/Services Living arrangements for the past 2 months: Single Family Home Lives with:: Spouse Patient language and need for interpreter reviewed:: Yes Do you feel safe going back to the place where you live?: No   needs short term rehab  Need for Family Participation in Patient Care: Yes (Comment) Care giver support system in place?: Yes (comment)   Criminal Activity/Legal Involvement Pertinent to Current Situation/Hospitalization: No - Comment as needed  Activities of Daily Living       Permission Sought/Granted Permission sought to share information with : Case Manager, Customer service manager, Family Supports Permission granted to share information with : Yes, Verbal Permission Granted  Share Information with NAME: [name]  Permission granted to share info w AGENCY: SNFs  Permission granted to share info w Relationship: daughter  Permission granted to share info w Contact Information: [phone]  Emotional Assessment Appearance:: Appears stated age Attitude/Demeanor/Rapport: Gracious Affect (typically observed): Calm Orientation: : Oriented to Self Alcohol / Substance Use: Not Applicable Psych Involvement: No (comment)  Admission diagnosis:  Pulmonary edema [J81.1] SOB (shortness of breath) [R06.02] Hypotension [I95.9] Hypothermia, initial encounter [T68.XXXA] Hypotension, unspecified hypotension type [I95.9] Patient Active Problem List   Diagnosis Date Noted  . Hypernatremia   . Urinary tract infection without hematuria   . Acute respiratory failure with hypoxia ([place])   . AKI (acute kidney injury) ([place])   . Somnolence   . Hypotension 06/24/2019  . Acute pulmonary edema ([facility])   . Essential hypertension, benign 06/01/2019  . Epilepsy ([place]) 04/11/2019  . Acute encephalopathy 04/10/2019  . Acute on chronic combined systolic and diastolic congestive heart failure ([place]) 03/29/2019  . Acute exacerbation of CHF (congestive heart failure) ([place]) 03/28/2019  . Fall at home, initial encounter 05/18/2018  . CKD (chronic kidney disease) stage 3, GFR 30-59 ml/min 05/18/2018  . OSA on CPAP 05/18/2018  . Permanent atrial fibrillation ([place])  02/21/2018  . 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 CAD S/P percutaneous coronary angioplasty 01/21/2018  . Chronic systolic (congestive) heart failure ([place]) 01/20/2018  . Type 2 diabetes mellitus with stage 3a chronic kidney disease, with long-term current use of insulin ([place]) 06/08/2012  .  Mixed hyperlipidemia 01/16/2009  . OVERWEIGHT/OBESITY 01/16/2009  . SOB (shortness of breath) 01/16/2009   PCP:  Manon Hilding, MD Pharmacy:   [id number] [place] [place]Phone: [phone]: [phone] [facility] [place] [id number]: [phone]: [phone]    Social Determinants of Health (SDOH) Interventions    Readmission Risk Interventions Readmission Risk Prevention Plan 04/25/2019 04/17/2019  Transportation Screening Complete Complete  PCP or Specialist Appt within 3-5 Days - Not Complete  HRI or HEmery- Complete  Social Work Consult for RMarlinPlanning/Counseling - Complete  Palliative Care Screening - Not Complete  Medication Review (Press photographer Complete Complete  PCP or Specialist appointment within 3-5 days of discharge Not Complete -  PCP/Specialist Appt Not Complete comments patient discharging to SNF -  [place] Home Care Consult Not Complete -  SW Recovery Care/Counseling Consult Complete -  Palliative Care Screening Not Applicable -  SStroudComplete -  Some recent data might be hidden

## 2019-07-02 NOTE — Progress Notes (Signed)
Patient pulled off C-pap from his face and sats dropped to 85%. RT called and they replaced the mask back on the patient and he removed it again as soon as they left the room. O2 applied at 4/L [place] and his sats improved to 98%. Patient then stating he was short of breath and needed to go to [facility]. Reoriented patient many times through the night. V/S stable and patient remains wide awake. Will continue to monitor closely.

## 2019-07-02 NOTE — Progress Notes (Signed)
PROGRESS NOTE                                                                                                                                                                                                             Patient Demographics:    Noah [name], is a 79 y.o. male, DOB - [birth year], XBL:[id number]  Admit date - 06/23/2019   Admitting Physician Carin Hock, MD  Outpatient Primary MD for the patient is Sasser, Noah Critchley, MD  LOS - 8  Chief Complaint  Patient presents with   Shortness of Breath       Brief Narrative   Patient is a 79 y.o. male with past medical history of CAD s/p PCI in 2008, ischemic cardiomyopathy, combined chronic systolic and diastolic heart failure, s/p BiV ICD, A. fib on anticoagulation, DM, HTN, GERD, HLD, OSA-3 L nocturnally, CKD stage IIIa who presented to [facility] on 2/6 with hypothermia and hypotension.  Chest x-ray showed pulmonary edema, patient was started on Levophed infusion and transferred to [facility] course complicated by worsening somnolence, hypercarbia- required intubation on 3/8, he was transferred to my care on 07/01/2019 on day 7 of his hospital stay.   Significant events:  3/06 Admit  3/08 Intubated for hypercarbic respiratory failure 3/08 TTE-EF 35-40% 3/09 Intermittent agitation overnight 3/13 transfer to [facility]  Antimicrobial therapy: Vancomycin: 3/6>> 3/7 Cefepime: 3/6>> 3/8 Unasyn: 3/9>> 3/10  Microbiology data: 3/6 influenza/Covid: Negative 3/6 blood cultures: Negative 3/6 urine culture: Enterococcus faecalis 3/7 MRSA PCR negative  Procedures : ETT 3/8 >>3/10 R Radial ALine 3/7 >>  Consults: [facility] Cardiology on 3/13    Subjective:   Patient in bed, appears comfortable, denies any headache, no fever, no chest pain or pressure, no shortness of breath , no abdominal pain. No focal weakness.    Assessment  & Plan :     Shock/hypotension most likely due to sepsis caused by  Enterococcus UTI:  Treated by critical care, has finished antibiotic treatment and sepsis pathophysiology has resolved, there was question of relative adrenal insufficiency but currently blood pressure is stable off of steroids.  His high-dose blood pressure medications at home could be contributing to his hypotension in the setting of UTI.  He currently seems to be tolerating low-dose beta-blocker well.  Sherryll Burger continues to be on hold.  Will monitor closely.  Enterococcus UTI: Has completed a course of treatment-follow fever curve and WBC.  Acute metabolic encephalopathy: Secondary to metabolic acidosis, hypercarbia-CT head negative.  Condition has improved, BiPAP settings changed further on 07/01/2019.  No focal deficits.  Sepsis pathophysiology and UTI have resolved.  Acute on chronic hypercarbic/hypoxic respiratory failure: Most likely due to hypercarbia, mild element of CHF.  Intubated for worsening hypercarbia on 3/8-extubated on 3/10.  On BiPAP at night for OSA-currently stable on 3 L in am.  Morbid Obesity with OSA, OHS: BMI of 41, follow with PCP for weight loss, uses CPAP at home, continue nighttime BiPAP or CPAP.  As tolerated.  Acute on chronic systolic/diastolic heart failure (EF 35-40% by TTE on 3/8) has underlying ischemic cardiomyopathy, CAD s/p PCI in 2008, has AICD: Edema improved after diuresis, blood pressure has stabilized, on low-dose beta-blocker, Entresto on hold due to severe hypotension upon admission, cardiology following, low-dose Lasix continued.  Is developing some intravascular dehydration and hypernatremia hence encouraged to take ice chips as free water intake is still not allowed due to underlying dysphagia.  Chronic atrial fibrillation, [place] vas 2 score of at least 3: Telemetry with paced rhythm-rate controlled with metoprolol-Coumadin per pharmacy.  INR therapeutic.  AKI on CKD stage IIIa: AKI hemodynamically mediated-creatinine close to baseline.  Stable UA  and renal ultrasound.  Hypernatremia.  Lasix Minich on 07/01/2019, encouraged to take ice chips orally as thin liquids still not allowed from speech therapy standpoint, gentle D5W was given on 07/01/2019.  Will monitor.  Dysphagia: SLP following-started on dysphagia 1 diet on 3/12  Deconditioning/debility: Secondary to acute illness-suspect some amount of frailty at baseline.  Evaluated by rehab services-plans are for SNF on discharge if patient is agreeable.  DM-2: CBGs stable-SSI  Lab Results  Component Value Date   HGBA1C 8.3 (H) 06/25/2019   CBG (last 3)  Recent Labs    07/01/19 2355 07/02/19 0434 07/02/19 0840  GLUCAP 103* 86 102*      Family Communication  :  Wife and daughter [name] on 07/01/19  Code Status :  Full  Disposition Plan  :  SNF clinically better, still getting treated for  CHF, hypernatremia, needs close monitoring of BMP and fluid status.  Consults  : Cards  Procedures  :    TTE  IMPRESSIONS  1. Technically difficult; definity used; inferoseptal, inferolateral and apical hypokinesis; overall moderate LV dysfunction; mild MR.  2. Left ventricular ejection fraction, by estimation, is 35 to 40%. The left ventricle has moderately decreased function. The left ventricle demonstrates regional wall motion abnormalities (see scoring diagram/findings for description). The left ventricular internal cavity size was mildly dilated. Left ventricular diastolic parameters are indeterminate.  3. Right ventricular systolic function is normal. The right ventricular size is normal. There is moderately elevated pulmonary artery systolic pressure.  4. The mitral valve is normal in structure. Mild mitral valve regurgitation. No evidence of mitral stenosis.  5. The aortic valve is tricuspid. Aortic valve regurgitation is not visualized. Mild aortic valve sclerosis is present, with no evidence of aortic valve stenosis.  6. The inferior vena cava is dilated in size with <50% respiratory  variability, suggesting right atrial pressure of 15 mmHg.   CT HEAD - non acute  Renal US - non acuite   DVT Prophylaxis  :  Coumadin  Lab Results  Component Value Date   INR 3.2 (H) 07/02/2019   INR 2.9 (H) 07/01/2019   INR 2.7 (H) 06/30/2019    Lab Results  Component Value Date   PLT 122 (L) 07/02/2019    Diet :  Diet Order  DIET - DYS 1 Room service appropriate? Yes with Assist; Fluid consistency: Nectar Thick  Diet effective now               Inpatient Medications Scheduled Meds:  Chlorhexidine Gluconate Cloth  6 each Topical Daily   FLUoxetine  20 mg Oral QPM   furosemide  40 mg Oral BID   insulin aspart  0-20 Units Subcutaneous Q4H   levETIRAcetam  750 mg Oral BID   mouth rinse  15 mL Mouth Rinse BID   Melatonin  3 mg Oral QHS   metoprolol tartrate  12.5 mg Oral BID   pantoprazole  40 mg Oral Daily   QUEtiapine  25 mg Oral QHS   sodium chloride flush  10-40 mL Intracatheter Q12H   tamsulosin  0.8 mg Oral QPM   Warfarin - Pharmacist Dosing Inpatient   Does not apply q1800   Continuous Infusions:  PRN Meds:.Resource ThickenUp Clear  Antibiotics  :   Anti-infectives (From admission, onward)   Start     Dose/Rate Route Frequency Ordered Stop   06/26/19 1800  vancomycin (VANCOREADY) IVPB 1500 mg/300 mL  Status:  Discontinued     1,500 mg 150 mL/hr over 120 Minutes Intravenous Every 48 hours 06/25/19 1002 06/25/19 1431   06/26/19 1000  Ampicillin-Sulbactam (UNASYN) 3 g in sodium chloride 0.9 % 100 mL IVPB  Status:  Discontinued     3 g 200 mL/hr over 30 Minutes Intravenous Every 6 hours 06/26/19 0919 06/27/19 1337   06/24/19 2200  ceFEPIme (MAXIPIME) 2 g in sodium chloride 0.9 % 100 mL IVPB  Status:  Discontinued     2 g 200 mL/hr over 30 Minutes Intravenous Every 12 hours 06/24/19 1134 06/26/19 0851   06/24/19 1800  vancomycin (VANCOREADY) IVPB 1500 mg/300 mL  Status:  Discontinued     1,500 mg 150 mL/hr over 120 Minutes  Intravenous Every 24 hours 06/23/19 1759 06/25/19 1002   06/24/19 0100  ceFEPIme (MAXIPIME) 2 g in sodium chloride 0.9 % 100 mL IVPB  Status:  Discontinued     2 g 200 mL/hr over 30 Minutes Intravenous Every 8 hours 06/23/19 1759 06/24/19 1134   06/23/19 1800  vancomycin (VANCOREADY) IVPB 2000 mg/400 mL     2,000 mg 200 mL/hr over 120 Minutes Intravenous  Once 06/23/19 1759 06/23/19 2056   06/23/19 1700  ceFEPIme (MAXIPIME) 2 g in sodium chloride 0.9 % 100 mL IVPB     2 g 200 mL/hr over 30 Minutes Intravenous  Once 06/23/19 1655 06/23/19 1741   06/23/19 1700  metroNIDAZOLE (FLAGYL) IVPB 500 mg     500 mg 100 mL/hr over 60 Minutes Intravenous  Once 06/23/19 1655 06/23/19 1849   06/23/19 1700  vancomycin (VANCOCIN) IVPB 1000 mg/200 mL premix  Status:  Discontinued     1,000 mg 200 mL/hr over 60 Minutes Intravenous  Once 06/23/19 1655 06/23/19 1759          Objective:   Vitals:   07/01/19 2228 07/02/19 0001 07/02/19 0400 07/02/19 0723  BP: (!) 168/75 125/64 (!) 156/61 (!) 149/64  Pulse: 66 63 65 79  Resp: 16 20 15 19   Temp:  (!) 97.4 F (36.3 C) 97.8 F (36.6 C) 98.3 F (36.8 C)  TempSrc:  Oral Axillary Axillary  SpO2: 94% (!) 86% (!) 84% 93%  Weight:      Height:        SpO2: 93 % O2 Flow Rate (L/min): 4 L/min FiO2 (%): 40 %  Wt Readings from Last 3 Encounters:  06/29/19 124.6 kg  06/01/19 122.7 kg  05/28/19 124.3 kg     Intake/Output Summary (Last 24 hours) at 07/02/2019 1028 Last data filed at 07/01/2019 2000 Gross per 24 hour  Intake 583.67 ml  Output 500 ml  Net 83.67 ml     Physical Exam  Awake Alert, No new F.N deficits, Normal affect [place].AT,PERRAL Supple Neck,No JVD, No cervical lymphadenopathy appriciated.  Symmetrical Chest wall movement, Good air movement bilaterally, CTAB RRR,No Gallops, Rubs or new Murmurs, No Parasternal Heave +ve B.Sounds, Abd Soft, No tenderness, No organomegaly appriciated, No rebound - guarding or rigidity. No Cyanosis,  Clubbing, trace edema, No new Rash or bruise     Data Review:    CBC Recent Labs  Lab 06/26/19 0221 06/26/19 0640 06/27/19 0423 06/27/19 0423 06/27/19 2236 06/28/19 0405 06/30/19 1610 07/01/19 0511 07/02/19 0436  WBC 10.7*   10.8*   < > 10.6*  --   --  8.6 7.7 8.1 8.5  HGB 10.2*   10.1*   < > 9.5*   < > 9.5* 8.6* 8.1* 8.8* 7.9*  HCT 33.4*   33.2*   < > 30.8*   < > 28.0* 29.5* 28.1* 30.3* 27.2*  PLT 87*   87*   < > 121*  --   --  109* 102* 117* 122*  MCV 90.0   90.7   < > 90.6  --   --  93.9 96.2 95.0 94.4  MCH 27.5   27.6   < > 27.9  --   --  27.4 27.7 27.6 27.4  MCHC 30.5   30.4   < > 30.8  --   --  29.2* 28.8* 29.0* 29.0*  RDW 14.1   14.2   < > 14.3  --   --  14.2 13.5 13.2 13.4  LYMPHSABS 0.4*  --   --   --   --   --   --   --  1.7  MONOABS 0.4  --   --   --   --   --   --   --  0.7  EOSABS 0.0  --   --   --   --   --   --   --  0.5  BASOSABS 0.0  --   --   --   --   --   --   --  0.0   < > = values in this interval not displayed.    Chemistries  Recent Labs  Lab 06/25/19 1322 06/25/19 1515 06/25/19 1753 06/27/19 0423 06/27/19 0423 06/27/19 2236 06/28/19 0405 06/29/19 0239 06/30/19 9604 07/01/19 0511 07/02/19 0436  NA  --   --    < > 148*   < > 150* 151*  --  142 151* 151*  K  --   --    < > 3.9   < > 4.3 4.2  --  4.3 4.2 4.1  CL  --   --    < > 114*  --   --  114*  --  105 109 109  CO2  --   --    < > 25  --   --  27  --  29 32 32  GLUCOSE  --   --    < > 125*  --   --  123*  --  340* 105* 99  BUN  --   --    < >  79*  --   --  76*  --  55* 49* 36*  CREATININE  --   --    < > 1.62*  --   --  1.40*  --  1.34* 1.21 1.20  CALCIUM  --   --    < > 8.3*  --   --  8.6*  --  8.7* 9.0 9.0  AST  --   --   --  13*  --   --   --   --   --  15  --   ALT  --   --   --  25  --   --   --   --   --  19  --   ALKPHOS  --   --   --  101  --   --   --   --   --  83  --   BILITOT  --   --   --  0.6  --   --   --   --   --  0.9  --   MG  --   --   --   --   --   --   --   --    --  1.9 1.8  INR  --   --    < > 3.3*   < >  --  2.6* 2.5* 2.7* 2.9* 3.2*  TSH 3.350  --   --   --   --   --   --   --   --   --   --   HGBA1C  --  8.3*  --   --   --   --   --   --   --   --   --    < > = values in this interval not displayed.   Recent Labs  Lab 07/01/19 0511 07/02/19 0436  BNP 1,072.6* 1,000.5*    ------------------------------------------------------------------------------------------------------------------ No results for input(s): CHOL, HDL, LDLCALC, TRIG, CHOLHDL, LDLDIRECT in the last 72 hours.  Lab Results  Component Value Date   HGBA1C 8.3 (H) 06/25/2019   ------------------------------------------------------------------------------------------------------------------ No results for input(s): TSH, T4TOTAL, T3FREE, THYROIDAB in the last 72 hours.  Invalid input(s): FREET3 ------------------------------------------------------------------------------------------------------------------ No results for input(s): VITAMINB12, FOLATE, FERRITIN, TIBC, IRON, RETICCTPCT in the last 72 hours.  Coagulation profile Recent Labs  Lab 06/28/19 0405 06/29/19 0239 06/30/19 0607 07/01/19 0511 07/02/19 0436  INR 2.6* 2.5* 2.7* 2.9* 3.2*    No results for input(s): DDIMER in the last 72 hours.  Cardiac Enzymes No results for input(s): CKMB, TROPONINI, MYOGLOBIN in the last 168 hours.  Invalid input(s): CK ------------------------------------------------------------------------------------------------------------------    Component Value Date/Time   BNP 1,000.5 (H) 07/02/2019 0436    Micro Results Recent Results (from the past 240 hour(s))  Blood Culture (routine x 2)     Status: None   Collection Time: 06/23/19  5:28 PM   Specimen: BLOOD RIGHT FOREARM  Result Value Ref Range Status   Specimen Description BLOOD RIGHT FOREARM  Final   Special Requests   Final    BOTTLES DRAWN AEROBIC AND ANAEROBIC Blood Culture adequate volume   Culture   Final    NO  GROWTH 5 DAYS Performed at [facility], [address], [place], [place] [address]    Report Status 06/28/2019 FINAL  Final  Blood Culture (routine x 2)     Status: None   Collection Time: 06/23/19  5:33 PM   Specimen: BLOOD RIGHT HAND  Result Value Ref Range Status   Specimen Description BLOOD RIGHT HAND  Final   Special Requests   Final    BOTTLES DRAWN AEROBIC AND ANAEROBIC Blood Culture adequate volume   Culture   Final    NO GROWTH 5 DAYS Performed at [facility], [address] [place], [place] [address]    Report Status 06/28/2019 FINAL  Final  Respiratory Panel by RT PCR (Flu A&B, Covid) - Nasopharyngeal Swab     Status: None   Collection Time: 06/23/19  6:10 PM   Specimen: Nasopharyngeal Swab  Result Value Ref Range Status   SARS Coronavirus 2 by RT PCR NEGATIVE NEGATIVE Final    Comment: (NOTE) SARS-CoV-2 target nucleic acids are NOT DETECTED. The SARS-CoV-2 RNA is generally detectable in upper respiratoy specimens during the acute phase of infection. The lowest concentration of SARS-CoV-2 viral copies this assay can detect is 131 copies/mL. A negative result does not preclude SARS-Cov-2 infection and should not be used as the sole basis for treatment or other patient management decisions. A negative result may occur with  improper specimen collection/handling, submission of specimen other than nasopharyngeal swab, presence of viral mutation(s) within the areas targeted by this assay, and inadequate number of viral copies (<131 copies/mL). A negative result must be combined with clinical observations, patient history, and epidemiological information. The expected result is Negative. Fact Sheet for Patients:  https://www.moore.com/ Fact Sheet for Healthcare Providers:  https://www.young.biz/ This test is not yet ap proved or cleared by the [place] FDA and  has been authorized for detection and/or diagnosis of SARS-CoV-2  by FDA under an Emergency Use Authorization (EUA). This EUA will remain  in effect (meaning this test can be used) for the duration of the COVID-19 declaration under Section 564(b)(1) of the Act, 21 U.S.C. section 360bbb-3(b)(1), unless the authorization is terminated or revoked sooner.    Influenza A by PCR NEGATIVE NEGATIVE Final   Influenza B by PCR NEGATIVE NEGATIVE Final    Comment: (NOTE) The Xpert Xpress SARS-CoV-2/FLU/RSV assay is intended as an aid in  the diagnosis of influenza from Nasopharyngeal swab specimens and  should not be used as a sole basis for treatment. Nasal washings and  aspirates are unacceptable for Xpert Xpress SARS-CoV-2/FLU/RSV  testing. Fact Sheet for Patients: https://www.moore.com/ Fact Sheet for Healthcare Providers: https://www.young.biz/ This test is not yet approved or cleared by the [place] FDA and  has been authorized for detection and/or diagnosis of SARS-CoV-2 by  FDA under an Emergency Use Authorization (EUA). This EUA will remain  in effect (meaning this test can be used) for the duration of the  Covid-19 declaration under Section 564(b)(1) of the Act, 21  U.S.C. section 360bbb-3(b)(1), unless the authorization is  terminated or revoked. Performed at [facility], [address], [place], [place] [address]   Urine culture     Status: Abnormal   Collection Time: 06/23/19  8:30 PM   Specimen: In/Out Cath Urine  Result Value Ref Range Status   Specimen Description   Final    IN/OUT CATH URINE Performed at [facility], [address] [place], [place] [address]    Special Requests   Final    NONE Performed at [facility], [address], [place], [place] [address]    Culture 5,000 COLONIES/mL ENTEROCOCCUS FAECALIS (A)  Final   Report Status 06/26/2019 FINAL  Final   Organism ID, Bacteria ENTEROCOCCUS FAECALIS (A)  Final  Susceptibility   Enterococcus faecalis - MIC*    AMPICILLIN <=2  SENSITIVE Sensitive     NITROFURANTOIN <=16 SENSITIVE Sensitive     VANCOMYCIN 1 SENSITIVE Sensitive     * 5,000 COLONIES/mL ENTEROCOCCUS FAECALIS  MRSA PCR Screening     Status: None   Collection Time: 06/24/19  4:02 AM   Specimen: Nasopharyngeal  Result Value Ref Range Status   MRSA by PCR NEGATIVE NEGATIVE Final    Comment:        The GeneXpert MRSA Assay (FDA approved for NASAL specimens only), is one component of a comprehensive MRSA colonization surveillance program. It is not intended to diagnose MRSA infection nor to guide or monitor treatment for MRSA infections. Performed at [facility], [address], [place], [place] [address]     Radiology Reports DG Abd 1 View  Result Date: 06/25/2019 CLINICAL DATA:  Respiratory failure.  OG tube placement EXAM: ABDOMEN - 1 VIEW COMPARISON:  Chest radiograph 06/25/2019 FINDINGS: NG tube extends to level of the diaphragm midline. Difficult to tell if tip of the tube is within stomach or hiatal hernia. IMPRESSION: NG tube extends in the esophagus to the level the diaphragm. Tip may be within the proximal stomach or within a hiatal hernia. Electronically Signed   By: Genevive Bi M.D.   On: 06/25/2019 07:41   CT HEAD WO CONTRAST  Result Date: 06/25/2019 CLINICAL DATA:  Encephalopathy. EXAM: CT HEAD WITHOUT CONTRAST TECHNIQUE: Contiguous axial images were obtained from the base of the skull through the vertex without intravenous contrast. COMPARISON:  April 10, 2019. FINDINGS: Brain: Mild chronic ischemic white matter disease is noted. No mass effect or midline shift is noted. Ventricular size is within normal limits. There is no evidence of mass lesion, hemorrhage or acute infarction. Vascular: No hyperdense vessel or unexpected calcification. Skull: Normal. Negative for fracture or focal lesion. Sinuses/Orbits: No acute finding. Other: None. IMPRESSION: Mild chronic ischemic white matter disease. No acute intracranial  abnormality seen. Electronically Signed   By: Lupita Raider M.D.   On: 06/25/2019 08:43   US RENAL  Result Date: 06/26/2019 CLINICAL DATA:  Acute kidney injury EXAM: RENAL / URINARY TRACT ULTRASOUND COMPLETE COMPARISON:  Ultrasound 04/16/2019 FINDINGS: Right Kidney: Renal measurements: 11.7 x 6 x 5.7 cm = volume: 206.7 mL . Echogenicity within normal limits. No mass or hydronephrosis visualized. Left Kidney: Renal measurements: 11.6 x 6.6 x 4.7 cm = volume: 186.8 mL. Echogenicity within normal limits. No mass or hydronephrosis visualized. Bladder: Appears nearly empty.  Foley catheter. Other: None. IMPRESSION: Negative for hydronephrosis.  Foley decompression of the bladder. Electronically Signed   By: Jasmine Pang M.D.   On: 06/26/2019 20:20   DG Chest 1V REPEAT Same Day  Result Date: 06/23/2019 CLINICAL DATA:  Evaluate for pulmonary edema. EXAM: CHEST - 1 VIEW SAME DAY COMPARISON:  Chest radiograph 06/23/2019 at 4:19 p.m. FINDINGS: Stable cardiomediastinal contours with enlarged heart size. Left chest pacer in place. Central vascular congestion. There are diffuse bilateral heterogeneous pulmonary opacities likely moderate edema. Mild atelectatic change at the left base. No pneumothorax. Possible trace left pleural effusion. No acute finding in the visualized skeleton. IMPRESSION: Cardiomegaly with central congestion and bilateral pulmonary opacities likely moderate pulmonary edema. Possible trace left effusion. Electronically Signed   By: Emmaline Kluver M.D.   On: 06/23/2019 20:45   DG Chest Port 1 View  Result Date: 07/01/2019 CLINICAL DATA:  Shortness of breath EXAM: PORTABLE CHEST 1 VIEW COMPARISON:  06/28/2019 FINDINGS: Cardiomegaly with mild interstitial edema. Patchy bilateral lower lobe opacities, likely atelectasis. Small bilateral pleural effusions. Left subclavian ICD. Old right posterior rib fracture deformities. IMPRESSION: Cardiomegaly with mild interstitial edema and small bilateral  pleural effusions. Patchy bilateral lower lobe opacities, likely atelectasis. Electronically Signed   By: Charline Bills M.D.   On: 07/01/2019 08:13   DG CHEST PORT 1 VIEW  Result Date: 06/28/2019 CLINICAL DATA:  79 year old male with history of shortness of breath and acute respiratory failure with hypoxia. EXAM: PORTABLE CHEST 1 VIEW COMPARISON:  Chest x-ray 06/27/2019. FINDINGS: Patient has been extubated. Previously noted nasogastric tube has been withdrawn. There is a right upper extremity PICC with tip terminating in the distal superior vena cava. Left-sided biventricular pacemaker/AICD with lead tips projecting over the expected location of the right atrium, right ventricular apex and overlying the the left ventricle via the coronary sinus and coronary veins. Low lung volumes. Patchy multifocal interstitial and airspace disease in the lungs bilaterally. Small bilateral pleural effusions. Cephalization of the pulmonary vasculature. Mild cardiomegaly. Upper mediastinal contours are within normal limits. IMPRESSION: 1. Support apparatus, as above. 2. Low lung volumes with evidence of mild congestive heart failure, as above. Electronically Signed   By: Trudie Reed M.D.   On: 06/28/2019 09:25   DG CHEST PORT 1 VIEW  Result Date: 06/27/2019 CLINICAL DATA:  Respiratory failure EXAM: PORTABLE CHEST 1 VIEW COMPARISON:  06/26/2019 FINDINGS: The endotracheal tube and NG tubes are stable. Persistent cardiac enlargement. Much improved lung aeration suggesting resolving pulmonary edema. There is a persistent small left pleural effusion and left lower lobe atelectasis. IMPRESSION: 1. Stable support apparatus. 2. Improving lung aeration with resolving pulmonary edema. 3. Persistent small left effusion and left basilar atelectasis. Electronically Signed   By: Rudie Meyer M.D.   On: 06/27/2019 09:17   DG CHEST PORT 1 VIEW  Result Date: 06/26/2019 CLINICAL DATA:  Respiratory failure. EXAM: PORTABLE CHEST 1  VIEW COMPARISON:  Chest x-ray 06/25/2019 FINDINGS: The endotracheal tube is 3.5 cm above the carina. The NG tube is coursing down the esophagus and into the stomach. The pacer wires are stable. Persistent cardiac enlargement. Persistent diffuse interstitial process, bilateral pleural effusions and streaky bibasilar atelectasis. No focal airspace consolidation or pneumothorax. IMPRESSION: 1. Stable support apparatus. 2. Persistent CHF changes. Electronically Signed   By: Rudie Meyer M.D.   On: 06/26/2019 05:04   DG CHEST PORT 1 VIEW  Result Date: 06/25/2019 CLINICAL DATA:  Intubation EXAM: PORTABLE CHEST 1 VIEW COMPARISON:  Earlier today FINDINGS: New endotracheal tube with tip 18 mm above the carina. New enteric tube which reaches the stomach. Cardiomegaly with interstitial opacities and small pleural effusion, the former is intervally improved. No visible pneumothorax. Cardiomegaly and biventricular pacer/ICD. IMPRESSION: 1. New endotracheal tube with tip 18 mm above the carina. 2. Probable CHF.  Mildly improved aeration. Electronically Signed   By: Marnee Spring M.D.   On: 06/25/2019 07:25   DG Chest Port 1 View  Result Date: 06/25/2019 CLINICAL DATA:  79 year old male with shortness of breath. EXAM: PORTABLE CHEST 1 VIEW COMPARISON:  Portable chest 06/23/2019 and earlier. FINDINGS: Portable AP semi upright view at 0331 hours. Stable left chest cardiac AICD. Stable cardiac size and mediastinal contours. No pneumothorax. Chronic small left pleural effusion or pleural scarring appears stable since December. Other lung markings have not significantly changed since that time. No pneumothorax or air bronchograms. Chronic right rib fractures. No acute osseous abnormality identified. Paucity of bowel gas  in the upper abdomen. IMPRESSION: Ventilation not significantly changed compared to December with small left pleural effusion and nonspecific increased interstitial markings. Interstitial edema is difficult to  exclude. Electronically Signed   By: Odessa Fleming M.D.   On: 06/25/2019 03:48   DG Chest Portable 1 View  Result Date: 06/23/2019 CLINICAL DATA:  79 year old male with shortness of breath. EXAM: PORTABLE CHEST 1 VIEW COMPARISON:  Chest radiograph dated 04/12/2019. FINDINGS: There is shallow inspiration. Left lung base density, likely chronic atelectasis/scarring. Developing infiltrate is not excluded. An area of airspace density in the right suprahilar region appears similar to prior radiograph, likely chronic. Clinical correlation is recommended. No large pleural effusion. No pneumothorax. There is cardiomegaly with mild vascular congestion. Left pectoral AICD device. No acute osseous pathology. IMPRESSION: 1. Shallow inspiration with left lung base atelectasis/scarring. Developing infiltrate is less likely. Clinical correlation is recommended. 2. Stable appearing right suprahilar density, likely chronic. 3. Cardiomegaly with mild vascular congestion. Electronically Signed   By: Elgie Collard M.D.   On: 06/23/2019 16:35   DG Fluoro Rm 1-60 Min - No Report  Result Date: 06/29/2019 Fluoroscopy was utilized by the requesting physician.  No radiographic interpretation.   ECHOCARDIOGRAM COMPLETE  Result Date: 06/25/2019    ECHOCARDIOGRAM REPORT   Patient Name:   Curtiss E Krisher Date of Exam: 06/25/2019 Medical Rec #:  [id number]      Height:       68.0 in Accession #:    [id number]     Weight:       287.7 lb Date of Birth:  1941/04/06      BSA:          2.385 m Patient Age:    78 years       BP:           123/67 mmHg Patient Gender: M              HR:           80 bpm. Exam Location:  Inpatient Procedure: 2D Echo and Intracardiac Opacification Agent Indications:    Atrial Fibrillation I48.91  History:        Patient has prior history of Echocardiogram examinations, most                 recent 05/18/2018. Ischemic Cardiomyopathy, CAD, Pacemaker; Risk                 Factors:Hypertension, Dyslipidemia and Diabetes.   Sonographer:    Thurman Coyer RDCS (AE) Referring Phys: [id number] Aliene Beams  Sonographer Comments: Echo performed with patient supine and on artificial respirator and patient is morbidly obese. IMPRESSIONS  1. Technically difficult; definity used; inferoseptal, inferolateral and apical hypokinesis; overall moderate LV dysfunction; mild MR.  2. Left ventricular ejection fraction, by estimation, is 35 to 40%. The left ventricle has moderately decreased function. The left ventricle demonstrates regional wall motion abnormalities (see scoring diagram/findings for description). The left ventricular internal cavity size was mildly dilated. Left ventricular diastolic parameters are indeterminate.  3. Right ventricular systolic function is normal. The right ventricular size is normal. There is moderately elevated pulmonary artery systolic pressure.  4. The mitral valve is normal in structure. Mild mitral valve regurgitation. No evidence of mitral stenosis.  5. The aortic valve is tricuspid. Aortic valve regurgitation is not visualized. Mild aortic valve sclerosis is present, with no evidence of aortic valve stenosis.  6. The inferior vena cava is dilated in size with <50% respiratory  variability, suggesting right atrial pressure of 15 mmHg. FINDINGS  Left Ventricle: Left ventricular ejection fraction, by estimation, is 35 to 40%. The left ventricle has moderately decreased function. The left ventricle demonstrates regional wall motion abnormalities. Definity contrast agent was given IV to delineate the left ventricular endocardial borders. The left ventricular internal cavity size was mildly dilated. There is no left ventricular hypertrophy. Left ventricular diastolic parameters are indeterminate. Right Ventricle: The right ventricular size is normal. Right ventricular systolic function is normal. There is moderately elevated pulmonary artery systolic pressure. The tricuspid regurgitant velocity is 3.09 m/s, and  with an assumed right atrial pressure of 15 mmHg, the estimated right ventricular systolic pressure is 53.2 mmHg. Left Atrium: Left atrial size was normal in size. Right Atrium: Right atrial size was normal in size. Pericardium: There is no evidence of pericardial effusion. Mitral Valve: The mitral valve is normal in structure. Normal mobility of the mitral valve leaflets. Mild mitral annular calcification. Mild mitral valve regurgitation. No evidence of mitral valve stenosis. Tricuspid Valve: The tricuspid valve is normal in structure. Tricuspid valve regurgitation is trivial. No evidence of tricuspid stenosis. Aortic Valve: The aortic valve is tricuspid. Aortic valve regurgitation is not visualized. Mild aortic valve sclerosis is present, with no evidence of aortic valve stenosis. Pulmonic Valve: The pulmonic valve was not well visualized. Pulmonic valve regurgitation is not visualized. No evidence of pulmonic stenosis. Aorta: The aortic root is normal in size and structure. Venous: The inferior vena cava is dilated in size with less than 50% respiratory variability, suggesting right atrial pressure of 15 mmHg. IAS/Shunts: No atrial level shunt detected by color flow Doppler. Additional Comments: Technically difficult; definity used; inferoseptal, inferolateral and apical hypokinesis; overall moderate LV dysfunction; mild MR. A pacer wire is visualized.  LEFT VENTRICLE PLAX 2D LVIDd:         5.60 cm  Diastology LVIDs:         4.50 cm  LV e' lateral: 9.36 cm/s LV PW:         1.10 cm  LV e' medial:  9.14 cm/s LV IVS:        1.10 cm LVOT diam:     2.40 cm LV SV:         105 LV SV Index:   44 LVOT Area:     4.52 cm  RIGHT VENTRICLE RV S prime:     7.94 cm/s TAPSE (M-mode): 1.6 cm LEFT ATRIUM             Index       RIGHT ATRIUM           Index LA diam:        3.70 cm 1.55 cm/m  RA Area:     16.90 cm LA Vol (A2C):   51.5 ml 21.59 ml/m RA Volume:   37.00 ml  15.51 ml/m LA Vol (A4C):   50.1 ml 21.00 ml/m LA Biplane  Vol: 51.6 ml 21.63 ml/m  AORTIC VALVE LVOT Vmax:   115.00 cm/s LVOT Vmean:  67.800 cm/s LVOT VTI:    0.233 m  AORTA Ao Root diam: 3.30 cm TRICUSPID VALVE TR Peak grad:   38.2 mmHg TR Vmax:        309.00 cm/s  SHUNTS Systemic VTI:  0.23 m Systemic Diam: 2.40 cm Olga Millers MD Electronically signed by Olga Millers MD Signature Date/Time: 06/25/2019/12:22:15 PM    Final    [place] EKG SITE RITE  Result Date: 06/26/2019 If Site Rite  image not attached, placement could not be confirmed due to current cardiac rhythm.   Time Spent in minutes  30   Susa Raring M.D on 07/02/2019 at 10:28 AM  To page go to [email or web address] - password [facility]

## 2019-07-02 NOTE — Progress Notes (Signed)
duplicate

## 2019-07-02 NOTE — Care Management Important Message (Signed)
Important Message  Patient Details  Name: Noah [name] MRN: [id number] Date of Birth: 24-Mar-1941   Medicare Important Message Given:  Yes     Renie Ora 07/02/2019, 11:36 AM

## 2019-07-02 NOTE — Plan of Care (Signed)

## 2019-07-02 NOTE — Progress Notes (Signed)
Progress Note  Patient Name: Noah [name] Date of Encounter: 07/02/2019  Primary Cardiologist: Kate Sable, MD   Subjective   Breathing comfortably.  Has CPAP on. Weight is back to what was reported as his baseline weight after aggressive diuresis. He is thirsty, but does not like to drink thickened fluids.  Sodium remains elevated at 151. In atrial fibrillation this morning, he is not aware of it. There is virtually complete loss of CRT when in atrial fibrillation.  Inpatient Medications    Scheduled Meds: . Chlorhexidine Gluconate Cloth  6 each Topical Daily  . FLUoxetine  20 mg Oral QPM  . furosemide  40 mg Oral BID  . insulin aspart  0-20 Units Subcutaneous Q4H  . levETIRAcetam  750 mg Oral BID  . mouth rinse  15 mL Mouth Rinse BID  . Melatonin  3 mg Oral QHS  . metoprolol tartrate  12.5 mg Oral BID  . pantoprazole  40 mg Oral Daily  . QUEtiapine  25 mg Oral QHS  . sodium chloride flush  10-40 mL Intracatheter Q12H  . tamsulosin  0.8 mg Oral QPM  . Warfarin - Pharmacist Dosing Inpatient   Does not apply q1800   Continuous Infusions:  PRN Meds: Resource ThickenUp Clear   Vital Signs    Vitals:   07/01/19 2228 07/02/19 0001 07/02/19 0400 07/02/19 0723  BP: (!) 168/75 125/64 (!) 156/61 (!) 149/64  Pulse: 66 63 65 79  Resp: 16 20 15 19   Temp:  (!) 97.4 F (36.3 C) 97.8 F (36.6 C) 98.3 F (36.8 C)  TempSrc:  Oral Axillary Axillary  SpO2: 94% (!) 86% (!) 84% 93%  Weight:      Height:        Intake/Output Summary (Last 24 hours) at 07/02/2019 0914 Last data filed at 07/01/2019 2000 Gross per 24 hour  Intake 583.67 ml  Output 500 ml  Net 83.67 ml   Last 3 Weights 06/29/2019 06/28/2019 06/27/2019  Weight (lbs) 274 lb 11.1 oz 290 lb 9.1 oz 292 lb 15.9 oz  Weight (kg) 124.6 kg 131.8 kg 132.9 kg      Telemetry    Atrial fibrillation with mostly controlled ventricular rate, but with loss of CRT- Personally Reviewed  ECG    Atrial flutter,  ventricular pacing- Personally Reviewed  Physical Exam  Obese, lying completely supine in bed without respiratory difficulty, on BiPAP GEN: No acute distress.   Neck: No JVD Cardiac:  Irregular, no murmurs, rubs, or gallops.  Respiratory: Clear to auscultation bilaterally. GI: Soft, nontender, non-distended  MS: No edema; No deformity. Neuro:  Nonfocal  Psych: Normal affect   Labs    High Sensitivity Troponin:   Recent Labs  Lab 06/23/19 1903 06/23/19 2125  TROPONINIHS 6 6      Chemistry Recent Labs  Lab 06/27/19 0423 06/27/19 2236 06/30/19 0607 07/01/19 0511 07/02/19 0436  NA 148*   < > 142 151* 151*  K 3.9   < > 4.3 4.2 4.1  CL 114*   < > 105 109 109  CO2 25   < > 29 32 32  GLUCOSE 125*   < > 340* 105* 99  BUN 79*   < > 55* 49* 36*  CREATININE 1.62*   < > 1.34* 1.21 1.20  CALCIUM 8.3*   < > 8.7* 9.0 9.0  PROT 5.4*  --   --  5.6*  --   ALBUMIN 2.2*  --   --  2.4*  --  AST 13*  --   --  15  --   ALT 25  --   --  19  --   ALKPHOS 101  --   --  83  --   BILITOT 0.6  --   --  0.9  --   GFRNONAA 40*   < > 50* 57* 58*  GFRAA 46*   < > 58* >60 >60  ANIONGAP 9   < > 8 10 10    < > = values in this interval not displayed.     Hematology Recent Labs  Lab 06/30/19 0607 07/01/19 0511 07/02/19 0436  WBC 7.7 8.1 8.5  RBC 2.92* 3.19* 2.88*  HGB 8.1* 8.8* 7.9*  HCT 28.1* 30.3* 27.2*  MCV 96.2 95.0 94.4  MCH 27.7 27.6 27.4  MCHC 28.8* 29.0* 29.0*  RDW 13.5 13.2 13.4  PLT 102* 117* 122*    BNP Recent Labs  Lab 07/01/19 0511 07/02/19 0436  BNP 1,072.6* 1,000.5*     DDimer No results for input(s): DDIMER in the last 168 hours.   Radiology    DG Chest Port 1 View  Result Date: 07/01/2019 CLINICAL DATA:  Shortness of breath EXAM: PORTABLE CHEST 1 VIEW COMPARISON:  06/28/2019 FINDINGS: Cardiomegaly with mild interstitial edema. Patchy bilateral lower lobe opacities, likely atelectasis. Small bilateral pleural effusions. Left subclavian ICD. Old right  posterior rib fracture deformities. IMPRESSION: Cardiomegaly with mild interstitial edema and small bilateral pleural effusions. Patchy bilateral lower lobe opacities, likely atelectasis. Electronically Signed   By: 08/28/2019 M.D.   On: 07/01/2019 08:13    Cardiac Studies   Echo 06/25/2019 1. Technically difficult; definity used; inferoseptal, inferolateral and  apical hypokinesis; overall moderate LV dysfunction; mild MR.  2. Left ventricular ejection fraction, by estimation, is 35 to 40%. The  left ventricle has moderately decreased function. The left ventricle  demonstrates regional wall motion abnormalities (see scoring  diagram/findings for description). The left  ventricular internal cavity size was mildly dilated. Left ventricular  diastolic parameters are indeterminate.  3. Right ventricular systolic function is normal. The right ventricular  size is normal. There is moderately elevated pulmonary artery systolic  pressure.  4. The mitral valve is normal in structure. Mild mitral valve  regurgitation. No evidence of mitral stenosis.  5. The aortic valve is tricuspid. Aortic valve regurgitation is not  visualized. Mild aortic valve sclerosis is present, with no evidence of  aortic valve stenosis.  6. The inferior vena cava is dilated in size with <50% respiratory  variability, suggesting right atrial pressure of 15 mmHg.   Patient Profile     79 y.o. male with a hx of CAD (s/p prior PCI to LAD and RCA in 1990's with most recent being DES to LCx in 2008, low-risk NST in 09/2017), chronic combined systolic and diastolic CHF/Ischemic Cardiomyopathy (EF 40-45% by echo in 09/2017, 35-40% on current admission), s/p BiV ICD placement, paroxysmal atrial fibrillationon chronic anticoagulation with Coumadin,CKD stage III,HTN, HLD and OSA, admitted on 06/24/2019 with severe hypotension and hypothermia, possible sepsis, developing pulmonary edema following volume  resuscitation.  Assessment & Plan    1. CHF: Limited physical exam due to morbid obesity, but his weight is back to admission weight and what is considered to be his baseline.  Able to lie supine without breathing difficulty.  Switched to oral diuretics.  Try to maintain current volume status. 2. AFib: In and out of atrial arrhythmia, which makes for worsening CRT and may be contributing to  heart failure.  Last device check January 28 shows roughly 9% stable burden of atrial arrhythmia and only 92% biventricular pacing.  On warfarin. 3. CRT-D: We will check his device on this admission.  4. CAD: No complaints of angina pectoris. 5.  Hypernatremia.  He has significant free water deficiency, but does not like to drink fluids since he is on aspiration precautions.  Would like to avoid giving him D5W due to diabetes. 6. OSA      For questions or updates, please contact [facility] Please consult [email or web address] for contact info under        Signed, Thurmon Fair, MD  07/02/2019, 9:14 AM

## 2019-07-02 NOTE — NC FL2 (Signed)
[place] LEVEL OF CARE SCREENING TOOL     IDENTIFICATION  Patient Name: Noah [name] Birthdate: December 31, 1940 Sex: male Admission Date (Current Location): 06/23/2019  [facility] and [place] Number:  Herbalist and Address:  [facility], [place] [address], [place], [place] [address]      Provider Number: [id number]  Attending Physician Name and Address:  Thurnell Lose, MD  Relative Name and Phone Number:  [name] [phone]    Current Level of Care: SNF Recommended Level of Care: [place] Prior Approval Number:    Date Approved/Denied:   PASRR Number: [id number]  Discharge Plan: SNF    Current Diagnoses: Patient Active Problem List   Diagnosis Date Noted  . Hypernatremia   . Urinary tract infection without hematuria   . Acute respiratory failure with hypoxia ([place])   . AKI (acute kidney injury) ([place])   . Somnolence   . Hypotension 06/24/2019  . Acute pulmonary edema (H[facility])   . Essential hypertension, benign 06/01/2019  . Epilepsy ([place]) 04/11/2019  . Acute encephalopathy 04/10/2019  . Acute on chronic combined systolic and diastolic congestive heart failure ([place]) 03/29/2019  . Acute exacerbation of CHF (congestive heart failure) ([place]) 03/28/2019  . Fall at home, initial encounter 05/18/2018  . CKD (chronic kidney disease) stage 3, GFR 30-59 ml/min 05/18/2018  . OSA on CPAP 05/18/2018  . Permanent atrial fibrillation ([place]) 02/21/2018  . 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 CAD S/P percutaneous coronary angioplasty 01/21/2018  . Chronic systolic (congestive) heart failure ([place]) 01/20/2018  . Type 2 diabetes mellitus with stage 3a chronic kidney disease, with long-term current use of insulin ([place]) 06/08/2012  . Mixed hyperlipidemia 01/16/2009  . OVERWEIGHT/OBESITY 01/16/2009  . SOB (shortness of breath) 01/16/2009    Orientation RESPIRATION BLADDER Height & Weight     Self  O2(Nasal Canula 4 Liters) Incontinent Weight: 274 lb 11.1 oz (124.6 kg) Height:  5\' 8"  (172.7 cm)  BEHAVIORAL SYMPTOMS/MOOD NEUROLOGICAL BOWEL NUTRITION STATUS      Incontinent Diet(See Discharge Summary)  AMBULATORY STATUS COMMUNICATION OF NEEDS Skin   Extensive Assist Verbally Other (Comment)(Echymosis on Legs and Arms MASD on Abdomen and Perineum, Skin tear on knee,)                       Personal Care Assistance Level of Assistance  Total care, Dressing, Feeding, Bathing Bathing Assistance: Maximum assistance Feeding assistance: Maximum assistance Dressing Assistance: Maximum assistance Total Care Assistance: Maximum assistance   Functional Limitations Info  Sight, Hearing, Speech Sight Info: Adequate Hearing Info: Adequate Speech Info: Adequate    SPECIAL CARE FACTORS FREQUENCY  PT (By licensed PT), OT (By licensed OT)     PT Frequency: 5x min weekly OT Frequency: 5x min weekly            Contractures Contractures Info: Not present    Additional Factors Info  Code Status, Allergies Code Status Info: FULL Allergies Info: Erythromycin-sulfisoxazole,Codeine           Current Medications (07/02/2019):  This is the current hospital active medication list Current Facility-Administered Medications  Medication Dose Route Frequency Provider Last Rate Last Admin  . Chlorhexidine Gluconate Cloth 2 % PADS 6 each  6 each Topical Daily Scatliffe, Rise Paganini, MD   6 each at 07/02/19 1000  . FLUoxetine (PROZAC) capsule 20 mg  20 mg Oral QPM Ollis, Brandi L, NP   20 mg at 07/01/19 1629  .  furosemide (LASIX) tablet 40 mg  40 mg Oral BID Leroy Sea, MD   40 mg at 07/02/19 0912  . insulin aspart (novoLOG) injection 0-20 Units  0-20 Units Subcutaneous Q4H Canary Brim L, NP   3 Units at 07/01/19 2006  . levETIRAcetam (KEPPRA) tablet 750 mg  750 mg Oral BID Icard, Bradley L, DO   750 mg at 07/02/19 0913  . MEDLINE mouth rinse  15 mL Mouth Rinse BID Icard, Bradley L, DO    15 mL at 07/02/19 1142  . Melatonin TABS 3 mg  3 mg Oral QHS Icard, Bradley L, DO   3 mg at 07/01/19 2156  . metoprolol tartrate (LOPRESSOR) tablet 12.5 mg  12.5 mg Oral BID Maretta Bees, MD   12.5 mg at 07/02/19 0913  . pantoprazole (PROTONIX) EC tablet 40 mg  40 mg Oral Daily Leroy Sea, MD   40 mg at 07/02/19 0912  . QUEtiapine (SEROQUEL) tablet 25 mg  25 mg Oral QHS Karl Ito, MD   25 mg at 07/01/19 2154  . Resource ThickenUp Clear   Oral PRN Ollis, Brandi L, NP      . sodium chloride flush (NS) 0.9 % injection 10-40 mL  10-40 mL Intracatheter Q12H Coralyn Helling, MD   10 mL at 07/02/19 0900  . tamsulosin (FLOMAX) capsule 0.8 mg  0.8 mg Oral QPM Ollis, Brandi L, NP   0.8 mg at 07/01/19 1629  . Warfarin - Pharmacist Dosing Inpatient   Does not apply q1800 Juliette Mangle, [facility]   Given at 07/01/19 1636     Discharge Medications: Please see discharge summary for a list of discharge medications.  Relevant Imaging Results:  Relevant Lab Results:   Additional Information SSN- [id number]  Terrial Rhodes, LCSWA

## 2019-07-03 DIAGNOSIS — I484 Atypical atrial flutter: Secondary | ICD-10-CM

## 2019-07-03 DIAGNOSIS — I480 Paroxysmal atrial fibrillation: Secondary | ICD-10-CM

## 2019-07-03 DIAGNOSIS — Z95810 Presence of automatic (implantable) cardiac defibrillator: Secondary | ICD-10-CM

## 2019-07-03 LAB — BASIC METABOLIC PANEL
Anion gap: 9 (ref 5–15)
BUN: 32 mg/dL — ABNORMAL HIGH (ref 8–23)
CO2: 33 mmol/L — ABNORMAL HIGH (ref 22–32)
Calcium: 9 mg/dL (ref 8.9–10.3)
Chloride: 106 mmol/L (ref 98–111)
Creatinine, Ser: 1.21 mg/dL (ref 0.61–1.24)
GFR calc Af Amer: >60 mL/min (ref >60)
GFR calc non Af Amer: 57 mL/min — ABNORMAL LOW (ref >60)
Glucose, Bld: 108 mg/dL — ABNORMAL HIGH (ref 70–99)
Potassium: 3.5 mmol/L (ref 3.5–5.1)
Sodium: 148 mmol/L — ABNORMAL HIGH (ref 135–145)

## 2019-07-03 LAB — CBC WITH DIFFERENTIAL/PLATELET
Abs Immature Granulocytes: 0.03 10*3/uL (ref 0.00–0.07)
Basophils Absolute: 0.1 10*3/uL (ref 0.0–0.1)
Basophils Relative: 1 %
Eosinophils Absolute: 0.4 10*3/uL (ref 0.0–0.5)
Eosinophils Relative: 4 %
HCT: 29.3 % — ABNORMAL LOW (ref 39.0–52.0)
Hemoglobin: 8.9 g/dL — ABNORMAL LOW (ref 13.0–17.0)
Immature Granulocytes: 0 %
Lymphocytes Relative: 19 %
Lymphs Abs: 1.7 10*3/uL (ref 0.7–4.0)
MCH: 28.3 pg (ref 26.0–34.0)
MCHC: 30.4 g/dL (ref 30.0–36.0)
MCV: 93 fL (ref 80.0–100.0)
Monocytes Absolute: 0.6 10*3/uL (ref 0.1–1.0)
Monocytes Relative: 7 %
Neutro Abs: 5.9 10*3/uL (ref 1.7–7.7)
Neutrophils Relative %: 69 %
Platelets: 117 10*3/uL — ABNORMAL LOW (ref 150–400)
RBC: 3.15 MIL/uL — ABNORMAL LOW (ref 4.22–5.81)
RDW: 13.3 % (ref 11.5–15.5)
WBC: 8.7 10*3/uL (ref 4.0–10.5)
nRBC: 0 % (ref 0.0–0.2)

## 2019-07-03 LAB — GLUCOSE, CAPILLARY
Glucose-Capillary: 113 mg/dL — ABNORMAL HIGH (ref 70–99)
Glucose-Capillary: 161 mg/dL — ABNORMAL HIGH (ref 70–99)
Glucose-Capillary: 153 mg/dL — ABNORMAL HIGH (ref 70–99)
Glucose-Capillary: 241 mg/dL — ABNORMAL HIGH (ref 70–99)
Glucose-Capillary: 198 mg/dL — ABNORMAL HIGH (ref 70–99)

## 2019-07-03 LAB — BRAIN NATRIURETIC PEPTIDE: B Natriuretic Peptide: 824.4 pg/mL — ABNORMAL HIGH (ref 0.0–100.0)

## 2019-07-03 LAB — PROTIME-INR
INR: 3.3 — ABNORMAL HIGH (ref 0.8–1.2)
Prothrombin Time: 33.2 seconds — ABNORMAL HIGH (ref 11.4–15.2)

## 2019-07-03 LAB — MAGNESIUM: Magnesium: 1.5 mg/dL — ABNORMAL LOW (ref 1.7–2.4)

## 2019-07-03 MED ORDER — METOPROLOL TARTRATE 25 MG PO TABS
25.0000 mg | ORAL_TABLET | Freq: Two times a day (BID) | ORAL | Status: DC
  Administered 2019-07-03 – 2019-07-06 (×7): 25 mg via ORAL
  Filled 2019-07-03 (×7): qty 1

## 2019-07-03 MED ORDER — POTASSIUM CHLORIDE CRYS ER 20 MEQ PO TBCR
20.0000 meq | EXTENDED_RELEASE_TABLET | Freq: Once | ORAL | Status: AC
  Administered 2019-07-03: 20 meq via ORAL
  Filled 2019-07-03: qty 1

## 2019-07-03 MED ORDER — MAGNESIUM SULFATE 4 GM/100ML IV SOLN
4.0000 g | Freq: Once | INTRAVENOUS | Status: AC
  Administered 2019-07-03: 4 g via INTRAVENOUS
  Filled 2019-07-03: qty 100

## 2019-07-03 MED ORDER — ACETAMINOPHEN 500 MG PO TABS
500.0000 mg | ORAL_TABLET | Freq: Four times a day (QID) | ORAL | Status: AC | PRN
  Administered 2019-07-03 – 2019-07-04 (×2): 500 mg via ORAL
  Filled 2019-07-03 (×2): qty 1

## 2019-07-03 MED ORDER — SACUBITRIL-VALSARTAN 24-26 MG PO TABS
1.0000 | ORAL_TABLET | Freq: Two times a day (BID) | ORAL | Status: DC
  Administered 2019-07-03 – 2019-07-04 (×3): 1 via ORAL
  Filled 2019-07-03 (×3): qty 1

## 2019-07-03 NOTE — Progress Notes (Signed)
Patients wife took diamond ring and watch home.

## 2019-07-03 NOTE — Progress Notes (Signed)
Covid test not done today, test results only good for 48 hrs. Test will be obtained upon completion of insurance authorization.

## 2019-07-03 NOTE — Progress Notes (Signed)
PROGRESS NOTE                                                                                                                                                                                                             Patient Demographics:    Noah [name], is a 79 y.o. male, DOB - 1940/11/30, ZOX:[id number]  Admit date - 06/23/2019   Admitting Physician Carin Hock, MD  Outpatient Primary MD for the patient is Sasser, Clarene Critchley, MD  LOS - 9  Chief Complaint  Patient presents with  . Shortness of Breath       Brief Narrative   Patient is a 79 y.o. male with past medical history of CAD s/p PCI in 2008, ischemic cardiomyopathy, combined chronic systolic and diastolic heart failure, s/p BiV ICD, A. fib on anticoagulation, DM, HTN, GERD, HLD, OSA-3 L nocturnally, CKD stage IIIa who presented to [facility] on 2/6 with hypothermia and hypotension.  Chest x-ray showed pulmonary edema, patient was started on Levophed infusion and transferred to [facility] course complicated by worsening somnolence, hypercarbia- required intubation on 3/8, he was transferred to my care on 07/01/2019 on day 7 of his hospital stay.   Significant events:  3/06 Admit  3/08 Intubated for hypercarbic respiratory failure 3/08 TTE-EF 35-40% 3/09 Intermittent agitation overnight 3/13 transfer to [facility]  Antimicrobial therapy: Vancomycin: 3/6>> 3/7 Cefepime: 3/6>> 3/8 Unasyn: 3/9>> 3/10  Microbiology data: 3/6 influenza/Covid: Negative 3/6 blood cultures: Negative 3/6 urine culture: Enterococcus faecalis 3/7 MRSA PCR negative  Procedures : ETT 3/8 >>3/10 R Radial ALine 3/7 >>  Consults: [facility] Cardiology on 3/13    Subjective:   Patient in bed, appears comfortable, denies any headache, no fever, no chest pain or pressure, no shortness of breath , no abdominal pain. No focal weakness.   Assessment  & Plan :     Shock/hypotension most likely due to sepsis caused by  Enterococcus UTI:  Treated by critical care, has finished antibiotic treatment and sepsis pathophysiology has resolved, there was question of relative adrenal insufficiency but currently blood pressure is stable off of steroids.  His high-dose blood pressure medications at home could be contributing to his hypotension in the setting of UTI.  He currently seems to be tolerating low-dose beta-blocker well.  Sherryll Burger continues to be on hold.  Will monitor closely.  Enterococcus UTI: Has completed a course of treatment-follow fever curve and WBC.  Acute metabolic encephalopathy: Secondary to metabolic acidosis, hypercarbia-CT head negative.  Condition has improved, BiPAP settings changed further on 07/01/2019.  No focal deficits.  Sepsis pathophysiology and UTI have resolved.  Acute on chronic hypercarbic/hypoxic respiratory failure: Most likely due to hypercarbia, mild element of CHF.  Intubated for worsening hypercarbia on 3/8-extubated on 3/10.  On BiPAP at night for OSA-currently stable on 3 L in am.  Morbid Obesity with OSA, OHS: BMI of 41, follow with PCP for weight loss, uses CPAP at home, continue nighttime BiPAP or CPAP.  As tolerated.  Acute on chronic systolic/diastolic heart failure (EF 35-40% by TTE on 3/8) has underlying ischemic cardiomyopathy, CAD s/p PCI in 2008, has AICD: Edema improved after diuresis, blood pressure has stabilized, on low-dose beta-blocker, Entresto on hold due to severe hypotension upon admission, cardiology following, low-dose Lasix continued.  Is developing some intravascular dehydration and hypernatremia hence encouraged to take ice chips as free water intake is still not allowed due to underlying dysphagia.  Chronic atrial fibrillation, [place] vas 2 score of at least 3: Telemetry with paced rhythm-rate controlled with metoprolol-Coumadin per pharmacy.  INR therapeutic.  AKI on CKD stage IIIa: AKI hemodynamically mediated-creatinine close to baseline.  Stable UA  and renal ultrasound.  Hypernatremia.  Improving with increased free water intake.  Hypomagnesemia.  Replace.    Dysphagia:  Cleared by speech for regular diet.  Deconditioning/debility: Secondary to acute illness-suspect some amount of frailty at baseline.  Evaluated by rehab services-plans are for SNF on discharge if patient is agreeable.  DM-2: CBGs stable-SSI  Lab Results  Component Value Date   HGBA1C 8.3 (H) 06/25/2019   CBG (last 3)  Recent Labs    07/02/19 2328 07/03/19 0247 07/03/19 0832  GLUCAP 142* 113* 161*      Family Communication  :  Wife and daughter [name] on 07/01/19, daughter 07/03/19  Code Status :  Full  Disposition Plan  :  SNF clinically better, still getting treated for  CHF, hypernatremia, needs close monitoring of BMP and fluid status.  Consults  : Cards  Procedures  :    TTE  IMPRESSIONS  1. Technically difficult; definity used; inferoseptal, inferolateral and apical hypokinesis; overall moderate LV dysfunction; mild MR.  2. Left ventricular ejection fraction, by estimation, is 35 to 40%. The left ventricle has moderately decreased function. The left ventricle demonstrates regional wall motion abnormalities (see scoring diagram/findings for description). The left ventricular internal cavity size was mildly dilated. Left ventricular diastolic parameters are indeterminate.  3. Right ventricular systolic function is normal. The right ventricular size is normal. There is moderately elevated pulmonary artery systolic pressure.  4. The mitral valve is normal in structure. Mild mitral valve regurgitation. No evidence of mitral stenosis.  5. The aortic valve is tricuspid. Aortic valve regurgitation is not visualized. Mild aortic valve sclerosis is present, with no evidence of aortic valve stenosis.  6. The inferior vena cava is dilated in size with <50% respiratory variability, suggesting right atrial pressure of 15 mmHg.   CT HEAD - non acute  Renal US  - non acuite   DVT Prophylaxis  :  Coumadin  Lab Results  Component Value Date   INR 3.3 (H) 07/03/2019   INR 3.2 (H) 07/02/2019   INR 2.9 (H) 07/01/2019    Lab Results  Component Value Date   PLT 117 (L) 07/03/2019    Diet :  Diet Order            Diet regular Room service appropriate? Yes with Assist; Fluid consistency: Thin  Diet  effective now               Inpatient Medications Scheduled Meds: . Chlorhexidine Gluconate Cloth  6 each Topical Daily  . FLUoxetine  20 mg Oral QPM  . furosemide  40 mg Oral BID  . insulin aspart  0-20 Units Subcutaneous Q4H  . levETIRAcetam  750 mg Oral BID  . mouth rinse  15 mL Mouth Rinse BID  . Melatonin  3 mg Oral QHS  . metoprolol tartrate  25 mg Oral BID  . pantoprazole  40 mg Oral Daily  . potassium chloride  20 mEq Oral Once  . QUEtiapine  25 mg Oral QHS  . sacubitril-valsartan  1 tablet Oral BID  . sodium chloride flush  10-40 mL Intracatheter Q12H  . tamsulosin  0.8 mg Oral QPM  . Warfarin - Pharmacist Dosing Inpatient   Does not apply q1800   Continuous Infusions: . magnesium sulfate bolus IVPB     PRN Meds:.Resource ThickenUp Clear  Antibiotics  :   Anti-infectives (From admission, onward)   Start     Dose/Rate Route Frequency Ordered Stop   06/26/19 1800  vancomycin (VANCOREADY) IVPB 1500 mg/300 mL  Status:  Discontinued     1,500 mg 150 mL/hr over 120 Minutes Intravenous Every 48 hours 06/25/19 1002 06/25/19 1431   06/26/19 1000  Ampicillin-Sulbactam (UNASYN) 3 g in sodium chloride 0.9 % 100 mL IVPB  Status:  Discontinued     3 g 200 mL/hr over 30 Minutes Intravenous Every 6 hours 06/26/19 0919 06/27/19 1337   06/24/19 2200  ceFEPIme (MAXIPIME) 2 g in sodium chloride 0.9 % 100 mL IVPB  Status:  Discontinued     2 g 200 mL/hr over 30 Minutes Intravenous Every 12 hours 06/24/19 1134 06/26/19 0851   06/24/19 1800  vancomycin (VANCOREADY) IVPB 1500 mg/300 mL  Status:  Discontinued     1,500 mg 150 mL/hr over 120  Minutes Intravenous Every 24 hours 06/23/19 1759 06/25/19 1002   06/24/19 0100  ceFEPIme (MAXIPIME) 2 g in sodium chloride 0.9 % 100 mL IVPB  Status:  Discontinued     2 g 200 mL/hr over 30 Minutes Intravenous Every 8 hours 06/23/19 1759 06/24/19 1134   06/23/19 1800  vancomycin (VANCOREADY) IVPB 2000 mg/400 mL     2,000 mg 200 mL/hr over 120 Minutes Intravenous  Once 06/23/19 1759 06/23/19 2056   06/23/19 1700  ceFEPIme (MAXIPIME) 2 g in sodium chloride 0.9 % 100 mL IVPB     2 g 200 mL/hr over 30 Minutes Intravenous  Once 06/23/19 1655 06/23/19 1741   06/23/19 1700  metroNIDAZOLE (FLAGYL) IVPB 500 mg     500 mg 100 mL/hr over 60 Minutes Intravenous  Once 06/23/19 1655 06/23/19 1849   06/23/19 1700  vancomycin (VANCOCIN) IVPB 1000 mg/200 mL premix  Status:  Discontinued     1,000 mg 200 mL/hr over 60 Minutes Intravenous  Once 06/23/19 1655 06/23/19 1759          Objective:   Vitals:   07/02/19 2224 07/02/19 2329 07/03/19 0248 07/03/19 0800  BP:  (!) 130/56 (!) 156/62 (!) 147/67  Pulse: 82 64 69 81  Resp: 14 14 18 17   Temp:  (!) 97.1 F (36.2 C) (!) 96.6 F (35.9 C) 97.6 F (36.4 C)  TempSrc:  Axillary Axillary Oral  SpO2: 97% 97% 98% 100%  Weight:      Height:        SpO2: 100 % O2 Flow  Rate (L/min): 4 L/min FiO2 (%): 40 %  Wt Readings from Last 3 Encounters:  06/29/19 124.6 kg  06/01/19 122.7 kg  05/28/19 124.3 kg     Intake/Output Summary (Last 24 hours) at 07/03/2019 1039 Last data filed at 07/03/2019 0900 Gross per 24 hour  Intake 100 ml  Output 1425 ml  Net -1325 ml     Physical Exam  Awake Alert, No new F.N deficits, Normal affect [place].AT,PERRAL Supple Neck,No JVD, No cervical lymphadenopathy appriciated.  Symmetrical Chest wall movement, Good air movement bilaterally, CTAB RRR,No Gallops, Rubs or new Murmurs, No Parasternal Heave +ve B.Sounds, Abd Soft, No tenderness, No organomegaly appriciated, No rebound - guarding or rigidity. No Cyanosis,  Clubbing , trace edema, No new Rash or bruise    Data Review:    CBC Recent Labs  Lab 06/28/19 0405 06/30/19 0607 07/01/19 0511 07/02/19 0436 07/03/19 0530  WBC 8.6 7.7 8.1 8.5 8.7  HGB 8.6* 8.1* 8.8* 7.9* 8.9*  HCT 29.5* 28.1* 30.3* 27.2* 29.3*  PLT 109* 102* 117* 122* 117*  MCV 93.9 96.2 95.0 94.4 93.0  MCH 27.4 27.7 27.6 27.4 28.3  MCHC 29.2* 28.8* 29.0* 29.0* 30.4  RDW 14.2 13.5 13.2 13.4 13.3  LYMPHSABS  --   --   --  1.7 1.7  MONOABS  --   --   --  0.7 0.6  EOSABS  --   --   --  0.5 0.4  BASOSABS  --   --   --  0.0 0.1    Chemistries  Recent Labs  Lab 06/27/19 0423 06/27/19 2236 06/28/19 0405 06/28/19 0405 06/29/19 0239 06/30/19 0607 07/01/19 0511 07/02/19 0436 07/03/19 0530  NA 148*   < > 151*  --   --  142 151* 151* 148*  K 3.9   < > 4.2  --   --  4.3 4.2 4.1 3.5  CL 114*  --  114*  --   --  105 109 109 106  CO2 25  --  27  --   --  29 32 32 33*  GLUCOSE 125*  --  123*  --   --  340* 105* 99 108*  BUN 79*  --  76*  --   --  55* 49* 36* 32*  CREATININE 1.62*  --  1.40*  --   --  1.34* 1.21 1.20 1.21  CALCIUM 8.3*  --  8.6*  --   --  8.7* 9.0 9.0 9.0  AST 13*  --   --   --   --   --  15  --   --   ALT 25  --   --   --   --   --  19  --   --   ALKPHOS 101  --   --   --   --   --  83  --   --   BILITOT 0.6  --   --   --   --   --  0.9  --   --   MG  --   --   --   --   --   --  1.9 1.8 1.5*  INR 3.3*  --  2.6*   < > 2.5* 2.7* 2.9* 3.2* 3.3*   < > = values in this interval not displayed.   Recent Labs  Lab 07/01/19 0511 07/02/19 0436 07/03/19 0530  BNP 1,072.6* 1,000.5* 824.4*    ------------------------------------------------------------------------------------------------------------------ No results for  input(s): CHOL, HDL, LDLCALC, TRIG, CHOLHDL, LDLDIRECT in the last 72 hours.  Lab Results  Component Value Date   HGBA1C 8.3 (H) 06/25/2019    ------------------------------------------------------------------------------------------------------------------ No results for input(s): TSH, T4TOTAL, T3FREE, THYROIDAB in the last 72 hours.  Invalid input(s): FREET3 ------------------------------------------------------------------------------------------------------------------ No results for input(s): VITAMINB12, FOLATE, FERRITIN, TIBC, IRON, RETICCTPCT in the last 72 hours.  Coagulation profile Recent Labs  Lab 06/29/19 0239 06/30/19 0607 07/01/19 0511 07/02/19 0436 07/03/19 0530  INR 2.5* 2.7* 2.9* 3.2* 3.3*    No results for input(s): DDIMER in the last 72 hours.  Cardiac Enzymes No results for input(s): CKMB, TROPONINI, MYOGLOBIN in the last 168 hours.  Invalid input(s): CK ------------------------------------------------------------------------------------------------------------------    Component Value Date/Time   BNP 824.4 (H) 07/03/2019 0530    Micro Results Recent Results (from the past 240 hour(s))  Blood Culture (routine x 2)     Status: None   Collection Time: 06/23/19  5:28 PM   Specimen: BLOOD RIGHT FOREARM  Result Value Ref Range Status   Specimen Description BLOOD RIGHT FOREARM  Final   Special Requests   Final    BOTTLES DRAWN AEROBIC AND ANAEROBIC Blood Culture adequate volume   Culture   Final    NO GROWTH 5 DAYS Performed at [facility], [address] [place], [place] [address]    Report Status 06/28/2019 FINAL  Final  Blood Culture (routine x 2)     Status: None   Collection Time: 06/23/19  5:33 PM   Specimen: BLOOD RIGHT HAND  Result Value Ref Range Status   Specimen Description BLOOD RIGHT HAND  Final   Special Requests   Final    BOTTLES DRAWN AEROBIC AND ANAEROBIC Blood Culture adequate volume   Culture   Final    NO GROWTH 5 DAYS Performed at [facility], [address], [place], [place] [address]    Report Status 06/28/2019 FINAL  Final  Respiratory Panel by RT PCR (Flu  A&B, Covid) - Nasopharyngeal Swab     Status: None   Collection Time: 06/23/19  6:10 PM   Specimen: Nasopharyngeal Swab  Result Value Ref Range Status   SARS Coronavirus 2 by RT PCR NEGATIVE NEGATIVE Final    Comment: (NOTE) SARS-CoV-2 target nucleic acids are NOT DETECTED. The SARS-CoV-2 RNA is generally detectable in upper respiratoy specimens during the acute phase of infection. The lowest concentration of SARS-CoV-2 viral copies this assay can detect is 131 copies/mL. A negative result does not preclude SARS-Cov-2 infection and should not be used as the sole basis for treatment or other patient management decisions. A negative result may occur with  improper specimen collection/handling, submission of specimen other than nasopharyngeal swab, presence of viral mutation(s) within the areas targeted by this assay, and inadequate number of viral copies (<131 copies/mL). A negative result must be combined with clinical observations, patient history, and epidemiological information. The expected result is Negative. Fact Sheet for Patients:  https://www.moore.com/ Fact Sheet for Healthcare Providers:  https://www.young.biz/ This test is not yet ap proved or cleared by the [place] FDA and  has been authorized for detection and/or diagnosis of SARS-CoV-2 by FDA under an Emergency Use Authorization (EUA). This EUA will remain  in effect (meaning this test can be used) for the duration of the COVID-19 declaration under Section 564(b)(1) of the Act, 21 U.S.C. section 360bbb-3(b)(1), unless the authorization is terminated or revoked sooner.    Influenza A by PCR NEGATIVE NEGATIVE Final   Influenza B by PCR  NEGATIVE NEGATIVE Final    Comment: (NOTE) The Xpert Xpress SARS-CoV-2/FLU/RSV assay is intended as an aid in  the diagnosis of influenza from Nasopharyngeal swab specimens and  should not be used as a sole basis for treatment. Nasal washings  and  aspirates are unacceptable for Xpert Xpress SARS-CoV-2/FLU/RSV  testing. Fact Sheet for Patients: https://www.moore.com/ Fact Sheet for Healthcare Providers: https://www.young.biz/ This test is not yet approved or cleared by the [place] FDA and  has been authorized for detection and/or diagnosis of SARS-CoV-2 by  FDA under an Emergency Use Authorization (EUA). This EUA will remain  in effect (meaning this test can be used) for the duration of the  Covid-19 declaration under Section 564(b)(1) of the Act, 21  U.S.C. section 360bbb-3(b)(1), unless the authorization is  terminated or revoked. Performed at [facility], [address], [place], [place] [address]   Urine culture     Status: Abnormal   Collection Time: 06/23/19  8:30 PM   Specimen: In/Out Cath Urine  Result Value Ref Range Status   Specimen Description   Final    IN/OUT CATH URINE Performed at [facility], [address], [place], [place] [address]    Special Requests   Final    NONE Performed at [facility], [address] [place], [place] [address]    Culture 5,000 COLONIES/mL ENTEROCOCCUS FAECALIS (A)  Final   Report Status 06/26/2019 FINAL  Final   Organism ID, Bacteria ENTEROCOCCUS FAECALIS (A)  Final      Susceptibility   Enterococcus faecalis - MIC*    AMPICILLIN <=2 SENSITIVE Sensitive     NITROFURANTOIN <=16 SENSITIVE Sensitive     VANCOMYCIN 1 SENSITIVE Sensitive     * 5,000 COLONIES/mL ENTEROCOCCUS FAECALIS  MRSA PCR Screening     Status: None   Collection Time: 06/24/19  4:02 AM   Specimen: Nasopharyngeal  Result Value Ref Range Status   MRSA by PCR NEGATIVE NEGATIVE Final    Comment:        The GeneXpert MRSA Assay (FDA approved for NASAL specimens only), is one component of a comprehensive MRSA colonization surveillance program. It is not intended to diagnose MRSA infection nor to guide or monitor treatment for MRSA infections. Performed at  [facility], [address], [place], [place] [address]     Radiology Reports DG Abd 1 View  Result Date: 06/25/2019 CLINICAL DATA:  Respiratory failure.  OG tube placement EXAM: ABDOMEN - 1 VIEW COMPARISON:  Chest radiograph 06/25/2019 FINDINGS: NG tube extends to level of the diaphragm midline. Difficult to tell if tip of the tube is within stomach or hiatal hernia. IMPRESSION: NG tube extends in the esophagus to the level the diaphragm. Tip may be within the proximal stomach or within a hiatal hernia. Electronically Signed   By: Genevive Bi M.D.   On: 06/25/2019 07:41   CT HEAD WO CONTRAST  Result Date: 06/25/2019 CLINICAL DATA:  Encephalopathy. EXAM: CT HEAD WITHOUT CONTRAST TECHNIQUE: Contiguous axial images were obtained from the base of the skull through the vertex without intravenous contrast. COMPARISON:  April 10, 2019. FINDINGS: Brain: Mild chronic ischemic white matter disease is noted. No mass effect or midline shift is noted. Ventricular size is within normal limits. There is no evidence of mass lesion, hemorrhage or acute infarction. Vascular: No hyperdense vessel or unexpected calcification. Skull: Normal. Negative for fracture or focal lesion. Sinuses/Orbits: No acute finding. Other: None. IMPRESSION: Mild chronic ischemic white matter disease. No acute intracranial  abnormality seen. Electronically Signed   By: Lupita Raider M.D.   On: 06/25/2019 08:43   US RENAL  Result Date: 06/26/2019 CLINICAL DATA:  Acute kidney injury EXAM: RENAL / URINARY TRACT ULTRASOUND COMPLETE COMPARISON:  Ultrasound 04/16/2019 FINDINGS: Right Kidney: Renal measurements: 11.7 x 6 x 5.7 cm = volume: 206.7 mL . Echogenicity within normal limits. No mass or hydronephrosis visualized. Left Kidney: Renal measurements: 11.6 x 6.6 x 4.7 cm = volume: 186.8 mL. Echogenicity within normal limits. No mass or hydronephrosis visualized. Bladder: Appears nearly empty.  Foley catheter. Other: None.  IMPRESSION: Negative for hydronephrosis.  Foley decompression of the bladder. Electronically Signed   By: Jasmine Pang M.D.   On: 06/26/2019 20:20   DG Chest 1V REPEAT Same Day  Result Date: 06/23/2019 CLINICAL DATA:  Evaluate for pulmonary edema. EXAM: CHEST - 1 VIEW SAME DAY COMPARISON:  Chest radiograph 06/23/2019 at 4:19 p.m. FINDINGS: Stable cardiomediastinal contours with enlarged heart size. Left chest pacer in place. Central vascular congestion. There are diffuse bilateral heterogeneous pulmonary opacities likely moderate edema. Mild atelectatic change at the left base. No pneumothorax. Possible trace left pleural effusion. No acute finding in the visualized skeleton. IMPRESSION: Cardiomegaly with central congestion and bilateral pulmonary opacities likely moderate pulmonary edema. Possible trace left effusion. Electronically Signed   By: Emmaline Kluver M.D.   On: 06/23/2019 20:45   DG Chest Port 1 View  Result Date: 07/01/2019 CLINICAL DATA:  Shortness of breath EXAM: PORTABLE CHEST 1 VIEW COMPARISON:  06/28/2019 FINDINGS: Cardiomegaly with mild interstitial edema. Patchy bilateral lower lobe opacities, likely atelectasis. Small bilateral pleural effusions. Left subclavian ICD. Old right posterior rib fracture deformities. IMPRESSION: Cardiomegaly with mild interstitial edema and small bilateral pleural effusions. Patchy bilateral lower lobe opacities, likely atelectasis. Electronically Signed   By: Charline Bills M.D.   On: 07/01/2019 08:13   DG CHEST PORT 1 VIEW  Result Date: 06/28/2019 CLINICAL DATA:  80 year old male with history of shortness of breath and acute respiratory failure with hypoxia. EXAM: PORTABLE CHEST 1 VIEW COMPARISON:  Chest x-ray 06/27/2019. FINDINGS: Patient has been extubated. Previously noted nasogastric tube has been withdrawn. There is a right upper extremity PICC with tip terminating in the distal superior vena cava. Left-sided biventricular pacemaker/AICD with  lead tips projecting over the expected location of the right atrium, right ventricular apex and overlying the the left ventricle via the coronary sinus and coronary veins. Low lung volumes. Patchy multifocal interstitial and airspace disease in the lungs bilaterally. Small bilateral pleural effusions. Cephalization of the pulmonary vasculature. Mild cardiomegaly. Upper mediastinal contours are within normal limits. IMPRESSION: 1. Support apparatus, as above. 2. Low lung volumes with evidence of mild congestive heart failure, as above. Electronically Signed   By: Trudie Reed M.D.   On: 06/28/2019 09:25   DG CHEST PORT 1 VIEW  Result Date: 06/27/2019 CLINICAL DATA:  Respiratory failure EXAM: PORTABLE CHEST 1 VIEW COMPARISON:  06/26/2019 FINDINGS: The endotracheal tube and NG tubes are stable. Persistent cardiac enlargement. Much improved lung aeration suggesting resolving pulmonary edema. There is a persistent small left pleural effusion and left lower lobe atelectasis. IMPRESSION: 1. Stable support apparatus. 2. Improving lung aeration with resolving pulmonary edema. 3. Persistent small left effusion and left basilar atelectasis. Electronically Signed   By: Rudie Meyer M.D.   On: 06/27/2019 09:17   DG CHEST PORT 1 VIEW  Result Date: 06/26/2019 CLINICAL DATA:  Respiratory failure. EXAM: PORTABLE CHEST 1 VIEW COMPARISON:  Chest x-ray 06/25/2019  FINDINGS: The endotracheal tube is 3.5 cm above the carina. The NG tube is coursing down the esophagus and into the stomach. The pacer wires are stable. Persistent cardiac enlargement. Persistent diffuse interstitial process, bilateral pleural effusions and streaky bibasilar atelectasis. No focal airspace consolidation or pneumothorax. IMPRESSION: 1. Stable support apparatus. 2. Persistent CHF changes. Electronically Signed   By: Marijo Sanes M.D.   On: 06/26/2019 05:04   DG CHEST PORT 1 VIEW  Result Date: 06/25/2019 CLINICAL DATA:  Intubation EXAM: PORTABLE  CHEST 1 VIEW COMPARISON:  Earlier today FINDINGS: New endotracheal tube with tip 18 mm above the carina. New enteric tube which reaches the stomach. Cardiomegaly with interstitial opacities and small pleural effusion, the former is intervally improved. No visible pneumothorax. Cardiomegaly and biventricular pacer/ICD. IMPRESSION: 1. New endotracheal tube with tip 18 mm above the carina. 2. Probable CHF.  Mildly improved aeration. Electronically Signed   By: Monte Fantasia M.D.   On: 06/25/2019 07:25   DG Chest Port 1 View  Result Date: 06/25/2019 CLINICAL DATA:  79 year old male with shortness of breath. EXAM: PORTABLE CHEST 1 VIEW COMPARISON:  Portable chest 06/23/2019 and earlier. FINDINGS: Portable AP semi upright view at 0331 hours. Stable left chest cardiac AICD. Stable cardiac size and mediastinal contours. No pneumothorax. Chronic small left pleural effusion or pleural scarring appears stable since December. Other lung markings have not significantly changed since that time. No pneumothorax or air bronchograms. Chronic right rib fractures. No acute osseous abnormality identified. Paucity of bowel gas in the upper abdomen. IMPRESSION: Ventilation not significantly changed compared to December with small left pleural effusion and nonspecific increased interstitial markings. Interstitial edema is difficult to exclude. Electronically Signed   By: Genevie Ann M.D.   On: 06/25/2019 03:48   DG Chest Portable 1 View  Result Date: 06/23/2019 CLINICAL DATA:  79 year old male with shortness of breath. EXAM: PORTABLE CHEST 1 VIEW COMPARISON:  Chest radiograph dated 04/12/2019. FINDINGS: There is shallow inspiration. Left lung base density, likely chronic atelectasis/scarring. Developing infiltrate is not excluded. An area of airspace density in the right suprahilar region appears similar to prior radiograph, likely chronic. Clinical correlation is recommended. No large pleural effusion. No pneumothorax. There is  cardiomegaly with mild vascular congestion. Left pectoral AICD device. No acute osseous pathology. IMPRESSION: 1. Shallow inspiration with left lung base atelectasis/scarring. Developing infiltrate is less likely. Clinical correlation is recommended. 2. Stable appearing right suprahilar density, likely chronic. 3. Cardiomegaly with mild vascular congestion. Electronically Signed   By: Anner Crete M.D.   On: 06/23/2019 16:35   DG Fluoro Rm 1-60 Min - No Report  Result Date: 06/29/2019 Fluoroscopy was utilized by the requesting physician.  No radiographic interpretation.   ECHOCARDIOGRAM COMPLETE  Result Date: 06/25/2019    ECHOCARDIOGRAM REPORT   Patient Name:   Oswin E Beazley Date of Exam: 06/25/2019 Medical Rec #:  [id number]      Height:       68.0 in Accession #:    [id number]     Weight:       287.7 lb Date of Birth:  09/19/1940      BSA:          2.385 m Patient Age:    17 years       BP:           123/67 mmHg Patient Gender: M              HR:  80 bpm. Exam Location:  Inpatient Procedure: 2D Echo and Intracardiac Opacification Agent Indications:    Atrial Fibrillation I48.91  History:        Patient has prior history of Echocardiogram examinations, most                 recent 05/18/2018. Ischemic Cardiomyopathy, CAD, Pacemaker; Risk                 Factors:Hypertension, Dyslipidemia and Diabetes.  Sonographer:    Thurman Coyer RDCS (AE) Referring Phys: [id number] Aliene Beams  Sonographer Comments: Echo performed with patient supine and on artificial respirator and patient is morbidly obese. IMPRESSIONS  1. Technically difficult; definity used; inferoseptal, inferolateral and apical hypokinesis; overall moderate LV dysfunction; mild MR.  2. Left ventricular ejection fraction, by estimation, is 35 to 40%. The left ventricle has moderately decreased function. The left ventricle demonstrates regional wall motion abnormalities (see scoring diagram/findings for description). The left  ventricular internal cavity size was mildly dilated. Left ventricular diastolic parameters are indeterminate.  3. Right ventricular systolic function is normal. The right ventricular size is normal. There is moderately elevated pulmonary artery systolic pressure.  4. The mitral valve is normal in structure. Mild mitral valve regurgitation. No evidence of mitral stenosis.  5. The aortic valve is tricuspid. Aortic valve regurgitation is not visualized. Mild aortic valve sclerosis is present, with no evidence of aortic valve stenosis.  6. The inferior vena cava is dilated in size with <50% respiratory variability, suggesting right atrial pressure of 15 mmHg. FINDINGS  Left Ventricle: Left ventricular ejection fraction, by estimation, is 35 to 40%. The left ventricle has moderately decreased function. The left ventricle demonstrates regional wall motion abnormalities. Definity contrast agent was given IV to delineate the left ventricular endocardial borders. The left ventricular internal cavity size was mildly dilated. There is no left ventricular hypertrophy. Left ventricular diastolic parameters are indeterminate. Right Ventricle: The right ventricular size is normal. Right ventricular systolic function is normal. There is moderately elevated pulmonary artery systolic pressure. The tricuspid regurgitant velocity is 3.09 m/s, and with an assumed right atrial pressure of 15 mmHg, the estimated right ventricular systolic pressure is 53.2 mmHg. Left Atrium: Left atrial size was normal in size. Right Atrium: Right atrial size was normal in size. Pericardium: There is no evidence of pericardial effusion. Mitral Valve: The mitral valve is normal in structure. Normal mobility of the mitral valve leaflets. Mild mitral annular calcification. Mild mitral valve regurgitation. No evidence of mitral valve stenosis. Tricuspid Valve: The tricuspid valve is normal in structure. Tricuspid valve regurgitation is trivial. No evidence of  tricuspid stenosis. Aortic Valve: The aortic valve is tricuspid. Aortic valve regurgitation is not visualized. Mild aortic valve sclerosis is present, with no evidence of aortic valve stenosis. Pulmonic Valve: The pulmonic valve was not well visualized. Pulmonic valve regurgitation is not visualized. No evidence of pulmonic stenosis. Aorta: The aortic root is normal in size and structure. Venous: The inferior vena cava is dilated in size with less than 50% respiratory variability, suggesting right atrial pressure of 15 mmHg. IAS/Shunts: No atrial level shunt detected by color flow Doppler. Additional Comments: Technically difficult; definity used; inferoseptal, inferolateral and apical hypokinesis; overall moderate LV dysfunction; mild MR. A pacer wire is visualized.  LEFT VENTRICLE PLAX 2D LVIDd:         5.60 cm  Diastology LVIDs:         4.50 cm  LV e' lateral: 9.36 cm/s LV PW:  1.10 cm  LV e' medial:  9.14 cm/s LV IVS:        1.10 cm LVOT diam:     2.40 cm LV SV:         105 LV SV Index:   44 LVOT Area:     4.52 cm  RIGHT VENTRICLE RV S prime:     7.94 cm/s TAPSE (M-mode): 1.6 cm LEFT ATRIUM             Index       RIGHT ATRIUM           Index LA diam:        3.70 cm 1.55 cm/m  RA Area:     16.90 cm LA Vol (A2C):   51.5 ml 21.59 ml/m RA Volume:   37.00 ml  15.51 ml/m LA Vol (A4C):   50.1 ml 21.00 ml/m LA Biplane Vol: 51.6 ml 21.63 ml/m  AORTIC VALVE LVOT Vmax:   115.00 cm/s LVOT Vmean:  67.800 cm/s LVOT VTI:    0.233 m  AORTA Ao Root diam: 3.30 cm TRICUSPID VALVE TR Peak grad:   38.2 mmHg TR Vmax:        309.00 cm/s  SHUNTS Systemic VTI:  0.23 m Systemic Diam: 2.40 cm Olga Millers MD Electronically signed by Olga Millers MD Signature Date/Time: 06/25/2019/12:22:15 PM    Final    [place] EKG SITE RITE  Result Date: 06/26/2019 If Site Rite image not attached, placement could not be confirmed due to current cardiac rhythm.   Time Spent in minutes  30   Susa Raring M.D on 07/03/2019 at 10:39  AM  To page go to [email or web address] - password [facility]

## 2019-07-03 NOTE — Progress Notes (Signed)
  Speech Language Pathology Treatment: Dysphagia  Patient Details Name: Noah [name] MRN: [id number] DOB: 1940/05/29 Today's Date: 07/03/2019 Time: 0715-0740 SLP Time Calculation (min) (ACUTE ONLY): 25 min  Assessment / Plan / Recommendation Clinical Impression  Today follow up indicated to assess po tolerance, educate pt to aspiration precautions and results of his MBS. Pt with breakfast tray over him - functional meal observation completed with pt consuming banana, coffee and water.  NO indication of aspiration/dysphagia noted and pt observed to eat at slow pace.   No increased work of breathing noted during po intake.  Oral clearance adequate with clinically timely swallow.    SLP reviewed pt's prior MBS with him via video loops - noting his pharyngeal swallow to be strong.  Pt admits his appetite continues to be poor.  Voice reported to be near baseline per pt.   Eructation noted x2 during intake, which pt states he is taking a PPI.    No SLP follow up indicated as pt's dysphagia d/t intubation and delirium has resolved.  All education completed using teach back - Pt admits that po medications with puree is easier for him to swallow- thus advise to continue medication administration in this manner.    All education completed using teach back, SLP to sign off.    HPI HPI: Pt is a 79 yr old with PMHx CAD s/p PCI in 2008, ischemic cardiomyopathy, combined systolic and diastolic dysfunction, s/p BiV ICD, A fib, DM, HTN, GERD, HLD, OSA, and CKD Stage 3 who presented to [facility] ED on 3/6 with hypothermia and hypotension. Received 2.5 L IVF subsequent CXR showed pulmonary edema, started on Levophed gtt and transferred to [facility]. Chest x-ray 3/11: Low lung volumes with evidence of mild congestive heart failure. ETT 3/8-3/10  Limited MBS completed 3/12 secondary to pt's shoulders blocking larynx.  Pt diet advanced to regular/thin yesterday.  Today visit to assess po tolerance of advancement  and for pt education.      SLP Plan  All goals met       Recommendations  Diet recommendations: Regular;Thin liquid Liquids provided via: Straw;Cup Medication Administration: Whole meds with puree Supervision: Full supervision/cueing for compensatory strategies Compensations: Slow rate;Small sips/bites;Follow solids with liquid Postural Changes and/or Swallow Maneuvers: Seated upright 90 degrees;Upright 30-60 min after meal                Oral Care Recommendations: Oral care BID Follow up Recommendations: Skilled Nursing facility SLP Visit Diagnosis: Dysphagia, pharyngeal phase (R13.13) Plan: All goals met       GO                Macario Golds 07/03/2019, 8:03 AM  Kathleen Lime, MS [place] Office [phone]

## 2019-07-03 NOTE — Plan of Care (Signed)

## 2019-07-03 NOTE — Progress Notes (Signed)
Progress Note  Patient Name: Noah [name] Date of Encounter: 07/03/2019  Primary Cardiologist: Prentice Docker, MD   Subjective   Feeling better today.  More alert and engaged. Interested in when he might be ready to go to rehab. Recorded weight of 274 pounds is likely an error. Fluid balance almost neutral. BUN and sodium levels improving.  Inpatient Medications    Scheduled Meds: . Chlorhexidine Gluconate Cloth  6 each Topical Daily  . FLUoxetine  20 mg Oral QPM  . furosemide  40 mg Oral BID  . insulin aspart  0-20 Units Subcutaneous Q4H  . levETIRAcetam  750 mg Oral BID  . mouth rinse  15 mL Mouth Rinse BID  . Melatonin  3 mg Oral QHS  . metoprolol tartrate  12.5 mg Oral BID  . pantoprazole  40 mg Oral Daily  . QUEtiapine  25 mg Oral QHS  . sodium chloride flush  10-40 mL Intracatheter Q12H  . tamsulosin  0.8 mg Oral QPM  . Warfarin - Pharmacist Dosing Inpatient   Does not apply q1800   Continuous Infusions:  PRN Meds: Resource ThickenUp Clear   Vital Signs    Vitals:   07/02/19 1918 07/02/19 2224 07/02/19 2329 07/03/19 0248  BP: (!) 155/67  (!) 130/56 (!) 156/62  Pulse: 79 82 64 69  Resp: 18 14 14 18   Temp: 97.6 F (36.4 C)  (!) 97.1 F (36.2 C) (!) 96.6 F (35.9 C)  TempSrc: Oral  Axillary Axillary  SpO2: 99% 97% 97% 98%  Weight:      Height:        Intake/Output Summary (Last 24 hours) at 07/03/2019 0740 Last data filed at 07/02/2019 2330 Gross per 24 hour  Intake 210 ml  Output 925 ml  Net -715 ml   Last 3 Weights 06/29/2019 06/28/2019 06/27/2019  Weight (lbs) 274 lb 11.1 oz 290 lb 9.1 oz 292 lb 15.9 oz  Weight (kg) 124.6 kg 131.8 kg 132.9 kg      Telemetry    Mostly Atrial sensed (sinus), biventricular paced rhythm. Frequent but brief episodes of sustained atrial tachycardia - Personally Reviewed  ECG    No new tracing - Personally Reviewed  Physical Exam  Obese. Lying flat comfortably GEN: No acute distress.   Neck: No  JVD Cardiac: RRR, no murmurs, rubs, or gallops.  Respiratory: Clear to auscultation bilaterally. GI: Soft, nontender, non-distended  MS: No edema; No deformity. Neuro:  Nonfocal  Psych: Normal affect   Labs    High Sensitivity Troponin:   Recent Labs  Lab 06/23/19 1903 06/23/19 2125  TROPONINIHS 6 6      Chemistry Recent Labs  Lab 06/27/19 0423 06/27/19 2236 07/01/19 0511 07/02/19 0436 07/03/19 0530  NA 148*   < > 151* 151* 148*  K 3.9   < > 4.2 4.1 3.5  CL 114*   < > 109 109 106  CO2 25   < > 32 32 33*  GLUCOSE 125*   < > 105* 99 108*  BUN 79*   < > 49* 36* 32*  CREATININE 1.62*   < > 1.21 1.20 1.21  CALCIUM 8.3*   < > 9.0 9.0 9.0  PROT 5.4*  --  5.6*  --   --   ALBUMIN 2.2*  --  2.4*  --   --   AST 13*  --  15  --   --   ALT 25  --  19  --   --  ALKPHOS 101  --  83  --   --   BILITOT 0.6  --  0.9  --   --   GFRNONAA 40*   < > 57* 58* 57*  GFRAA 46*   < > >60 >60 >60  ANIONGAP 9   < > 10 10 9    < > = values in this interval not displayed.     Hematology Recent Labs  Lab 06/30/19 0607 07/01/19 0511 07/02/19 0436  WBC 7.7 8.1 8.5  RBC 2.92* 3.19* 2.88*  HGB 8.1* 8.8* 7.9*  HCT 28.1* 30.3* 27.2*  MCV 96.2 95.0 94.4  MCH 27.7 27.6 27.4  MCHC 28.8* 29.0* 29.0*  RDW 13.5 13.2 13.4  PLT 102* 117* 122*    BNP Recent Labs  Lab 07/01/19 0511 07/02/19 0436  BNP 1,072.6* 1,000.5*     DDimer No results for input(s): DDIMER in the last 168 hours.   Radiology    DG Chest Port 1 View  Result Date: 07/01/2019 CLINICAL DATA:  Shortness of breath EXAM: PORTABLE CHEST 1 VIEW COMPARISON:  06/28/2019 FINDINGS: Cardiomegaly with mild interstitial edema. Patchy bilateral lower lobe opacities, likely atelectasis. Small bilateral pleural effusions. Left subclavian ICD. Old right posterior rib fracture deformities. IMPRESSION: Cardiomegaly with mild interstitial edema and small bilateral pleural effusions. Patchy bilateral lower lobe opacities, likely atelectasis.  Electronically Signed   By: 08/28/2019 M.D.   On: 07/01/2019 08:13    Cardiac Studies   Echo 06/25/2019 1. Technically difficult; definity used; inferoseptal, inferolateral and  apical hypokinesis; overall moderate LV dysfunction; mild MR.  2. Left ventricular ejection fraction, by estimation, is 35 to 40%. The  left ventricle has moderately decreased function. The left ventricle  demonstrates regional wall motion abnormalities (see scoring  diagram/findings for description). The left  ventricular internal cavity size was mildly dilated. Left ventricular  diastolic parameters are indeterminate.  3. Right ventricular systolic function is normal. The right ventricular  size is normal. There is moderately elevated pulmonary artery systolic  pressure.  4. The mitral valve is normal in structure. Mild mitral valve  regurgitation. No evidence of mitral stenosis.  5. The aortic valve is tricuspid. Aortic valve regurgitation is not  visualized. Mild aortic valve sclerosis is present, with no evidence of  aortic valve stenosis.  6. The inferior vena cava is dilated in size with <50% respiratory  variability, suggesting right atrial pressure of 15 mmHg.    Patient Profile     79 y.o. 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  Assessment & Plan    1. CHF: Doubt that today's recorded weight is accurate. Limited physical exam due to morbid obesity, but his weight is back to admission weight and what is  considered to be his baseline.  Able to lie supine without breathing difficulty.  Switched to oral diuretics.  Try to maintain current volume status.  We will add back half of his previous dose of Entresto today, if blood pressure and if renal parameters tolerate that well, will increase to his previous home dose. 2. AFib: In and out of  atrial tachycardia, possibly flutter, no real atrial fibrillation that I can see in last 24h. And overall arrhythmia burden appears less.  Last device check January 28 shows roughly 9% stable burden of atrial arrhythmia and only 92% biventricular pacing.  On warfarin. 3. CRT-D: We will check his device on this admission.  4. CAD: No angina. 5.  Hypernatremia: Improving.  He has significant free water deficiency, but does not like to drink fluids since he is on aspiration precautions.  Would like to avoid giving him D5W due to diabetes. 6. OSA:  Using CPAP.     For questions or updates, please contact [place] Please consult [email or web address] for contact info under        Signed, Sanda Klein, MD  07/03/2019, 7:40 AM

## 2019-07-03 NOTE — Progress Notes (Signed)
Patient ID: Noah [name], male   DOB: May 30, 1940, 79 y.o.   MRN: [id number]  Biventricular defibrillator interrogation shows sharp increase in arrhythmia burden to 30-44% since the week of March 5 (virtually no atrial arrhythmia throughout the months of January and February).  There was a corresponding reduction in his CRT. Thoracic impedance is consistent with volume overload for the last 21 days.

## 2019-07-03 NOTE — Evaluation (Signed)
Occupational Therapy Evaluation Patient Details Name: Noah [name] MRN: [id number] DOB: [birth year] Today's Date: 07/03/2019    History of Present Illness 79 y/o M who presented to [facility] on 3/6 with reports of SOB, found to by hypotensive and hypothermic, treated for sepsis. Pt required intubation on 3/8 due to letahrgy and hypercarbia. Extubated on 3/10.   Clinical Impression   Pt progressing to EOB bathing with sit to stands and taking a few steps toward HOB. Pt following all commands; pt able to verbalize "I''m feeling dizzy" and asked to sit down. Pt limited by decreased awareness of deficits, decreased ability to care for self safely, decreased endurance. Pt minguardA to minA for sit to stands x3 from bed slightly elevated with RW. Pt modA overall for bathing task at EOB. Safety cues for hand placement required for each transfer. O2 >90% on 4L O2; BP 151/73 (94) 16 RR when feeling dizzy in sitting; 115/64 BP in standing when dizzy. Pt would benefit from continued OT skilled services for ADL,mobility and energy conservation. OT following acutely.    Follow Up Recommendations  SNF;Supervision/Assistance - 24 hour    Equipment Recommendations  3 in 1 bedside commode    Recommendations for Other Services       Precautions / Restrictions Precautions Precautions: Fall Restrictions Weight Bearing Restrictions: No      Mobility Bed Mobility Overal bed mobility: Needs Assistance Bed Mobility: Supine to Sit;Sit to Supine     Supine to sit: Min assist;HOB elevated Sit to supine: Mod assist;HOB elevated   General bed mobility comments: MinA for trunk elevation; modA to manage BLEs and to scoot to [facility]  Transfers Overall transfer level: Needs assistance Equipment used: Rolling walker (2 wheeled) Transfers: Sit to/from Stand Sit to Stand: Min guard;Min assist         General transfer comment: Pt minguardA to minA for sit to stands x3 from bed slightly elevated with RW     Balance Overall balance assessment: Needs assistance Sitting-balance support: Single extremity supported;Feet supported Sitting balance-Leahy Scale: Good     Standing balance support: Bilateral upper extremity supported Standing balance-Leahy Scale: Poor Standing balance comment: reliant on external support                           ADL either performed or assessed with clinical judgement   ADL Overall ADL's : Needs assistance/impaired     Grooming: Set up;Sitting   Upper Body Bathing: Minimal assistance;Sitting Upper Body Bathing Details (indicate cue type and reason): Pt unfocused to perform task; but able to wash around heatrt monitor lines Lower Body Bathing: Moderate assistance;Sitting/lateral leans;Sit to/from stand;Cueing for safety Lower Body Bathing Details (indicate cue type and reason): sit to stand with minguard And stood x1 min for per care Upper Body Dressing : Minimal assistance;Sitting   Lower Body Dressing: Moderate assistance               Functional mobility during ADLs: Minimal assistance;Moderate assistance;Cueing for safety;Rolling walker General ADL Comments: Pt limited by decreased awareness of deficits, decreased ability to care for self safely, decreased endurance.     Vision   Vision Assessment?: No apparent visual deficits     Perception     Praxis      Pertinent Vitals/Pain Pain Assessment: No/denies pain     Hand Dominance     Extremity/Trunk Assessment Upper Extremity Assessment Upper Extremity Assessment: Generalized weakness   Lower Extremity Assessment Lower Extremity Assessment:  Generalized weakness       Communication     Cognition Arousal/Alertness: Awake/alert Behavior During Therapy: WFL for tasks assessed/performed Overall Cognitive Status: Impaired/Different from baseline Area of Impairment: Safety/judgement                         Safety/Judgement: Decreased awareness of deficits      General Comments: Pt following all commands; pt able to verbalize "I'm feeling dizzy" and asked to sit down   General Comments  O2 >90% on 4L O2; BP 151/73 (94) 16 RR when feeling dizzy in sitting; 115/64 BP in standing when dizzy    Exercises     Shoulder Instructions      Home Living                                          Prior Functioning/Environment                   OT Problem List:        OT Treatment/Interventions:      OT Goals(Current goals can be found in the care plan section) Acute Rehab OT Goals Patient Stated Goal: improve breathing OT Goal Formulation: With patient Time For Goal Achievement: 07/12/19 Potential to Achieve Goals: Good ADL Goals Pt Will Perform Grooming: with min guard assist;standing Pt Will Perform Lower Body Bathing: with min assist;sit to/from stand;sitting/lateral leans Pt Will Perform Lower Body Dressing: with min assist;sitting/lateral leans;sit to/from stand Pt Will Transfer to Toilet: with min assist;ambulating;bedside commode Additional ADL Goal #1: Pt will complete multi step BADL task with <3 safety cues for successful completion  OT Frequency: Min 2X/week   Barriers to D/C:            Co-evaluation              AM-PAC OT "6 Clicks" Daily Activity     Outcome Measure Help from another person eating meals?: None Help from another person taking care of personal grooming?: A Little Help from another person toileting, which includes using toliet, bedpan, or urinal?: A Lot Help from another person bathing (including washing, rinsing, drying)?: A Lot Help from another person to put on and taking off regular upper body clothing?: A Little Help from another person to put on and taking off regular lower body clothing?: A Lot 6 Click Score: 16   End of Session Equipment Utilized During Treatment: Gait belt;Rolling walker Nurse Communication: Mobility status  Activity Tolerance: Patient tolerated  treatment well Patient left: in bed;with call bell/phone within reach;with bed alarm set  OT Visit Diagnosis: Unsteadiness on feet (R26.81);Other abnormalities of gait and mobility (R26.89);Muscle weakness (generalized) (M62.81);Other symptoms and signs involving cognitive function                Time: 1610-9604 OT Time Calculation (min): 65 min Charges:  OT General Charges $OT Visit: 1 Visit OT Treatments $Self Care/Home Management : 38-52 mins $Therapeutic Activity: 8-22 mins  Jefferey Pica, OTR/L Acute Rehabilitation Services Pager: [phone] Office: [phone]   Yeiden Frenkel C 07/03/2019, 5:44 PM

## 2019-07-03 NOTE — TOC Progression Note (Signed)
Transition of Care [facility]) - Progression Note    Patient Details  Name: Noah [name] MRN: [id number] Date of Birth: 1940/04/20  Transition of Care [facility]) CM/SW Contact  Gildardo Griffes, [place] Phone Number: 07/03/2019, 1:35 PM  Clinical Narrative:     CSW spoke with patient's daughter [name] regarding bed offers, she would like to move forward with [facility] in [place].   CSW called Thayer Ohm with admissions for [facility] he reports they will initiate [employer].   CSW requested updated covid test be ordered by MD.   Expected Discharge Plan: Skilled Nursing Facility Barriers to Discharge: Continued Medical Work up  Expected Discharge Plan and Services Expected Discharge Plan: Skilled Nursing Facility     Post Acute Care Choice: Skilled Nursing Facility Living arrangements for the past 2 months: Single Family Home                                       Social Determinants of Health (SDOH) Interventions    Readmission Risk Interventions Readmission Risk Prevention Plan 04/25/2019 04/17/2019  Transportation Screening Complete Complete  PCP or Specialist Appt within 3-5 Days - Not Complete  HRI or Home Care Consult - Complete  Social Work Consult for Recovery Care Planning/Counseling - Complete  Palliative Care Screening - Not Complete  Medication Review Oceanographer) Complete Complete  PCP or Specialist appointment within 3-5 days of discharge Not Complete -  PCP/Specialist Appt Not Complete comments patient discharging to SNF -  HRI or Home Care Consult Not Complete -  SW Recovery Care/Counseling Consult Complete -  Palliative Care Screening Not Applicable -  Skilled Nursing Facility Complete -  Some recent data might be hidden

## 2019-07-03 NOTE — Progress Notes (Signed)
ANTICOAGULATION CONSULT NOTE  Pharmacy Consult for Coumadin Indication: atrial fibrillation  Allergies  Allergen Reactions  . Codeine Other (See Comments)    constipation  . Erythromycin-Sulfisoxazole Other (See Comments)    Causes infection in throat and eyes    Patient Measurements: Height: 5\' 8"  (172.7 cm) Weight: 274 lb 11.1 oz (124.6 kg) IBW/kg (Calculated) : 68.4  Vital Signs: Temp: 97.6 F (36.4 C) (03/16 0800) Temp Source: Oral (03/16 0800) BP: 147/67 (03/16 0800) Pulse Rate: 81 (03/16 0800)  Labs: Recent Labs    07/01/19 0511 07/01/19 0511 07/02/19 0436 07/03/19 0530  HGB 8.8*   < > 7.9* 8.9*  HCT 30.3*  --  27.2* 29.3*  PLT 117*  --  122* 117*  LABPROT 30.6*  --  33.0* 33.2*  INR 2.9*  --  3.2* 3.3*  CREATININE 1.21  --  1.20 1.21   < > = values in this interval not displayed.    Estimated Creatinine Clearance: 64.7 mL/min (by C-G formula based on SCr of 1.21 mg/dL).   Medical History: Past Medical History:  Diagnosis Date  . CAD S/P percutaneous coronary angioplasty    remote PCIs in 1990's- last CFX PCI 2008  . Chronic kidney disease, stage III (moderate)   . Diabetes mellitus with complication (HCC)    with hyperglycemia and diabetic autonomic poly neuropathy  . Gastro-esophageal reflux disease with esophagitis   . Hyperlipidemia   . Hypertension   . Ischemic cardiomyopathy 01/20/2018   St Jude ICD   . Morbid obesity (HCC)   . Obstructive sleep apnea    on CPAP  . Polyneuropathy   . Primary insomnia     Assessment: 79yo male presented to [facility] ED c/o SOB, concern for pulmonary edema, tx'd to [facility] for further evaluation, to continue Coumadin for Afib. Admission INR low at 1.2.  INR today is supratherapeutic at 3.3, continuing to trend up (dose Depaoli on 3/15). Hgb up slightly to 8.9, plt 117. No s/sx of bleeding documented in chart. Minimal oral intake documented (10% once yesterday).  *PTA dose = 2.5 daily per recent anti-coag visit on  2/25.  Goal of Therapy:  INR 2-3   Plan:  Will hold warfarin tonight to curve INR trend Daily INR, check CBC in am  3/25, PharmD, BCCCP Clinical Pharmacist  Phone: [phone]  Please check AMION for all [facility] Pharmacy phone numbers After 10:00 PM, call Main Pharmacy [phone] 07/03/2019  10:24 AM

## 2019-07-04 DIAGNOSIS — R5381 Other malaise: Secondary | ICD-10-CM

## 2019-07-04 DIAGNOSIS — E1165 Type 2 diabetes mellitus with hyperglycemia: Secondary | ICD-10-CM

## 2019-07-04 LAB — CBC WITH DIFFERENTIAL/PLATELET
Abs Immature Granulocytes: 0.02 10*3/uL (ref 0.00–0.07)
Basophils Absolute: 0.1 10*3/uL (ref 0.0–0.1)
Basophils Relative: 1 %
Eosinophils Absolute: 0.4 10*3/uL (ref 0.0–0.5)
Eosinophils Relative: 5 %
HCT: 29.2 % — ABNORMAL LOW (ref 39.0–52.0)
Hemoglobin: 8.9 g/dL — ABNORMAL LOW (ref 13.0–17.0)
Immature Granulocytes: 0 %
Lymphocytes Relative: 16 %
Lymphs Abs: 1.3 10*3/uL (ref 0.7–4.0)
MCH: 28 pg (ref 26.0–34.0)
MCHC: 30.5 g/dL (ref 30.0–36.0)
MCV: 91.8 fL (ref 80.0–100.0)
Monocytes Absolute: 0.6 10*3/uL (ref 0.1–1.0)
Monocytes Relative: 7 %
Neutro Abs: 5.7 10*3/uL (ref 1.7–7.7)
Neutrophils Relative %: 71 %
Platelets: 126 10*3/uL — ABNORMAL LOW (ref 150–400)
RBC: 3.18 MIL/uL — ABNORMAL LOW (ref 4.22–5.81)
RDW: 13.1 % (ref 11.5–15.5)
WBC: 8.2 10*3/uL (ref 4.0–10.5)
nRBC: 0 % (ref 0.0–0.2)

## 2019-07-04 LAB — BASIC METABOLIC PANEL
Anion gap: 9 (ref 5–15)
BUN: 22 mg/dL (ref 8–23)
CO2: 33 mmol/L — ABNORMAL HIGH (ref 22–32)
Calcium: 8.5 mg/dL — ABNORMAL LOW (ref 8.9–10.3)
Chloride: 100 mmol/L (ref 98–111)
Creatinine, Ser: 1.11 mg/dL (ref 0.61–1.24)
GFR calc Af Amer: >60 mL/min (ref >60)
GFR calc non Af Amer: >60 mL/min (ref >60)
Glucose, Bld: 118 mg/dL — ABNORMAL HIGH (ref 70–99)
Potassium: 3.6 mmol/L (ref 3.5–5.1)
Sodium: 142 mmol/L (ref 135–145)

## 2019-07-04 LAB — GLUCOSE, CAPILLARY
Glucose-Capillary: 113 mg/dL — ABNORMAL HIGH (ref 70–99)
Glucose-Capillary: 175 mg/dL — ABNORMAL HIGH (ref 70–99)
Glucose-Capillary: 177 mg/dL — ABNORMAL HIGH (ref 70–99)
Glucose-Capillary: 198 mg/dL — ABNORMAL HIGH (ref 70–99)
Glucose-Capillary: 206 mg/dL — ABNORMAL HIGH (ref 70–99)
Glucose-Capillary: 207 mg/dL — ABNORMAL HIGH (ref 70–99)

## 2019-07-04 LAB — PROTIME-INR
INR: 2.7 — ABNORMAL HIGH (ref 0.8–1.2)
Prothrombin Time: 28.6 seconds — ABNORMAL HIGH (ref 11.4–15.2)

## 2019-07-04 LAB — MAGNESIUM: Magnesium: 1.8 mg/dL (ref 1.7–2.4)

## 2019-07-04 LAB — BRAIN NATRIURETIC PEPTIDE: B Natriuretic Peptide: 553.2 pg/mL — ABNORMAL HIGH (ref 0.0–100.0)

## 2019-07-04 MED ORDER — ATORVASTATIN CALCIUM 10 MG PO TABS
20.0000 mg | ORAL_TABLET | Freq: Every day | ORAL | Status: DC
  Administered 2019-07-04 – 2019-07-05 (×2): 20 mg via ORAL
  Filled 2019-07-04 (×2): qty 2

## 2019-07-04 MED ORDER — INSULIN DETEMIR 100 UNIT/ML ~~LOC~~ SOLN
5.0000 [IU] | Freq: Every day | SUBCUTANEOUS | Status: DC
  Administered 2019-07-04 – 2019-07-05 (×2): 5 [IU] via SUBCUTANEOUS
  Filled 2019-07-04 (×3): qty 0.05

## 2019-07-04 MED ORDER — SACUBITRIL-VALSARTAN 97-103 MG PO TABS
1.0000 | ORAL_TABLET | Freq: Two times a day (BID) | ORAL | Status: DC
  Administered 2019-07-04 – 2019-07-06 (×4): 1 via ORAL
  Filled 2019-07-04 (×5): qty 1

## 2019-07-04 MED ORDER — INSULIN ASPART 100 UNIT/ML ~~LOC~~ SOLN
6.0000 [IU] | Freq: Three times a day (TID) | SUBCUTANEOUS | Status: DC
  Administered 2019-07-04 – 2019-07-06 (×6): 6 [IU] via SUBCUTANEOUS

## 2019-07-04 MED ORDER — INSULIN ASPART 100 UNIT/ML ~~LOC~~ SOLN: 0.0000 [IU] | Freq: Every day | SUBCUTANEOUS | Status: DC

## 2019-07-04 MED ORDER — INSULIN ASPART 100 UNIT/ML ~~LOC~~ SOLN
0.0000 [IU] | Freq: Three times a day (TID) | SUBCUTANEOUS | Status: DC
  Administered 2019-07-04: 5 [IU] via SUBCUTANEOUS
  Administered 2019-07-05 (×2): 3 [IU] via SUBCUTANEOUS
  Administered 2019-07-05: 2 [IU] via SUBCUTANEOUS
  Administered 2019-07-06 (×2): 3 [IU] via SUBCUTANEOUS

## 2019-07-04 MED ORDER — WARFARIN SODIUM 2 MG PO TABS
2.0000 mg | ORAL_TABLET | Freq: Once | ORAL | Status: AC
  Administered 2019-07-04: 2 mg via ORAL
  Filled 2019-07-04: qty 1

## 2019-07-04 NOTE — Plan of Care (Signed)

## 2019-07-04 NOTE — Progress Notes (Signed)
PROGRESS NOTE  Noah [name] RXV:[id number] DOB: 22-Jan-1941   PCP: Manon Hilding, MD  Patient is from: Home  DOA: 06/23/2019 LOS: 64  Brief Narrative / Interim history: 79 y.o.malewith past medical history of CAD s/p PCI in 2008, ischemic cardiomyopathy, combined chronic systolic and diastolic heart failure, s/p BiV ICD, A. fib on anticoagulation, DM, HTN, GERD, HLD, OSA-3 L nocturnally, CKD stage IIIa who presented to [facility] on 2/6 with hypothermia and hypotension. Chest x-ray showed pulmonary edema, patient was started on Levophed infusion and transferred to [facility] course complicated by worsening somnolence, hypercarbia-required intubation on 3/8, he was transferred to [facility] care on 07/01/2019 on day 7 of his hospital stay.  Subjective: No major events overnight or this morning.  No complaints.  Denies chest pain, shortness of breath,,, GI or UTI symptoms.  Saturating well on 3 L by nasal cannula.  Objective: Vitals:   07/03/19 2356 07/04/19 0438 07/04/19 0748 07/04/19 1130  BP: 139/62 (!) 145/71  (!) 154/65  Pulse: 60 62  61  Resp: 16 17  13   Temp: (!) 97 F (36.1 C) (!) 97.5 F (36.4 C) 97.6 F (36.4 C) (!) 97.4 F (36.3 C)  TempSrc: Axillary Oral Oral Oral  SpO2: 98% 93% 96% 97%  Weight:      Height:        Intake/Output Summary (Last 24 hours) at 07/04/2019 1326 Last data filed at 07/04/2019 0908 Gross per 24 hour  Intake 247 ml  Output 2125 ml  Net -1878 ml   Filed Weights   06/27/19 0409 06/28/19 0500 06/29/19 0515  Weight: 132.9 kg 131.8 kg 124.6 kg    Examination:  GENERAL: No acute distress.  Appears well.  HEENT: MMM.  Vision and hearing grossly intact.  NECK: Supple.  Difficult to assess JVD due to body habitus. RESP: On 3 L by [place].  No IWOB.  Fair aeration bilaterally. CVS:  RRR. Heart sounds normal.  ABD/GI/GU: Bowel sounds present. Soft. Non tender.  MSK/EXT:  Moves extremities. No apparent deformity.  BLE trace edema. SKIN: no  apparent skin lesion or wound NEURO: Awake, alert and oriented appropriately.  Generalized BLE weakness PSYCH: Calm. Normal affect.   Procedures:  ETT 3/8 >>3/10 R Radial ALine 3/7 >> 3/9  Assessment & Plan: Septic shock due to Enterococcus UTI: Was concern about concurrent adrenal insufficiency  but BP stable off steroids.  Completed antibiotic course for UTI.   Enterococcus UTI: Completed appropriate antibiotic course  Acute metabolic encephalopathy:Multifactorial including sepsis due to UTI, hypercarbia, metabolic acidosis and acute illness.  CT head without acute finding.  Encephalopathy seems to have resolved. -Treat treatable causes  Acute on chronic respiratory failure with hypoxia and hypercarbia: marked CO2 on ABG.  Has history of OSA.  Not on CPAP but uses 3L at night.  -Encourage nightly BiPAP -Wean oxygen to room air during the day.  Currently 3 L at night if he refuses BiPAP.  Morbid Obesity with OSA, OHS: Body mass index is 41.77 kg/m. -Oxygen and BiPAP as above -Encourage lifestyle change to lose weight  Acute on chronic combined CHF (EF 35-40% by TTE on 3/8)/ICM s/p CRT-D:  Fluid status appears to have improved although exam is difficult due to body habitus.  He has trace BLE edema.  Respiratory status seems to be improving.  Per cardiology, device interrogation with significant burden of arrhythmia.  He had 2.6 L UOP in the last 24 hours.  Renal function and electrolytes stable.  BNP improving. -Cardiology following. -Continue p.o. Lasix 40 mg twice daily -GDMT-on metoprolol and Entresto per cardiology -Monitor fluid status, renal function and electrolytes  CAD s/p PCI in 2008: No chest pain.  High-sensitivity troponin negative. -Cardiac meds as above -Add atorvastatin.  Chronic atrial fibrillation: Rate controlled on metoprolol.  CHA2DS2-VASc score >3:  -Continue metoprolol warfarin-INR therapeutic.  AKI on CKD stage IIIa:Likely due to hypotension.    Renal ultrasound reassuring.  AKI resolved. -Continue monitoring  Hypernatremia: Resolved.  Hypomagnesemia: Resolved -Monitor and replenish as appropriate  Dysphagia likely in the setting of intubation -Cleared by speech for regular diet.  Deconditioning/debility:Secondary to acute illness-suspect some amount of frailty at baseline. Evaluated by rehab services-plans are for SNF on discharge  Uncontrolled DM-2 with hyperglycemia: A1c 8.3% on 06/25/2019. Recent Labs    07/04/19 0438 07/04/19 0853 07/04/19 1130  GLUCAP 113* 177* 206*  -Adjusted insulin -Add atorvastatin        Nutrition Problem: Increased nutrient needs Etiology: acute illness  Signs/Symptoms: estimated needs  Interventions: Tube feeding, Prostat   DVT prophylaxis: On warfarin for A. fib Code Status: Full code Family Communication: Patient and/or RN. Available if any question.  Discharge barrier: Acute respiratory failure, CHF exacerbation and safe disposition (SNF).  Cardiology titrating cardiac medications.  Patient is from: Home Final disposition: SNF in the next 24 to 48 hours once cleared by cardiology  Consultants: PCCM (off), cardiology   Microbiology summarized: 3/6influenza/Covid: Negative 3/6 blood cultures: Negative 3/6 urine culture: Enterococcus faecalis 3/7 MRSA PCR negative  Sch Meds:  Scheduled Meds: . atorvastatin  20 mg Oral q1800  . Chlorhexidine Gluconate Cloth  6 each Topical Daily  . FLUoxetine  20 mg Oral QPM  . furosemide  40 mg Oral BID  . insulin aspart  0-15 Units Subcutaneous TID WC  . insulin aspart  0-5 Units Subcutaneous QHS  . insulin aspart  6 Units Subcutaneous TID WC  . insulin detemir  5 Units Subcutaneous QHS  . levETIRAcetam  750 mg Oral BID  . mouth rinse  15 mL Mouth Rinse BID  . Melatonin  3 mg Oral QHS  . metoprolol tartrate  25 mg Oral BID  . pantoprazole  40 mg Oral Daily  . QUEtiapine  25 mg Oral QHS  . sacubitril-valsartan  1 tablet  Oral BID  . sodium chloride flush  10-40 mL Intracatheter Q12H  . tamsulosin  0.8 mg Oral QPM  . Warfarin - Pharmacist Dosing Inpatient   Does not apply q1800   Continuous Infusions: PRN Meds:.acetaminophen, Resource ThickenUp Clear  Antimicrobials: Anti-infectives (From admission, onward)   Start     Dose/Rate Route Frequency Ordered Stop   06/26/19 1800  vancomycin (VANCOREADY) IVPB 1500 mg/300 mL  Status:  Discontinued     1,500 mg 150 mL/hr over 120 Minutes Intravenous Every 48 hours 06/25/19 1002 06/25/19 1431   06/26/19 1000  Ampicillin-Sulbactam (UNASYN) 3 g in sodium chloride 0.9 % 100 mL IVPB  Status:  Discontinued     3 g 200 mL/hr over 30 Minutes Intravenous Every 6 hours 06/26/19 0919 06/27/19 1337   06/24/19 2200  ceFEPIme (MAXIPIME) 2 g in sodium chloride 0.9 % 100 mL IVPB  Status:  Discontinued     2 g 200 mL/hr over 30 Minutes Intravenous Every 12 hours 06/24/19 1134 06/26/19 0851   06/24/19 1800  vancomycin (VANCOREADY) IVPB 1500 mg/300 mL  Status:  Discontinued     1,500 mg 150 mL/hr over 120 Minutes Intravenous Every 24 hours  06/23/19 1759 06/25/19 1002   06/24/19 0100  ceFEPIme (MAXIPIME) 2 g in sodium chloride 0.9 % 100 mL IVPB  Status:  Discontinued     2 g 200 mL/hr over 30 Minutes Intravenous Every 8 hours 06/23/19 1759 06/24/19 1134   06/23/19 1800  vancomycin (VANCOREADY) IVPB 2000 mg/400 mL     2,000 mg 200 mL/hr over 120 Minutes Intravenous  Once 06/23/19 1759 06/23/19 2056   06/23/19 1700  ceFEPIme (MAXIPIME) 2 g in sodium chloride 0.9 % 100 mL IVPB     2 g 200 mL/hr over 30 Minutes Intravenous  Once 06/23/19 1655 06/23/19 1741   06/23/19 1700  metroNIDAZOLE (FLAGYL) IVPB 500 mg     500 mg 100 mL/hr over 60 Minutes Intravenous  Once 06/23/19 1655 06/23/19 1849   06/23/19 1700  vancomycin (VANCOCIN) IVPB 1000 mg/200 mL premix  Status:  Discontinued     1,000 mg 200 mL/hr over 60 Minutes Intravenous  Once 06/23/19 1655 06/23/19 1759       I have  personally reviewed the following labs and images: CBC: Recent Labs  Lab 06/30/19 0607 07/01/19 0511 07/02/19 0436 07/03/19 0530 07/04/19 0550  WBC 7.7 8.1 8.5 8.7 8.2  NEUTROABS  --   --  5.6 5.9 5.7  HGB 8.1* 8.8* 7.9* 8.9* 8.9*  HCT 28.1* 30.3* 27.2* 29.3* 29.2*  MCV 96.2 95.0 94.4 93.0 91.8  PLT 102* 117* 122* 117* 126*   BMP &GFR Recent Labs  Lab 06/30/19 0607 07/01/19 0511 07/02/19 0436 07/03/19 0530 07/04/19 0550  NA 142 151* 151* 148* 142  K 4.3 4.2 4.1 3.5 3.6  CL 105 109 109 106 100  CO2 29 32 32 33* 33*  GLUCOSE 340* 105* 99 108* 118*  BUN 55* 49* 36* 32* 22  CREATININE 1.34* 1.21 1.20 1.21 1.11  CALCIUM 8.7* 9.0 9.0 9.0 8.5*  MG  --  1.9 1.8 1.5* 1.8   Estimated Creatinine Clearance: 70.5 mL/min (by C-G formula based on SCr of 1.11 mg/dL). Liver & Pancreas: Recent Labs  Lab 07/01/19 0511  AST 15  ALT 19  ALKPHOS 83  BILITOT 0.9  PROT 5.6*  ALBUMIN 2.4*   No results for input(s): LIPASE, AMYLASE in the last 168 hours. No results for input(s): AMMONIA in the last 168 hours. Diabetic: No results for input(s): HGBA1C in the last 72 hours. Recent Labs  Lab 07/03/19 1923 07/03/19 2356 07/04/19 0438 07/04/19 0853 07/04/19 1130  GLUCAP 198* 198* 113* 177* 206*   Cardiac Enzymes: No results for input(s): CKTOTAL, CKMB, CKMBINDEX, TROPONINI in the last 168 hours. No results for input(s): PROBNP in the last 8760 hours. Coagulation Profile: Recent Labs  Lab 06/30/19 0607 07/01/19 0511 07/02/19 0436 07/03/19 0530 07/04/19 0550  INR 2.7* 2.9* 3.2* 3.3* 2.7*   Thyroid Function Tests: No results for input(s): TSH, T4TOTAL, FREET4, T3FREE, THYROIDAB in the last 72 hours. Lipid Profile: No results for input(s): CHOL, HDL, LDLCALC, TRIG, CHOLHDL, LDLDIRECT in the last 72 hours. Anemia Panel: No results for input(s): VITAMINB12, FOLATE, FERRITIN, TIBC, IRON, RETICCTPCT in the last 72 hours. Urine analysis:    Component Value Date/Time    COLORURINE YELLOW 06/23/2019 2030   APPEARANCEUR CLEAR 06/23/2019 2030   LABSPEC 1.015 06/23/2019 2030   PHURINE 5.0 06/23/2019 2030   GLUCOSEU NEGATIVE 06/23/2019 2030   HGBUR NEGATIVE 06/23/2019 2030   BILIRUBINUR NEGATIVE 06/23/2019 2030   KETONESUR NEGATIVE 06/23/2019 2030   PROTEINUR 30 (A) 06/23/2019 2030   NITRITE NEGATIVE 06/23/2019  2030   LEUKOCYTESUR NEGATIVE 06/23/2019 2030   Sepsis Labs: Invalid input(s): PROCALCITONIN, LACTICIDVEN  Microbiology: No results found for this or any previous visit (from the past 240 hour(s)).  Radiology Studies: No results found.    Aydee Mcnew T. Mariette Cowley [facility]  If 7PM-7AM, please contact night-coverage [email or web address] Password [facility] 07/04/2019, 1:26 PM

## 2019-07-04 NOTE — Progress Notes (Signed)
ANTICOAGULATION CONSULT NOTE  Pharmacy Consult for Coumadin Indication: atrial fibrillation  Allergies  Allergen Reactions  . Codeine Other (See Comments)    constipation  . Erythromycin-Sulfisoxazole Other (See Comments)    Causes infection in throat and eyes    Patient Measurements: Height: 5\' 8"  (172.7 cm) Weight: 274 lb 11.1 oz (124.6 kg) IBW/kg (Calculated) : 68.4  Vital Signs: Temp: 97.4 F (36.3 C) (03/17 1130) Temp Source: Oral (03/17 1130) BP: 154/65 (03/17 1130) Pulse Rate: 61 (03/17 1130)  Labs: Recent Labs    07/02/19 0436 07/02/19 0436 07/03/19 0530 07/04/19 0550  HGB 7.9*   < > 8.9* 8.9*  HCT 27.2*  --  29.3* 29.2*  PLT 122*  --  117* 126*  LABPROT 33.0*  --  33.2* 28.6*  INR 3.2*  --  3.3* 2.7*  CREATININE 1.20  --  1.21 1.11   < > = values in this interval not displayed.    Estimated Creatinine Clearance: 70.5 mL/min (by C-G formula based on SCr of 1.11 mg/dL).   Medical History: Past Medical History:  Diagnosis Date  . CAD S/P percutaneous coronary angioplasty    remote PCIs in 1990's- last CFX PCI 2008  . Chronic kidney disease, stage III (moderate)   . Diabetes mellitus with complication (HCC)    with hyperglycemia and diabetic autonomic poly neuropathy  . Gastro-esophageal reflux disease with esophagitis   . Hyperlipidemia   . Hypertension   . Ischemic cardiomyopathy 01/20/2018   St Jude ICD   . Morbid obesity (HCC)   . Obstructive sleep apnea    on CPAP  . Polyneuropathy   . Primary insomnia     Assessment: 79yo male presented to [facility] ED c/o SOB, concern for pulmonary edema, tx'd to [facility] for further evaluation, to continue Coumadin for Afib. Admission INR low at 1.2.  INR today is therapeutic at 2.7 (dose Spaid on 3/15 and 3/16). Hgb up slightly to 8.9, plt 126. No s/sx of bleeding documented in chart. Minimal oral intake documented.  *PTA dose = 2.5 daily per recent anti-coag visit on 2/25.  Goal of Therapy:  INR 2-3    Plan:  Warfarin 2 mg tonight  Daily INR, check CBC in am  3/25, PharmD, BCCCP Clinical Pharmacist  Phone: [phone]  Please check AMION for all [facility] Pharmacy phone numbers After 10:00 PM, call Main Pharmacy [phone] 07/04/2019  1:36 PM

## 2019-07-04 NOTE — Progress Notes (Signed)
Patient refusing BiPAP at this time.

## 2019-07-04 NOTE — Progress Notes (Signed)
Progress Note  Patient Name: Noah [name] Date of Encounter: 07/04/2019  Primary Cardiologist: Kate Sable, MD   Subjective   Looks and feels better every day.  Walked with physical therapy in the hallway today.  States he generalized how weak he had become, but was able to do it.  No cardiovascular complaints. Sodium and BUN are now in normal range.  Inpatient Medications    Scheduled Meds: . atorvastatin  20 mg Oral q1800  . Chlorhexidine Gluconate Cloth  6 each Topical Daily  . FLUoxetine  20 mg Oral QPM  . furosemide  40 mg Oral BID  . insulin aspart  0-15 Units Subcutaneous TID WC  . insulin aspart  0-5 Units Subcutaneous QHS  . insulin aspart  6 Units Subcutaneous TID WC  . insulin detemir  5 Units Subcutaneous QHS  . levETIRAcetam  750 mg Oral BID  . mouth rinse  15 mL Mouth Rinse BID  . Melatonin  3 mg Oral QHS  . metoprolol tartrate  25 mg Oral BID  . pantoprazole  40 mg Oral Daily  . QUEtiapine  25 mg Oral QHS  . sacubitril-valsartan  1 tablet Oral BID  . sodium chloride flush  10-40 mL Intracatheter Q12H  . tamsulosin  0.8 mg Oral QPM  . warfarin  2 mg Oral ONCE-1800  . Warfarin - Pharmacist Dosing Inpatient   Does not apply q1800   Continuous Infusions:  PRN Meds: acetaminophen, Resource ThickenUp Clear   Vital Signs    Vitals:   07/03/19 2356 07/04/19 0438 07/04/19 0748 07/04/19 1130  BP: 139/62 (!) 145/71  (!) 154/65  Pulse: 60 62  61  Resp: 16 17  13   Temp: (!) 97 F (36.1 C) (!) 97.5 F (36.4 C) 97.6 F (36.4 C) (!) 97.4 F (36.3 C)  TempSrc: Axillary Oral Oral Oral  SpO2: 98% 93% 96% 97%  Weight:      Height:        Intake/Output Summary (Last 24 hours) at 07/04/2019 1419 Last data filed at 07/04/2019 0908 Gross per 24 hour  Intake 247 ml  Output 2125 ml  Net -1878 ml   Last 3 Weights 06/29/2019 06/28/2019 06/27/2019  Weight (lbs) 274 lb 11.1 oz 290 lb 9.1 oz 292 lb 15.9 oz  Weight (kg) 124.6 kg 131.8 kg 132.9 kg       Telemetry    Mostly sinus rhythm with biventricular pacing, occasional atrial tachycardia- Personally Reviewed  ECG    No new tracing- Personally Reviewed  Physical Exam  Morbidly obese.  Sitting up in chair smiling. GEN: No acute distress.   Neck: No JVD Cardiac: RRR, no murmurs, rubs, or gallops.  Respiratory: Clear to auscultation bilaterally. GI: Soft, nontender, non-distended  MS: No edema; No deformity. Neuro:  Nonfocal  Psych: Normal affect   Labs    High Sensitivity Troponin:   Recent Labs  Lab 06/23/19 1903 06/23/19 2125  TROPONINIHS 6 6      Chemistry Recent Labs  Lab 07/01/19 0511 07/01/19 0511 07/02/19 0436 07/03/19 0530 07/04/19 0550  NA 151*   < > 151* 148* 142  K 4.2   < > 4.1 3.5 3.6  CL 109   < > 109 106 100  CO2 32   < > 32 33* 33*  GLUCOSE 105*   < > 99 108* 118*  BUN 49*   < > 36* 32* 22  CREATININE 1.21   < > 1.20 1.21 1.11  CALCIUM 9.0   < >  9.0 9.0 8.5*  PROT 5.6*  --   --   --   --   ALBUMIN 2.4*  --   --   --   --   AST 15  --   --   --   --   ALT 19  --   --   --   --   ALKPHOS 83  --   --   --   --   BILITOT 0.9  --   --   --   --   GFRNONAA 57*   < > 58* 57* >60  GFRAA >60   < > >60 >60 >60  ANIONGAP 10   < > 10 9 9    < > = values in this interval not displayed.     Hematology Recent Labs  Lab 07/02/19 0436 07/03/19 0530 07/04/19 0550  WBC 8.5 8.7 8.2  RBC 2.88* 3.15* 3.18*  HGB 7.9* 8.9* 8.9*  HCT 27.2* 29.3* 29.2*  MCV 94.4 93.0 91.8  MCH 27.4 28.3 28.0  MCHC 29.0* 30.4 30.5  RDW 13.4 13.3 13.1  PLT 122* 117* 126*    BNP Recent Labs  Lab 07/02/19 0436 07/03/19 0530 07/04/19 0550  BNP 1,000.5* 824.4* 553.2*     DDimer No results for input(s): DDIMER in the last 168 hours.   Radiology    No results found.  Cardiac Studies   ECHO 06/25/2019 1. Technically difficult; definity used; inferoseptal, inferolateral and  apical hypokinesis; overall moderate LV dysfunction; mild MR.  2. Left  ventricular ejection fraction, by estimation, is 35 to 40%. The  left ventricle has moderately decreased function. The left ventricle  demonstrates regional wall motion abnormalities (see scoring  diagram/findings for description). The left  ventricular internal cavity size was mildly dilated. Left ventricular  diastolic parameters are indeterminate.  3. Right ventricular systolic function is normal. The right ventricular  size is normal. There is moderately elevated pulmonary artery systolic  pressure.  4. The mitral valve is normal in structure. Mild mitral valve  regurgitation. No evidence of mitral stenosis.  5. The aortic valve is tricuspid. Aortic valve regurgitation is not  visualized. Mild aortic valve sclerosis is present, with no evidence of  aortic valve stenosis.  6. The inferior vena cava is dilated in size with <50% respiratory  variability, suggesting right atrial pressure of 15 mmHg.   Patient Profile     79 y.o. male with a hx of CAD (s/p prior PCI to LAD and RCA in 1990's with most recent being DES to LCx in 2008, low-risk NST in 09/2017), chronic combined systolic and diastolic CHF/Ischemic Cardiomyopathy (EF 40-45% by echo in 09/2017,35-40% on current admission),s/p BiV ICD placement, paroxysmal atrial fibrillationon chronic anticoagulation with Coumadin,CKD stage III,HTN, HLD and OSA,admitted on 06/24/2019 with severe hypotension and hypothermia, possible sepsis, developing pulmonary edema following volume resuscitation  Assessment & Plan    1. CHF:Not weighed today.  Hard to say with morbid obesity, but probably euvolemic. Denies orthopnea. Switchedto oral diuretics.  Increase Entresto to her usual dose at home. 2. AFib: Therapeutic INR.  Seems to be back to his usual burden of arrhythmia (around 10% mode switch). 3. CRT-D:Normal device function.  During his period of acute illness, there was transient marked reduction in CRT due to frequent atrial  arrhythmia.  This has improved 4. 08/24/2019 angina 5.Hypernatremia: Resolved 6. OSA:  Using CPAP 7.  Deconditioning: Planning to go to the [facility] in [place] after discharge.  He is  ready to go from a cardiology point of view.     For questions or updates, please contact [facility] Please consult [email or web address] for contact info under        Signed, Thurmon Fair, MD  07/04/2019, 2:19 PM

## 2019-07-04 NOTE — Progress Notes (Signed)
Physical Therapy Treatment Patient Details Name: Noah [name] MRN: [id number] DOB: 04-24-1940 Today's Date: 07/04/2019    History of Present Illness 79 y/o M who presented to [facility] on 3/6 with reports of SOB, found to by hypotensive and hypothermic, treated for sepsis. Pt required intubation on 3/8 due to letahrgy and hypercarbia. Extubated on 3/10.    PT Comments    Patient is making progress toward PT goals and tolerated increased mobility well. Pt requires min A for functional transfer/gait training and able to ambulate 40 ft with RW and 3L O2 via [place]. Continue to progress as tolerated with anticipated d/c to SNF for further skilled PT services.     Follow Up Recommendations  SNF;Supervision/Assistance - 24 hour     Equipment Recommendations  (defer to post-acute setting)    Recommendations for Other Services       Precautions / Restrictions Precautions Precautions: Fall Restrictions Weight Bearing Restrictions: No    Mobility  Bed Mobility               General bed mobility comments: pt sitting EOB with nursing staff present upon arrival  Transfers Overall transfer level: Needs assistance Equipment used: Rolling walker (2 wheeled) Transfers: Sit to/from Stand Sit to Stand: Min assist         General transfer comment: assist to power up and to steady upon standing; cues for safe hand placement  Ambulation/Gait Ambulation/Gait assistance: Min assist Gait Distance (Feet): 40 Feet Assistive device: Rolling walker (2 wheeled) Gait Pattern/deviations: Step-through pattern;Decreased step length - right;Decreased step length - left Gait velocity: decreased   General Gait Details: cues for increased bilat step lengths; 2 standing rest breaks    Stairs             Wheelchair Mobility    Modified Rankin (Stroke Patients Only)       Balance Overall balance assessment: Needs assistance Sitting-balance support: Feet supported Sitting balance-Leahy  Scale: Good     Standing balance support: Bilateral upper extremity supported Standing balance-Leahy Scale: Poor                              Cognition Arousal/Alertness: Awake/alert Behavior During Therapy: WFL for tasks assessed/performed Overall Cognitive Status: Within Functional Limits for tasks assessed                                        Exercises      General Comments General comments (skin integrity, edema, etc.): total A for pericare in standing       Pertinent Vitals/Pain Pain Assessment: No/denies pain    Home Living                      Prior Function            PT Goals (current goals can now be found in the care plan section) Progress towards PT goals: Progressing toward goals    Frequency    Min 2X/week      PT Plan Current plan remains appropriate    Co-evaluation              AM-PAC PT "6 Clicks" Mobility   Outcome Measure  Help needed turning from your back to your side while in a flat bed without using bedrails?: A Lot Help needed moving from lying  on your back to sitting on the side of a flat bed without using bedrails?: A Lot Help needed moving to and from a bed to a chair (including a wheelchair)?: A Little Help needed standing up from a chair using your arms (e.g., wheelchair or bedside chair)?: A Little Help needed to walk in hospital room?: A Little Help needed climbing 3-5 steps with a railing? : Total 6 Click Score: 14    End of Session Equipment Utilized During Treatment: Oxygen;Gait belt Activity Tolerance: Patient tolerated treatment well Patient left: in chair;with call bell/phone within reach Nurse Communication: Mobility status PT Visit Diagnosis: Muscle weakness (generalized) (M62.81)     Time: 5361-4431 PT Time Calculation (min) (ACUTE ONLY): 33 min  Charges:  $Gait Training: 23-37 mins                     Earney Navy, PTA Acute Rehabilitation Services Pager:  [phone] Office: [phone]     Darliss Cheney 07/04/2019, 2:31 PM

## 2019-07-04 NOTE — Progress Notes (Signed)
Patient refused PPV. 

## 2019-07-05 LAB — CBC WITH DIFFERENTIAL/PLATELET
Abs Immature Granulocytes: 0.03 10*3/uL (ref 0.00–0.07)
Basophils Absolute: 0.1 10*3/uL (ref 0.0–0.1)
Basophils Relative: 1 %
Eosinophils Absolute: 0.4 10*3/uL (ref 0.0–0.5)
Eosinophils Relative: 4 %
HCT: 28.7 % — ABNORMAL LOW (ref 39.0–52.0)
Hemoglobin: 8.8 g/dL — ABNORMAL LOW (ref 13.0–17.0)
Immature Granulocytes: 0 %
Lymphocytes Relative: 16 %
Lymphs Abs: 1.5 10*3/uL (ref 0.7–4.0)
MCH: 28 pg (ref 26.0–34.0)
MCHC: 30.7 g/dL (ref 30.0–36.0)
MCV: 91.4 fL (ref 80.0–100.0)
Monocytes Absolute: 0.6 10*3/uL (ref 0.1–1.0)
Monocytes Relative: 6 %
Neutro Abs: 6.6 10*3/uL (ref 1.7–7.7)
Neutrophils Relative %: 73 %
Platelets: 129 10*3/uL — ABNORMAL LOW (ref 150–400)
RBC: 3.14 MIL/uL — ABNORMAL LOW (ref 4.22–5.81)
RDW: 13.2 % (ref 11.5–15.5)
WBC: 9.1 10*3/uL (ref 4.0–10.5)
nRBC: 0 % (ref 0.0–0.2)

## 2019-07-05 LAB — PROTIME-INR
INR: 2.2 — ABNORMAL HIGH (ref 0.8–1.2)
Prothrombin Time: 24.6 seconds — ABNORMAL HIGH (ref 11.4–15.2)

## 2019-07-05 LAB — BASIC METABOLIC PANEL
Anion gap: 7 (ref 5–15)
BUN: 21 mg/dL (ref 8–23)
CO2: 34 mmol/L — ABNORMAL HIGH (ref 22–32)
Calcium: 8.2 mg/dL — ABNORMAL LOW (ref 8.9–10.3)
Chloride: 99 mmol/L (ref 98–111)
Creatinine, Ser: 1.44 mg/dL — ABNORMAL HIGH (ref 0.61–1.24)
GFR calc Af Amer: 54 mL/min — ABNORMAL LOW (ref >60)
GFR calc non Af Amer: 46 mL/min — ABNORMAL LOW (ref >60)
Glucose, Bld: 198 mg/dL — ABNORMAL HIGH (ref 70–99)
Potassium: 3.8 mmol/L (ref 3.5–5.1)
Sodium: 140 mmol/L (ref 135–145)

## 2019-07-05 LAB — RETICULOCYTES
Immature Retic Fract: 14.8 % (ref 2.3–15.9)
RBC.: 3.47 MIL/uL — ABNORMAL LOW (ref 4.22–5.81)
Retic Count, Absolute: 67.3 10*3/uL (ref 19.0–186.0)
Retic Ct Pct: 1.9 % (ref 0.4–3.1)

## 2019-07-05 LAB — GLUCOSE, CAPILLARY
Glucose-Capillary: 164 mg/dL — ABNORMAL HIGH (ref 70–99)
Glucose-Capillary: 143 mg/dL — ABNORMAL HIGH (ref 70–99)
Glucose-Capillary: 179 mg/dL — ABNORMAL HIGH (ref 70–99)
Glucose-Capillary: 186 mg/dL — ABNORMAL HIGH (ref 70–99)

## 2019-07-05 LAB — IRON AND TIBC
Iron: 35 ug/dL — ABNORMAL LOW (ref 45–182)
Saturation Ratios: 13 % — ABNORMAL LOW (ref 17.9–39.5)
TIBC: 266 ug/dL (ref 250–450)
UIBC: 231 ug/dL

## 2019-07-05 LAB — SARS CORONAVIRUS 2 (TAT 6-24 HRS): SARS Coronavirus 2: NEGATIVE

## 2019-07-05 LAB — MAGNESIUM: Magnesium: 1.7 mg/dL (ref 1.7–2.4)

## 2019-07-05 LAB — VITAMIN B12: Vitamin B-12: 483 pg/mL (ref 180–914)

## 2019-07-05 LAB — BRAIN NATRIURETIC PEPTIDE: B Natriuretic Peptide: 412.3 pg/mL — ABNORMAL HIGH (ref 0.0–100.0)

## 2019-07-05 LAB — FERRITIN: Ferritin: 116 ng/mL (ref 24–336)

## 2019-07-05 LAB — FOLATE: Folate: 8.5 ng/mL (ref >5.9)

## 2019-07-05 MED ORDER — WARFARIN SODIUM 2.5 MG PO TABS
2.5000 mg | ORAL_TABLET | Freq: Every day | ORAL | Status: DC
  Administered 2019-07-05: 2.5 mg via ORAL
  Filled 2019-07-05: qty 1

## 2019-07-05 MED ORDER — FUROSEMIDE 40 MG PO TABS
40.0000 mg | ORAL_TABLET | Freq: Every day | ORAL | Status: DC
  Administered 2019-07-06: 40 mg via ORAL
  Filled 2019-07-05: qty 1

## 2019-07-05 NOTE — Progress Notes (Signed)
SATURATION QUALIFICATIONS: (This note is used to comply with regulatory documentation for home oxygen)  Patient Saturations on Room Air at Rest = 99%  Patient Saturations on Room Air while Ambulating = 85%  Patient Saturations on 2 Liters of oxygen while Ambulating = 98%  Please briefly explain why patient needs home oxygen: Pt desaturates quickly without supplemental 2L [place] with household level of functional mobility.   Dalphine Handing, MSOT, OTR/L Acute Rehabilitation Services [facility] Office Number: [phone] Pager: [phone]

## 2019-07-05 NOTE — Plan of Care (Signed)

## 2019-07-05 NOTE — Progress Notes (Signed)
PROGRESS NOTE  Noah [name] QAS:[id number] DOB: 1940/11/25   PCP: Estanislado Pandy, MD  Patient is from: Home  DOA: 06/23/2019 LOS: 11  Brief Narrative / Interim history: 79 y.o.malewith past medical history of CAD s/p PCI in 2008, ischemic cardiomyopathy, combined chronic systolic and diastolic heart failure, s/p BiV ICD, A. fib on anticoagulation, DM, HTN, GERD, HLD, OSA-3 L nocturnally, CKD stage IIIa who presented to [facility] on 2/6 with hypothermia and hypotension. Chest x-ray showed pulmonary edema, patient was started on Levophed infusion and transferred to [facility] course complicated by worsening somnolence, hypercarbia-required intubation on 3/8, he was transferred to [facility] care on 07/01/2019 on day 7 of his hospital stay.  Subjective: Seen and examined this morning.  No major events overnight of this morning.  No complaint this morning.  Somewhat sleepy but easily wakes to voice.  Denies chest pain, dyspnea, GI or UTI symptoms.  Objective: Vitals:   07/05/19 0300 07/05/19 0400 07/05/19 0655 07/05/19 1045  BP:  125/63 126/67 (!) 158/69  Pulse: 66 61 63 63  Resp: 18 14 16 18   Temp:  (!) 97.5 F (36.4 C) 97.6 F (36.4 C) (!) 97.5 F (36.4 C)  TempSrc:  Oral Oral Oral  SpO2: 98% 100% 100% 96%  Weight:      Height:        Intake/Output Summary (Last 24 hours) at 07/05/2019 1049 Last data filed at 07/05/2019 0228 Gross per 24 hour  Intake 240 ml  Output 1275 ml  Net -1035 ml   Filed Weights   06/27/19 0409 06/28/19 0500 06/29/19 0515  Weight: 132.9 kg 131.8 kg 124.6 kg    Examination:  GENERAL: No apparent distress.  Nontoxic. HEENT: MMM.  Vision and hearing grossly intact.  NECK: Supple.  No apparent JVD but difficult exam due to body habitus. RESP: On 1 L by [place].  No IWOB.  Fair aeration but limited exam due to body habitus. CVS:  RRR. Heart sounds normal.  ABD/GI/GU: Bowel sounds present. Soft. Non tender.  MSK/EXT:  Moves extremities. No  apparent deformity.  BLE trace edema. SKIN: no apparent skin lesion or wound NEURO: Awake, alert and oriented x4.  Generalized BLE weakness. PSYCH: Calm. Normal affect.  Fair insight.  Procedures:  ETT 3/8 >>3/10 R Radial ALine 3/7 >> 3/9  Assessment & Plan: Acute on chronic combined CHF (EF 35-40% by TTE on 3/8)/ICM s/p CRT-D:  Fluid status appears to have improved although exam is difficult due to body habitus.  He has trace BLE edema.  Respiratory status seems to be improving.  Per cardiology, device interrogation with significant burden of arrhythmia.  Had 1.3 L UOP.  Creatinine slightly up but appears to be at baseline.  BNP improving. -Cardiology signed off-recs: Reduced Lasix to 40 mg daily -GDMT-on metoprolol XL 50 mg and Entresto 97/103 mg twice daily per cardiology -Monitor fluid status, renal function and electrolytes  CAD s/p PCI in 2008: No chest pain.  High-sensitivity troponin negative. -Cardiac meds as above -Added atorvastatin 20 mg daily  Chronic atrial fibrillation: Rate controlled on metoprolol.  CHA2DS2-VASc score >3:  -Continue metoprolol warfarin-INR therapeutic.  AKI on CKD stage IIIa:Baseline Cr 1.2-1.5> 1.65 (admit)>> 1.11> 1.44 -Continue monitoring  Anemia of renal disease: Baseline Hgb 9-10>> 11.2 (admit)>> 7.9 >>8.8. -Check anemia panel -Continue monitoring  Septic shock due to Enterococcus UTI: Was concern about concurrent adrenal insufficiency  but BP stable off steroids.  Completed antibiotic course for UTI.   Enterococcus UTI:  Completed appropriate antibiotic course  Acute metabolic encephalopathy:Multifactorial including sepsis due to UTI, hypercarbia, metabolic acidosis and acute illness.  CT head without acute finding.  Encephalopathy seems to have resolved. -Treat treatable causes  Acute on chronic respiratory failure with hypoxia and hypercarbia: marked CO2 on ABG.  Has history of OSA.  -Encourage nightly BiPAP -Wean oxygen to room  air during the day.  Currently 3 L at night if he refuses BiPAP.  Morbid Obesity with OSA, OHS: Body mass index is 41.77 kg/m. -Oxygen and BiPAP as above -Encourage lifestyle change to lose weight  Hypernatremia: Resolved.  Hypomagnesemia: Resolved -Monitor and replenish as appropriate  Dysphagia likely in the setting of intubation -Cleared by speech for regular diet.  Deconditioning/debility:Secondary to acute illness-suspect some amount of frailty at baseline. Evaluated by rehab services-plans are for SNF on discharge  Uncontrolled DM-2 with hyperglycemia: A1c 8.3% on 06/25/2019. Recent Labs    07/04/19 1622 07/04/19 2115 07/05/19 0637  GLUCAP 207* 175* 164*  -Continue current insulin regimen and Lipitor.        Nutrition Problem: Increased nutrient needs Etiology: acute illness  Signs/Symptoms: estimated needs  Interventions: Tube feeding, Prostat   DVT prophylaxis: On warfarin for A. fib Code Status: Full code Family Communication: Patient and/or RN. Available if any question.  Discharge barrier: Safe disposition which would be SNF Patient is from: Home Final disposition: SNF when bed available.  Consultants: PCCM (off), cardiology (off)   Microbiology summarized: 3/6influenza/Covid: Negative 3/6 blood cultures: Negative 3/6 urine culture: Enterococcus faecalis 3/7 MRSA PCR negative  Sch Meds:  Scheduled Meds: . atorvastatin  20 mg Oral q1800  . Chlorhexidine Gluconate Cloth  6 each Topical Daily  . FLUoxetine  20 mg Oral QPM  . [START ON 07/06/2019] furosemide  40 mg Oral Daily  . insulin aspart  0-15 Units Subcutaneous TID WC  . insulin aspart  0-5 Units Subcutaneous QHS  . insulin aspart  6 Units Subcutaneous TID WC  . insulin detemir  5 Units Subcutaneous QHS  . levETIRAcetam  750 mg Oral BID  . mouth rinse  15 mL Mouth Rinse BID  . Melatonin  3 mg Oral QHS  . metoprolol tartrate  25 mg Oral BID  . pantoprazole  40 mg Oral Daily  .  QUEtiapine  25 mg Oral QHS  . sacubitril-valsartan  1 tablet Oral BID  . sodium chloride flush  10-40 mL Intracatheter Q12H  . tamsulosin  0.8 mg Oral QPM  . Warfarin - Pharmacist Dosing Inpatient   Does not apply q1800   Continuous Infusions: PRN Meds:.acetaminophen, Resource ThickenUp Clear  Antimicrobials: Anti-infectives (From admission, onward)   Start     Dose/Rate Route Frequency Ordered Stop   06/26/19 1800  vancomycin (VANCOREADY) IVPB 1500 mg/300 mL  Status:  Discontinued     1,500 mg 150 mL/hr over 120 Minutes Intravenous Every 48 hours 06/25/19 1002 06/25/19 1431   06/26/19 1000  Ampicillin-Sulbactam (UNASYN) 3 g in sodium chloride 0.9 % 100 mL IVPB  Status:  Discontinued     3 g 200 mL/hr over 30 Minutes Intravenous Every 6 hours 06/26/19 0919 06/27/19 1337   06/24/19 2200  ceFEPIme (MAXIPIME) 2 g in sodium chloride 0.9 % 100 mL IVPB  Status:  Discontinued     2 g 200 mL/hr over 30 Minutes Intravenous Every 12 hours 06/24/19 1134 06/26/19 0851   06/24/19 1800  vancomycin (VANCOREADY) IVPB 1500 mg/300 mL  Status:  Discontinued     1,500 mg 150  mL/hr over 120 Minutes Intravenous Every 24 hours 06/23/19 1759 06/25/19 1002   06/24/19 0100  ceFEPIme (MAXIPIME) 2 g in sodium chloride 0.9 % 100 mL IVPB  Status:  Discontinued     2 g 200 mL/hr over 30 Minutes Intravenous Every 8 hours 06/23/19 1759 06/24/19 1134   06/23/19 1800  vancomycin (VANCOREADY) IVPB 2000 mg/400 mL     2,000 mg 200 mL/hr over 120 Minutes Intravenous  Once 06/23/19 1759 06/23/19 2056   06/23/19 1700  ceFEPIme (MAXIPIME) 2 g in sodium chloride 0.9 % 100 mL IVPB     2 g 200 mL/hr over 30 Minutes Intravenous  Once 06/23/19 1655 06/23/19 1741   06/23/19 1700  metroNIDAZOLE (FLAGYL) IVPB 500 mg     500 mg 100 mL/hr over 60 Minutes Intravenous  Once 06/23/19 1655 06/23/19 1849   06/23/19 1700  vancomycin (VANCOCIN) IVPB 1000 mg/200 mL premix  Status:  Discontinued     1,000 mg 200 mL/hr over 60 Minutes  Intravenous  Once 06/23/19 1655 06/23/19 1759       I have personally reviewed the following labs and images: CBC: Recent Labs  Lab 07/01/19 0511 07/02/19 0436 07/03/19 0530 07/04/19 0550 07/05/19 0341  WBC 8.1 8.5 8.7 8.2 9.1  NEUTROABS  --  5.6 5.9 5.7 6.6  HGB 8.8* 7.9* 8.9* 8.9* 8.8*  HCT 30.3* 27.2* 29.3* 29.2* 28.7*  MCV 95.0 94.4 93.0 91.8 91.4  PLT 117* 122* 117* 126* 129*   BMP &GFR Recent Labs  Lab 07/01/19 0511 07/02/19 0436 07/03/19 0530 07/04/19 0550 07/05/19 0341  NA 151* 151* 148* 142 140  K 4.2 4.1 3.5 3.6 3.8  CL 109 109 106 100 99  CO2 32 32 33* 33* 34*  GLUCOSE 105* 99 108* 118* 198*  BUN 49* 36* 32* 22 21  CREATININE 1.21 1.20 1.21 1.11 1.44*  CALCIUM 9.0 9.0 9.0 8.5* 8.2*  MG 1.9 1.8 1.5* 1.8 1.7   Estimated Creatinine Clearance: 54.4 mL/min (A) (by C-G formula based on SCr of 1.44 mg/dL (H)). Liver & Pancreas: Recent Labs  Lab 07/01/19 0511  AST 15  ALT 19  ALKPHOS 83  BILITOT 0.9  PROT 5.6*  ALBUMIN 2.4*   No results for input(s): LIPASE, AMYLASE in the last 168 hours. No results for input(s): AMMONIA in the last 168 hours. Diabetic: No results for input(s): HGBA1C in the last 72 hours. Recent Labs  Lab 07/04/19 0853 07/04/19 1130 07/04/19 1622 07/04/19 2115 07/05/19 0637  GLUCAP 177* 206* 207* 175* 164*   Cardiac Enzymes: No results for input(s): CKTOTAL, CKMB, CKMBINDEX, TROPONINI in the last 168 hours. No results for input(s): PROBNP in the last 8760 hours. Coagulation Profile: Recent Labs  Lab 07/01/19 0511 07/02/19 0436 07/03/19 0530 07/04/19 0550 07/05/19 0341  INR 2.9* 3.2* 3.3* 2.7* 2.2*   Thyroid Function Tests: No results for input(s): TSH, T4TOTAL, FREET4, T3FREE, THYROIDAB in the last 72 hours. Lipid Profile: No results for input(s): CHOL, HDL, LDLCALC, TRIG, CHOLHDL, LDLDIRECT in the last 72 hours. Anemia Panel: No results for input(s): VITAMINB12, FOLATE, FERRITIN, TIBC, IRON, RETICCTPCT in the last  72 hours. Urine analysis:    Component Value Date/Time   COLORURINE YELLOW 06/23/2019 2030   APPEARANCEUR CLEAR 06/23/2019 2030   LABSPEC 1.015 06/23/2019 2030   PHURINE 5.0 06/23/2019 2030   GLUCOSEU NEGATIVE 06/23/2019 2030   HGBUR NEGATIVE 06/23/2019 2030   BILIRUBINUR NEGATIVE 06/23/2019 2030   KETONESUR NEGATIVE 06/23/2019 2030   PROTEINUR 30 (A) 06/23/2019  2030   NITRITE NEGATIVE 06/23/2019 2030   LEUKOCYTESUR NEGATIVE 06/23/2019 2030   Sepsis Labs: Invalid input(s): PROCALCITONIN, LACTICIDVEN  Microbiology: No results found for this or any previous visit (from the past 240 hour(s)).  Radiology Studies: No results found.    Dayvion Sans T. Maurica Omura [facility]  If 7PM-7AM, please contact night-coverage [email or web address] Password [facility] 07/05/2019, 10:49 AM

## 2019-07-05 NOTE — Progress Notes (Signed)
Physical Therapy Treatment Patient Details Name: Noah [name] MRN: [id number] DOB: March 13, 1941 Today's Date: 07/05/2019    History of Present Illness 79 y/o M who presented to [facility] on 3/6 with reports of SOB, found to by hypotensive and hypothermic, treated for sepsis. Pt required intubation on 3/8 due to letahrgy and hypercarbia. Extubated on 3/10.    PT Comments    Patient continues to make gradual progress toward PT goals and tolerated session well. Pt requires 2L O2 to maintain SpO2 >89% while ambulating. Continue to progress as tolerated with anticipated d/c to SNF for further skilled PT services.     Follow Up Recommendations  SNF;Supervision/Assistance - 24 hour     Equipment Recommendations  (defer to post-acute setting)    Recommendations for Other Services       Precautions / Restrictions Precautions Precautions: Fall Precaution Comments: watch O2 Restrictions Weight Bearing Restrictions: No    Mobility  Bed Mobility Overal bed mobility: Needs Assistance Bed Mobility: Supine to Sit     Supine to sit: Min guard     General bed mobility comments: use of rail; min guard for safety, increased time and effort needed  Transfers Overall transfer level: Needs assistance Equipment used: Rolling walker (2 wheeled) Transfers: Sit to/from Stand Sit to Stand: Min assist         General transfer comment: min A to rise and steady with RW; needs bed raised and cues for safe hand placement  Ambulation/Gait Ambulation/Gait assistance: Min assist;Min guard;+2 safety/equipment(chair follow) Gait Distance (Feet): (60 ft total with seated rest break) Assistive device: Rolling walker (2 wheeled) Gait Pattern/deviations: Step-through pattern;Decreased step length - right;Decreased step length - left;Shuffle Gait velocity: decreased   General Gait Details: assist to steady; cues for increased stride length   Stairs             Wheelchair Mobility    Modified  Rankin (Stroke Patients Only)       Balance Overall balance assessment: Needs assistance Sitting-balance support: Feet supported Sitting balance-Leahy Scale: Good     Standing balance support: Bilateral upper extremity supported Standing balance-Leahy Scale: Poor Standing balance comment: reliant on external support                            Cognition Arousal/Alertness: Awake/alert Behavior During Therapy: WFL for tasks assessed/performed Overall Cognitive Status: Impaired/Different from baseline Area of Impairment: Orientation;Memory;Safety/judgement;Problem solving                 Orientation Level: Disoriented to;Time;Situation   Memory: Decreased short-term memory   Safety/Judgement: Decreased awareness of deficits   Problem Solving: Slow processing;Requires verbal cues;Requires tactile cues General Comments: pt continuing to need additional cues and time to process multistep tasks      Exercises      General Comments        Pertinent Vitals/Pain Pain Assessment: No/denies pain    Home Living                      Prior Function            PT Goals (current goals can now be found in the care plan section) Acute Rehab PT Goals Patient Stated Goal: return to walking more Progress towards PT goals: Progressing toward goals    Frequency    Min 2X/week      PT Plan Current plan remains appropriate    Co-evaluation PT/OT/SLP Co-Evaluation/Treatment: Yes  Reason for Co-Treatment: For patient/therapist safety PT goals addressed during session: Mobility/safety with mobility OT goals addressed during session: ADL's and self-care      AM-PAC PT "6 Clicks" Mobility   Outcome Measure  Help needed turning from your back to your side while in a flat bed without using bedrails?: A Little Help needed moving from lying on your back to sitting on the side of a flat bed without using bedrails?: A Lot Help needed moving to and from a  bed to a chair (including a wheelchair)?: A Little Help needed standing up from a chair using your arms (e.g., wheelchair or bedside chair)?: A Little Help needed to walk in hospital room?: A Little Help needed climbing 3-5 steps with a railing? : A Lot 6 Click Score: 16    End of Session Equipment Utilized During Treatment: Oxygen;Gait belt Activity Tolerance: Patient tolerated treatment well Patient left: in chair;with call bell/phone within reach;Other (comment)(OT present end of session) Nurse Communication: Mobility status PT Visit Diagnosis: Muscle weakness (generalized) (M62.81)     Time: 0973-5329 PT Time Calculation (min) (ACUTE ONLY): 19 min  Charges:  $Gait Training: 8-22 mins                     Earney Navy, PTA Acute Rehabilitation Services Pager: [phone] Office: [phone]     Darliss Cheney 07/05/2019, 5:55 PM

## 2019-07-05 NOTE — Progress Notes (Signed)
Progress Note  Patient Name: Noah [name] Date of Encounter: 07/05/2019  Primary Cardiologist: Prentice Docker, MD   Subjective   Feels well, muscles are very weak.  Had his first 24 hours without any atrial arrhythmia whatsoever.  Pressure now normal.  Creatinine inching up.  Inpatient Medications    Scheduled Meds: . atorvastatin  20 mg Oral q1800  . Chlorhexidine Gluconate Cloth  6 each Topical Daily  . FLUoxetine  20 mg Oral QPM  . [START ON 07/06/2019] furosemide  40 mg Oral Daily  . insulin aspart  0-15 Units Subcutaneous TID WC  . insulin aspart  0-5 Units Subcutaneous QHS  . insulin aspart  6 Units Subcutaneous TID WC  . insulin detemir  5 Units Subcutaneous QHS  . levETIRAcetam  750 mg Oral BID  . mouth rinse  15 mL Mouth Rinse BID  . Melatonin  3 mg Oral QHS  . metoprolol tartrate  25 mg Oral BID  . pantoprazole  40 mg Oral Daily  . QUEtiapine  25 mg Oral QHS  . sacubitril-valsartan  1 tablet Oral BID  . sodium chloride flush  10-40 mL Intracatheter Q12H  . tamsulosin  0.8 mg Oral QPM  . Warfarin - Pharmacist Dosing Inpatient   Does not apply q1800   Continuous Infusions:  PRN Meds: acetaminophen, Resource ThickenUp Clear   Vital Signs    Vitals:   07/05/19 0026 07/05/19 0300 07/05/19 0400 07/05/19 0655  BP: (!) 134/59  125/63 126/67  Pulse:  66 61 63  Resp: 16 18 14 16   Temp: (!) 96.8 F (36 C)  (!) 97.5 F (36.4 C) 97.6 F (36.4 C)  TempSrc: Axillary  Oral Oral  SpO2: 97% 98% 100% 100%  Weight:      Height:        Intake/Output Summary (Last 24 hours) at 07/05/2019 0858 Last data filed at 07/05/2019 0228 Gross per 24 hour  Intake 250 ml  Output 1275 ml  Net -1025 ml   Last 3 Weights 06/29/2019 06/28/2019 06/27/2019  Weight (lbs) 274 lb 11.1 oz 290 lb 9.1 oz 292 lb 15.9 oz  Weight (kg) 124.6 kg 131.8 kg 132.9 kg      Telemetry    Sinus rhythm with biventricular pacing- Personally Reviewed  ECG    No new tracings- Personally Reviewed   Physical Exam  Morbidly obese GEN: No acute distress.   Neck: No JVD Cardiac: RRR, paradoxically split S2 no murmurs, rubs, or gallops.  Respiratory: Clear to auscultation bilaterally. GI: Soft, nontender, non-distended  MS: No edema; No deformity. Neuro:  Nonfocal  Psych: Normal affect   Labs    High Sensitivity Troponin:   Recent Labs  Lab 06/23/19 1903 06/23/19 2125  TROPONINIHS 6 6      Chemistry Recent Labs  Lab 07/01/19 0511 07/02/19 0436 07/03/19 0530 07/04/19 0550 07/05/19 0341  NA 151*   < > 148* 142 140  K 4.2   < > 3.5 3.6 3.8  CL 109   < > 106 100 99  CO2 32   < > 33* 33* 34*  GLUCOSE 105*   < > 108* 118* 198*  BUN 49*   < > 32* 22 21  CREATININE 1.21   < > 1.21 1.11 1.44*  CALCIUM 9.0   < > 9.0 8.5* 8.2*  PROT 5.6*  --   --   --   --   ALBUMIN 2.4*  --   --   --   --  AST 15  --   --   --   --   ALT 19  --   --   --   --   ALKPHOS 83  --   --   --   --   BILITOT 0.9  --   --   --   --   GFRNONAA 57*   < > 57* >60 46*  GFRAA >60   < > >60 >60 54*  ANIONGAP 10   < > 9 9 7    < > = values in this interval not displayed.     Hematology Recent Labs  Lab 07/03/19 0530 07/04/19 0550 07/05/19 0341  WBC 8.7 8.2 9.1  RBC 3.15* 3.18* 3.14*  HGB 8.9* 8.9* 8.8*  HCT 29.3* 29.2* 28.7*  MCV 93.0 91.8 91.4  MCH 28.3 28.0 28.0  MCHC 30.4 30.5 30.7  RDW 13.3 13.1 13.2  PLT 117* 126* 129*    BNP Recent Labs  Lab 07/03/19 0530 07/04/19 0550 07/05/19 0342  BNP 824.4* 553.2* 412.3*     DDimer No results for input(s): DDIMER in the last 168 hours.   Radiology    No results found.  Cardiac Studies   ECHO 06/25/2019 1. Technically difficult; definity used; inferoseptal, inferolateral and  apical hypokinesis; overall moderate LV dysfunction; mild MR.  2. Left ventricular ejection fraction, by estimation, is 35 to 40%. The  left ventricle has moderately decreased function. The left ventricle  demonstrates regional wall motion abnormalities  (see scoring  diagram/findings for description). The left  ventricular internal cavity size was mildly dilated. Left ventricular  diastolic parameters are indeterminate.  3. Right ventricular systolic function is normal. The right ventricular  size is normal. There is moderately elevated pulmonary artery systolic  pressure.  4. The mitral valve is normal in structure. Mild mitral valve  regurgitation. No evidence of mitral stenosis.  5. The aortic valve is tricuspid. Aortic valve regurgitation is not  visualized. Mild aortic valve sclerosis is present, with no evidence of  aortic valve stenosis.  6. The inferior vena cava is dilated in size with <50% respiratory  variability, suggesting right atrial pressure of 15 mmHg.   Patient Profile     79 y.o. 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 08/24/2019 euvolemic.  Will decrease furosemide to his usual 40 mg daily dose.  Entresto now at maximum dose 2. AFib: Therapeutic INR.    No arrhythmia at all in last 24 hours, not even atrial tachycardia 3. CRT-D:Normal device function.  Transient reduction in CRT due to frequent atrial arrhythmia during acute infection. 4. PPI:RJJOACZ 5.OSA: Using CPAP 7.  Deconditioning: Planning to go to rehab after discharge.  He is ready to go from a cardiology point of view.     [facility] will sign off.   Medication Recommendations: Furosemide 40 mg once daily, Entresto 97-103 twice  daily, metoprolol succinate 50 mg once daily, warfarin titrated for INR 2.0-3.0, atorvastatin 20 mg once daily.  Aspirin should be stopped due to anticoagulation. Other recommendations (labs, testing,  etc): Repeat basic metabolic panel no later than 1 week Follow up as an outpatient: We will schedule follow-up in roughly 1 month, allowing for rehab stay  For questions or updates, please contact [place] Please consult [email or web address] for contact info under        Signed, Sanda Klein, MD  07/05/2019, 8:58 AM

## 2019-07-05 NOTE — Discharge Instructions (Signed)

## 2019-07-05 NOTE — Progress Notes (Signed)
Nutrition Education Note  RD consulted for nutrition education regarding a Heart Healthy/Diabetic diet.   RD provided "Heart Healthy Nutrition Therapy" handout from the Academy of Nutrition and Dietetics. Reviewed patient's dietary recall. Provided examples on ways to decrease sodium and fat intake in diet. Discouraged intake of processed foods and use of salt shaker. Encouraged fresh fruits and vegetables as well as whole grain sources of carbohydrates to maximize fiber intake.   RD provided "Carbohydrate Counting for People with Diabetes" handout from the Academy of Nutrition and Dietetics. Discussed different food groups and their effects on blood sugar, emphasizing carbohydrate-containing foods. Provided list of carbohydrates and recommended serving sizes of common foods.  Discussed importance of controlled and consistent carbohydrate intake throughout the day. Provided examples of ways to balance meals/snacks and encouraged intake of high-fiber, whole grain complex carbohydrates.   RD working remotely. Pt hard of hearing. States PTA he watched his salt intake. Usually seasons food with pepper only. Does not use canned foods often. Discussed other foods that contain salt and how to make substitutions. Reiterated importance of making meals balanced and reading food labels.   Teach back method used.  Expect fair compliance.  Vanessa Kick RD, LDN Clinical Nutrition Pager listed in AMION

## 2019-07-05 NOTE — Progress Notes (Signed)
ANTICOAGULATION CONSULT NOTE  Pharmacy Consult for Coumadin Indication: atrial fibrillation  Allergies  Allergen Reactions  . Codeine Other (See Comments)    constipation  . Erythromycin-Sulfisoxazole Other (See Comments)    Causes infection in throat and eyes    Patient Measurements: Height: 5\' 8"  (172.7 cm) Weight: 274 lb 11.1 oz (124.6 kg) IBW/kg (Calculated) : 68.4  Vital Signs: Temp: 97.5 F (36.4 C) (03/18 1045) Temp Source: Oral (03/18 1045) BP: 158/69 (03/18 1045) Pulse Rate: 63 (03/18 1045)  Labs: Recent Labs    07/03/19 0530 07/03/19 0530 07/04/19 0550 07/05/19 0341  HGB 8.9*   < > 8.9* 8.8*  HCT 29.3*  --  29.2* 28.7*  PLT 117*  --  126* 129*  LABPROT 33.2*  --  28.6* 24.6*  INR 3.3*  --  2.7* 2.2*  CREATININE 1.21  --  1.11 1.44*   < > = values in this interval not displayed.    Estimated Creatinine Clearance: 54.4 mL/min (A) (by C-G formula based on SCr of 1.44 mg/dL (H)).   Medical History: Past Medical History:  Diagnosis Date  . CAD S/P percutaneous coronary angioplasty    remote PCIs in 1990's- last CFX PCI 2008  . Chronic kidney disease, stage III (moderate)   . Diabetes mellitus with complication (HCC)    with hyperglycemia and diabetic autonomic poly neuropathy  . Gastro-esophageal reflux disease with esophagitis   . Hyperlipidemia   . Hypertension   . Ischemic cardiomyopathy 01/20/2018   St Jude ICD   . Morbid obesity (HCC)   . Obstructive sleep apnea    on CPAP  . Polyneuropathy   . Primary insomnia     Assessment: 79yo male presented to [facility] ED c/o SOB, concern for pulmonary edema, tx'd to [facility] for further evaluation, to continue Coumadin for Afib. Admission INR low at 1.2.  INR peaked 3.3 and dose Dulski.  INR today is therapeutic at 2.2 (dose Ziesmer on 3/15 and 3/16). Hgb up slightly to 8.9, plt 126. No s/sx of bleeding documented in chart. Minimal oral intake documented.  *PTA dose = 2.5 daily per recent anti-coag visit on  2/25.  Goal of Therapy:  INR 2-3   Plan:  Warfarin 2.5 mg daily - home dose   Daily INR, check CBC in am   3/25 Pharm.D. CPP, BCPS Clinical Pharmacist [phone] 07/05/2019 1:22 PM    Please check AMION for all [facility] phone numbers After 10:00 PM, call Main Pharmacy [phone] 07/05/2019  1:21 PM

## 2019-07-05 NOTE — Progress Notes (Signed)
Occupational Therapy Treatment Patient Details Name: Noah [name] MRN: [id number] DOB: Sep 16, 1940 Today's Date: 07/05/2019    History of present illness 79 y/o M who presented to [facility] on 3/6 with reports of SOB, found to by hypotensive and hypothermic, treated for sepsis. Pt required intubation on 3/8 due to letahrgy and hypercarbia. Extubated on 3/10.   OT comments  Pt progressing well toward stated goals; able to complete functional mobility with increased activity tolerance this session. Pt completed bed mobility at min guard and sit <> stands at min A with RW and bed raised. He then was able to complete functional mobility at a household distance. Pt initiated functional mobility on RA with O2 sats dropping to 85%. 2LPM [place] applied with VSS with ECS techniques implemented as well. D/c recs remain appropriate. Will continue to follow.   Follow Up Recommendations  SNF;Supervision/Assistance - 24 hour    Equipment Recommendations  None recommended by OT    Recommendations for Other Services      Precautions / Restrictions Precautions Precautions: Fall Precaution Comments: watch O2 Restrictions Weight Bearing Restrictions: No       Mobility Bed Mobility Overal bed mobility: Needs Assistance Bed Mobility: Supine to Sit     Supine to sit: Min guard     General bed mobility comments: min guard for safety, increased time and effort needed  Transfers Overall transfer level: Needs assistance Equipment used: Rolling walker (2 wheeled) Transfers: Sit to/from Stand Sit to Stand: Min assist         General transfer comment: min A to rise and steady with RW; needs bed raised and cues for safe hand placement    Balance Overall balance assessment: Needs assistance Sitting-balance support: Feet supported Sitting balance-Leahy Scale: Good     Standing balance support: Bilateral upper extremity supported Standing balance-Leahy Scale: Poor Standing balance comment: reliant on  external support                           ADL either performed or assessed with clinical judgement   ADL Overall ADL's : Needs assistance/impaired Eating/Feeding: Set up;Sitting Eating/Feeding Details (indicate cue type and reason): feeding self jello Grooming: Min guard;Standing       Lower Body Bathing: Minimal assistance;Sitting/lateral leans Lower Body Bathing Details (indicate cue type and reason): to rub lotion on BLEs       Lower Body Dressing Details (indicate cue type and reason): uses sock aide at baseline Toilet Transfer: Minimal assistance;Ambulation;RW;BSC   Toileting- Clothing Manipulation and Hygiene: Moderate assistance;Sit to/from stand       Functional mobility during ADLs: Minimal assistance;Cueing for safety;Rolling walker General ADL Comments: pt able to progress in BADL mobility this date and increase activity tolerance for multistep tasks     Vision Baseline Vision/History: Wears glasses Wears Glasses: At all times Patient Visual Report: No change from baseline     Perception     Praxis      Cognition Arousal/Alertness: Awake/alert Behavior During Therapy: WFL for tasks assessed/performed Overall Cognitive Status: Impaired/Different from baseline Area of Impairment: Orientation;Memory;Safety/judgement;Problem solving                 Orientation Level: Disoriented to;Time;Situation   Memory: Decreased short-term memory   Safety/Judgement: Decreased awareness of deficits   Problem Solving: Slow processing;Requires verbal cues;Requires tactile cues General Comments: pt with improved cognition from eval, but continuing to need additional cues and time to process multistep tasks  Exercises     Shoulder Instructions       General Comments      Pertinent Vitals/ Pain       Pain Assessment: No/denies pain  Home Living                                          Prior Functioning/Environment               Frequency  Min 2X/week        Progress Toward Goals  OT Goals(current goals can now be found in the care plan section)  Progress towards OT goals: Progressing toward goals  Acute Rehab OT Goals Patient Stated Goal: return to walking more OT Goal Formulation: With patient Time For Goal Achievement: 07/12/19 Potential to Achieve Goals: Good  Plan Discharge plan remains appropriate    Co-evaluation    PT/OT/SLP Co-Evaluation/Treatment: Yes Reason for Co-Treatment: For patient/therapist safety;To address functional/ADL transfers PT goals addressed during session: Mobility/safety with mobility OT goals addressed during session: ADL's and self-care      AM-PAC OT "6 Clicks" Daily Activity     Outcome Measure   Help from another person eating meals?: None Help from another person taking care of personal grooming?: A Little Help from another person toileting, which includes using toliet, bedpan, or urinal?: A Lot Help from another person bathing (including washing, rinsing, drying)?: A Lot Help from another person to put on and taking off regular upper body clothing?: A Little Help from another person to put on and taking off regular lower body clothing?: A Lot 6 Click Score: 16    End of Session Equipment Utilized During Treatment: Gait belt;Rolling walker;Oxygen  OT Visit Diagnosis: Unsteadiness on feet (R26.81);Other abnormalities of gait and mobility (R26.89);Muscle weakness (generalized) (M62.81);Other symptoms and signs involving cognitive function   Activity Tolerance Patient tolerated treatment well   Patient Left in chair;with call bell/phone within reach;with chair alarm set   Nurse Communication Mobility status        Time: 3710-6269 OT Time Calculation (min): 35 min  Charges: OT General Charges $OT Visit: 1 Visit OT Treatments $Self Care/Home Management : 8-22 mins  Zenovia Jarred, MSOT, OTR/L [facility] Office  Number: [phone] Pager: [phone]  Zenovia Jarred 07/05/2019, 5:43 PM

## 2019-07-05 NOTE — TOC Progression Note (Signed)
Transition of Care [facility]) - Progression Note    Patient Details  Name: Noah [name] MRN: [id number] Date of Birth: 03/04/1941  Transition of [facility]) CM/SW [place], [place] Phone Number: 07/05/2019, 12:26 PM  Clinical Narrative:     CSW notes insurance auth still pending at [facility] at this time per [place] with admissions. CSW met with patient at bedside to inform, he reports still being agreeable to [facility] as he reports this is near his home.   Expected Discharge Plan: Skilled Nursing Facility Barriers to Discharge: Continued Medical Work up  Expected Discharge Plan and Services Expected Discharge Plan: [place]: [place] arrangements for the past 2 months: Single Family Home                                       Social Determinants of Health (SDOH) Interventions    Readmission Risk Interventions Readmission Risk Prevention Plan 04/25/2019 04/17/2019  Transportation Screening Complete Complete  PCP or Specialist Appt within 3-5 Days - Not Complete  HRI or [place] - Complete  Social Work Consult for [place] Planning/Counseling - Complete  Palliative Care Screening - Not Complete  Medication Review Press photographer) Complete Complete  PCP or Specialist appointment within 3-5 days of discharge Not Complete -  PCP/Specialist Appt Not Complete comments patient discharging to SNF -  [place] or Home Care Consult Not Complete -  SW Recovery Care/Counseling Consult Complete -  Palliative Care Screening Not Applicable -  [place] Complete -  Some recent data might be hidden

## 2019-07-06 DIAGNOSIS — I4891 Unspecified atrial fibrillation: Secondary | ICD-10-CM | POA: Diagnosis not present

## 2019-07-06 DIAGNOSIS — N179 Acute kidney failure, unspecified: Secondary | ICD-10-CM | POA: Diagnosis not present

## 2019-07-06 DIAGNOSIS — I482 Chronic atrial fibrillation, unspecified: Secondary | ICD-10-CM | POA: Diagnosis not present

## 2019-07-06 DIAGNOSIS — Z7401 Bed confinement status: Secondary | ICD-10-CM | POA: Diagnosis not present

## 2019-07-06 DIAGNOSIS — D631 Anemia in chronic kidney disease: Secondary | ICD-10-CM | POA: Diagnosis not present

## 2019-07-06 DIAGNOSIS — R52 Pain, unspecified: Secondary | ICD-10-CM | POA: Diagnosis not present

## 2019-07-06 DIAGNOSIS — E1165 Type 2 diabetes mellitus with hyperglycemia: Secondary | ICD-10-CM | POA: Diagnosis not present

## 2019-07-06 DIAGNOSIS — I4811 Longstanding persistent atrial fibrillation: Secondary | ICD-10-CM | POA: Diagnosis not present

## 2019-07-06 DIAGNOSIS — Z95810 Presence of automatic (implantable) cardiac defibrillator: Secondary | ICD-10-CM | POA: Diagnosis not present

## 2019-07-06 DIAGNOSIS — I10 Essential (primary) hypertension: Secondary | ICD-10-CM | POA: Diagnosis not present

## 2019-07-06 DIAGNOSIS — J9601 Acute respiratory failure with hypoxia: Secondary | ICD-10-CM | POA: Diagnosis not present

## 2019-07-06 DIAGNOSIS — R791 Abnormal coagulation profile: Secondary | ICD-10-CM | POA: Diagnosis not present

## 2019-07-06 DIAGNOSIS — J810 Acute pulmonary edema: Secondary | ICD-10-CM | POA: Diagnosis not present

## 2019-07-06 DIAGNOSIS — E1140 Type 2 diabetes mellitus with diabetic neuropathy, unspecified: Secondary | ICD-10-CM | POA: Diagnosis not present

## 2019-07-06 DIAGNOSIS — I5043 Acute on chronic combined systolic (congestive) and diastolic (congestive) heart failure: Secondary | ICD-10-CM | POA: Diagnosis not present

## 2019-07-06 DIAGNOSIS — I255 Ischemic cardiomyopathy: Secondary | ICD-10-CM | POA: Diagnosis not present

## 2019-07-06 DIAGNOSIS — I499 Cardiac arrhythmia, unspecified: Secondary | ICD-10-CM | POA: Diagnosis not present

## 2019-07-06 DIAGNOSIS — I480 Paroxysmal atrial fibrillation: Secondary | ICD-10-CM | POA: Diagnosis not present

## 2019-07-06 DIAGNOSIS — I2510 Atherosclerotic heart disease of native coronary artery without angina pectoris: Secondary | ICD-10-CM | POA: Diagnosis not present

## 2019-07-06 DIAGNOSIS — K219 Gastro-esophageal reflux disease without esophagitis: Secondary | ICD-10-CM | POA: Diagnosis not present

## 2019-07-06 DIAGNOSIS — E870 Hyperosmolality and hypernatremia: Secondary | ICD-10-CM | POA: Diagnosis not present

## 2019-07-06 DIAGNOSIS — Z79899 Other long term (current) drug therapy: Secondary | ICD-10-CM | POA: Diagnosis not present

## 2019-07-06 DIAGNOSIS — G9341 Metabolic encephalopathy: Secondary | ICD-10-CM | POA: Diagnosis not present

## 2019-07-06 DIAGNOSIS — E7801 Familial hypercholesterolemia: Secondary | ICD-10-CM | POA: Diagnosis not present

## 2019-07-06 DIAGNOSIS — D649 Anemia, unspecified: Secondary | ICD-10-CM | POA: Diagnosis not present

## 2019-07-06 DIAGNOSIS — N1831 Chronic kidney disease, stage 3a: Secondary | ICD-10-CM | POA: Diagnosis not present

## 2019-07-06 DIAGNOSIS — M2550 Pain in unspecified joint: Secondary | ICD-10-CM | POA: Diagnosis not present

## 2019-07-06 DIAGNOSIS — N390 Urinary tract infection, site not specified: Secondary | ICD-10-CM | POA: Diagnosis not present

## 2019-07-06 DIAGNOSIS — G4733 Obstructive sleep apnea (adult) (pediatric): Secondary | ICD-10-CM | POA: Diagnosis not present

## 2019-07-06 DIAGNOSIS — I484 Atypical atrial flutter: Secondary | ICD-10-CM | POA: Diagnosis not present

## 2019-07-06 LAB — CBC
HCT: 29.5 % — ABNORMAL LOW (ref 39.0–52.0)
Hemoglobin: 9.2 g/dL — ABNORMAL LOW (ref 13.0–17.0)
MCH: 28.7 pg (ref 26.0–34.0)
MCHC: 31.2 g/dL (ref 30.0–36.0)
MCV: 91.9 fL (ref 80.0–100.0)
Platelets: 169 10*3/uL (ref 150–400)
RBC: 3.21 MIL/uL — ABNORMAL LOW (ref 4.22–5.81)
RDW: 13.3 % (ref 11.5–15.5)
WBC: 10.3 10*3/uL (ref 4.0–10.5)
nRBC: 0 % (ref 0.0–0.2)

## 2019-07-06 LAB — RENAL FUNCTION PANEL
Albumin: 2.3 g/dL — ABNORMAL LOW (ref 3.5–5.0)
Anion gap: 8 (ref 5–15)
BUN: 23 mg/dL (ref 8–23)
CO2: 32 mmol/L (ref 22–32)
Calcium: 8.4 mg/dL — ABNORMAL LOW (ref 8.9–10.3)
Chloride: 98 mmol/L (ref 98–111)
Creatinine, Ser: 1.32 mg/dL — ABNORMAL HIGH (ref 0.61–1.24)
GFR calc Af Amer: 59 mL/min — ABNORMAL LOW (ref >60)
GFR calc non Af Amer: 51 mL/min — ABNORMAL LOW (ref >60)
Glucose, Bld: 183 mg/dL — ABNORMAL HIGH (ref 70–99)
Phosphorus: 2.9 mg/dL (ref 2.5–4.6)
Potassium: 4.2 mmol/L (ref 3.5–5.1)
Sodium: 138 mmol/L (ref 135–145)

## 2019-07-06 LAB — GLUCOSE, CAPILLARY
Glucose-Capillary: 158 mg/dL — ABNORMAL HIGH (ref 70–99)
Glucose-Capillary: 178 mg/dL — ABNORMAL HIGH (ref 70–99)

## 2019-07-06 LAB — MAGNESIUM: Magnesium: 1.6 mg/dL — ABNORMAL LOW (ref 1.7–2.4)

## 2019-07-06 LAB — PROTIME-INR
INR: 2.3 — ABNORMAL HIGH (ref 0.8–1.2)
Prothrombin Time: 24.9 seconds — ABNORMAL HIGH (ref 11.4–15.2)

## 2019-07-06 MED ORDER — SENNOSIDES-DOCUSATE SODIUM 8.6-50 MG PO TABS: 1.0000 | ORAL_TABLET | Freq: Two times a day (BID) | ORAL | 0 refills | Status: AC | PRN

## 2019-07-06 MED ORDER — SACUBITRIL-VALSARTAN 97-103 MG PO TABS: 1.0000 | ORAL_TABLET | Freq: Two times a day (BID) | ORAL | 1 refills | Status: AC

## 2019-07-06 MED ORDER — MAGNESIUM SULFATE 2 GM/50ML IV SOLN
2.0000 g | Freq: Once | INTRAVENOUS | Status: AC
  Administered 2019-07-06: 2 g via INTRAVENOUS
  Filled 2019-07-06: qty 50

## 2019-07-06 MED ORDER — POLYETHYLENE GLYCOL 3350 17 GM/SCOOP PO POWD: 17.0000 g | Freq: Two times a day (BID) | ORAL | 0 refills | Status: AC | PRN

## 2019-07-06 MED ORDER — SOLIQUA 100-33 UNT-MCG/ML ~~LOC~~ SOPN: 40.0000 [IU] | PEN_INJECTOR | Freq: Every day | SUBCUTANEOUS | 2 refills | Status: AC

## 2019-07-06 MED ORDER — FERROUS SULFATE 325 (65 FE) MG PO TABS: 325.0000 mg | ORAL_TABLET | Freq: Two times a day (BID) | ORAL | 1 refills | Status: DC

## 2019-07-06 MED ORDER — ATORVASTATIN CALCIUM 20 MG PO TABS: 20.0000 mg | ORAL_TABLET | Freq: Every day | ORAL | 1 refills | Status: DC

## 2019-07-06 NOTE — TOC Progression Note (Addendum)
Transition of Care [facility]) - Progression Note    Patient Details  Name: Noah [name] MRN: [id number] Date of Birth: 26-May-1940  Transition of Care [facility]) CM/SW Contact  Terrial Rhodes, LCSWA Phone Number: 07/06/2019, 9:39 AM   Clinical Narrative:     Update- Insurance authorization approved. Insurance auth. Number is [id number].  CSW spoke with Thayer Ohm from [facility] regarding status of insurance authorization. Thayer Ohm informed CSW that insurance auth. Is still pending and that he is hoping to have an answer by today. CSW sent over updated PT, OT notes so that Thayer Ohm can send those to [place] to aide in timeliness of insurance auth.    Expected Discharge Plan: Skilled Nursing Facility Barriers to Discharge: Continued Medical Work up  Expected Discharge Plan and Services Expected Discharge Plan: Skilled Nursing Facility     Post Acute Care Choice: Skilled Nursing Facility Living arrangements for the past 2 months: Single Family Home                                       Social Determinants of Health (SDOH) Interventions    Readmission Risk Interventions Readmission Risk Prevention Plan 04/25/2019 04/17/2019  Transportation Screening Complete Complete  PCP or Specialist Appt within 3-5 Days - Not Complete  HRI or Home Care Consult - Complete  Social Work Consult for Recovery Care Planning/Counseling - Complete  Palliative Care Screening - Not Complete  Medication Review Oceanographer) Complete Complete  PCP or Specialist appointment within 3-5 days of discharge Not Complete -  PCP/Specialist Appt Not Complete comments patient discharging to SNF -  HRI or Home Care Consult Not Complete -  SW Recovery Care/Counseling Consult Complete -  Palliative Care Screening Not Applicable -  Skilled Nursing Facility Complete -  Some recent data might be hidden

## 2019-07-06 NOTE — Discharge Summary (Signed)
Physician Discharge Summary  Noah [name] ZOX:[id number] DOB: 13-Jan-1941 DOA: 06/23/2019  PCP: Estanislado Pandy, MD  Admit date: 06/23/2019 Discharge date: 07/06/2019  Admitted From: Home Disposition: SNF  Recommendations for Outpatient Follow-up:  1. Follow ups as below. 2. Please obtain CBC/BMP/Mag in 1 week 3. Please follow up on the following pending results: None  Discharge Condition: Stable CODE STATUS: Full code  Follow-up Information    Laqueta Linden, MD Follow up on 08/13/2019.   Specialty: Cardiology Why: 4:00 pm hospital follow up Contact information: [address] [place] [address] [phone]          Hospital Course: 79 y.o.malewith past medical history of CAD s/p PCI in 2008, ischemic cardiomyopathy, combined chronic systolic and diastolic heart failure, s/p BiV ICD, A. fib on anticoagulation, DM, HTN, GERD, HLD, OSA-3 L nocturnally, CKD stage IIIa who presented to [facility] on 2/6 with hypothermia and hypotension. Chest x-ray showed pulmonary edema, patient was started on Levophed infusion and transferred to [facility] course complicated by worsening somnolence, hypercarbia-required intubation on 3/8, he was transferred to [facility] care on 07/01/2019 on day 7 of his hospital stay.  Patient completed antibiotic course for sepsis due to Enterococcus UTI.  Encephalopathy resolved.  Patient diuresed with IV Lasix with significant improvement in his breathing and symptoms.  Eventually transition to oral Lasix and continue to diurese well.  Renal function remained stable.  Patient was evaluated by PT/OT who recommended SNF.  On the day of discharge, patient saturating well on room air at rest.  However, he required 2 L with ambulation to maintain saturation above 89%.  Please encourage patient to wear CPAP/BiPAP at night.  BiPAP setting are 12/5 but can titrate up to 14/7 given body habitus.  See individual problem list below  for more hospital course.  Discharge Diagnoses:  Acute on chronic combined CHF (EF 35-40% by TTE on 3/8)/ICM s/p CRT-D: Fluid status appears to have improved although exam is difficult due to body habitus.  He has trace BLE edema.  Respiratory status seems to be improving.  Per cardiology, device interrogation with significant burden of arrhythmia.  Had 1.2 L UOP.  Renal function and BNP improved.  Weight down from 284 pounds to 273 pounds. -Discharged on oral Lasix 40 mg daily -GDMT-on metoprolol XL 50 mg and Entresto 97/103 mg twice daily per cardiology -Daily weight.  Fluid restriction to 1500 cc (6 cups) a day.  Sodium restriction to less than 2 g a day. -Renal function and magnesium in 1 week.  CAD s/p PCI in 2008: No chest pain.  High-sensitivity troponin negative. -Cardiac meds as above. -Atorvastatin 20 mg daily  Chronic atrial fibrillation: Rate controlled on metoprolol.  CHA2DS2-VASc score >3: Goal INR 2-3. -Continue warfarin 2.5 mg daily.  Check INR in 1 week and adjust as appropriate.  AKI on CKD stage IIIa:Baseline Cr 1.2-1.5> 1.65 (admit)>> 1.11> 1.44> 1.33 -Recheck renal function and magnesium in 1 week  Anemia of renal disease: Baseline Hgb 9-10>> 11.2 (admit)>> 7.9 >>8.8> 9.2.  Iron saturation 13%. -Start oral ferrous sulfate with bowel regimen  Septic shock due to Enterococcus UTI: Was concern about concurrent adrenal insufficiency  but BP stable off steroids.  Completed antibiotic course for UTI.   Enterococcus UTI: Completed appropriate antibiotic course  Acute metabolic encephalopathy:Multifactorial including sepsis due to UTI, hypercarbia, metabolic acidosis and acute illness.  CT head without acute finding.  Encephalopathy seems to have resolved. -Treat treatable  causes  Acute on chronic respiratory failure with hypoxia and hypercarbia: marked CO2 on ABG.  Has history of OSA.  -Encourage nightly BiPAP -2 L by nasal cannula with ambulation.  Minimize  oxygen use at rest.  Goal saturation above 89%.  Morbid Obesity with OSA, OHS: Body mass index is 41.77 kg/m. -Oxygen and BiPAP as above -Encourage lifestyle change to lose weight  Hypernatremia: Resolved.  Hypomagnesemia: Replenished prior to discharge.  Dysphagia likely in the setting of intubation -Cleared by speech for regular diet.  Deconditioning/debility:Secondary to acute illness-suspect some amount of frailty at baseline. Evaluated by rehab services-plans are for SNF on discharge  Uncontrolled DM-2 with hyperglycemia: A1c 8.3% on 06/25/2019.  Required only 40 units in the last 24 hours. Recent Labs    07/05/19 2132 07/06/19 0556 07/06/19 1048  GLUCAP 186* 158* 178*  -Reduced home insulin to 40 units.  He is also on low-dose glipizide and GLP-1 inhibitors -Continue atorvastatin   Discharge Instructions  Discharge Instructions    Diet - low sodium heart healthy   Complete by: As directed    Diet Carb Modified   Complete by: As directed    Increase activity slowly   Complete by: As directed      Allergies as of 07/06/2019      Reactions   Codeine Other (See Comments)   constipation   Erythromycin-sulfisoxazole Other (See Comments)   Causes infection in throat and eyes      Medication List    STOP taking these medications   aspirin EC 81 MG tablet   Entresto 49-51 MG Generic drug: sacubitril-valsartan Replaced by: sacubitril-valsartan 97-103 MG     TAKE these medications   acetaminophen 650 MG CR tablet Commonly known as: TYLENOL Take 650-1,300 mg by mouth daily as needed for pain.   atorvastatin 20 MG tablet Commonly known as: LIPITOR Take 1 tablet (20 mg total) by mouth daily at 6 PM.   cetirizine 10 MG tablet Commonly known as: ZYRTEC Take 10 mg by mouth daily.   ferrous sulfate 325 (65 FE) MG tablet Take 1 tablet (325 mg total) by mouth 2 (two) times daily with a meal.   finasteride 5 MG tablet Commonly known as: PROSCAR Take 5  mg by mouth every evening.   Fish Oil 1000 MG Caps Take 1,000 mg by mouth 2 (two) times daily.   FLUoxetine 20 MG capsule Commonly known as: PROZAC Take 20 mg by mouth every evening.   FreeStyle Libre 14 Day Sensor Misc Inject 1 each into the skin every 14 (fourteen) days. Use as directed.   furosemide 40 MG tablet Commonly known as: LASIX Take 1 tablet (40 mg total) by mouth daily.   gabapentin 100 MG capsule Commonly known as: NEURONTIN Take 1 capsule (100 mg total) by mouth 2 (two) times daily.   glipiZIDE 2.5 MG 24 hr tablet Commonly known as: GLUCOTROL XL Take 1 tablet (2.5 mg total) by mouth daily with breakfast.   levETIRAcetam 750 MG tablet Commonly known as: KEPPRA Take 750 mg by mouth 2 (two) times daily.   metoprolol succinate 50 MG 24 hr tablet Commonly known as: Toprol XL Take 1 tablet (50 mg total) by mouth daily. Take with or immediately following a meal.   pantoprazole 40 MG tablet Commonly known as: PROTONIX Take 1 tablet (40 mg total) by mouth daily.   polyethylene glycol powder 17 GM/SCOOP powder Commonly known as: MiraLax Take 17 g by mouth 2 (two) times daily as needed for  moderate constipation.   sacubitril-valsartan 97-103 MG Commonly known as: ENTRESTO Take 1 tablet by mouth 2 (two) times daily. Replaces: Entresto 49-51 MG   senna-docusate 8.6-50 MG tablet Commonly known as: Senokot-S Take 1 tablet by mouth 2 (two) times daily between meals as needed for mild constipation.   Soliqua 100-33 UNT-MCG/ML Sopn Generic drug: Insulin Glargine-Lixisenatide Inject 40 Units into the skin daily with breakfast. What changed: how much to take   tamsulosin 0.4 MG Caps capsule Commonly known as: FLOMAX Take 0.8 mg by mouth every evening.   warfarin 5 MG tablet Commonly known as: COUMADIN TAKE 1/2 TABLET (2.5MG ) DAILY EXCEPT 1 TABLET ( ) ON TUESDAYS, THURSDAYS AND SATURDAYS   Zinc 50 MG Tabs Take 200 mg by mouth daily.        Consultations:  PCCM, cardiology  Procedures/Studies: ETT 3/8 >>3/10 R Radial ALine 3/7 >> 3/9  2D echo on 06/25/2019 1. Technically difficult; definity used; inferoseptal, inferolateral and  apical hypokinesis; overall moderate LV dysfunction; mild MR.  2. Left ventricular ejection fraction, by estimation, is 35 to 40%. The  left ventricle has moderately decreased function. The left ventricle  demonstrates regional wall motion abnormalities (see scoring  diagram/findings for description). The left  ventricular internal cavity size was mildly dilated. Left ventricular  diastolic parameters are indeterminate.  3. Right ventricular systolic function is normal. The right ventricular  size is normal. There is moderately elevated pulmonary artery systolic  pressure.  4. The mitral valve is normal in structure. Mild mitral valve  regurgitation. No evidence of mitral stenosis.  5. The aortic valve is tricuspid. Aortic valve regurgitation is not  visualized. Mild aortic valve sclerosis is present, with no evidence of  aortic valve stenosis.  6. The inferior vena cava is dilated in size with <50% respiratory  variability, suggesting right atrial pressure of 15 mmHg.    DG Abd 1 View  Result Date: 06/25/2019 CLINICAL DATA:  Respiratory failure.  OG tube placement EXAM: ABDOMEN - 1 VIEW COMPARISON:  Chest radiograph 06/25/2019 FINDINGS: NG tube extends to level of the diaphragm midline. Difficult to tell if tip of the tube is within stomach or hiatal hernia. IMPRESSION: NG tube extends in the esophagus to the level the diaphragm. Tip may be within the proximal stomach or within a hiatal hernia. Electronically Signed   By: Genevive Bi M.D.   On: 06/25/2019 07:41   CT HEAD WO CONTRAST  Result Date: 06/25/2019 CLINICAL DATA:  Encephalopathy. EXAM: CT HEAD WITHOUT CONTRAST TECHNIQUE: Contiguous axial images were obtained from the base of the skull through the vertex without  intravenous contrast. COMPARISON:  April 10, 2019. FINDINGS: Brain: Mild chronic ischemic white matter disease is noted. No mass effect or midline shift is noted. Ventricular size is within normal limits. There is no evidence of mass lesion, hemorrhage or acute infarction. Vascular: No hyperdense vessel or unexpected calcification. Skull: Normal. Negative for fracture or focal lesion. Sinuses/Orbits: No acute finding. Other: None. IMPRESSION: Mild chronic ischemic white matter disease. No acute intracranial abnormality seen. Electronically Signed   By: Lupita Raider M.D.   On: 06/25/2019 08:43   US RENAL  Result Date: 06/26/2019 CLINICAL DATA:  Acute kidney injury EXAM: RENAL / URINARY TRACT ULTRASOUND COMPLETE COMPARISON:  Ultrasound 04/16/2019 FINDINGS: Right Kidney: Renal measurements: 11.7 x 6 x 5.7 cm = volume: 206.7 mL . Echogenicity within normal limits. No mass or hydronephrosis visualized. Left Kidney: Renal measurements: 11.6 x 6.6 x 4.7 cm = volume: 186.8 mL.  Echogenicity within normal limits. No mass or hydronephrosis visualized. Bladder: Appears nearly empty.  Foley catheter. Other: None. IMPRESSION: Negative for hydronephrosis.  Foley decompression of the bladder. Electronically Signed   By: Jasmine Pang M.D.   On: 06/26/2019 20:20   DG Chest 1V REPEAT Same Day  Result Date: 06/23/2019 CLINICAL DATA:  Evaluate for pulmonary edema. EXAM: CHEST - 1 VIEW SAME DAY COMPARISON:  Chest radiograph 06/23/2019 at 4:19 p.m. FINDINGS: Stable cardiomediastinal contours with enlarged heart size. Left chest pacer in place. Central vascular congestion. There are diffuse bilateral heterogeneous pulmonary opacities likely moderate edema. Mild atelectatic change at the left base. No pneumothorax. Possible trace left pleural effusion. No acute finding in the visualized skeleton. IMPRESSION: Cardiomegaly with central congestion and bilateral pulmonary opacities likely moderate pulmonary edema. Possible trace  left effusion. Electronically Signed   By: Emmaline Kluver M.D.   On: 06/23/2019 20:45   DG Chest Port 1 View  Result Date: 07/01/2019 CLINICAL DATA:  Shortness of breath EXAM: PORTABLE CHEST 1 VIEW COMPARISON:  06/28/2019 FINDINGS: Cardiomegaly with mild interstitial edema. Patchy bilateral lower lobe opacities, likely atelectasis. Small bilateral pleural effusions. Left subclavian ICD. Old right posterior rib fracture deformities. IMPRESSION: Cardiomegaly with mild interstitial edema and small bilateral pleural effusions. Patchy bilateral lower lobe opacities, likely atelectasis. Electronically Signed   By: Charline Bills M.D.   On: 07/01/2019 08:13   DG CHEST PORT 1 VIEW  Result Date: 06/28/2019 CLINICAL DATA:  79 year old male with history of shortness of breath and acute respiratory failure with hypoxia. EXAM: PORTABLE CHEST 1 VIEW COMPARISON:  Chest x-ray 06/27/2019. FINDINGS: Patient has been extubated. Previously noted nasogastric tube has been withdrawn. There is a right upper extremity PICC with tip terminating in the distal superior vena cava. Left-sided biventricular pacemaker/AICD with lead tips projecting over the expected location of the right atrium, right ventricular apex and overlying the the left ventricle via the coronary sinus and coronary veins. Low lung volumes. Patchy multifocal interstitial and airspace disease in the lungs bilaterally. Small bilateral pleural effusions. Cephalization of the pulmonary vasculature. Mild cardiomegaly. Upper mediastinal contours are within normal limits. IMPRESSION: 1. Support apparatus, as above. 2. Low lung volumes with evidence of mild congestive heart failure, as above. Electronically Signed   By: Trudie Reed M.D.   On: 06/28/2019 09:25   DG CHEST PORT 1 VIEW  Result Date: 06/27/2019 CLINICAL DATA:  Respiratory failure EXAM: PORTABLE CHEST 1 VIEW COMPARISON:  06/26/2019 FINDINGS: The endotracheal tube and NG tubes are stable.  Persistent cardiac enlargement. Much improved lung aeration suggesting resolving pulmonary edema. There is a persistent small left pleural effusion and left lower lobe atelectasis. IMPRESSION: 1. Stable support apparatus. 2. Improving lung aeration with resolving pulmonary edema. 3. Persistent small left effusion and left basilar atelectasis. Electronically Signed   By: Rudie Meyer M.D.   On: 06/27/2019 09:17   DG CHEST PORT 1 VIEW  Result Date: 06/26/2019 CLINICAL DATA:  Respiratory failure. EXAM: PORTABLE CHEST 1 VIEW COMPARISON:  Chest x-ray 06/25/2019 FINDINGS: The endotracheal tube is 3.5 cm above the carina. The NG tube is coursing down the esophagus and into the stomach. The pacer wires are stable. Persistent cardiac enlargement. Persistent diffuse interstitial process, bilateral pleural effusions and streaky bibasilar atelectasis. No focal airspace consolidation or pneumothorax. IMPRESSION: 1. Stable support apparatus. 2. Persistent CHF changes. Electronically Signed   By: Rudie Meyer M.D.   On: 06/26/2019 05:04   DG CHEST PORT 1 VIEW  Result Date: 06/25/2019  CLINICAL DATA:  Intubation EXAM: PORTABLE CHEST 1 VIEW COMPARISON:  Earlier today FINDINGS: New endotracheal tube with tip 18 mm above the carina. New enteric tube which reaches the stomach. Cardiomegaly with interstitial opacities and small pleural effusion, the former is intervally improved. No visible pneumothorax. Cardiomegaly and biventricular pacer/ICD. IMPRESSION: 1. New endotracheal tube with tip 18 mm above the carina. 2. Probable CHF.  Mildly improved aeration. Electronically Signed   By: Marnee Spring M.D.   On: 06/25/2019 07:25   DG Chest Port 1 View  Result Date: 06/25/2019 CLINICAL DATA:  79 year old male with shortness of breath. EXAM: PORTABLE CHEST 1 VIEW COMPARISON:  Portable chest 06/23/2019 and earlier. FINDINGS: Portable AP semi upright view at 0331 hours. Stable left chest cardiac AICD. Stable cardiac size and  mediastinal contours. No pneumothorax. Chronic small left pleural effusion or pleural scarring appears stable since December. Other lung markings have not significantly changed since that time. No pneumothorax or air bronchograms. Chronic right rib fractures. No acute osseous abnormality identified. Paucity of bowel gas in the upper abdomen. IMPRESSION: Ventilation not significantly changed compared to December with small left pleural effusion and nonspecific increased interstitial markings. Interstitial edema is difficult to exclude. Electronically Signed   By: Odessa Fleming M.D.   On: 06/25/2019 03:48   DG Chest Portable 1 View  Result Date: 06/23/2019 CLINICAL DATA:  79 year old male with shortness of breath. EXAM: PORTABLE CHEST 1 VIEW COMPARISON:  Chest radiograph dated 04/12/2019. FINDINGS: There is shallow inspiration. Left lung base density, likely chronic atelectasis/scarring. Developing infiltrate is not excluded. An area of airspace density in the right suprahilar region appears similar to prior radiograph, likely chronic. Clinical correlation is recommended. No large pleural effusion. No pneumothorax. There is cardiomegaly with mild vascular congestion. Left pectoral AICD device. No acute osseous pathology. IMPRESSION: 1. Shallow inspiration with left lung base atelectasis/scarring. Developing infiltrate is less likely. Clinical correlation is recommended. 2. Stable appearing right suprahilar density, likely chronic. 3. Cardiomegaly with mild vascular congestion. Electronically Signed   By: Elgie Collard M.D.   On: 06/23/2019 16:35   DG SWALLOW [facility] SPEECH PATH  Result Date: 07/05/2019 Objective Swallowing Evaluation: Type of Study: MBS-Modified Barium Swallow Study  Patient Details Name: SAHEIM GEARY MRN: [id number] Date of Birth: [birth year] Today's Date: 07/05/2019 Time: SLP Start Time (ACUTE ONLY): 1505 -SLP Stop Time (ACUTE ONLY): 1520 SLP Time Calculation (min) (ACUTE ONLY): 15 min Past Medical  History: Past Medical History: Diagnosis Date . CAD S/P percutaneous coronary angioplasty   remote PCIs in 1990's- last CFX PCI 2008 . Chronic kidney disease, stage III (moderate)  . Diabetes mellitus with complication (HCC)   with hyperglycemia and diabetic autonomic poly neuropathy . Gastro-esophageal reflux disease with esophagitis  . Hyperlipidemia  . Hypertension  . Ischemic cardiomyopathy 01/20/2018  St Jude ICD  . Morbid obesity (HCC)  . Obstructive sleep apnea   on CPAP . Polyneuropathy  . Primary insomnia  Past Surgical History: Past Surgical History: Procedure Laterality Date . BIV ICD INSERTION CRT-D N/A 01/20/2018  Procedure: BIV ICD INSERTION CRT-D;  Surgeon: Marinus Maw, MD;  Location: [facility] INVASIVE CV LAB;  Service: Cardiovascular;  Laterality: N/A; . BIV PACEMAKER INSERTION CRT-P  01/20/2018  St Jude- Dr Ladona Ridgel . CARDIAC CATHETERIZATION    multiple angioplasty X 8, 4 stents  HPI: Pt is a 79 yr old with PMHx CAD s/p PCI in 2008, ischemic cardiomyopathy, combined systolic and diastolic dysfunction, s/p BiV ICD, A fib, DM, HTN, GERD, HLD,  OSA, and CKD Stage 3 who presented to [facility] ED on 3/6 with hypothermia and hypotension. Received 2.5 L IVF subsequent CXR showed pulmonary edema, started on Levophed gtt and transferred to [facility]. Chest x-ray 3/11: Low lung volumes with evidence of mild congestive heart failure. ETT 3/8-3/10  Limited MBS completed 3/12 secondary to pt's shoulders blocking larynx.  Pt diet advanced to regular/thin yesterday.  Today visit to assess po tolerance of advancement and for pt education.  Subjective: "Here lately I've been coughing some." Assessment / Plan / Recommendation CHL IP CLINICAL IMPRESSIONS 07/04/2019 Clinical Impression Pt was seen in radiology suite for modified barium swallow study. Visualization of the pt's airway was obscured due to the pt's shoulders. SLP attempted to reposition pt to facilitate a better view; however, pt ultimately requested that the  study be concluded due to his fatigue. Pt was advised that it should not take long to try to reposition him further but he stated, "I can't do this anymore". Trials were limited to thin liquids for this reason but a pharyngeal delay and possible penetration (PAS 3) of thin liquids were noted. Aspiration could not be ruled out due to suboptimal visualization. SLP will see pt at bedside to assess his ability to safely tolerate nectar thick liquids start a p.o. diet. A repeat instrumental assessment may be completed next week if clinically indicated.  SLP Visit Diagnosis Dysphagia, pharyngeal phase (R13.13) Attention and concentration deficit following -- Frontal lobe and executive function deficit following -- Impact on safety and function Mild aspiration risk;Moderate aspiration risk   CHL IP TREATMENT RECOMMENDATION 06/29/2019 Treatment Recommendations Therapy as outlined in treatment plan below   Prognosis 06/29/2019 Prognosis for Safe Diet Advancement Fair Barriers to Reach Goals Motivation;Behavior Barriers/Prognosis Comment -- CHL IP DIET RECOMMENDATION 07/04/2019 SLP Diet Recommendations NPO Liquid Administration via -- Medication Administration Crushed with puree Compensations Slow rate;Small sips/bites Postural Changes --   CHL IP OTHER RECOMMENDATIONS 07/04/2019 Recommended Consults -- Oral Care Recommendations Oral care BID Other Recommendations --   CH[facility] IP FOLLOW UP RECOMMENDATIONS 07/03/2019 Follow up Recommendations Skilled Nursing facility   [facility] IP FREQUENCY AND DURATION 06/29/2019 Speech Therapy Frequency (ACUTE ONLY) min 2x/week Treatment Duration 2 weeks      CHL IP ORAL PHASE 06/29/2019 Oral Phase WFL Oral - Pudding Teaspoon -- Oral - Pudding Cup -- Oral - Honey Teaspoon -- Oral - Honey Cup -- Oral - Nectar Teaspoon -- Oral - Nectar Cup -- Oral - Nectar Straw -- Oral - Thin Teaspoon -- Oral - Thin Cup -- Oral - Thin Straw -- Oral - Puree -- Oral - Mech Soft -- Oral - Regular -- Oral - Multi-Consistency --  Oral - Pill -- Oral Phase - Comment --  CHL IP PHARYNGEAL PHASE 06/29/2019 Pharyngeal Phase Impaired Pharyngeal- Pudding Teaspoon -- Pharyngeal -- Pharyngeal- Pudding Cup -- Pharyngeal -- Pharyngeal- Honey Teaspoon -- Pharyngeal -- Pharyngeal- Honey Cup -- Pharyngeal -- Pharyngeal- Nectar Teaspoon -- Pharyngeal -- Pharyngeal- Nectar Cup -- Pharyngeal -- Pharyngeal- Nectar Straw -- Pharyngeal -- Pharyngeal- Thin Teaspoon -- Pharyngeal -- Pharyngeal- Thin Cup Penetration/Aspiration before swallow;Penetration/Aspiration during swallow;Delayed swallow initiation-vallecula;Delayed swallow initiation-pyriform sinuses Pharyngeal Material enters airway, remains ABOVE vocal cords and not ejected out Pharyngeal- Thin Straw Penetration/Aspiration before swallow;Penetration/Aspiration during swallow;Delayed swallow initiation-vallecula;Delayed swallow initiation-pyriform sinuses Pharyngeal Material enters airway, remains ABOVE vocal cords and not ejected out Pharyngeal- Puree -- Pharyngeal -- Pharyngeal- Mechanical Soft -- Pharyngeal -- Pharyngeal- Regular -- Pharyngeal -- Pharyngeal- Multi-consistency -- Pharyngeal -- Pharyngeal- Pill --  Pharyngeal -- Pharyngeal Comment --  CHL IP CERVICAL ESOPHAGEAL PHASE 06/29/2019 Cervical Esophageal Phase (No Data) Pudding Teaspoon -- Pudding Cup -- Honey Teaspoon -- Honey Cup -- Nectar Teaspoon -- Nectar Cup -- Nectar Straw -- Thin Teaspoon -- Thin Cup -- Thin Straw -- Puree -- Mechanical Soft -- Regular -- Multi-consistency -- Pill -- Cervical Esophageal Comment -- Shanika I. Vear Clock, MS, CCC-SLP Acute Rehabilitation Services Office number [phone] Pager [phone] [name] 07/05/2019, 12:58 PM              ECHOCARDIOGRAM COMPLETE  Result Date: 06/25/2019    ECHOCARDIOGRAM REPORT   Patient Name:   Ido E Milson Date of Exam: 06/25/2019 Medical Rec #:  [id number]      Height:       68.0 in Accession #:    [id number]     Weight:       287.7 lb Date of Birth:  May 23, 1940       BSA:          2.385 m Patient Age:    78 years       BP:           123/67 mmHg Patient Gender: M              HR:           80 bpm. Exam Location:  Inpatient Procedure: 2D Echo and Intracardiac Opacification Agent Indications:    Atrial Fibrillation I48.91  History:        Patient has prior history of Echocardiogram examinations, most                 recent 05/18/2018. Ischemic Cardiomyopathy, CAD, Pacemaker; Risk                 Factors:Hypertension, Dyslipidemia and Diabetes.  Sonographer:    Thurman Coyer RDCS (AE) Referring Phys: [id number] Aliene Beams  Sonographer Comments: Echo performed with patient supine and on artificial respirator and patient is morbidly obese. IMPRESSIONS  1. Technically difficult; definity used; inferoseptal, inferolateral and apical hypokinesis; overall moderate LV dysfunction; mild MR.  2. Left ventricular ejection fraction, by estimation, is 35 to 40%. The left ventricle has moderately decreased function. The left ventricle demonstrates regional wall motion abnormalities (see scoring diagram/findings for description). The left ventricular internal cavity size was mildly dilated. Left ventricular diastolic parameters are indeterminate.  3. Right ventricular systolic function is normal. The right ventricular size is normal. There is moderately elevated pulmonary artery systolic pressure.  4. The mitral valve is normal in structure. Mild mitral valve regurgitation. No evidence of mitral stenosis.  5. The aortic valve is tricuspid. Aortic valve regurgitation is not visualized. Mild aortic valve sclerosis is present, with no evidence of aortic valve stenosis.  6. The inferior vena cava is dilated in size with <50% respiratory variability, suggesting right atrial pressure of 15 mmHg. FINDINGS  Left Ventricle: Left ventricular ejection fraction, by estimation, is 35 to 40%. The left ventricle has moderately decreased function. The left ventricle demonstrates regional wall motion  abnormalities. Definity contrast agent was given IV to delineate the left ventricular endocardial borders. The left ventricular internal cavity size was mildly dilated. There is no left ventricular hypertrophy. Left ventricular diastolic parameters are indeterminate. Right Ventricle: The right ventricular size is normal. Right ventricular systolic function is normal. There is moderately elevated pulmonary artery systolic pressure. The tricuspid regurgitant velocity is 3.09 m/s, and with an assumed right atrial pressure of 15 mmHg, the  estimated right ventricular systolic pressure is 53.2 mmHg. Left Atrium: Left atrial size was normal in size. Right Atrium: Right atrial size was normal in size. Pericardium: There is no evidence of pericardial effusion. Mitral Valve: The mitral valve is normal in structure. Normal mobility of the mitral valve leaflets. Mild mitral annular calcification. Mild mitral valve regurgitation. No evidence of mitral valve stenosis. Tricuspid Valve: The tricuspid valve is normal in structure. Tricuspid valve regurgitation is trivial. No evidence of tricuspid stenosis. Aortic Valve: The aortic valve is tricuspid. Aortic valve regurgitation is not visualized. Mild aortic valve sclerosis is present, with no evidence of aortic valve stenosis. Pulmonic Valve: The pulmonic valve was not well visualized. Pulmonic valve regurgitation is not visualized. No evidence of pulmonic stenosis. Aorta: The aortic root is normal in size and structure. Venous: The inferior vena cava is dilated in size with less than 50% respiratory variability, suggesting right atrial pressure of 15 mmHg. IAS/Shunts: No atrial level shunt detected by color flow Doppler. Additional Comments: Technically difficult; definity used; inferoseptal, inferolateral and apical hypokinesis; overall moderate LV dysfunction; mild MR. A pacer wire is visualized.  LEFT VENTRICLE PLAX 2D LVIDd:         5.60 cm  Diastology LVIDs:         4.50 cm   LV e' lateral: 9.36 cm/s LV PW:         1.10 cm  LV e' medial:  9.14 cm/s LV IVS:        1.10 cm LVOT diam:     2.40 cm LV SV:         105 LV SV Index:   44 LVOT Area:     4.52 cm  RIGHT VENTRICLE RV S prime:     7.94 cm/s TAPSE (M-mode): 1.6 cm LEFT ATRIUM             Index       RIGHT ATRIUM           Index LA diam:        3.70 cm 1.55 cm/m  RA Area:     16.90 cm LA Vol (A2C):   51.5 ml 21.59 ml/m RA Volume:   37.00 ml  15.51 ml/m LA Vol (A4C):   50.1 ml 21.00 ml/m LA Biplane Vol: 51.6 ml 21.63 ml/m  AORTIC VALVE LVOT Vmax:   115.00 cm/s LVOT Vmean:  67.800 cm/s LVOT VTI:    0.233 m  AORTA Ao Root diam: 3.30 cm TRICUSPID VALVE TR Peak grad:   38.2 mmHg TR Vmax:        309.00 cm/s  SHUNTS Systemic VTI:  0.23 m Systemic Diam: 2.40 cm Olga Millers MD Electronically signed by Olga Millers MD Signature Date/Time: 06/25/2019/12:22:15 PM    Final    [place] EKG SITE RITE  Result Date: 06/26/2019 If Site Rite image not attached, placement could not be confirmed due to current cardiac rhythm.     Discharge Exam: Vitals:   07/06/19 0730 07/06/19 1046  BP: (!) 117/56 140/68  Pulse: 78 78  Resp: 20 15  Temp: 98.3 F (36.8 C) 97.6 F (36.4 C)  SpO2: 95%     GENERAL: No acute distress.  Appears well.  HEENT: MMM.  Vision and hearing grossly intact.  NECK: Supple.  No apparent JVD but limited exam due to body habitus.Marland Kitchen  RESP: Saturating in low 90s on RA at rest.  No IWOB.  Fair aeration but limited exam due to body habitus. CVS:  RRR.  Heart sounds normal.  ABD/GI/GU: Bowel sounds present. Soft. Non tender.  MSK/EXT:  Moves extremities. No apparent deformity.  Trace edema bilaterally SKIN: no apparent skin lesion or wound NEURO: Awake, alert and oriented appropriately.  No apparent focal neuro deficit. PSYCH: Calm. Normal affect.   The results of significant diagnostics from this hospitalization (including imaging, microbiology, ancillary and laboratory) are listed below for reference.      Microbiology: Recent Results (from the past 240 hour(s))  SARS CORONAVIRUS 2 (TAT 6-24 HRS) Nasopharyngeal Nasopharyngeal Swab     Status: None   Collection Time: 07/05/19  1:07 PM   Specimen: Nasopharyngeal Swab  Result Value Ref Range Status   SARS Coronavirus 2 NEGATIVE NEGATIVE Final    Comment: (NOTE) SARS-CoV-2 target nucleic acids are NOT DETECTED. The SARS-CoV-2 RNA is generally detectable in upper and lower respiratory specimens during the acute phase of infection. Negative results do not preclude SARS-CoV-2 infection, do not rule out co-infections with other pathogens, and should not be used as the sole basis for treatment or other patient management decisions. Negative results must be combined with clinical observations, patient history, and epidemiological information. The expected result is Negative. Fact Sheet for Patients: SugarRoll.be Fact Sheet for Healthcare Providers: https://www.woods-mathews.com/ This test is not yet approved or cleared by the [place] FDA and  has been authorized for detection and/or diagnosis of SARS-CoV-2 by FDA under an Emergency Use Authorization (EUA). This EUA will remain  in effect (meaning this test can be used) for the duration of the COVID-19 declaration under Section 56 4(b)(1) of the Act, 21 U.S.C. section 360bbb-3(b)(1), unless the authorization is terminated or revoked sooner. Performed at [facility], [place] [address], [place], [place] [address]      Labs: BNP (last 3 results) Recent Labs    07/03/19 0530 07/04/19 0550 07/05/19 0342  BNP 824.4* 553.2* 829.9*   Basic Metabolic Panel: Recent Labs  Lab 07/02/19 0436 07/03/19 0530 07/04/19 0550 07/05/19 0341 07/06/19 0257  NA 151* 148* 142 140 138  K 4.1 3.5 3.6 3.8 4.2  CL 109 106 100 99 98  CO2 32 33* 33* 34* 32  GLUCOSE 99 108* 118* 198* 183*  BUN 36* 32* 22 21 23   CREATININE 1.20 1.21 1.11 1.44* 1.32*   CALCIUM 9.0 9.0 8.5* 8.2* 8.4*  MG 1.8 1.5* 1.8 1.7 1.6*  PHOS  --   --   --   --  2.9   Liver Function Tests: Recent Labs  Lab 07/01/19 0511 07/06/19 0257  AST 15  --   ALT 19  --   ALKPHOS 83  --   BILITOT 0.9  --   PROT 5.6*  --   ALBUMIN 2.4* 2.3*   No results for input(s): LIPASE, AMYLASE in the last 168 hours. No results for input(s): AMMONIA in the last 168 hours. CBC: Recent Labs  Lab 07/02/19 0436 07/03/19 0530 07/04/19 0550 07/05/19 0341 07/06/19 0257  WBC 8.5 8.7 8.2 9.1 10.3  NEUTROABS 5.6 5.9 5.7 6.6  --   HGB 7.9* 8.9* 8.9* 8.8* 9.2*  HCT 27.2* 29.3* 29.2* 28.7* 29.5*  MCV 94.4 93.0 91.8 91.4 91.9  PLT 122* 117* 126* 129* 169   Cardiac Enzymes: No results for input(s): CKTOTAL, CKMB, CKMBINDEX, TROPONINI in the last 168 hours. BNP: Invalid input(s): POCBNP CBG: Recent Labs  Lab 07/05/19 1138 07/05/19 1619 07/05/19 2132 07/06/19 0556 07/06/19 1048  GLUCAP 143* 179* 186* 158* 178*   D-Dimer No results for input(s):  DDIMER in the last 72 hours. Hgb A1c No results for input(s): HGBA1C in the last 72 hours. Lipid Profile No results for input(s): CHOL, HDL, LDLCALC, TRIG, CHOLHDL, LDLDIRECT in the last 72 hours. Thyroid function studies No results for input(s): TSH, T4TOTAL, T3FREE, THYROIDAB in the last 72 hours.  Invalid input(s): FREET3 Anemia work up Recent Labs    07/05/19 1244 07/05/19 1730  VITAMINB12 483  --   FOLATE 8.5  --   FERRITIN 116  --   TIBC 266  --   IRON 35*  --   RETICCTPCT  --  1.9   Urinalysis    Component Value Date/Time   COLORURINE YELLOW 06/23/2019 2030   APPEARANCEUR CLEAR 06/23/2019 2030   LABSPEC 1.015 06/23/2019 2030   PHURINE 5.0 06/23/2019 2030   GLUCOSEU NEGATIVE 06/23/2019 2030   HGBUR NEGATIVE 06/23/2019 2030   BILIRUBINUR NEGATIVE 06/23/2019 2030   KETONESUR NEGATIVE 06/23/2019 2030   PROTEINUR 30 (A) 06/23/2019 2030   NITRITE NEGATIVE 06/23/2019 2030   LEUKOCYTESUR NEGATIVE 06/23/2019 2030    Sepsis Labs Invalid input(s): PROCALCITONIN,  WBC,  LACTICIDVEN   Time coordinating discharge: 45 minutes  SIGNED:  Almon Hercules, MD  [facility] 07/06/2019, 12:13 PM  If 7PM-7AM, please contact night-coverage [email or web address] Password [email or web address]

## 2019-07-06 NOTE — Progress Notes (Signed)
Patient refusing BiPAP at this time.

## 2019-07-06 NOTE — Progress Notes (Signed)
ANTICOAGULATION CONSULT NOTE  Pharmacy Consult for Coumadin Indication: atrial fibrillation  Allergies  Allergen Reactions  . Codeine Other (See Comments)    constipation  . Erythromycin-Sulfisoxazole Other (See Comments)    Causes infection in throat and eyes    Patient Measurements: Height: 5\' 8"  (172.7 cm) Weight: 273 lb 9.5 oz (124.1 kg) IBW/kg (Calculated) : 68.4  Vital Signs: Temp: 97.6 F (36.4 C) (03/19 1046) Temp Source: Oral (03/19 1046) BP: 140/68 (03/19 1046) Pulse Rate: 78 (03/19 1046)  Labs: Recent Labs    07/04/19 0550 07/04/19 0550 07/05/19 0341 07/06/19 0257  HGB 8.9*   < > 8.8* 9.2*  HCT 29.2*  --  28.7* 29.5*  PLT 126*  --  129* 169  LABPROT 28.6*  --  24.6* 24.9*  INR 2.7*  --  2.2* 2.3*  CREATININE 1.11  --  1.44* 1.32*   < > = values in this interval not displayed.    Estimated Creatinine Clearance: 59.2 mL/min (A) (by C-G formula based on SCr of 1.32 mg/dL (H)).   Medical History: Past Medical History:  Diagnosis Date  . CAD S/P percutaneous coronary angioplasty    remote PCIs in 1990's- last CFX PCI 2008  . Chronic kidney disease, stage III (moderate)   . Diabetes mellitus with complication (HCC)    with hyperglycemia and diabetic autonomic poly neuropathy  . Gastro-esophageal reflux disease with esophagitis   . Hyperlipidemia   . Hypertension   . Ischemic cardiomyopathy 01/20/2018   St Jude ICD   . Morbid obesity (HCC)   . Obstructive sleep apnea    on CPAP  . Polyneuropathy   . Primary insomnia     Assessment: 79yo male presented to [facility] ED c/o SOB, concern for pulmonary edema, tx'd to [facility] for further evaluation, to continue Coumadin for Afib. Admission INR low at 1.2.  INR today is therapeutic at 2.3 (dose [place] on 3/15 and 3/16). Hgb up slightly to 9.2, plt 169. No s/sx of bleeding documented in chart. Minimal oral intake documented.  *PTA dose = 2.5 daily per recent anti-coag visit on 2/25.  Goal of Therapy:  INR  2-3   Plan:  Warfarin 2.5 mg daily - home dose   Daily INR, check CBC in am  3/25 PharmD., BCPS Clinical Pharmacist 07/06/2019 2:59 PM

## 2019-07-06 NOTE — Plan of Care (Signed)

## 2019-07-06 NOTE — TOC Transition Note (Addendum)
Transition of Care [facility]) - CM/SW Discharge Note   Patient Details  Name: Noah [name] MRN: [id number] Date of Birth: [birth year]  [facility]) CM/SW Contact:  Terrial Rhodes, LCSWA Phone Number: 07/06/2019, 12:19 PM   Clinical Narrative:     Patient will DC to: [facility]  Anticipated DC date: 07/06/2019  Family notified: Daughter [name] by: Sharin Mons   ?  Per MD patient ready for DC to [facility] RN, patient, patient's family, and facility notified of DC. Discharge Summary sent to facility. RN given number for report to [phone], RM [phone]. DC packet on chart. Ambulance transport requested for patient.  CSW signing off.   Final next level of care: Skilled Nursing Facility Barriers to Discharge: Continued Medical Work up   Patient Goals and CMS Choice Patient states their goals for this hospitalization and ongoing recovery are:: go to short term rehab CMS Medicare.gov Compare Post Acute Care list provided to:: Patient Choice offered to / list presented to : Adult Children  Discharge Placement PASRR number recieved: 11/07/12            Patient chooses bed at: [facility] Patient to be transferred to facility by: [facility] Name of family member notified: Daughter [name] Patient and family notified of of transfer: 07/06/19  Discharge Plan and Services     Post Acute Care Choice: Skilled Nursing Facility                               Social Determinants of Health (SDOH) Interventions     Readmission Risk Interventions Readmission Risk Prevention Plan 04/25/2019 04/17/2019  Transportation Screening Complete Complete  PCP or Specialist Appt within 3-5 Days - Not Complete  HRI or Home Care Consult - Complete  Social Work Consult for Recovery Care Planning/Counseling - Complete  Palliative Care Screening - Not Complete  Medication Review Oceanographer) Complete Complete  PCP or Specialist appointment within  3-5 days of discharge Not Complete -  PCP/Specialist Appt Not Complete comments patient discharging to SNF -  HRI or Home Care Consult Not Complete -  SW Recovery Care/Counseling Consult Complete -  Palliative Care Screening Not Applicable -  Skilled Nursing Facility Complete -  Some recent data might be hidden

## 2019-07-06 NOTE — Progress Notes (Signed)
PICC line discontinued by IV team. Discharge instructions reviewed, report called to [facility] to [place].  Patient via [facility] to [place].  Wife notified patient has left the hospital.

## 2019-07-09 DIAGNOSIS — I5043 Acute on chronic combined systolic (congestive) and diastolic (congestive) heart failure: Secondary | ICD-10-CM | POA: Diagnosis not present

## 2019-07-09 DIAGNOSIS — I2510 Atherosclerotic heart disease of native coronary artery without angina pectoris: Secondary | ICD-10-CM | POA: Diagnosis not present

## 2019-07-09 DIAGNOSIS — I255 Ischemic cardiomyopathy: Secondary | ICD-10-CM | POA: Diagnosis not present

## 2019-07-09 DIAGNOSIS — N1831 Chronic kidney disease, stage 3a: Secondary | ICD-10-CM | POA: Diagnosis not present

## 2019-07-12 DIAGNOSIS — Z79899 Other long term (current) drug therapy: Secondary | ICD-10-CM | POA: Diagnosis not present

## 2019-07-12 DIAGNOSIS — R791 Abnormal coagulation profile: Secondary | ICD-10-CM | POA: Diagnosis not present

## 2019-07-12 DIAGNOSIS — D649 Anemia, unspecified: Secondary | ICD-10-CM | POA: Diagnosis not present

## 2019-07-12 DIAGNOSIS — I4891 Unspecified atrial fibrillation: Secondary | ICD-10-CM | POA: Diagnosis not present

## 2019-07-12 DIAGNOSIS — I10 Essential (primary) hypertension: Secondary | ICD-10-CM | POA: Diagnosis not present

## 2019-07-18 DIAGNOSIS — E7801 Familial hypercholesterolemia: Secondary | ICD-10-CM | POA: Diagnosis not present

## 2019-07-18 DIAGNOSIS — I482 Chronic atrial fibrillation, unspecified: Secondary | ICD-10-CM | POA: Diagnosis not present

## 2019-07-27 DIAGNOSIS — I5043 Acute on chronic combined systolic (congestive) and diastolic (congestive) heart failure: Secondary | ICD-10-CM | POA: Diagnosis not present

## 2019-07-27 DIAGNOSIS — K219 Gastro-esophageal reflux disease without esophagitis: Secondary | ICD-10-CM | POA: Diagnosis not present

## 2019-07-27 DIAGNOSIS — E1140 Type 2 diabetes mellitus with diabetic neuropathy, unspecified: Secondary | ICD-10-CM | POA: Diagnosis not present

## 2019-07-27 DIAGNOSIS — N1831 Chronic kidney disease, stage 3a: Secondary | ICD-10-CM | POA: Diagnosis not present

## 2019-07-30 DIAGNOSIS — I2510 Atherosclerotic heart disease of native coronary artery without angina pectoris: Secondary | ICD-10-CM | POA: Diagnosis not present

## 2019-07-30 DIAGNOSIS — I4811 Longstanding persistent atrial fibrillation: Secondary | ICD-10-CM | POA: Diagnosis not present

## 2019-07-30 DIAGNOSIS — E1142 Type 2 diabetes mellitus with diabetic polyneuropathy: Secondary | ICD-10-CM | POA: Diagnosis not present

## 2019-07-30 DIAGNOSIS — J9601 Acute respiratory failure with hypoxia: Secondary | ICD-10-CM | POA: Diagnosis not present

## 2019-07-30 DIAGNOSIS — D631 Anemia in chronic kidney disease: Secondary | ICD-10-CM | POA: Diagnosis not present

## 2019-07-30 DIAGNOSIS — I110 Hypertensive heart disease with heart failure: Secondary | ICD-10-CM | POA: Diagnosis not present

## 2019-07-30 DIAGNOSIS — I255 Ischemic cardiomyopathy: Secondary | ICD-10-CM | POA: Diagnosis not present

## 2019-07-30 DIAGNOSIS — I5022 Chronic systolic (congestive) heart failure: Secondary | ICD-10-CM | POA: Diagnosis not present

## 2019-07-30 DIAGNOSIS — E1122 Type 2 diabetes mellitus with diabetic chronic kidney disease: Secondary | ICD-10-CM | POA: Diagnosis not present

## 2019-07-30 DIAGNOSIS — N1831 Chronic kidney disease, stage 3a: Secondary | ICD-10-CM | POA: Diagnosis not present

## 2019-07-30 DIAGNOSIS — I5043 Acute on chronic combined systolic (congestive) and diastolic (congestive) heart failure: Secondary | ICD-10-CM | POA: Diagnosis not present

## 2019-07-30 DIAGNOSIS — G4733 Obstructive sleep apnea (adult) (pediatric): Secondary | ICD-10-CM | POA: Diagnosis not present

## 2019-07-31 DIAGNOSIS — I2510 Atherosclerotic heart disease of native coronary artery without angina pectoris: Secondary | ICD-10-CM | POA: Diagnosis not present

## 2019-07-31 DIAGNOSIS — I4811 Longstanding persistent atrial fibrillation: Secondary | ICD-10-CM | POA: Diagnosis not present

## 2019-07-31 DIAGNOSIS — J9601 Acute respiratory failure with hypoxia: Secondary | ICD-10-CM | POA: Diagnosis not present

## 2019-07-31 DIAGNOSIS — E1142 Type 2 diabetes mellitus with diabetic polyneuropathy: Secondary | ICD-10-CM | POA: Diagnosis not present

## 2019-07-31 DIAGNOSIS — I110 Hypertensive heart disease with heart failure: Secondary | ICD-10-CM | POA: Diagnosis not present

## 2019-07-31 DIAGNOSIS — E1122 Type 2 diabetes mellitus with diabetic chronic kidney disease: Secondary | ICD-10-CM | POA: Diagnosis not present

## 2019-07-31 DIAGNOSIS — I5043 Acute on chronic combined systolic (congestive) and diastolic (congestive) heart failure: Secondary | ICD-10-CM | POA: Diagnosis not present

## 2019-07-31 DIAGNOSIS — N1831 Chronic kidney disease, stage 3a: Secondary | ICD-10-CM | POA: Diagnosis not present

## 2019-07-31 DIAGNOSIS — D631 Anemia in chronic kidney disease: Secondary | ICD-10-CM | POA: Diagnosis not present

## 2019-07-31 DIAGNOSIS — I255 Ischemic cardiomyopathy: Secondary | ICD-10-CM | POA: Diagnosis not present

## 2019-08-02 DIAGNOSIS — I110 Hypertensive heart disease with heart failure: Secondary | ICD-10-CM | POA: Diagnosis not present

## 2019-08-02 DIAGNOSIS — N1831 Chronic kidney disease, stage 3a: Secondary | ICD-10-CM | POA: Diagnosis not present

## 2019-08-02 DIAGNOSIS — I255 Ischemic cardiomyopathy: Secondary | ICD-10-CM | POA: Diagnosis not present

## 2019-08-02 DIAGNOSIS — I5043 Acute on chronic combined systolic (congestive) and diastolic (congestive) heart failure: Secondary | ICD-10-CM | POA: Diagnosis not present

## 2019-08-02 DIAGNOSIS — D631 Anemia in chronic kidney disease: Secondary | ICD-10-CM | POA: Diagnosis not present

## 2019-08-02 DIAGNOSIS — I4811 Longstanding persistent atrial fibrillation: Secondary | ICD-10-CM | POA: Diagnosis not present

## 2019-08-02 DIAGNOSIS — E1122 Type 2 diabetes mellitus with diabetic chronic kidney disease: Secondary | ICD-10-CM | POA: Diagnosis not present

## 2019-08-02 DIAGNOSIS — E1142 Type 2 diabetes mellitus with diabetic polyneuropathy: Secondary | ICD-10-CM | POA: Diagnosis not present

## 2019-08-02 DIAGNOSIS — I2510 Atherosclerotic heart disease of native coronary artery without angina pectoris: Secondary | ICD-10-CM | POA: Diagnosis not present

## 2019-08-02 DIAGNOSIS — J9601 Acute respiratory failure with hypoxia: Secondary | ICD-10-CM | POA: Diagnosis not present

## 2019-08-03 DIAGNOSIS — I255 Ischemic cardiomyopathy: Secondary | ICD-10-CM | POA: Diagnosis not present

## 2019-08-03 DIAGNOSIS — I5043 Acute on chronic combined systolic (congestive) and diastolic (congestive) heart failure: Secondary | ICD-10-CM | POA: Diagnosis not present

## 2019-08-03 DIAGNOSIS — N1831 Chronic kidney disease, stage 3a: Secondary | ICD-10-CM | POA: Diagnosis not present

## 2019-08-03 DIAGNOSIS — D631 Anemia in chronic kidney disease: Secondary | ICD-10-CM | POA: Diagnosis not present

## 2019-08-03 DIAGNOSIS — I2510 Atherosclerotic heart disease of native coronary artery without angina pectoris: Secondary | ICD-10-CM | POA: Diagnosis not present

## 2019-08-03 DIAGNOSIS — J9601 Acute respiratory failure with hypoxia: Secondary | ICD-10-CM | POA: Diagnosis not present

## 2019-08-03 DIAGNOSIS — I110 Hypertensive heart disease with heart failure: Secondary | ICD-10-CM | POA: Diagnosis not present

## 2019-08-03 DIAGNOSIS — M6281 Muscle weakness (generalized): Secondary | ICD-10-CM | POA: Diagnosis not present

## 2019-08-03 DIAGNOSIS — I4811 Longstanding persistent atrial fibrillation: Secondary | ICD-10-CM | POA: Diagnosis not present

## 2019-08-03 DIAGNOSIS — E1122 Type 2 diabetes mellitus with diabetic chronic kidney disease: Secondary | ICD-10-CM | POA: Diagnosis not present

## 2019-08-03 DIAGNOSIS — E1142 Type 2 diabetes mellitus with diabetic polyneuropathy: Secondary | ICD-10-CM | POA: Diagnosis not present

## 2019-08-06 DIAGNOSIS — I4811 Longstanding persistent atrial fibrillation: Secondary | ICD-10-CM | POA: Diagnosis not present

## 2019-08-06 DIAGNOSIS — J9601 Acute respiratory failure with hypoxia: Secondary | ICD-10-CM | POA: Diagnosis not present

## 2019-08-06 DIAGNOSIS — I2510 Atherosclerotic heart disease of native coronary artery without angina pectoris: Secondary | ICD-10-CM | POA: Diagnosis not present

## 2019-08-06 DIAGNOSIS — E1122 Type 2 diabetes mellitus with diabetic chronic kidney disease: Secondary | ICD-10-CM | POA: Diagnosis not present

## 2019-08-06 DIAGNOSIS — N1831 Chronic kidney disease, stage 3a: Secondary | ICD-10-CM | POA: Diagnosis not present

## 2019-08-06 DIAGNOSIS — I5043 Acute on chronic combined systolic (congestive) and diastolic (congestive) heart failure: Secondary | ICD-10-CM | POA: Diagnosis not present

## 2019-08-06 DIAGNOSIS — I255 Ischemic cardiomyopathy: Secondary | ICD-10-CM | POA: Diagnosis not present

## 2019-08-06 DIAGNOSIS — I110 Hypertensive heart disease with heart failure: Secondary | ICD-10-CM | POA: Diagnosis not present

## 2019-08-06 DIAGNOSIS — D631 Anemia in chronic kidney disease: Secondary | ICD-10-CM | POA: Diagnosis not present

## 2019-08-06 DIAGNOSIS — E1142 Type 2 diabetes mellitus with diabetic polyneuropathy: Secondary | ICD-10-CM | POA: Diagnosis not present

## 2019-08-07 DIAGNOSIS — N1831 Chronic kidney disease, stage 3a: Secondary | ICD-10-CM | POA: Diagnosis not present

## 2019-08-07 DIAGNOSIS — I255 Ischemic cardiomyopathy: Secondary | ICD-10-CM | POA: Diagnosis not present

## 2019-08-07 DIAGNOSIS — I2510 Atherosclerotic heart disease of native coronary artery without angina pectoris: Secondary | ICD-10-CM | POA: Diagnosis not present

## 2019-08-07 DIAGNOSIS — I4811 Longstanding persistent atrial fibrillation: Secondary | ICD-10-CM | POA: Diagnosis not present

## 2019-08-07 DIAGNOSIS — D631 Anemia in chronic kidney disease: Secondary | ICD-10-CM | POA: Diagnosis not present

## 2019-08-07 DIAGNOSIS — E1122 Type 2 diabetes mellitus with diabetic chronic kidney disease: Secondary | ICD-10-CM | POA: Diagnosis not present

## 2019-08-07 DIAGNOSIS — I5043 Acute on chronic combined systolic (congestive) and diastolic (congestive) heart failure: Secondary | ICD-10-CM | POA: Diagnosis not present

## 2019-08-07 DIAGNOSIS — J9601 Acute respiratory failure with hypoxia: Secondary | ICD-10-CM | POA: Diagnosis not present

## 2019-08-07 DIAGNOSIS — I110 Hypertensive heart disease with heart failure: Secondary | ICD-10-CM | POA: Diagnosis not present

## 2019-08-07 DIAGNOSIS — E1142 Type 2 diabetes mellitus with diabetic polyneuropathy: Secondary | ICD-10-CM | POA: Diagnosis not present

## 2019-08-08 DIAGNOSIS — D631 Anemia in chronic kidney disease: Secondary | ICD-10-CM | POA: Diagnosis not present

## 2019-08-08 DIAGNOSIS — N1831 Chronic kidney disease, stage 3a: Secondary | ICD-10-CM | POA: Diagnosis not present

## 2019-08-08 DIAGNOSIS — I110 Hypertensive heart disease with heart failure: Secondary | ICD-10-CM | POA: Diagnosis not present

## 2019-08-08 DIAGNOSIS — I5043 Acute on chronic combined systolic (congestive) and diastolic (congestive) heart failure: Secondary | ICD-10-CM | POA: Diagnosis not present

## 2019-08-08 DIAGNOSIS — I2510 Atherosclerotic heart disease of native coronary artery without angina pectoris: Secondary | ICD-10-CM | POA: Diagnosis not present

## 2019-08-08 DIAGNOSIS — I255 Ischemic cardiomyopathy: Secondary | ICD-10-CM | POA: Diagnosis not present

## 2019-08-08 DIAGNOSIS — E1122 Type 2 diabetes mellitus with diabetic chronic kidney disease: Secondary | ICD-10-CM | POA: Diagnosis not present

## 2019-08-08 DIAGNOSIS — E1142 Type 2 diabetes mellitus with diabetic polyneuropathy: Secondary | ICD-10-CM | POA: Diagnosis not present

## 2019-08-08 DIAGNOSIS — J9601 Acute respiratory failure with hypoxia: Secondary | ICD-10-CM | POA: Diagnosis not present

## 2019-08-08 DIAGNOSIS — I4811 Longstanding persistent atrial fibrillation: Secondary | ICD-10-CM | POA: Diagnosis not present

## 2019-08-10 DIAGNOSIS — J9601 Acute respiratory failure with hypoxia: Secondary | ICD-10-CM | POA: Diagnosis not present

## 2019-08-10 DIAGNOSIS — I4811 Longstanding persistent atrial fibrillation: Secondary | ICD-10-CM | POA: Diagnosis not present

## 2019-08-10 DIAGNOSIS — D631 Anemia in chronic kidney disease: Secondary | ICD-10-CM | POA: Diagnosis not present

## 2019-08-10 DIAGNOSIS — N1831 Chronic kidney disease, stage 3a: Secondary | ICD-10-CM | POA: Diagnosis not present

## 2019-08-10 DIAGNOSIS — I110 Hypertensive heart disease with heart failure: Secondary | ICD-10-CM | POA: Diagnosis not present

## 2019-08-10 DIAGNOSIS — I5043 Acute on chronic combined systolic (congestive) and diastolic (congestive) heart failure: Secondary | ICD-10-CM | POA: Diagnosis not present

## 2019-08-10 DIAGNOSIS — I2510 Atherosclerotic heart disease of native coronary artery without angina pectoris: Secondary | ICD-10-CM | POA: Diagnosis not present

## 2019-08-10 DIAGNOSIS — E1122 Type 2 diabetes mellitus with diabetic chronic kidney disease: Secondary | ICD-10-CM | POA: Diagnosis not present

## 2019-08-10 DIAGNOSIS — E1142 Type 2 diabetes mellitus with diabetic polyneuropathy: Secondary | ICD-10-CM | POA: Diagnosis not present

## 2019-08-10 DIAGNOSIS — I255 Ischemic cardiomyopathy: Secondary | ICD-10-CM | POA: Diagnosis not present

## 2019-08-13 ENCOUNTER — Ambulatory Visit (INDEPENDENT_AMBULATORY_CARE_PROVIDER_SITE_OTHER): Payer: Medicare HMO | Admitting: Cardiovascular Disease

## 2019-08-13 ENCOUNTER — Encounter: Payer: Self-pay | Admitting: Cardiovascular Disease

## 2019-08-13 ENCOUNTER — Other Ambulatory Visit: Payer: Self-pay

## 2019-08-13 VITALS — BP 112/62 | HR 72 | Ht 68.5 in | Wt 271.0 lb

## 2019-08-13 DIAGNOSIS — Z955 Presence of coronary angioplasty implant and graft: Secondary | ICD-10-CM | POA: Diagnosis not present

## 2019-08-13 DIAGNOSIS — I480 Paroxysmal atrial fibrillation: Secondary | ICD-10-CM | POA: Diagnosis not present

## 2019-08-13 DIAGNOSIS — I10 Essential (primary) hypertension: Secondary | ICD-10-CM

## 2019-08-13 DIAGNOSIS — I5042 Chronic combined systolic (congestive) and diastolic (congestive) heart failure: Secondary | ICD-10-CM | POA: Diagnosis not present

## 2019-08-13 DIAGNOSIS — I25118 Atherosclerotic heart disease of native coronary artery with other forms of angina pectoris: Secondary | ICD-10-CM

## 2019-08-13 DIAGNOSIS — Z95810 Presence of automatic (implantable) cardiac defibrillator: Secondary | ICD-10-CM | POA: Diagnosis not present

## 2019-08-13 DIAGNOSIS — N183 Chronic kidney disease, stage 3 unspecified: Secondary | ICD-10-CM | POA: Diagnosis not present

## 2019-08-13 DIAGNOSIS — E785 Hyperlipidemia, unspecified: Secondary | ICD-10-CM | POA: Diagnosis not present

## 2019-08-13 NOTE — Patient Instructions (Signed)
Medication Instructions:  Continue all current medications.  Labwork: none  Testing/Procedures: none  Follow-Up: 4 months   Any Other Special Instructions Will Be Listed Below (If Applicable).  If you need a refill on your cardiac medications before your next appointment, please call your pharmacy.\ 

## 2019-08-13 NOTE — Progress Notes (Signed)
SUBJECTIVE: The patient presents for posthospitalization follow-up.  He was hospitalized for severe hypotension and hypothermia and sepsis due to Enterococcus UTI.  He developed pulmonary edema following volume resuscitation.  He was followed by cardiology.  He underwent Saint Jude biventricular ICD implantation on 01/20/2018.  He underwent coronary angiography on 05/30/2006 and underwent percutaneous transluminal coronary angioplasty and drug-eluting stent placement to the circumflex. 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  Nuclear stress test on 10/14/2017 showed a large prior inferior myocardial infarction. There was no significant ischemia.  ABIs in June 2020 demonstrated left-sided tibial disease.  He denies chest pain or leg swelling.  He is weak but slowly regaining his strength.  He is receiving physical therapy twice per week.  Review of Systems: As per "subjective", otherwise negative.  Allergies  Allergen Reactions  . Codeine Other (See Comments)    constipation  . Erythromycin-Sulfisoxazole Other (See Comments)    Causes infection in throat and eyes    Current Outpatient Medications  Medication Sig Dispense Refill  . acetaminophen (TYLENOL) 650 MG CR tablet Take 650-1,300 mg by mouth daily as needed for pain.     . cetirizine (ZYRTEC) 10 MG tablet Take 10 mg by mouth daily.    . Continuous Blood Gluc Sensor (FREESTYLE LIBRE 14 DAY SENSOR) MISC Inject 1 each into the skin every 14 (fourteen) days. Use as directed. 2 each 2  . finasteride (PROSCAR) 5 MG tablet Take 5 mg by mouth every evening.   4  . FLUoxetine (PROZAC) 20 MG capsule Take 20 mg by mouth every evening.    . furosemide (LASIX) 40 MG tablet Take 1 tablet (40 mg total) by mouth daily. 30 tablet 2  . gabapentin (NEURONTIN) 100 MG capsule Take 100 mg by mouth 2 (two) times daily. 100 in the am 200 mg at night    . glipiZIDE  (GLUCOTROL XL) 2.5 MG 24 hr tablet Take 1 tablet (2.5 mg total) by mouth daily with breakfast. 30 tablet 3  . Insulin Glargine-Lixisenatide (SOLIQUA) 100-33 UNT-MCG/ML SOPN Inject 40 Units into the skin daily with breakfast. 5 pen 2  . levETIRAcetam (KEPPRA) 750 MG tablet Take 750 mg by mouth 2 (two) times daily.    . metoprolol succinate (TOPROL XL) 50 MG 24 hr tablet Take 1 tablet (50 mg total) by mouth daily. Take with or immediately following a meal. 30 tablet 3  . Omega-3 Fatty Acids (FISH OIL) 1000 MG CAPS Take 1,000 mg by mouth 2 (two) times daily.     . pantoprazole (PROTONIX) 40 MG tablet Take 1 tablet (40 mg total) by mouth daily. 30 tablet 2  . polyethylene glycol powder (MIRALAX) 17 GM/SCOOP powder Take 17 g by mouth 2 (two) times daily as needed for moderate constipation. 255 g 0  . sacubitril-valsartan (ENTRESTO) 97-103 MG Take 1 tablet by mouth 2 (two) times daily. 180 tablet 1  . senna-docusate (SENOKOT-S) 8.6-50 MG tablet Take 1 tablet by mouth 2 (two) times daily between meals as needed for mild constipation. 60 tablet 0  . simvastatin (ZOCOR) 40 MG tablet Take 40 mg by mouth daily.    . Tamsulosin HCl (FLOMAX) 0.4 MG CAPS Take 0.8 mg by mouth every evening.    . warfarin (COUMADIN) 5 MG tablet TAKE 1/2 TABLET (2.5MG ) DAILY EXCEPT 1 TABLET (5MG ) ON TUESDAYS, THURSDAYS AND SATURDAYS 30 tablet 0  . Zinc  50 MG TABS Take 200 mg by mouth daily.     No current facility-administered medications for this visit.    Past Medical History:  Diagnosis Date  . CAD S/P percutaneous coronary angioplasty    remote PCIs in 1990's- last CFX PCI 2008  . Chronic kidney disease, stage III (moderate)   . Diabetes mellitus with complication (HCC)    with hyperglycemia and diabetic autonomic poly neuropathy  . Gastro-esophageal reflux disease with esophagitis   . Hyperlipidemia   . Hypertension   . Ischemic cardiomyopathy 01/20/2018   St Jude ICD   . Morbid obesity (HCC)   . Obstructive sleep  apnea    on CPAP  . Polyneuropathy   . Primary insomnia     Past Surgical History:  Procedure Laterality Date  . BIV ICD INSERTION CRT-D N/A 01/20/2018   Procedure: BIV ICD INSERTION CRT-D;  Surgeon: Marinus Maw, MD;  Location: [facility] INVASIVE CV LAB;  Service: Cardiovascular;  Laterality: N/A;  . BIV PACEMAKER INSERTION CRT-P  01/20/2018   St Jude- Dr Ladona Ridgel  . CARDIAC CATHETERIZATION     multiple angioplasty X 8, 4 stents     Social History   Socioeconomic History  . Marital status: Married    Spouse name: Not on file  . Number of children: Not on file  . Years of education: Not on file  . Highest education level: Not on file  Occupational History  . Occupation: Retired  Tobacco Use  . Smoking status: Former Smoker    Packs/day: 1.00    Years: 30.00    Pack years: 30.00    Quit date: 09/01/2000    Years since quitting: 18.9  . Smokeless tobacco: Never Used  Substance and Sexual Activity  . Alcohol use: Yes    Alcohol/week: 0.0 standard drinks  . Drug use: No  . Sexual activity: Not on file  Other Topics Concern  . Not on file  Social History Narrative  . Not on file   Social Determinants of Health   Financial Resource Strain:   . Difficulty of Paying Living Expenses:   Food Insecurity:   . Worried About Programme researcher, broadcasting/film/video in the Last Year:   . Barista in the Last Year:   Transportation Needs:   . Freight forwarder (Medical):   Marland Kitchen Lack of Transportation (Non-Medical):   Physical Activity:   . Days of Exercise per Week:   . Minutes of Exercise per Session:   Stress:   . Feeling of Stress :   Social Connections:   . Frequency of Communication with Friends and Family:   . Frequency of Social Gatherings with Friends and Family:   . Attends Religious Services:   . Active Member of Clubs or Organizations:   . Attends Banker Meetings:   Marland Kitchen Marital Status:   Intimate Partner Violence:   . Fear of Current or Ex-Partner:   .  Emotionally Abused:   Marland Kitchen Physically Abused:   . Sexually Abused:      Vitals:   08/13/19 1611  BP: 112/62  Pulse: 72  SpO2: 97%  Weight: 271 lb (122.9 kg)  Height: 5' 8.5" (1.74 m)    Wt Readings from Last 3 Encounters:  08/13/19 271 lb (122.9 kg)  07/06/19 273 lb 9.5 oz (124.1 kg)  06/01/19 270 lb 9.6 oz (122.7 kg)     PHYSICAL EXAM General: NAD HEENT: Normal. Neck: No JVD, no thyromegaly. Lungs: Clear to auscultation  bilaterally with normal respiratory effort. CV: Regular rate and rhythm, normal S1/S2, no S3/S4, no murmur. No pretibial or periankle edema.  Abdomen: Soft, nontender, no distention.  Neurologic: Alert and oriented.  Psych: Normal affect. Skin: Normal. Musculoskeletal: No gross deformities.      Labs:    Lipids: Lab Results  Component Value Date/Time   LDLCALC  09/09/2007 03:00 AM    75        Total Cholesterol/HDL:CHD Risk Coronary Heart Disease Risk Table                     Men   Women  1/2 Average Risk   3.4   3.3   CHOL  09/09/2007 03:00 AM    121        ATP III CLASSIFICATION:  <200     mg/dL   Desirable  200-239  mg/dL   Borderline High  >=240    mg/dL   High   TRIG 156 (H) 06/27/2019 04:23 AM   HDL 28 (L) 09/09/2007 03:00 AM      Cardiac Studies   ECHO3/11/2019 1. Technically difficult; definity used; inferoseptal, inferolateral and  apical hypokinesis; overall moderate LV dysfunction; mild MR.  2. Left ventricular ejection fraction, by estimation, is 35 to 40%. The  left ventricle has moderately decreased function. The left ventricle  demonstrates regional wall motion abnormalities (see scoring  diagram/findings for description). The left  ventricular internal cavity size was mildly dilated. Left ventricular  diastolic parameters are indeterminate.  3. Right ventricular systolic function is normal. The right ventricular  size is normal. There is moderately elevated pulmonary artery systolic  pressure.  4. The mitral  valve is normal in structure. Mild mitral valve  regurgitation. No evidence of mitral stenosis.  5. The aortic valve is tricuspid. Aortic valve regurgitation is not  visualized. Mild aortic valve sclerosis is present, with no evidence of  aortic valve stenosis.  6. The inferior vena cava is dilated in size with <50% respiratory  variability, suggesting right atrial pressure of 15 mmHg.     ASSESSMENT AND PLAN:  1. Coronaryarterydisease: Historyof inferior wall MI and multivessel stenting.Symptomatically stable.Currently on currently on metoprolol succinate and simvastatin.  No aspirin as he is on warfarin.  Nuclear stress test reviewed above with no evidence of ischemia and large area of inferior myocardial scar.  2. Hypertension: Blood pressure is normal.  No changes to therapy.  3. Hyperlipidemia: Continue simvastatin.  4. 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  LVEF 35 to 40% by echocardiogram in March 2021.  Continue Toprol-XL, Entresto, and Lasix.  Interrogation in March 2021 showed arrhythmia burden 30 to 44% since the week of March 5.  There were no atrial arrhythmias essentially throughout the months of gender in February.  5. Morbid obesity: He needs significant weight loss.   6. Paroxysmal atrial fibrillation: Anticoagulated with warfarin. Currently on Toprol-XL for rate control.  7. Chronic kidney disease stage III:  BUN 23, creatinine 1.32 on 07/06/2019.   Disposition: Follow up 4 months virtual visit  Time spent: 40 minutes, of which greater than 50% was spent reviewing symptoms, relevant blood tests and studies, and discussing management plan with the patient.    Kate Sable, M.D., F.A.C.C.

## 2019-08-14 ENCOUNTER — Ambulatory Visit: Payer: Medicare HMO | Admitting: "Endocrinology

## 2019-08-14 DIAGNOSIS — E1122 Type 2 diabetes mellitus with diabetic chronic kidney disease: Secondary | ICD-10-CM | POA: Diagnosis not present

## 2019-08-14 DIAGNOSIS — I110 Hypertensive heart disease with heart failure: Secondary | ICD-10-CM | POA: Diagnosis not present

## 2019-08-14 DIAGNOSIS — I5043 Acute on chronic combined systolic (congestive) and diastolic (congestive) heart failure: Secondary | ICD-10-CM | POA: Diagnosis not present

## 2019-08-14 DIAGNOSIS — I255 Ischemic cardiomyopathy: Secondary | ICD-10-CM | POA: Diagnosis not present

## 2019-08-14 DIAGNOSIS — E1142 Type 2 diabetes mellitus with diabetic polyneuropathy: Secondary | ICD-10-CM | POA: Diagnosis not present

## 2019-08-14 DIAGNOSIS — D631 Anemia in chronic kidney disease: Secondary | ICD-10-CM | POA: Diagnosis not present

## 2019-08-14 DIAGNOSIS — I4811 Longstanding persistent atrial fibrillation: Secondary | ICD-10-CM | POA: Diagnosis not present

## 2019-08-14 DIAGNOSIS — I2510 Atherosclerotic heart disease of native coronary artery without angina pectoris: Secondary | ICD-10-CM | POA: Diagnosis not present

## 2019-08-14 DIAGNOSIS — N1831 Chronic kidney disease, stage 3a: Secondary | ICD-10-CM | POA: Diagnosis not present

## 2019-08-14 DIAGNOSIS — J9601 Acute respiratory failure with hypoxia: Secondary | ICD-10-CM | POA: Diagnosis not present

## 2019-08-16 ENCOUNTER — Encounter: Payer: Self-pay | Admitting: *Deleted

## 2019-08-16 ENCOUNTER — Ambulatory Visit (INDEPENDENT_AMBULATORY_CARE_PROVIDER_SITE_OTHER): Payer: Medicare HMO | Admitting: *Deleted

## 2019-08-16 DIAGNOSIS — I5043 Acute on chronic combined systolic (congestive) and diastolic (congestive) heart failure: Secondary | ICD-10-CM | POA: Diagnosis not present

## 2019-08-16 DIAGNOSIS — D631 Anemia in chronic kidney disease: Secondary | ICD-10-CM | POA: Diagnosis not present

## 2019-08-16 DIAGNOSIS — I4811 Longstanding persistent atrial fibrillation: Secondary | ICD-10-CM | POA: Diagnosis not present

## 2019-08-16 DIAGNOSIS — E1142 Type 2 diabetes mellitus with diabetic polyneuropathy: Secondary | ICD-10-CM | POA: Diagnosis not present

## 2019-08-16 DIAGNOSIS — I110 Hypertensive heart disease with heart failure: Secondary | ICD-10-CM | POA: Diagnosis not present

## 2019-08-16 DIAGNOSIS — N1831 Chronic kidney disease, stage 3a: Secondary | ICD-10-CM | POA: Diagnosis not present

## 2019-08-16 DIAGNOSIS — E1122 Type 2 diabetes mellitus with diabetic chronic kidney disease: Secondary | ICD-10-CM | POA: Diagnosis not present

## 2019-08-16 DIAGNOSIS — I2510 Atherosclerotic heart disease of native coronary artery without angina pectoris: Secondary | ICD-10-CM | POA: Diagnosis not present

## 2019-08-16 DIAGNOSIS — I480 Paroxysmal atrial fibrillation: Secondary | ICD-10-CM

## 2019-08-16 DIAGNOSIS — I255 Ischemic cardiomyopathy: Secondary | ICD-10-CM | POA: Diagnosis not present

## 2019-08-16 DIAGNOSIS — J9601 Acute respiratory failure with hypoxia: Secondary | ICD-10-CM | POA: Diagnosis not present

## 2019-08-16 LAB — CUP PACEART REMOTE DEVICE CHECK
Battery Remaining Longevity: 64 mo
Battery Remaining Percentage: 75 %
Battery Voltage: 2.96 V
Brady Statistic AP VP Percent: 5.6 %
Brady Statistic AP VS Percent: 1 %
Brady Statistic AS VP Percent: 93 %
Brady Statistic AS VS Percent: 1 %
Brady Statistic RA Percent Paced: 4.2 %
Date Time Interrogation Session: 20210429040016
HighPow Impedance: 87 Ohm
HighPow Impedance: 87 Ohm
Implantable Lead Implant Date: 20191004
Implantable Lead Implant Date: 20191004
Implantable Lead Implant Date: 20191004
Implantable Lead Location: 753858
Implantable Lead Location: 753859
Implantable Lead Location: 753860
Implantable Lead Model: 7122
Implantable Pulse Generator Implant Date: 20191004
Lead Channel Impedance Value: 430 Ohm
Lead Channel Impedance Value: 440 Ohm
Lead Channel Impedance Value: 810 Ohm
Lead Channel Pacing Threshold Amplitude: 0.5 V
Lead Channel Pacing Threshold Amplitude: 0.75 V
Lead Channel Pacing Threshold Amplitude: 1.75 V
Lead Channel Pacing Threshold Pulse Width: 0.5 ms
Lead Channel Pacing Threshold Pulse Width: 0.5 ms
Lead Channel Pacing Threshold Pulse Width: 0.7 ms
Lead Channel Sensing Intrinsic Amplitude: 12 mV
Lead Channel Sensing Intrinsic Amplitude: 3.1 mV
Lead Channel Setting Pacing Amplitude: 2 V
Lead Channel Setting Pacing Amplitude: 2 V
Lead Channel Setting Pacing Amplitude: 2.25 V
Lead Channel Setting Pacing Pulse Width: 0.5 ms
Lead Channel Setting Pacing Pulse Width: 0.7 ms
Lead Channel Setting Sensing Sensitivity: 0.5 mV
Pulse Gen Serial Number: 9810994

## 2019-08-17 DIAGNOSIS — E7849 Other hyperlipidemia: Secondary | ICD-10-CM | POA: Diagnosis not present

## 2019-08-17 DIAGNOSIS — I10 Essential (primary) hypertension: Secondary | ICD-10-CM | POA: Diagnosis not present

## 2019-08-17 NOTE — Progress Notes (Signed)
ICD Remote  

## 2019-08-21 DIAGNOSIS — E1142 Type 2 diabetes mellitus with diabetic polyneuropathy: Secondary | ICD-10-CM | POA: Diagnosis not present

## 2019-08-21 DIAGNOSIS — I5043 Acute on chronic combined systolic (congestive) and diastolic (congestive) heart failure: Secondary | ICD-10-CM | POA: Diagnosis not present

## 2019-08-21 DIAGNOSIS — N1831 Chronic kidney disease, stage 3a: Secondary | ICD-10-CM | POA: Diagnosis not present

## 2019-08-21 DIAGNOSIS — I4811 Longstanding persistent atrial fibrillation: Secondary | ICD-10-CM | POA: Diagnosis not present

## 2019-08-21 DIAGNOSIS — I255 Ischemic cardiomyopathy: Secondary | ICD-10-CM | POA: Diagnosis not present

## 2019-08-21 DIAGNOSIS — I110 Hypertensive heart disease with heart failure: Secondary | ICD-10-CM | POA: Diagnosis not present

## 2019-08-21 DIAGNOSIS — D631 Anemia in chronic kidney disease: Secondary | ICD-10-CM | POA: Diagnosis not present

## 2019-08-21 DIAGNOSIS — I2510 Atherosclerotic heart disease of native coronary artery without angina pectoris: Secondary | ICD-10-CM | POA: Diagnosis not present

## 2019-08-21 DIAGNOSIS — J9601 Acute respiratory failure with hypoxia: Secondary | ICD-10-CM | POA: Diagnosis not present

## 2019-08-21 DIAGNOSIS — E1122 Type 2 diabetes mellitus with diabetic chronic kidney disease: Secondary | ICD-10-CM | POA: Diagnosis not present

## 2019-08-23 ENCOUNTER — Ambulatory Visit (INDEPENDENT_AMBULATORY_CARE_PROVIDER_SITE_OTHER): Payer: Medicare HMO | Admitting: *Deleted

## 2019-08-23 ENCOUNTER — Other Ambulatory Visit: Payer: Self-pay

## 2019-08-23 DIAGNOSIS — I484 Atypical atrial flutter: Secondary | ICD-10-CM

## 2019-08-23 DIAGNOSIS — I5043 Acute on chronic combined systolic (congestive) and diastolic (congestive) heart failure: Secondary | ICD-10-CM | POA: Diagnosis not present

## 2019-08-23 DIAGNOSIS — Z5181 Encounter for therapeutic drug level monitoring: Secondary | ICD-10-CM

## 2019-08-23 DIAGNOSIS — D631 Anemia in chronic kidney disease: Secondary | ICD-10-CM | POA: Diagnosis not present

## 2019-08-23 DIAGNOSIS — E1122 Type 2 diabetes mellitus with diabetic chronic kidney disease: Secondary | ICD-10-CM | POA: Diagnosis not present

## 2019-08-23 DIAGNOSIS — I2510 Atherosclerotic heart disease of native coronary artery without angina pectoris: Secondary | ICD-10-CM | POA: Diagnosis not present

## 2019-08-23 DIAGNOSIS — I255 Ischemic cardiomyopathy: Secondary | ICD-10-CM

## 2019-08-23 DIAGNOSIS — I4891 Unspecified atrial fibrillation: Secondary | ICD-10-CM

## 2019-08-23 DIAGNOSIS — J9601 Acute respiratory failure with hypoxia: Secondary | ICD-10-CM | POA: Diagnosis not present

## 2019-08-23 DIAGNOSIS — E1142 Type 2 diabetes mellitus with diabetic polyneuropathy: Secondary | ICD-10-CM | POA: Diagnosis not present

## 2019-08-23 DIAGNOSIS — I110 Hypertensive heart disease with heart failure: Secondary | ICD-10-CM | POA: Diagnosis not present

## 2019-08-23 DIAGNOSIS — I480 Paroxysmal atrial fibrillation: Secondary | ICD-10-CM

## 2019-08-23 DIAGNOSIS — I4811 Longstanding persistent atrial fibrillation: Secondary | ICD-10-CM | POA: Diagnosis not present

## 2019-08-23 DIAGNOSIS — N1831 Chronic kidney disease, stage 3a: Secondary | ICD-10-CM | POA: Diagnosis not present

## 2019-08-23 LAB — POCT INR: INR: 4.7 — AB (ref 2.0–3.0)

## 2019-08-23 NOTE — Patient Instructions (Signed)
INR's recently managed by Dr Neita Carp Hold warfarin x 2 days (Friday and Saturday) then resume 1/2 tablet daily  Recheck in 2 wks

## 2019-08-26 DIAGNOSIS — I4891 Unspecified atrial fibrillation: Secondary | ICD-10-CM | POA: Diagnosis not present

## 2019-08-26 DIAGNOSIS — I5022 Chronic systolic (congestive) heart failure: Secondary | ICD-10-CM | POA: Diagnosis not present

## 2019-08-26 DIAGNOSIS — I259 Chronic ischemic heart disease, unspecified: Secondary | ICD-10-CM | POA: Diagnosis not present

## 2019-08-26 DIAGNOSIS — D509 Iron deficiency anemia, unspecified: Secondary | ICD-10-CM | POA: Diagnosis not present

## 2019-08-26 DIAGNOSIS — E782 Mixed hyperlipidemia: Secondary | ICD-10-CM | POA: Diagnosis not present

## 2019-08-26 DIAGNOSIS — N183 Chronic kidney disease, stage 3 unspecified: Secondary | ICD-10-CM | POA: Diagnosis not present

## 2019-08-26 DIAGNOSIS — I10 Essential (primary) hypertension: Secondary | ICD-10-CM | POA: Diagnosis not present

## 2019-08-26 DIAGNOSIS — N401 Enlarged prostate with lower urinary tract symptoms: Secondary | ICD-10-CM | POA: Diagnosis not present

## 2019-08-27 DIAGNOSIS — I499 Cardiac arrhythmia, unspecified: Secondary | ICD-10-CM | POA: Diagnosis not present

## 2019-08-27 DIAGNOSIS — R069 Unspecified abnormalities of breathing: Secondary | ICD-10-CM | POA: Diagnosis not present

## 2019-08-27 DIAGNOSIS — I509 Heart failure, unspecified: Secondary | ICD-10-CM | POA: Diagnosis not present

## 2019-08-27 DIAGNOSIS — R404 Transient alteration of awareness: Secondary | ICD-10-CM | POA: Diagnosis not present

## 2019-08-27 DIAGNOSIS — R0600 Dyspnea, unspecified: Secondary | ICD-10-CM | POA: Diagnosis not present

## 2019-08-27 DIAGNOSIS — I469 Cardiac arrest, cause unspecified: Secondary | ICD-10-CM | POA: Diagnosis not present

## 2019-08-27 DIAGNOSIS — I959 Hypotension, unspecified: Secondary | ICD-10-CM | POA: Diagnosis not present

## 2019-08-27 DIAGNOSIS — R0902 Hypoxemia: Secondary | ICD-10-CM | POA: Diagnosis not present

## 2019-08-28 ENCOUNTER — Telehealth: Payer: Self-pay

## 2019-08-28 NOTE — Telephone Encounter (Signed)
The pt wife states the pt passed away 09/16/2019. I verified her address and told her I will order her a return kit for the monitor. I also told her I was sorry for her loss.  

## 2019-09-06 ENCOUNTER — Ambulatory Visit: Payer: Medicare HMO | Admitting: "Endocrinology

## 2019-09-18 DEATH — deceased

## 2019-11-28 ENCOUNTER — Ambulatory Visit: Payer: Medicare HMO | Admitting: Cardiovascular Disease

## 2019-12-18 ENCOUNTER — Ambulatory Visit: Payer: Medicare HMO | Admitting: Cardiovascular Disease

## 2020-07-07 IMAGING — DX DG CHEST 2V
3 series · 3 of 3 positions shown · non-contrast
Comparison: 12/11/2017

CLINICAL DATA: ICD

EXAM:
CHEST - 2 VIEW

[chest lat (1 of 2)]
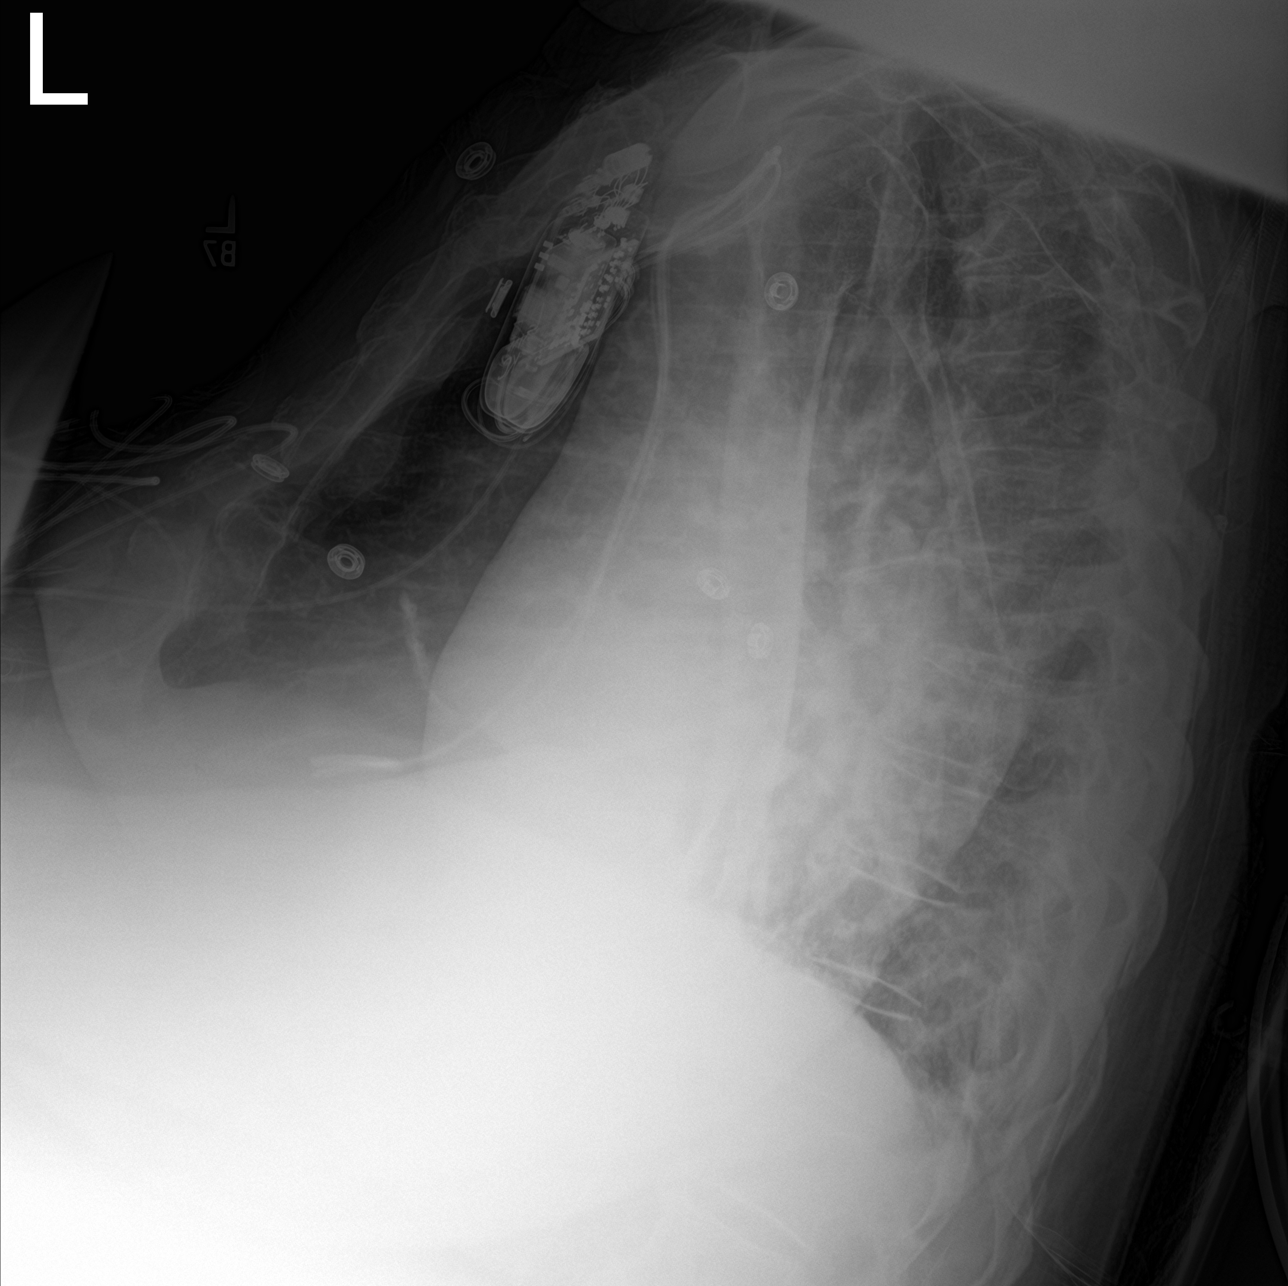

[chest ap]
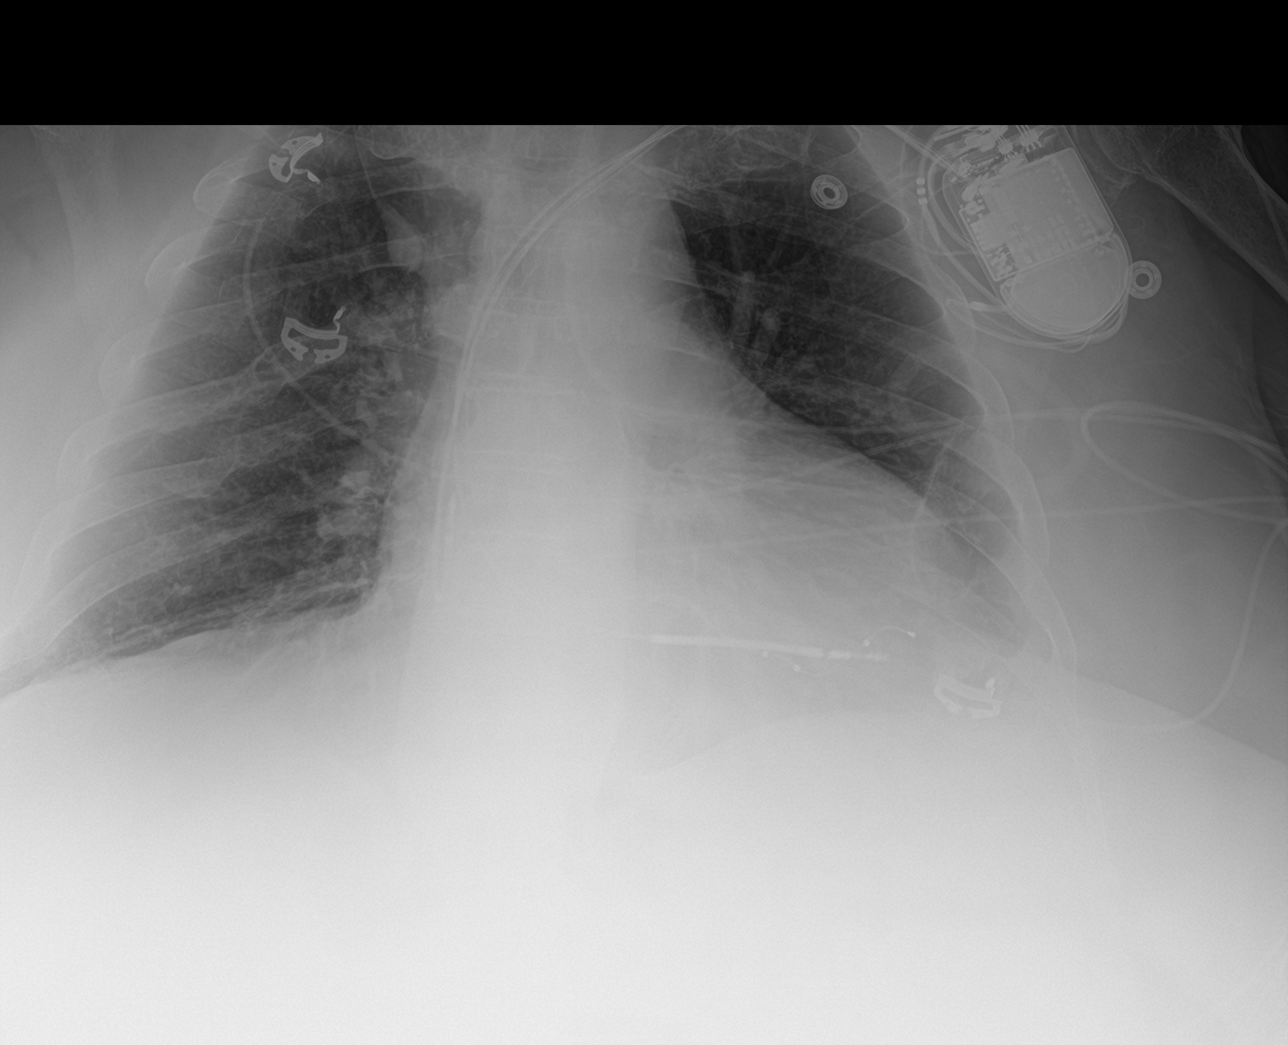

[chest lat (2 of 2)]
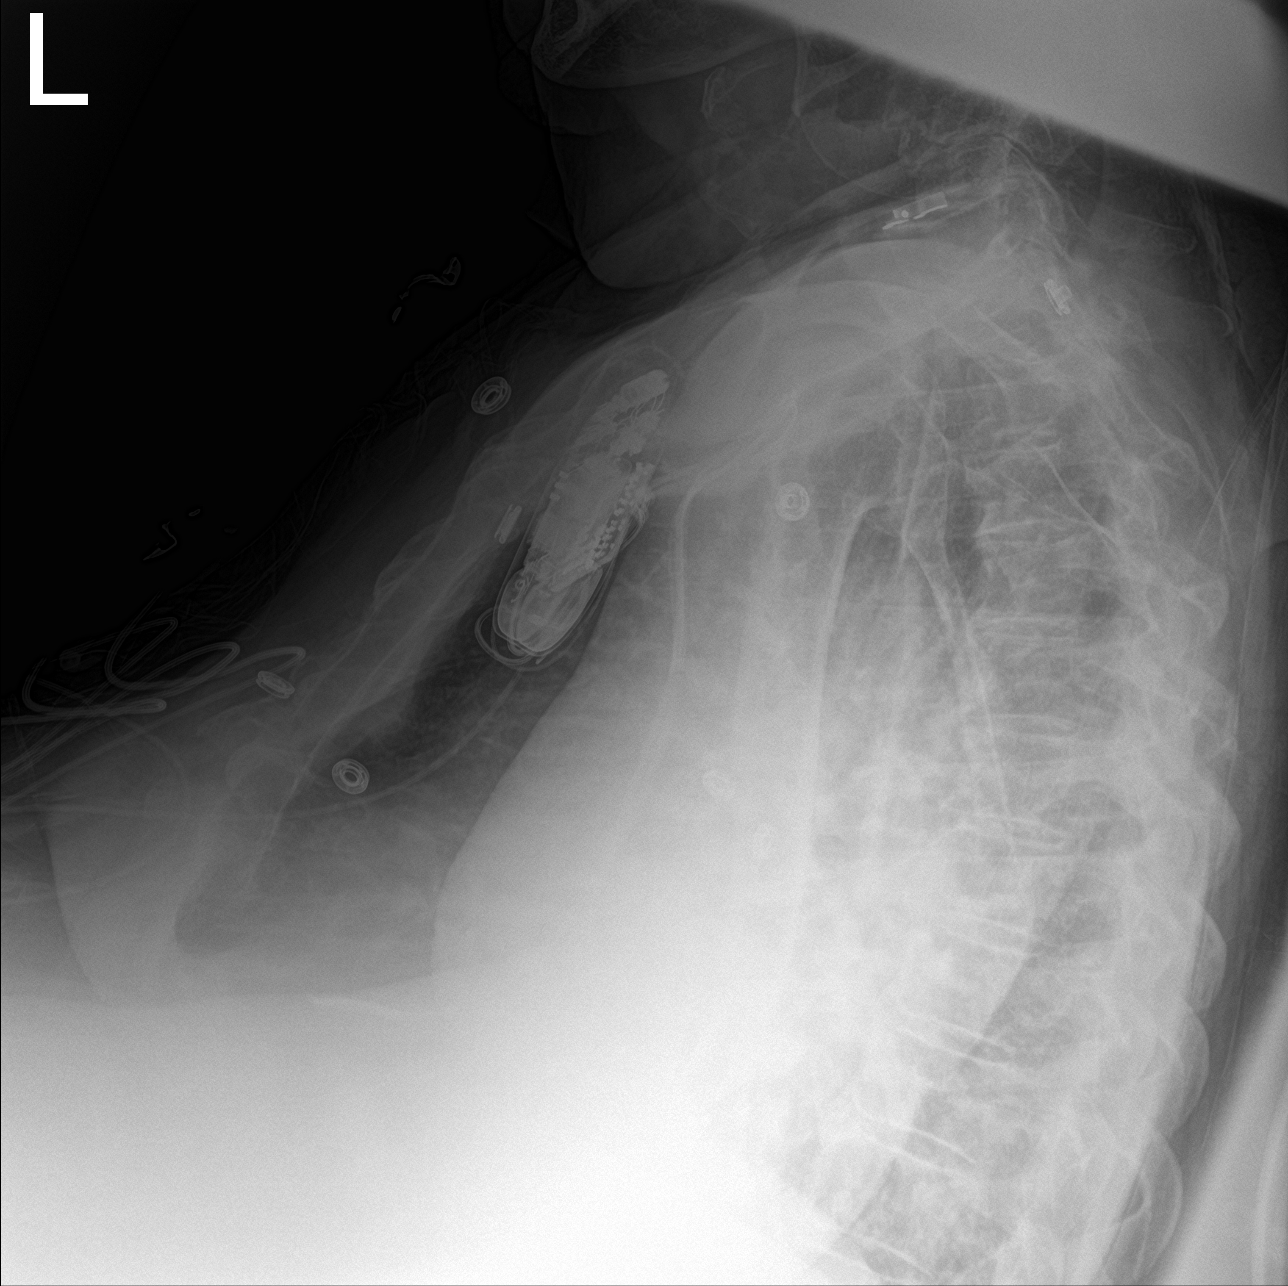

[3 of 3 positions shown; findings below may reference images not displayed]

FINDINGS: Left ICD in place with leads in the right atrium and right ventricle
as well as coronary sinus. No pneumothorax. Cardiomegaly with
vascular congestion. No confluent opacities. Small left effusion.
Lingular atelectasis or scarring.
IMPRESSION: Interval placement of left AICD as above.  No pneumothorax.

Cardiomegaly. Lingular scarring or atelectasis with small left
effusion.

## 2021-09-26 IMAGING — DX DG CHEST 1V PORT
1 series · 1 of 1 positions shown · non-contrast
Comparison: 04/10/2019

CLINICAL DATA: Shortness of breath and weakness.

EXAM:
PORTABLE CHEST 1 VIEW

[chest ap grid]
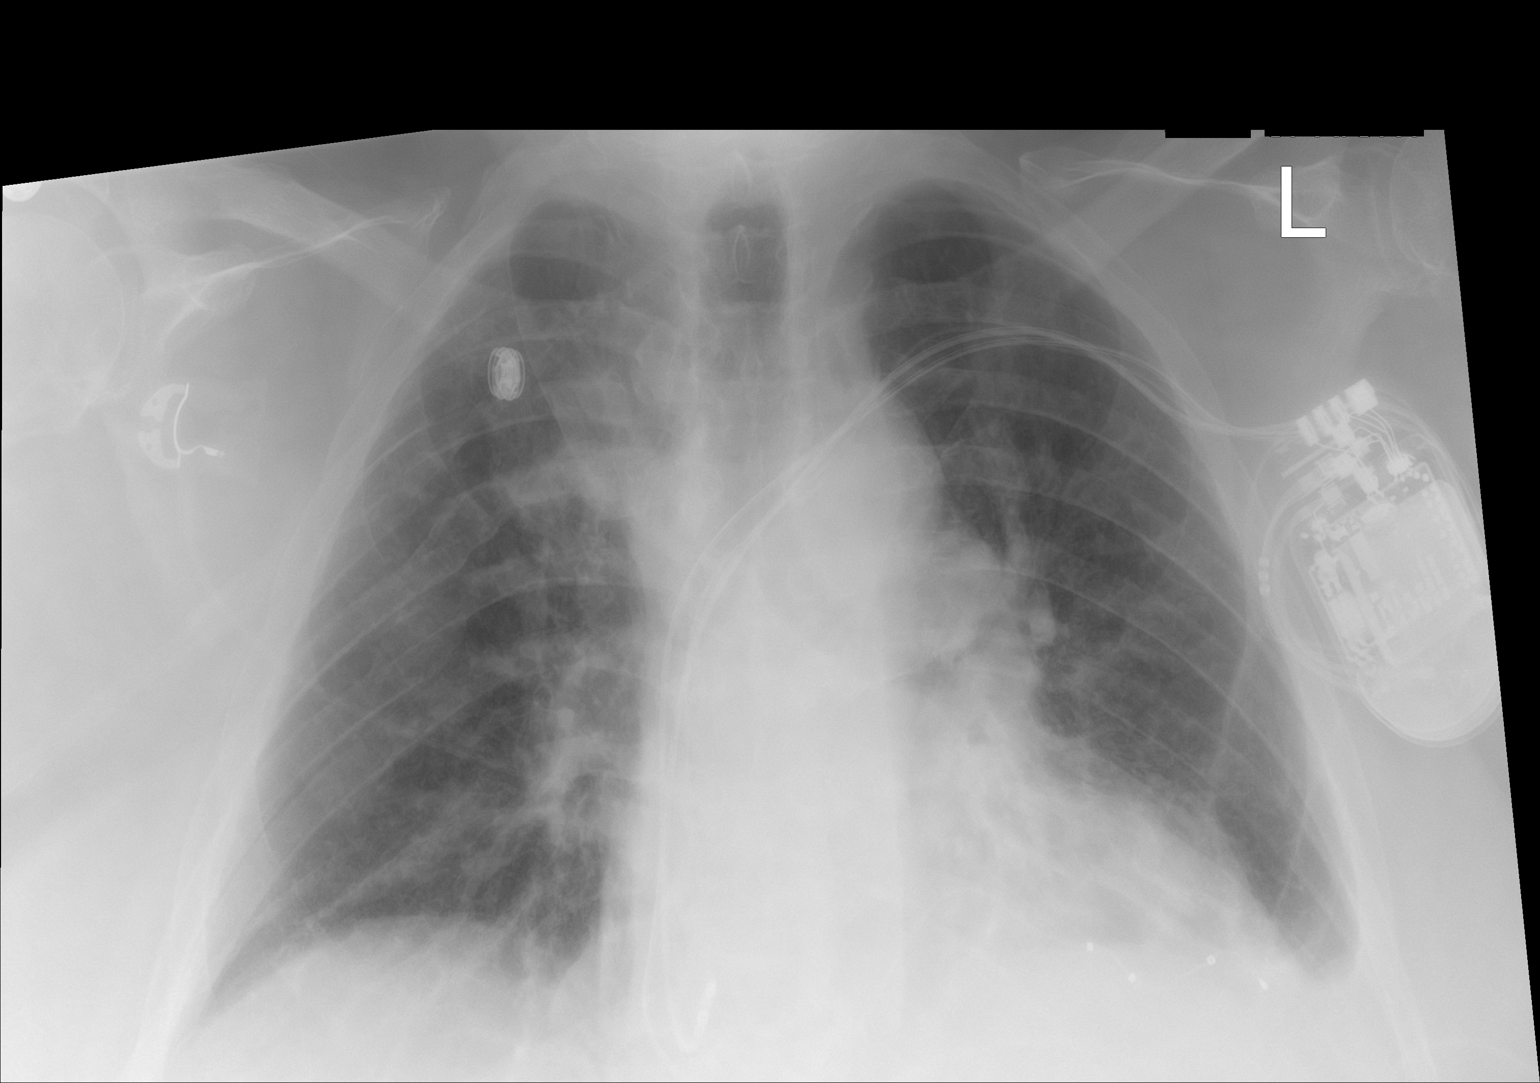

[1 of 1 positions shown; findings below may reference images not displayed]

FINDINGS: 1811 hours. The cardio pericardial silhouette is enlarged. There is
pulmonary vascular congestion without overt pulmonary edema. Patchy
retrocardiac airspace disease noted with tiny left pleural effusion.
Old posterior right rib fractures noted. Left permanent pacemaker
remains in place. Telemetry leads overlie the chest.
IMPRESSION: Cardiomegaly with left base atelectasis or infiltrate and small left
pleural effusion.

## 2021-09-30 IMAGING — US US RENAL
1 series · 14 of 25 positions shown · non-contrast
Comparison: None.

CLINICAL DATA: Acute kidney injury.

EXAM:
RENAL / URINARY TRACT ULTRASOUND COMPLETE

[Series 1: us renal · 14 of 44 slices shown]
[im 1/44]
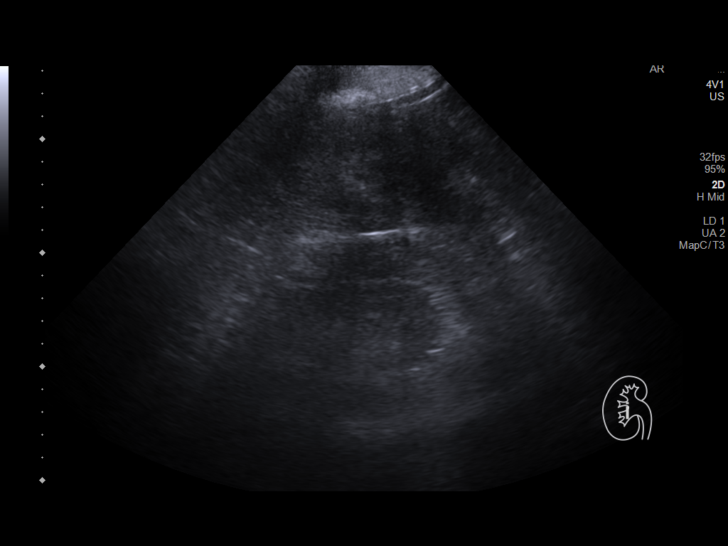
[im 4/44]
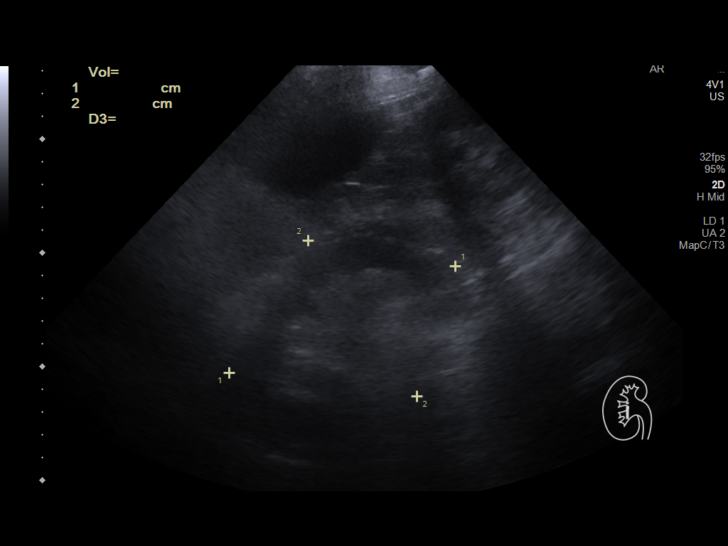
[im 8/44]
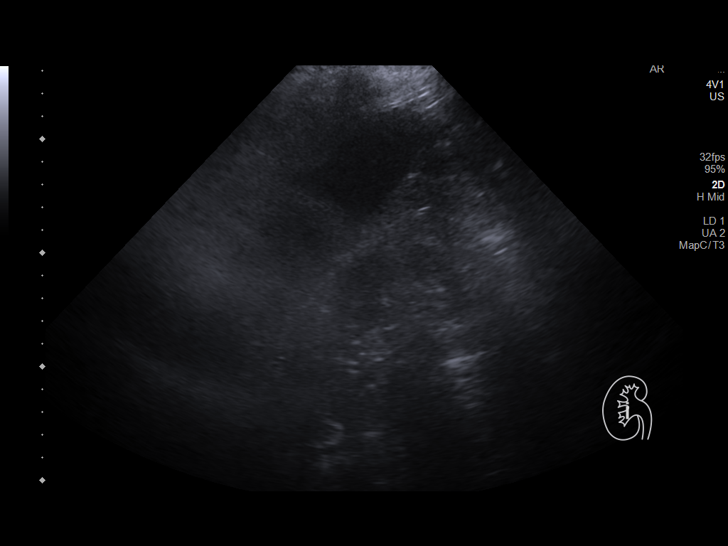
[im 11/44]
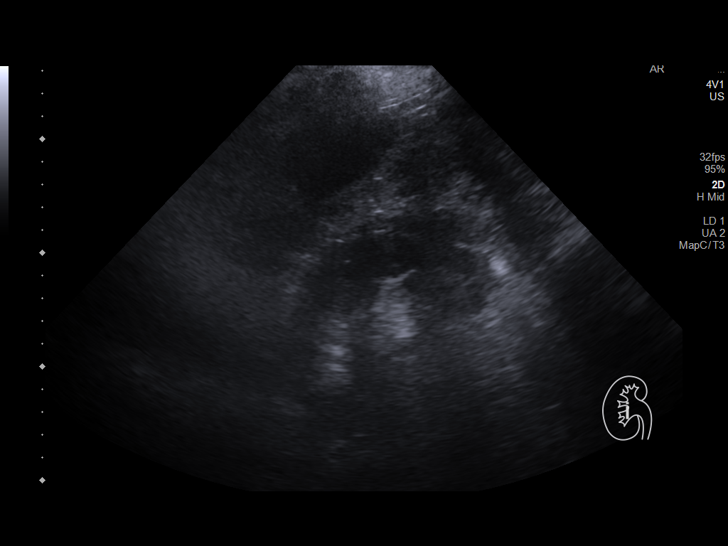
[im 15/44]
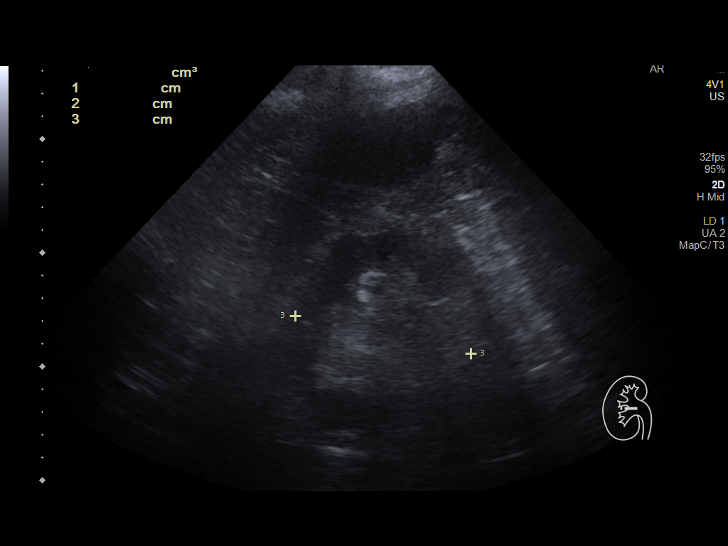
[im 17/44]
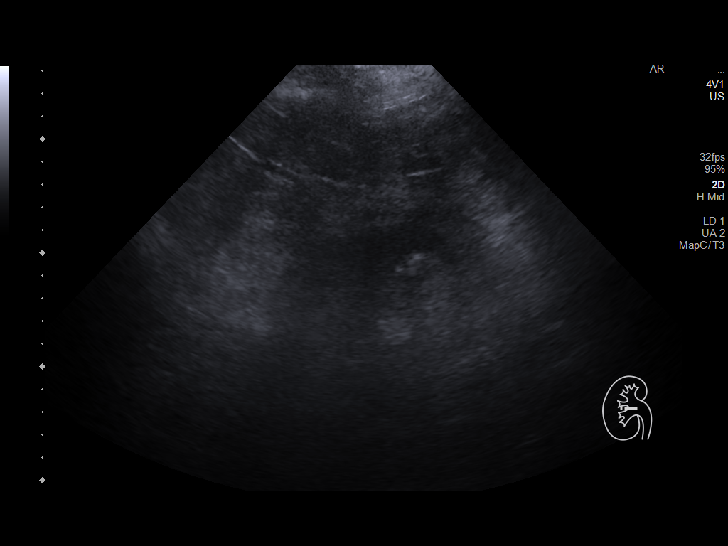
[im 20/44]
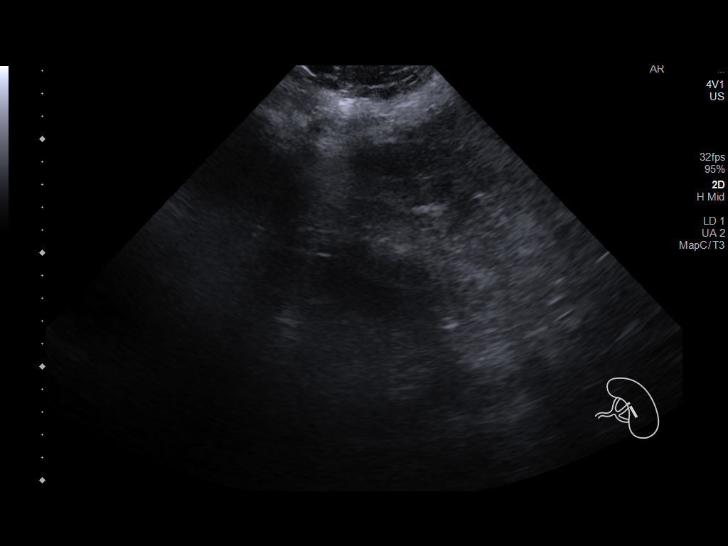
[im 24/44]
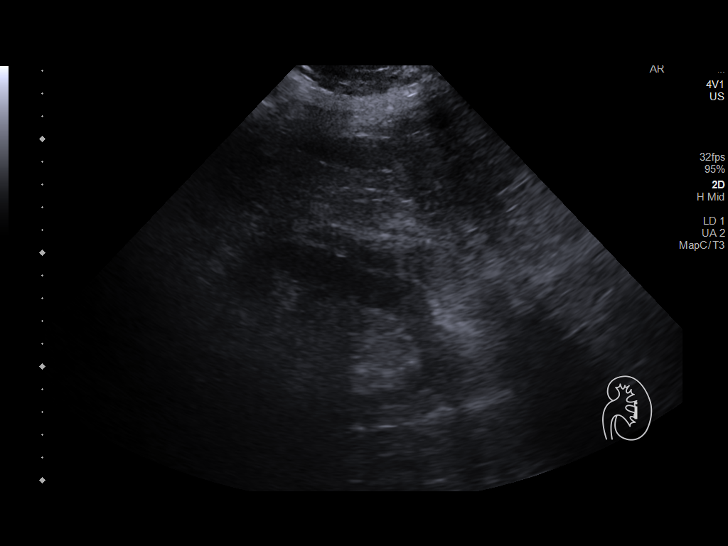
[im 27/44]
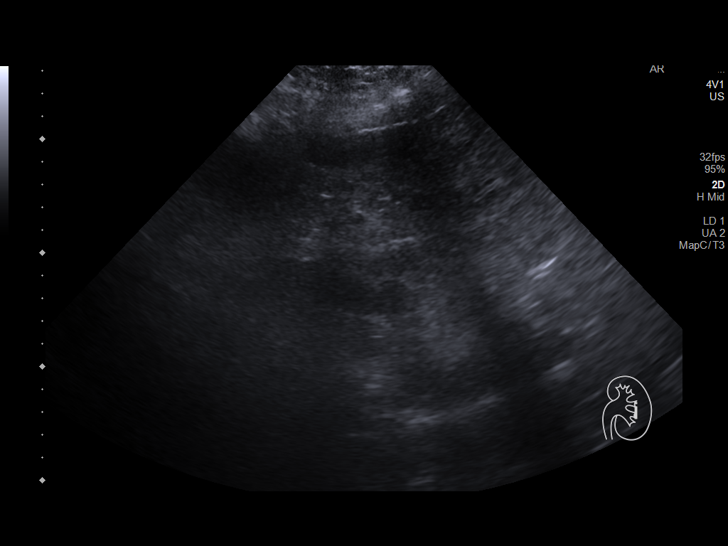
[im 29/44]
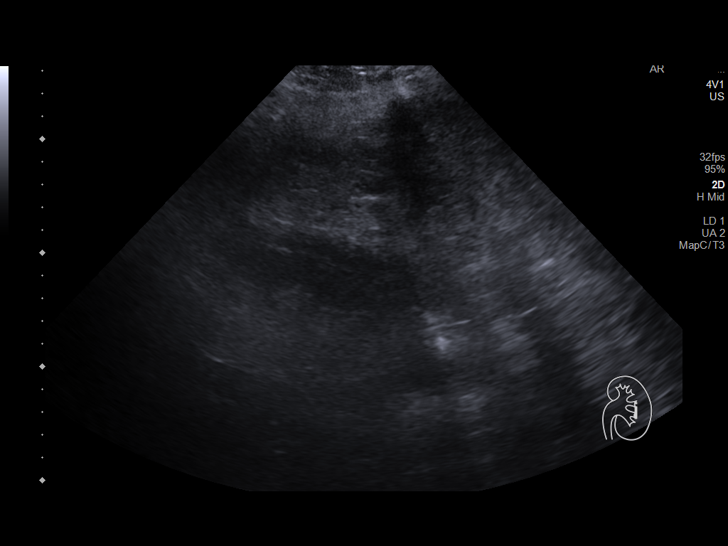
[im 33/44]
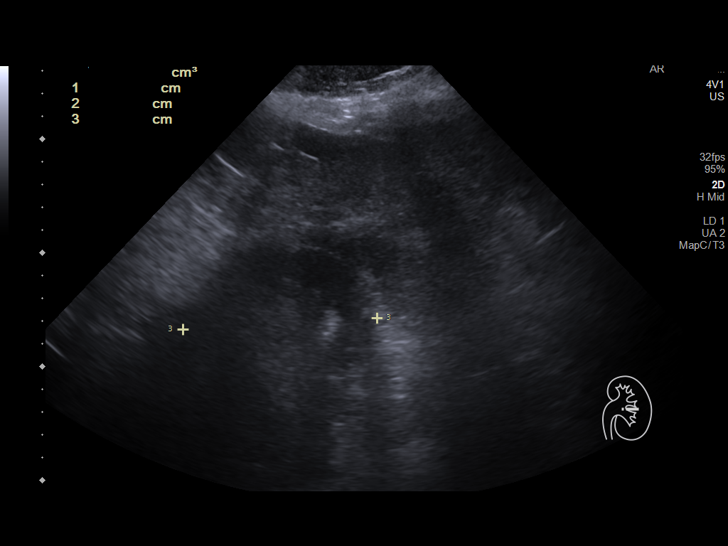
[im 36/44]
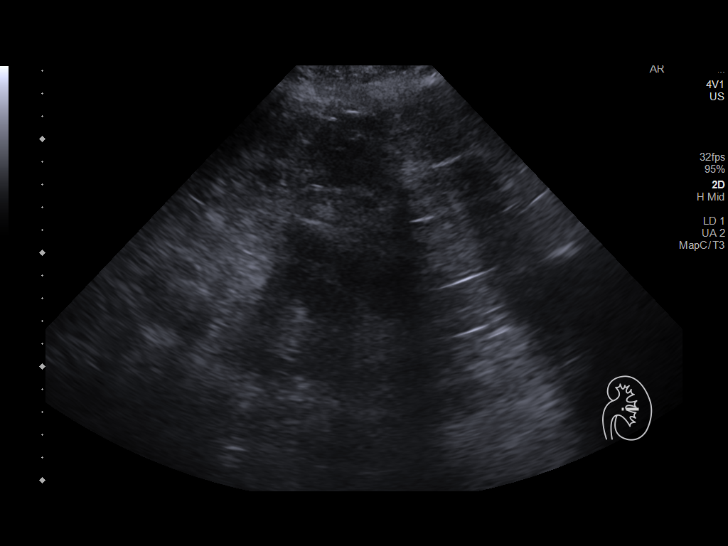
[im 40/44]
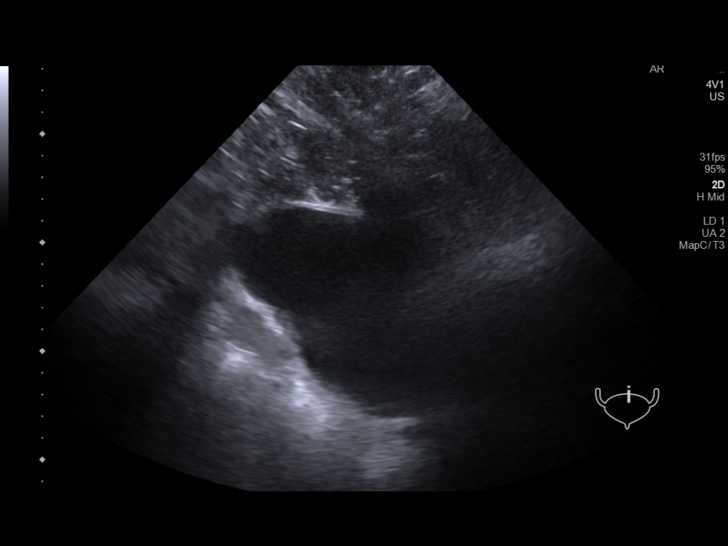
[im 44/44]
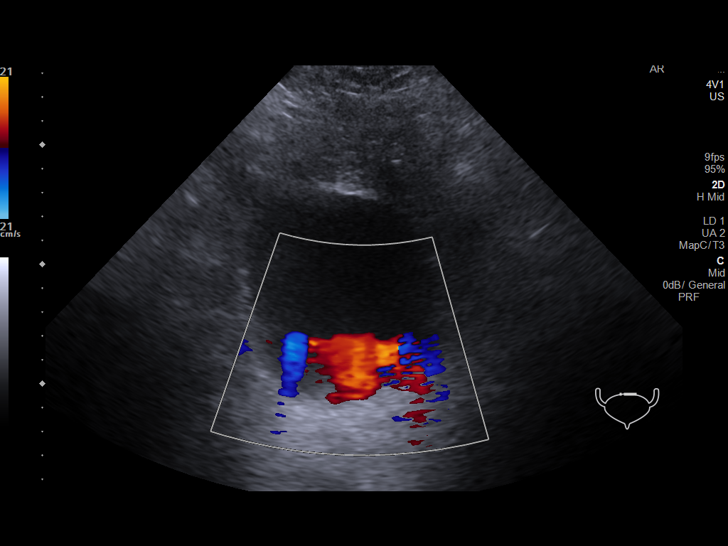

[14 of 25 positions shown; findings below may reference images not displayed]

FINDINGS: Right Kidney:

Renal measurements: 11.0 x 8.4 x 7.9 cm = volume: 379 mL .
Echogenicity within normal limits. No mass or hydronephrosis
visualized.

Left Kidney:

Renal measurements: 12.2 x 7.9 x 8.6 cm = volume: 433 mL.
Echogenicity within normal limits. No mass or hydronephrosis
visualized.

Bladder:

Bilateral ureteral jets visible.

Other:

None.
IMPRESSION: Bilateral renal enlargement by overall measurements. Large amount of
fat in the renal hilar regions, probably artificially increasing the
actual renal size. There is no hydronephrosis. There may be some
renal enlargement component due to acute nephritis or diabetic
glomerular nephropathy.
